# Patient Record
Sex: Male | Born: 1945 | Race: White | Hispanic: No | Marital: Married | Smoking: Never smoker
Health system: Southern US, Community
[De-identification: ages and names within clinical notes are randomized; demographics above are authoritative.]

## PROBLEM LIST (undated history)

## (undated) DIAGNOSIS — F32A Depression, unspecified: Secondary | ICD-10-CM

## (undated) DIAGNOSIS — C189 Malignant neoplasm of colon, unspecified: Secondary | ICD-10-CM

## (undated) DIAGNOSIS — Z978 Presence of other specified devices: Secondary | ICD-10-CM

## (undated) DIAGNOSIS — F419 Anxiety disorder, unspecified: Secondary | ICD-10-CM

## (undated) DIAGNOSIS — I499 Cardiac arrhythmia, unspecified: Secondary | ICD-10-CM

## (undated) DIAGNOSIS — F329 Major depressive disorder, single episode, unspecified: Secondary | ICD-10-CM

## (undated) DIAGNOSIS — K56609 Unspecified intestinal obstruction, unspecified as to partial versus complete obstruction: Secondary | ICD-10-CM

## (undated) DIAGNOSIS — I10 Essential (primary) hypertension: Secondary | ICD-10-CM

## (undated) DIAGNOSIS — Z87442 Personal history of urinary calculi: Secondary | ICD-10-CM

## (undated) DIAGNOSIS — I4891 Unspecified atrial fibrillation: Secondary | ICD-10-CM

## (undated) DIAGNOSIS — D649 Anemia, unspecified: Secondary | ICD-10-CM

## (undated) DIAGNOSIS — K5792 Diverticulitis of intestine, part unspecified, without perforation or abscess without bleeding: Secondary | ICD-10-CM

## (undated) HISTORY — DX: Malignant neoplasm of colon, unspecified: C18.9

## (undated) HISTORY — PX: HERNIA REPAIR: SHX51

## (undated) HISTORY — PX: ANKLE SURGERY: SHX546

## (undated) HISTORY — DX: Unspecified atrial fibrillation: I48.91

---
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
## 2005-03-28 ENCOUNTER — Emergency Department (HOSPITAL_COMMUNITY): Admission: EM | Admit: 2005-03-28 | Discharge: 2005-03-29 | Payer: Self-pay | Admitting: Emergency Medicine

## 2005-04-15 ENCOUNTER — Ambulatory Visit: Payer: Self-pay | Admitting: Gastroenterology

## 2005-04-26 ENCOUNTER — Ambulatory Visit: Payer: Self-pay | Admitting: Gastroenterology

## 2006-03-08 ENCOUNTER — Emergency Department (HOSPITAL_COMMUNITY): Admission: EM | Admit: 2006-03-08 | Discharge: 2006-03-08 | Payer: Self-pay | Admitting: Emergency Medicine

## 2014-02-25 ENCOUNTER — Telehealth: Payer: Self-pay | Admitting: Gastroenterology

## 2014-02-25 NOTE — Telephone Encounter (Signed)
Patient reports that he is having pain in his lower side that started on Saturday.  Pain radiates into his back.  He is having chills and some nausea.  He feels this may be kidney stone, but his urologist is recommending an office visit here first.  He denies diarrhea or constipation.  No APP appointments at all this week.  Dr. [name] Plan he has not been seen here since 2006.  Please advise

## 2014-02-25 NOTE — Telephone Encounter (Signed)
Patient notified of the recommendations.  

## 2014-02-25 NOTE — Telephone Encounter (Signed)
He should see his PCP, an urgent care center or ED to evaluate.

## 2015-04-21 ENCOUNTER — Encounter: Payer: Self-pay | Admitting: Gastroenterology

## 2016-06-07 HISTORY — PX: OTHER SURGICAL HISTORY: SHX169

## 2017-12-29 ENCOUNTER — Encounter (HOSPITAL_COMMUNITY): Payer: Self-pay | Admitting: Emergency Medicine

## 2017-12-29 ENCOUNTER — Emergency Department (HOSPITAL_COMMUNITY)
Admission: EM | Admit: 2017-12-29 | Discharge: 2017-12-29 | Disposition: A | Discharge status: Home / Self Care | Payer: 59 | Attending: Emergency Medicine | Admitting: Emergency Medicine

## 2017-12-29 ENCOUNTER — Other Ambulatory Visit: Payer: Self-pay

## 2017-12-29 DIAGNOSIS — R112 Nausea with vomiting, unspecified: Secondary | ICD-10-CM | POA: Insufficient documentation

## 2017-12-29 DIAGNOSIS — R197 Diarrhea, unspecified: Secondary | ICD-10-CM | POA: Insufficient documentation

## 2017-12-29 DIAGNOSIS — R1110 Vomiting, unspecified: Secondary | ICD-10-CM

## 2017-12-29 HISTORY — DX: Diverticulitis of intestine, part unspecified, without perforation or abscess without bleeding: K57.92

## 2017-12-29 LAB — CBC WITH DIFFERENTIAL/PLATELET
Basophils Absolute: 0 10*3/uL (ref 0.0–0.1)
Basophils Relative: 0 %
Eosinophils Absolute: 0 10*3/uL (ref 0.0–0.7)
Eosinophils Relative: 0 %
HCT: 48.7 % (ref 39.0–52.0)
Hemoglobin: 16.8 g/dL (ref 13.0–17.0)
Lymphocytes Relative: 20 %
Lymphs Abs: 1.3 10*3/uL (ref 0.7–4.0)
MCH: 31.2 pg (ref 26.0–34.0)
MCHC: 34.5 g/dL (ref 30.0–36.0)
MCV: 90.4 fL (ref 78.0–100.0)
Monocytes Absolute: 0.7 10*3/uL (ref 0.1–1.0)
Monocytes Relative: 10 %
Neutro Abs: 4.6 10*3/uL (ref 1.7–7.7)
Neutrophils Relative %: 70 %
Platelets: 285 10*3/uL (ref 150–400)
RBC: 5.39 MIL/uL (ref 4.22–5.81)
RDW: 12.7 % (ref 11.5–15.5)
WBC: 6.7 10*3/uL (ref 4.0–10.5)

## 2017-12-29 LAB — URINALYSIS, ROUTINE W REFLEX MICROSCOPIC
Bilirubin Urine: NEGATIVE
Glucose, UA: NEGATIVE mg/dL
Ketones, ur: NEGATIVE mg/dL
Leukocytes, UA: NEGATIVE
Nitrite: NEGATIVE
Protein, ur: 100 mg/dL — AB
Specific Gravity, Urine: 1.03 (ref 1.005–1.030)
pH: 5 (ref 5.0–8.0)

## 2017-12-29 LAB — LIPASE, BLOOD: Lipase: 29 U/L (ref 11–51)

## 2017-12-29 LAB — COMPREHENSIVE METABOLIC PANEL
ALT: 35 U/L (ref 0–44)
AST: 41 U/L (ref 15–41)
Albumin: 4.3 g/dL (ref 3.5–5.0)
Alkaline Phosphatase: 83 U/L (ref 38–126)
Anion gap: 13 (ref 5–15)
BUN: 45 mg/dL — ABNORMAL HIGH (ref 8–23)
CO2: 26 mmol/L (ref 22–32)
Calcium: 9.5 mg/dL (ref 8.9–10.3)
Chloride: 101 mmol/L (ref 98–111)
Creatinine, Ser: 1.46 mg/dL — ABNORMAL HIGH (ref 0.61–1.24)
GFR calc Af Amer: 54 mL/min — ABNORMAL LOW (ref >60)
GFR calc non Af Amer: 47 mL/min — ABNORMAL LOW (ref >60)
Glucose, Bld: 135 mg/dL — ABNORMAL HIGH (ref 70–99)
Potassium: 4.2 mmol/L (ref 3.5–5.1)
Sodium: 140 mmol/L (ref 135–145)
Total Bilirubin: 1.3 mg/dL — ABNORMAL HIGH (ref 0.3–1.2)
Total Protein: 8.1 g/dL (ref 6.5–8.1)

## 2017-12-29 MED ORDER — SODIUM CHLORIDE 0.9 % IV BOLUS
1000.0000 mL | Freq: Once | INTRAVENOUS | Status: AC
  Administered 2017-12-29: 1000 mL via INTRAVENOUS

## 2017-12-29 MED ORDER — LOPERAMIDE HCL 2 MG PO CAPS: 2.0000 mg | ORAL_CAPSULE | Freq: Four times a day (QID) | ORAL | 0 refills | Status: DC | PRN

## 2017-12-29 MED ORDER — SODIUM CHLORIDE 0.9 % IV SOLN: INTRAVENOUS | Status: DC

## 2017-12-29 MED ORDER — ONDANSETRON HCL 4 MG/2ML IJ SOLN: 4.0000 mg | Freq: Once | INTRAMUSCULAR | Status: DC

## 2017-12-29 MED ORDER — ONDANSETRON 8 MG PO TBDP: 8.0000 mg | ORAL_TABLET | Freq: Three times a day (TID) | ORAL | 0 refills | Status: DC | PRN

## 2017-12-29 NOTE — Discharge Instructions (Addendum)
Take the medications as prescribed, follow-up with your primary care doctor to make sure you are improving, return over the weekend if you start having worsening symptoms, fever, increasing abdominal pain

## 2017-12-29 NOTE — ED Triage Notes (Signed)
Pt sent from [facility] for possible dehydration from abd pains with n/v/d since Tuesday. Pt had blood and urine done at office with results in envelope-all normal.

## 2017-12-29 NOTE — ED Notes (Signed)
Pt is alert and oriented x 4 pt denies pain at this time and states nausea has subsided since he received Zofran at PCP office. Pt report having diarrhea  X 2 days/

## 2017-12-29 NOTE — ED Provider Notes (Addendum)
[facility] DEPT Provider Note   CSN: [id number] Arrival date & time: 12/29/17  1744     History   Chief Complaint Chief Complaint  Patient presents with  . Abdominal Pain  . Emesis  . Diarrhea    HPI Dave Vaughn is a 72 y.o. male.  HPI Pt started having nausea and vomiting on Tuesday.  Pt has vomited 3 times per day.  He has had 3-5 episodes of diarrhea per day.  No blood noted in the stool or vomit.  Pt states he also has had pain in his lower abdomen for a few weeks.  He was seen earlier in the week and had normal lab tests.  Sx persisted and he returned today to the office.   he was instructed to come to the ED.  No recent travel. No recent abx use.  Past Medical History:  Diagnosis Date  . Diverticulitis     There are no active problems to display for this patient.   Past Surgical History:  Procedure Laterality Date  . thumb surgery           Home Medications    Prior to Admission medications   Medication Sig Start Date End Date Taking? Authorizing Provider  ibuprofen (ADVIL,MOTRIN) 200 MG tablet Take 400 mg by mouth daily as needed for moderate pain.   Yes [provider]  Multiple Vitamin (MULTIVITAMIN) LIQD Take 5 mLs by mouth daily.   Yes [provider]  ondansetron (ZOFRAN) 4 MG tablet Take 4 mg by mouth daily as needed for nausea or vomiting.   Yes [provider]  loperamide (IMODIUM) 2 MG capsule Take 1 capsule (2 mg total) by mouth 4 (four) times daily as needed for diarrhea or loose stools. 12/29/17   Dorie Rank, MD  ondansetron (ZOFRAN ODT) 8 MG disintegrating tablet Take 1 tablet (8 mg total) by mouth every 8 (eight) hours as needed for nausea or vomiting. 12/29/17   Dorie Rank, MD    Family History No family history on file.  Social History Social History   Tobacco Use  . Smoking status: Never Smoker  . Smokeless tobacco: Never Used  Substance Use Topics  . Alcohol use: Yes  . Drug  use: Not on file     Allergies   Patient has no known allergies.   Review of Systems Review of Systems  All other systems reviewed and are negative.    Physical Exam Updated Vital Signs BP (!) 141/94   Pulse 98   Temp 98.6 F (37 C) (Oral)   Resp 18   Ht 1.803 m (5\' 11" )   Wt 94.8 kg (209 lb)   SpO2 98%   BMI 29.15 kg/m   Physical Exam  Constitutional: He appears well-developed and well-nourished. No distress.  HENT:  Head: Normocephalic and atraumatic.  Right Ear: External ear normal.  Left Ear: External ear normal.  Eyes: Conjunctivae are normal. Right eye exhibits no discharge. Left eye exhibits no discharge. No scleral icterus.  Neck: Neck supple. No tracheal deviation present.  Cardiovascular: Normal rate, regular rhythm and intact distal pulses.  Pulmonary/Chest: Effort normal and breath sounds normal. No stridor. No respiratory distress. He has no wheezes. He has no rales.  Abdominal: Soft. Bowel sounds are normal. He exhibits no distension. There is no tenderness. There is no rebound and no guarding.  Musculoskeletal: He exhibits no edema or tenderness.  Neurological: He is alert. He has normal strength. No cranial nerve deficit (  no facial droop, extraocular movements intact, no slurred speech) or sensory deficit. He exhibits normal muscle tone. He displays no seizure activity. Coordination normal.  Skin: Skin is warm and dry. No rash noted.  Psychiatric: He has a normal mood and affect.  Nursing note and vitals reviewed.    ED Treatments / Results  Labs (all labs ordered are listed, but only abnormal results are displayed) Labs Reviewed  COMPREHENSIVE METABOLIC PANEL - Abnormal; Notable for the following components:      Result Value   Glucose, Bld 135 (*)    BUN 45 (*)    Creatinine, Ser 1.46 (*)    Total Bilirubin 1.3 (*)    GFR calc non Af Amer 47 (*)    GFR calc Af Amer 54 (*)    All other components within normal limits  URINALYSIS, ROUTINE W  REFLEX MICROSCOPIC - Abnormal; Notable for the following components:   Color, Urine AMBER (*)    APPearance CLOUDY (*)    Hgb urine dipstick MODERATE (*)    Protein, ur 100 (*)    Bacteria, UA RARE (*)    All other components within normal limits  LIPASE, BLOOD  CBC WITH DIFFERENTIAL/PLATELET    EKG None  Radiology No results found.  Procedures Procedures (including critical care time)  Medications Ordered in ED Medications  sodium chloride 0.9 % bolus 1,000 mL (0 mLs Intravenous Stopped 12/29/17 2039)    And  0.9 %  sodium chloride infusion (has no administration in time range)  ondansetron (ZOFRAN) injection 4 mg (0 mg Intravenous Hold 12/29/17 1924)  sodium chloride 0.9 % bolus 1,000 mL (1,000 mLs Intravenous New Bag/Given 12/29/17 2030)     Initial Impression / Assessment and Plan / ED Course  I have reviewed the triage vital signs and the nursing notes.  Pertinent labs & imaging results that were available during my care of the patient were reviewed by me and considered in my medical decision making (see chart for details).  Clinical Course as of Dec 30 2138  Thu Dec 29, 2017  2139 Patient's laboratory tests are notable for mild acute kidney injury.  Likely related to dehydration.   [JK]  2139   He does not have any leukocytosis.  No anemia.  Urinalysis does not suggest UTI.   [JK]    Clinical Course User Index [JK] Dorie Rank, MD   Patient presented to the emergency room for evaluation of vomiting, diarrhea and abdominal pain.  Repeat serial exams patient had no abdominal tenderness.  Laboratory tests are reassuring.  I suspect he is had a viral illness resulting in dehydration.  Patient was given a couple liters of fluids.  He is feeling better.  Plan on discharge home with antinausea medications and antidiarrheal.  Discussed follow-up with his primary care doctor and warning signs and precautions to return to the ED.  Final Clinical Impressions(s) / ED Diagnoses    Final diagnoses:  Vomiting and diarrhea    ED Discharge Orders        Ordered    ondansetron (ZOFRAN ODT) 8 MG disintegrating tablet  Every 8 hours PRN     12/29/17 2020    loperamide (IMODIUM) 2 MG capsule  4 times daily PRN     12/29/17 2020       Dorie Rank, MD 12/29/17 2140 [place] addendum   Dorie Rank, MD 01/10/18 1743

## 2018-01-10 ENCOUNTER — Emergency Department (HOSPITAL_BASED_OUTPATIENT_CLINIC_OR_DEPARTMENT_OTHER): Payer: 59

## 2018-01-10 ENCOUNTER — Other Ambulatory Visit: Payer: Self-pay

## 2018-01-10 ENCOUNTER — Encounter: Payer: Self-pay | Admitting: Gastroenterology

## 2018-01-10 ENCOUNTER — Encounter (HOSPITAL_BASED_OUTPATIENT_CLINIC_OR_DEPARTMENT_OTHER): Payer: Self-pay | Admitting: *Deleted

## 2018-01-10 ENCOUNTER — Inpatient Hospital Stay (HOSPITAL_BASED_OUTPATIENT_CLINIC_OR_DEPARTMENT_OTHER)
Admission: EM | Admit: 2018-01-10 | Discharge: 2018-01-17 | DRG: 329 | Disposition: A | Discharge status: Home Health | Payer: 59 | Attending: Internal Medicine | Admitting: Internal Medicine

## 2018-01-10 DIAGNOSIS — Z7982 Long term (current) use of aspirin: Secondary | ICD-10-CM

## 2018-01-10 DIAGNOSIS — K648 Other hemorrhoids: Secondary | ICD-10-CM | POA: Diagnosis present

## 2018-01-10 DIAGNOSIS — I480 Paroxysmal atrial fibrillation: Secondary | ICD-10-CM

## 2018-01-10 DIAGNOSIS — Z79899 Other long term (current) drug therapy: Secondary | ICD-10-CM

## 2018-01-10 DIAGNOSIS — I4891 Unspecified atrial fibrillation: Secondary | ICD-10-CM | POA: Diagnosis not present

## 2018-01-10 DIAGNOSIS — E43 Unspecified severe protein-calorie malnutrition: Secondary | ICD-10-CM

## 2018-01-10 DIAGNOSIS — I482 Chronic atrial fibrillation, unspecified: Secondary | ICD-10-CM | POA: Diagnosis present

## 2018-01-10 DIAGNOSIS — K56609 Unspecified intestinal obstruction, unspecified as to partial versus complete obstruction: Secondary | ICD-10-CM | POA: Diagnosis present

## 2018-01-10 DIAGNOSIS — I340 Nonrheumatic mitral (valve) insufficiency: Secondary | ICD-10-CM | POA: Diagnosis present

## 2018-01-10 DIAGNOSIS — E860 Dehydration: Secondary | ICD-10-CM | POA: Diagnosis not present

## 2018-01-10 DIAGNOSIS — N183 Chronic kidney disease, stage 3 (moderate): Secondary | ICD-10-CM | POA: Diagnosis present

## 2018-01-10 DIAGNOSIS — D62 Acute posthemorrhagic anemia: Secondary | ICD-10-CM | POA: Diagnosis not present

## 2018-01-10 DIAGNOSIS — E871 Hypo-osmolality and hyponatremia: Secondary | ICD-10-CM | POA: Diagnosis not present

## 2018-01-10 DIAGNOSIS — E876 Hypokalemia: Secondary | ICD-10-CM | POA: Diagnosis not present

## 2018-01-10 DIAGNOSIS — Z6827 Body mass index (BMI) 27.0-27.9, adult: Secondary | ICD-10-CM

## 2018-01-10 DIAGNOSIS — C186 Malignant neoplasm of descending colon: Secondary | ICD-10-CM | POA: Diagnosis present

## 2018-01-10 DIAGNOSIS — K5730 Diverticulosis of large intestine without perforation or abscess without bleeding: Secondary | ICD-10-CM | POA: Diagnosis present

## 2018-01-10 DIAGNOSIS — K56691 Other complete intestinal obstruction: Secondary | ICD-10-CM | POA: Diagnosis present

## 2018-01-10 HISTORY — DX: Unspecified intestinal obstruction, unspecified as to partial versus complete obstruction: K56.609

## 2018-01-10 LAB — URINALYSIS, ROUTINE W REFLEX MICROSCOPIC
Glucose, UA: NEGATIVE mg/dL
KETONES UR: 15 mg/dL — AB
Leukocytes, UA: NEGATIVE
Nitrite: NEGATIVE
pH: 6 (ref 5.0–8.0)

## 2018-01-10 LAB — URINALYSIS, MICROSCOPIC (REFLEX)

## 2018-01-10 LAB — COMPREHENSIVE METABOLIC PANEL
ALK PHOS: 141 U/L — AB (ref 38–126)
ALT: 85 U/L — AB (ref 0–44)
ANION GAP: 13 (ref 5–15)
AST: 42 U/L — ABNORMAL HIGH (ref 15–41)
Albumin: 4 g/dL (ref 3.5–5.0)
BILIRUBIN TOTAL: 1.2 mg/dL (ref 0.3–1.2)
BUN: 34 mg/dL — ABNORMAL HIGH (ref 8–23)
CALCIUM: 8.6 mg/dL — AB (ref 8.9–10.3)
CO2: 25 mmol/L (ref 22–32)
Chloride: 97 mmol/L — ABNORMAL LOW (ref 98–111)
Creatinine, Ser: 1.33 mg/dL — ABNORMAL HIGH (ref 0.61–1.24)
GFR, EST NON AFRICAN AMERICAN: 52 mL/min — AB (ref >60)
Glucose, Bld: 134 mg/dL — ABNORMAL HIGH (ref 70–99)
Potassium: 3.4 mmol/L — ABNORMAL LOW (ref 3.5–5.1)
Sodium: 135 mmol/L (ref 135–145)
TOTAL PROTEIN: 7.7 g/dL (ref 6.5–8.1)

## 2018-01-10 LAB — CBC WITH DIFFERENTIAL/PLATELET
Basophils Absolute: 0 10*3/uL (ref 0.0–0.1)
Basophils Relative: 1 %
Eosinophils Absolute: 0 10*3/uL (ref 0.0–0.7)
Eosinophils Relative: 0 %
HCT: 45 % (ref 39.0–52.0)
HEMOGLOBIN: 15.5 g/dL (ref 13.0–17.0)
LYMPHS ABS: 1.7 10*3/uL (ref 0.7–4.0)
LYMPHS PCT: 31 %
MCH: 30.2 pg (ref 26.0–34.0)
MCHC: 34.4 g/dL (ref 30.0–36.0)
MCV: 87.7 fL (ref 78.0–100.0)
MONOS PCT: 19 %
Monocytes Absolute: 1.1 10*3/uL — ABNORMAL HIGH (ref 0.1–1.0)
NEUTROS PCT: 49 %
Neutro Abs: 2.7 10*3/uL (ref 1.7–7.7)
Platelets: 305 10*3/uL (ref 150–400)
RBC: 5.13 MIL/uL (ref 4.22–5.81)
RDW: 12.2 % (ref 11.5–15.5)
WBC: 5.5 10*3/uL (ref 4.0–10.5)

## 2018-01-10 LAB — LIPASE, BLOOD: LIPASE: 36 U/L (ref 11–51)

## 2018-01-10 LAB — MAGNESIUM: Magnesium: 2.2 mg/dL (ref 1.7–2.4)

## 2018-01-10 MED ORDER — SODIUM CHLORIDE 0.9 % IV SOLN
INTRAVENOUS | Status: DC | PRN
  Administered 2018-01-10: 23:00:00 via INTRAVENOUS

## 2018-01-10 MED ORDER — POTASSIUM CHLORIDE 10 MEQ/100ML IV SOLN
10.0000 meq | INTRAVENOUS | Status: AC
  Administered 2018-01-10 (×2): 10 meq via INTRAVENOUS
  Filled 2018-01-10 (×2): qty 100

## 2018-01-10 MED ORDER — DILTIAZEM HCL 25 MG/5ML IV SOLN
10.0000 mg | Freq: Once | INTRAVENOUS | Status: AC
  Administered 2018-01-10: 10 mg via INTRAVENOUS
  Filled 2018-01-10: qty 5

## 2018-01-10 MED ORDER — SODIUM CHLORIDE 0.9 % IV BOLUS
1000.0000 mL | Freq: Once | INTRAVENOUS | Status: AC
  Administered 2018-01-10: 1000 mL via INTRAVENOUS

## 2018-01-10 MED ORDER — IOPAMIDOL (ISOVUE-300) INJECTION 61%
100.0000 mL | Freq: Once | INTRAVENOUS | Status: AC | PRN
  Administered 2018-01-10: 100 mL via INTRAVENOUS

## 2018-01-10 NOTE — ED Notes (Signed)
Patient transported to CT 

## 2018-01-10 NOTE — ED Triage Notes (Addendum)
Pt c/o abd pain , n/v/d, x 3 weeks seen last week at [facility] ed for same

## 2018-01-10 NOTE — Plan of Care (Addendum)
72 y.o. male history of diverticulitis here presenting with abdominal pain, vomiting Was seen last week thought had GI bug  CT showed colonic obstruction though due to mass or stricture, some nonspecific finding around apendix Spoke to [name] like y no true appendicitis,   Spoke to [name] with GI will scope tomorrow Hold off on NG tube, keep NPO rehydrate  LFT are up but no mets no hx of EtOH  Developed A.fib while in ER 110 new onset A.fib  Diltiazem 10 IV ordered  Change bed request to tele  Dave Vaughn 10:37 PM

## 2018-01-10 NOTE — ED Provider Notes (Addendum)
[facility] EMERGENCY DEPARTMENT Provider Note   CSN: [id number] Arrival date & time: 01/10/18  1820     History   Chief Complaint Chief Complaint  Patient presents with  . Abdominal Pain    HPI Dave Vaughn is a 72 y.o. male history of diverticulitis here presenting with abdominal pain, vomiting.  Patient states that he has been having poor appetite for the last 3 weeks.  Patient felt nauseated and had intermittent vomiting over the last several weeks. Patient was seen in the hospital about a week ago and was diagnosed with gastroenteritis but no imaging studies were done.  Over the last week, patient noticed that he has more abdominal distention.  He is constipated and had no bowel movements until this morning.  He noticed one normal bowel movement today.  He also had poor appetite but no vomiting.  Patient denies any fevers or chills or previous abdominal surgery.   The history is provided by the patient.    Past Medical History:  Diagnosis Date  . Diverticulitis     Patient Active Problem List   Diagnosis Date Noted  . Colonic obstruction ([place]) 01/10/2018  . Bowel obstruction ([place]) 01/10/2018    Past Surgical History:  Procedure Laterality Date  . thumb surgery           Home Medications    Prior to Admission medications   Medication Sig Start Date End Date Taking? Authorizing Provider  ibuprofen (ADVIL,MOTRIN) 200 MG tablet Take 400 mg by mouth daily as needed for moderate pain.    [provider]  loperamide (IMODIUM) 2 MG capsule Take 1 capsule (2 mg total) by mouth 4 (four) times daily as needed for diarrhea or loose stools. 12/29/17   Dorie Rank, MD  Multiple Vitamin (MULTIVITAMIN) LIQD Take 5 mLs by mouth daily.    [provider]  ondansetron (ZOFRAN ODT) 8 MG disintegrating tablet Take 1 tablet (8 mg total) by mouth every 8 (eight) hours as needed for nausea or vomiting. 12/29/17   Dorie Rank, MD  ondansetron (ZOFRAN) 4 MG tablet  Take 4 mg by mouth daily as needed for nausea or vomiting.    [provider]    Family History History reviewed. No pertinent family history.  Social History Social History   Tobacco Use  . Smoking status: Never Smoker  . Smokeless tobacco: Never Used  Substance Use Topics  . Alcohol use: Yes  . Drug use: Not on file     Allergies   Patient has no known allergies.   Review of Systems Review of Systems  Gastrointestinal: Positive for abdominal pain and nausea.  All other systems reviewed and are negative.    Physical Exam Updated Vital Signs BP 114/75   Pulse 89   Temp 98.4 F (36.9 C) (Oral)   Resp 18   Ht 5\' 10"  (1.778 m)   Wt 88.5 kg (195 lb)   SpO2 96%   BMI 27.98 kg/m   Physical Exam  Constitutional: He is oriented to person, place, and time. He appears well-developed and well-nourished.  HENT:  Head: Normocephalic.  MM slightly dry   Eyes: Pupils are equal, round, and reactive to light. EOM are normal.  Cardiovascular: Normal rate, regular rhythm and normal heart sounds.  Pulmonary/Chest: Effort normal and breath sounds normal.  Abdominal:  Distended, + increased bowel sounds. Mild diffuse tenderness, no rebound   Neurological: He is alert and oriented to person, place, and time.  Skin: Skin is  warm. Capillary refill takes less than 2 seconds.  Psychiatric: He has a normal mood and affect. His behavior is normal.  Nursing note and vitals reviewed.    ED Treatments / Results  Labs (all labs ordered are listed, but only abnormal results are displayed) Labs Reviewed  CBC WITH DIFFERENTIAL/PLATELET - Abnormal; Notable for the following components:      Result Value   Monocytes Absolute 1.1 (*)    All other components within normal limits  COMPREHENSIVE METABOLIC PANEL - Abnormal; Notable for the following components:   Potassium 3.4 (*)    Chloride 97 (*)    Glucose, Bld 134 (*)    BUN 34 (*)    Creatinine, Ser 1.33 (*)    Calcium  8.6 (*)    AST 42 (*)    ALT 85 (*)    Alkaline Phosphatase 141 (*)    GFR calc non Af Amer 52 (*)    All other components within normal limits  URINALYSIS, ROUTINE W REFLEX MICROSCOPIC - Abnormal; Notable for the following components:   Color, Urine AMBER (*)    APPearance CLOUDY (*)    Specific Gravity, Urine >1.030 (*)    Hgb urine dipstick LARGE (*)    Bilirubin Urine MODERATE (*)    Ketones, ur 15 (*)    Protein, ur >300 (*)    All other components within normal limits  URINALYSIS, MICROSCOPIC (REFLEX) - Abnormal; Notable for the following components:   Bacteria, UA MANY (*)    All other components within normal limits  LIPASE, BLOOD  MAGNESIUM    EKG EKG Interpretation  Date/Time:  Tuesday January 10 2018 22:00:50 EDT Ventricular Rate:  122 PR Interval:    QRS Duration: 93 QT Interval:  331 QTC Calculation: 444 R Axis:   55 Text Interpretation:  Atrial fibrillation Low voltage, precordial leads Probable anteroseptal infarct, old Borderline repolarization abnormality Baseline wander in lead(s) II III aVL aVF V3 No previous ECGs available Confirmed by Wandra Arthurs [phone]) on 01/10/2018 10:04:10 PM   Radiology Ct Abdomen Pelvis W Contrast  Result Date: 01/10/2018 CLINICAL DATA:  Abdominal distention with nausea, vomiting and diarrhea 4 days. History of diverticulitis. EXAM: CT ABDOMEN AND PELVIS WITH CONTRAST TECHNIQUE: Multidetector CT imaging of the abdomen and pelvis was performed using the standard protocol following bolus administration of intravenous contrast. CONTRAST:  131mL ISOVUE-300 IOPAMIDOL (ISOVUE-300) INJECTION 61% COMPARISON:  02/26/2014 FINDINGS: Lower chest: No acute abnormality. Hepatobiliary: Subcentimeter hypodensity over the right lobe of the liver too small to characterize but likely a cyst. Biliary tree is within normal. Possible minimal cholelithiasis. Pancreas: Normal. Spleen: Normal. Adrenals/Urinary Tract: Adrenal glands are normal. Kidneys normal size  without hydronephrosis or nephrolithiasis. There is a stable 1.6 cm hypodensity over the lower pole right kidney unchanged and likely a slightly hyperdense cyst. Ureters are normal. Mild increased density over the posterior dependent portion of the bladder which is in continuity with the adjacent mildly prominent prostate gland likely representing prostatic impression upon the bladder base although it would be difficult to exclude a intrinsic bladder mass. Stomach/Bowel: Stomach is normal. There multiple fluid and air-filled dilated small bowel loops to the level of the ileocecal valve measuring up to 4.8 cm in diameter. There is mild fluid and air-filled prominence of the colon to the level of the mid descending colon with there is an abrupt caliber change and associated irregular soft tissue density suggesting neoplastic stricture. This is likely the site of obstruction. Moderate diverticulosis of  the distal descending and sigmoid colon. Appendix demonstrates mild mucosal enhancement and measures 1.2 cm in thickness at the tip. There is no adjacent inflammatory change or free fluid. Vascular/Lymphatic: Minimal atherosclerotic plaque over the abdominal aorta and iliac arteries. No adenopathy. Reproductive: Mild prostatic enlargement. Other: No significant free fluid or free peritoneal air. Musculoskeletal: Degenerative change of the spine and hips. Multilevel disc disease throughout the lumbar spine. IMPRESSION: Irregular soft tissue density causing stricture of the mid descending colon likely the site of obstruction for the dilated small bowel. 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 No evidence of perforation. Equivocal findings involving the appendix measuring 1.2 cm at the appendiceal tip with mucosal enhancement. No adjacent free fluid or inflammatory change. Findings are nonspecific, but can be seen with early acute appendicitis. Mild prostatic enlargement. Increased density over the posterior bladder base  likely due to the large prostatic impression although cannot completely exclude a bladder mass. Urology protocol CT or ultrasound may be helpful for better evaluation. Mild cholelithiasis. Stable 1.5 cm cystic structure over the lower pole right kidney likely slightly hyperdense cyst. Diverticulosis of the colon. Aortic Atherosclerosis (ICD10-I70.0). These results were called by telephone at the time of interpretation on 01/10/2018 at 8:25 pm to Dr. Shirlyn Goltz , who verbally acknowledged these results. Electronically Signed   By: Marin Olp M.D.   On: 01/10/2018 20:25    Procedures Procedures (including critical care time)    Medications Ordered in ED Medications  potassium chloride 10 mEq in 100 mL IVPB (0 mEq Intravenous Stopped 01/10/18 2310)  0.9 %  sodium chloride infusion (has no administration in time range)  sodium chloride 0.9 % bolus 1,000 mL (0 mLs Intravenous Stopped 01/10/18 2022)  iopamidol (ISOVUE-300) 61 % injection 100 mL (100 mLs Intravenous Contrast Given 01/10/18 1944)  sodium chloride 0.9 % bolus 1,000 mL (0 mLs Intravenous Stopped 01/10/18 2310)  diltiazem (CARDIZEM) injection 10 mg (10 mg Intravenous Given 01/10/18 2241)     Initial Impression / Assessment and Plan / ED Course  I have reviewed the triage vital signs and the nursing notes.  Pertinent labs & imaging results that were available during my care of the patient were reviewed by me and considered in my medical decision making (see chart for details).     Dave Vaughn is a 72 y.o. male here with abdominal distention, nausea. I think likely ileus vs SBO. Will get labs, CT ab/pel, UA. Will give IVF and reassess.    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 Also possible appendicitis. I talked to Dr. Excell Seltzer from surgery, who thinks the dilated appendix is likely from colonic obstruction and patient will need colonoscopy. Since patient isn't vomiting currently, can hold off  on NG tube. Patient had colonoscopy with Dr. [name] Plan in 2006. I called Dr. Silverio Decamp from [facility] who will see patient. Will admit to hospitalist service.   9:30 pm Patient was noted to be tachycardic. EKG showed afib, which is new for him. Denies any chest pain or SOB. HR around 110. Changed bed request to tele.   10:30 pm I talked to Dr. Roel Cluck regarding change in status. She requested cardizem 10 mg IV bolus and if BP is normal, he can be transferred to tele at [facility].   11:12 PM BP 114/75 currently, HR down to 89. Continues to be asymptomatic.   Final Clinical Impressions(s) / ED Diagnoses   Final diagnoses:  Colon obstruction [facility])    ED Discharge  Orders    None       Drenda Freeze, MD 01/10/18 2105    Drenda Freeze, MD 01/10/18 [phone]

## 2018-01-10 NOTE — ED Notes (Signed)
Put pt on monitor for potassium infusion and pt is found to be in A-fib, EDP notified, pt denies any hx and denies chest pain

## 2018-01-10 NOTE — ED Notes (Signed)
Called [facility] to have hospitalist paged again @ [facility].

## 2018-01-11 ENCOUNTER — Encounter (HOSPITAL_COMMUNITY): Payer: Self-pay | Admitting: General Surgery

## 2018-01-11 ENCOUNTER — Inpatient Hospital Stay (HOSPITAL_COMMUNITY): Payer: 59 | Admitting: Anesthesiology

## 2018-01-11 ENCOUNTER — Encounter (HOSPITAL_COMMUNITY)
Admission: EM | Disposition: A | Discharge status: Home Health | Payer: Self-pay | Source: Home / Self Care | Attending: Internal Medicine

## 2018-01-11 ENCOUNTER — Encounter (HOSPITAL_COMMUNITY): Payer: Self-pay | Admitting: Certified Registered Nurse Anesthetist

## 2018-01-11 DIAGNOSIS — K56609 Unspecified intestinal obstruction, unspecified as to partial versus complete obstruction: Secondary | ICD-10-CM

## 2018-01-11 DIAGNOSIS — K5730 Diverticulosis of large intestine without perforation or abscess without bleeding: Secondary | ICD-10-CM

## 2018-01-11 DIAGNOSIS — K648 Other hemorrhoids: Secondary | ICD-10-CM

## 2018-01-11 DIAGNOSIS — C186 Malignant neoplasm of descending colon: Principal | ICD-10-CM

## 2018-01-11 DIAGNOSIS — I482 Chronic atrial fibrillation, unspecified: Secondary | ICD-10-CM | POA: Diagnosis present

## 2018-01-11 HISTORY — PX: COLON RESECTION: SHX5231

## 2018-01-11 HISTORY — PX: COLONOSCOPY: SHX5424

## 2018-01-11 LAB — BASIC METABOLIC PANEL
Anion gap: 8 (ref 5–15)
BUN: 27 mg/dL — AB (ref 8–23)
CALCIUM: 8.5 mg/dL — AB (ref 8.9–10.3)
CO2: 25 mmol/L (ref 22–32)
CREATININE: 1.11 mg/dL (ref 0.61–1.24)
Chloride: 105 mmol/L (ref 98–111)
GFR calc non Af Amer: >60 mL/min (ref >60)
Glucose, Bld: 98 mg/dL (ref 70–99)
Potassium: 3.8 mmol/L (ref 3.5–5.1)
SODIUM: 138 mmol/L (ref 135–145)

## 2018-01-11 LAB — TYPE AND SCREEN
ABO/RH(D): O POS
ANTIBODY SCREEN: NEGATIVE

## 2018-01-11 LAB — ABO/RH: ABO/RH(D): O POS

## 2018-01-11 SURGERY — COLON RESECTION
Anesthesia: General

## 2018-01-11 SURGERY — COLONOSCOPY WITH PROPOFOL
Anesthesia: Monitor Anesthesia Care

## 2018-01-11 MED ORDER — SODIUM CHLORIDE 0.9 % IV SOLN
2.0000 g | INTRAVENOUS | Status: DC
  Filled 2018-01-11: qty 2

## 2018-01-11 MED ORDER — PROMETHAZINE HCL 25 MG/ML IJ SOLN: 6.2500 mg | INTRAMUSCULAR | Status: DC | PRN

## 2018-01-11 MED ORDER — DILTIAZEM LOAD VIA INFUSION
5.0000 mg | Freq: Once | INTRAVENOUS | Status: AC
  Administered 2018-01-11: 5 mg via INTRAVENOUS
  Filled 2018-01-11: qty 5

## 2018-01-11 MED ORDER — SODIUM CHLORIDE 0.9 % IV SOLN: INTRAVENOUS | Status: DC

## 2018-01-11 MED ORDER — PHENYLEPHRINE 40 MCG/ML (10ML) SYRINGE FOR IV PUSH (FOR BLOOD PRESSURE SUPPORT)
PREFILLED_SYRINGE | INTRAVENOUS | Status: DC | PRN
  Administered 2018-01-11 (×5): 80 ug via INTRAVENOUS

## 2018-01-11 MED ORDER — METHOCARBAMOL 500 MG IVPB - SIMPLE MED
INTRAVENOUS | Status: AC
  Administered 2018-01-11: 500 mg via INTRAVENOUS
  Filled 2018-01-11: qty 50

## 2018-01-11 MED ORDER — FENTANYL CITRATE (PF) 250 MCG/5ML IJ SOLN
INTRAMUSCULAR | Status: DC | PRN
  Administered 2018-01-11: 100 ug via INTRAVENOUS
  Administered 2018-01-11 (×3): 50 ug via INTRAVENOUS
  Administered 2018-01-11: 100 ug via INTRAVENOUS

## 2018-01-11 MED ORDER — SPOT INK MARKER SYRINGE KIT
PACK | SUBMUCOSAL | Status: DC | PRN
  Administered 2018-01-11: 4 mL via SUBMUCOSAL

## 2018-01-11 MED ORDER — METOPROLOL TARTRATE 5 MG/5ML IV SOLN
INTRAVENOUS | Status: DC | PRN
  Administered 2018-01-11 (×2): 1 mg via INTRAVENOUS

## 2018-01-11 MED ORDER — SODIUM CHLORIDE 0.9 % IV SOLN
INTRAVENOUS | Status: DC | PRN
  Administered 2018-01-11: 50 ug/min via INTRAVENOUS

## 2018-01-11 MED ORDER — FENTANYL CITRATE (PF) 250 MCG/5ML IJ SOLN
INTRAMUSCULAR | Status: AC
  Filled 2018-01-11: qty 5

## 2018-01-11 MED ORDER — HYDROMORPHONE HCL 1 MG/ML IJ SOLN
0.2500 mg | INTRAMUSCULAR | Status: DC | PRN
  Administered 2018-01-11 (×4): 0.5 mg via INTRAVENOUS

## 2018-01-11 MED ORDER — PROPOFOL 10 MG/ML IV BOLUS
INTRAVENOUS | Status: DC | PRN
  Administered 2018-01-11: 150 mg via INTRAVENOUS

## 2018-01-11 MED ORDER — SODIUM CHLORIDE 0.9 % IV SOLN
INTRAVENOUS | Status: DC | PRN
  Administered 2018-01-11: 21:00:00 via INTRAVENOUS

## 2018-01-11 MED ORDER — SODIUM CHLORIDE 0.9 % IV SOLN
INTRAVENOUS | Status: DC | PRN
  Administered 2018-01-11: 2 g via INTRAVENOUS

## 2018-01-11 MED ORDER — MENTHOL 3 MG MT LOZG: 1.0000 | LOZENGE | OROMUCOSAL | Status: DC | PRN

## 2018-01-11 MED ORDER — ENOXAPARIN SODIUM 40 MG/0.4ML ~~LOC~~ SOLN
40.0000 mg | SUBCUTANEOUS | Status: DC
  Administered 2018-01-12: 40 mg via SUBCUTANEOUS
  Filled 2018-01-11: qty 0.4

## 2018-01-11 MED ORDER — SPOT INK MARKER SYRINGE KIT
PACK | SUBMUCOSAL | Status: AC
  Filled 2018-01-11: qty 5

## 2018-01-11 MED ORDER — ROCURONIUM BROMIDE 10 MG/ML (PF) SYRINGE
PREFILLED_SYRINGE | INTRAVENOUS | Status: DC | PRN
  Administered 2018-01-11 (×2): 50 mg via INTRAVENOUS
  Administered 2018-01-11: 20 mg via INTRAVENOUS
  Administered 2018-01-11: 30 mg via INTRAVENOUS

## 2018-01-11 MED ORDER — LIDOCAINE 2% (20 MG/ML) 5 ML SYRINGE
INTRAMUSCULAR | Status: AC
  Filled 2018-01-11: qty 5

## 2018-01-11 MED ORDER — ACETAMINOPHEN 650 MG RE SUPP: 650.0000 mg | Freq: Four times a day (QID) | RECTAL | Status: DC | PRN

## 2018-01-11 MED ORDER — 0.9 % SODIUM CHLORIDE (POUR BTL) OPTIME
TOPICAL | Status: DC | PRN
  Administered 2018-01-11: 6000 mL

## 2018-01-11 MED ORDER — SUGAMMADEX SODIUM 200 MG/2ML IV SOLN
INTRAVENOUS | Status: DC | PRN
  Administered 2018-01-11: 400 mg via INTRAVENOUS

## 2018-01-11 MED ORDER — ENOXAPARIN SODIUM 40 MG/0.4ML ~~LOC~~ SOLN
40.0000 mg | SUBCUTANEOUS | Status: AC
  Administered 2018-01-11: 40 mg via SUBCUTANEOUS
  Filled 2018-01-11: qty 0.4

## 2018-01-11 MED ORDER — SODIUM CHLORIDE 0.9 % IV SOLN
INTRAVENOUS | Status: DC | PRN
  Administered 2018-01-11 (×2): via INTRAVENOUS

## 2018-01-11 MED ORDER — PHENOL 1.4 % MT LIQD: 1.0000 | OROMUCOSAL | Status: DC | PRN

## 2018-01-11 MED ORDER — ROCURONIUM BROMIDE 10 MG/ML (PF) SYRINGE
PREFILLED_SYRINGE | INTRAVENOUS | Status: AC
  Filled 2018-01-11: qty 10

## 2018-01-11 MED ORDER — ONDANSETRON HCL 4 MG PO TABS: 4.0000 mg | ORAL_TABLET | Freq: Four times a day (QID) | ORAL | Status: DC | PRN

## 2018-01-11 MED ORDER — DILTIAZEM HCL 100 MG IV SOLR
INTRAVENOUS | Status: DC | PRN
  Administered 2018-01-11: 5 mg/h via INTRAVENOUS

## 2018-01-11 MED ORDER — METHOCARBAMOL 500 MG IVPB - SIMPLE MED
500.0000 mg | Freq: Three times a day (TID) | INTRAVENOUS | Status: DC | PRN
  Administered 2018-01-11 – 2018-01-12 (×4): 500 mg via INTRAVENOUS
  Filled 2018-01-11 (×3): qty 500

## 2018-01-11 MED ORDER — MORPHINE SULFATE (PF) 2 MG/ML IV SOLN
2.0000 mg | INTRAVENOUS | Status: DC | PRN
  Administered 2018-01-11 (×2): 2 mg via INTRAVENOUS
  Filled 2018-01-11 (×2): qty 1

## 2018-01-11 MED ORDER — SODIUM CHLORIDE 0.9 % IV SOLN
1.0000 g | Freq: Two times a day (BID) | INTRAVENOUS | Status: DC
  Administered 2018-01-11: 1 g via INTRAVENOUS
  Filled 2018-01-11 (×2): qty 1

## 2018-01-11 MED ORDER — PROPOFOL 10 MG/ML IV BOLUS
INTRAVENOUS | Status: AC
  Filled 2018-01-11: qty 20

## 2018-01-11 MED ORDER — ONDANSETRON HCL 4 MG/2ML IJ SOLN
INTRAMUSCULAR | Status: AC
  Filled 2018-01-11: qty 2

## 2018-01-11 MED ORDER — ONDANSETRON HCL 4 MG/2ML IJ SOLN
4.0000 mg | Freq: Four times a day (QID) | INTRAMUSCULAR | Status: DC | PRN
  Administered 2018-01-11 – 2018-01-17 (×4): 4 mg via INTRAVENOUS
  Filled 2018-01-11 (×3): qty 2

## 2018-01-11 MED ORDER — SUGAMMADEX SODIUM 500 MG/5ML IV SOLN
INTRAVENOUS | Status: AC
  Filled 2018-01-11: qty 15

## 2018-01-11 MED ORDER — SODIUM CHLORIDE 0.9 % IV SOLN
2.0000 g | INTRAVENOUS | Status: DC
  Filled 2018-01-11 (×2): qty 2

## 2018-01-11 MED ORDER — MORPHINE SULFATE (PF) 2 MG/ML IV SOLN
2.0000 mg | INTRAVENOUS | Status: DC | PRN
  Administered 2018-01-11 – 2018-01-12 (×3): 4 mg via INTRAVENOUS
  Filled 2018-01-11 (×3): qty 2

## 2018-01-11 MED ORDER — METOPROLOL TARTRATE 5 MG/5ML IV SOLN
INTRAVENOUS | Status: AC
  Filled 2018-01-11: qty 5

## 2018-01-11 MED ORDER — SUCCINYLCHOLINE CHLORIDE 200 MG/10ML IV SOSY
PREFILLED_SYRINGE | INTRAVENOUS | Status: DC | PRN
  Administered 2018-01-11: 140 mg via INTRAVENOUS

## 2018-01-11 MED ORDER — FAMOTIDINE IN NACL 20-0.9 MG/50ML-% IV SOLN
20.0000 mg | INTRAVENOUS | Status: DC
  Administered 2018-01-12 – 2018-01-14 (×3): 20 mg via INTRAVENOUS
  Filled 2018-01-11 (×3): qty 50

## 2018-01-11 MED ORDER — LACTATED RINGERS IV SOLN
INTRAVENOUS | Status: DC
  Administered 2018-01-11 – 2018-01-13 (×5): via INTRAVENOUS

## 2018-01-11 MED ORDER — FENTANYL CITRATE (PF) 100 MCG/2ML IJ SOLN
INTRAMUSCULAR | Status: AC
  Filled 2018-01-11: qty 2

## 2018-01-11 MED ORDER — MORPHINE SULFATE (PF) 2 MG/ML IV SOLN
2.0000 mg | INTRAVENOUS | Status: AC
  Administered 2018-01-11: 2 mg via INTRAVENOUS
  Filled 2018-01-11: qty 1

## 2018-01-11 MED ORDER — DILTIAZEM HCL-DEXTROSE 100-5 MG/100ML-% IV SOLN (PREMIX)
5.0000 mg/h | INTRAVENOUS | Status: DC
  Administered 2018-01-12 – 2018-01-13 (×2): 5 mg/h via INTRAVENOUS
  Filled 2018-01-11 (×3): qty 100

## 2018-01-11 MED ORDER — HYDROMORPHONE HCL 1 MG/ML IJ SOLN
INTRAMUSCULAR | Status: AC
  Administered 2018-01-11: 0.5 mg via INTRAVENOUS
  Filled 2018-01-11: qty 2

## 2018-01-11 MED ORDER — DEXAMETHASONE SODIUM PHOSPHATE 10 MG/ML IJ SOLN
INTRAMUSCULAR | Status: DC | PRN
  Administered 2018-01-11: 10 mg via INTRAVENOUS

## 2018-01-11 MED ORDER — ACETAMINOPHEN 325 MG PO TABS
650.0000 mg | ORAL_TABLET | Freq: Four times a day (QID) | ORAL | Status: DC | PRN
  Administered 2018-01-14 (×2): 650 mg via ORAL
  Filled 2018-01-11 (×2): qty 2

## 2018-01-11 MED ORDER — METOPROLOL TARTRATE 5 MG/5ML IV SOLN: 2.5000 mg | Freq: Four times a day (QID) | INTRAVENOUS | Status: DC | PRN

## 2018-01-11 MED ORDER — DEXAMETHASONE SODIUM PHOSPHATE 10 MG/ML IJ SOLN
INTRAMUSCULAR | Status: AC
  Filled 2018-01-11: qty 1

## 2018-01-11 SURGICAL SUPPLY — 59 items
BARRIER SKIN 2 1/4 (WOUND CARE) ×3 IMPLANT
BARRIER SKIN OD1.75 2 1/4 FLNG (WOUND CARE) IMPLANT
BLADE EXTENDED COATED 6.5IN (ELECTRODE) ×3 IMPLANT
BRR SKN FLT 1.75X2.25 2 PC (WOUND CARE) ×2
CHLORAPREP W/TINT 26ML (MISCELLANEOUS) ×1 IMPLANT
COUNTER NEEDLE 20 DBL MAG RED (NEEDLE) ×3 IMPLANT
COVER MAYO STAND STRL (DRAPES) ×9 IMPLANT
COVER SURGICAL LIGHT HANDLE (MISCELLANEOUS) ×4 IMPLANT
DRAPE LAPAROSCOPIC ABDOMINAL (DRAPES) ×3 IMPLANT
DRAPE SURG IRRIG POUCH 19X23 (DRAPES) ×3 IMPLANT
DRAPE WARM FLUID 44X44 (DRAPE) IMPLANT
DRSG OPSITE POSTOP 4X10 (GAUZE/BANDAGES/DRESSINGS) IMPLANT
DRSG OPSITE POSTOP 4X12 (GAUZE/BANDAGES/DRESSINGS) ×1 IMPLANT
DRSG OPSITE POSTOP 4X6 (GAUZE/BANDAGES/DRESSINGS) IMPLANT
DRSG OPSITE POSTOP 4X8 (GAUZE/BANDAGES/DRESSINGS) IMPLANT
DRSG TELFA 3X8 NADH (GAUZE/BANDAGES/DRESSINGS) ×3 IMPLANT
ELECT PENCIL ROCKER SW 15FT (MISCELLANEOUS) ×6 IMPLANT
ELECT REM PT RETURN 15FT ADLT (MISCELLANEOUS) ×3 IMPLANT
GAUZE SPONGE 4X4 12PLY STRL (GAUZE/BANDAGES/DRESSINGS) ×3 IMPLANT
GLOVE BIOGEL PI IND STRL 7.0 (GLOVE) IMPLANT
GLOVE BIOGEL PI INDICATOR 7.0 (GLOVE) ×6
GLOVE EUDERMIC 7 POWDERFREE (GLOVE) ×7 IMPLANT
GLOVE INDICATOR 6.5 STRL GRN (GLOVE) ×4 IMPLANT
GLOVE SURG SS PI 7.0 STRL IVOR (GLOVE) ×2 IMPLANT
GOWN STRL REUS W/ TWL LRG LVL3 (GOWN DISPOSABLE) IMPLANT
GOWN STRL REUS W/TWL LRG LVL3 (GOWN DISPOSABLE) ×12
GOWN STRL REUS W/TWL XL LVL3 (GOWN DISPOSABLE) ×12 IMPLANT
LEGGING LITHOTOMY PAIR STRL (DRAPES) IMPLANT
LIGASURE IMPACT 36 18CM CVD LR (INSTRUMENTS) ×1 IMPLANT
MARKER SKIN DUAL TIP RULER LAB (MISCELLANEOUS) ×3 IMPLANT
PACK COLON (CUSTOM PROCEDURE TRAY) ×3 IMPLANT
PAD DRESSING TELFA 3X8 NADH (GAUZE/BANDAGES/DRESSINGS) IMPLANT
PAD POSITIONING PINK XL (MISCELLANEOUS) ×2 IMPLANT
POUCH OSTOMY 2 PC DRNBL 2.25 (WOUND CARE) IMPLANT
POUCH OSTOMY DRNBL 2 1/4 (WOUND CARE) ×3
RELOAD PROXIMATE 75MM BLUE (ENDOMECHANICALS) ×6 IMPLANT
RELOAD STAPLE 75 3.8 BLU REG (ENDOMECHANICALS) IMPLANT
SPONGE LAP 18X18 RF (DISPOSABLE) ×5 IMPLANT
STAPLER PROXIMATE 75MM BLUE (STAPLE) ×1 IMPLANT
STAPLER VISISTAT 35W (STAPLE) ×3 IMPLANT
SUT NOV 1 T60/GS (SUTURE) IMPLANT
SUT NOVA NAB DX-16 0-1 5-0 T12 (SUTURE) IMPLANT
SUT NOVA T20/GS 25 (SUTURE) IMPLANT
SUT PDS AB 1 CTX 36 (SUTURE) IMPLANT
SUT PDS AB 1 TP1 96 (SUTURE) ×2 IMPLANT
SUT PROLENE 0 SH 30 (SUTURE) ×1 IMPLANT
SUT PROLENE 2 0 KS (SUTURE) IMPLANT
SUT PROLENE 2 0 SH DA (SUTURE) IMPLANT
SUT SILK 0 SH 30 (SUTURE) ×1 IMPLANT
SUT SILK 2 0 (SUTURE) ×6
SUT SILK 2 0 SH CR/8 (SUTURE) ×3 IMPLANT
SUT SILK 2-0 18XBRD TIE 12 (SUTURE) ×2 IMPLANT
SUT SILK 3 0 (SUTURE) ×3
SUT SILK 3 0 SH CR/8 (SUTURE) ×3 IMPLANT
SUT SILK 3-0 18XBRD TIE 12 (SUTURE) ×2 IMPLANT
SUT VIC AB 3-0 SH 18 (SUTURE) ×1 IMPLANT
TOWEL OR 17X26 10 PK STRL BLUE (TOWEL DISPOSABLE) IMPLANT
TOWEL OR NON WOVEN STRL DISP B (DISPOSABLE) ×3 IMPLANT
TRAY FOLEY MTR SLVR 16FR STAT (SET/KITS/TRAYS/PACK) ×3 IMPLANT

## 2018-01-11 SURGICAL SUPPLY — 22 items

## 2018-01-11 NOTE — Interval H&P Note (Signed)
History and Physical Interval Note:  01/11/2018 12:08 PM  Dave Vaughn  has presented today for surgery, with the diagnosis of LEFT COLON OBSTRUCTION  The various methods of treatment have been discussed with the patient and family.        I have discussed the indications, details, techniques, and numerous risk of the surgery with the patient and his wife.  He is aware of the risk of bleeding, infection, wound healing problems such as dehiscence or infection or hernia, injury to adjacent organs with major reconstructive surgery, colostomy complications, cardiac pulmonary and thromboembolic problems.  He understands all of these issues well.  All of his questions were answered.  He agrees with this plan.        After consideration of risks, benefits and other options for treatment, the patient has consented to  Procedure(s): EXPLORATORY LAPAROTOMY PARTIAL COLECTOMY AND COLOSTOMY (N/A) COLONOSCOPY as a surgical intervention .  The patient's history has been reviewed, patient examined, no change in status, stable for surgery.  I have reviewed the patient's chart and labs.  Questions were answered to the patient's satisfaction.     Dave Vaughn

## 2018-01-11 NOTE — Consult Note (Signed)
[facility] Nurse ostomy consult note  [facility] Nurse requested for preoperative stoma site marking by Dr. [name].  Discussed surgical procedure and stoma creation with patient and family in the event one is needed intraoperatively.  Explained role of the [facility] nurse team.  Answered patient and family questions; they have a friend who has a temporary ostomy.   Examined patient sitting on the bedside commode in order to place the marking in the patient's visual field, away from any creases or abdominal contour issues and within the rectus muscle.    Marked for colostomy in the LLQ  5.5cm to the left of the umbilicus and 3cm below the umbilicus.  Patient's abdomen cleansed with CHG wipes at site markings, allowed to air dry prior to marking.Covered mark with thin film transparent dressing to preserve mark until surgery (today).   [facility] nursing team will follow to see if patient gets a stoma, and will remain available to this patient, the nursing and medical teams in that event.   Thank you, Maudie Flakes, MSN, RN, [facility], [facility]  Pager# [phone]

## 2018-01-11 NOTE — Progress Notes (Signed)
Dave Vaughn is a 72 y.o. male with medical history significant of diverticulitis.  Patient presents to ED at [facility] with c/o abd pain, vomiting.  Was seen last week for same symptoms, thought to be a GI bug at that time; however, symptoms have persisted so returns to ED. CT shows colonic obstruction though due to mass or stricture. Suspicion for neoplasia. GI following for possible colonoscopy and gen surgery for possible colectomy and colostomy within the next 24 hours. NG tube in place.  New onset A.Fib with rate 110. Oncardizem drip.  Please refer to H&P dictated on 01/11/2018 by Dr. Alcario Drought for further details of the assessment and plan.

## 2018-01-11 NOTE — Anesthesia Preprocedure Evaluation (Signed)
Anesthesia Evaluation  Patient identified by MRN, date of birth, ID band Patient awake    Reviewed: Allergy & Precautions, NPO status , Patient's Chart, lab work & pertinent test results  Airway Mallampati: II  TM Distance: >3 FB Neck ROM: Full    Dental no notable dental hx.    Pulmonary neg pulmonary ROS,    Pulmonary exam normal breath sounds clear to auscultation       Cardiovascular Normal cardiovascular exam+ dysrhythmias Atrial Fibrillation  Rhythm:Regular Rate:Normal  New onset afib   Neuro/Psych negative neurological ROS  negative psych ROS   GI/Hepatic negative GI ROS, Neg liver ROS,   Endo/Other  negative endocrine ROS  Renal/GU negative Renal ROS  negative genitourinary   Musculoskeletal negative musculoskeletal ROS (+)   Abdominal   Peds negative pediatric ROS (+)  Hematology negative hematology ROS (+)   Anesthesia Other Findings   Reproductive/Obstetrics negative OB ROS                             Anesthesia Physical Anesthesia Plan  ASA: III  Anesthesia Plan: General   Post-op Pain Management:    Induction: Intravenous and Rapid sequence  PONV Risk Score and Plan: 2 and Ondansetron, Dexamethasone and Treatment may vary due to age or medical condition  Airway Management Planned: Oral ETT  Additional Equipment:   Intra-op Plan:   Post-operative Plan: Extubation in OR  Informed Consent: I have reviewed the patients History and Physical, chart, labs and discussed the procedure including the risks, benefits and alternatives for the proposed anesthesia with the patient or authorized representative who has indicated his/her understanding and acceptance.   Dental advisory given  Plan Discussed with: CRNA and Surgeon  Anesthesia Plan Comments:         Anesthesia Quick Evaluation

## 2018-01-11 NOTE — Op Note (Addendum)
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 [name], M.D., FACS  Assistant:                   Margie Billet, PA   Indication for Assistant: Obese patient with massive intestinal distention.  Assistant indicated to assist with exposure to facilitate safe procedure  Operative Indications:   This is a reasonably healthy 72 year old man who has a 3 to 4-week history of gradually developing obstructive symptoms.  Most recently he became distended and began vomiting.  Emergency room evaluation revealed new onset atrial fibrillation which is being managed by the medical service.  CT scan showed a small mass in the mid to proximal descending colon with massive colonic and small bowel dilatation.  Nasogastric tube was placed.  Flexible sigmoidoscopy was performed by Dr. [name] this morning and he found an apple core neoplastic mass at about 50 cm.  This was tattooed.  He was immediately operated upon following the colonoscopy  Operative Findings:       There was an obstructing neoplastic mass in the mid to proximal descending colon.  There was some chronic diverticulosis of the rectosigmoid.  The tattoo was just distal to the palpable mass.  There was no gross evidence of adenopathy or spread to the liver.  During the dissection the tumor fractured slightly and a little bit of light tan-colored stool spilled out which was immediately evacuated and washed out thoroughly. I transected the colon at least 6 or 7 inches distal to the tumor.  When I transected the colon proximally it only looked like about 3 cm so I took a second specimen that was about 5 cm in length and marked a new proximal margin with a silk suture.  The distal  rectosigmoid  segment that was stapled off was probably at least 12 inches above the peritoneal reflection.  The sigmoid stump was marked with 2 Prolene sutures.  Procedure in Detail:     Following the induction of general endotracheal  Anesthesia Dr. [name] performed colonoscopy which will be dictated separately.  After the colonoscopy the patient was placed on the operating table.  His abdomen was prepped and draped in sterile fashion.  Intravenous antibiotics were given.  Surgical timeout was performed. Midline laparotomy incision was made.  The fascia was incised in the midline.  Self-retaining retractors were placed.  The abdomen was explored with findings as described above.  The nasogastric tube was positioned in the stomach.  Because of the massive distention I milked all the small bowel content back into the stomach and it was evacuated.  This helped with exposure somewhat.  I mobilized the entire descending colon by dividing its lateral peritoneal attachments.  Very carefully mobilized it up toward the midline.  I created a window in the colon mesentery about 7 inches distal to the tumor.  The colon was transected at this point with a GIA stapling device.  I mobilized the descending colon  and splenic flexure by taking down the lateral peritoneal attachments.  Small blood vessels were controlled with the LigaSure device.  One large blood vessel was controlled with a 2-0 silk tie.  Once I took the splenic flexure down I went all the way back to the transverse colon.  I transected the colon proximal to the tumor and marked the proximal margin.  When I inspected the specimen it only looked like I had about 3 cm proximal margin.  I then resected about another 5 cm of left transverse colon and marked a new proximal margin with a silk suture.        The spleen, pancreas, and stomach were inspected and looked good.  There was no injury.  I selected an area for the colostomy in the left rectus sheath and this  needed to be above the umbilicus but was a good 6 inches below the costal margin.  A circular button of skin was excised.  The subcutaneous fat was debrided.  The anterior rectus sheath was incised in a cruciate fashion.  The rectus muscle was spread and the posterior rectus sheath incised with the cautery until I could pass almost 3 fingers through this.  The distended transverse colon was brought up through the colostomy site and I was able to pull up about 3 or 4 inches of very pink and viable colon through the colostomy.  I inspected this carefully on 2 occasions to make sure there was no twisting.  The upper abdomen lower abdomen and pelvis were irrigated with saline.  There was no bleeding.  3 or 4 L were used to irrigate.  The NG tube was repositioned and taped in place.  The midline fascia was closed with a running suture of #1 double-stranded PDS.  This wound was irrigated and the skin closed loosely with staples and Telfa wicks.  Honeycomb bandage was placed.  The staple line on the colostomy was amputated revealing healthy pink mucosa and excellent bleeding.  The colostomy was matured with numerous sutures of 3-0 Vicryl.  A colostomy appliance was placed.  The patient tolerated the procedure well was taken to PACU in stable condition.  EBL 150 cc.  Counts correct.  Complications none.     Edsel Petrin. [name], M.D., FACS General and Minimally Invasive Surgery Breast and Colorectal Surgery  01/11/2018 4:23 PM

## 2018-01-11 NOTE — Consult Note (Addendum)
Consultation  Referring Provider: Dr. [name] Primary Care Physician:  [name], FNP Primary Gastroenterologist: Dr. [name] Plan        Reason for Consultation: Colon obstruction, suspected colon cancer             HPI:   Dave Vaughn is a 72 y.o. male with a past medical history as listed below, who presented to the ER in the 01/10/2018 with a complaint of 3 weeks of nausea, vomiting and diarrhea.    Today, explains he went to the urgent care initially on 12/26/2017 and had labs drawn there that were normal, he then developed nausea and vomiting as well as diarrhea and was diagnosed with a viral gastroenteritis.  It continued over the next week and he was seen again there and told it was gas and given Gas-X, again this did not help his symptoms.  He has not able to eat much over the past 3 weeks with intermittent nausea and vomiting, but has been able to keep liquids down.  Earlier this week he started getting abdominal distention and bloating and his diarrhea stopped 3 days ago.  His last bowel movement was very soft yesterday, no gas or stool since.  Due to increased abdominal distention he was finally brought to the ER again and had CT with findings as below.  Associated symptoms include a weight loss of about 20 pounds over the past 3 weeks.    Denies fever, chills, blood in his stool, melena, heartburn or reflux.  ED course: CT scan with a descending colon mass with almost complete obstruction, significant small bowel distention; new onset A. Fib  Previous GI history: Colonoscopy with Dr. [name] Plan 13 years ago-we do not have report of this in our system  Past Medical History:  Diagnosis Date  . Diverticulitis     Past Surgical History:  Procedure Laterality Date  . ANKLE SURGERY     when he was in college  . thumb surgery       Family history: No family history of IBD or colon cancer  Social History   Tobacco Use  . Smoking status: Never Smoker  . Smokeless tobacco: Never  Used  Substance Use Topics  . Alcohol use: Yes    Comment: drinks socially about 2x/week  . Drug use: Not on file    Prior to Admission medications   Medication Sig Start Date End Date Taking? Authorizing Provider  aspirin EC 81 MG tablet Take 81 mg by mouth daily.   Yes [provider]  loperamide (IMODIUM) 2 MG capsule Take 1 capsule (2 mg total) by mouth 4 (four) times daily as needed for diarrhea or loose stools. 12/29/17  Yes Dorie Rank, MD  Multiple Vitamin (MULTIVITAMIN WITH MINERALS) TABS tablet Take 1 tablet by mouth daily.   Yes [provider]  ondansetron (ZOFRAN ODT) 8 MG disintegrating tablet Take 1 tablet (8 mg total) by mouth every 8 (eight) hours as needed for nausea or vomiting. 12/29/17  Yes Dorie Rank, MD  simethicone (MYLICON) 599 MG chewable tablet Chew 125 mg by mouth every 6 (six) hours as needed for flatulence.   Yes [provider]    Current Facility-Administered Medications  Medication Dose Route Frequency Provider Last Rate Last Dose  . 0.9 %  sodium chloride infusion   Intravenous PRN Etta Quill, DO 125 mL/hr at 01/11/18 0154    . acetaminophen (TYLENOL) tablet 650 mg  650 mg Oral Q6H PRN Jennette Kettle  M, DO       Or  . acetaminophen (TYLENOL) suppository 650 mg  650 mg Rectal Q6H PRN Etta Quill, DO      . metoprolol tartrate (LOPRESSOR) injection 2.5-5 mg  2.5-5 mg Intravenous Q6H PRN Etta Quill, DO      . morphine 2 MG/ML injection 2-4 mg  2-4 mg Intravenous Q4H PRN Etta Quill, DO      . ondansetron [facility]) tablet 4 mg  4 mg Oral Q6H PRN Etta Quill, DO       Or  . ondansetron [facility]) injection 4 mg  4 mg Intravenous Q6H PRN Etta Quill, DO        Allergies as of 01/10/2018  . (No Known Allergies)     Review of Systems:    Constitutional: No weight loss, fever or chills Skin: No rash  Cardiovascular: No chest pain Respiratory: No SOB Gastrointestinal: See HPI and otherwise  negative Genitourinary: No dysuria  Neurological: No headache, dizziness or syncope Musculoskeletal: No new muscle or joint pain Hematologic: No bleeding Psychiatric: No history of depression or anxiety    Physical Exam:  Vital signs in last 24 hours: Temp:  [98.4 F (36.9 C)-98.6 F (37 C)] 98.4 F (36.9 C) (08/07 0549) Pulse Rate:  [67-110] 100 (08/07 0549) Resp:  [16-22] 18 (08/07 0549) BP: (100-127)/(71-98) 100/74 (08/07 0549) SpO2:  [95 %-98 %] 96 % (08/07 0549) Weight:  [195 lb (88.5 kg)] 195 lb (88.5 kg) (08/06 1822) Last BM Date: 01/10/18 General:   Pleasant Caucasian male appears to be in NAD, Well developed, Well nourished, alert and cooperative Head:  Normocephalic and atraumatic.+ NG tube in place pulling brownish liquid Eyes:   PEERL, EOMI. No icterus. Conjunctiva pink. Ears:  Normal auditory acuity. Neck:  Supple Throat: Oral cavity and pharynx without inflammation, swelling or lesion.  Lungs: Respirations even and unlabored. Lungs clear to auscultation bilaterally.   No wheezes, crackles, or rhonchi.  Heart: Normal S1, S2. No MRG. Regular rate and rhythm. No peripheral edema, cyanosis or pallor.  Abdomen: Tight/tense, marked distention, mild generalized discomfort to ttp. No rebound or guarding.  Decreased bowel sounds all 4 quadrants no appreciable masses or hepatomegaly. Rectal:  Not performed.  Msk:  Symmetrical without gross deformities. Peripheral pulses intact.  Extremities:  Without edema, no deformity or joint abnormality. Neurologic:  Alert and  oriented x4;  grossly normal neurologically.  Skin:   Dry and intact without significant lesions or rashes. Psychiatric: Demonstrates good judgement and reason without abnormal affect or behaviors.   LAB RESULTS: Recent Labs    01/10/18 1844  WBC 5.5  HGB 15.5  HCT 45.0  PLT 305   BMET Recent Labs    01/10/18 1844 01/11/18 0435  NA 135 138  K 3.4* 3.8  CL 97* 105  CO2 25 25  GLUCOSE 134* 98  BUN  34* 27*  CREATININE 1.33* 1.11  CALCIUM 8.6* 8.5*   LFT Recent Labs    01/10/18 1844  PROT 7.7  ALBUMIN 4.0  AST 42*  ALT 85*  ALKPHOS 141*  BILITOT 1.2   STUDIES: Ct Abdomen Pelvis W Contrast  Result Date: 01/10/2018 CLINICAL DATA:  Abdominal distention with nausea, vomiting and diarrhea 4 days. History of diverticulitis. EXAM: CT ABDOMEN AND PELVIS WITH CONTRAST TECHNIQUE: Multidetector CT imaging of the abdomen and pelvis was performed using the standard protocol following bolus administration of intravenous contrast. CONTRAST:  116mL ISOVUE-300 IOPAMIDOL (ISOVUE-300) INJECTION 61% COMPARISON:  02/26/2014 FINDINGS: Lower chest: No acute abnormality. Hepatobiliary: Subcentimeter hypodensity over the right lobe of the liver too small to characterize but likely a cyst. Biliary tree is within normal. Possible minimal cholelithiasis. Pancreas: Normal. Spleen: Normal. Adrenals/Urinary Tract: Adrenal glands are normal. Kidneys normal size without hydronephrosis or nephrolithiasis. There is a stable 1.6 cm hypodensity over the lower pole right kidney unchanged and likely a slightly hyperdense cyst. Ureters are normal. Mild increased density over the posterior dependent portion of the bladder which is in continuity with the adjacent mildly prominent prostate gland likely representing prostatic impression upon the bladder base although it would be difficult to exclude a intrinsic bladder mass. Stomach/Bowel: Stomach is normal. There multiple fluid and air-filled dilated small bowel loops to the level of the ileocecal valve measuring up to 4.8 cm in diameter. There is mild fluid and air-filled prominence of the colon to the level of the mid descending colon with there is an abrupt caliber change and associated irregular soft tissue density suggesting neoplastic stricture. This is likely the site of obstruction. Moderate diverticulosis of the distal descending and sigmoid colon. Appendix demonstrates mild  mucosal enhancement and measures 1.2 cm in thickness at the tip. There is no adjacent inflammatory change or free fluid. Vascular/Lymphatic: Minimal atherosclerotic plaque over the abdominal aorta and iliac arteries. No adenopathy. Reproductive: Mild prostatic enlargement. Other: No significant free fluid or free peritoneal air. Musculoskeletal: Degenerative change of the spine and hips. Multilevel disc disease throughout the lumbar spine. IMPRESSION: Irregular soft tissue density causing stricture of the mid descending colon likely the site of obstruction for the dilated small bowel. 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 No evidence of perforation. Equivocal findings involving the appendix measuring 1.2 cm at the appendiceal tip with mucosal enhancement. No adjacent free fluid or inflammatory change. Findings are nonspecific, but can be seen with early acute appendicitis. Mild prostatic enlargement. Increased density over the posterior bladder base likely due to the large prostatic impression although cannot completely exclude a bladder mass. Urology protocol CT or ultrasound may be helpful for better evaluation. Mild cholelithiasis. Stable 1.5 cm cystic structure over the lower pole right kidney likely slightly hyperdense cyst. Diverticulosis of the colon. Aortic Atherosclerosis (ICD10-I70.0). These results were called by telephone at the time of interpretation on 01/10/2018 at 8:25 pm to Dr. Shirlyn Goltz , who verbally acknowledged these results. Electronically Signed   By: Marin Olp M.D.   On: 01/10/2018 20:25     Impression / Plan:   Impression: 1.  Colonic obstruction secondary to descending colon mass: Surgery has consulted [place] in hopes that we will do a colonoscopy to further evaluate the mass prior to surgery tomorrow, CEA pending, plan is to then undergo surgical resection with colostomy given his obstructive pattern the NG tube has already been placed 2.  New onset A. fib  Plan:  1.  Discussed case  with Dr. [name] this morning.  He reviewed CT findings and is going to discuss the case with Dr. [name].  Discussing possible colonoscopy today with enemas prior versus foregoing this and just proceeding with surgery tomorrow. 2.  Continue other supportive measures 3.  Please await further recommendations from Dr. [name] later today.  Thank you for your kind consultation, we will continue to follow.  Lavone Nian [facility]  01/11/2018, 8:41 AM   Attending physician's note   I have taken an interval history, reviewed the chart and examined the patient. I agree with the Advanced Practitioner's note, impression and recommendations.  72 year old with colonic obstruction due to descending colonic mass.  CT scan reviewed independently and with the patient's family.  Discussed with Dr. [name].  Proceed with colonoscopy after prepping him with enemas for a definitive diagnosis.  This will be done in the OR.  Carmell Austria, MD

## 2018-01-11 NOTE — Transfer of Care (Signed)
Immediate Anesthesia Transfer of Care Note  Patient: Dave Vaughn  Procedure(s) Performed: LEFT COLON RESECTION, TAKEDOWN SPLENIC FLEXURE, COLOSTOMY (N/A ) COLONOSCOPY  Patient Location: PACU  Anesthesia Type:General  Level of Consciousness: awake, alert , oriented and patient cooperative  Airway & Oxygen Therapy: Patient Spontanous Breathing and Patient connected to face mask oxygen  Post-op Assessment: Report given to RN, Post -op Vital signs reviewed and stable and Patient moving all extremities  Post vital signs: Reviewed and stable  Last Vitals:  Vitals Value Taken Time  BP 123/99 01/11/2018  4:26 PM  Temp    Pulse 106 01/11/2018  4:30 PM  Resp 14 01/11/2018  4:30 PM  SpO2 100 % 01/11/2018  4:30 PM  Vitals shown include unvalidated device data.  Last Pain:  Vitals:   01/11/18 1217  TempSrc: Oral  PainSc:          Complications: No apparent anesthesia complications

## 2018-01-11 NOTE — Anesthesia Procedure Notes (Signed)
Procedure Name: Intubation Date/Time: 01/11/2018 1:26 PM Performed by: Mitzie Na, CRNA Pre-anesthesia Checklist: Patient identified, Emergency Drugs available, Suction available, Patient being monitored and Timeout performed Patient Re-evaluated:Patient Re-evaluated prior to induction Oxygen Delivery Method: Circle system utilized Preoxygenation: Pre-oxygenation with 100% oxygen Induction Type: Rapid sequence, Cricoid Pressure applied and IV induction Laryngoscope Size: Glidescope Grade View: Grade I Tube type: Oral Tube size: 7.5 mm Number of attempts: 1 Airway Equipment and Method: Stylet Placement Confirmation: ETT inserted through vocal cords under direct vision,  positive ETCO2 and breath sounds checked- equal and bilateral Secured at: 23 cm Dental Injury: Teeth and Oropharynx as per pre-operative assessment

## 2018-01-11 NOTE — Op Note (Signed)
[facility] Patient Name: Dave Vaughn Procedure Date: 01/11/2018 MRN: [id number] Attending MD: Jackquline Denmark , MD Date of Birth: 1945/08/31 CSN: [id number] Age: 72 Admit Type: Outpatient Procedure:                Colonoscopy Indications:              Abnormal CT Showing complete obstructive descending                            colonic lesion. Providers:                Jackquline Denmark, MD, Cleda Daub, RN, Charolette Child,                            Technician Referring MD:              Medicines:                General Anesthesia in OR. just prior to colonic                            resection. Complications:            No immediate complications. Estimated Blood Loss:     Estimated blood loss: none. Procedure:                Pre-Anesthesia Assessment:                           - Prior to the procedure, a History and Physical                            was performed, and patient medications and                            allergies were reviewed. The patient's tolerance of                            previous anesthesia was also reviewed. The risks                            and benefits of the procedure and the sedation                            options and risks were discussed with the patient.                            All questions were answered, and informed consent                            was obtained. Prior Anticoagulants: The patient has                            taken no previous anticoagulant or antiplatelet                            agents. ASA Grade Assessment: II - A  patient with                            mild systemic disease. After reviewing the risks                            and benefits, the patient was deemed in                            satisfactory condition to undergo the procedure.                           After obtaining informed consent, the colonoscope                            was passed under direct vision. Throughout the                     procedure, the patient's blood pressure, pulse, and                            oxygen saturations were monitored continuously. The                            PCF-H190DL ([id number] Olympus peds colonoscope was                            introduced through the anus and advanced to the                            descending colon. The PCF-H190DL ([id number]) Olympus                            peds colonscope was introduced through the with the                            intention of advancing to the cecum. The scope was                            advanced to the descending colon before the                            procedure was aborted. Medications were not given.                            The colonoscopy was technically difficult and                            complex due to inadequate bowel prep (only enemas                            given). The patient tolerated the procedure well.                            No bowel  preparation was given prior to the                            procedure. The quality of visualization was fair.                            Only preparation given: soap water enemas since                            patient had colonic obstruction. Scope In: Scope Out: Findings:      A fungating completely obstructing large mass was found in the mid       descending colon. The mass was circumferential. No bleeding was present.       Area was tattooed with an injection of 4 mL of [place] ink. The scope       couldnot be passed beyond.      Multiple medium-mouthed diverticula were found in the sigmoid colon.      Non-bleeding internal hemorrhoids were found during retroflexion and       during perianal exam. The hemorrhoids were small. 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 Tattooed.                           - Diverticulosis in the sigmoid colon.                           -  Non-bleeding internal hemorrhoids.                           - No specimens collected. Moderate Sedation:      none Recommendation:           - Repeat colonoscopy in 6 months once he recovers                            from colonic resection.                           - Return to GI clinic in 3 months. Procedure Code(s):        --- Professional ---                           [phone], 60, Colonoscopy, flexible; with directed                            submucosal injection(s), any substance Diagnosis Code(s):        --- Professional ---                           C18.6, Malignant neoplasm of descending colon                           K56.691, Other complete intestinal obstruction  K64.8, Other hemorrhoids                           K57.30, Diverticulosis of large intestine without                            perforation or abscess without bleeding                           R93.3, Abnormal findings on diagnostic imaging of                            other parts of digestive tract CPT copyright 2017 [employer]. All rights reserved. The codes documented in this report are preliminary and upon coder review may  be revised to meet current compliance requirements. Jackquline Denmark, MD 01/11/2018 6:48:27 PM This report has been signed electronically. Number of Addenda: 0

## 2018-01-11 NOTE — Progress Notes (Signed)
Patient with pulse from 105-125 in PACU with diltiazem at 5mg /hr, BP 100's/60's. Maintain current infusion rate and will go to stepdown and be evaluated by cardiology in the unit

## 2018-01-11 NOTE — H&P (View-Only) (Signed)
[name] [birth year]  [id number].    Requesting MD: Dr. Irene Pap Chief Complaint/Reason for Consult: colonic obstruction secondary to descending colon mass  HPI:  This is an otherwise healthy 72 yo white male who for the last 3 weeks has been having nausea, vomiting, and diarrhea.  He initially went to an urgent care where he had labs drawn that were normal secondary to left-sided abdominal pain.  When they called him back 2 days after his visit to discuss his lab results he informed them that he was now having nausea and vomiting as well as diarrhea.  He was then diagnosed with a viral gastroenteritis.  This continued to persist into the next week at which time he was seen again.  He was told this was gas and given Gas-X.  Once again this did not really help his symptoms.  He has not been able to eat much over the last 3 weeks but has been able to keep liquids down.  Earlier this week he began getting abdominal distention and bloating.  His diarrhea stopped about 3 days ago.  He last had a very soft bowel movement yesterday.  Because of his distention his wife brought him to the emergency department.  He then underwent a CT scan that revealed a descending colon mass with almost complete obstruction.  He has significant small bowel distention on his CT scan as well.  He was also found to be in new onset atrial fibrillation in the emergency department as well.  Gastroenterology as well as general surgery has been consulted for further evaluation and management.  The patient denies any weight loss except over the last 3 weeks secondary to his nausea and vomiting.  He last had a colonoscopy by Dr. [name] Plan 13 years ago.  He has not had a repeat colonoscopy as he did not like the prep.  He has had 2 episodes of diverticulitis in the remote past but none recently.  He denies any chest pain, shortness of breath, urinary symptoms, or any other complaints.  ROS: ROS: Please see HPI otherwise all  other systems have been reviewed and are negative.  History reviewed. No pertinent family history.  Past Medical History:  Diagnosis Date  . Diverticulitis     Past Surgical History:  Procedure Laterality Date  . ANKLE SURGERY     when he was in college  . thumb surgery       Social History:  reports that he has never smoked. He has never used smokeless tobacco. He reports that he drinks alcohol. His drug history is not on file.  Allergies: No Known Allergies  Medications Prior to Admission  Medication Sig Dispense Refill  . aspirin EC 81 MG tablet Take 81 mg by mouth daily.    [name] loperamide (IMODIUM) 2 MG capsule Take 1 capsule (2 mg total) by mouth 4 (four) times daily as needed for diarrhea or loose stools. 12 capsule 0  . Multiple Vitamin (MULTIVITAMIN WITH MINERALS) TABS tablet Take 1 tablet by mouth daily.    . ondansetron (ZOFRAN ODT) 8 MG disintegrating tablet Take 1 tablet (8 mg total) by mouth every 8 (eight) hours as needed for nausea or vomiting. 12 tablet 0  . simethicone (MYLICON) 630 MG chewable tablet Chew 125 mg by mouth every 6 (six) hours as needed for flatulence.       Physical Exam: Blood pressure 100/74, pulse 100, temperature 98.4 F (36.9 C), temperature source Oral, resp. rate 18,  height 5' 10" (1.778 m), weight 88.5 kg (195 lb), SpO2 96 %. General: pleasant, WD, WN white male who is laying in bed in NAD HEENT: head is normocephalic, atraumatic.  Sclera are noninjected.  PERRL.  Ears and nose without any masses or lesions.  Mouth is pink and moist Heart: Irregularly irregular.  Normal s1,s2. No obvious murmurs, gallops, or rubs noted.  Palpable radial and pedal pulses bilaterally Lungs: CTAB, no wheezes, rhonchi, or rales noted.  Respiratory effort nonlabored Abd: soft, NT currently, distended with tympany, hypoactive BS, no masses, hernias, or organomegaly MS: all 4 extremities are symmetrical with no cyanosis, clubbing, or edema. Skin: warm and dry  with no masses, lesions, or rashes Psych: A&Ox3 with an appropriate affect.   Results for orders placed or performed during the hospital encounter of 01/10/18 (from the past 48 hour(s))  CBC with Differential/Platelet     Status: Abnormal   Collection Time: 01/10/18  6:44 PM  Result Value Ref Range   WBC 5.5 4.0 - 10.5 K/uL   RBC 5.13 4.22 - 5.81 MIL/uL   Hemoglobin 15.5 13.0 - 17.0 g/dL   HCT 45.0 39.0 - 52.0 %   MCV 87.7 78.0 - 100.0 fL   MCH 30.2 26.0 - 34.0 pg   MCHC 34.4 30.0 - 36.0 g/dL   RDW 12.2 11.5 - 15.5 %   Platelets 305 150 - 400 K/uL   Neutrophils Relative % 49 %   Neutro Abs 2.7 1.7 - 7.7 K/uL   Lymphocytes Relative 31 %   Lymphs Abs 1.7 0.7 - 4.0 K/uL   Monocytes Relative 19 %   Monocytes Absolute 1.1 (H) 0.1 - 1.0 K/uL   Eosinophils Relative 0 %   Eosinophils Absolute 0.0 0.0 - 0.7 K/uL   Basophils Relative 1 %   Basophils Absolute 0.0 0.0 - 0.1 K/uL    Comment: Performed at [facility], [address] [place], [place] [address]  Comprehensive metabolic panel     Status: Abnormal   Collection Time: 01/10/18  6:44 PM  Result Value Ref Range   Sodium 135 135 - 145 mmol/L   Potassium 3.4 (L) 3.5 - 5.1 mmol/L   Chloride 97 (L) 98 - 111 mmol/L   CO2 25 22 - 32 mmol/L   Glucose, Bld 134 (H) 70 - 99 mg/dL   BUN 34 (H) 8 - 23 mg/dL   Creatinine, Ser 1.33 (H) 0.61 - 1.24 mg/dL   Calcium 8.6 (L) 8.9 - 10.3 mg/dL   Total Protein 7.7 6.5 - 8.1 g/dL   Albumin 4.0 3.5 - 5.0 g/dL   AST 42 (H) 15 - 41 U/L   ALT 85 (H) 0 - 44 U/L   Alkaline Phosphatase 141 (H) 38 - 126 U/L   Total Bilirubin 1.2 0.3 - 1.2 mg/dL   GFR calc non Af Amer 52 (L) >60 mL/min   GFR calc Af Amer >60 >60 mL/min    Comment: (NOTE) The eGFR has been calculated using the CKD EPI equation. This calculation has not been validated in all clinical situations. eGFR's persistently <60 mL/min signify possible Chronic Kidney Disease.    Anion gap 13 5 - 15    Comment: Performed at [facility], [address] [place], [place] [address]  Lipase, blood     Status: None   Collection Time: 01/10/18  6:44 PM  Result Value Ref Range   Lipase 36 11 - 51 U/L  Comment: Performed at [facility] [place], [address] [place], [place] [address]  Urinalysis, Routine w reflex microscopic     Status: Abnormal   Collection Time: 01/10/18  6:44 PM  Result Value Ref Range   Color, Urine AMBER (A) YELLOW    Comment: BIOCHEMICALS MAY BE AFFECTED BY COLOR   APPearance CLOUDY (A) CLEAR   Specific Gravity, Urine >1.030 (H) 1.005 - 1.030   pH 6.0 5.0 - 8.0   Glucose, UA NEGATIVE NEGATIVE mg/dL   Hgb urine dipstick LARGE (A) NEGATIVE   Bilirubin Urine MODERATE (A) NEGATIVE   Ketones, ur 15 (A) NEGATIVE mg/dL   Protein, ur >300 (A) NEGATIVE mg/dL   Nitrite NEGATIVE NEGATIVE   Leukocytes, UA NEGATIVE NEGATIVE    Comment: Performed at [facility] [place], [address] [place], [place] [address]  Urinalysis, Microscopic (reflex)     Status: Abnormal   Collection Time: 01/10/18  6:44 PM  Result Value Ref Range   RBC / HPF >50 0 - 5 RBC/hpf   WBC, UA 6-10 0 - 5 WBC/hpf   Bacteria, UA MANY (A) NONE SEEN   Squamous Epithelial / LPF 6-10 0 - 5   Mucus PRESENT    Hyaline Casts, UA PRESENT    Granular Casts, UA PRESENT     Comment: Performed at [facility] [place], [address] [place], [place] [address]  Magnesium     Status: None   Collection Time: 01/10/18  6:44 PM  Result Value Ref Range   Magnesium 2.2 1.7 - 2.4 mg/dL    Comment: Performed at [facility] [place], [address] [place], [place] [address]  Basic metabolic panel     Status: Abnormal   Collection Time: 01/11/18  4:35 AM  Result Value Ref Range   Sodium 138 135 - 145 mmol/L   Potassium 3.8 3.5 - 5.1 mmol/L   Chloride 105 98 - 111 mmol/L   CO2 25 22 - 32 mmol/L   Glucose, Bld 98 70 - 99 mg/dL   BUN 27 (H) 8 - 23 mg/dL   Creatinine, Ser 1.11 0.61 - 1.24 mg/dL   Calcium 8.5 (L)  8.9 - 10.3 mg/dL   GFR calc non Af Amer >60 >60 mL/min   GFR calc Af Amer >60 >60 mL/min    Comment: (NOTE) The eGFR has been calculated using the CKD EPI equation. This calculation has not been validated in all clinical situations. eGFR's persistently <60 mL/min signify possible Chronic Kidney Disease.    Anion gap 8 5 - 15    Comment: Performed at [facility], [address] [place], [place] [address]   Ct Abdomen Pelvis W Contrast  Result Date: 01/10/2018 CLINICAL DATA:  Abdominal distention with nausea, vomiting and diarrhea 4 days. History of diverticulitis. EXAM: CT ABDOMEN AND PELVIS WITH CONTRAST TECHNIQUE: Multidetector CT imaging of the abdomen and pelvis was performed using the standard protocol following bolus administration of intravenous contrast. CONTRAST:  100mL ISOVUE-300 IOPAMIDOL (ISOVUE-300) INJECTION 61% COMPARISON:  02/26/2014 FINDINGS: Lower chest: No acute abnormality. Hepatobiliary: Subcentimeter hypodensity over the right lobe of the liver too small to characterize but likely a cyst. Biliary tree is within normal. Possible minimal cholelithiasis. Pancreas: Normal. Spleen: Normal. Adrenals/Urinary Tract: Adrenal glands are normal. Kidneys normal size without hydronephrosis or nephrolithiasis. There is a stable 1.6 cm hypodensity over the lower pole right kidney unchanged and likely a slightly hyperdense cyst. Ureters are normal. Mild   increased density over the posterior dependent portion of the bladder which is in continuity with the adjacent mildly prominent prostate gland likely representing prostatic impression upon the bladder base although it would be difficult to exclude a intrinsic bladder mass. Stomach/Bowel: Stomach is normal. There multiple fluid and air-filled dilated small bowel loops to the level of the ileocecal valve measuring up to 4.8 cm in diameter. There is mild fluid and air-filled prominence of the colon to the level of the mid  descending colon with there is an abrupt caliber change and associated irregular soft tissue density suggesting neoplastic stricture. This is likely the site of obstruction. Moderate diverticulosis of the distal descending and sigmoid colon. Appendix demonstrates mild mucosal enhancement and measures 1.2 cm in thickness at the tip. There is no adjacent inflammatory change or free fluid. Vascular/Lymphatic: Minimal atherosclerotic plaque over the abdominal aorta and iliac arteries. No adenopathy. Reproductive: Mild prostatic enlargement. Other: No significant free fluid or free peritoneal air. Musculoskeletal: Degenerative change of the spine and hips. Multilevel disc disease throughout the lumbar spine. IMPRESSION: Irregular soft tissue density causing stricture of the mid descending colon likely the site of obstruction for the dilated small bowel. 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 No evidence of perforation. Equivocal findings involving the appendix measuring 1.2 cm at the appendiceal tip with mucosal enhancement. No adjacent free fluid or inflammatory change. Findings are nonspecific, but can be seen with early acute appendicitis. Mild prostatic enlargement. Increased density over the posterior bladder base likely due to the large prostatic impression although cannot completely exclude a bladder mass. Urology protocol CT or ultrasound may be helpful for better evaluation. Mild cholelithiasis. Stable 1.5 cm cystic structure over the lower pole right kidney likely slightly hyperdense cyst. Diverticulosis of the colon. Aortic Atherosclerosis (ICD10-I70.0). These results were called by telephone at the time of interpretation on 01/10/2018 at 8:25 pm to Dr. Shirlyn Goltz , who verbally acknowledged these results. Electronically Signed   By: Marin Olp M.D.   On: 01/10/2018 20:25      Assessment/Plan Colonic obstruction secondary to descending colon mass Based on the patient's CT scan, it appears he has a  descending colon mass.  This is likely either a malignancy versus a benign stricture secondary to a prior history of diverticulitis, although he has not had any diverticular episodes in the recent past.  A CEA has been ordered.  Gastroenterology has been consulted as well and will likely proceed with a colonoscopy to further evaluate this mass.  Once this is completed he will likely need to undergo surgical resection with colostomy given his obstructive pattern.  Given his small bowel dilatation as well as colonic dilatation, we will insert an NG tube for decompression.  This all has been thoroughly discussed with the patient as well as his wife.  They both understand the proposed plan as of right now.  New onset atrial fibrillation Patient is currently on Cardizem for rate control.  We will defer this to the medicine service as well as cardiology.   FEN -n.p.o./IV fluids VTE -SCDs ID -none currently  Henreitta Cea, [facility] Surgery 01/11/2018, 8:13 AM Pager: [phone]

## 2018-01-11 NOTE — Consult Note (Signed)
Dave Vaughn Jul 02, 1945  [id number].    Requesting MD: Dr. Irene Pap Chief Complaint/Reason for Consult: colonic obstruction secondary to descending colon mass  HPI:  This is an otherwise healthy 72 yo white male who for the last 3 weeks has been having nausea, vomiting, and diarrhea.  He initially went to an urgent care where he had labs drawn that were normal secondary to left-sided abdominal pain.  When they called him back 2 days after his visit to discuss his lab results he informed them that he was now having nausea and vomiting as well as diarrhea.  He was then diagnosed with a viral gastroenteritis.  This continued to persist into the next week at which time he was seen again.  He was told this was gas and given Gas-X.  Once again this did not really help his symptoms.  He has not been able to eat much over the last 3 weeks but has been able to keep liquids down.  Earlier this week he began getting abdominal distention and bloating.  His diarrhea stopped about 3 days ago.  He last had a very soft bowel movement yesterday.  Because of his distention his wife brought him to the emergency department.  He then underwent a CT scan that revealed a descending colon mass with almost complete obstruction.  He has significant small bowel distention on his CT scan as well.  He was also found to be in new onset atrial fibrillation in the emergency department as well.  Gastroenterology as well as general surgery has been consulted for further evaluation and management.  The patient denies any weight loss except over the last 3 weeks secondary to his nausea and vomiting.  He last had a colonoscopy by Dr. [name] Plan 13 years ago.  He has not had a repeat colonoscopy as he did not like the prep.  He has had 2 episodes of diverticulitis in the remote past but none recently.  He denies any chest pain, shortness of breath, urinary symptoms, or any other complaints.  ROS: ROS: Please see HPI otherwise all  other systems have been reviewed and are negative.  History reviewed. No pertinent family history.  Past Medical History:  Diagnosis Date  . Diverticulitis     Past Surgical History:  Procedure Laterality Date  . ANKLE SURGERY     when he was in college  . thumb surgery       Social History:  reports that he has never smoked. He has never used smokeless tobacco. He reports that he drinks alcohol. His drug history is not on file.  Allergies: No Known Allergies  Medications Prior to Admission  Medication Sig Dispense Refill  . aspirin EC 81 MG tablet Take 81 mg by mouth daily.    Dave Vaughn loperamide (IMODIUM) 2 MG capsule Take 1 capsule (2 mg total) by mouth 4 (four) times daily as needed for diarrhea or loose stools. 12 capsule 0  . Multiple Vitamin (MULTIVITAMIN WITH MINERALS) TABS tablet Take 1 tablet by mouth daily.    . ondansetron (ZOFRAN ODT) 8 MG disintegrating tablet Take 1 tablet (8 mg total) by mouth every 8 (eight) hours as needed for nausea or vomiting. 12 tablet 0  . simethicone (MYLICON) 630 MG chewable tablet Chew 125 mg by mouth every 6 (six) hours as needed for flatulence.       Physical Exam: Blood pressure 100/74, pulse 100, temperature 98.4 F (36.9 C), temperature source Oral, resp. rate 18,  height 5' 10" (1.778 m), weight 88.5 kg (195 lb), SpO2 96 %. General: pleasant, WD, WN white male who is laying in bed in NAD HEENT: head is normocephalic, atraumatic.  Sclera are noninjected.  PERRL.  Ears and nose without any masses or lesions.  Mouth is pink and moist Heart: Irregularly irregular.  Normal s1,s2. No obvious murmurs, gallops, or rubs noted.  Palpable radial and pedal pulses bilaterally Lungs: CTAB, no wheezes, rhonchi, or rales noted.  Respiratory effort nonlabored Abd: soft, NT currently, distended with tympany, hypoactive BS, no masses, hernias, or organomegaly MS: all 4 extremities are symmetrical with no cyanosis, clubbing, or edema. Skin: warm and dry  with no masses, lesions, or rashes Psych: A&Ox3 with an appropriate affect.   Results for orders placed or performed during the hospital encounter of 01/10/18 (from the past 48 hour(s))  CBC with Differential/Platelet     Status: Abnormal   Collection Time: 01/10/18  6:44 PM  Result Value Ref Range   WBC 5.5 4.0 - 10.5 K/uL   RBC 5.13 4.22 - 5.81 MIL/uL   Hemoglobin 15.5 13.0 - 17.0 g/dL   HCT 45.0 39.0 - 52.0 %   MCV 87.7 78.0 - 100.0 fL   MCH 30.2 26.0 - 34.0 pg   MCHC 34.4 30.0 - 36.0 g/dL   RDW 12.2 11.5 - 15.5 %   Platelets 305 150 - 400 K/uL   Neutrophils Relative % 49 %   Neutro Abs 2.7 1.7 - 7.7 K/uL   Lymphocytes Relative 31 %   Lymphs Abs 1.7 0.7 - 4.0 K/uL   Monocytes Relative 19 %   Monocytes Absolute 1.1 (H) 0.1 - 1.0 K/uL   Eosinophils Relative 0 %   Eosinophils Absolute 0.0 0.0 - 0.7 K/uL   Basophils Relative 1 %   Basophils Absolute 0.0 0.0 - 0.1 K/uL    Comment: Performed at [facility] [place], [address] [place], [place] [address]  Comprehensive metabolic panel     Status: Abnormal   Collection Time: 01/10/18  6:44 PM  Result Value Ref Range   Sodium 135 135 - 145 mmol/L   Potassium 3.4 (L) 3.5 - 5.1 mmol/L   Chloride 97 (L) 98 - 111 mmol/L   CO2 25 22 - 32 mmol/L   Glucose, Bld 134 (H) 70 - 99 mg/dL   BUN 34 (H) 8 - 23 mg/dL   Creatinine, Ser 1.33 (H) 0.61 - 1.24 mg/dL   Calcium 8.6 (L) 8.9 - 10.3 mg/dL   Total Protein 7.7 6.5 - 8.1 g/dL   Albumin 4.0 3.5 - 5.0 g/dL   AST 42 (H) 15 - 41 U/L   ALT 85 (H) 0 - 44 U/L   Alkaline Phosphatase 141 (H) 38 - 126 U/L   Total Bilirubin 1.2 0.3 - 1.2 mg/dL   GFR calc non Af Amer 52 (L) >60 mL/min   GFR calc Af Amer >60 >60 mL/min    Comment: (NOTE) The eGFR has been calculated using the CKD EPI equation. This calculation has not been validated in all clinical situations. eGFR's persistently <60 mL/min signify possible Chronic Kidney Disease.    Anion gap 13 5 - 15    Comment: Performed at [facility] [place], [address] [place], [place] [address]  Lipase, blood     Status: None   Collection Time: 01/10/18  6:44 PM  Result Value Ref Range   Lipase 36 11 - 51 U/L      Comment: Performed at [facility] [place], [address] [place], [place] [address]  Urinalysis, Routine w reflex microscopic     Status: Abnormal   Collection Time: 01/10/18  6:44 PM  Result Value Ref Range   Color, Urine AMBER (A) YELLOW    Comment: BIOCHEMICALS MAY BE AFFECTED BY COLOR   APPearance CLOUDY (A) CLEAR   Specific Gravity, Urine >1.030 (H) 1.005 - 1.030   pH 6.0 5.0 - 8.0   Glucose, UA NEGATIVE NEGATIVE mg/dL   Hgb urine dipstick LARGE (A) NEGATIVE   Bilirubin Urine MODERATE (A) NEGATIVE   Ketones, ur 15 (A) NEGATIVE mg/dL   Protein, ur >300 (A) NEGATIVE mg/dL   Nitrite NEGATIVE NEGATIVE   Leukocytes, UA NEGATIVE NEGATIVE    Comment: Performed at [facility] [place], [address] [place], [place] [address]  Urinalysis, Microscopic (reflex)     Status: Abnormal   Collection Time: 01/10/18  6:44 PM  Result Value Ref Range   RBC / HPF >50 0 - 5 RBC/hpf   WBC, UA 6-10 0 - 5 WBC/hpf   Bacteria, UA MANY (A) NONE SEEN   Squamous Epithelial / LPF 6-10 0 - 5   Mucus PRESENT    Hyaline Casts, UA PRESENT    Granular Casts, UA PRESENT     Comment: Performed at [facility] [place], [address] [place], [place] [address]  Magnesium     Status: None   Collection Time: 01/10/18  6:44 PM  Result Value Ref Range   Magnesium 2.2 1.7 - 2.4 mg/dL    Comment: Performed at [facility] [place], [address] [place], [place] [address]  Basic metabolic panel     Status: Abnormal   Collection Time: 01/11/18  4:35 AM  Result Value Ref Range   Sodium 138 135 - 145 mmol/L   Potassium 3.8 3.5 - 5.1 mmol/L   Chloride 105 98 - 111 mmol/L   CO2 25 22 - 32 mmol/L   Glucose, Bld 98 70 - 99 mg/dL   BUN 27 (H) 8 - 23 mg/dL   Creatinine, Ser 1.11 0.61 - 1.24 mg/dL   Calcium 8.5 (L)  8.9 - 10.3 mg/dL   GFR calc non Af Amer >60 >60 mL/min   GFR calc Af Amer >60 >60 mL/min    Comment: (NOTE) The eGFR has been calculated using the CKD EPI equation. This calculation has not been validated in all clinical situations. eGFR's persistently <60 mL/min signify possible Chronic Kidney Disease.    Anion gap 8 5 - 15    Comment: Performed at [facility], [address] [place], [place] [address]   Ct Abdomen Pelvis W Contrast  Result Date: 01/10/2018 CLINICAL DATA:  Abdominal distention with nausea, vomiting and diarrhea 4 days. History of diverticulitis. EXAM: CT ABDOMEN AND PELVIS WITH CONTRAST TECHNIQUE: Multidetector CT imaging of the abdomen and pelvis was performed using the standard protocol following bolus administration of intravenous contrast. CONTRAST:  100mL ISOVUE-300 IOPAMIDOL (ISOVUE-300) INJECTION 61% COMPARISON:  02/26/2014 FINDINGS: Lower chest: No acute abnormality. Hepatobiliary: Subcentimeter hypodensity over the right lobe of the liver too small to characterize but likely a cyst. Biliary tree is within normal. Possible minimal cholelithiasis. Pancreas: Normal. Spleen: Normal. Adrenals/Urinary Tract: Adrenal glands are normal. Kidneys normal size without hydronephrosis or nephrolithiasis. There is a stable 1.6 cm hypodensity over the lower pole right kidney unchanged and likely a slightly hyperdense cyst. Ureters are normal. Mild   increased density over the posterior dependent portion of the bladder which is in continuity with the adjacent mildly prominent prostate gland likely representing prostatic impression upon the bladder base although it would be difficult to exclude a intrinsic bladder mass. Stomach/Bowel: Stomach is normal. There multiple fluid and air-filled dilated small bowel loops to the level of the ileocecal valve measuring up to 4.8 cm in diameter. There is mild fluid and air-filled prominence of the colon to the level of the mid  descending colon with there is an abrupt caliber change and associated irregular soft tissue density suggesting neoplastic stricture. This is likely the site of obstruction. Moderate diverticulosis of the distal descending and sigmoid colon. Appendix demonstrates mild mucosal enhancement and measures 1.2 cm in thickness at the tip. There is no adjacent inflammatory change or free fluid. Vascular/Lymphatic: Minimal atherosclerotic plaque over the abdominal aorta and iliac arteries. No adenopathy. Reproductive: Mild prostatic enlargement. Other: No significant free fluid or free peritoneal air. Musculoskeletal: Degenerative change of the spine and hips. Multilevel disc disease throughout the lumbar spine. IMPRESSION: Irregular soft tissue density causing stricture of the mid descending colon likely the site of obstruction for the dilated small bowel. 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 No evidence of perforation. Equivocal findings involving the appendix measuring 1.2 cm at the appendiceal tip with mucosal enhancement. No adjacent free fluid or inflammatory change. Findings are nonspecific, but can be seen with early acute appendicitis. Mild prostatic enlargement. Increased density over the posterior bladder base likely due to the large prostatic impression although cannot completely exclude a bladder mass. Urology protocol CT or ultrasound may be helpful for better evaluation. Mild cholelithiasis. Stable 1.5 cm cystic structure over the lower pole right kidney likely slightly hyperdense cyst. Diverticulosis of the colon. Aortic Atherosclerosis (ICD10-I70.0). These results were called by telephone at the time of interpretation on 01/10/2018 at 8:25 pm to Dr. Shirlyn Goltz , who verbally acknowledged these results. Electronically Signed   By: Marin Olp M.D.   On: 01/10/2018 20:25      Assessment/Plan Colonic obstruction secondary to descending colon mass Based on the patient's CT scan, it appears he has a  descending colon mass.  This is likely either a malignancy versus a benign stricture secondary to a prior history of diverticulitis, although he has not had any diverticular episodes in the recent past.  A CEA has been ordered.  Gastroenterology has been consulted as well and will likely proceed with a colonoscopy to further evaluate this mass.  Once this is completed he will likely need to undergo surgical resection with colostomy given his obstructive pattern.  Given his small bowel dilatation as well as colonic dilatation, we will insert an NG tube for decompression.  This all has been thoroughly discussed with the patient as well as his wife.  They both understand the proposed plan as of right now.  New onset atrial fibrillation Patient is currently on Cardizem for rate control.  We will defer this to the medicine service as well as cardiology.   FEN -n.p.o./IV fluids VTE -SCDs ID -none currently  Henreitta Cea, [facility] 01/11/2018, 8:13 AM Pager: [phone]

## 2018-01-11 NOTE — H&P (Addendum)
History and Physical    Dave Vaughn YHC:[id number] DOB: 01/29/1946 DOA: 01/10/2018  PCP: [name], FNP  Patient coming from: Home  I have personally briefly reviewed patient's old medical records in [place]  Chief Complaint: Abd pain, vomiting  HPI: Dave Vaughn is a 72 y.o. male with medical history significant of diverticulitis.  Patient presents to ED at [facility] with c/o abd pain, vomiting.  Was seen last week for same symptoms, thought to be a GI bug at that time; however, symptoms have persisted so returns to ED.   ED Course: CT shows colonic obstruction though due to mass or stricture, some nonspecific finding around appendix.  Dr. Excell Seltzer called by EDP: likely not true appendicitis.  Dr. Silverio Decamp called by EDP: scope tomorrow.  While in ED went into new onset A.Fib with rate 110.  Given 10mg  cardizem IV and rate down to 75.   Review of Systems: As per HPI otherwise 10 point review of systems negative.   Past Medical History:  Diagnosis Date  . Diverticulitis     Past Surgical History:  Procedure Laterality Date  . thumb surgery        reports that he has never smoked. He has never used smokeless tobacco. He reports that he drinks alcohol. His drug history is not on file.  No Known Allergies  History reviewed. No pertinent family history.   Prior to Admission medications   Medication Sig Start Date End Date Taking? Authorizing Provider  loperamide (IMODIUM) 2 MG capsule Take 1 capsule (2 mg total) by mouth 4 (four) times daily as needed for diarrhea or loose stools. 12/29/17  Yes Dorie Rank, MD  Multiple Vitamin (MULTIVITAMIN WITH MINERALS) TABS tablet Take 1 tablet by mouth daily.   Yes [provider]  ondansetron (ZOFRAN ODT) 8 MG disintegrating tablet Take 1 tablet (8 mg total) by mouth every 8 (eight) hours as needed for nausea or vomiting. 12/29/17  Yes Dorie Rank, MD    Physical Exam: Vitals:   01/10/18 2315 01/10/18 2330  01/10/18 2345 01/11/18 0105  BP: 116/88 108/83 127/87 (!) 125/92  Pulse: 92 81 96 75  Resp: (!) 22 (!) 22 (!) 21 20  Temp:    98.6 F (37 C)  TempSrc:    Oral  SpO2: 96% 96% 98% 96%  Weight:      Height:        Constitutional: NAD, calm, comfortable Eyes: PERRL, lids and conjunctivae normal ENMT: Mucous membranes are moist. Posterior pharynx clear of any exudate or lesions.Normal dentition.  Neck: normal, supple, no masses, no thyromegaly Respiratory: clear to auscultation bilaterally, no wheezing, no crackles. Normal respiratory effort. No accessory muscle use.  Cardiovascular: IRR, IRR Abdomen: Distended, mild diffuse tenderness, no rebound Musculoskeletal: no clubbing / cyanosis. No joint deformity upper and lower extremities. Good ROM, no contractures. Normal muscle tone.  Skin: no rashes, lesions, ulcers. No induration Neurologic: CN 2-12 grossly intact. Sensation intact, DTR normal. Strength 5/5 in all 4.  Psychiatric: Normal judgment and insight. Alert and oriented x 3. Normal mood.    Labs on Admission: I have personally reviewed following labs and imaging studies  CBC: Recent Labs  Lab 01/10/18 1844  WBC 5.5  NEUTROABS 2.7  HGB 15.5  HCT 45.0  MCV 87.7  PLT 517   Basic Metabolic Panel: Recent Labs  Lab 01/10/18 1844  NA 135  K 3.4*  CL 97*  CO2 25  GLUCOSE 134*  BUN 34*  CREATININE 1.33*  CALCIUM 8.6*  MG 2.2   GFR: Estimated Creatinine Clearance: 57.1 mL/min (A) (by C-G formula based on SCr of 1.33 mg/dL (H)). Liver Function Tests: Recent Labs  Lab 01/10/18 1844  AST 42*  ALT 85*  ALKPHOS 141*  BILITOT 1.2  PROT 7.7  ALBUMIN 4.0   Recent Labs  Lab 01/10/18 1844  LIPASE 36   No results for input(s): AMMONIA in the last 168 hours. Coagulation Profile: No results for input(s): INR, PROTIME in the last 168 hours. Cardiac Enzymes: No results for input(s): CKTOTAL, CKMB, CKMBINDEX, TROPONINI in the last 168 hours. BNP (last 3 results) No  results for input(s): PROBNP in the last 8760 hours. HbA1C: No results for input(s): HGBA1C in the last 72 hours. CBG: No results for input(s): GLUCAP in the last 168 hours. Lipid Profile: No results for input(s): CHOL, HDL, LDLCALC, TRIG, CHOLHDL, LDLDIRECT in the last 72 hours. Thyroid Function Tests: No results for input(s): TSH, T4TOTAL, FREET4, T3FREE, THYROIDAB in the last 72 hours. Anemia Panel: No results for input(s): VITAMINB12, FOLATE, FERRITIN, TIBC, IRON, RETICCTPCT in the last 72 hours. Urine analysis:    Component Value Date/Time   COLORURINE AMBER (A) 01/10/2018 1844   APPEARANCEUR CLOUDY (A) 01/10/2018 1844   LABSPEC >1.030 (H) 01/10/2018 1844   PHURINE 6.0 01/10/2018 1844   GLUCOSEU NEGATIVE 01/10/2018 1844   HGBUR LARGE (A) 01/10/2018 1844   BILIRUBINUR MODERATE (A) 01/10/2018 1844   KETONESUR 15 (A) 01/10/2018 1844   PROTEINUR >300 (A) 01/10/2018 1844   NITRITE NEGATIVE 01/10/2018 1844   LEUKOCYTESUR NEGATIVE 01/10/2018 1844    Radiological Exams on Admission: Ct Abdomen Pelvis W Contrast  Result Date: 01/10/2018 CLINICAL DATA:  Abdominal distention with nausea, vomiting and diarrhea 4 days. History of diverticulitis. EXAM: CT ABDOMEN AND PELVIS WITH CONTRAST TECHNIQUE: Multidetector CT imaging of the abdomen and pelvis was performed using the standard protocol following bolus administration of intravenous contrast. CONTRAST:  170mL ISOVUE-300 IOPAMIDOL (ISOVUE-300) INJECTION 61% COMPARISON:  02/26/2014 FINDINGS: Lower chest: No acute abnormality. Hepatobiliary: Subcentimeter hypodensity over the right lobe of the liver too small to characterize but likely a cyst. Biliary tree is within normal. Possible minimal cholelithiasis. Pancreas: Normal. Spleen: Normal. Adrenals/Urinary Tract: Adrenal glands are normal. Kidneys normal size without hydronephrosis or nephrolithiasis. There is a stable 1.6 cm hypodensity over the lower pole right kidney unchanged and likely a  slightly hyperdense cyst. Ureters are normal. Mild increased density over the posterior dependent portion of the bladder which is in continuity with the adjacent mildly prominent prostate gland likely representing prostatic impression upon the bladder base although it would be difficult to exclude a intrinsic bladder mass. Stomach/Bowel: Stomach is normal. There multiple fluid and air-filled dilated small bowel loops to the level of the ileocecal valve measuring up to 4.8 cm in diameter. There is mild fluid and air-filled prominence of the colon to the level of the mid descending colon with there is an abrupt caliber change and associated irregular soft tissue density suggesting neoplastic stricture. This is likely the site of obstruction. Moderate diverticulosis of the distal descending and sigmoid colon. Appendix demonstrates mild mucosal enhancement and measures 1.2 cm in thickness at the tip. There is no adjacent inflammatory change or free fluid. Vascular/Lymphatic: Minimal atherosclerotic plaque over the abdominal aorta and iliac arteries. No adenopathy. Reproductive: Mild prostatic enlargement. Other: No significant free fluid or free peritoneal air. Musculoskeletal: Degenerative change of the spine and hips. Multilevel disc disease throughout the lumbar spine. IMPRESSION: Irregular soft tissue  density causing stricture of the mid descending colon likely the site of obstruction for the dilated small bowel. 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 No evidence of perforation. Equivocal findings involving the appendix measuring 1.2 cm at the appendiceal tip with mucosal enhancement. No adjacent free fluid or inflammatory change. Findings are nonspecific, but can be seen with early acute appendicitis. Mild prostatic enlargement. Increased density over the posterior bladder base likely due to the large prostatic impression although cannot completely exclude a bladder mass. Urology protocol CT or ultrasound may be  helpful for better evaluation. Mild cholelithiasis. Stable 1.5 cm cystic structure over the lower pole right kidney likely slightly hyperdense cyst. Diverticulosis of the colon. Aortic Atherosclerosis (ICD10-I70.0). These results were called by telephone at the time of interpretation on 01/10/2018 at 8:25 pm to Dr. Shirlyn Goltz , who verbally acknowledged these results. Electronically Signed   By: Marin Olp M.D.   On: 01/10/2018 20:25    EKG: Independently reviewed.  Assessment/Plan Principal Problem:   Colonic obstruction (HCC) Active Problems:   Bowel obstruction (HCC)   New onset a-fib ([place])    1. Colon obstruction - due to mass or stricture 1. NPO 2. IVF 3. Dr. Silverio Decamp likely to scope today 4. Hold off on NGT for the moment 5. Nausea and pain control 2. New onset A.Fib - 1. Rate controlled after 10mg  cardizem 2. Will put in for PRN metoprolol IV for rate control if needed 3. CHADS-vasc of 1  DVT prophylaxis: SCDs Code Status: Full Family Communication: Family at bedside Disposition Plan: Home after admit Consults called: GI and gen surg Admission status: Admit to inpatient   Etta Quill DO [facility] Pager [phone] Only works nights!  If 7AM-7PM, please contact the primary day team physician taking care of patient  [email or web address] Password [email or web address]  01/11/2018, 1:52 AM

## 2018-01-11 NOTE — Progress Notes (Signed)
Tap water enemas administered x 2 with small amount of brown form stool returned.

## 2018-01-11 NOTE — Anesthesia Postprocedure Evaluation (Signed)
Anesthesia Post Note  Patient: Dave Vaughn  Procedure(s) Performed: LEFT COLON RESECTION, TAKEDOWN SPLENIC FLEXURE, COLOSTOMY (N/A ) COLONOSCOPY     Patient location during evaluation: PACU Anesthesia Type: General Level of consciousness: awake and alert Pain management: pain level controlled Vital Signs Assessment: post-procedure vital signs reviewed and stable Respiratory status: spontaneous breathing, nonlabored ventilation, respiratory function stable and patient connected to nasal cannula oxygen Cardiovascular status: blood pressure returned to baseline and stable Postop Assessment: no apparent nausea or vomiting Anesthetic complications: no    Last Vitals:  Vitals:   01/11/18 1730 01/11/18 1745  BP: 116/81 91/69  Pulse: (!) 108 (!) 103  Resp: 14 19  Temp: 36.6 C   SpO2: 99% 99%    Last Pain:  Vitals:   01/11/18 1745  TempSrc:   PainSc: Asleep                 Gabrielle Wakeland S

## 2018-01-12 ENCOUNTER — Encounter (HOSPITAL_COMMUNITY): Payer: Self-pay | Admitting: General Surgery

## 2018-01-12 ENCOUNTER — Inpatient Hospital Stay (HOSPITAL_COMMUNITY): Payer: 59

## 2018-01-12 DIAGNOSIS — I480 Paroxysmal atrial fibrillation: Secondary | ICD-10-CM

## 2018-01-12 LAB — CBC
HCT: 44.9 % (ref 39.0–52.0)
HEMOGLOBIN: 14.8 g/dL (ref 13.0–17.0)
MCH: 29.5 pg (ref 26.0–34.0)
MCHC: 33 g/dL (ref 30.0–36.0)
MCV: 89.4 fL (ref 78.0–100.0)
Platelets: 277 10*3/uL (ref 150–400)
RBC: 5.02 MIL/uL (ref 4.22–5.81)
RDW: 12.5 % (ref 11.5–15.5)
WBC: 12.7 10*3/uL — ABNORMAL HIGH (ref 4.0–10.5)

## 2018-01-12 LAB — COMPREHENSIVE METABOLIC PANEL
ALBUMIN: 2.6 g/dL — AB (ref 3.5–5.0)
ALK PHOS: 86 U/L (ref 38–126)
ALT: 59 U/L — AB (ref 0–44)
AST: 34 U/L (ref 15–41)
Anion gap: 10 (ref 5–15)
BUN: 27 mg/dL — ABNORMAL HIGH (ref 8–23)
CALCIUM: 8.1 mg/dL — AB (ref 8.9–10.3)
CO2: 25 mmol/L (ref 22–32)
CREATININE: 1.21 mg/dL (ref 0.61–1.24)
Chloride: 107 mmol/L (ref 98–111)
GFR calc Af Amer: >60 mL/min (ref >60)
GFR calc non Af Amer: 58 mL/min — ABNORMAL LOW (ref >60)
GLUCOSE: 124 mg/dL — AB (ref 70–99)
Potassium: 4 mmol/L (ref 3.5–5.1)
Sodium: 142 mmol/L (ref 135–145)
Total Bilirubin: 1.6 mg/dL — ABNORMAL HIGH (ref 0.3–1.2)
Total Protein: 5.3 g/dL — ABNORMAL LOW (ref 6.5–8.1)

## 2018-01-12 LAB — ECHOCARDIOGRAM COMPLETE
HEIGHTINCHES: 70 in
WEIGHTICAEL: 3120 [oz_av]

## 2018-01-12 LAB — PREALBUMIN: Prealbumin: 13.6 mg/dL — ABNORMAL LOW (ref 18–38)

## 2018-01-12 LAB — CEA: CEA: 3.6 ng/mL (ref 0.0–4.7)

## 2018-01-12 MED ORDER — OFF THE BEAT BOOK
Freq: Once | Status: AC
  Administered 2018-01-12: 03:00:00
  Filled 2018-01-12: qty 1

## 2018-01-12 MED ORDER — ENSURE SURGERY PO LIQD
237.0000 mL | Freq: Two times a day (BID) | ORAL | Status: DC
  Administered 2018-01-12: 237 mL via ORAL
  Filled 2018-01-12 (×11): qty 237

## 2018-01-12 MED ORDER — LACTATED RINGERS IV SOLN
INTRAVENOUS | Status: DC
  Administered 2018-01-12 – 2018-01-15 (×6): via INTRAVENOUS

## 2018-01-12 MED ORDER — CEFOTETAN DISODIUM-DEXTROSE 1-3.58 GM-%(50ML) IV SOLR
1.0000 g | Freq: Two times a day (BID) | INTRAVENOUS | Status: AC
  Administered 2018-01-12 – 2018-01-16 (×10): 1 g via INTRAVENOUS
  Filled 2018-01-12 (×11): qty 50

## 2018-01-12 MED ORDER — MORPHINE SULFATE (PF) 2 MG/ML IV SOLN
1.0000 mg | INTRAVENOUS | Status: DC | PRN
  Administered 2018-01-12 – 2018-01-15 (×21): 2 mg via INTRAVENOUS
  Administered 2018-01-15: 4 mg via INTRAVENOUS
  Administered 2018-01-15 – 2018-01-17 (×6): 2 mg via INTRAVENOUS
  Filled 2018-01-12 (×21): qty 1
  Filled 2018-01-12: qty 2
  Filled 2018-01-12 (×4): qty 1
  Filled 2018-01-12: qty 2
  Filled 2018-01-12 (×3): qty 1

## 2018-01-12 MED ORDER — BOOST / RESOURCE BREEZE PO LIQD CUSTOM
1.0000 | Freq: Two times a day (BID) | ORAL | Status: DC
  Administered 2018-01-12: 1 via ORAL

## 2018-01-12 NOTE — Consult Note (Signed)
[facility] Nurse ostomy follow up Stoma type/location: RUQ end colostomy Stomal assessment/size: 1 5/8" x 2" oval Peristomal assessment: red, moist, above skin level.  Treatment options for stomal/peristomal skin: barrier ring Output liquid brown effluent and gas Ostomy pouching: 2pc. 2 3/4" Education provided: Demonstrated for pt and wife emptying pouch and cleaning with toilet paper wicks. Wife practiced opening and closing pouch. Clean pouch left for her to practice on. Honeycomb dressing reinforced with op site dressing prior to placement of ostomy pouch. Very appreciative of education. Wife did become dizzy upon removal of pouch. Sat in chair for a few minutes until she felt she could participate again.  Enrolled patient in [facility] program: Yes, earlier today. We will follow this patient and remain available to this patient, nursing, and the medical and surgical teams. Fara Olden, RN-C, WTA-C, [facility] Care Associate

## 2018-01-12 NOTE — Progress Notes (Signed)
Initial Nutrition Assessment  DOCUMENTATION CODES:   Severe malnutrition in context of acute illness/injury  INTERVENTION:  - Diet advancement as medically feasible.  - Will order Boost Breeze BID for once diet advanced to CLD, each supplement provides 250 kcal and 9 grams of protein. - Will order Ensure Surgery BID for once diet advanced to at least FLD, each supplement provides 330 kcal and 18 grams of protein.  NUTRITION DIAGNOSIS:   Severe Malnutrition related to acute illness(malignant obstructing colonic mass) as evidenced by mild fat depletion, mild muscle depletion, energy intake < or equal to 50% for > or equal to 5 days, percent weight loss.  GOAL:   Patient will meet greater than or equal to 90% of their needs  MONITOR:   Diet advancement, Weight trends, Labs, I & O's, Skin  REASON FOR ASSESSMENT:   Malnutrition Screening Tool  ASSESSMENT:   72 y.o. male with medical history significant of diverticulitis.  Patient presents to ED at [facility] with c/o abd pain, vomiting.  Was seen last week for same symptoms, which at that time was thought to be a GI bug. Symptoms have persisted so he returned to the ED on 8/6. CT showed colonic obstruction.  BMI indicates overweight status. Patient has been NPO since admission. He is POD #1 L colon resection, takedown of splenic flexture, and colostomy. NGT remains to LIS and patient reports he was informed that this is planned to be removed later today versus tomorrow morning. He denies nausea at this time and states that abdominal pain is minimal.  Patient reports that ~3 weeks ago he began having diarrhea and a few days to a week later began having vomiting with all PO intakes. He states that during that time he would either have diarrhea or no BM at all. He states that in the past 2 weeks he was only able to consume 1 full meal. He was trying to consume Gatorade d/t feelings of dehydration, weakness, and lightheadedness. Otherwise, he was  only able to consume a few bites of items each day. Patient reports that during that time he was also experiencing increasing abdominal distention/bloating with associated pressure and firm feeling when palpated.   NFPE outlined below. Patient reports UBW of 220 lbs and that he last weighed this before diarrhea began. Per chart review, patient weighed 208 lbs on 7/25. Current weight os 195 lbs indicates 11% weight loss from UBW and 6% body weight in the past 2 weeks.  Medications reviewed; 20 mg IV Pepcid/day. Labs reviewed; BUN: 27 mg/dL, Ca: 8.1 mg/dL, GFR: 58 mL/min.  IVF: LR @ 75 mL/hr.       NUTRITION - FOCUSED PHYSICAL EXAM:    Most Recent Value  Orbital Region  No depletion  Upper Arm Region  Mild depletion  Thoracic and Lumbar Region  No depletion  Buccal Region  No depletion  Temple Region  No depletion  Clavicle Bone Region  Moderate depletion  Clavicle and Acromion Bone Region  Mild depletion  Scapular Bone Region  Mild depletion  Dorsal Hand  No depletion  Patellar Region  No depletion  Anterior Thigh Region  Mild depletion  Posterior Calf Region  Mild depletion  Edema (RD Assessment)  None  Hair  Reviewed  Eyes  Reviewed  Mouth  Reviewed  Skin  Reviewed  Nails  Reviewed       Diet Order:   Diet Order            Diet NPO time specified  Except for: Ice Chips  Diet effective now              EDUCATION NEEDS:   Not appropriate for education at this time  Skin:  Skin Assessment: Skin Integrity Issues: Skin Integrity Issues:: Incisions Incisions: abdominal (8/7)  Last BM:  PTA/unknown  Height:   Ht Readings from Last 1 Encounters:  01/10/18 5\' 10"  (1.778 m)    Weight:   Wt Readings from Last 1 Encounters:  01/10/18 88.5 kg    Ideal Body Weight:  75.45 kg  BMI:  Body mass index is 27.98 kg/m.  Estimated Nutritional Needs:   Kcal:  2215-2480 (25-28 kcal/kg)  Protein:  125-135 grams   Fluid:  >/= 2.2 L/day     Jarome Matin,  MS, RD, LDN, [facility] Inpatient Clinical Dietitian Pager # [phone] After hours/weekend pager # [phone]

## 2018-01-12 NOTE — Care Management Note (Signed)
Case Management Note  Patient Details  Name: Tayari Yankee MRN: [id number] Date of Birth: [birth year]  Subjective/Objective:     Admitted with complete obstructing colon mass was taken to the or and colostomy performed.  Pod-1, npo ,ng tube due to ielus               Action/Plan: eill follow for hhc needs/none present at this time.  Expected Discharge Date:  01/13/18               Expected Discharge Plan:     In-House Referral:     Discharge planning Services     Post Acute Care Choice:    Choice offered to:     DME Arranged:    DME Agency:     HH Arranged:    HH Agency:     Status of Service:     If discussed at [employer], dates discussed:    Additional Comments:  Leeroy Cha, RN 01/12/2018, 10:40 AM

## 2018-01-12 NOTE — Addendum Note (Signed)
Addendum  created 01/12/18 0618 by Lollie Sails, CRNA   Charge Capture section accepted

## 2018-01-12 NOTE — Progress Notes (Signed)
PROGRESS NOTE  Dave Vaughn VXB:[id number] DOB: May 16, 1946 DOA: 01/10/2018 PCP: [name], FNP  HPI/Recap of past 24 hours: Dave Vaughn is a 72 y.o. male with medical history significant of diverticulitis.  Patient presents to ED at [facility] with c/o abd pain, vomiting.  Was seen last week for same symptoms, thought to be a GI bug at that time; however, symptoms have persisted so returns to ED. CT shows colonic obstruction though due to mass or stricture. Suspicion for neoplasia. GI following for possible colonoscopy and gen surgery for possible colectomy and colostomy within the next 24 hours. NG tube in place.  New onset A.Fib with rate 110. On cardizem drip.  01/12/2018: POD #1 post left colon resection, takedown splenic flexure and colostomy.  Patient seen and examined with his wife at his bedside.  His pain is well controlled.  He denies nausea.  No chest pain, palpitations, or dyspnea.   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 Fib with RVR On Cardizem drip In sinus rhythm on exam this morning Repeat twelve-lead EKG  Obtain 2 D echo to exclude any cardiac structural abnormalities CHADS VASC score 1 low-moderate risk- consider antiplatelet or anticoagulation however due to recent surgery will hold off Cardiology consult to initiate p.o. AV nodal blockade  Leukocytosis Suspect reactive post surgery Repeat CBC in the morning  Dehydration Start gentle IV fluid hydration Monitor urine output  CKD 3 Creatinine on presentation 1.1 with GFR greater than 60 Creatinine today 1.21 with GFR 58 Avoid nephrotoxic agents Repeat BMP in the  morning    Code Status: Full code  Family Communication: Wife at bedside  Disposition Plan: Home in 1 to 2 days when general surgery signs off   Consultants:  General surgery  GI  Cardiology  Procedures:  Left colon resection, takedown of splenic flexure and colostomy  Antimicrobials:  None  DVT prophylaxis: Subcu Lovenox daily   Objective: Vitals:   01/12/18 0900 01/12/18 1000 01/12/18 1100 01/12/18 1200  BP: 116/65 102/63 135/74 117/73  Pulse: (!) 187 98 92 (!) 108  Resp: (!) 21 15 (!) 24 13  Temp:    98.4 F (36.9 C)  TempSrc:    Axillary  SpO2: 97% 96% 93% 96%  Weight:      Height:        Intake/Output Summary (Last 24 hours) at 01/12/2018 1443 Last data filed at 01/12/2018 0800 Gross per 24 hour  Intake 4459.24 ml  Output 2675 ml  Net 1784.24 ml   Filed Weights   01/10/18 1822  Weight: 88.5 kg    Exam:  . General: 72 y.o. year-old male well developed well nourished in no acute distress.  Alert and oriented x3. . Cardiovascular: Regular rate and rhythm with no rubs or gallops.  No thyromegaly or JVD noted.   [name] Respiratory: Clear to auscultation with no wheezes or rales. Good inspiratory effort. . Abdomen: Soft nontender nondistended with soft bowel sounds x4 quadrants.  Colostomy in place in left mid quadrant with mild bowel content.  Staples at mid abdomen from surgery. . Musculoskeletal: No lower extremity edema. 2/4 pulses in all 4 extremities. . Skin: No ulcerative lesions noted or rashes, . Psychiatry:  Mood is appropriate for condition and setting   Data Reviewed: CBC: Recent Labs  Lab 01/10/18 1844 01/12/18 0335  WBC 5.5 12.7*  NEUTROABS 2.7  --   HGB 15.5 14.8  HCT 45.0 44.9  MCV 87.7 89.4  PLT 305 161   Basic Metabolic Panel: Recent Labs  Lab 01/10/18 1844 01/11/18 0435 01/12/18 0335  NA 135 138 142  K 3.4* 3.8 4.0  CL 97* 105 107  CO2 25 25 25   GLUCOSE 134* 98 124*  BUN 34* 27* 27*  CREATININE 1.33* 1.11 1.21   CALCIUM 8.6* 8.5* 8.1*  MG 2.2  --   --    GFR: Estimated Creatinine Clearance: 62.7 mL/min (by C-G formula based on SCr of 1.21 mg/dL). Liver Function Tests: Recent Labs  Lab 01/10/18 1844 01/12/18 0335  AST 42* 34  ALT 85* 59*  ALKPHOS 141* 86  BILITOT 1.2 1.6*  PROT 7.7 5.3*  ALBUMIN 4.0 2.6*   Recent Labs  Lab 01/10/18 1844  LIPASE 36   No results for input(s): AMMONIA in the last 168 hours. Coagulation Profile: No results for input(s): INR, PROTIME in the last 168 hours. Cardiac Enzymes: No results for input(s): CKTOTAL, CKMB, CKMBINDEX, TROPONINI in the last 168 hours. BNP (last 3 results) No results for input(s): PROBNP in the last 8760 hours. HbA1C: No results for input(s): HGBA1C in the last 72 hours. CBG: No results for input(s): GLUCAP in the last 168 hours. Lipid Profile: No results for input(s): CHOL, HDL, LDLCALC, TRIG, CHOLHDL, LDLDIRECT in the last 72 hours. Thyroid Function Tests: No results for input(s): TSH, T4TOTAL, FREET4, T3FREE, THYROIDAB in the last 72 hours. Anemia Panel: No results for input(s): VITAMINB12, FOLATE, FERRITIN, TIBC, IRON, RETICCTPCT in the last 72 hours. Urine analysis:    Component Value Date/Time   COLORURINE AMBER (A) 01/10/2018 1844   APPEARANCEUR CLOUDY (A) 01/10/2018 1844   LABSPEC >1.030 (H) 01/10/2018 1844   PHURINE 6.0 01/10/2018 1844   GLUCOSEU NEGATIVE 01/10/2018 1844   HGBUR LARGE (A) 01/10/2018 1844   BILIRUBINUR MODERATE (A) 01/10/2018 1844   KETONESUR 15 (A) 01/10/2018 1844   PROTEINUR >300 (A) 01/10/2018 1844   NITRITE NEGATIVE 01/10/2018 1844   LEUKOCYTESUR NEGATIVE 01/10/2018 1844   Sepsis Labs: @LABRCNTIP (procalcitonin:4,lacticidven:4)  )No results found for this or any previous visit (from the past 240 hour(s)).    Studies: No results found.  Scheduled Meds: . cefoTEtan (CEFOTAN) IV  1 g Intravenous Q12H  . enoxaparin (LOVENOX) injection  40 mg Subcutaneous Q24H  . feeding supplement  1  Container Oral BID BM  . feeding supplement  237 mL Oral BID BM    Continuous Infusions: . sodium chloride Stopped (01/11/18 2112)  . diltiazem (CARDIZEM) infusion 5 mg/hr (01/12/18 0800)  . famotidine (PEPCID) IV Stopped (01/12/18 1331)  . lactated ringers 75 mL/hr at 01/12/18 0800     LOS: 2 days     Kayleen Memos, MD [facility] Pager [phone]  If 7PM-7AM, please contact night-coverage [email or web address] Password [facility] 01/12/2018, 2:43 PM

## 2018-01-12 NOTE — Consult Note (Signed)
[place] Nurse ostomy consult note Stoma type/location: RUQ, end colostomy  Stomal assessment/size: aprox. 1 3/4" visualized through the pouch Peristomal assessment: see OCA notes from later today  Output minimal, brown stool  Ostomy pouching: 2pc.  Education provided:  Explained role of ostomy nurse and creation of stoma  Ostomy care associate to see patient a little later today to demonstrate pouch change to patient and his wife. Explained we would check on tomorrow to review education.  I have provided educational materials with pictures for 2pc pouch change and generalized educational materials for ostomy care.   Enrolled patient in [facility] program: Yes   [place] Nurse will follow along with you for continued support with ostomy teaching and care [place] MSN, [place], [place], [place], [place]

## 2018-01-12 NOTE — Progress Notes (Addendum)
Progress Note   Subjective  Chief Complaint: Bowel obstruction, colon mass, status post colon resection  Today, is doing "as well as I can".  Denies any new complaints or concerns.    Objective   Vital signs in last 24 hours: Temp:  [97.5 F (36.4 C)-98.8 F (37.1 C)] 97.9 F (36.6 C) (08/08 0800) Pulse Rate:  [67-126] 98 (08/08 0700) Resp:  [13-21] 13 (08/08 0700) BP: (91-142)/(61-99) 104/70 (08/08 0700) SpO2:  [92 %-100 %] 98 % (08/08 0700) Last BM Date: 01/10/18 General:    white male in NAD + NG tube in place Heart:  Regular rate and rhythm; no murmurs Lungs: Respirations even and unlabored, lungs CTA bilaterally Abdomen:  Soft, mild TTP around area of colostomy, fresh incision midline, clean bandage and nondistended. Normal bowel sounds. Extremities:  Without edema. Neurologic:  Alert and oriented,  grossly normal neurologically. Psych:  Cooperative. Normal mood and affect.  Intake/Output from previous day: 08/07 0701 - 08/08 0700 In: 3440.3 [I.V.:3190.3; IV Piggyback:250] Out: 2675 [Urine:425; Emesis/NG output:800; Stool:450; Blood:200] Intake/Output this shift: Total I/O In: 1018.9 [I.V.:919.5; IV Piggyback:99.4] Out: -   Lab Results: Recent Labs    01/10/18 1844 01/12/18 0335  WBC 5.5 12.7*  HGB 15.5 14.8  HCT 45.0 44.9  PLT 305 277   BMET Recent Labs    01/10/18 1844 01/11/18 0435 01/12/18 0335  NA 135 138 142  K 3.4* 3.8 4.0  CL 97* 105 107  CO2 25 25 25   GLUCOSE 134* 98 124*  BUN 34* 27* 27*  CREATININE 1.33* 1.11 1.21  CALCIUM 8.6* 8.5* 8.1*   LFT Recent Labs    01/12/18 0335  PROT 5.3*  ALBUMIN 2.6*  AST 34  ALT 59*  ALKPHOS 86  BILITOT 1.6*   Studies/Results: Ct Abdomen Pelvis W Contrast  Result Date: 01/10/2018 CLINICAL DATA:  Abdominal distention with nausea, vomiting and diarrhea 4 days. History of diverticulitis. EXAM: CT ABDOMEN AND PELVIS WITH CONTRAST TECHNIQUE: Multidetector CT imaging of the abdomen and pelvis was  performed using the standard protocol following bolus administration of intravenous contrast. CONTRAST:  168mL ISOVUE-300 IOPAMIDOL (ISOVUE-300) INJECTION 61% COMPARISON:  02/26/2014 FINDINGS: Lower chest: No acute abnormality. Hepatobiliary: Subcentimeter hypodensity over the right lobe of the liver too small to characterize but likely a cyst. Biliary tree is within normal. Possible minimal cholelithiasis. Pancreas: Normal. Spleen: Normal. Adrenals/Urinary Tract: Adrenal glands are normal. Kidneys normal size without hydronephrosis or nephrolithiasis. There is a stable 1.6 cm hypodensity over the lower pole right kidney unchanged and likely a slightly hyperdense cyst. Ureters are normal. Mild increased density over the posterior dependent portion of the bladder which is in continuity with the adjacent mildly prominent prostate gland likely representing prostatic impression upon the bladder base although it would be difficult to exclude a intrinsic bladder mass. Stomach/Bowel: Stomach is normal. There multiple fluid and air-filled dilated small bowel loops to the level of the ileocecal valve measuring up to 4.8 cm in diameter. There is mild fluid and air-filled prominence of the colon to the level of the mid descending colon with there is an abrupt caliber change and associated irregular soft tissue density suggesting neoplastic stricture. This is likely the site of obstruction. Moderate diverticulosis of the distal descending and sigmoid colon. Appendix demonstrates mild mucosal enhancement and measures 1.2 cm in thickness at the tip. There is no adjacent inflammatory change or free fluid. Vascular/Lymphatic: Minimal atherosclerotic plaque over the abdominal aorta and iliac arteries. No adenopathy. Reproductive: Mild  prostatic enlargement. Other: No significant free fluid or free peritoneal air. Musculoskeletal: Degenerative change of the spine and hips. Multilevel disc disease throughout the lumbar spine.  IMPRESSION: Irregular soft tissue density causing stricture of the mid descending colon likely the site of obstruction for the dilated small bowel. 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 No evidence of perforation. Equivocal findings involving the appendix measuring 1.2 cm at the appendiceal tip with mucosal enhancement. No adjacent free fluid or inflammatory change. Findings are nonspecific, but can be seen with early acute appendicitis. Mild prostatic enlargement. Increased density over the posterior bladder base likely due to the large prostatic impression although cannot completely exclude a bladder mass. Urology protocol CT or ultrasound may be helpful for better evaluation. Mild cholelithiasis. Stable 1.5 cm cystic structure over the lower pole right kidney likely slightly hyperdense cyst. Diverticulosis of the colon. Aortic Atherosclerosis (ICD10-I70.0). These results were called by telephone at the time of interpretation on 01/10/2018 at 8:25 pm to Dr. Shirlyn Goltz , who verbally acknowledged these results. Electronically Signed   By: Marin Olp M.D.   On: 01/10/2018 20:25     Assessment / Plan:   Assessment: 1.  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  Discussed with patient that he will need repeat colonoscopy in 6 months or earlier pending when they plan for his colostomy takedown, this will happen prior to that 2.  We will have the patient return to the GI clinic in 3 months with Dr. [name] Plan, so this can be arranged 3.  We will sign off today.  Thank you for your kind consultation.   LOS: 2 days   Levin Erp  01/12/2018, 9:35 AM   Attending physician's note   I have taken an interval history, reviewed the chart and examined  the patient. I agree with the Advanced Practitioner's note, impression and recommendations.  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  Unprepped Colonoscopy prior to surgery was incomplete due to completely obstructing lesion.  Pathology staging awaited.  Plan: Follow-up with Dr. [name] Plan in 3 months.  He would need colonoscopy prior to reanastomosis. Will sign off.  Carmell Austria, MD

## 2018-01-12 NOTE — Progress Notes (Addendum)
1 Day Post-Op  Subjective: Stable and alert.  Some breakthrough pain.  Morphine frequency adjusted to q. one hour Wife present Operative findings explained to patient  Remains on Cardizem drip and atrial fibrillation.  This is new onset.  Being managed by [facility].  He will need to stay in stepdown unit until off Cardizem drip, I am told. Due to bowel obstruction and ileus, I do not think we can rely on GI absorption of oral medications for another 24 hours at least. Afebrile.  Heart rate 105, irregular.  SPO2 97%.  BP 103/65. Urine output a little low.  NG output bilious.  Low volume. Has had a little bit of stool in ostomy bag.  Hemoglobin 14.8.  Potassium 4.0.  Glucose 124.  Creatinine 1.21.  Objective: Vital signs in last 24 hours: Temp:  [97.5 F (36.4 C)-98.8 F (37.1 C)] 98.6 F (37 C) (08/08 0400) Pulse Rate:  [67-126] 101 (08/08 0300) Resp:  [14-21] 19 (08/08 0300) BP: (91-142)/(63-99) 103/65 (08/08 0300) SpO2:  [92 %-100 %] 97 % (08/08 0300) Last BM Date: 01/10/18  Intake/Output from previous day: 08/07 0701 - 08/08 0700 In: 3440.3 [I.V.:3190.3; IV Piggyback:250] Out: 2275 [Urine:75; Emesis/NG output:800; Stool:400; Blood:200] Intake/Output this shift: Total I/O In: 290.3 [I.V.:190.3; IV Piggyback:100] Out: 200 [Stool:200]   EXAM: General appearance: Alert.  Mild distress from incisional pain.  Cooperative.  Mental status otherwise normal. Resp: clear to auscultation bilaterally GI: Soft.  Less distended.  Appropriately tender.  Midline incision dressing dry.  Ostomy pink. Extremities: extremities normal, atraumatic, no cyanosis or edema  Lab Results:  Results for orders placed or performed during the hospital encounter of 01/10/18 (from the past 24 hour(s))  Type and screen [place]     Status: None   Collection Time: 01/11/18 12:45 PM  Result Value Ref Range   ABO/RH(D) O POS    Antibody Screen NEG    Sample Expiration     01/14/2018 Performed at [facility], [address], [place], [place] [address]   ABO/Rh     Status: None   Collection Time: 01/11/18 12:51 PM  Result Value Ref Range   ABO/RH(D)      O POS Performed at [facility], [place] [address], [place], [place] [address]   CBC     Status: Abnormal   Collection Time: 01/12/18  3:35 AM  Result Value Ref Range   WBC 12.7 (H) 4.0 - 10.5 K/uL   RBC 5.02 4.22 - 5.81 MIL/uL   Hemoglobin 14.8 13.0 - 17.0 g/dL   HCT 44.9 39.0 - 52.0 %   MCV 89.4 78.0 - 100.0 fL   MCH 29.5 26.0 - 34.0 pg   MCHC 33.0 30.0 - 36.0 g/dL   RDW 12.5 11.5 - 15.5 %   Platelets 277 150 - 400 K/uL  Comprehensive metabolic panel     Status: Abnormal   Collection Time: 01/12/18  3:35 AM  Result Value Ref Range   Sodium 142 135 - 145 mmol/L   Potassium 4.0 3.5 - 5.1 mmol/L   Chloride 107 98 - 111 mmol/L   CO2 25 22 - 32 mmol/L   Glucose, Bld 124 (H) 70 - 99 mg/dL   BUN 27 (H) 8 - 23 mg/dL   Creatinine, Ser 1.21 0.61 - 1.24 mg/dL   Calcium 8.1 (L) 8.9 - 10.3 mg/dL   Total Protein 5.3 (L) 6.5 - 8.1 g/dL   Albumin 2.6 (L) 3.5 - 5.0  g/dL   AST 34 15 - 41 U/L   ALT 59 (H) 0 - 44 U/L   Alkaline Phosphatase 86 38 - 126 U/L   Total Bilirubin 1.6 (H) 0.3 - 1.2 mg/dL   GFR calc non Af Amer 58 (L) >60 mL/min   GFR calc Af Amer >60 >60 mL/min   Anion gap 10 5 - 15     Studies/Results: No results found.  . enoxaparin (LOVENOX) injection  40 mg Subcutaneous Q24H     Assessment/Plan: s/p Procedure(s): LEFT COLON RESECTION, TAKEDOWN SPLENIC FLEXURE, COLOSTOMY COLONOSCOPY  POD #1.  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 Stable Up to chair PT consult Continue NG tube due to significant ileus Incentive spirometry Increased frequency of narcotic analgesics for now. WOC consulted.  Stoma had to be placed higher on abdominal wall due to level of  resection and inability to bring down as planned.  This should work fine as temporary colostomy  Atrial fibrillation, new onset.  Rate seems fairly well controlled with Cardizem drip. Will remain in SDU until off drips. Atrial fibrillation managed by [facility]  Lovenox for DVT prophylaxis  @PROBHOSP @  LOS: 2 days    [name] 01/12/2018  . .prob

## 2018-01-12 NOTE — Evaluation (Signed)
Physical Therapy Evaluation Patient Details Name: Dave Vaughn MRN: [id number] DOB: [birth year] Today's Date: 01/12/2018   History of Present Illness  Pt is a 72 year old male s/p Open left colon resection, takedown splenic flexure, end colostomy left transverse colon and new onset afib  Clinical Impression  Pt admitted with above diagnosis. Pt currently with functional limitations due to the deficits listed below (see PT Problem List).  Pt will benefit from skilled PT to increase their independence and safety with mobility to allow discharge to the venue listed below.  Pt very agreeable to mobilize however fatigue and HR limiting distance.  Pt will likely progress to no needs or f/u PT as he was independent and working prior to admission.     Follow Up Recommendations No PT follow up    Equipment Recommendations  Rolling walker with 5" wheels(may progress to no needs)    Recommendations for Other Services       Precautions / Restrictions Precautions Precautions: Fall Precaution Comments: monitor HR, R UQ colostomy, NG tube      Mobility  Bed Mobility Overal bed mobility: Needs Assistance Bed Mobility: Rolling;Sidelying to Sit;Sit to Sidelying Rolling: Min assist Sidelying to sit: Min assist     Sit to sidelying: Min assist General bed mobility comments: assist for log roll technique, pt had difficulty with sidelying  Transfers Overall transfer level: Needs assistance Equipment used: Rolling walker (2 wheeled) Transfers: Sit to/from Stand Sit to Stand: Min assist         General transfer comment: assist to rise and steady  Ambulation/Gait Ambulation/Gait assistance: Min guard Gait Distance (Feet): 80 Feet Assistive device: Rolling walker (2 wheeled) Gait Pattern/deviations: Step-through pattern;Decreased stride length     General Gait Details: distance to tolerance, cues for use of RW, monitored HR, mostly remained in 140s bpm however briefly as high as 163 bpm  (RN aware), standing rest breaks for high HR  Stairs            Wheelchair Mobility    Modified Rankin (Stroke Patients Only)       Balance                                             Pertinent Vitals/Pain Pain Assessment: 0-10 Pain Score: 3  Pain Location: abdomen Pain Descriptors / Indicators: Discomfort;Operative site guarding Pain Intervention(s): Limited activity within patient's tolerance;Monitored during session;Repositioned;Premedicated before session    Home Living Family/patient expects to be discharged to:: Private residence Living Arrangements: Spouse/significant other   Type of Home: House Home Access: Level entry     Home Layout: Able to live on main level with bedroom/bathroom Home Equipment: None      Prior Function Level of Independence: Independent               Hand Dominance        Extremity/Trunk Assessment        Lower Extremity Assessment Lower Extremity Assessment: Overall WFL for tasks assessed    Cervical / Trunk Assessment Cervical / Trunk Assessment: Normal  Communication   Communication: No difficulties  Cognition Arousal/Alertness: Awake/alert Behavior During Therapy: WFL for tasks assessed/performed Overall Cognitive Status: Within Functional Limits for tasks assessed  General Comments      Exercises     Assessment/Plan    PT Assessment Patient needs continued PT services  PT Problem List Decreased mobility;Decreased activity tolerance;Decreased knowledge of use of DME       PT Treatment Interventions DME instruction;Therapeutic activities;Gait training;Therapeutic exercise;Patient/family education;Functional mobility training;Stair training    PT Goals (Current goals can be found in the Care Plan section)  Acute Rehab PT Goals PT Goal Formulation: With patient/family Time For Goal Achievement: 01/26/18 Potential to Achieve  Goals: Good    Frequency Min 3X/week   Barriers to discharge        Co-evaluation               AM-PAC PT "6 Clicks" Daily Activity  Outcome Measure Difficulty turning over in bed (including adjusting bedclothes, sheets and blankets)?: Unable Difficulty moving from lying on back to sitting on the side of the bed? : Unable Difficulty sitting down on and standing up from a chair with arms (e.g., wheelchair, bedside commode, etc,.)?: Unable Help needed moving to and from a bed to chair (including a wheelchair)?: A Little Help needed walking in hospital room?: A Little Help needed climbing 3-5 steps with a railing? : A Lot 6 Click Score: 11    End of Session   Activity Tolerance: Patient tolerated treatment well Patient left: in bed;with call bell/phone within reach;with bed alarm set;with family/visitor present Nurse Communication: Mobility status PT Visit Diagnosis: Difficulty in walking, not elsewhere classified (R26.2)    Time: 3009-2330 PT Time Calculation (min) (ACUTE ONLY): 24 min   Charges:   PT Evaluation $PT Eval Moderate Complexity: 1 Mod         Carmelia Bake, PT, DPT 01/12/2018 Pager: [phone]   York Ram E 01/12/2018, 12:35 PM

## 2018-01-12 NOTE — Progress Notes (Signed)
  Echocardiogram 2D Echocardiogram has been performed.  Dave Vaughn 01/12/2018, 3:55 PM

## 2018-01-13 LAB — BASIC METABOLIC PANEL
Anion gap: 9 (ref 5–15)
BUN: 23 mg/dL (ref 8–23)
CALCIUM: 8.3 mg/dL — AB (ref 8.9–10.3)
CO2: 27 mmol/L (ref 22–32)
CREATININE: 1.15 mg/dL (ref 0.61–1.24)
Chloride: 105 mmol/L (ref 98–111)
GFR calc non Af Amer: >60 mL/min (ref >60)
Glucose, Bld: 105 mg/dL — ABNORMAL HIGH (ref 70–99)
Potassium: 3.6 mmol/L (ref 3.5–5.1)
Sodium: 141 mmol/L (ref 135–145)

## 2018-01-13 LAB — MRSA PCR SCREENING: MRSA BY PCR: NEGATIVE

## 2018-01-13 LAB — CBC
HEMATOCRIT: 39.4 % (ref 39.0–52.0)
HEMOGLOBIN: 12.9 g/dL — AB (ref 13.0–17.0)
MCH: 29.7 pg (ref 26.0–34.0)
MCHC: 32.7 g/dL (ref 30.0–36.0)
MCV: 90.6 fL (ref 78.0–100.0)
Platelets: 228 10*3/uL (ref 150–400)
RBC: 4.35 MIL/uL (ref 4.22–5.81)
RDW: 12.6 % (ref 11.5–15.5)
WBC: 8.8 10*3/uL (ref 4.0–10.5)

## 2018-01-13 LAB — HEPARIN LEVEL (UNFRACTIONATED): Heparin Unfractionated: 0.14 IU/mL — ABNORMAL LOW (ref 0.30–0.70)

## 2018-01-13 LAB — MAGNESIUM: Magnesium: 2.1 mg/dL (ref 1.7–2.4)

## 2018-01-13 MED ORDER — DILTIAZEM HCL ER COATED BEADS 180 MG PO CP24: 180.0000 mg | ORAL_CAPSULE | Freq: Every day | ORAL | Status: DC

## 2018-01-13 MED ORDER — AMIODARONE LOAD VIA INFUSION
150.0000 mg | Freq: Once | INTRAVENOUS | Status: AC
  Administered 2018-01-13: 150 mg via INTRAVENOUS
  Filled 2018-01-13: qty 83.34

## 2018-01-13 MED ORDER — AMIODARONE HCL IN DEXTROSE 360-4.14 MG/200ML-% IV SOLN
60.0000 mg/h | INTRAVENOUS | Status: AC
  Administered 2018-01-13 (×2): 60 mg/h via INTRAVENOUS
  Filled 2018-01-13 (×2): qty 200

## 2018-01-13 MED ORDER — HEPARIN (PORCINE) IN NACL 100-0.45 UNIT/ML-% IJ SOLN
1600.0000 [IU]/h | INTRAMUSCULAR | Status: DC
  Administered 2018-01-13: 1250 [IU]/h via INTRAVENOUS
  Filled 2018-01-13: qty 250

## 2018-01-13 MED ORDER — AMIODARONE IV BOLUS ONLY 150 MG/100ML: 150.0000 mg | Freq: Once | INTRAVENOUS | Status: DC

## 2018-01-13 MED ORDER — AMIODARONE HCL IN DEXTROSE 360-4.14 MG/200ML-% IV SOLN
30.0000 mg/h | INTRAVENOUS | Status: DC
  Administered 2018-01-13: 30 mg/h via INTRAVENOUS

## 2018-01-13 NOTE — Progress Notes (Signed)
Physical Therapy Treatment Patient Details Name: Dave Vaughn MRN: [id number] DOB: June 29, 1945 Today's Date: 01/13/2018    History of Present Illness Pt is a 72 year old male s/p Open left colon resection, takedown splenic flexure, end colostomy left transverse colon and new onset afib    PT Comments    Assisted pt OOB to amb a great distance.  General Gait Details: used walker for safety only otherwise good alternating gait tolerated an increased distance.  HR range 119 - 147 asymtomatic.  Static standing at end x 2 min to use urinal.  No c/o's.    Follow Up Recommendations  No PT follow up     Equipment Recommendations  Rolling walker with 5" wheels(may progress and not need)    Recommendations for Other Services       Precautions / Restrictions Precautions Precautions: None Precaution Comments: monitor HR, R UQ colostomy Restrictions Weight Bearing Restrictions: No    Mobility  Bed Mobility Overal bed mobility: Needs Assistance Bed Mobility: Rolling;Sidelying to Sit;Sit to Sidelying Rolling: Min assist Sidelying to sit: Min assist     Sit to sidelying: Mod assist General bed mobility comments: assist for log roll technique, pt had difficulty with sidelying and extra assist B LE's back onto bed  Transfers Overall transfer level: Needs assistance Equipment used: Rolling walker (2 wheeled) Transfers: Sit to/from Stand Sit to Stand: Min guard         General transfer comment: assist with lines/equipment  Ambulation/Gait Ambulation/Gait assistance: Min guard Gait Distance (Feet): 185 Feet Assistive device: Rolling walker (2 wheeled)   Gait velocity: decreased    General Gait Details: used walker for safety only otherwise good alternating gait tolerated an increased distance.  HR range 119 - 147 asymtomatic.  Static standing at end x 2 min to use urinal.  No c/o's.     Stairs             Wheelchair Mobility    Modified Rankin (Stroke Patients  Only)       Balance                                            Cognition Arousal/Alertness: Awake/alert Behavior During Therapy: WFL for tasks assessed/performed Overall Cognitive Status: Within Functional Limits for tasks assessed                                 General Comments: very motivated and pleasant      Exercises      General Comments        Pertinent Vitals/Pain Pain Assessment: 0-10 Pain Score: 2  Pain Location: abdomen Pain Descriptors / Indicators: Sore Pain Intervention(s): Premedicated before session;Monitored during session    Home Living                      Prior Function            PT Goals (current goals can now be found in the care plan section) Progress towards PT goals: Progressing toward goals    Frequency    Min 3X/week      PT Plan Current plan remains appropriate    Co-evaluation              AM-PAC PT "6 Clicks" Daily Activity  Outcome Measure  Difficulty  turning over in bed (including adjusting bedclothes, sheets and blankets)?: A Lot Difficulty moving from lying on back to sitting on the side of the bed? : A Lot Difficulty sitting down on and standing up from a chair with arms (e.g., wheelchair, bedside commode, etc,.)?: A Lot Help needed moving to and from a bed to chair (including a wheelchair)?: A Lot Help needed walking in hospital room?: A Lot Help needed climbing 3-5 steps with a railing? : A Lot 6 Click Score: 12    End of Session Equipment Utilized During Treatment: Gait belt Activity Tolerance: Patient tolerated treatment well Patient left: in bed;with call bell/phone within reach;with bed alarm set;with family/visitor present Nurse Communication: Mobility status PT Visit Diagnosis: Difficulty in walking, not elsewhere classified (R26.2)     Time: 8841-6606 PT Time Calculation (min) (ACUTE ONLY): 26 min  Charges:  $Gait Training: 8-22 mins $Therapeutic  Exercise: 8-22 mins                     Rica Koyanagi  PTA [facility]  Acute  Rehab Pager      [phone]

## 2018-01-13 NOTE — Progress Notes (Signed)
PROGRESS NOTE  Dave Vaughn YBO:[id number] DOB: 10-25-1945 DOA: 01/10/2018 PCP: [name], FNP  HPI/Recap of past 24 hours: Dave Vaughn is a 72 y.o. male with medical history significant of diverticulitis.  Patient presents to ED at [facility] with c/o abd pain, vomiting.  Was seen last week for same symptoms, thought to be a GI bug at that time; however, symptoms have persisted so returns to ED. CT shows colonic obstruction though due to mass or stricture. Suspicion for neoplasia. GI following for possible colonoscopy and gen surgery for possible colectomy and colostomy within the next 24 hours. NG tube in place.  New onset A.Fib with rate 110. On cardizem drip.  01/12/2018: POD #1 post left colon resection, takedown splenic flexure and colostomy.  Patient seen and examined with his wife at his bedside.  His pain is well controlled.  He denies nausea.  No chest pain, palpitations, or dyspnea.  01/13/2018: Seen and examined with his wife at bedside.  Pain is well controlled.  He denies chest pain, dyspnea, or palpitations.  Cardiology consulted and following.  Highly appreciated.   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 Fib with RVR On Cardizem drip Okay to start heparin drip per general surgery Dr. [name] In A. fib this morning 2D echo done on 01/12/2018 with no reported abnormalities CHADS VASC score 1 low-moderate risk-  Start amiodarone loading dose and maintenance dose Start heparin drip Cardiology consulted, Dr [name].  Highly appreciated.  Leukocytosis, resolved Suspect reactive post surgery Repeat CBC in  the morning  Mild acute blood loss anemia Hemoglobin 12.9 from 14.8 at baseline Continue to monitor H&H Repeat CBC in the morning  Dehydration Continue gentle IV fluid hydration Continue to monitor urine output  CKD 3 Creatinine on presentation 1.1 with GFR greater than 60 Creatinine today 1.15 from 1.21 with GFR 58 Avoid nephrotoxic agents Repeat BMP in the morning    Code Status: Full code  Family Communication: Wife at bedside  Disposition Plan: Home in 1 to 2 days went to neurosurgery and cardiology sign off.    Consultants:  General surgery  GI  Cardiology  Procedures:  Left colon resection, takedown of splenic flexure and colostomy  Antimicrobials:  None  DVT prophylaxis: Subcu Lovenox daily   Objective: Vitals:   01/13/18 1000 01/13/18 1100 01/13/18 1200 01/13/18 1500  BP: 119/72 136/70  126/73  Pulse: (!) 110 (!) 113  93  Resp: 17 (!) 21  20  Temp:   98.6 F (37 C)   TempSrc:   Oral   SpO2: 93% 94%  94%  Weight:      Height:        Intake/Output Summary (Last 24 hours) at 01/13/2018 1606 Last data filed at 01/13/2018 1500 Gross per 24 hour  Intake 2147.42 ml  Output 2700 ml  Net -552.58 ml   Filed Weights   01/10/18 1822  Weight: 88.5 kg    Exam:  . General: 72 y.o. year-old male well-developed well-nourished in no acute distress.  Alert and oriented x3.   . Cardiovascular: Regular rate and rhythm with no rubs or gallops.  No thyromegaly or JVD noted. [name] Respiratory:  Clear to auscultation with no wheezes or rales.  Good inspiratory effort.   . Abdomen: Soft nontender nondistended with soft bowel sounds x4 quadrants.  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  Staples at mid abdomen from surgery with no drainage. . Musculoskeletal: No lower extremity edema. 2/4 pulses in all 4 extremities. [name] Psychiatry: Mood is appropriate for condition and setting   Data Reviewed: CBC: Recent Labs  Lab  01/10/18 1844 01/12/18 0335 01/13/18 0343  WBC 5.5 12.7* 8.8  NEUTROABS 2.7  --   --   HGB 15.5 14.8 12.9*  HCT 45.0 44.9 39.4  MCV 87.7 89.4 90.6  PLT 305 277 818   Basic Metabolic Panel: Recent Labs  Lab 01/10/18 1844 01/11/18 0435 01/12/18 0335 01/13/18 0343  NA 135 138 142 141  K 3.4* 3.8 4.0 3.6  CL 97* 105 107 105  CO2 25 25 25 27   GLUCOSE 134* 98 124* 105*  BUN 34* 27* 27* 23  CREATININE 1.33* 1.11 1.21 1.15  CALCIUM 8.6* 8.5* 8.1* 8.3*  MG 2.2  --   --  2.1   GFR: Estimated Creatinine Clearance: 66 mL/min (by C-G formula based on SCr of 1.15 mg/dL). Liver Function Tests: Recent Labs  Lab 01/10/18 1844 01/12/18 0335  AST 42* 34  ALT 85* 59*  ALKPHOS 141* 86  BILITOT 1.2 1.6*  PROT 7.7 5.3*  ALBUMIN 4.0 2.6*   Recent Labs  Lab 01/10/18 1844  LIPASE 36   No results for input(s): AMMONIA in the last 168 hours. Coagulation Profile: No results for input(s): INR, PROTIME in the last 168 hours. Cardiac Enzymes: No results for input(s): CKTOTAL, CKMB, CKMBINDEX, TROPONINI in the last 168 hours. BNP (last 3 results) No results for input(s): PROBNP in the last 8760 hours. HbA1C: No results for input(s): HGBA1C in the last 72 hours. CBG: No results for input(s): GLUCAP in the last 168 hours. Lipid Profile: No results for input(s): CHOL, HDL, LDLCALC, TRIG, CHOLHDL, LDLDIRECT in the last 72 hours. Thyroid Function Tests: No results for input(s): TSH, T4TOTAL, FREET4, T3FREE, THYROIDAB in the last 72 hours. Anemia Panel: No results for input(s): VITAMINB12, FOLATE, FERRITIN, TIBC, IRON, RETICCTPCT in the last 72 hours. Urine analysis:    Component Value Date/Time   COLORURINE AMBER (A) 01/10/2018 1844   APPEARANCEUR CLOUDY (A) 01/10/2018 1844   LABSPEC >1.030 (H) 01/10/2018 1844   PHURINE 6.0 01/10/2018 1844   GLUCOSEU NEGATIVE 01/10/2018 1844   HGBUR LARGE (A) 01/10/2018 1844   BILIRUBINUR MODERATE (A) 01/10/2018 1844   KETONESUR 15 (A) 01/10/2018  1844   PROTEINUR >300 (A) 01/10/2018 1844   NITRITE NEGATIVE 01/10/2018 1844   LEUKOCYTESUR NEGATIVE 01/10/2018 1844   Sepsis Labs: @LABRCNTIP (procalcitonin:4,lacticidven:4)  ) Recent Results (from the past 240 hour(s))  MRSA PCR Screening     Status: None   Collection Time: 01/13/18  9:11 AM  Result Value Ref Range Status   MRSA by PCR NEGATIVE NEGATIVE Final    Comment:        The GeneXpert MRSA Assay (FDA approved for NASAL specimens only), is one component of a comprehensive MRSA colonization surveillance program. It is not intended to diagnose MRSA infection nor to guide or monitor treatment for MRSA infections. Performed at [facility], [place] [address] [place], [place] [address]       Studies: No results found.  Scheduled Meds: . cefoTEtan (CEFOTAN) IV  1 g Intravenous Q12H  . feeding supplement  1 Container Oral BID BM  . feeding supplement  237 mL Oral BID BM    Continuous Infusions: . sodium chloride Stopped (01/11/18 2112)  . amiodarone 60 mg/hr (01/13/18 1520)   Followed by  . amiodarone    . famotidine (PEPCID) IV Stopped (01/13/18 1159)  . heparin 1,250 Units/hr (01/13/18 1142)  . lactated ringers 75 mL/hr at 01/13/18 1521     LOS: 3 days     Kayleen Memos, MD [facility] Pager [phone]  If 7PM-7AM, please contact night-coverage [email or web address] Password [email or web address] 01/13/2018, 4:06 PM

## 2018-01-13 NOTE — Consult Note (Signed)
[place] Nurse ostomy follow up F/U visit completed in [place]. Spouse and male visitor present Stoma type/location: LUQ Pouch placed yesterday intact.  Stoma producing pudding consistency brown stool.  No leakage.  Supplies at bedside. Two new teaching items covered were when to empty and to always carry extra supplies with him. No questions from patient or spouse.  Spouse states she has not yet read the materials provided, but that she would. Val Riles, RN, MSN, CWOCN, CNS-BC, pager [phone]

## 2018-01-13 NOTE — Consult Note (Signed)
Reason for Consult:A. Fib with RVR Referring Physician:[facility]  Dave Vaughn is an 72 y.o. male.  HPI: patient is 72 year old male with past medical history significant for diverticulitis, was admitted because of vague abdominal pain associated with nausea, vomiting, and prior history of colostomy weeks associated with poor appetite and 20 pounds weight loss in 3 weeks and was noted to have colonic obstruction, subsequently requiring left colon resection/takedown splenic flexure and colostomy.patient developed A. Fib with RVR in the ED was started on IV Cardizem with moderate control of heart rate and occasional episodes of rapid ventricular response.  Patient chest pain or shortness of breath.  Denies palpitations, lightheadedness or syncope.  Denies any history of thyroid problems.  Denies history of rheumatic fever as child.  Past Medical History:  Diagnosis Date  . Diverticulitis     Past Surgical History:  Procedure Laterality Date  . ANKLE SURGERY     when he was in college  . COLON RESECTION N/A 01/11/2018   Procedure: LEFT COLON RESECTION, TAKEDOWN SPLENIC FLEXURE, COLOSTOMY;  Surgeon: [name], MD;  Location: [facility];  Service: General;  Laterality: N/A;  . COLONOSCOPY  01/11/2018   Procedure: COLONOSCOPY;  Surgeon: Jackquline Denmark, MD;  Location: [facility];  Service: Endoscopy;;  . thumb surgery       History reviewed. No pertinent family history.  Social History:  reports that he has never smoked. He has never used smokeless tobacco. He reports that he drinks alcohol. His drug history is not on file.  Allergies: No Known Allergies  Medications: I have reviewed the patient's current medications.  Results for orders placed or performed during the hospital encounter of 01/10/18 (from the past 48 hour(s))  Prealbumin     Status: Abnormal   Collection Time: 01/12/18  3:35 AM  Result Value Ref Range   Prealbumin 13.6 (L) 18 - 38 mg/dL    Comment: Performed at [facility] [address], [place], [place] [address]  CBC     Status: Abnormal   Collection Time: 01/12/18  3:35 AM  Result Value Ref Range   WBC 12.7 (H) 4.0 - 10.5 K/uL   RBC 5.02 4.22 - 5.81 MIL/uL   Hemoglobin 14.8 13.0 - 17.0 g/dL   HCT 44.9 39.0 - 52.0 %   MCV 89.4 78.0 - 100.0 fL   MCH 29.5 26.0 - 34.0 pg   MCHC 33.0 30.0 - 36.0 g/dL   RDW 12.5 11.5 - 15.5 %   Platelets 277 150 - 400 K/uL    Comment: Performed at [facility], [place] [address] [place], [place] [address]  Comprehensive metabolic panel     Status: Abnormal   Collection Time: 01/12/18  3:35 AM  Result Value Ref Range   Sodium 142 135 - 145 mmol/L   Potassium 4.0 3.5 - 5.1 mmol/L   Chloride 107 98 - 111 mmol/L   CO2 25 22 - 32 mmol/L   Glucose, Bld 124 (H) 70 - 99 mg/dL   BUN 27 (H) 8 - 23 mg/dL   Creatinine, Ser 1.21 0.61 - 1.24 mg/dL   Calcium 8.1 (L) 8.9 - 10.3 mg/dL   Total Protein 5.3 (L) 6.5 - 8.1 g/dL   Albumin 2.6 (L) 3.5 - 5.0 g/dL   AST 34 15 - 41 U/L   ALT 59 (H) 0 - 44 U/L   Alkaline Phosphatase 86 38 - 126 U/L   Total Bilirubin 1.6 (H) 0.3 - 1.2 mg/dL  GFR calc non Af Amer 58 (L) >60 mL/min   GFR calc Af Amer >60 >60 mL/min    Comment: (NOTE) The eGFR has been calculated using the CKD EPI equation. This calculation has not been validated in all clinical situations. eGFR's persistently <60 mL/min signify possible Chronic Kidney Disease.    Anion gap 10 5 - 15    Comment: Performed at [facility], [place] [address], [place], [place] [address]  CBC     Status: Abnormal   Collection Time: 01/13/18  3:43 AM  Result Value Ref Range   WBC 8.8 4.0 - 10.5 K/uL   RBC 4.35 4.22 - 5.81 MIL/uL   Hemoglobin 12.9 (L) 13.0 - 17.0 g/dL   HCT 39.4 39.0 - 52.0 %   MCV 90.6 78.0 - 100.0 fL   MCH 29.7 26.0 - 34.0 pg   MCHC 32.7 30.0 - 36.0 g/dL   RDW 12.6 11.5 - 15.5 %   Platelets 228 150 - 400 K/uL    Comment: Performed at [facility], [place]  [address] [place], [place] [address]  Basic metabolic panel     Status: Abnormal   Collection Time: 01/13/18  3:43 AM  Result Value Ref Range   Sodium 141 135 - 145 mmol/L   Potassium 3.6 3.5 - 5.1 mmol/L   Chloride 105 98 - 111 mmol/L   CO2 27 22 - 32 mmol/L   Glucose, Bld 105 (H) 70 - 99 mg/dL   BUN 23 8 - 23 mg/dL   Creatinine, Ser 1.15 0.61 - 1.24 mg/dL   Calcium 8.3 (L) 8.9 - 10.3 mg/dL   GFR calc non Af Amer >60 >60 mL/min   GFR calc Af Amer >60 >60 mL/min    Comment: (NOTE) The eGFR has been calculated using the CKD EPI equation. This calculation has not been validated in all clinical situations. eGFR's persistently <60 mL/min signify possible Chronic Kidney Disease.    Anion gap 9 5 - 15    Comment: Performed at [facility], [place] [address], [place], [place] [address]  Magnesium     Status: None   Collection Time: 01/13/18  3:43 AM  Result Value Ref Range   Magnesium 2.1 1.7 - 2.4 mg/dL    Comment: Performed at [facility], [place] [address], [place], [place] [address]  MRSA PCR Screening     Status: None   Collection Time: 01/13/18  9:11 AM  Result Value Ref Range   MRSA by PCR NEGATIVE NEGATIVE    Comment:        The GeneXpert MRSA Assay (FDA approved for NASAL specimens only), is one component of a comprehensive MRSA colonization surveillance program. It is not intended to diagnose MRSA infection nor to guide or monitor treatment for MRSA infections. Performed at [facility], [place] [address], [place], [place] [address]     No results found.  Review of Systems  Constitutional: Positive for weight loss.  Eyes: Negative for blurred vision.  Respiratory: Negative for cough, sputum production and shortness of breath.   Cardiovascular: Negative for chest pain, palpitations, orthopnea, claudication, leg swelling and PND.  Gastrointestinal: Positive for abdominal pain, nausea and vomiting.  Genitourinary:  Negative for dysuria.  Skin: Negative for itching and rash.  Neurological: Negative for dizziness.   Blood pressure 136/70, pulse (!) 113, temperature 98.6 F (37 C), temperature source Oral, resp. rate (!) 21, height _0  (1.778 m), weight 88.5 kg,  SpO2 94 %. Physical Exam  Constitutional: He is oriented to person, place, and time.  HENT:  Head: Normocephalic.  Eyes: Pupils are equal, round, and reactive to light. Conjunctivae are normal. No scleral icterus.  Neck: Normal range of motion. Neck supple. No JVD present. No tracheal deviation present. No thyromegaly present.  Cardiovascular:  Tachycardic, irregularly irregular, S1, S2 soft.  There is soft systolic murmur noted  Respiratory: Effort normal and breath sounds normal. No respiratory distress. He has no wheezes. He has no rales.  GI:  Soft, mildly distended.  Bowel sounds faint.  Surgical dressing dry.  Colostomy bag noted  Musculoskeletal: He exhibits no edema, tenderness or deformity.  No evidence of DVT  Neurological: He is alert and oriented to person, place, and time.    Assessment/Plan: New-onset A. Fib with RVR chads vasc score of1 Status post colonic obstruction, status post open left colon resection, takedown splenic flexure and colostomy. Plan Agree with IV amiodarone as per orders. Start heparin if okay with surgery. Patient will not require long-term anticoagulation. Check EKG Check TSH [name] 01/13/2018, 1:15 PM

## 2018-01-13 NOTE — Progress Notes (Signed)
ANTICOAGULATION CONSULT NOTE - Initial Consult  Pharmacy Consult for heparin Indication: atrial fibrillation  No Known Allergies  Patient Measurements: Height: 5\' 10"  (177.8 cm) Weight: 195 lb (88.5 kg) IBW/kg (Calculated) : 73 Heparin Dosing Weight: 88.5 kg  Vital Signs: Temp: 99.2 F (37.3 C) (08/09 0800) Temp Source: Oral (08/09 0800) BP: 119/72 (08/09 1000) Pulse Rate: 110 (08/09 1000)  Labs: Recent Labs    01/10/18 1844 01/11/18 0435 01/12/18 0335 01/13/18 0343  HGB 15.5  --  14.8 12.9*  HCT 45.0  --  44.9 39.4  PLT 305  --  277 228  CREATININE 1.33* 1.11 1.21 1.15    Estimated Creatinine Clearance: 66 mL/min (by C-G formula based on SCr of 1.15 mg/dL).   Medical History: Past Medical History:  Diagnosis Date  . Diverticulitis     Medications:  Medications Prior to Admission  Medication Sig Dispense Refill Last Dose  . aspirin EC 81 MG tablet Take 81 mg by mouth daily.   Past Month at Unknown time  . loperamide (IMODIUM) 2 MG capsule Take 1 capsule (2 mg total) by mouth 4 (four) times daily as needed for diarrhea or loose stools. 12 capsule 0 Past Week at Unknown time  . Multiple Vitamin (MULTIVITAMIN WITH MINERALS) TABS tablet Take 1 tablet by mouth daily.   Past Month at Unknown time  . ondansetron (ZOFRAN ODT) 8 MG disintegrating tablet Take 1 tablet (8 mg total) by mouth every 8 (eight) hours as needed for nausea or vomiting. 12 tablet 0 Past Week at Unknown time  . simethicone (MYLICON) 465 MG chewable tablet Chew 125 mg by mouth every 6 (six) hours as needed for flatulence.   01/10/2018 at Unknown time    Assessment: 72 yo M POD #2 Open left colon resection, takedown splenic flexure, end colostomy left transverse colon. Pharmacy consulted to dose heparin for new onset afib.  On LMWH 40 qday for VTE px, last dose 8/8 1526.  Hg 14.8>>12.9, post op ABLA. Discussed with Dr. [name] do not bolus heparin.   Goal of Therapy:  Heparin level 0.3-0.7  units/ml Monitor platelets by anticoagulation protocol: Yes   Plan:  No bolus per Dr. [name] Start heparin drip at 1250 units/hr and check 8 hr HL Daily CBC and heparin level while on heparin drip  Eudelia Bunch, Pharm.D [phone] 01/13/2018 11:06 AM

## 2018-01-13 NOTE — Progress Notes (Signed)
2 Days Post-Op  Subjective: Stable and alert.  Ambulated in hall.  Still has NG and Foley. Passing flatus and a little stool in colostomy bag.  NG output record low. Pain control better Still on Cardizem drip. Still in atrial fibrillation Had echocardiogram yesterday, results pending [facility] plans cardiology consult  Potassium 3.8.  Glucose 105.  Creatinine 1.15, hemoglobin 12.9.  WBC now normal at 8.8. CEA 3.6.  Pathology pending   Objective: Vital signs in last 24 hours: Temp:  [97.4 F (36.3 C)-98.7 F (37.1 C)] 98.7 F (37.1 C) (08/09 0400) Pulse Rate:  [90-187] 100 (08/09 0506) Resp:  [13-24] 16 (08/09 0506) BP: (94-135)/(58-79) 94/58 (08/09 0500) SpO2:  [93 %-98 %] 97 % (08/09 0506) Last BM Date: 01/10/18  Intake/Output from previous day: 08/08 0701 - 08/09 0700 In: 3170.6 [I.V.:2841.2; NG/GT:30; IV Piggyback:299.4] Out: 750 [Urine:600; Emesis/NG output:50; Stool:100] Intake/Output this shift: Total I/O In: 824.8 [I.V.:724.8; IV Piggyback:100] Out: -     EXAM: General appearance: Alert.  Mild distress from incisional pain.  Cooperative.  Mental status otherwise normal. Resp: clear to auscultation bilaterally GI: Soft.  Less distended.  Appropriately tender.  Midline incision dressing dry.  Ostomy pink.  Lots of flatus and a little stool in bag.  Has some bowel sounds. Extremities: extremities normal, atraumatic, no cyanosis or edema   Lab Results:  Results for orders placed or performed during the hospital encounter of 01/10/18 (from the past 24 hour(s))  CBC     Status: Abnormal   Collection Time: 01/13/18  3:43 AM  Result Value Ref Range   WBC 8.8 4.0 - 10.5 K/uL   RBC 4.35 4.22 - 5.81 MIL/uL   Hemoglobin 12.9 (L) 13.0 - 17.0 g/dL   HCT 39.4 39.0 - 52.0 %   MCV 90.6 78.0 - 100.0 fL   MCH 29.7 26.0 - 34.0 pg   MCHC 32.7 30.0 - 36.0 g/dL   RDW 12.6 11.5 - 15.5 %   Platelets 228 150 - 400 K/uL  Basic metabolic panel     Status: Abnormal   Collection  Time: 01/13/18  3:43 AM  Result Value Ref Range   Sodium 141 135 - 145 mmol/L   Potassium 3.6 3.5 - 5.1 mmol/L   Chloride 105 98 - 111 mmol/L   CO2 27 22 - 32 mmol/L   Glucose, Bld 105 (H) 70 - 99 mg/dL   BUN 23 8 - 23 mg/dL   Creatinine, Ser 1.15 0.61 - 1.24 mg/dL   Calcium 8.3 (L) 8.9 - 10.3 mg/dL   GFR calc non Af Amer >60 >60 mL/min   GFR calc Af Amer >60 >60 mL/min   Anion gap 9 5 - 15  Magnesium     Status: None   Collection Time: 01/13/18  3:43 AM  Result Value Ref Range   Magnesium 2.1 1.7 - 2.4 mg/dL     Studies/Results: No results found.  . cefoTEtan (CEFOTAN) IV  1 g Intravenous Q12H  . enoxaparin (LOVENOX) injection  40 mg Subcutaneous Q24H  . feeding supplement  1 Container Oral BID BM  . feeding supplement  237 mL Oral BID BM     Assessment/Plan: s/p Procedure(s): LEFT COLON RESECTION, TAKEDOWN SPLENIC FLEXURE, COLOSTOMY COLONOSCOPY  POD #2.  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 Stable Discontinue NG and Foley Start clear liquid diet Oral medications okay.  Hopefully we can get him off Cardizem drip and transferred to  floor Incentive spirometry  WOC consulted.  Stoma had to be placed higher on abdominal wall due to level of resection and inability to bring down as planned.  This should work fine as temporary colostomy  Atrial fibrillation, new onset.  Rate seems fairly well controlled with Cardizem drip. Now that NG off hopefully can transition to oral medication and transfer to floor Will remain in SDU until off drips. Atrial fibrillation managed by [facility] hospitalist Cardiology consult planned. Echo done yesterday  Lovenox for DVT prophylaxis  @PROBHOSP @  LOS: 3 days    [name] 01/13/2018  . .prob

## 2018-01-13 NOTE — Discharge Instructions (Addendum)
[facility]      [facility] °[phone] ° °OPEN ABDOMINAL SURGERY: POST OP INSTRUCTIONS ° °Always review your discharge instruction sheet given to you by the facility where your surgery was performed. ° °IF YOU HAVE DISABILITY OR FAMILY LEAVE FORMS, YOU MUST BRING THEM TO THE OFFICE FOR PROCESSING.  PLEASE DO NOT GIVE THEM TO YOUR DOCTOR. ° °1. A prescription for pain medication may be given to you upon discharge.  Take your pain medication as prescribed, if needed.  If narcotic pain medicine is not needed, then you may take acetaminophen (Tylenol) or ibuprofen (Advil) as needed. °2. Take your usually prescribed medications unless otherwise directed. °3. If you need a refill on your pain medication, please contact your pharmacy. They will contact our office to request authorization.  Prescriptions will not be filled after 5pm or on week-ends. °4. You should follow a light diet the first few days after arrival home, such as soup and crackers, pudding, etc.unless your doctor has advised otherwise. A high-fiber, low fat diet can be resumed as tolerated.   Be sure to include lots of fluids daily. Most patients will experience some swelling and bruising on the chest and neck area.  Ice packs will help.  Swelling and bruising can take several days to resolve °5. Most patients will experience some swelling and bruising in the area of the incision. Ice pack will help. Swelling and bruising can take several days to resolve..  °6. It is common to experience some constipation if taking pain medication after surgery.  Increasing fluid intake and taking a stool softener will usually help or prevent this problem from occurring.  A mild laxative (Milk of Magnesia or Miralax) should be taken according to package directions if there are no bowel movements after 48 hours. °7.  You may have steri-strips (small skin tapes) in place directly over the incision.  These strips should be left on the skin for 7-10 days.  If your  surgeon used skin glue on the incision, you may shower in 24 hours.  The glue will flake off over the next 2-3 weeks.  Any sutures or staples will be removed at the office during your follow-up visit. You may find that a light gauze bandage over your incision may keep your staples from being rubbed or pulled. You may shower and replace the bandage daily. °8. ACTIVITIES:  You may resume regular (light) daily activities beginning the next day--such as daily self-care, walking, climbing stairs--gradually increasing activities as tolerated.  You may have sexual intercourse when it is comfortable.  Refrain from any heavy lifting or straining until approved by your doctor. °a. You may drive when you no longer are taking prescription pain medication, you can comfortably wear a seatbelt, and you can safely maneuver your car and apply brakes °b. Return to Work: ___________________________________ °9. You should see your doctor in the office for a follow-up appointment approximately two weeks after your surgery.  Make sure that you call for this appointment within a day or two after you arrive home to insure a convenient appointment time. °OTHER INSTRUCTIONS:  °_____________________________________________________________ °_____________________________________________________________ ° °WHEN TO CALL YOUR DOCTOR: °1. Fever over 101.0 °2. Inability to urinate °3. Nausea and/or vomiting °4. Extreme swelling or bruising °5. Continued bleeding from incision. °6. Increased pain, redness, or drainage from the incision. °7. Difficulty swallowing or breathing °8. Muscle cramping or spasms. °9. Numbness or tingling in hands or feet or around lips. ° °The clinic staff is available to   answer your questions during regular business hours.  Please dont hesitate to call and ask to speak to one of the nurses if you have concerns.  For further questions, please visit [email or web address]       Information on my medicine -  ELIQUIS (apixaban)  This medication education was reviewed with me or my healthcare representative as part of my discharge preparation.  The pharmacist that spoke with me during my hospital stay was:  Mary  Why was Eliquis prescribed for you? Eliquis was prescribed for you to reduce the risk of a blood clot forming that can cause a stroke if you have a medical condition called atrial fibrillation (a type of irregular heartbeat).  What do You need to know about Eliquis ? Take your Eliquis TWICE DAILY - one tablet in the morning and one tablet in the evening with or without food. If you have difficulty swallowing the tablet whole please discuss with your pharmacist how to take the medication safely.  Take Eliquis exactly as prescribed by your doctor and DO NOT stop taking Eliquis without talking to the doctor who prescribed the medication.  Stopping may increase your risk of developing a stroke.  Refill your prescription before you run out.  After discharge, you should have regular check-up appointments with your healthcare provider that is prescribing your Eliquis.  In the future your dose may need to be changed if your kidney function or weight changes by a significant amount or as you get older.  What do you do if you miss a dose? If you miss a dose, take it as soon as you remember on the same day and resume taking twice daily.  Do not take more than one dose of ELIQUIS at the same time to make up a missed dose.  Important Safety Information A possible side effect of Eliquis is bleeding. You should call your healthcare provider right away if you experience any of the following: ? Bleeding from an injury or your nose that does not stop. ? Unusual colored urine (red or dark brown) or unusual colored stools (red or black). ? Unusual bruising for unknown reasons. ? A serious fall or if you hit your head (even if there is no bleeding).  Some medicines may interact with Eliquis and might  increase your risk of bleeding or clotting while on Eliquis. To help avoid this, consult your healthcare provider or pharmacist prior to using any new prescription or non-prescription medications, including herbals, vitamins, non-steroidal anti-inflammatory drugs (NSAIDs) and supplements.  This website has more information on Eliquis (apixaban): http://www.eliquis.com/eliquis/home   Colostomy Home Guide, Adult A colostomy is a surgical procedure to make an opening (stoma) for stool (feces) to leave your body. This surgery is done when a medical condition prevents stool from leaving your body through the end of the large intestine (rectum). During the surgery, part of the large intestine (colon) is attached to the stoma that is made in the front of your abdomen. A bag (pouch) is fitted over the stoma. Stool and gas will collect in the bag. After having this surgery, you will need to empty and change your colostomy bag as needed. You will also need to care for the stoma. How do I care for my stoma? Your stoma should look pink, red, and moist, like the inside of your cheek. At first, the stoma may be swollen, but this swelling will go away within 6 weeks. To care for the stoma:  Keep the skin around the stoma  clean and dry.  Use a clean, soft washcloth to gently wash the stoma and the skin around it. ? Use warm water and only use cleansers recommended by your health care provider. ? Rinse the stoma area with plain water. ? Dry the area well.  Use stoma powder or ointment on your skin only as told by your health care provider. Do not use any other powders, gels, wipes, or creams on your skin.  Change your colostomy bag if your skin becomes irritated. Irritation may indicate that the bag is leaking.  Check your stoma area every day for signs of infection. Check for: ? More redness, swelling, or pain. ? More fluid or blood. ? Pus or warmth.  Measure the stoma opening regularly and record the  size. Watch for changes. Share this information with your health care provider.  How do I care for my colostomy bag? The bag that fits over the stoma can have either one or two pieces.  One-piece bag: The skin barrier and the bag are combined in a single unit.  Two-piece bag: The skin barrier and the bag are separate pieces that attach to each other.  Empty your bag at bedtime and whenever it is one-third to one-half full. Do not let more stool or gas build up. This could cause the bag to leak. Some colostomy bags have a built-in gas release valve. Change the bag every 3-4 days or as told by your health care provider. Also change the bag if it is leaking or separating from the skin or your skin looks irritated. How do I empty my colostomy bag? Before you leave the hospital, you will be taught how to empty your bag. Follow these basic steps: 1. Wash your hands with soap and water. 2. Sit far back on the toilet. 3. Put several pieces of toilet paper into the toilet water. This will prevent splashing as you empty the stool into the toilet. 4. Remove the clip or the velcro from the tail end of the bag. 5. Unroll the tail, then empty stool into the toilet. 6. Clean the tail with toilet paper. 7. Reroll the tail, and close it with the clip or velcro. 8. Wash your hands again.  How do I change my colostomy bag? Before you leave the hospital, you will be taught how to change your bag. Always have colostomy supplies with you, and follow these basic steps: 1. Wash your hands with soap and water. Have paper towels or tissues near you to clean any discharge. 2. Use a template to pre-cut the skin barrier. Smooth any rough edges. 3. If using a two-piece bag, attach the bag and the skin barrier to each other. Add the barrier ring, if you use one. 4. If your stools are watery, add a few cotton balls to the new bag to absorb the liquid. 5. Remove the old bag and skin barrier. Gently push the skin away  from the barrier with your fingers or a warm cloth. 6. Wash your hands again. Then clean the stoma area as directed with water or with mild soap and water. Use water to rinse away any soap. 7. Dry the skin. You may use the cool setting on a hair dryer to do this. 8. If directed, apply stoma powder or skin barrier gel to the skin. 9. Dry the skin again. 10. Warm the skin barrier with your hands or a warm compress. 11. Remove the paper from the sticky (adhesive) strip of the skin barrier.  12. Press the adhesive strip onto the skin around the stoma. 13. Gently rub the skin barrier onto the skin. This creates heat that helps the barrier to stick. 14. Apply stoma tape to the edges of the skin barrier.  What are some general tips?  Avoid wearing clothes that are tight directly over your stoma.  You may shower or bathe with the colostomy bag on or off. Do not use harsh or oily soaps or lotions. Dry the skin and bag after bathing.  Store all supplies in a cool, dry place. Do not leave supplies in extreme heat because parts can melt.  Whenever you leave home, take an extra skin barrier and colostomy bag with you.  If your colostomy bag gets wet, you can dry it with a hair dryer on the cool setting.  To prevent odor, put drops of ostomy deodorizer in the colostomy bag. Your health care provider may also recommend putting ostomy lubricant inside the bag. This helps the stool to slide out of the bag more easily and completely. Contact a health care provider if:  You have more redness, swelling, or pain around your stoma.  You have more fluid or blood coming from your stoma.  Your stoma feels warm to the touch.  You have pus coming from your stoma.  Your stoma extends in or out farther than normal.  You need to change the bag every day.  You have a fever. Get help right away if:  Your stool is bloody.  You vomit.  You have trouble breathing. This information is not intended to  replace advice given to you by your health care provider. Make sure you discuss any questions you have with your health care provider. Document Released: 05/27/2003 Document Revised: 10/02/2015 Document Reviewed: 09/26/2013 Elsevier Interactive Patient Education  2018 [employer].   Mechanical Wound Debridement Mechanical wound debridement is a treatment to remove dead tissue from a wound. This helps the wound heal. The treatment involves cleaning the wound (irrigation) and using a pad or gauze (dressing) to remove dead tissue and debris from the wound. There are different types of mechanical wound debridement. Depending on the wound, you may need to repeat this procedure or change to another form of debridement as your wound starts to heal. Tell a health care provider about:  Any allergies you have.  All medicines you are taking, including vitamins, herbs, eye drops, creams, and over-the-counter medicines.  Any blood disorders you have.  Any medical conditions you have, including any conditions that: ? Cause a significant decrease in blood circulation to the part of the body where the wound is, such as peripheral vascular disease. ? Compromise your defense (immune) system or white blood count.  Any surgeries you have had.  Whether you are pregnant or may be pregnant. What are the risks? Generally, this is a safe procedure. However, problems may occur, including:  Infection.  Bleeding.  Damage to healthy tissue in and around your wound.  Soreness or pain.  Failure of the wound to heal.  Scarring.  What happens before the procedure? You may be given antibiotic medicine to help prevent infection. What happens during the procedure?  Your health care provider may apply a numbing medicine (topical anesthetic) to the wound.  Your health care provider will irrigate your wound with a germ-free (sterile), salt-water (saline) solution. This removes debris, bacteria, and dead  tissue.  Depending on what type of mechanical wound debridement you are having, your health care provider may  do one of the following: ? Put a dressing on your wound. You may have dry gauze pad placed into the wound. Your health care provider will remove the gauze after the wound is dry. Any dead tissue and debris that has dried into the gauze will be lifted out of the wound (wet-to-dry debridement). ? Use a type of pad (monofilament fiber debridement pad). This pad has a fluffy surface on one side that picks up dead tissue and debris from your wound. Your health care provider wets the pad and wipes it over your wound for several minutes. ? Irrigate your wound with a pressurized stream of solution such as saline or water.  Once your health care provider is finished, he or she may apply a light dressing to your wound. The procedure may vary among health care providers and hospitals. What happens after the procedure?  You may receive medicine for pain.  You will continue to receive antibiotic medicine if it was started before your procedure. This information is not intended to replace advice given to you by your health care provider. Make sure you discuss any questions you have with your health care provider. Document Released: 02/12/2015 Document Revised: 10/30/2015 Document Reviewed: 10/02/2014 Elsevier Interactive Patient Education  [employer].

## 2018-01-13 NOTE — Progress Notes (Signed)
ANTICOAGULATION CONSULT NOTE   Pharmacy Consult for heparin Indication: atrial fibrillation  No Known Allergies  Patient Measurements: Height: 5\' 10"  (177.8 cm) Weight: 195 lb (88.5 kg) IBW/kg (Calculated) : 73 Heparin Dosing Weight: 88.5 kg  Vital Signs: Temp: 98.8 F (37.1 C) (08/09 1600) Temp Source: Oral (08/09 1600) BP: 116/78 (08/09 1900) Pulse Rate: 74 (08/09 1900)  Labs: Recent Labs    01/11/18 0435 01/12/18 0335 01/13/18 0343 01/13/18 1955  HGB  --  14.8 12.9*  --   HCT  --  44.9 39.4  --   PLT  --  277 228  --   HEPARINUNFRC  --   --   --  0.14*  CREATININE 1.11 1.21 1.15  --     Estimated Creatinine Clearance: 66 mL/min (by C-G formula based on SCr of 1.15 mg/dL).   Medical History: Past Medical History:  Diagnosis Date  . Diverticulitis     Medications:  Medications Prior to Admission  Medication Sig Dispense Refill Last Dose  . aspirin EC 81 MG tablet Take 81 mg by mouth daily.   Past Month at Unknown time  . loperamide (IMODIUM) 2 MG capsule Take 1 capsule (2 mg total) by mouth 4 (four) times daily as needed for diarrhea or loose stools. 12 capsule 0 Past Week at Unknown time  . Multiple Vitamin (MULTIVITAMIN WITH MINERALS) TABS tablet Take 1 tablet by mouth daily.   Past Month at Unknown time  . ondansetron (ZOFRAN ODT) 8 MG disintegrating tablet Take 1 tablet (8 mg total) by mouth every 8 (eight) hours as needed for nausea or vomiting. 12 tablet 0 Past Week at Unknown time  . simethicone (MYLICON) 283 MG chewable tablet Chew 125 mg by mouth every 6 (six) hours as needed for flatulence.   01/10/2018 at Unknown time    Assessment: 72 yo M POD #2 Open left colon resection, takedown splenic flexure, end colostomy left transverse colon. Pharmacy consulted to dose heparin for new onset afib.  On LMWH 40 qday for VTE px, last dose 8/8 1526.  Hg 14.8>>12.9, post op ABLA. Discussed with Dr. [name] do not bolus heparin.   ,01/13/2018 1st HL=0.14  subtherapeutic No bleeding or other issues per RN  Goal of Therapy:  Heparin level 0.3-0.7 units/ml Monitor platelets by anticoagulation protocol: Yes   Plan:  No bolus per Dr. [name] Increase heparin drip to 1600 units/hr Check heparin level with am labs Daily CBC and heparin level while on heparin drip  Dolly Rias RPh 01/13/2018, 8:20 PM Pager [phone]

## 2018-01-13 NOTE — Care Management Note (Signed)
Case Management Note  Patient Details  Name: Dave Vaughn MRN: [id number] Date of Birth: 10/17/1945  Subjective/Objective:     Pod 2 /colostomy functioning, ngtube due to post op ileus, iv cefotan/               Action/Plan:  Will follow for cm needs or hhc needs   Expected Discharge Date:  01/13/18               Expected Discharge Plan:  Home/Self Care  In-House Referral:     Discharge planning Services  CM Consult  Post Acute Care Choice:    Choice offered to:     DME Arranged:    DME Agency:     HH Arranged:    [place] Agency:     Status of Service:  In process, will continue to follow  If discussed at Long Length of Stay Meetings, dates discussed:    Additional Comments:  Leeroy Cha, RN 01/13/2018, 10:13 AM

## 2018-01-14 DIAGNOSIS — E43 Unspecified severe protein-calorie malnutrition: Secondary | ICD-10-CM

## 2018-01-14 LAB — BASIC METABOLIC PANEL
Anion gap: 9 (ref 5–15)
BUN: 14 mg/dL (ref 8–23)
CALCIUM: 8 mg/dL — AB (ref 8.9–10.3)
CO2: 29 mmol/L (ref 22–32)
CREATININE: 0.98 mg/dL (ref 0.61–1.24)
Chloride: 96 mmol/L — ABNORMAL LOW (ref 98–111)
GFR calc non Af Amer: >60 mL/min (ref >60)
Glucose, Bld: 116 mg/dL — ABNORMAL HIGH (ref 70–99)
Potassium: 2.8 mmol/L — ABNORMAL LOW (ref 3.5–5.1)
SODIUM: 134 mmol/L — AB (ref 135–145)

## 2018-01-14 LAB — CBC
HCT: 38.3 % — ABNORMAL LOW (ref 39.0–52.0)
Hemoglobin: 12.6 g/dL — ABNORMAL LOW (ref 13.0–17.0)
MCH: 29.5 pg (ref 26.0–34.0)
MCHC: 32.9 g/dL (ref 30.0–36.0)
MCV: 89.7 fL (ref 78.0–100.0)
PLATELETS: 221 10*3/uL (ref 150–400)
RBC: 4.27 MIL/uL (ref 4.22–5.81)
RDW: 12.6 % (ref 11.5–15.5)
WBC: 8.2 10*3/uL (ref 4.0–10.5)

## 2018-01-14 LAB — HEPARIN LEVEL (UNFRACTIONATED)
HEPARIN UNFRACTIONATED: 0.34 [IU]/mL (ref 0.30–0.70)
Heparin Unfractionated: 0.26 IU/mL — ABNORMAL LOW (ref 0.30–0.70)

## 2018-01-14 LAB — TSH: TSH: 1.792 u[IU]/mL (ref 0.350–4.500)

## 2018-01-14 MED ORDER — AMIODARONE HCL 200 MG PO TABS
400.0000 mg | ORAL_TABLET | Freq: Two times a day (BID) | ORAL | Status: DC
  Administered 2018-01-14 – 2018-01-17 (×7): 400 mg via ORAL
  Filled 2018-01-14 (×7): qty 2

## 2018-01-14 MED ORDER — FAMOTIDINE 20 MG PO TABS
20.0000 mg | ORAL_TABLET | Freq: Two times a day (BID) | ORAL | Status: DC
  Administered 2018-01-14 – 2018-01-17 (×6): 20 mg via ORAL
  Filled 2018-01-14 (×6): qty 1

## 2018-01-14 MED ORDER — METOPROLOL TARTRATE 25 MG PO TABS
25.0000 mg | ORAL_TABLET | Freq: Two times a day (BID) | ORAL | Status: DC
  Administered 2018-01-14 – 2018-01-15 (×3): 25 mg via ORAL
  Filled 2018-01-14 (×3): qty 1

## 2018-01-14 MED ORDER — HEPARIN (PORCINE) IN NACL 100-0.45 UNIT/ML-% IJ SOLN
1750.0000 [IU]/h | INTRAMUSCULAR | Status: DC
  Administered 2018-01-14 (×2): 1750 [IU]/h via INTRAVENOUS
  Filled 2018-01-14 (×2): qty 250

## 2018-01-14 MED ORDER — MAGNESIUM SULFATE 2 GM/50ML IV SOLN
2.0000 g | Freq: Once | INTRAVENOUS | Status: AC
  Administered 2018-01-14: 2 g via INTRAVENOUS
  Filled 2018-01-14: qty 50

## 2018-01-14 MED ORDER — POTASSIUM CHLORIDE CRYS ER 20 MEQ PO TBCR
40.0000 meq | EXTENDED_RELEASE_TABLET | Freq: Three times a day (TID) | ORAL | Status: AC
  Administered 2018-01-14 (×3): 40 meq via ORAL
  Filled 2018-01-14 (×3): qty 2

## 2018-01-14 NOTE — Plan of Care (Signed)
  Problem: Health Behavior/Discharge Planning: Goal: Ability to manage health-related needs will improve Outcome: Progressing   Problem: Clinical Measurements: Goal: Ability to maintain clinical measurements within normal limits will improve Outcome: Progressing Goal: Will remain free from infection Outcome: Progressing Goal: Diagnostic test results will improve Outcome: Progressing Goal: Respiratory complications will improve Outcome: Progressing Goal: Cardiovascular complication will be avoided Outcome: Progressing   Problem: Activity: Goal: Risk for activity intolerance will decrease Outcome: Progressing   Problem: Nutrition: Goal: Adequate nutrition will be maintained Outcome: Progressing   Problem: Coping: Goal: Level of anxiety will decrease Outcome: Progressing   Problem: Elimination: Goal: Will not experience complications related to bowel motility Outcome: Progressing Goal: Will not experience complications related to urinary retention Outcome: Progressing   Problem: Pain Managment: Goal: General experience of comfort will improve Outcome: Progressing   Problem: Safety: Goal: Ability to remain free from injury will improve Outcome: Progressing   Problem: Skin Integrity: Goal: Risk for impaired skin integrity will decrease Outcome: Progressing   Problem: Education: Goal: Knowledge of disease or condition will improve Outcome: Progressing Goal: Understanding of medication regimen will improve Outcome: Progressing   Problem: Activity: Goal: Ability to tolerate increased activity will improve Outcome: Progressing   Problem: Cardiac: Goal: Ability to achieve and maintain adequate cardiopulmonary perfusion will improve Outcome: Progressing   Problem: Health Behavior/Discharge Planning: Goal: Ability to safely manage health-related needs after discharge will improve Outcome: Progressing   

## 2018-01-14 NOTE — Progress Notes (Signed)
Patient ID: Dave Vaughn, male   DOB: [birth year], 72 y.o.   MRN: [id number] 3 Days Post-Op   Subjective: No complaints.  Tolerating clear liquids without nausea.  Colostomy functioning.  Objective: Vital signs in last 24 hours: Temp:  [98.6 F (37 C)-101.5 F (38.6 C)] 99.8 F (37.7 C) (08/10 0635) Pulse Rate:  [54-123] 120 (08/10 0600) Resp:  [14-23] 19 (08/10 0600) BP: (116-148)/(66-79) 131/78 (08/10 0000) SpO2:  [89 %-95 %] 94 % (08/10 0600) Last BM Date: 01/10/18  Intake/Output from previous day: 08/09 0701 - 08/10 0700 In: 1251.3 [P.O.:480; I.V.:721.3; IV Piggyback:50] Out: 925 [Urine:550; Stool:375] Intake/Output this shift: No intake/output data recorded.  General appearance: alert, cooperative and no distress GI: normal findings: soft, non-tender and Nondistended Incision/Wound: No erythema or drainage  Lab Results:  Recent Labs    01/13/18 0343 01/14/18 0402  WBC 8.8 8.2  HGB 12.9* 12.6*  HCT 39.4 38.3*  PLT 228 221   BMET Recent Labs    01/13/18 0343 01/14/18 0402  NA 141 134*  K 3.6 2.8*  CL 105 96*  CO2 27 29  GLUCOSE 105* 116*  BUN 23 14  CREATININE 1.15 0.98  CALCIUM 8.3* 8.0*     Studies/Results: No results found.  Anti-infectives: Anti-infectives (From admission, onward)   Start     Dose/Rate Route Frequency Ordered Stop   01/12/18 1000  cefoTEtan in dextrose 5% (CEFOTAN) IVPB 1 g     1 g Intravenous Every 12 hours 01/12/18 0612 01/17/18 0959   01/11/18 2200  cefoTEtan (CEFOTAN) 1 g in sodium chloride 0.9 % 100 mL IVPB  Status:  Discontinued     1 g 200 mL/hr over 30 Minutes Intravenous Every 12 hours 01/11/18 1810 01/12/18 0612   01/11/18 1259  cefoTEtan (CEFOTAN) 2 g in sodium chloride 0.9 % 100 mL IVPB  Status:  Discontinued     2 g 200 mL/hr over 30 Minutes Intravenous 30 min pre-op 01/11/18 1256 01/11/18 1318   01/11/18 1145  cefOXitin (MEFOXIN) 2 g in sodium chloride 0.9 % 100 mL IVPB  Status:  Discontinued     2 g 200 mL/hr  over 30 Minutes Intravenous On call to O.R. 01/11/18 1142 01/11/18 1840      Assessment/Plan: s/p Procedure(s): LEFT COLON RESECTION, TAKEDOWN SPLENIC FLEXURE, COLOSTOMY COLONOSCOPY Doing well postoperatively without complications.  Colostomy functioning.  Start full liquid diet New onset A. fib-managed by medicine.  Hypokalemia this morning.  Medicine managing.   LOS: 4 days    Edward Jolly 01/14/2018

## 2018-01-14 NOTE — Plan of Care (Signed)
Patient received as a transfer from ICU to 4 west, VSS.  Patient tolerating full liquids well.  Wife is emptying colostomy, Probation officer asked that she write down amounts she is emptying for I&O's.  Medicated for pain x 2 this shift with improvement.

## 2018-01-14 NOTE — Progress Notes (Addendum)
ANTICOAGULATION CONSULT NOTE   Pharmacy Consult for heparin Indication: atrial fibrillation  No Known Allergies  Patient Measurements: Height: 5\' 10"  (177.8 cm) Weight: 195 lb (88.5 kg) IBW/kg (Calculated) : 73 Heparin Dosing Weight: 88.5 kg  Vital Signs: Temp: 97.7 F (36.5 C) (08/10 1200) Temp Source: Oral (08/10 1200) Pulse Rate: 131 (08/10 0800)  Labs: Recent Labs    01/12/18 0335 01/13/18 0343 01/13/18 1955 01/14/18 0402 01/14/18 1249  HGB 14.8 12.9*  --  12.6*  --   HCT 44.9 39.4  --  38.3*  --   PLT 277 228  --  221  --   HEPARINUNFRC  --   --  0.14* 0.26* 0.34  CREATININE 1.21 1.15  --  0.98  --     Estimated Creatinine Clearance: 77.4 mL/min (by C-G formula based on SCr of 0.98 mg/dL).   Medical History: Past Medical History:  Diagnosis Date  . Diverticulitis     Assessment: 72 yo M POD #2 Open left colon resection, takedown splenic flexure, end colostomy left transverse colon. Pharmacy consulted to dose heparin for new onset afib.  On LMWH 40 qday for VTE px, last dose 8/8 1526.  Hg 14.8>>12.9, post op ABLA. Discussed with Dr. [name] do not bolus heparin.   Today, 8/10 0402 HL= 0.26 below goal, no infusion or bleeding issues per RN.  1249 HL = 0.34 therapeutic after rate increased to 1750 units/hr Hg 12.6 - stable post op. PLTC WNL, no bleeding reported.   Goal of Therapy:  Heparin level 0.3-0.7 units/ml Monitor platelets by anticoagulation protocol: Yes   Plan:  No bolus per Dr. [name] continue heparin drip at 1750 units/hr Daily CBC and heparin level while on heparin drip F/u transition to oral anticoagulant when able Change IV pepcid to [place], Pharm.D [phone] 01/14/2018 1:22 PM

## 2018-01-14 NOTE — Progress Notes (Signed)
PROGRESS NOTE  Dave Vaughn OBS:[id number] DOB: [birth year] DOA: 01/10/2018 PCP: [name], FNP  HPI/Recap of past 24 hours: Dave Vaughn is a 72 y.o. male with medical history significant of diverticulitis.  Patient presents to ED at [facility] with c/o abd pain, vomiting.  Was seen last week for same symptoms, thought to be a GI bug at that time; however, symptoms have persisted so returns to ED. CT shows colonic obstruction though due to mass or stricture. Suspicion for neoplasia. GI following for possible colonoscopy and gen surgery for possible colectomy and colostomy within the next 24 hours. NG tube in place.  New onset A.Fib with rate 110. On cardizem drip.  01/12/2018: POD #1 post left colon resection, takedown splenic flexure and colostomy.  Patient seen and examined with his wife at his bedside.  His pain is well controlled.  He denies nausea.  No chest pain, palpitations, or dyspnea.  01/13/2018: Seen and examined with his wife at bedside.  Pain is well controlled.  He denies chest pain, dyspnea, or palpitations.  Cardiology consulted and following.  Highly appreciated.  01/14/2018: Patient seen and examined with his wife at bedside.  He is in good spirit.  Has no new complaints.  He denies any chest pain, palpitation or dyspnea.  He is on heparin drip due to his A. fib for CVA prophylaxis.  Hemoglobin stable at 12 post surgery.   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 Fib with RVR Persistent A. fib RVR Received bolus dose of amiodarone and maintenance dose yesterday  01/13/2018 Transitioned to oral amiodarone and low-dose beta-blocker per cardiology CHADS VASC score 1 low-moderate risk-  Continue heparin drip as CVA prophylaxis  Hypokalemia Potassium 2.8 Repleted with p.o. KCl 40 mEq x 3 doses and 2 g IV magnesium Repeat BMP in the morning  Mild euvolemic hyponatremia Sodium 134 If sodium level worsens we will obtain serum osmolality, urine osmolality and urine sodium to further assess Repeat BMP in the morning  Leukocytosis, resolved Suspect reactive post surgery Repeat CBC in the morning  Mild acute blood loss anemia Hemoglobin 12.9 from 14.8 at baseline Continue to monitor H&H Repeat CBC in the morning  Dehydration Continue gentle IV fluid hydration Continue to monitor urine output  CKD 3 Creatinine back to baseline 0.98 with GFR greater than 60 Creatinine on presentation 1.1 with GFR greater than 60 Avoid nephrotoxic agents Repeat BMP in the morning    Code Status: Full code  Family Communication: Wife at bedside  Disposition Plan: Home in 1 to 2 days went to neurosurgery and cardiology sign off.    Consultants:  General surgery  GI  Cardiology  Procedures:  Left colon resection, takedown of splenic flexure and colostomy  Antimicrobials:  None  DVT prophylaxis: Subcu Lovenox daily   Objective: Vitals:   01/14/18 0600 01/14/18 0635 01/14/18 0800 01/14/18 1200  BP:      Pulse: (!) 120  (!) 131   Resp: 19  19   Temp:  99.8 F (37.7 C) 98.6 F (37 C) 97.7 F (36.5 C)  TempSrc:  Oral Oral Oral  SpO2: 94%  96%  Weight:      Height:        Intake/Output Summary (Last 24 hours) at 01/14/2018 1349 Last data filed at 01/14/2018 0300 Gross per 24 hour  Intake 936.26 ml  Output 125 ml  Net 811.26 ml   Filed Weights   01/10/18 1822  Weight: 88.5 kg    Exam:  . General: 72 y.o. year-old male well-developed well-nourished in no acute distress.  Alert oriented x3.   . Cardiovascular: Irregular rate  and rhythm with no rubs or gallops.  No JVD or thyromegaly noted.   [name] Respiratory: Clear to auscultation with no wheezes or rales.  Good inspiratory effort.   . Abdomen: Soft nontender nondistended with soft bowel sounds x4 quadrants.  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  Staples at mid abdomen from surgery with no drainage or bleeding. . Musculoskeletal: No lower extremity edema. 2/4 pulses in all 4 extremities. [name] Psychiatry: Mood is appropriate for condition and setting   Data Reviewed: CBC: Recent Labs  Lab 01/10/18 1844 01/12/18 0335 01/13/18 0343 01/14/18 0402  WBC 5.5 12.7* 8.8 8.2  NEUTROABS 2.7  --   --   --   HGB 15.5 14.8 12.9* 12.6*  HCT 45.0 44.9 39.4 38.3*  MCV 87.7 89.4 90.6 89.7  PLT 305 277 228 003   Basic Metabolic Panel: Recent Labs  Lab 01/10/18 1844 01/11/18 0435 01/12/18 0335 01/13/18 0343 01/14/18 0402  NA 135 138 142 141 134*  K 3.4* 3.8 4.0 3.6 2.8*  CL 97* 105 107 105 96*  CO2 25 25 25 27 29   GLUCOSE 134* 98 124* 105* 116*  BUN 34* 27* 27* 23 14  CREATININE 1.33* 1.11 1.21 1.15 0.98  CALCIUM 8.6* 8.5* 8.1* 8.3* 8.0*  MG 2.2  --   --  2.1  --    GFR: Estimated Creatinine Clearance: 77.4 mL/min (by C-G formula based on SCr of 0.98 mg/dL). Liver Function Tests: Recent Labs  Lab 01/10/18 1844 01/12/18 0335  AST 42* 34  ALT 85* 59*  ALKPHOS 141* 86  BILITOT 1.2 1.6*  PROT 7.7 5.3*  ALBUMIN 4.0 2.6*   Recent Labs  Lab 01/10/18 1844  LIPASE 36   No results for input(s): AMMONIA in the last 168 hours. Coagulation Profile: No results for input(s): INR, PROTIME in the last 168 hours. Cardiac Enzymes: No results for input(s): CKTOTAL, CKMB, CKMBINDEX, TROPONINI in the last 168 hours. BNP (last 3 results) No results for input(s): PROBNP in the last 8760 hours. HbA1C: No results for input(s): HGBA1C in the last 72 hours. CBG: No results for input(s): GLUCAP in the last 168 hours. Lipid Profile: No  results for input(s): CHOL, HDL, LDLCALC, TRIG, CHOLHDL, LDLDIRECT in the last 72 hours. Thyroid Function Tests: Recent Labs    01/14/18 0402  TSH 1.792   Anemia Panel: No results for input(s): VITAMINB12, FOLATE, FERRITIN, TIBC, IRON, RETICCTPCT in the last 72 hours. Urine analysis:    Component Value Date/Time   COLORURINE AMBER (A) 01/10/2018 1844   APPEARANCEUR CLOUDY (A) 01/10/2018 1844   LABSPEC >1.030 (H) 01/10/2018 1844   PHURINE 6.0 01/10/2018 1844   GLUCOSEU NEGATIVE 01/10/2018 1844   HGBUR LARGE (A) 01/10/2018 1844   BILIRUBINUR MODERATE (A) 01/10/2018 1844   KETONESUR 15 (A) 01/10/2018 1844   PROTEINUR >300 (A) 01/10/2018 1844   NITRITE NEGATIVE 01/10/2018 1844   LEUKOCYTESUR NEGATIVE 01/10/2018 1844   Sepsis Labs: @LABRCNTIP (procalcitonin:4,lacticidven:4)  ) Recent Results (from  the past 240 hour(s))  MRSA PCR Screening     Status: None   Collection Time: 01/13/18  9:11 AM  Result Value Ref Range Status   MRSA by PCR NEGATIVE NEGATIVE Final    Comment:        The GeneXpert MRSA Assay (FDA approved for NASAL specimens only), is one component of a comprehensive MRSA colonization surveillance program. It is not intended to diagnose MRSA infection nor to guide or monitor treatment for MRSA infections. Performed at [facility], [place] [address], [place], [place] [address]       Studies: No results found.  Scheduled Meds: . amiodarone  400 mg Oral BID  . cefoTEtan (CEFOTAN) IV  1 g Intravenous Q12H  . famotidine  20 mg Oral BID  . feeding supplement  1 Container Oral BID BM  . feeding supplement  237 mL Oral BID BM  . metoprolol tartrate  25 mg Oral BID  . potassium chloride  40 mEq Oral TID    Continuous Infusions: . sodium chloride Stopped (01/11/18 2112)  . heparin 1,750 Units/hr (01/14/18 0453)  . lactated ringers 75 mL/hr at 01/13/18 2344     LOS: 4 days     Kayleen Memos, MD [facility] Pager  [phone]  If 7PM-7AM, please contact night-coverage [email or web address] Password [email or web address] 01/14/2018, 1:49 PM

## 2018-01-14 NOTE — Progress Notes (Signed)
Subjective:  Doing well denies any chest pain or shortness of breath. Denies palpitations. Tolerating by mouth. Remains in A. Fib with moderate ventricular response Objective:  Vital Signs in the last 24 hours: Temp:  [98.6 F (37 C)-101.5 F (38.6 C)] 98.6 F (37 C) (08/10 0800) Pulse Rate:  [54-123] 120 (08/10 0600) Resp:  [15-23] 19 (08/10 0600) BP: (116-148)/(70-79) 131/78 (08/10 0000) SpO2:  [89 %-95 %] 94 % (08/10 0600)  Intake/Output from previous day: 08/09 0701 - 08/10 0700 In: 1251.3 [P.O.:480; I.V.:721.3; IV Piggyback:50] Out: 925 [Urine:550; Stool:375] Intake/Output from this shift: No intake/output data recorded.  Physical Exam: Neck: no adenopathy, no carotid bruit, no JVD and supple, symmetrical, trachea midline Lungs: clear to auscultation bilaterally Heart: irregularly irregular rhythm, S1, S2 normal and 2/6 systolic murmur noted Abdomen: soft distended bowel sounds present surgical wound healing colostomy bag noted Extremities: extremities normal, atraumatic, no cyanosis or edema  Lab Results: Recent Labs    01/13/18 0343 01/14/18 0402  WBC 8.8 8.2  HGB 12.9* 12.6*  PLT 228 221   Recent Labs    01/13/18 0343 01/14/18 0402  NA 141 134*  K 3.6 2.8*  CL 105 96*  CO2 27 29  GLUCOSE 105* 116*  BUN 23 14  CREATININE 1.15 0.98   No results for input(s): TROPONINI in the last 72 hours.  Invalid input(s): CK, MB Hepatic Function Panel Recent Labs    01/12/18 0335  PROT 5.3*  ALBUMIN 2.6*  AST 34  ALT 59*  ALKPHOS 86  BILITOT 1.6*   No results for input(s): CHOL in the last 72 hours. No results for input(s): PROTIME in the last 72 hours.  Imaging: Imaging results have been reviewed and No results found.  Cardiac Studies:  Assessment/Plan:  New-onset A. Fib with RVR chads vasc score of1 Status post colonic obstruction, status post open left colon resection, takedown splenic flexure and colostomy. hypokalemia Plan Replace K,  Switch  IV amiodarone to by mouth as per orders Add low-dose beta blockers as per orders Check labs in a.m.  LOS: 4 days    [name] 01/14/2018, 9:52 AM

## 2018-01-14 NOTE — Progress Notes (Signed)
[place] for heparin Indication: atrial fibrillation  No Known Allergies  Patient Measurements: Height: 5\' 10"  (177.8 cm) Weight: 195 lb (88.5 kg) IBW/kg (Calculated) : 73 Heparin Dosing Weight: 88.5 kg  Vital Signs: Temp: 99.7 F (37.6 C) (08/10 0400) Temp Source: Oral (08/10 0400) BP: 131/78 (08/10 0000) Pulse Rate: 106 (08/10 0300)  Labs: Recent Labs    01/12/18 0335 01/13/18 0343 01/13/18 1955 01/14/18 0402  HGB 14.8 12.9*  --  12.6*  HCT 44.9 39.4  --  38.3*  PLT 277 228  --  221  HEPARINUNFRC  --   --  0.14* 0.26*  CREATININE 1.21 1.15  --   --     Estimated Creatinine Clearance: 66 mL/min (by C-G formula based on SCr of 1.15 mg/dL).   Medical History: Past Medical History:  Diagnosis Date  . Diverticulitis     Medications:  Medications Prior to Admission  Medication Sig Dispense Refill Last Dose  . aspirin EC 81 MG tablet Take 81 mg by mouth daily.   Past Month at Unknown time  . loperamide (IMODIUM) 2 MG capsule Take 1 capsule (2 mg total) by mouth 4 (four) times daily as needed for diarrhea or loose stools. 12 capsule 0 Past Week at Unknown time  . Multiple Vitamin (MULTIVITAMIN WITH MINERALS) TABS tablet Take 1 tablet by mouth daily.   Past Month at Unknown time  . ondansetron (ZOFRAN ODT) 8 MG disintegrating tablet Take 1 tablet (8 mg total) by mouth every 8 (eight) hours as needed for nausea or vomiting. 12 tablet 0 Past Week at Unknown time  . simethicone (MYLICON) 245 MG chewable tablet Chew 125 mg by mouth every 6 (six) hours as needed for flatulence.   01/10/2018 at Unknown time    Assessment: 72 yo M POD #2 Open left colon resection, takedown splenic flexure, end colostomy left transverse colon. Pharmacy consulted to dose heparin for new onset afib.  On LMWH 40 qday for VTE px, last dose 8/8 1526.  Hg 14.8>>12.9, post op ABLA. Discussed with Dr. [name] do not bolus heparin.   8/9 1st HL=0.14  subtherapeutic No bleeding or other issues per RN Today, 8/10 0402 HL= 0.26 below goal, no infusion or bleeding issues per RN.   Goal of Therapy:  Heparin level 0.3-0.7 units/ml Monitor platelets by anticoagulation protocol: Yes   Plan:  No bolus per Dr. [name] Increase heparin drip to 1750 units/hr Recheck HL in 8 hours Daily CBC and heparin level while on heparin drip  Dorrene German 01/14/2018, 4:44 AM

## 2018-01-15 LAB — CBC
HCT: 37.5 % — ABNORMAL LOW (ref 39.0–52.0)
HEMOGLOBIN: 12.5 g/dL — AB (ref 13.0–17.0)
MCH: 29.6 pg (ref 26.0–34.0)
MCHC: 33.3 g/dL (ref 30.0–36.0)
MCV: 88.9 fL (ref 78.0–100.0)
Platelets: 232 10*3/uL (ref 150–400)
RBC: 4.22 MIL/uL (ref 4.22–5.81)
RDW: 12.7 % (ref 11.5–15.5)
WBC: 7.7 10*3/uL (ref 4.0–10.5)

## 2018-01-15 LAB — BASIC METABOLIC PANEL
ANION GAP: 8 (ref 5–15)
BUN: 10 mg/dL (ref 8–23)
CHLORIDE: 99 mmol/L (ref 98–111)
CO2: 30 mmol/L (ref 22–32)
Calcium: 8.1 mg/dL — ABNORMAL LOW (ref 8.9–10.3)
Creatinine, Ser: 0.97 mg/dL (ref 0.61–1.24)
Glucose, Bld: 115 mg/dL — ABNORMAL HIGH (ref 70–99)
Potassium: 4.2 mmol/L (ref 3.5–5.1)
SODIUM: 137 mmol/L (ref 135–145)

## 2018-01-15 LAB — MAGNESIUM: Magnesium: 2.2 mg/dL (ref 1.7–2.4)

## 2018-01-15 LAB — HEPARIN LEVEL (UNFRACTIONATED): Heparin Unfractionated: 0.23 IU/mL — ABNORMAL LOW (ref 0.30–0.70)

## 2018-01-15 MED ORDER — METOPROLOL TARTRATE 25 MG PO TABS
25.0000 mg | ORAL_TABLET | Freq: Three times a day (TID) | ORAL | Status: DC
  Administered 2018-01-15 (×2): 25 mg via ORAL
  Filled 2018-01-15 (×2): qty 1

## 2018-01-15 MED ORDER — APIXABAN 5 MG PO TABS
5.0000 mg | ORAL_TABLET | Freq: Two times a day (BID) | ORAL | Status: DC
  Administered 2018-01-15 – 2018-01-17 (×5): 5 mg via ORAL
  Filled 2018-01-15 (×5): qty 1

## 2018-01-15 MED ORDER — HEPARIN (PORCINE) IN NACL 100-0.45 UNIT/ML-% IJ SOLN: 1850.0000 [IU]/h | INTRAMUSCULAR | Status: DC

## 2018-01-15 NOTE — Progress Notes (Signed)
PROGRESS NOTE  Dave Vaughn TDS:[id number] DOB: March 16, 1946 DOA: 01/10/2018 PCP: [name], FNP  HPI/Recap of past 24 hours: Dave Vaughn is a 72 y.o. male with medical history significant of diverticulitis.  Patient presents to ED at [facility] with c/o abd pain, vomiting.  Was seen last week for same symptoms, thought to be a GI bug at that time; however, symptoms have persisted so returns to ED. CT shows colonic obstruction though due to mass or stricture. Suspicion for neoplasia. GI following for possible colonoscopy and gen surgery for possible colectomy and colostomy within the next 24 hours. NG tube in place.  New onset A.Fib with rate 110. On cardizem drip.  01/12/2018: POD #1 post left colon resection, takedown splenic flexure and colostomy.  Patient seen and examined with his wife at his bedside.  His pain is well controlled.  He denies nausea.  No chest pain, palpitations, or dyspnea.  01/13/2018: Seen and examined with his wife at bedside.  Pain is well controlled.  He denies chest pain, dyspnea, or palpitations.  Cardiology consulted and following.  Highly appreciated.  01/14/2018: Patient seen and examined with his wife at bedside.  He is in good spirit.  Has no new complaints.  He denies any chest pain, palpitation or dyspnea.  He is on heparin drip due to his A. fib for CVA prophylaxis.  Hemoglobin stable at 12 post surgery.  01/15/2018: Patient seen and examined at his bedside.  He has no new complaints.  He has been ambulating and his diet is been advanced by general surgery.  In A. fib but rate is better controlled.  Cardiology following.  Denies chest pain, palpitations or dyspnea.   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 fib  No longer in RVR Rate is better controlled on Lopressor Also on p.o. Amiodarone Cardiology following Chads Vasc score 1 Eliquis for CVA prophylaxis Case manager consulted to verify insurance coverage for Eliquis or Xarelto  Resolved hypokalemia Post repletion Potassium 2.8  Resolved mild euvolemic hyponatremia  Resolved leukocytosis Suspect reactive post surgery  Stable mild acute blood loss anemia Hemoglobin 12.9 from 14.8 at baseline  Dehydration Continue gentle IV fluid hydration Continue to monitor urine output  Stable CKD 3 Creatinine back to baseline 9 7 from 0.98 with GFR greater than 60 Creatinine on presentation 1.1 with GFR greater than 60 Avoid nephrotoxic agents    Code Status: Full code  Family Communication: Wife at bedside  Disposition Plan: Home possibly tomorrow 01/16/2018 when general surgery and cardiology sign off.  Consultants:  General surgery  GI  Cardiology  Procedures:  Left colon resection, takedown of splenic flexure and colostomy  Antimicrobials:  None  DVT prophylaxis: Subcu Lovenox daily   Objective: Vitals:   01/14/18 1300 01/14/18 2031 01/14/18 2134 01/15/18 0450  BP: 119/77 132/74  116/79  Pulse: 100 97  (!) 103  Resp: 20 18  18   Temp: 99.3 F (37.4 C) (!) 100.4 F (38 C) 98.6 F (37 C) 99.5 F (37.5 C)  TempSrc: Oral Oral Oral Oral  SpO2: 95% 96%  95%  Weight:  Height:        Intake/Output Summary (Last 24 hours) at 01/15/2018 1228 Last data filed at 01/15/2018 0805 Gross per 24 hour  Intake 1729.78 ml  Output 250 ml  Net 1479.78 ml   Filed Weights   01/10/18 1822  Weight: 88.5 kg    Exam:  . General: 72 y.o. year-old male well-developed well-nourished in no acute distress.  Alert and oriented x3.    . Cardiovascular: Irregular rate and rhythm with no rubs or gallops.  No JVD or thyromegaly noted. [name] Respiratory: Clear to auscultation with no wheezes or rales.  Good inspiratory effort.   . Abdomen: Soft nontender nondistended with soft bowel sounds x4 quadrants.  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  Staples at mid abdomen from surgery with no drainage or bleeding. . Musculoskeletal: No lower extremity edema. 2/4 pulses in all 4 extremities. [name] Psychiatry: Mood is appropriate for condition and setting   Data Reviewed: CBC: Recent Labs  Lab 01/10/18 1844 01/12/18 0335 01/13/18 0343 01/14/18 0402 01/15/18 0417  WBC 5.5 12.7* 8.8 8.2 7.7  NEUTROABS 2.7  --   --   --   --   HGB 15.5 14.8 12.9* 12.6* 12.5*  HCT 45.0 44.9 39.4 38.3* 37.5*  MCV 87.7 89.4 90.6 89.7 88.9  PLT 305 277 228 221 580   Basic Metabolic Panel: Recent Labs  Lab 01/10/18 1844 01/11/18 0435 01/12/18 0335 01/13/18 0343 01/14/18 0402 01/15/18 0417  NA 135 138 142 141 134* 137  K 3.4* 3.8 4.0 3.6 2.8* 4.2  CL 97* 105 107 105 96* 99  CO2 25 25 25 27 29 30   GLUCOSE 134* 98 124* 105* 116* 115*  BUN 34* 27* 27* 23 14 10   CREATININE 1.33* 1.11 1.21 1.15 0.98 0.97  CALCIUM 8.6* 8.5* 8.1* 8.3* 8.0* 8.1*  MG 2.2  --   --  2.1  --  2.2   GFR: Estimated Creatinine Clearance: 78.2 mL/min (by C-G formula based on SCr of 0.97 mg/dL). Liver Function Tests: Recent Labs  Lab 01/10/18 1844 01/12/18 0335  AST 42* 34  ALT 85* 59*  ALKPHOS 141* 86  BILITOT 1.2 1.6*  PROT 7.7 5.3*  ALBUMIN 4.0 2.6*   Recent Labs  Lab 01/10/18 1844  LIPASE 36   No results for input(s): AMMONIA in the last 168 hours. Coagulation Profile: No results for input(s): INR, PROTIME in the last 168 hours. Cardiac Enzymes: No results for input(s): CKTOTAL, CKMB, CKMBINDEX, TROPONINI in the last 168 hours. BNP (last 3 results) No results for input(s): PROBNP in the last 8760 hours. HbA1C: No  results for input(s): HGBA1C in the last 72 hours. CBG: No results for input(s): GLUCAP in the last 168 hours. Lipid Profile: No results for input(s): CHOL, HDL, LDLCALC, TRIG, CHOLHDL, LDLDIRECT in the last 72 hours. Thyroid Function Tests: Recent Labs    01/14/18 0402  TSH 1.792   Anemia Panel: No results for input(s): VITAMINB12, FOLATE, FERRITIN, TIBC, IRON, RETICCTPCT in the last 72 hours. Urine analysis:    Component Value Date/Time   COLORURINE AMBER (A) 01/10/2018 1844   APPEARANCEUR CLOUDY (A) 01/10/2018 1844   LABSPEC >1.030 (H) 01/10/2018 1844   PHURINE 6.0 01/10/2018 1844   GLUCOSEU NEGATIVE 01/10/2018 1844   HGBUR LARGE (A) 01/10/2018 1844   BILIRUBINUR MODERATE (A) 01/10/2018 1844   KETONESUR 15 (A) 01/10/2018 1844   PROTEINUR >300 (A) 01/10/2018 1844   NITRITE NEGATIVE 01/10/2018 1844  LEUKOCYTESUR NEGATIVE 01/10/2018 1844   Sepsis Labs: @LABRCNTIP (procalcitonin:4,lacticidven:4)  ) Recent Results (from the past 240 hour(s))  MRSA PCR Screening     Status: None   Collection Time: 01/13/18  9:11 AM  Result Value Ref Range Status   MRSA by PCR NEGATIVE NEGATIVE Final    Comment:        The GeneXpert MRSA Assay (FDA approved for NASAL specimens only), is one component of a comprehensive MRSA colonization surveillance program. It is not intended to diagnose MRSA infection nor to guide or monitor treatment for MRSA infections. Performed at [facility], [place] [address], [place], [place] [address]       Studies: No results found.  Scheduled Meds: . amiodarone  400 mg Oral BID  . apixaban  5 mg Oral BID  . cefoTEtan (CEFOTAN) IV  1 g Intravenous Q12H  . famotidine  20 mg Oral BID  . feeding supplement  1 Container Oral BID BM  . feeding supplement  237 mL Oral BID BM  . metoprolol tartrate  25 mg Oral TID    Continuous Infusions: . sodium chloride Stopped (01/11/18 2112)  . lactated ringers 75 mL/hr at 01/15/18 1008      LOS: 5 days     Kayleen Memos, MD [facility] Pager [phone]  If 7PM-7AM, please contact night-coverage [email or web address] Password [facility] 01/15/2018, 12:28 PM

## 2018-01-15 NOTE — Progress Notes (Signed)
Progress Note: General Surgery Service   Assessment/Plan: Patient Active Problem List   Diagnosis Date Noted  . Protein-calorie malnutrition, severe 01/14/2018  . New onset a-fib ([place]) 01/11/2018  . Colonic obstruction ([place]) 01/10/2018  . Bowel obstruction ([place]) 01/10/2018   s/p Procedure(s): LEFT COLON RESECTION, TAKEDOWN SPLENIC FLEXURE, COLOSTOMY COLONOSCOPY 01/11/2018 -advance diet -home health set up -continue to ambulate -home soon  New onset a fib -on heparin drip -f/u cardiology recs    LOS: 5 days  Chief Complaint/Subjective: Pain controlled, tolerating liquids, 1059ml output from colostomy yesterday  Objective: Vital signs in last 24 hours: Temp:  [97.7 F (36.5 C)-100.4 F (38 C)] 99.5 F (37.5 C) (08/11 0450) Pulse Rate:  [97-103] 103 (08/11 0450) Resp:  [18-20] 18 (08/11 0450) BP: (116-132)/(74-79) 116/79 (08/11 0450) SpO2:  [95 %-96 %] 95 % (08/11 0450) Last BM Date: 01/10/18  Intake/Output from previous day: 08/10 0701 - 08/11 0700 In: 1509.8 [P.O.:120; I.V.:1108.1; IV Piggyback:281.7] Out: 250 [Stool:250] Intake/Output this shift: Total I/O In: 220 [P.O.:220] Out: -   Lungs: CTAB  Cardiovascular: irreg irreg  Abd: soft, slight distended, ATTP, incision c/d/i, ostomy patent with liquid output  Extremities: no edema  Neuro: AOx4  Lab Results: CBC  Recent Labs    01/14/18 0402 01/15/18 0417  WBC 8.2 7.7  HGB 12.6* 12.5*  HCT 38.3* 37.5*  PLT 221 232   BMET Recent Labs    01/14/18 0402 01/15/18 0417  NA 134* 137  K 2.8* 4.2  CL 96* 99  CO2 29 30  GLUCOSE 116* 115*  BUN 14 10  CREATININE 0.98 0.97  CALCIUM 8.0* 8.1*   PT/INR No results for input(s): LABPROT, INR in the last 72 hours. ABG No results for input(s): PHART, HCO3 in the last 72 hours.  Invalid input(s): PCO2, PO2  Studies/Results:  Anti-infectives: Anti-infectives (From admission, onward)   Start     Dose/Rate Route Frequency Ordered Stop   01/12/18  1000  cefoTEtan in dextrose 5% (CEFOTAN) IVPB 1 g     1 g Intravenous Every 12 hours 01/12/18 0612 01/17/18 0959   01/11/18 2200  cefoTEtan (CEFOTAN) 1 g in sodium chloride 0.9 % 100 mL IVPB  Status:  Discontinued     1 g 200 mL/hr over 30 Minutes Intravenous Every 12 hours 01/11/18 1810 01/12/18 0612   01/11/18 1259  cefoTEtan (CEFOTAN) 2 g in sodium chloride 0.9 % 100 mL IVPB  Status:  Discontinued     2 g 200 mL/hr over 30 Minutes Intravenous 30 min pre-op 01/11/18 1256 01/11/18 1318   01/11/18 1145  cefOXitin (MEFOXIN) 2 g in sodium chloride 0.9 % 100 mL IVPB  Status:  Discontinued     2 g 200 mL/hr over 30 Minutes Intravenous On call to O.R. 01/11/18 1142 01/11/18 1840      Medications: Scheduled Meds: . amiodarone  400 mg Oral BID  . cefoTEtan (CEFOTAN) IV  1 g Intravenous Q12H  . famotidine  20 mg Oral BID  . feeding supplement  1 Container Oral BID BM  . feeding supplement  237 mL Oral BID BM  . metoprolol tartrate  25 mg Oral BID   Continuous Infusions: . sodium chloride Stopped (01/11/18 2112)  . heparin 1,850 Units/hr (01/15/18 0706)  . lactated ringers 75 mL/hr at 01/15/18 0600   PRN Meds:.sodium chloride, acetaminophen **OR** acetaminophen, menthol-cetylpyridinium, methocarbamol (ROBAXIN) IV, metoprolol tartrate, morphine injection, ondansetron **OR** ondansetron (ZOFRAN) IV, phenol  Mickeal Skinner, MD Pg# [phone] [facility]

## 2018-01-15 NOTE — Progress Notes (Signed)
[place] for apixaban Indication: atrial fibrillation  No Known Allergies  Patient Measurements: Height: 5\' 10"  (177.8 cm) Weight: 195 lb (88.5 kg) IBW/kg (Calculated) : 73 Heparin Dosing Weight: 88.5 kg  Vital Signs: Temp: 99.5 F (37.5 C) (08/11 0450) Temp Source: Oral (08/11 0450) BP: 116/79 (08/11 0450) Pulse Rate: 103 (08/11 0450)  Labs: Recent Labs    01/13/18 0343  01/14/18 0402 01/14/18 1249 01/15/18 0417 01/15/18 0616  HGB 12.9*  --  12.6*  --  12.5*  --   HCT 39.4  --  38.3*  --  37.5*  --   PLT 228  --  221  --  232  --   HEPARINUNFRC  --    < > 0.26* 0.34  --  0.23*  CREATININE 1.15  --  0.98  --  0.97  --    < > = values in this interval not displayed.    Estimated Creatinine Clearance: 78.2 mL/min (by C-G formula based on SCr of 0.97 mg/dL).   Medical History: Past Medical History:  Diagnosis Date  . Diverticulitis     Assessment: 72 yo M POD #2 Open left colon resection, takedown splenic flexure, end colostomy left transverse colon. Pharmacy consulted to dose heparin for new onset afib.  On LMWH 40 qday for VTE px, last dose 8/8 1526.  Hg 14.8>>12.9, post op ABLA. Discussed with Dr. [name] do not bolus heparin.   01/15/2018  Hg 12.5 - stable, PLTC WNL, no bleeding reported.  Transition from heparin to apixaban   Goal of Therapy:  Monitor platelets by anticoagulation protocol: Yes   Plan:  Apixaban 5 mg PO BID  Monitor for signs and symptoms of bleeding Pharmacy to educated and provide manufacturer coupon card   Royetta Asal, PharmD, BCPS Pager [phone] 01/15/2018 10:38 AM

## 2018-01-15 NOTE — Progress Notes (Signed)
Subjective:  Patient denies any chest pain or shortness of breath. Denies abdominal pain. Denies palpitations. Remains in A. Fib with moderate ventricular response  Objective:  Vital Signs in the last 24 hours: Temp:  [97.7 F (36.5 C)-100.4 F (38 C)] 99.5 F (37.5 C) (08/11 0450) Pulse Rate:  [97-103] 103 (08/11 0450) Resp:  [18-20] 18 (08/11 0450) BP: (116-132)/(74-79) 116/79 (08/11 0450) SpO2:  [95 %-96 %] 95 % (08/11 0450)  Intake/Output from previous day: 08/10 0701 - 08/11 0700 In: 1509.8 [P.O.:120; I.V.:1108.1; IV Piggyback:281.7] Out: 250 [Stool:250] Intake/Output from this shift: Total I/O In: 220 [P.O.:220] Out: -   Physical Exam: Neck: no adenopathy, no carotid bruit, no JVD and supple, symmetrical, trachea midline Lungs: clear to auscultation bilaterally Heart: irregularly irregular rhythm, S1, S2 normal and 2/6 systolic murmur noted Abdomen: soft, non-tender; bowel sounds normal; no masses,  no organomegaly Extremities: extremities normal, atraumatic, no cyanosis or edema  Lab Results: Recent Labs    01/14/18 0402 01/15/18 0417  WBC 8.2 7.7  HGB 12.6* 12.5*  PLT 221 232   Recent Labs    01/14/18 0402 01/15/18 0417  NA 134* 137  K 2.8* 4.2  CL 96* 99  CO2 29 30  GLUCOSE 116* 115*  BUN 14 10  CREATININE 0.98 0.97   No results for input(s): TROPONINI in the last 72 hours.  Invalid input(s): CK, MB Hepatic Function Panel No results for input(s): PROT, ALBUMIN, AST, ALT, ALKPHOS, BILITOT, BILIDIR, IBILI in the last 72 hours. No results for input(s): CHOL in the last 72 hours. No results for input(s): PROTIME in the last 72 hours.  Imaging: Imaging results have been reviewed and No results found.  Cardiac Studies:  Assessment/Plan:   Persistent A. Fib with moderate ventricular response chads vasc score of1 Status post colonic obstruction, status post open left colon resection, takedown splenic flexure and colostomy. Plan Increase Lopressor  as per orders Okay to switch IV heparin to Eliquis.   LOS: 5 days    [name] 01/15/2018, 10:07 AM

## 2018-01-15 NOTE — Progress Notes (Signed)
[place] for apixaban Indication: atrial fibrillation  No Known Allergies  Patient Measurements: Height: 5\' 10"  (177.8 cm) Weight: 195 lb (88.5 kg) IBW/kg (Calculated) : 73 Heparin Dosing Weight: 88.5 kg  Vital Signs: Temp: 99 F (37.2 C) (08/11 1229) Temp Source: Oral (08/11 1229) BP: 123/86 (08/11 1229) Pulse Rate: 70 (08/11 1229)  Labs: Recent Labs    01/13/18 0343  01/14/18 0402 01/14/18 1249 01/15/18 0417 01/15/18 0616  HGB 12.9*  --  12.6*  --  12.5*  --   HCT 39.4  --  38.3*  --  37.5*  --   PLT 228  --  221  --  232  --   HEPARINUNFRC  --    < > 0.26* 0.34  --  0.23*  CREATININE 1.15  --  0.98  --  0.97  --    < > = values in this interval not displayed.    Estimated Creatinine Clearance: 78.2 mL/min (by C-G formula based on SCr of 0.97 mg/dL).   Medical History: Past Medical History:  Diagnosis Date  . Diverticulitis     Assessment: 72 yo M POD #2 Open left colon resection, takedown splenic flexure, end colostomy left transverse colon. Pharmacy consulted to dose heparin for new onset afib.  On LMWH 40 qday for VTE px, last dose 8/8 1526.  Hg 14.8>>12.9, post op ABLA. Discussed with Dr. [name] do not bolus heparin.   01/15/2018  Hg 12.5 - stable, PLTC WNL, no bleeding reported.  Transition from heparin to apixaban   Goal of Therapy:  Monitor platelets by anticoagulation protocol: Yes   Plan:  Apixaban 5 mg PO BID  Monitor for signs and symptoms of bleeding Pharmacy to educated and provide manufacturer coupon card  Royetta Asal, PharmD, BCPS Pager [phone] 01/15/2018 6:19 PM   Addendum:  Pharmacy provided education and manufacturer coupon card.   Lenis Noon, PharmD 01/15/18  6:20 PM

## 2018-01-15 NOTE — Plan of Care (Signed)
  Problem: Health Behavior/Discharge Planning: Goal: Ability to manage health-related needs will improve Outcome: Progressing   Problem: Clinical Measurements: Goal: Ability to maintain clinical measurements within normal limits will improve Outcome: Progressing Goal: Will remain free from infection Outcome: Progressing Goal: Diagnostic test results will improve Outcome: Progressing Goal: Respiratory complications will improve Outcome: Progressing Goal: Cardiovascular complication will be avoided Outcome: Progressing   Problem: Activity: Goal: Risk for activity intolerance will decrease Outcome: Progressing   Problem: Nutrition: Goal: Adequate nutrition will be maintained Outcome: Progressing   Problem: Coping: Goal: Level of anxiety will decrease Outcome: Progressing   Problem: Elimination: Goal: Will not experience complications related to bowel motility Outcome: Progressing Goal: Will not experience complications related to urinary retention Outcome: Progressing   Problem: Pain Managment: Goal: General experience of comfort will improve Outcome: Progressing   Problem: Safety: Goal: Ability to remain free from injury will improve Outcome: Progressing   Problem: Skin Integrity: Goal: Risk for impaired skin integrity will decrease Outcome: Progressing   Problem: Education: Goal: Knowledge of disease or condition will improve Outcome: Progressing Goal: Understanding of medication regimen will improve Outcome: Progressing   Problem: Activity: Goal: Ability to tolerate increased activity will improve Outcome: Progressing   Problem: Cardiac: Goal: Ability to achieve and maintain adequate cardiopulmonary perfusion will improve Outcome: Progressing   Problem: Health Behavior/Discharge Planning: Goal: Ability to safely manage health-related needs after discharge will improve Outcome: Progressing   

## 2018-01-15 NOTE — Progress Notes (Signed)
ANTICOAGULATION CONSULT NOTE   Pharmacy Consult for heparin Indication: atrial fibrillation  No Known Allergies  Patient Measurements: Height: 5\' 10"  (177.8 cm) Weight: 195 lb (88.5 kg) IBW/kg (Calculated) : 73 Heparin Dosing Weight: 88.5 kg  Vital Signs: Temp: 99.5 F (37.5 C) (08/11 0450) Temp Source: Oral (08/11 0450) BP: 116/79 (08/11 0450) Pulse Rate: 103 (08/11 0450)  Labs: Recent Labs    01/13/18 0343  01/14/18 0402 01/14/18 1249 01/15/18 0417 01/15/18 0616  HGB 12.9*  --  12.6*  --  12.5*  --   HCT 39.4  --  38.3*  --  37.5*  --   PLT 228  --  221  --  232  --   HEPARINUNFRC  --    < > 0.26* 0.34  --  0.23*  CREATININE 1.15  --  0.98  --  0.97  --    < > = values in this interval not displayed.    Estimated Creatinine Clearance: 78.2 mL/min (by C-G formula based on SCr of 0.97 mg/dL).   Medical History: Past Medical History:  Diagnosis Date  . Diverticulitis     Assessment: 72 yo M POD #2 Open left colon resection, takedown splenic flexure, end colostomy left transverse colon. Pharmacy consulted to dose heparin for new onset afib.  On LMWH 40 qday for VTE px, last dose 8/8 1526.  Hg 14.8>>12.9, post op ABLA. Discussed with Dr. [name] do not bolus heparin.   01/15/2018  Heparin level a little below goal at 0.23 on heparin 1750 units/hr Hg 12.5 - stable, PLTC WNL, no bleeding reported.    Goal of Therapy:  Heparin level 0.3-0.7 units/ml Monitor platelets by anticoagulation protocol: Yes   Plan:  No bolus per Dr. [name] on 8/9 increase heparin drip to 1850 units/hr and check 8 hr HL Daily CBC and heparin level while on heparin drip F/u transition to oral anticoagulant when able  Eudelia Bunch, Pharm.D [phone] 01/15/2018 6:55 AM

## 2018-01-15 NOTE — Plan of Care (Signed)
Patient tolerating heart healthy diet, colostomy functioning without issue.  Patient up to chair and ambulatory around room.  Medicated for pain several times this shift with improvement.  Wicks removed from incision as per order (POD4).  Wife and multiple family members at bedside.

## 2018-01-16 ENCOUNTER — Telehealth: Payer: Self-pay | Admitting: Oncology

## 2018-01-16 ENCOUNTER — Encounter: Payer: Self-pay | Admitting: Oncology

## 2018-01-16 DIAGNOSIS — C186 Malignant neoplasm of descending colon: Secondary | ICD-10-CM

## 2018-01-16 DIAGNOSIS — I4891 Unspecified atrial fibrillation: Secondary | ICD-10-CM

## 2018-01-16 LAB — CBC
HCT: 36.6 % — ABNORMAL LOW (ref 39.0–52.0)
Hemoglobin: 12.3 g/dL — ABNORMAL LOW (ref 13.0–17.0)
MCH: 29.9 pg (ref 26.0–34.0)
MCHC: 33.6 g/dL (ref 30.0–36.0)
MCV: 88.8 fL (ref 78.0–100.0)
PLATELETS: 254 10*3/uL (ref 150–400)
RBC: 4.12 MIL/uL — AB (ref 4.22–5.81)
RDW: 12.9 % (ref 11.5–15.5)
WBC: 7.6 10*3/uL (ref 4.0–10.5)

## 2018-01-16 MED ORDER — ACETAMINOPHEN 500 MG PO TABS
1000.0000 mg | ORAL_TABLET | Freq: Four times a day (QID) | ORAL | Status: DC
  Administered 2018-01-16 – 2018-01-17 (×5): 1000 mg via ORAL
  Filled 2018-01-16 (×5): qty 2

## 2018-01-16 MED ORDER — TRAMADOL HCL 50 MG PO TABS: 50.0000 mg | ORAL_TABLET | Freq: Four times a day (QID) | ORAL | Status: DC | PRN

## 2018-01-16 MED ORDER — TRAMADOL HCL 50 MG PO TABS: 50.0000 mg | ORAL_TABLET | Freq: Two times a day (BID) | ORAL | Status: DC

## 2018-01-16 MED ORDER — DILTIAZEM HCL 60 MG PO TABS
60.0000 mg | ORAL_TABLET | Freq: Three times a day (TID) | ORAL | Status: DC
  Administered 2018-01-16 – 2018-01-17 (×5): 60 mg via ORAL
  Filled 2018-01-16 (×5): qty 1

## 2018-01-16 NOTE — Consult Note (Signed)
[place] Nurse ostomy follow up Surgical team following for assessment and plan of care to abd wound.  Stoma type/location: RUQ end colostomy Stomal assessment/size: 1 5/8" x 2" oval Peristomal assessment: red, moist, flush with skin level.  Treatment options for stomal/peristomal skin: barrier ring Output: mod amt liquid brown stool Ostomy pouching: 2pc. 2 3/4" Education provided: Wife was able to apply barrier ring and pouch with minimal assistance. Wife and patient are able to open and close pouch. Reviewed pouching routines and ordering supplies. Extra supplies and educational materials are in the room.  Enrolled patient in [facility] program: Yes, earlier  [facility] MSN, [place], [employer], [place], [place]

## 2018-01-16 NOTE — Progress Notes (Signed)
Physical Therapy Treatment Patient Details Name: Dave Vaughn MRN: [id number] DOB: 02-17-1946 Today's Date: 01/16/2018    History of Present Illness Pt is a 72 year old male s/p Open left colon resection, takedown splenic flexure, end colostomy left transverse colon and new onset afib    PT Comments    Pt mobilizing well and reports ambulating around unit this weekend.  Pt is currently supervision level and spouse present and will be assisting at home upon d/c.  Pt continues to have elevated HR during ambulation however asymptomatic.  Pt agreeable for d/c from acute PT and states he will ambulate with staff or family during remainder of acute stay.  PT to sign off.   Follow Up Recommendations  No PT follow up     Equipment Recommendations  None recommended by PT    Recommendations for Other Services       Precautions / Restrictions Precautions Precaution Comments: monitor HR, R UQ colostomy    Mobility  Bed Mobility Overal bed mobility: Needs Assistance Bed Mobility: Rolling;Sidelying to Sit Rolling: Min guard Sidelying to sit: Min guard       General bed mobility comments: spouse assisted pt OOB and into bathroom with PT waiting at doorway per pt request  Transfers Overall transfer level: Needs assistance Equipment used: None Transfers: Sit to/from Stand Sit to Stand: Supervision            Ambulation/Gait Ambulation/Gait assistance: Supervision Gait Distance (Feet): 200 Feet Assistive device: None Gait Pattern/deviations: Step-through pattern;Decreased stride length     General Gait Details: pt had IV pole initially however able to ambulate without any UE support, HR briefly up to 145 bpm however mostly remained around 135 bpm during ambulation; pt asymptomatic   Stairs             Wheelchair Mobility    Modified Rankin (Stroke Patients Only)       Balance                                            Cognition  Arousal/Alertness: Awake/alert Behavior During Therapy: WFL for tasks assessed/performed Overall Cognitive Status: Within Functional Limits for tasks assessed                                        Exercises      General Comments        Pertinent Vitals/Pain Pain Assessment: No/denies pain    Home Living                      Prior Function            PT Goals (current goals can now be found in the care plan section) Progress towards PT goals: Goals met/education completed, patient discharged from PT    Frequency    Min 3X/week      PT Plan Current plan remains appropriate    Co-evaluation              AM-PAC PT "6 Clicks" Daily Activity  Outcome Measure  Difficulty turning over in bed (including adjusting bedclothes, sheets and blankets)?: A Little Difficulty moving from lying on back to sitting on the side of the bed? : A Little Difficulty sitting down on and standing up from  a chair with arms (e.g., wheelchair, bedside commode, etc,.)?: A Little Help needed moving to and from a bed to chair (including a wheelchair)?: A Little Help needed walking in hospital room?: A Little Help needed climbing 3-5 steps with a railing? : A Little 6 Click Score: 18    End of Session   Activity Tolerance: Patient tolerated treatment well Patient left: in bed;with call bell/phone within reach;with family/visitor present Nurse Communication: Mobility status PT Visit Diagnosis: Difficulty in walking, not elsewhere classified (R26.2)     Time: 1100-1120 PT Time Calculation (min) (ACUTE ONLY): 20 min  Charges:  $Gait Training: 8-22 mins                    Carmelia Bake, PT, DPT 01/16/2018 Pager: [phone]  York Ram E 01/16/2018, 3:35 PM

## 2018-01-16 NOTE — Progress Notes (Signed)
5 Days Post-Op    CC: Complaints of nausea, vomiting, and diarrhea  Subjective: Seems to be doing well this a.m.  Tolerating the diet well.  No chest pain or discomfort.  He is doing well with his incentive spirometry.  Midline incision has had a wick was removed and staples are still in place.  The wound is still somewhat soupy, especially at the base.  Colostomy is working well.  Objective: Vital signs in last 24 hours: Temp:  [98.6 F (37 C)-99.5 F (37.5 C)] 98.6 F (37 C) (08/12 0522) Pulse Rate:  [70-90] 90 (08/12 0522) Resp:  [16-20] 18 (08/12 0522) BP: (104-123)/(77-86) 104/77 (08/12 0522) SpO2:  [94 %-98 %] 95 % (08/12 0522) Last BM Date: 01/10/18 670 p.o. Recorded 1691 IV Urine x1 recorded Colostomy 875 recorded Afebrile vital signs are stable.  Heart rates in the 70-90 range yesterday afternoon according to the vital signs.  The telemetry shows rates between 130s-140s. CBC is stable   Intake/Output from previous day: 08/11 0701 - 08/12 0700 In: 2361.9 [P.O.:670; I.V.:1691.9] Out: 875 [Stool:875] Intake/Output this shift: No intake/output data recorded.  General appearance: alert, cooperative and no distress Resp: clear to auscultation bilaterally Cardio: He is still in atrial fibrillation and telemetry shows his rates between 100 - 150.  Mostly in the 1 20-1 30 range on telemetry.[name] GI: Abdomen soft, he is sore, ostomy is working well.  His midline incision has had the wicks removed.  There are staples in place the wound remains slightly soupy with some drainage on the dressings.  Lab Results:  Recent Labs    01/15/18 0417 01/16/18 0423  WBC 7.7 7.6  HGB 12.5* 12.3*  HCT 37.5* 36.6*  PLT 232 254    BMET Recent Labs    01/14/18 0402 01/15/18 0417  NA 134* 137  K 2.8* 4.2  CL 96* 99  CO2 29 30  GLUCOSE 116* 115*  BUN 14 10  CREATININE 0.98 0.97  CALCIUM 8.0* 8.1*   PT/INR No results for input(s): LABPROT, INR in the last 72 hours.  Recent  Labs  Lab 01/10/18 1844 01/12/18 0335  AST 42* 34  ALT 85* 59*  ALKPHOS 141* 86  BILITOT 1.2 1.6*  PROT 7.7 5.3*  ALBUMIN 4.0 2.6*     Lipase     Component Value Date/Time   LIPASE 36 01/10/2018 1844     Prior to Admission medications   Medication Sig Start Date End Date Taking? Authorizing Provider  aspirin EC 81 MG tablet Take 81 mg by mouth daily.   Yes [provider]  loperamide (IMODIUM) 2 MG capsule Take 1 capsule (2 mg total) by mouth 4 (four) times daily as needed for diarrhea or loose stools. 12/29/17  Yes Dorie Rank, MD  Multiple Vitamin (MULTIVITAMIN WITH MINERALS) TABS tablet Take 1 tablet by mouth daily.   Yes [provider]  ondansetron (ZOFRAN ODT) 8 MG disintegrating tablet Take 1 tablet (8 mg total) by mouth every 8 (eight) hours as needed for nausea or vomiting. 12/29/17  Yes Dorie Rank, MD  simethicone (MYLICON) 557 MG chewable tablet Chew 125 mg by mouth every 6 (six) hours as needed for flatulence.   Yes [provider]    Medications: . amiodarone  400 mg Oral BID  . apixaban  5 mg Oral BID  . cefoTEtan (CEFOTAN) IV  1 g Intravenous Q12H  . diltiazem  60 mg Oral Q8H  . famotidine  20 mg Oral BID  .  feeding supplement  1 Container Oral BID BM  . feeding supplement  237 mL Oral BID BM   . sodium chloride Stopped (01/11/18 2112)  . lactated ringers 75 mL/hr at 01/16/18 0600   Anti-infectives (From admission, onward)   Start     Dose/Rate Route Frequency Ordered Stop   01/12/18 1000  cefoTEtan in dextrose 5% (CEFOTAN) IVPB 1 g     1 g Intravenous Every 12 hours 01/12/18 0612 01/17/18 0959   01/11/18 2200  cefoTEtan (CEFOTAN) 1 g in sodium chloride 0.9 % 100 mL IVPB  Status:  Discontinued     1 g 200 mL/hr over 30 Minutes Intravenous Every 12 hours 01/11/18 1810 01/12/18 0612   01/11/18 1259  cefoTEtan (CEFOTAN) 2 g in sodium chloride 0.9 % 100 mL IVPB  Status:  Discontinued     2 g 200 mL/hr over 30 Minutes Intravenous 30  min pre-op 01/11/18 1256 01/11/18 1318   01/11/18 1145  cefOXitin (MEFOXIN) 2 g in sodium chloride 0.9 % 100 mL IVPB  Status:  Discontinued     2 g 200 mL/hr over 30 Minutes Intravenous On call to O.R. 01/11/18 1142 01/11/18 1840      Assessment/Plan  Obstructing descending colon mass Exploratory laparotomy, left colectomy, takedown of splenic flexure, end colostomy, 01/11/2018, Dr. [name]  New onset atrial fibrillation with RVR-Cardizem drip transition to cardizem/amiodarone - oral  HR 120's -150's  FEN: IV fluids/heart healthy diet ID: Cefotetan 8/7-8/13/19 DVT: IV heparin converted to Eliquis Follow-up: Dr. [name], Dr. [name]   Plan: Place him on some scheduled Tylenol and work on getting him off the morphine.  He had morphine 6 times yesterday and 3 times so far today.  I will also increase the Ultram.  He is to complete the Cefotetan today.  I instructed him and his family how to just gently clean the incision with soap and water 3 times a day.  He was converted from Lopressor to p.o. Cardizem today.  He was started on apixaban yesterday.  Once he is stable from a cardiac standpoint will discuss discharge.   LOS: 6 days    Rocio Wolak 01/16/2018 [phone]

## 2018-01-16 NOTE — Care Management (Signed)
01-16-18 BENEFITS CHECK :  # 1. S/W   JIM    @ [facility]  #  [phone]   PATIENT NEED TO UPDATE  ADDRESS AND ZIP CODE WITH  [employer]    1. ELIQUIS  2.5 MG BID  COVER- YES CO-PAY- $ 50.00 TIER- NO PRIOR APPROVAL- NO  2. ELIQUIS  5 MG BID COVER- YES CO-PAY- $ 50.00 TIER- NO PRIOR APPROVAL- NO  3. XARELTO  15 MG BID COVER- YES CO-PAY $ 50.00 TIER- NO  PRIOR APPROVAL- NO  4. XARELTO  20 MG DAILY COVER- YES CO-PAY- $ 50.00 TIER- NO PRIOR APPROVAL- NO  NO DEDUCTIBLE PREFERRED PHARMACY : YES  [facility]  AND [facility]

## 2018-01-16 NOTE — Plan of Care (Signed)
  Problem: Clinical Measurements: Goal: Diagnostic test results will improve Outcome: Progressing Goal: Respiratory complications will improve Outcome: Progressing Goal: Cardiovascular complication will be avoided Outcome: Progressing   Problem: Activity: Goal: Risk for activity intolerance will decrease Outcome: Progressing   Problem: Nutrition: Goal: Adequate nutrition will be maintained Outcome: Progressing   Problem: Coping: Goal: Level of anxiety will decrease Outcome: Progressing   Problem: Elimination: Goal: Will not experience complications related to bowel motility Outcome: Progressing Goal: Will not experience complications related to urinary retention Outcome: Progressing   Problem: Safety: Goal: Ability to remain free from injury will improve Outcome: Progressing

## 2018-01-16 NOTE — Progress Notes (Signed)
PROGRESS NOTE  Ioannis Schuh NTI:[id number] DOB: [birth year] DOA: 01/10/2018 PCP: [name], FNP  HPI/Recap of past 24 hours: Taahir Grisby is a 72 y.o. male with medical history significant of diverticulitis.  Patient presents to ED at [facility] with c/o abd pain, vomiting.  Was seen last week for same symptoms, thought to be a GI bug at that time; however, symptoms have persisted so returns to ED. CT shows colonic obstruction due to mass.   01/11/2018 POD #0 post left colon resection, takedown splenic flexure and colostomy.  Hospital course complicated by new onset A. fib with RVR.  Cardiology consulted and following.  01/16/2018: Patient seen and examined with his wife at bedside.  No acute events overnight.  He is tolerating a diet well.  Denies chest pain, palpitations or dyspnea.  Has been ambulating without any difficulty.  Started on p.o. diltiazem by cardiology today.  He is also on p.o. amiodarone.   Assessment/Plan: Principal Problem:   Colonic obstruction (HCC) Active Problems:   Bowel obstruction (HCC)   New onset a-fib (HCC)   Protein-calorie malnutrition, severe   Cancer of left colon (HCC)  Malignant colon obstruction status post left colon resection, takedown splenic flexure, and colostomy POD #5 GI followed and signed off General surgery following Pathology revealed colorectal adenocarcinoma Called and spoke with Dr. Burr Medico from medical oncology on 01/16/2018 who will arrange for follow-up appointment outpatient  Pathology report: INVASIVE COLORECTAL ADENOCARCINOMA, 4 CM. - TUMOR EXTENDS INTO PERICOLONIC CONNECTIVE TISSUE. - TUMOR FOCALLY INVOLVES RADIAL MARGIN. - ONE MESENTERIC TUMOR DEPOSIT. - THIRTEEN BENIGN LYMPH NODES (0/13)  Colorectal adenocarcinoma Management as stated above  New-onset permanent A. fib  Cardiology is following Lopressor switched to Cardizem p.o. Continue amiodarone Chads Vasc score 1 Continue Eliquis for CVA prophylaxis Discharge  possibly tomorrow 01/17/2018 on Eliquis  Resolved hypokalemia Post repletion  Resolved mild euvolemic hyponatremia  Resolved leukocytosis Suspect reactive post surgery  Stable mild acute blood loss anemia Hemoglobin 12.9 from 14.8 at baseline  Resolved dehydration Stopped IV fluid  Stable CKD 3 Creatinine back to baseline 0.97 with GFR greater than 60 Continue to monitor urine output Continue to avoid nephrotoxic agents    Code Status: Full code  Family Communication: Wife at bedside  Disposition Plan: Home possibly tomorrow 01/17/2018 when cardiology signs off.  Consultants:  General surgery  GI  Cardiology  Procedures:  Left colon resection, takedown of splenic flexure and colostomy  Antimicrobials: None  DVT prophylaxis: Eliquis   Objective: Vitals:   01/15/18 2043 01/16/18 0522 01/16/18 1008 01/16/18 1354  BP: 118/80 104/77 114/77 101/73  Pulse: 88 90 (!) 112 99  Resp: 16 18  20   Temp: 99.5 F (37.5 C) 98.6 F (37 C)  98.3 F (36.8 C)  TempSrc: Oral Oral  Oral  SpO2: 94% 95%  96%  Weight:      Height:        Intake/Output Summary (Last 24 hours) at 01/16/2018 1453 Last data filed at 01/16/2018 1400 Gross per 24 hour  Intake 1762.24 ml  Output 925 ml  Net 837.24 ml   Filed Weights   01/10/18 1822  Weight: 88.5 kg    Exam:  . General: 72 y.o. year-old male well-developed well-nourished in no acute distress.  Alert and oriented x3. . Cardiovascular: Irregular rate and rhythm with no rubs or gallops.  No JVD or thyromegaly noted. [name] Respiratory: Clear to auscultation with no wheezes or rales.  Good inspiratory effort.   . Abdomen: Soft  nontender nondistended with soft bowel sounds x4 quadrants.  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  Staples at mid abdomen from surgery with no drainage or bleeding. . Musculoskeletal: No lower extremity edema. 2/4 pulses in all 4 extremities. [name] Psychiatry: Mood is  appropriate for condition and setting   Data Reviewed: CBC: Recent Labs  Lab 01/10/18 1844 01/12/18 0335 01/13/18 0343 01/14/18 0402 01/15/18 0417 01/16/18 0423  WBC 5.5 12.7* 8.8 8.2 7.7 7.6  NEUTROABS 2.7  --   --   --   --   --   HGB 15.5 14.8 12.9* 12.6* 12.5* 12.3*  HCT 45.0 44.9 39.4 38.3* 37.5* 36.6*  MCV 87.7 89.4 90.6 89.7 88.9 88.8  PLT 305 277 228 221 232 371   Basic Metabolic Panel: Recent Labs  Lab 01/10/18 1844 01/11/18 0435 01/12/18 0335 01/13/18 0343 01/14/18 0402 01/15/18 0417  NA 135 138 142 141 134* 137  K 3.4* 3.8 4.0 3.6 2.8* 4.2  CL 97* 105 107 105 96* 99  CO2 25 25 25 27 29 30   GLUCOSE 134* 98 124* 105* 116* 115*  BUN 34* 27* 27* 23 14 10   CREATININE 1.33* 1.11 1.21 1.15 0.98 0.97  CALCIUM 8.6* 8.5* 8.1* 8.3* 8.0* 8.1*  MG 2.2  --   --  2.1  --  2.2   GFR: Estimated Creatinine Clearance: 78.2 mL/min (by C-G formula based on SCr of 0.97 mg/dL). Liver Function Tests: Recent Labs  Lab 01/10/18 1844 01/12/18 0335  AST 42* 34  ALT 85* 59*  ALKPHOS 141* 86  BILITOT 1.2 1.6*  PROT 7.7 5.3*  ALBUMIN 4.0 2.6*   Recent Labs  Lab 01/10/18 1844  LIPASE 36   No results for input(s): AMMONIA in the last 168 hours. Coagulation Profile: No results for input(s): INR, PROTIME in the last 168 hours. Cardiac Enzymes: No results for input(s): CKTOTAL, CKMB, CKMBINDEX, TROPONINI in the last 168 hours. BNP (last 3 results) No results for input(s): PROBNP in the last 8760 hours. HbA1C: No results for input(s): HGBA1C in the last 72 hours. CBG: No results for input(s): GLUCAP in the last 168 hours. Lipid Profile: No results for input(s): CHOL, HDL, LDLCALC, TRIG, CHOLHDL, LDLDIRECT in the last 72 hours. Thyroid Function Tests: Recent Labs    01/14/18 0402  TSH 1.792   Anemia Panel: No results for input(s): VITAMINB12, FOLATE, FERRITIN, TIBC, IRON, RETICCTPCT in the last 72 hours. Urine analysis:    Component Value Date/Time   COLORURINE  AMBER (A) 01/10/2018 1844   APPEARANCEUR CLOUDY (A) 01/10/2018 1844   LABSPEC >1.030 (H) 01/10/2018 1844   PHURINE 6.0 01/10/2018 1844   GLUCOSEU NEGATIVE 01/10/2018 1844   HGBUR LARGE (A) 01/10/2018 1844   BILIRUBINUR MODERATE (A) 01/10/2018 1844   KETONESUR 15 (A) 01/10/2018 1844   PROTEINUR >300 (A) 01/10/2018 1844   NITRITE NEGATIVE 01/10/2018 1844   LEUKOCYTESUR NEGATIVE 01/10/2018 1844   Sepsis Labs: @LABRCNTIP (procalcitonin:4,lacticidven:4)  ) Recent Results (from the past 240 hour(s))  MRSA PCR Screening     Status: None   Collection Time: 01/13/18  9:11 AM  Result Value Ref Range Status   MRSA by PCR NEGATIVE NEGATIVE Final    Comment:        The GeneXpert MRSA Assay (FDA approved for NASAL specimens only), is one component of a comprehensive MRSA colonization surveillance program. It is not intended to diagnose MRSA infection nor to guide or monitor treatment for MRSA infections. Performed at  [facility], [place] [address] [place], [place] [address]       Studies: No results found.  Scheduled Meds: . acetaminophen  1,000 mg Oral Q6H  . amiodarone  400 mg Oral BID  . apixaban  5 mg Oral BID  . cefoTEtan (CEFOTAN) IV  1 g Intravenous Q12H  . diltiazem  60 mg Oral Q8H  . famotidine  20 mg Oral BID  . feeding supplement  1 Container Oral BID BM  . feeding supplement  237 mL Oral BID BM    Continuous Infusions: . sodium chloride Stopped (01/11/18 2112)     LOS: 6 days     Kayleen Memos, MD [facility] Pager [phone]  If 7PM-7AM, please contact night-coverage [email or web address] Password [facility] 01/16/2018, 2:53 PM

## 2018-01-16 NOTE — Progress Notes (Signed)
Subjective:  Patient denies any complaints. Remains in A. fib with moderate to rapid ventricular response asymptomatic  Objective:  Vital Signs in the last 24 hours: Temp:  [98.6 F (37 C)-99.5 F (37.5 C)] 98.6 F (37 C) (08/12 0522) Pulse Rate:  [70-90] 90 (08/12 0522) Resp:  [16-20] 18 (08/12 0522) BP: (104-123)/(77-86) 104/77 (08/12 0522) SpO2:  [94 %-98 %] 95 % (08/12 0522)  Intake/Output from previous day: 08/11 0701 - 08/12 0700 In: 2361.9 [P.O.:670; I.V.:1691.9] Out: 875 [Stool:875] Intake/Output from this shift: No intake/output data recorded.  Physical Exam: Neck: no adenopathy, no carotid bruit, no JVD and supple, symmetrical, trachea midline Lungs: clear to auscultation bilaterally Heart: irregularly irregular rhythm, S1, S2 normal and 2/6 systolic murmur noted  Abdomen: soft, non-tender; bowel sounds normal; no masses,  no organomegaly and Surgical dressing dry  colostomy bag noted Extremities: extremities normal, atraumatic, no cyanosis or edema  Lab Results: Recent Labs    01/15/18 0417 01/16/18 0423  WBC 7.7 7.6  HGB 12.5* 12.3*  PLT 232 254   Recent Labs    01/14/18 0402 01/15/18 0417  NA 134* 137  K 2.8* 4.2  CL 96* 99  CO2 29 30  GLUCOSE 116* 115*  BUN 14 10  CREATININE 0.98 0.97   No results for input(s): TROPONINI in the last 72 hours.  Invalid input(s): CK, MB Hepatic Function Panel No results for input(s): PROT, ALBUMIN, AST, ALT, ALKPHOS, BILITOT, BILIDIR, IBILI in the last 72 hours. No results for input(s): CHOL in the last 72 hours. No results for input(s): PROTIME in the last 72 hours.  Imaging: Imaging  results have been reviewed and No results found.  Cardiac Studies:  Assessment/Plan:  Persistent A. Fib with moderate ventricular response chads vasc score of1 Status post colonic obstruction, status post open left colon resection, takedown splenic flexure and colostomy. Plan Change Lopressor to Cardizem as per  orders Continue amiodarone  LOS: 6 days    [name] 01/16/2018, 8:09 AM

## 2018-01-16 NOTE — Progress Notes (Addendum)
Checking pt's co pay. Pt's insurance co states zip code is not correct. After speaking with the pt concerning his zip code. Pt just moved from [place], [place] zip [phone]. Will continue to follow up on medication co-pay.

## 2018-01-16 NOTE — Progress Notes (Signed)
Met with Dave Vaughn and his wife. Explained role of nurse navigator. Educational information provided on colorectal cancer and Port-A-Cath.Teach back method was used.  No barriers to care identified at present time.  Will continue to follow as needed. Contact information provided for questions. Hospital F/U scheduled for 01/30/18 @ 9 AM.

## 2018-01-16 NOTE — Telephone Encounter (Signed)
Received a staff message from Dr. Burr Medico to schedule the pt with her or Dr. Benay Spice in [place]. Pt has been scheduled to see Dr. Benay Spice on 8/296 at 2pm. Pt is currently in the hospital. Letter mailed.

## 2018-01-16 NOTE — Progress Notes (Signed)
[name]  [phone]   HEMATOLOGY ONCOLOGY INPATIENT CONSULTATION   Dave Vaughn  DOB: 07/14/1945  MR#: [id number]  CSN#: [id number]    Requesting Physician: [facility]  Patient Care Team: [name], FNP as PCP - General (Family Medicine)  Reason for consult: Newly Diagnosed Colon Cancer  History of present illness:  01/16/18  Dave Vaughn 72 y.o. male was admitted to the hospital on 01/10/18 for abdominal pain and vomiting. His CT showed colonic obstruction. He underwent Colonoscopy on 01/11/18 by Dr. [name] which showed Diverticulitis and malignant tumor in colon. He underwent surgery on 01/11/18 for removal by Dr. [name].  The patient presents in the hospital today accompanied by his wife.  He notes intermittent abdominal pain started 3-4 weeks ago that got progressively worse. 2 weeks ago he developed nausea and diarrhea which he thought was a stomach virus. Symptoms persisted and he developed abdominal bloating last week when her presented to the ED.   Today the patient notes he feels well post surgery. He is getting use to colostomy bag. He notes Dr. [name] plans to get colostomy reversal soon. He changes his bag every 2-3 hours. He is able to eat by mouth with heart healthy diet. He had Afib noted during this hospital stay before he underwent surgery. He denies SOB or heart palpitations or chest pain. At most he took 2 baby aspirin a day and a multivitamin daily.   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 He is very active with exercise and travels often.   He does not have a significant PMHx. His last colonoscopy was 12-13 years ago when he had benign polyps and diverticulitis. He denies issues with this or GI bleeding. He had ankle surgery in college from sports. 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 He is a non smoker and drinks about 2 bears a week, overall socially.    His mother passed from metastatic breast cancer. She was diagnosed in her 27s. His daughter had skin melanoma and ALL. She is currently cured.     REVIEW OF SYSTEMS:    Constitutional: Denies fevers, chills or abnormal night sweats (+) increase by mouth eating Eyes: Denies blurriness of vision, double vision or watery eyes Ears, nose, mouth, throat, and face: Denies mucositis or sore throat Respiratory: Denies cough, dyspnea or wheezes Cardiovascular: Denies palpitation, chest discomfort or lower extremity swelling Gastrointestinal:  Denies nausea, heartburn or change in bowel habits (+) Colostomy bag with loose stool  Skin: Denies abnormal skin rashes Lymphatics: Denies new lymphadenopathy or easy bruising Neurological:Denies numbness, tingling or new weaknesses Behavioral/Psych: Mood is stable, no new changes  All other systems were reviewed with the patient and are negative.   MEDICAL HISTORY:  Past Medical History:  Diagnosis Date  . Diverticulitis     SURGICAL HISTORY: Past Surgical History:  Procedure Laterality Date  . ANKLE SURGERY     when he was in college  . COLON RESECTION N/A 01/11/2018   Procedure: LEFT COLON RESECTION, TAKEDOWN SPLENIC FLEXURE, COLOSTOMY;  Surgeon: [name], MD;  Location: [facility];  Service: General;  Laterality: N/A;  . COLONOSCOPY  01/11/2018   Procedure: COLONOSCOPY;  Surgeon: Jackquline Denmark, MD;  Location: [facility];  Service: Endoscopy;;  . thumb surgery       SOCIAL HISTORY: Social History   Socioeconomic History  . Marital status: Married    Spouse name: Not  on file  . Number of children: Not on file  . Years of education: Not on file  . Highest education level: Not on file  Occupational History  . Not on file  Social Needs  . Financial resource strain: Not on file  . Food insecurity:    Worry: Not on file    Inability: Not on file  . Transportation needs:    Medical: Not on file    Non-medical: Not on file  Tobacco Use  .  Smoking status: Never Smoker  . Smokeless tobacco: Never Used  Substance and Sexual Activity  . Alcohol use: Yes    Comment: drinks socially about 2x/week  . Drug use: Not on file  . Sexual activity: Not on file  Lifestyle  . Physical activity:    Days per week: Not on file    Minutes per session: Not on file  . Stress: Not on file  Relationships  . Social connections:    Talks on phone: Not on file    Gets together: Not on file    Attends religious service: Not on file    Active member of club or organization: Not on file    Attends meetings of clubs or organizations: Not on file    Relationship status: Not on file  . Intimate partner violence:    Fear of current or ex partner: Not on file    Emotionally abused: Not on file    Physically abused: Not on file    Forced sexual activity: Not on file  Other Topics Concern  . Not on file  Social History Narrative  . Not on file    FAMILY HISTORY: History reviewed. No pertinent family history.  ALLERGIES:  has No Known Allergies.  MEDICATIONS:  Current Facility-Administered Medications  Medication Dose Route Frequency Provider Last Rate Last Dose  . 0.9 %  sodium chloride infusion   Intravenous PRN Etta Quill, DO   Stopped at 01/11/18 2112  . acetaminophen (TYLENOL) tablet 1,000 mg  1,000 mg Oral Q6H Earnstine Regal, PA-C   1,000 mg at 01/16/18 1158  . amiodarone (PACERONE) tablet 400 mg  400 mg Oral BID [name], MD   400 mg at 01/16/18 0957  . apixaban (ELIQUIS) tablet 5 mg  5 mg Oral BID Royetta Asal, RPH   5 mg at 01/16/18 0956  . cefoTEtan in dextrose 5% (CEFOTAN) IVPB 1 g  1 g Intravenous Q12H Hall, Carole N, DO   1 g at 01/16/18 0958  . diltiazem (CARDIZEM) tablet 60 mg  60 mg Oral Q8H [name], MD   60 mg at 01/16/18 1647  . famotidine (PEPCID) tablet 20 mg  20 mg Oral BID Eudelia Bunch, RPH   20 mg at 01/16/18 0956  . feeding supplement (BOOST / RESOURCE BREEZE) liquid 1 Container  1  Container Oral BID BM Irene Pap N, DO   1 Container at 01/12/18 1527  . feeding supplement (ENSURE SURGERY) liquid 237 mL  237 mL Oral BID BM Hall, Carole N, DO   237 mL at 01/12/18 1527  . menthol-cetylpyridinium (CEPACOL) lozenge 3 mg  1 lozenge Oral PRN Meuth, Brooke A, PA-C      . methocarbamol (ROBAXIN) 500 mg in dextrose 5 % 50 mL IVPB  500 mg Intravenous Q8H PRN Meuth, Brooke A, PA-C   Stopped at 01/12/18 1617  . metoprolol tartrate (LOPRESSOR) injection 2.5-5 mg  2.5-5 mg Intravenous Q6H PRN Meuth, Brooke A, PA-C      .  morphine 2 MG/ML injection 1-4 mg  1-4 mg Intravenous Q1H PRN [name], MD   2 mg at 01/16/18 0733  . ondansetron (ZOFRAN) tablet 4 mg  4 mg Oral Q6H PRN Meuth, Brooke A, PA-C       Or  . ondansetron (ZOFRAN) injection 4 mg  4 mg Intravenous Q6H PRN Meuth, Brooke A, PA-C   4 mg at 01/11/18 1348  . phenol (CHLORASEPTIC) mouth spray 1 spray  1 spray Mouth/Throat PRN Meuth, Brooke A, PA-C      . traMADol (ULTRAM) tablet 50 mg  50 mg Oral Q6H PRN Earnstine Regal, PA-C        PHYSICAL EXAMINATION: ECOG PERFORMANCE STATUS: 3 - Symptomatic, >50% confined to bed  Vitals:   01/16/18 1008 01/16/18 1354  BP: 114/77 101/73  Pulse: (!) 112 99  Resp:  20  Temp:  98.3 F (36.8 C)  SpO2:  96%   Filed Weights   01/10/18 1822  Weight: 195 lb (88.5 kg)    GENERAL:alert, no distress and comfortable SKIN: skin color, texture, turgor are normal, no rashes or significant lesions EYES: normal, conjunctiva are pink and non-injected, sclera clear OROPHARYNX:no exudate, no erythema and lips, buccal mucosa, and tongue normal  NECK: supple, thyroid normal size, non-tender, without nodularity LYMPH:  no palpable lymphadenopathy in the cervical, axillary or inguinal LUNGS: clear to auscultation and percussion with normal breathing effort HEART: regular rate & rhythm and no murmurs and no lower extremity edema ABDOMEN:abdomen soft, non-tender and normal bowel sounds (+)  midline surgical incision healing well with Colostomy bag placed Musculoskeletal:no cyanosis of digits and no clubbing  PSYCH: alert & oriented x 3 with fluent speech NEURO: no focal motor/sensory deficits  LABORATORY DATA:  I have reviewed the data as listed Lab Results  Component Value Date   WBC 7.6 01/16/2018   HGB 12.3 (L) 01/16/2018   HCT 36.6 (L) 01/16/2018   MCV 88.8 01/16/2018   PLT 254 01/16/2018   Recent Labs    12/29/17 1915 01/10/18 1844  01/12/18 0335 01/13/18 0343 01/14/18 0402 01/15/18 0417  NA 140 135   < > 142 141 134* 137  K 4.2 3.4*   < > 4.0 3.6 2.8* 4.2  CL 101 97*   < > 107 105 96* 99  CO2 26 25   < > 25 27 29 30   GLUCOSE 135* 134*   < > 124* 105* 116* 115*  BUN 45* 34*   < > 27* 23 14 10   CREATININE 1.46* 1.33*   < > 1.21 1.15 0.98 0.97  CALCIUM 9.5 8.6*   < > 8.1* 8.3* 8.0* 8.1*  GFRNONAA 47* 52*   < > 58* >60 >60 >60  GFRAA 54* >60   < > >60 >60 >60 >60  PROT 8.1 7.7  --  5.3*  --   --   --   ALBUMIN 4.3 4.0  --  2.6*  --   --   --   AST 41 42*  --  34  --   --   --   ALT 35 85*  --  59*  --   --   --   ALKPHOS 83 141*  --  86  --   --   --   BILITOT 1.3* 1.2  --  1.6*  --   --   --    < > = values in this interval not displayed.    Baseline CEA on 01/11/18 at  3.4    PATHOLOGY Diagnosis 01/11/18  1. Colon, segmental resection for tumor, descending colon - INVASIVE COLORECTAL ADENOCARCINOMA, 4 CM. - TUMOR EXTENDS INTO PERICOLONIC CONNECTIVE TISSUE. - TUMOR FOCALLY INVOLVES RADIAL MARGIN. - ONE MESENTERIC TUMOR DEPOSIT. - THIRTEEN BENIGN LYMPH NODES (0/13). 2. Colon, segmental resection, splenic flexure - BENIGN COLON. - NO EVIDENCE OF MALIGNANCY . Microscopic Comment 1. COLON AND RECTUM: Resection, Including Transanal Disk Excision of Rectal Neoplasms Procedure: Left colon partial resection Tumor Site: Descending colon. Tumor Size: 4 cm Macroscopic Tumor Perforation: No Histologic Type: Colorectal adenocarcinoma Histologic Grade:  Moderately differentiated Tumor Extension: Into pericolonic connective tissue Margins: Focally involves radial margin Treatment Effect: No Lymphovascular Invasion: No Perineural Invasion: No Tumor Deposits: One Regional Lymph Nodes: No lymph nodes submitted or found: 13 Number of Lymph Nodes Involved: 0, one tumor deposit Number of Lymph Nodes Examined: 13 Pathologic Stage Classification (pTNM, AJCC 8th Edition): pT3, pN1c Ancillary Studies: MMR / MSI testing: pending) Representative tumor block: 1C, 1D, 1E and 37F 1 of 4 Supplemental copy SUPPLEMENTAL for Rucinski, STEVE ([id number]) Microscopic Comment(continued) Comments: The carcinoma extends into the pericolonic connective tissue where there is focal involvement of the radial margin. There is also diverticulosis present. (JDP:kh 01-13-18)    PROCEDURES  ECHO: 01/12/18  Study Conclusions - Left ventricle: The cavity size was normal. Systolic function was   normal. The estimated ejection fraction was in the range of 55%   to 60%. Wall motion was normal; there were no regional wall   motion abnormalities. - Mitral valve: There was moderate regurgitation. - Left atrium: The atrium was mildly to moderately dilated. - Right ventricle: The cavity size was mildly dilated.    Colonoscopy 01/11/18 by Dr. [name]  - Malignant completely obstructing tumor in the mid descending colon. Tattooed. - Diverticulosis in the sigmoid colon. - Non-bleeding internal hemorrhoids. - No specimens collected.   RADIOGRAPHIC STUDIES: I have personally reviewed the radiological images as listed and agreed with the findings in the report. Ct Abdomen Pelvis W Contrast  Result Date: 01/10/2018 CLINICAL DATA:  Abdominal distention with nausea, vomiting and diarrhea 4 days. History of diverticulitis. EXAM: CT ABDOMEN AND PELVIS WITH CONTRAST TECHNIQUE: Multidetector CT imaging of the abdomen and pelvis was performed using the standard protocol following bolus  administration of intravenous contrast. CONTRAST:  138m ISOVUE-300 IOPAMIDOL (ISOVUE-300) INJECTION 61% COMPARISON:  02/26/2014 FINDINGS: Lower chest: No acute abnormality. Hepatobiliary: Subcentimeter hypodensity over the right lobe of the liver too small to characterize but likely a cyst. Biliary tree is within normal. Possible minimal cholelithiasis. Pancreas: Normal. Spleen: Normal. Adrenals/Urinary Tract: Adrenal glands are normal. Kidneys normal size without hydronephrosis or nephrolithiasis. There is a stable 1.6 cm hypodensity over the lower pole right kidney unchanged and likely a slightly hyperdense cyst. Ureters are normal. Mild increased density over the posterior dependent portion of the bladder which is in continuity with the adjacent mildly prominent prostate gland likely representing prostatic impression upon the bladder base although it would be difficult to exclude a intrinsic bladder mass. Stomach/Bowel: Stomach is normal. There multiple fluid and air-filled dilated small bowel loops to the level of the ileocecal valve measuring up to 4.8 cm in diameter. There is mild fluid and air-filled prominence of the colon to the level of the mid descending colon with there is an abrupt caliber change and associated irregular soft tissue density suggesting neoplastic stricture. This is likely the site of obstruction. Moderate diverticulosis of the distal descending and sigmoid colon. Appendix demonstrates mild mucosal  enhancement and measures 1.2 cm in thickness at the tip. There is no adjacent inflammatory change or free fluid. Vascular/Lymphatic: Minimal atherosclerotic plaque over the abdominal aorta and iliac arteries. No adenopathy. Reproductive: Mild prostatic enlargement. Other: No significant free fluid or free peritoneal air. Musculoskeletal: Degenerative change of the spine and hips. Multilevel disc disease throughout the lumbar spine. IMPRESSION: Irregular soft tissue density causing stricture of  the mid descending colon likely the site of obstruction for the dilated small bowel. 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 No evidence of perforation. Equivocal findings involving the appendix measuring 1.2 cm at the appendiceal tip with mucosal enhancement. No adjacent free fluid or inflammatory change. Findings are nonspecific, but can be seen with early acute appendicitis. Mild prostatic enlargement. Increased density over the posterior bladder base likely due to the large prostatic impression although cannot completely exclude a bladder mass. Urology protocol CT or ultrasound may be helpful for better evaluation. Mild cholelithiasis. Stable 1.5 cm cystic structure over the lower pole right kidney likely slightly hyperdense cyst. Diverticulosis of the colon. Aortic Atherosclerosis (ICD10-I70.0). These results were called by telephone at the time of interpretation on 01/10/2018 at 8:25 pm to Dr. Shirlyn Goltz , who verbally acknowledged these results. Electronically Signed   By: Marin Olp M.D.   On: 01/10/2018 20:25    ASSESSMENT & PLAN:  Tregan Read is a 72 y.o. Caucasian male with a history of diverticulitis who presented to the ED for abdominal pain and was found to have cancer of the descending colon.     1. Cancer of left colon, Stage IIIB (pT3N1cMx), MSI pending  -I reviewed his image findings and surgical pathology report with patient and wife in great detail. He presented with chronic bowel obstruction secondary to tumor, underwent left hemicolectomy and was found to have Stage IIIB cancer of the descending colon.  -I discussed the risk of cancer recurrence after surgery.  Given his locally advanced disease with mesenteric positive margins he has a higher risk of recurrence.  -I recommend CT chest wo contrast to complete a staging work-up and rule out distant metastasis in the chest.  This can be done in house or as outpatient. -I discussed standard of care for his stage IIIB disease is adjuvant  chemo to reduce his risk of recurrence by half. I recommend FOLFOX every 2 weeks for 6 months or CAPOX every 3 weeks for 3 months. Given his good baseline health, he should be able to tolerate either regimen.   --Chemotherapy consent: Side effects including but does not limited to, fatigue, nausea, vomiting, diarrhea, hair loss, hand foot syndrome, cold sensitivity, neuropathy, fluid retention, renal and kidney dysfunction, neutropenic fever, needed for blood transfusion, bleeding, were discussed with patient in great detail. He is interested in treatment but will like to think about his options.  -the goal of therapy is curative  -He is recovering well from surgery, eating by mouth more and bowel movement is improving. Plan to start chemo 4 weeks post surgery to allow time for better recovery. I encouraged him to gradually increase eating, walking and light activities.  -I will discuss his case in GI Tumor board to see if adjuvant radiation is indicated due to the positive radical margin.  -He will have chemo education class and PAC placement before starting treatment.  -I briefly discussed the surveillance plan, which is a physical exam and lab test (including CBC, CMP and CEA) every 3-4 months for the first 2 years, then every 6-12 months, colonoscopy  in one year, and surveillance CT scan every 6-12 month for up to 5 year.  -F/u as outpatient in a few weeks   2. New Onset Afib -Found before surgery  -He was put on prn metoprolol and Eliquis in hospital  -01/12/18 ECHO shows EF of 55-60% with moderate mitral valve regurgitation    Recommendations:  -We discussed adjuvant chemo, he is interested, plan to start in about 4 weeks after surgery. -CT chest wo contrast for staging, OK to do as outpt and I will arrange  -I plan to see him back in my office in 2 weeks -will discuss in GI tumor board    All questions were answered. The patient knows to call the clinic with any problems, questions or  concerns.      [name], MD 01/16/2018 5:05 PM  I, Joslyn Devon, am acting as scribe for [name], MD.   I have reviewed the above documentation for accuracy and completeness, and I agree with the above.

## 2018-01-17 MED ORDER — DILTIAZEM HCL ER COATED BEADS 240 MG PO CP24: 240.0000 mg | ORAL_CAPSULE | Freq: Every day | ORAL | 11 refills | Status: DC

## 2018-01-17 MED ORDER — AMIODARONE HCL 200 MG PO TABS: 200.0000 mg | ORAL_TABLET | Freq: Two times a day (BID) | ORAL | 0 refills | Status: DC

## 2018-01-17 MED ORDER — APIXABAN 5 MG PO TABS: 5.0000 mg | ORAL_TABLET | Freq: Two times a day (BID) | ORAL | 0 refills | Status: DC

## 2018-01-17 MED ORDER — TRAMADOL HCL 50 MG PO TABS: 50.0000 mg | ORAL_TABLET | Freq: Four times a day (QID) | ORAL | 0 refills | Status: DC | PRN

## 2018-01-17 NOTE — Progress Notes (Signed)
6 Days Post-Op    CC:  Complaints of nausea, vomiting, and diarrhea  Subjective: He is doing well, tolerating a regular diet.  Still draining fluid from sites between staples, so we are taking out most of the staples and will open it up more.  After her gets some pain medicine.  He has been taking only Tylenol for pain.    Objective: Vital signs in last 24 hours: Temp:  [97.9 F (36.6 C)-99.3 F (37.4 C)] 97.9 F (36.6 C) (08/13 0438) Pulse Rate:  [91-112] 91 (08/13 0438) Resp:  [18-20] 18 (08/13 0438) BP: (101-114)/(73-77) 106/75 (08/13 0438) SpO2:  [94 %-96 %] 94 % (08/13 0438) Last BM Date: 01/10/18 470 PO 400 IV Stool 1700 recorded  No urine recorded Afebrile, VSS- remains in AF HR mostly 120-130's, with PVC's and some paired PVC's. CBC is stable    Intake/Output from previous day: 08/12 0701 - 08/13 0700 In: 874.3 [P.O.:470; I.V.:404.3] Out: 1700 [Stool:1700] Intake/Output this shift: No intake/output data recorded.  General appearance: alert, cooperative and no distress Resp: clear to auscultation bilaterally GI: still distended some, ostomy is working well, mostly liquid.  Opening wound  some for ongoing drainage on the dressing.  Lab Results:  Recent Labs    01/15/18 0417 01/16/18 0423  WBC 7.7 7.6  HGB 12.5* 12.3*  HCT 37.5* 36.6*  PLT 232 254    BMET Recent Labs    01/15/18 0417  NA 137  K 4.2  CL 99  CO2 30  GLUCOSE 115*  BUN 10  CREATININE 0.97  CALCIUM 8.1*   PT/INR No results for input(s): LABPROT, INR in the last 72 hours.  Recent Labs  Lab 01/10/18 1844 01/12/18 0335  AST 42* 34  ALT 85* 59*  ALKPHOS 141* 86  BILITOT 1.2 1.6*  PROT 7.7 5.3*  ALBUMIN 4.0 2.6*     Lipase     Component Value Date/Time   LIPASE 36 01/10/2018 1844     Medications: . acetaminophen  1,000 mg Oral Q6H  . amiodarone  400 mg Oral BID  . apixaban  5 mg Oral BID  . diltiazem  60 mg Oral Q8H  . famotidine  20 mg Oral BID  . feeding  supplement  1 Container Oral BID BM  . feeding supplement  237 mL Oral BID BM    Assessment/Plan Obstructing descending colon mass Cancer of left colon, Stage IIIB (pT3N1cMx), MSI pending  Exploratory laparotomy, left colectomy, takedown of splenic flexure, end colostomy, 01/11/2018, Dr. [name] POD# 6  New onset atrial fibrillation with RVR-Cardizem drip transition to cardizem/amiodarone - oral  HR 120's -150's  FEN: IV fluids/heart healthy diet ID: Cefotetan 8/7-8/13/19 DVT: IV heparin converted to Eliquis Follow-up: Dr. [name], Dr. [name], Dr. [name]  Plan:  From our standpoint he can go home will defer to Cardiology    LOS: 7 days    Dave Vaughn 01/17/2018 [phone]

## 2018-01-17 NOTE — Care Management Note (Signed)
Case Management Note  Patient Details  Name: Dave Vaughn MRN: [id number] Date of Birth: 1946-05-31  Subjective/Objective:                    Action/Plan: Pt discharged home with [place].   Expected Discharge Date:  01/13/18               Expected Discharge Plan:  Home/Self Care  In-House Referral:     Discharge planning Services  CM Consult, Other - See comment(medication coverage check)  Post Acute Care Choice:    Choice offered to:     DME Arranged:    DME Agency:  [place]:  RN [facility] Agency:     Status of Service:  Completed, signed off  If discussed at [place] of Stay Meetings, dates discussed:    Additional CommentsPurcell Mouton, RN 01/17/2018, 10:53 AM

## 2018-01-17 NOTE — Discharge Summary (Signed)
Physician Discharge Summary  Chance Munter DTO:[id number] DOB: July 13, 1945 DOA: 01/10/2018  PCP: [name], FNP  Admit date: 01/10/2018 Discharge date: 01/17/2018  Admitted From: Home Disposition: Home  Recommendations for Outpatient Follow-up:  1. Follow up with PCP in 1-2 weeks 2. Please obtain BMP/CBC in one week   Home Health: Home health RN for dressing changes on the abdominal surgical site Equipment/Devices none Discharge Condition stable CODE STATUS full code Diet recommendation: Cardiac Brief/Interim Summary:72 y.o.malewith medical history significant ofdiverticulitis. Patient presents to ED at [facility] with c/o abd pain, vomiting. Was seen last week for same symptoms, thought to be a GI bug at that time; however, symptoms have persisted so returns to ED.CT showscolonic obstruction due to mass.   01/11/2018 POD #0 post left colon resection, takedown splenic flexure and colostomy.  Hospital course complicated by new onset A. fib with RVR.  Cardiology consulted and following.  01/16/2018: Patient seen and examined with his wife at bedside.  No acute events overnight.  He is tolerating a diet well.  Denies chest pain, palpitations or dyspnea.  Has been ambulating without any difficulty.  Started on p.o. diltiazem by cardiology today.  He is also on p.o. amiodarone  Discharge Diagnoses:  Principal Problem:   Colonic obstruction ([place]) Active Problems:   Bowel obstruction ([place])   New onset a-fib ([facility])   Protein-calorie malnutrition, severe   Cancer of left colon ([place])  Malignant colon obstruction status post left colon resection, takedown splenic flexure, and colostomy POD #6.  Patient follow-up with general surgery as an outpatient.  Patient also to follow-up with Dr. Burr Medico oncology.  Pathology revealed colorectal adenocarcinoma.  Pathology report: INVASIVE COLORECTAL ADENOCARCINOMA, 4 CM. - TUMOR EXTENDS INTO PERICOLONIC CONNECTIVE TISSUE. - TUMOR FOCALLY INVOLVES  RADIAL MARGIN. - ONE MESENTERIC TUMOR DEPOSIT. - THIRTEEN BENIGN LYMPH NODES (0/13)    permanent A. fib -seen by cardiology during this hospital stay.  He will be discharged on Cardizem to 40 mg daily, amiodarone 200 mg twice a day Eliquis.  Resolved mild euvolemic hyponatremia  Resolved leukocytosis Suspect reactive post surgery  Stable mild acute blood loss anemia Hemoglobin 12.9 from 14.8 at baseline  Resolved dehydration Stopped IV fluid  Stable CKD 3 Creatinine back to baseline 0.97 with GFR greater than 60 Continue to monitor urine output Continue to avoid nephrotoxic agents Discharge Instructions  Discharge Instructions    Call MD for:  difficulty breathing, headache or visual disturbances   Complete by:  As directed    Call MD for:  persistant dizziness or light-headedness   Complete by:  As directed    Call MD for:  persistant nausea and vomiting   Complete by:  As directed    Call MD for:  redness, tenderness, or signs of infection (pain, swelling, redness, odor or green/yellow discharge around incision site)   Complete by:  As directed    Call MD for:  severe uncontrolled pain   Complete by:  As directed    Diet - low sodium heart healthy   Complete by:  As directed    Diet - low sodium heart healthy   Complete by:  As directed    Increase activity slowly   Complete by:  As directed    Increase activity slowly   Complete by:  As directed      Allergies as of 01/17/2018   No Known Allergies     Medication List    STOP taking these medications   loperamide 2 MG capsule  Commonly known as:  IMODIUM   ondansetron 8 MG disintegrating tablet Commonly known as:  ZOFRAN-ODT   simethicone 125 MG chewable tablet Commonly known as:  MYLICON     TAKE these medications   amiodarone 200 MG tablet Commonly known as:  PACERONE Take 1 tablet (200 mg total) by mouth 2 (two) times daily.   apixaban 5 MG Tabs tablet Commonly known as:  ELIQUIS Take 1  tablet (5 mg total) by mouth 2 (two) times daily.   aspirin EC 81 MG tablet Take 81 mg by mouth daily.   diltiazem 240 MG 24 hr capsule Commonly known as:  CARDIZEM CD Take 1 capsule (240 mg total) by mouth daily.   multivitamin with minerals Tabs tablet Take 1 tablet by mouth daily.   traMADol 50 MG tablet Commonly known as:  ULTRAM Take 1 tablet (50 mg total) by mouth every 6 (six) hours as needed for moderate pain or severe pain (pain not relieved by Tylenol).      Follow-up Information    [name], MD. Go on 01/26/2018.   Specialty:  General Surgery Why:  Your appointment is 01/26/18 at 11:20AM with Dr. [name]. Please arrive 30 minutes prior to your appointment to check in and fill out paperwork. Bring photo ID and insurance information. Contact information: [address] [place] [address] [phone]        [name], FNP Follow up.   Specialty:  Family Medicine Contact information: [place] [address] [place] Follow up.   Why:  Prien will call you to schedule a time to visit with 24 to 48 hours after discharge. Please call [facility] if you do not receive a call.   Contact information: [address] [place] [address] [phone]        [name], MD Follow up.   Specialty:  Cardiology Contact information: [place] [address] [phone]        [name], MD Follow up.   Specialties:  Hematology, Oncology Contact information: [place] [address] [phone]          No Known Allergies  Consultations:  General surgery, GI   Procedures/Studies: Ct Abdomen Pelvis W Contrast  Result Date: 01/10/2018 CLINICAL DATA:  Abdominal distention with nausea, vomiting and diarrhea 4 days. History of diverticulitis. EXAM: CT ABDOMEN AND PELVIS WITH CONTRAST TECHNIQUE:  Multidetector CT imaging of the abdomen and pelvis was performed using the standard protocol following bolus administration of intravenous contrast. CONTRAST:  130mL ISOVUE-300 IOPAMIDOL (ISOVUE-300) INJECTION 61% COMPARISON:  02/26/2014 FINDINGS: Lower chest: No acute abnormality. Hepatobiliary: Subcentimeter hypodensity over the right lobe of the liver too small to characterize but likely a cyst. Biliary tree is within normal. Possible minimal cholelithiasis. Pancreas: Normal. Spleen: Normal. Adrenals/Urinary Tract: Adrenal glands are normal. Kidneys normal size without hydronephrosis or nephrolithiasis. There is a stable 1.6 cm hypodensity over the lower pole right kidney unchanged and likely a slightly hyperdense cyst. Ureters are normal. Mild increased density over the posterior dependent portion of the bladder which is in continuity with the adjacent mildly prominent prostate gland likely representing prostatic impression upon the bladder base although it would be difficult to exclude a intrinsic bladder mass. Stomach/Bowel: Stomach is normal. There multiple fluid and air-filled dilated small bowel loops to the level of the ileocecal valve measuring  up to 4.8 cm in diameter. There is mild fluid and air-filled prominence of the colon to the level of the mid descending colon with there is an abrupt caliber change and associated irregular soft tissue density suggesting neoplastic stricture. This is likely the site of obstruction. Moderate diverticulosis of the distal descending and sigmoid colon. Appendix demonstrates mild mucosal enhancement and measures 1.2 cm in thickness at the tip. There is no adjacent inflammatory change or free fluid. Vascular/Lymphatic: Minimal atherosclerotic plaque over the abdominal aorta and iliac arteries. No adenopathy. Reproductive: Mild prostatic enlargement. Other: No significant free fluid or free peritoneal air. Musculoskeletal: Degenerative change of the spine and hips.  Multilevel disc disease throughout the lumbar spine. IMPRESSION: Irregular soft tissue density causing stricture of the mid descending colon likely the site of obstruction for the dilated small bowel. 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 No evidence of perforation. Equivocal findings involving the appendix measuring 1.2 cm at the appendiceal tip with mucosal enhancement. No adjacent free fluid or inflammatory change. Findings are nonspecific, but can be seen with early acute appendicitis. Mild prostatic enlargement. Increased density over the posterior bladder base likely due to the large prostatic impression although cannot completely exclude a bladder mass. Urology protocol CT or ultrasound may be helpful for better evaluation. Mild cholelithiasis. Stable 1.5 cm cystic structure over the lower pole right kidney likely slightly hyperdense cyst. Diverticulosis of the colon. Aortic Atherosclerosis (ICD10-I70.0). These results were called by telephone at the time of interpretation on 01/10/2018 at 8:25 pm to Dr. Shirlyn Goltz , who verbally acknowledged these results. Electronically Signed   By: Marin Olp M.D.   On: 01/10/2018 20:25    (Echo, Carotid, EGD, Colonoscopy, ERCP)    Subjective:   Discharge Exam: Vitals:   01/16/18 2156 01/17/18 0438  BP: 111/76 106/75  Pulse: 96 91  Resp: 18 18  Temp: 99.3 F (37.4 C) 97.9 F (36.6 C)  SpO2: 94% 94%   Vitals:   01/16/18 1008 01/16/18 1354 01/16/18 2156 01/17/18 0438  BP: 114/77 101/73 111/76 106/75  Pulse: (!) 112 99 96 91  Resp:  20 18 18   Temp:  98.3 F (36.8 C) 99.3 F (37.4 C) 97.9 F (36.6 C)  TempSrc:  Oral Oral Oral  SpO2:  96% 94% 94%  Weight:      Height:        General: Pt is alert, awake, not in acute distress Cardiovascular: RRR, S1/S2 +, no rubs, no gallops Respiratory: CTA bilaterally, no wheezing, no rhonchi Abdominal: Soft, NT, ND, bowel sounds + Extremities: no edema, no cyanosis    The results of significant  diagnostics from this hospitalization (including imaging, microbiology, ancillary and laboratory) are listed below for reference.     Microbiology: Recent Results (from the past 240 hour(s))  MRSA PCR Screening     Status: None   Collection Time: 01/13/18  9:11 AM  Result Value Ref Range Status   MRSA by PCR NEGATIVE NEGATIVE Final    Comment:        The GeneXpert MRSA Assay (FDA approved for NASAL specimens only), is one component of a comprehensive MRSA colonization surveillance program. It is not intended to diagnose MRSA infection nor to guide or monitor treatment for MRSA infections. Performed at [facility], [place] [address] [place], [place] [address]      Labs: BNP (last 3 results) No results for input(s): BNP in the last 8760 hours. Basic Metabolic Panel: Recent Labs  Lab 01/10/18 1844  01/11/18 0435 01/12/18 0335 01/13/18 0343 01/14/18 0402 01/15/18 0417  NA 135 138 142 141 134* 137  K 3.4* 3.8 4.0 3.6 2.8* 4.2  CL 97* 105 107 105 96* 99  CO2 25 25 25 27 29 30   GLUCOSE 134* 98 124* 105* 116* 115*  BUN 34* 27* 27* 23 14 10   CREATININE 1.33* 1.11 1.21 1.15 0.98 0.97  CALCIUM 8.6* 8.5* 8.1* 8.3* 8.0* 8.1*  MG 2.2  --   --  2.1  --  2.2   Liver Function Tests: Recent Labs  Lab 01/10/18 1844 01/12/18 0335  AST 42* 34  ALT 85* 59*  ALKPHOS 141* 86  BILITOT 1.2 1.6*  PROT 7.7 5.3*  ALBUMIN 4.0 2.6*   Recent Labs  Lab 01/10/18 1844  LIPASE 36   No results for input(s): AMMONIA in the last 168 hours. CBC: Recent Labs  Lab 01/10/18 1844 01/12/18 0335 01/13/18 0343 01/14/18 0402 01/15/18 0417 01/16/18 0423  WBC 5.5 12.7* 8.8 8.2 7.7 7.6  NEUTROABS 2.7  --   --   --   --   --   HGB 15.5 14.8 12.9* 12.6* 12.5* 12.3*  HCT 45.0 44.9 39.4 38.3* 37.5* 36.6*  MCV 87.7 89.4 90.6 89.7 88.9 88.8  PLT 305 277 228 221 232 254   Cardiac Enzymes: No results for input(s): CKTOTAL, CKMB, CKMBINDEX, TROPONINI in the last 168  hours. BNP: Invalid input(s): POCBNP CBG: No results for input(s): GLUCAP in the last 168 hours. D-Dimer No results for input(s): DDIMER in the last 72 hours. Hgb A1c No results for input(s): HGBA1C in the last 72 hours. Lipid Profile No results for input(s): CHOL, HDL, LDLCALC, TRIG, CHOLHDL, LDLDIRECT in the last 72 hours. Thyroid function studies No results for input(s): TSH, T4TOTAL, T3FREE, THYROIDAB in the last 72 hours.  Invalid input(s): FREET3 Anemia work up No results for input(s): VITAMINB12, FOLATE, FERRITIN, TIBC, IRON, RETICCTPCT in the last 72 hours. Urinalysis    Component Value Date/Time   COLORURINE AMBER (A) 01/10/2018 1844   APPEARANCEUR CLOUDY (A) 01/10/2018 1844   LABSPEC >1.030 (H) 01/10/2018 1844   PHURINE 6.0 01/10/2018 1844   GLUCOSEU NEGATIVE 01/10/2018 1844   HGBUR LARGE (A) 01/10/2018 1844   BILIRUBINUR MODERATE (A) 01/10/2018 1844   KETONESUR 15 (A) 01/10/2018 1844   PROTEINUR >300 (A) 01/10/2018 1844   NITRITE NEGATIVE 01/10/2018 1844   LEUKOCYTESUR NEGATIVE 01/10/2018 1844   Sepsis Labs Invalid input(s): PROCALCITONIN,  WBC,  LACTICIDVEN Microbiology Recent Results (from the past 240 hour(s))  MRSA PCR Screening     Status: None   Collection Time: 01/13/18  9:11 AM  Result Value Ref Range Status   MRSA by PCR NEGATIVE NEGATIVE Final    Comment:        The GeneXpert MRSA Assay (FDA approved for NASAL specimens only), is one component of a comprehensive MRSA colonization surveillance program. It is not intended to diagnose MRSA infection nor to guide or monitor treatment for MRSA infections. Performed at [facility], [place] [address], [place], [place] [address]      Time coordinating discharge: 35 minutes  SIGNED:   Georgette Shell, MD  [facility] 01/17/2018, 12:07 PM Pager   If 7PM-7AM, please contact night-coverage [email or web address] Password [email or web address]

## 2018-01-17 NOTE — Progress Notes (Signed)
Subjective:  Chest pain or shortness of breath.Palpation lightheadedness or syncope. Remains in A. fib with moderate ventricular response. Heart rate is better controlled than yesterday.  Objective:  Vital Signs in the last 24 hours: Temp:  [97.9 F (36.6 C)-99.3 F (37.4 C)] 97.9 F (36.6 C) (08/13 0438) Pulse Rate:  [91-99] 91 (08/13 0438) Resp:  [18-20] 18 (08/13 0438) BP: (101-111)/(73-76) 106/75 (08/13 0438) SpO2:  [94 %-96 %] 94 % (08/13 0438)  Intake/Output from previous day: 08/12 0701 - 08/13 0700 In: 874.3 [P.O.:470; I.V.:404.3] Out: 1700 [Stool:1700] Intake/Output from this shift: No intake/output data recorded.  Physical Exam: Neck: no adenopathy, no carotid bruit, no JVD and supple, symmetrical, trachea midline Lungs: clear to auscultation bilaterally Heart: irregularly irregular rhythm, S1, S2 normal and 2/6 systolic murmur noted Abdomen: soft, non-tender; bowel sounds normal; no masses,  no organomegaly and Dressing dry slight gaping of surgical wound as per notes colostomy noted Extremities: extremities normal, atraumatic, no cyanosis or edema  Lab Results: Recent Labs    01/15/18 0417 01/16/18 0423  WBC 7.7 7.6  HGB 12.5* 12.3*  PLT 232 254   Recent Labs    01/15/18 0417  NA 137  K 4.2  CL 99  CO2 30  GLUCOSE 115*  BUN 10  CREATININE 0.97   No results for input(s): TROPONINI in the last 72 hours.  Invalid input(s): CK, MB Hepatic Function Panel No results for input(s): PROT, ALBUMIN, AST, ALT, ALKPHOS, BILITOT, BILIDIR, IBILI in the last 72 hours. No results for input(s): CHOL in the last 72 hours. No results for input(s): PROTIME in the last 72 hours.  Imaging: Imaging results have been reviewed and No results found.  Cardiac Studies:  Assessment/Plan:  Persistent A. Fib with moderate ventricular responsechads vasc score of1 Adenocarcinoma of the colon, status post open left colon resection, takedown splenic flexure and  colostomy.  Plan Change Cardizem to Cardizem CD 240 mg by mouth daily  Reduce amiodarone to 200 mg twice daily Discussed with patient regarding rate control versus rhythm control with DC cardioversion and agrees for rate control for now. Follow-up with me in one week if discharged today  LOS: 7 days    [name] 01/17/2018, 10:41 AM

## 2018-01-17 NOTE — Plan of Care (Signed)
  Problem: Health Behavior/Discharge Planning: Goal: Ability to manage health-related needs will improve Outcome: Progressing   Problem: Clinical Measurements: Goal: Ability to maintain clinical measurements within normal limits will improve Outcome: Progressing Goal: Will remain free from infection Outcome: Progressing Goal: Diagnostic test results will improve Outcome: Progressing Goal: Respiratory complications will improve Outcome: Progressing Goal: Cardiovascular complication will be avoided Outcome: Progressing   Problem: Activity: Goal: Risk for activity intolerance will decrease Outcome: Progressing   Problem: Nutrition: Goal: Adequate nutrition will be maintained Outcome: Progressing   Problem: Coping: Goal: Level of anxiety will decrease Outcome: Progressing   Problem: Elimination: Goal: Will not experience complications related to urinary retention Outcome: Progressing   Problem: Pain Managment: Goal: General experience of comfort will improve Outcome: Progressing   Problem: Safety: Goal: Ability to remain free from injury will improve Outcome: Progressing   Problem: Skin Integrity: Goal: Risk for impaired skin integrity will decrease Outcome: Progressing   Problem: Education: Goal: Knowledge of disease or condition will improve Outcome: Progressing Goal: Understanding of medication regimen will improve Outcome: Progressing   Problem: Activity: Goal: Ability to tolerate increased activity will improve Outcome: Progressing   Problem: Cardiac: Goal: Ability to achieve and maintain adequate cardiopulmonary perfusion will improve Outcome: Progressing   Problem: Health Behavior/Discharge Planning: Goal: Ability to safely manage health-related needs after discharge will improve Outcome: Progressing

## 2018-01-18 ENCOUNTER — Other Ambulatory Visit: Payer: Self-pay | Admitting: Hematology

## 2018-01-18 DIAGNOSIS — D490 Neoplasm of unspecified behavior of digestive system: Secondary | ICD-10-CM

## 2018-01-19 NOTE — Progress Notes (Signed)
Called and left VM advising of CT scan apt on 01/23/18 @ 3 PM [facility]. Patient to arrive at 2:45 PM. Requested call back to confirm this message. Desk phone number provided for call back.

## 2018-01-23 ENCOUNTER — Ambulatory Visit (HOSPITAL_COMMUNITY)
Admission: RE | Admit: 2018-01-23 | Discharge: 2018-01-23 | Disposition: A | Discharge status: Home / Self Care | Payer: 59 | Source: Ambulatory Visit | Attending: Hematology | Admitting: Hematology

## 2018-01-23 DIAGNOSIS — I700 Atherosclerosis of aorta: Secondary | ICD-10-CM | POA: Insufficient documentation

## 2018-01-23 DIAGNOSIS — R918 Other nonspecific abnormal finding of lung field: Secondary | ICD-10-CM | POA: Diagnosis not present

## 2018-01-23 DIAGNOSIS — D490 Neoplasm of unspecified behavior of digestive system: Secondary | ICD-10-CM | POA: Diagnosis not present

## 2018-01-23 DIAGNOSIS — J90 Pleural effusion, not elsewhere classified: Secondary | ICD-10-CM | POA: Insufficient documentation

## 2018-01-29 NOTE — Progress Notes (Addendum)
[place]  [phone] [phone]  Clinic Follow up Note   Patient Care Team: [name], FNP as PCP - General (Family Medicine) 01/30/2018  SUMMARY OF ONCOLOGIC HISTORY:   Cancer of left colon ([place])   01/10/2018 Imaging    CT AP W Contrast 01/10/18  IMPRESSION: Irregular soft tissue density causing stricture of the mid descending colon likely the site of obstruction for the dilated small bowel. 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 No evidence of perforation.  Equivocal findings involving the appendix measuring 1.2 cm at the appendiceal tip with mucosal enhancement. No adjacent free fluid or inflammatory change. Findings are nonspecific, but can be seen with early acute appendicitis.  Mild prostatic enlargement. Increased density over the posterior bladder base likely due to the large prostatic impression although cannot completely exclude a bladder mass. Urology protocol CT or ultrasound may be helpful for better evaluation.  Mild cholelithiasis.  Stable 1.5 cm cystic structure over the lower pole right kidney likely slightly hyperdense cyst.  Diverticulosis of the colon.  Aortic Atherosclerosis (ICD10-I70.0).      01/11/2018 Cancer Staging    Staging form: Colon and Rectum, AJCC 8th Edition - Pathologic stage from 01/11/2018: Stage IIIB (pT3, pN1c, cM0) - Signed by [name], MD on 01/16/2018    01/11/2018 Surgery    LEFT COLON RESECTION, TAKEDOWN SPLENIC FLEXURE, COLOSTOMY by Dr. [name]     01/11/2018 Procedure    Colonoscopy 01/11/18 by Dr. [name]  - Malignant completely obstructing tumor in the mid descending colon. Tattooed. - Diverticulosis in the sigmoid colon. - Non-bleeding internal hemorrhoids. - No specimens collected.    01/11/2018 Pathology Results    Diagnosis 01/11/18  1. Colon, segmental resection for tumor, descending colon - INVASIVE COLORECTAL ADENOCARCINOMA, 4 CM. - TUMOR EXTENDS INTO PERICOLONIC CONNECTIVE  TISSUE. - TUMOR FOCALLY INVOLVES RADIAL MARGIN. - ONE MESENTERIC TUMOR DEPOSIT. - THIRTEEN BENIGN LYMPH NODES (0/13). 2. Colon, segmental resection, splenic flexure - BENIGN COLON. - NO EVIDENCE OF MALIGNANCY .    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 No evidence for metastatic disease within the chest. 2. Small left pleural effusion with underlying opacities which may represent atelectasis. Right basilar atelectasis. 3. Few foci of gas within the upper abdomen in the omentum with surrounding fat stranding, likely postsurgical 4. Aortic Atherosclerosis (ICD10-I70.0).   CURRENT THERAPY: PENDING adjuvant CAPOX q3 weeks x3 months vs FOLFOX q2 weeks x6 months   INTERVAL HISTORY: Dave Vaughn returns for follow up and treatment discussion as scheduled. He presents with his wife. He is recovering well at home. Energy level is improving but remains to have slightly low stamina. He plans to resume exercise with outside walking this week. He is adapting to colostomy, output is soft, occasionally liquid. Empties bag q4-6 hours. On stool softener. Denies nausea or vomiting. Abdominal incision is open, requires wet to dry dressing changes BID, spouse performs. Pain is managed with tylenol, 2 tabs BID. Rates pain 2-6/10 depending on activity. He saw Dr. [name] last week, next f/u on 9/30. He denies fever or chills. Continues on eliquis for Afib; followed by Dr. [name]. Denies bleeding, chest pain, palpitations, or dyspnea.   REVIEW OF SYSTEMS:   Constitutional: Denies fevers, chills or abnormal weight loss (+) fatigue, improving  Ears, nose, mouth, throat, and face: Denies  mucositis or sore throat Respiratory: Denies cough, dyspnea or wheezes Cardiovascular: Denies palpitation, chest discomfort or lower extremity swelling Gastrointestinal:  Denies nausea, vomiting,  constipation, heartburn or change in bowel habits (+) colostomy with soft, occasionally liquid output (+) open abdominal incision  Skin: Denies abnormal skin rashes Lymphatics: Denies new lymphadenopathy or easy bruising Neurological:Denies numbness, tingling or new weaknesses All other systems were reviewed with the patient and are negative.  MEDICAL HISTORY:  Past Medical History:  Diagnosis Date  . Diverticulitis     SURGICAL HISTORY: Past Surgical History:  Procedure Laterality Date  . ANKLE SURGERY     when he was in college  . COLON RESECTION N/A 01/11/2018   Procedure: LEFT COLON RESECTION, TAKEDOWN SPLENIC FLEXURE, COLOSTOMY;  Surgeon: [name], MD;  Location: [facility];  Service: General;  Laterality: N/A;  . COLONOSCOPY  01/11/2018   Procedure: COLONOSCOPY;  Surgeon: Jackquline Denmark, MD;  Location: [facility];  Service: Endoscopy;;  . thumb surgery       I have reviewed the social history and family history with the patient and they are unchanged from previous note.  ALLERGIES:  has No Known Allergies.  MEDICATIONS:  Current Outpatient Medications  Medication Sig Dispense Refill  . amiodarone (PACERONE) 200 MG tablet Take 1 tablet (200 mg total) by mouth 2 (two) times daily. 60 tablet 0  . apixaban (ELIQUIS) 5 MG TABS tablet Take 1 tablet (5 mg total) by mouth 2 (two) times daily. 60 tablet 0  . aspirin EC 81 MG tablet Take 81 mg by mouth daily.    [name] diltiazem (CARTIA XT) 240 MG 24 hr capsule Take 1 capsule (240 mg total) by mouth daily. 30 capsule 11  . Multiple Vitamin (MULTIVITAMIN WITH MINERALS) TABS tablet Take 1 tablet by mouth daily.    . traMADol (ULTRAM) 50 MG tablet Take 1 tablet (50 mg total) by mouth every 6 (six) hours as needed for moderate pain or severe pain (pain not relieved by Tylenol). 30 tablet 0   No current facility-administered medications for this visit.     PHYSICAL EXAMINATION: ECOG PERFORMANCE STATUS: 1-2  Vitals:   01/30/18 0909  BP: (!)  141/81  Pulse: 72  Resp: 18  Temp: 98.4 F (36.9 C)  SpO2: 99%   Filed Weights   01/30/18 0909  Weight: 195 lb 4.8 oz (88.6 kg)    GENERAL: alert, no distress and comfortable SKIN: skin color, texture, turgor are normal, no rashes  EYES: sclera clear OROPHARYNX:no thrush or ulcers LYMPH:  no palpable cervical or supraclavicular lymphadenopathy LUNGS: clear to auscultation with normal breathing effort HEART: Afib, no murmurs; no lower extremity edema ABDOMEN:abdomen soft with normal bowel sounds. Colostomy in place. Open vertical midline incision 5.5 x10 cm, 3 cm at its deepest  Musculoskeletal:no cyanosis of digits and no clubbing  NEURO: alert & oriented x 3 with fluent speech, no focal motor/sensory deficits      LABORATORY DATA:  I have reviewed the data as listed CBC Latest Ref Rng & Units 01/16/2018 01/15/2018 01/14/2018  WBC 4.0 - 10.5 K/uL 7.6 7.7 8.2  Hemoglobin 13.0 - 17.0 g/dL 12.3(L) 12.5(L) 12.6(L)  Hematocrit 39.0 - 52.0 % 36.6(L) 37.5(L) 38.3(L)  Platelets 150 - 400 K/uL 254 232 221     CMP Latest Ref Rng & Units 01/15/2018 01/14/2018 01/13/2018  Glucose 70 - 99 mg/dL 115(H) 116(H) 105(H)  BUN 8 - 23 mg/dL 10 14 23   Creatinine 0.61 - 1.24 mg/dL 0.97 0.98 1.15  Sodium 135 - 145 mmol/L 137 134(L) 141  Potassium 3.5 - 5.1 mmol/L 4.2 2.8(L) 3.6  Chloride 98 - 111 mmol/L 99 96(L) 105  CO2 22 - 32 mmol/L 30 29 27   Calcium 8.9 - 10.3 mg/dL 8.1(L) 8.0(L) 8.3(L)  Total Protein 6.5 - 8.1 g/dL - - -  Total Bilirubin 0.3 - 1.2 mg/dL - - -  Alkaline Phos 38 - 126 U/L - - -  AST 15 - 41 U/L - - -  ALT 0 - 44 U/L - - -      RADIOGRAPHIC STUDIES: I have personally reviewed the radiological images as listed and agreed with the findings in the report. No results found.   ASSESSMENT & PLAN: Dave Vaughn is a 72 y.o. Caucasian male with a history of diverticulitis who presented to the ED for abdominal pain and was found to have cancer of the descending colon.    1.  Cancer of left colon, Stage IIIB (pT3N1cMx), MSI-stable -We again reviewed surgical pathology in detail; given his locally advanced, MSI-stable disease and high risk features, Dr. Burr Medico recommends adjuvant chemotherapy with FOLFOX q2 weeks x6 months vs CAPOX q3 weeks x3 months to reduce risk of recurrence. Printed material was provided at today's visit. -His case was discussed in tumor board, no need for additional surgery or radiation for positive radial margin  -we reviewed CT chest was negative for distant metastasis  -He is recovering well from surgery and hospitalization, his performance status is good; but surgical incision remains open; we will delay adjuvant chemotherapy for wound healing. -FOLFOX and CAPOX were discussed with the patient in detail; he favors a shorter course of chemo and to avoid PAC if possible  -Dr. Burr Medico discussed if he has prolonged recovery/healing and he is not able to start treatment soon, she will not offer adjuvant chemo if it cannot be started within 3 months, due to the decreased benefit. Certainly the benefit is greatest within the first 6-8 weeks, this was discussed with the patient.  -MD answered questions about cancer recurrence if he does not undergo chemo, and she again reviewed surveillance plan going forward. -Labs reviewed, he was mildly anemia on 8/12, no other hematological abnormalities.  -He will return for lab, f/u in 3-4 weeks to assess wound healing; patient will call to schedule f/u with Dr. [name] day before f/u with [place]. -In the meantime we encouraged him to eat well, hydrate, gain strength, and be active in order to tolerate chemo well.   2. AFib, found during hospitalization  - on apixaban, amiodarone, diltiazem followed by Dr. [name]     PLAN: -Labs, pathology, CT chest reviewed -Delay adjuvant chemo due to wound healing  -Lab, f/u in 3-4 weeks to assess wound healing and finalize treatment plan -Patient to call Dr. [name] for f/u day  before he returns    Orders Placed This Encounter  Procedures  . CBC with Differential (Cancer Center Only)    Standing Status:   Standing    Number of Occurrences:   20    Standing Expiration Date:   01/31/2019  . CMP ([place] only)    Standing Status:   Standing    Number of Occurrences:   20    Standing Expiration Date:   01/31/2019  . CEA (IN HOUSE-[facility])    Standing Status:   Standing    Number of Occurrences:   20    Standing Expiration Date:   01/31/2019   All questions were answered. The  patient knows to call the clinic with any problems, questions or concerns. No barriers to learning was detected. I spent 30 minutes counseling the patient face to face. The total time spent in the appointment was 40 minutes and more than 50% was on counseling and review of test results     Alla Feeling, NP 01/30/18   Addendum  I have seen the patient, examined him. I agree with the assessment and and plan and have edited the notes.   Dave Vaughn is recovering well from surgery, except that he has a large open wound at the incision site, which may take a few months to heal.  We again reviewed his surgical pathology, and staging scan findings.  His case was discussed in the GI tumor board a few weeks ago, no second surgery or adjuvant radiation is needed for his positive radial margin, and adjuvant chemotherapy is recommended due to the moderate to high risk of recurrence.  We again reviewed the regimens of 6 months FOLFOX versus 3 months CAPOX, he is leaning towards CAPOX.  Chemotherapy will be postponed due to his wound healing issue, plan to see him back in 3-4 weeks start adjuvant chemo if his wound is healing well with a small residual wound.   [name]  01/30/2018

## 2018-01-30 ENCOUNTER — Ambulatory Visit: Payer: 59 | Admitting: Oncology

## 2018-01-30 ENCOUNTER — Encounter: Payer: Self-pay | Admitting: Nurse Practitioner

## 2018-01-30 ENCOUNTER — Telehealth: Payer: Self-pay | Admitting: Hematology

## 2018-01-30 ENCOUNTER — Inpatient Hospital Stay: Payer: 59 | Attending: Nurse Practitioner | Admitting: Nurse Practitioner

## 2018-01-30 VITALS — BP 141/81 | HR 72 | Temp 98.4°F | Resp 18 | Ht 70.0 in | Wt 195.3 lb

## 2018-01-30 DIAGNOSIS — C186 Malignant neoplasm of descending colon: Secondary | ICD-10-CM | POA: Diagnosis not present

## 2018-01-30 DIAGNOSIS — I4891 Unspecified atrial fibrillation: Secondary | ICD-10-CM

## 2018-01-30 DIAGNOSIS — D490 Neoplasm of unspecified behavior of digestive system: Secondary | ICD-10-CM

## 2018-01-30 DIAGNOSIS — D649 Anemia, unspecified: Secondary | ICD-10-CM | POA: Insufficient documentation

## 2018-01-30 NOTE — Telephone Encounter (Signed)
Appts scheduled AVS/Calendar printed per 8/26 los °

## 2018-01-30 NOTE — Patient Instructions (Signed)
FOLFOX: IV chemo given once every 2 weeks, chemo pump over 2 days then off for 2 weeks  (oxaliplatin, leucovorin, 5FU)  Oxaliplatin Injection What is this medicine? OXALIPLATIN (ox AL i PLA tin) is a chemotherapy drug. It targets fast dividing cells, like cancer cells, and causes these cells to die. This medicine is used to treat cancers of the colon and rectum, and many other cancers. This medicine may be used for other purposes; ask your health care provider or pharmacist if you have questions. COMMON BRAND NAME(S): Eloxatin What should I tell my health care provider before I take this medicine? They need to know if you have any of these conditions: -kidney disease -an unusual or allergic reaction to oxaliplatin, other chemotherapy, other medicines, foods, dyes, or preservatives -pregnant or trying to get pregnant -breast-feeding How should I use this medicine? This drug is given as an infusion into a vein. It is administered in a hospital or clinic by a specially trained health care professional. Talk to your pediatrician regarding the use of this medicine in children. Special care may be needed. Overdosage: If you think you have taken too much of this medicine contact a poison control center or emergency room at once. NOTE: This medicine is only for you. Do not share this medicine with others. What if I miss a dose? It is important not to miss a dose. Call your doctor or health care professional if you are unable to keep an appointment. What may interact with this medicine? -medicines to increase blood counts like filgrastim, pegfilgrastim, sargramostim -probenecid -some antibiotics like amikacin, gentamicin, neomycin, polymyxin B, streptomycin, tobramycin -zalcitabine Talk to your doctor or health care professional before taking any of these medicines: -acetaminophen -aspirin -ibuprofen -ketoprofen -naproxen This list may not describe all possible interactions. Give your health  care provider a list of all the medicines, herbs, non-prescription drugs, or dietary supplements you use. Also tell them if you smoke, drink alcohol, or use illegal drugs. Some items may interact with your medicine. What should I watch for while using this medicine? Your condition will be monitored carefully while you are receiving this medicine. You will need important blood work done while you are taking this medicine. This medicine can make you more sensitive to cold. Do not drink cold drinks or use ice. Cover exposed skin before coming in contact with cold temperatures or cold objects. When out in cold weather wear warm clothing and cover your mouth and nose to warm the air that goes into your lungs. Tell your doctor if you get sensitive to the cold. This drug may make you feel generally unwell. This is not uncommon, as chemotherapy can affect healthy cells as well as cancer cells. Report any side effects. Continue your course of treatment even though you feel ill unless your doctor tells you to stop. In some cases, you may be given additional medicines to help with side effects. Follow all directions for their use. Call your doctor or health care professional for advice if you get a fever, chills or sore throat, or other symptoms of a cold or flu. Do not treat yourself. This drug decreases your body's ability to fight infections. Try to avoid being around people who are sick. This medicine may increase your risk to bruise or bleed. Call your doctor or health care professional if you notice any unusual bleeding. Be careful brushing and flossing your teeth or using a toothpick because you may get an infection or bleed more easily. If  you have any dental work done, tell your dentist you are receiving this medicine. Avoid taking products that contain aspirin, acetaminophen, ibuprofen, naproxen, or ketoprofen unless instructed by your doctor. These medicines may hide a fever. Do not become pregnant while  taking this medicine. Women should inform their doctor if they wish to become pregnant or think they might be pregnant. There is a potential for serious side effects to an unborn child. Talk to your health care professional or pharmacist for more information. Do not breast-feed an infant while taking this medicine. Call your doctor or health care professional if you get diarrhea. Do not treat yourself. What side effects may I notice from receiving this medicine? Side effects that you should report to your doctor or health care professional as soon as possible: -allergic reactions like skin rash, itching or hives, swelling of the face, lips, or tongue -low blood counts - This drug may decrease the number of white blood cells, red blood cells and platelets. You may be at increased risk for infections and bleeding. -signs of infection - fever or chills, cough, sore throat, pain or difficulty passing urine -signs of decreased platelets or bleeding - bruising, pinpoint red spots on the skin, black, tarry stools, nosebleeds -signs of decreased red blood cells - unusually weak or tired, fainting spells, lightheadedness -breathing problems -chest pain, pressure -cough -diarrhea -jaw tightness -mouth sores -nausea and vomiting -pain, swelling, redness or irritation at the injection site -pain, tingling, numbness in the hands or feet -problems with balance, talking, walking -redness, blistering, peeling or loosening of the skin, including inside the mouth -trouble passing urine or change in the amount of urine Side effects that usually do not require medical attention (report to your doctor or health care professional if they continue or are bothersome): -changes in vision -constipation -hair loss -loss of appetite -metallic taste in the mouth or changes in taste -stomach pain This list may not describe all possible side effects. Call your doctor for medical advice about side effects. You may  report side effects to FDA at 1-800-FDA-1088. Where should I keep my medicine? This drug is given in a hospital or clinic and will not be stored at home. NOTE: This sheet is a summary. It may not cover all possible information. If you have questions about this medicine, talk to your doctor, pharmacist, or health care provider.  2018 [employer]/Gold Standard (2007-12-19 17:22:47)  Leucovorin injection What is this medicine? LEUCOVORIN (loo koe VOR in) is used to prevent or treat the harmful effects of some medicines. This medicine is used to treat anemia caused by a low amount of folic acid in the body. It is also used with 5-fluorouracil (5-FU) to treat colon cancer. This medicine may be used for other purposes; ask your health care provider or pharmacist if you have questions. What should I tell my health care provider before I take this medicine? They need to know if you have any of these conditions: -anemia from low levels of vitamin B-12 in the blood -an unusual or allergic reaction to leucovorin, folic acid, other medicines, foods, dyes, or preservatives -pregnant or trying to get pregnant -breast-feeding How should I use this medicine? This medicine is for injection into a muscle or into a vein. It is given by a health care professional in a hospital or clinic setting. Talk to your pediatrician regarding the use of this medicine in children. Special care may be needed. Overdosage: If you think you have taken too much of  this medicine contact a poison control center or emergency room at once. NOTE: This medicine is only for you. Do not share this medicine with others. What if I miss a dose? This does not apply. What may interact with this medicine? -capecitabine -fluorouracil -phenobarbital -phenytoin -primidone -trimethoprim-sulfamethoxazole This list may not describe all possible interactions. Give your health care provider a list of all the medicines, herbs, non-prescription  drugs, or dietary supplements you use. Also tell them if you smoke, drink alcohol, or use illegal drugs. Some items may interact with your medicine. What should I watch for while using this medicine? Your condition will be monitored carefully while you are receiving this medicine. This medicine may increase the side effects of 5-fluorouracil, 5-FU. Tell your doctor or health care professional if you have diarrhea or mouth sores that do not get better or that get worse. What side effects may I notice from receiving this medicine? Side effects that you should report to your doctor or health care professional as soon as possible: -allergic reactions like skin rash, itching or hives, swelling of the face, lips, or tongue -breathing problems -fever, infection -mouth sores -unusual bleeding or bruising -unusually weak or tired Side effects that usually do not require medical attention (report to your doctor or health care professional if they continue or are bothersome): -constipation or diarrhea -loss of appetite -nausea, vomiting This list may not describe all possible side effects. Call your doctor for medical advice about side effects. You may report side effects to FDA at 1-800-FDA-1088. Where should I keep my medicine? This drug is given in a hospital or clinic and will not be stored at home. NOTE: This sheet is a summary. It may not cover all possible information. If you have questions about this medicine, talk to your doctor, pharmacist, or health care provider.  2018 [employer]/Gold Standard (2007-11-28 16:50:29)  Fluorouracil, 5-FU injection What is this medicine? FLUOROURACIL, 5-FU (flure oh YOOR a sil) is a chemotherapy drug. It slows the growth of cancer cells. This medicine is used to treat many types of cancer like breast cancer, colon or rectal cancer, pancreatic cancer, and stomach cancer. This medicine may be used for other purposes; ask your health care provider or pharmacist if  you have questions. COMMON BRAND NAME(S): Adrucil What should I tell my health care provider before I take this medicine? They need to know if you have any of these conditions: -blood disorders -dihydropyrimidine dehydrogenase (DPD) deficiency -infection (especially a virus infection such as chickenpox, cold sores, or herpes) -kidney disease -liver disease -malnourished, poor nutrition -recent or ongoing radiation therapy -an unusual or allergic reaction to fluorouracil, other chemotherapy, other medicines, foods, dyes, or preservatives -pregnant or trying to get pregnant -breast-feeding How should I use this medicine? This drug is given as an infusion or injection into a vein. It is administered in a hospital or clinic by a specially trained health care professional. Talk to your pediatrician regarding the use of this medicine in children. Special care may be needed. Overdosage: If you think you have taken too much of this medicine contact a poison control center or emergency room at once. NOTE: This medicine is only for you. Do not share this medicine with others. What if I miss a dose? It is important not to miss your dose. Call your doctor or health care professional if you are unable to keep an appointment. What may interact with this medicine? -allopurinol -cimetidine -dapsone -digoxin -hydroxyurea -leucovorin -levamisole -medicines for seizures like  ethotoin, fosphenytoin, phenytoin -medicines to increase blood counts like filgrastim, pegfilgrastim, sargramostim -medicines that treat or prevent blood clots like warfarin, enoxaparin, and dalteparin -methotrexate -metronidazole -pyrimethamine -some other chemotherapy drugs like busulfan, cisplatin, estramustine, vinblastine -trimethoprim -trimetrexate -vaccines Talk to your doctor or health care professional before taking any of these medicines: -acetaminophen -aspirin -ibuprofen -ketoprofen -naproxen This list may  not describe all possible interactions. Give your health care provider a list of all the medicines, herbs, non-prescription drugs, or dietary supplements you use. Also tell them if you smoke, drink alcohol, or use illegal drugs. Some items may interact with your medicine. What should I watch for while using this medicine? Visit your doctor for checks on your progress. This drug may make you feel generally unwell. This is not uncommon, as chemotherapy can affect healthy cells as well as cancer cells. Report any side effects. Continue your course of treatment even though you feel ill unless your doctor tells you to stop. In some cases, you may be given additional medicines to help with side effects. Follow all directions for their use. Call your doctor or health care professional for advice if you get a fever, chills or sore throat, or other symptoms of a cold or flu. Do not treat yourself. This drug decreases your body's ability to fight infections. Try to avoid being around people who are sick. This medicine may increase your risk to bruise or bleed. Call your doctor or health care professional if you notice any unusual bleeding. Be careful brushing and flossing your teeth or using a toothpick because you may get an infection or bleed more easily. If you have any dental work done, tell your dentist you are receiving this medicine. Avoid taking products that contain aspirin, acetaminophen, ibuprofen, naproxen, or ketoprofen unless instructed by your doctor. These medicines may hide a fever. Do not become pregnant while taking this medicine. Women should inform their doctor if they wish to become pregnant or think they might be pregnant. There is a potential for serious side effects to an unborn child. Talk to your health care professional or pharmacist for more information. Do not breast-feed an infant while taking this medicine. Men should inform their doctor if they wish to father a child. This medicine  may lower sperm counts. Do not treat diarrhea with over the counter products. Contact your doctor if you have diarrhea that lasts more than 2 days or if it is severe and watery. This medicine can make you more sensitive to the sun. Keep out of the sun. If you cannot avoid being in the sun, wear protective clothing and use sunscreen. Do not use sun lamps or tanning beds/booths. What side effects may I notice from receiving this medicine? Side effects that you should report to your doctor or health care professional as soon as possible: -allergic reactions like skin rash, itching or hives, swelling of the face, lips, or tongue -low blood counts - this medicine may decrease the number of white blood cells, red blood cells and platelets. You may be at increased risk for infections and bleeding. -signs of infection - fever or chills, cough, sore throat, pain or difficulty passing urine -signs of decreased platelets or bleeding - bruising, pinpoint red spots on the skin, black, tarry stools, blood in the urine -signs of decreased red blood cells - unusually weak or tired, fainting spells, lightheadedness -breathing problems -changes in vision -chest pain -mouth sores -nausea and vomiting -pain, swelling, redness at site where injected -pain, tingling, numbness  in the hands or feet -redness, swelling, or sores on hands or feet -stomach pain -unusual bleeding Side effects that usually do not require medical attention (report to your doctor or health care professional if they continue or are bothersome): -changes in finger or toe nails -diarrhea -dry or itchy skin -hair loss -headache -loss of appetite -sensitivity of eyes to the light -stomach upset -unusually teary eyes This list may not describe all possible side effects. Call your doctor for medical advice about side effects. You may report side effects to FDA at 1-800-FDA-1088. Where should I keep my medicine? This drug is given in a  hospital or clinic and will not be stored at home. NOTE: This sheet is a summary. It may not cover all possible information. If you have questions about this medicine, talk to your doctor, pharmacist, or health care provider.  2018 [employer]/Gold Standard (2007-09-27 13:53:16)  CAPOX given every 3 weeks for 3 months; IV oxaliplatin on day 1, take xeloda twice daily for 14 days (Capecetabine/Xeloda, oxaliplatin)  Capecitabine tablets What is this medicine? CAPECITABINE (ka pe SITE a been) is a chemotherapy drug. It slows the growth of cancer cells. This medicine is used to treat breast cancer, and also colon or rectal cancer. This medicine may be used for other purposes; ask your health care provider or pharmacist if you have questions. COMMON BRAND NAME(S): Xeloda What should I tell my health care provider before I take this medicine? They need to know if you have any of these conditions: -bleeding or blood disorders -dihydropyrimidine dehydrogenase (DPD) deficiency -heart disease -infection (especially a virus infection such as chickenpox, cold sores, or herpes) -kidney disease -liver disease -an unusual or allergic reaction to capecitabine, 5-fluorouracil, other medicines, foods, dyes, or preservatives -pregnant or trying to get pregnant -breast-feeding How should I use this medicine? Take this medicine by mouth with a glass of water, within 30 minutes of the end of a meal. Do not cut, crush or chew this medicine. Follow the directions on the prescription label. Take your medicine at regular intervals. Do not take it more often than directed. Do not stop taking except on your doctor's advice. Your doctor may want you to take a combination of 150 mg and 500 mg tablets for each dose. It is very important that you know how to correctly take your dose. Taking the wrong tablets could result in an overdose (too much medication) or underdose (too little medication). Talk to your pediatrician  regarding the use of this medicine in children. Special care may be needed. Overdosage: If you think you have taken too much of this medicine contact a poison control center or emergency room at once. NOTE: This medicine is only for you. Do not share this medicine with others. What if I miss a dose? If you miss a dose, do not take the missed dose at all. Do not take double or extra doses. Instead, continue with your next scheduled dose and check with your doctor. What may interact with this medicine? -antacids with aluminum and/or magnesium -folic acid -leucovorin -medicines to increase blood counts like filgrastim, pegfilgrastim, sargramostim -phenytoin -vaccines -warfarin Talk to your doctor or health care professional before taking any of these medicines: -acetaminophen -aspirin -ibuprofen -ketoprofen -naproxen This list may not describe all possible interactions. Give your health care provider a list of all the medicines, herbs, non-prescription drugs, or dietary supplements you use. Also tell them if you smoke, drink alcohol, or use illegal drugs. Some items may interact  with your medicine. What should I watch for while using this medicine? Visit your doctor for checks on your progress. This drug may make you feel generally unwell. This is not uncommon, as chemotherapy can affect healthy cells as well as cancer cells. Report any side effects. Continue your course of treatment even though you feel ill unless your doctor tells you to stop. In some cases, you may be given additional medicines to help with side effects. Follow all directions for their use. Call your doctor or health care professional for advice if you get a fever, chills or sore throat, or other symptoms of a cold or flu. Do not treat yourself. This drug decreases your body's ability to fight infections. Try to avoid being around people who are sick. This medicine may increase your risk to bruise or bleed. Call your doctor  or health care professional if you notice any unusual bleeding. Be careful brushing and flossing your teeth or using a toothpick because you may get an infection or bleed more easily. If you have any dental work done, tell your dentist you are receiving this medicine. Avoid taking products that contain aspirin, acetaminophen, ibuprofen, naproxen, or ketoprofen unless instructed by your doctor. These medicines may hide a fever. Do not become pregnant while taking this medicine or for 6 months after stopping it. Women should inform their doctor if they wish to become pregnant or think they might be pregnant. There is a potential for serious side effects to an unborn child. Talk to your health care professional or pharmacist for more information. Do not breast-feed an infant while taking this medicine or for 2 weeks after stopping it. Men are advised not to father a child while taking this medicine or for 3 months after stopping it. This medicine may make it more difficult to get pregnant or father a child. Talk with your doctor or health care professional if you are concerned about your fertility. What side effects may I notice from receiving this medicine? Side effects that you should report to your doctor or health care professional as soon as possible: -allergic reactions like skin rash, itching or hives, swelling of the face, lips, or tongue -low blood counts - this medicine may decrease the number of white blood cells, red blood cells and platelets. You may be at increased risk for infections and bleeding. -signs of infection - fever or chills, cough, sore throat, pain or difficulty passing urine -signs of decreased platelets or bleeding - bruising, pinpoint red spots on the skin, black, tarry stools, blood in the urine -signs of decreased red blood cells - unusually weak or tired, fainting spells, lightheadedness -breathing problems -changes in vision -chest pain -dark urine -diarrhea of more  than 4 bowel movements in one day or any diarrhea at night; bloody or watery diarrhea -dizziness -mouth sores -nausea and vomiting -pain, tingling, numbness in the hands or feet -redness, swelling, or sores on hands or feet -stomach pain -vomiting -yellow color of skin or eyes Side effects that usually do not require medical attention (report to your doctor or health care professional if they continue or are bothersome): -constipation -diarrhea -dry or itchy skin -hair loss -loss of appetite -nausea -weak or tired This list may not describe all possible side effects. Call your doctor for medical advice about side effects. You may report side effects to FDA at 1-800-FDA-1088. Where should I keep my medicine? Keep out of the reach of children. Store at room temperature between 15 and 30  degrees C (59 and 86 degrees F). Keep container tightly closed. Throw away any unused medicine after the expiration date. NOTE: This sheet is a summary. It may not cover all possible information. If you have questions about this medicine, talk to your doctor, pharmacist, or health care provider.  2018 [employer]/Gold Standard (2015-06-11 13:11:21)  Oxaliplatin Injection What is this medicine? OXALIPLATIN (ox AL i PLA tin) is a chemotherapy drug. It targets fast dividing cells, like cancer cells, and causes these cells to die. This medicine is used to treat cancers of the colon and rectum, and many other cancers. This medicine may be used for other purposes; ask your health care provider or pharmacist if you have questions. COMMON BRAND NAME(S): Eloxatin What should I tell my health care provider before I take this medicine? They need to know if you have any of these conditions: -kidney disease -an unusual or allergic reaction to oxaliplatin, other chemotherapy, other medicines, foods, dyes, or preservatives -pregnant or trying to get pregnant -breast-feeding How should I use this medicine? This drug  is given as an infusion into a vein. It is administered in a hospital or clinic by a specially trained health care professional. Talk to your pediatrician regarding the use of this medicine in children. Special care may be needed. Overdosage: If you think you have taken too much of this medicine contact a poison control center or emergency room at once. NOTE: This medicine is only for you. Do not share this medicine with others. What if I miss a dose? It is important not to miss a dose. Call your doctor or health care professional if you are unable to keep an appointment. What may interact with this medicine? -medicines to increase blood counts like filgrastim, pegfilgrastim, sargramostim -probenecid -some antibiotics like amikacin, gentamicin, neomycin, polymyxin B, streptomycin, tobramycin -zalcitabine Talk to your doctor or health care professional before taking any of these medicines: -acetaminophen -aspirin -ibuprofen -ketoprofen -naproxen This list may not describe all possible interactions. Give your health care provider a list of all the medicines, herbs, non-prescription drugs, or dietary supplements you use. Also tell them if you smoke, drink alcohol, or use illegal drugs. Some items may interact with your medicine. What should I watch for while using this medicine? Your condition will be monitored carefully while you are receiving this medicine. You will need important blood work done while you are taking this medicine. This medicine can make you more sensitive to cold. Do not drink cold drinks or use ice. Cover exposed skin before coming in contact with cold temperatures or cold objects. When out in cold weather wear warm clothing and cover your mouth and nose to warm the air that goes into your lungs. Tell your doctor if you get sensitive to the cold. This drug may make you feel generally unwell. This is not uncommon, as chemotherapy can affect healthy cells as well as cancer cells.  Report any side effects. Continue your course of treatment even though you feel ill unless your doctor tells you to stop. In some cases, you may be given additional medicines to help with side effects. Follow all directions for their use. Call your doctor or health care professional for advice if you get a fever, chills or sore throat, or other symptoms of a cold or flu. Do not treat yourself. This drug decreases your body's ability to fight infections. Try to avoid being around people who are sick. This medicine may increase your risk to bruise or bleed. Call  your doctor or health care professional if you notice any unusual bleeding. Be careful brushing and flossing your teeth or using a toothpick because you may get an infection or bleed more easily. If you have any dental work done, tell your dentist you are receiving this medicine. Avoid taking products that contain aspirin, acetaminophen, ibuprofen, naproxen, or ketoprofen unless instructed by your doctor. These medicines may hide a fever. Do not become pregnant while taking this medicine. Women should inform their doctor if they wish to become pregnant or think they might be pregnant. There is a potential for serious side effects to an unborn child. Talk to your health care professional or pharmacist for more information. Do not breast-feed an infant while taking this medicine. Call your doctor or health care professional if you get diarrhea. Do not treat yourself. What side effects may I notice from receiving this medicine? Side effects that you should report to your doctor or health care professional as soon as possible: -allergic reactions like skin rash, itching or hives, swelling of the face, lips, or tongue -low blood counts - This drug may decrease the number of white blood cells, red blood cells and platelets. You may be at increased risk for infections and bleeding. -signs of infection - fever or chills, cough, sore throat, pain or  difficulty passing urine -signs of decreased platelets or bleeding - bruising, pinpoint red spots on the skin, black, tarry stools, nosebleeds -signs of decreased red blood cells - unusually weak or tired, fainting spells, lightheadedness -breathing problems -chest pain, pressure -cough -diarrhea -jaw tightness -mouth sores -nausea and vomiting -pain, swelling, redness or irritation at the injection site -pain, tingling, numbness in the hands or feet -problems with balance, talking, walking -redness, blistering, peeling or loosening of the skin, including inside the mouth -trouble passing urine or change in the amount of urine Side effects that usually do not require medical attention (report to your doctor or health care professional if they continue or are bothersome): -changes in vision -constipation -hair loss -loss of appetite -metallic taste in the mouth or changes in taste -stomach pain This list may not describe all possible side effects. Call your doctor for medical advice about side effects. You may report side effects to FDA at 1-800-FDA-1088. Where should I keep my medicine? This drug is given in a hospital or clinic and will not be stored at home. NOTE: This sheet is a summary. It may not cover all possible information. If you have questions about this medicine, talk to your doctor, pharmacist, or health care provider.  2018 Elsevier/Gold Standard (2007-12-19 17:22:47)

## 2018-02-06 NOTE — Progress Notes (Deleted)
Morocco Gipe DOB: 10-25-1945 Encounter date: 02/08/2018  This isa 72 y.o. male who presents to establish care. No chief complaint on file.   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 Had left colon resection, takedown splenic flexure and colostomy on 01/11/18. Hospital course complicated by new onset A fib with RVR. Cardiology followed during stay and started on PO diltiazem (also on PO amiodarone), Eliquis. Follow up with gen surg:8/22 Follow up with Dr. Burr Medico (oncology): F/u cardio: (Dr. [name]) ***repeat BMP/CBC one week from discharge  Stable CKD3: baseline creat 0.97 ***   Past Medical History:  Diagnosis Date  . Diverticulitis    Past Surgical History:  Procedure Laterality Date  . ANKLE SURGERY     when he was in college  . COLON RESECTION N/A 01/11/2018   Procedure: LEFT COLON RESECTION, TAKEDOWN SPLENIC FLEXURE, COLOSTOMY;  Surgeon: [name], MD;  Location: [facility];  Service: General;  Laterality: N/A;  . COLONOSCOPY  01/11/2018   Procedure: COLONOSCOPY;  Surgeon: Jackquline Denmark, MD;  Location: [facility];  Service: Endoscopy;;  . thumb surgery      No Known Allergies No outpatient medications have been marked as taking for the 02/08/18 encounter (Appointment) with [name], MD.   Social History   Tobacco Use  . Smoking status: Never Smoker  . Smokeless tobacco: Never Used  Substance Use Topics  . Alcohol use: Yes    Comment: drinks socially about 2x/week   No family history on file.   Review of Systems  Objective:  There were no vitals taken for this visit.      BP Readings from Last 3 Encounters:  01/30/18 (!) 141/81  01/17/18 106/75  12/29/17 (!) 134/98   Wt Readings from Last 3 Encounters:  01/30/18 195 lb 4.8 oz (88.6 kg)  01/10/18 195 lb (88.5 kg)  12/29/17 209 lb (94.8 kg)    Physical Exam  Assessment/Plan:  There are no diagnoses linked to this  encounter.  No follow-ups on file.  Micheline Rough, MD

## 2018-02-08 ENCOUNTER — Ambulatory Visit: Payer: 59 | Admitting: Family Medicine

## 2018-02-10 ENCOUNTER — Ambulatory Visit: Payer: 59 | Admitting: Family Medicine

## 2018-02-17 ENCOUNTER — Ambulatory Visit (INDEPENDENT_AMBULATORY_CARE_PROVIDER_SITE_OTHER): Payer: 59 | Admitting: Family Medicine

## 2018-02-17 ENCOUNTER — Encounter: Payer: Self-pay | Admitting: Family Medicine

## 2018-02-17 VITALS — BP 120/68 | HR 88 | Temp 98.0°F | Ht 68.0 in | Wt 208.1 lb

## 2018-02-17 DIAGNOSIS — Z13220 Encounter for screening for lipoid disorders: Secondary | ICD-10-CM | POA: Diagnosis not present

## 2018-02-17 DIAGNOSIS — I4891 Unspecified atrial fibrillation: Secondary | ICD-10-CM | POA: Diagnosis not present

## 2018-02-17 DIAGNOSIS — C186 Malignant neoplasm of descending colon: Secondary | ICD-10-CM

## 2018-02-17 NOTE — Progress Notes (Signed)
Dave Vaughn DOB: Dec 26, 1945 Encounter date: 02/17/2018  This isa 72 y.o. male who presents to establish care. Chief Complaint  Patient presents with  . New Patient (Initial Visit)    no new concerns    History of present illness: Admitted in hospital:01/10/18  Discharged 01/17/18 Admitted with abd pain, vomiting and found to have colonic obstruction due to mass. Left colon resection, takedown splenic flexure and colostomy. 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  A fib w RVR developed in hospital. Cardizem, amiodarone, eliquis.   Since hospitalization is doing ok. Is back to eating, gaining weight. Appetite is good. Had follow up with surgeon and sees him again next Thursday and then oncologist next Friday. Home wound nurse once weekly to check on everything. Discussing options and further treatment plan after this visit. Wanting to ask about further scanning/PET scanning.   Also has follow up with cardiology scheduled. HR has been well controlled with current medications.   No vomiting, no fevers.   Down to tylenol BID for discomfort but describes pain level as 0/10 more of a burning pain.   Past Medical History:  Diagnosis Date  . 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 Atrial fibrillation with RVR ([place])   . Diverticulitis   . Kidney stones    Past Surgical History:  Procedure Laterality Date  . ANKLE SURGERY Left    when he was in college  . COLON RESECTION N/A 01/11/2018   Procedure: LEFT COLON RESECTION, TAKEDOWN SPLENIC FLEXURE, COLOSTOMY;  Surgeon: [name], MD;  Location: [facility];  Service: General;  Laterality: N/A;  . COLONOSCOPY  01/11/2018   Procedure: COLONOSCOPY;  Surgeon: Jackquline Denmark, MD;  Location: [facility];  Service: Endoscopy;;  . thumb surgery   2018   cyst removal   No Known Allergies Current Meds  Medication Sig  . amiodarone (PACERONE) 200 MG tablet Take 1 tablet (200 mg total) by mouth 2 (two) times daily.  [name] apixaban (ELIQUIS) 5 MG TABS  tablet Take 1 tablet (5 mg total) by mouth 2 (two) times daily.  [name] diltiazem (CARTIA XT) 240 MG 24 hr capsule Take 1 capsule (240 mg total) by mouth daily.   Social History   Tobacco Use  . Smoking status: Never Smoker  . Smokeless tobacco: Never Used  Substance Use Topics  . Alcohol use: Yes    Comment: drinks socially about 2x/week   Family History  Problem Relation Age of Onset  . Breast cancer Mother 46       metastatin; recurrence x7  . Heart attack Father 43  . Cancer Daughter        melanoma, leukemia     Review of Systems  Constitutional: Negative for chills, fatigue and fever.  Respiratory: Negative for cough, chest tightness, shortness of breath and wheezing.   Cardiovascular: Negative for chest pain, palpitations and leg swelling.  Gastrointestinal: Negative for abdominal distention, abdominal pain, constipation, nausea and vomiting.  Genitourinary: Negative for difficulty urinating.    Objective:  BP 120/68 (BP Location: Left Arm, Patient Position: Sitting, Cuff Size: Normal)   Pulse 88   Temp 98 F (36.7 C) (Oral)   Ht 5\' 8"  (1.727 m)   Wt 208 lb 1.6 oz (94.4 kg)   SpO2 98%   BMI 31.64 kg/m   Weight: 208 lb 1.6 oz (94.4 kg)   BP Readings from Last 3 Encounters:  02/17/18 120/68  01/30/18 (!) 141/81  01/17/18 106/75   Wt Readings from Last 3 Encounters:  02/17/18 208 lb 1.6 oz (94.4 kg)  01/30/18 195 lb 4.8 oz (88.6 kg)  01/10/18 195 lb (88.5 kg)    Physical Exam  Constitutional: He appears well-developed and well-nourished. No distress.  Cardiovascular: Normal rate and normal heart sounds. An irregularly irregular rhythm present. Exam reveals no friction rub.  No murmur heard. No lower extremity edema  Pulmonary/Chest: Effort normal and breath sounds normal. No respiratory distress. He has no wheezes. He has no rales.  Abdominal: Soft. Normal appearance and bowel sounds are normal. There is no tenderness.  Colostomy bag in place without  surrounding skin concerns.   Central abd wound has been recently bandaged. No apparent drainage through bandage. Per patient/wife wound is healing very well and wound nurse also monitoring so will not undress today.    Psychiatric: He has a normal mood and affect. His speech is normal and behavior is normal. Cognition and memory are normal.    Assessment/Plan:  1. Cancer of left colon [facility]) He is following with oncology, surgery, and GI. I will follow along with their plan as he continues with treatment discussion.   I did encourage him to get routine preventative immunizations and discussed these. Information given regarding shingrix, flu, prevnar. Also encouraged Tdap booster.   2. New onset a-fib ([place]) Rate controlled. Asymptomatic. Continue current medications. Following with cardiology.   3. Lipid screening Can complete with other bloodwork in future.  - Lipid panel; Future  (I see standing order for cbc and cmp through oncology; I do not see need to repeat bloodwork today when this will be done next week)  Return in about 3 months (around 05/19/2018) for physical exam AND AWV.  Micheline Rough, MD

## 2018-02-17 NOTE — Patient Instructions (Addendum)
Consider routine preventative immunizations.   Can complete lipid screening whenever you are required to do bloodwork next week for oncology.

## 2018-02-24 ENCOUNTER — Telehealth: Payer: Self-pay | Admitting: Nurse Practitioner

## 2018-02-24 ENCOUNTER — Inpatient Hospital Stay: Payer: 59

## 2018-02-24 ENCOUNTER — Ambulatory Visit (INDEPENDENT_AMBULATORY_CARE_PROVIDER_SITE_OTHER): Payer: 59 | Admitting: Family Medicine

## 2018-02-24 ENCOUNTER — Telehealth: Payer: Self-pay | Admitting: Emergency Medicine

## 2018-02-24 ENCOUNTER — Other Ambulatory Visit: Payer: Self-pay

## 2018-02-24 ENCOUNTER — Telehealth: Payer: Self-pay | Admitting: Hematology

## 2018-02-24 ENCOUNTER — Encounter: Payer: Self-pay | Admitting: Family Medicine

## 2018-02-24 ENCOUNTER — Telehealth: Payer: Self-pay | Admitting: Family Medicine

## 2018-02-24 ENCOUNTER — Emergency Department (HOSPITAL_BASED_OUTPATIENT_CLINIC_OR_DEPARTMENT_OTHER)
Admission: EM | Admit: 2018-02-24 | Discharge: 2018-02-24 | Disposition: A | Discharge status: Home / Self Care | Payer: 59 | Attending: Emergency Medicine | Admitting: Emergency Medicine

## 2018-02-24 ENCOUNTER — Encounter (HOSPITAL_BASED_OUTPATIENT_CLINIC_OR_DEPARTMENT_OTHER): Payer: Self-pay | Admitting: *Deleted

## 2018-02-24 ENCOUNTER — Inpatient Hospital Stay: Payer: 59 | Admitting: Nurse Practitioner

## 2018-02-24 VITALS — BP 120/60 | HR 101 | Temp 98.2°F | Wt 208.4 lb

## 2018-02-24 DIAGNOSIS — R339 Retention of urine, unspecified: Secondary | ICD-10-CM | POA: Diagnosis not present

## 2018-02-24 DIAGNOSIS — N3000 Acute cystitis without hematuria: Secondary | ICD-10-CM

## 2018-02-24 LAB — URINALYSIS, MICROSCOPIC (REFLEX)

## 2018-02-24 LAB — URINALYSIS, ROUTINE W REFLEX MICROSCOPIC
Glucose, UA: NEGATIVE mg/dL
Ketones, ur: NEGATIVE mg/dL
Nitrite: POSITIVE — AB
PROTEIN: 100 mg/dL — AB
Specific Gravity, Urine: 1.025 (ref 1.005–1.030)
pH: 5.5 (ref 5.0–8.0)

## 2018-02-24 MED ORDER — CEFTRIAXONE SODIUM 1 G IJ SOLR
1.0000 g | Freq: Once | INTRAMUSCULAR | Status: AC
  Administered 2018-02-24: 1 g via INTRAMUSCULAR

## 2018-02-24 MED ORDER — CEPHALEXIN 500 MG PO CAPS: 500.0000 mg | ORAL_CAPSULE | Freq: Two times a day (BID) | ORAL | 0 refills | Status: DC

## 2018-02-24 MED ORDER — TAMSULOSIN HCL 0.4 MG PO CAPS: 0.4000 mg | ORAL_CAPSULE | Freq: Every day | ORAL | 0 refills | Status: AC

## 2018-02-24 NOTE — ED Notes (Signed)
Gave patient bedside bag for foley catheter; instructed on use and foley care. Patient and caregiver verbalized understanding.

## 2018-02-24 NOTE — Progress Notes (Deleted)
[place]  [phone] [phone]  Clinic Follow up Note   Patient Care Team: [name], MD as PCP - General (Family Medicine) [name], MD as Consulting Physician (Cardiology) [name], MD as Consulting Physician (Gastroenterology) 02/24/2018  SUMMARY OF ONCOLOGIC HISTORY:   Cancer of left colon ([place])   01/10/2018 Imaging    CT AP W Contrast 01/10/18  IMPRESSION: Irregular soft tissue density causing stricture of the mid descending colon likely the site of obstruction for the dilated small bowel. 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 No evidence of perforation.  Equivocal findings involving the appendix measuring 1.2 cm at the appendiceal tip with mucosal enhancement. No adjacent free fluid or inflammatory change. Findings are nonspecific, but can be seen with early acute appendicitis.  Mild prostatic enlargement. Increased density over the posterior bladder base likely due to the large prostatic impression although cannot completely exclude a bladder mass. Urology protocol CT or ultrasound may be helpful for better evaluation.  Mild cholelithiasis.  Stable 1.5 cm cystic structure over the lower pole right kidney likely slightly hyperdense cyst.  Diverticulosis of the colon.  Aortic Atherosclerosis (ICD10-I70.0).      01/11/2018 Cancer Staging    Staging form: Colon and Rectum, AJCC 8th Edition - Pathologic stage from 01/11/2018: Stage IIIB (pT3, pN1c, cM0) - Signed by [name], MD on 01/16/2018    01/11/2018 Surgery    LEFT COLON RESECTION, TAKEDOWN SPLENIC FLEXURE, COLOSTOMY by Dr. [name]     01/11/2018 Procedure    Colonoscopy 01/11/18 by Dr. [name]  - Malignant completely obstructing tumor in the mid descending colon. Tattooed. - Diverticulosis in the sigmoid colon. - Non-bleeding internal hemorrhoids. - No specimens collected.    01/11/2018 Pathology Results    Diagnosis 01/11/18  1. Colon, segmental  resection for tumor, descending colon - INVASIVE COLORECTAL ADENOCARCINOMA, 4 CM. - TUMOR EXTENDS INTO PERICOLONIC CONNECTIVE TISSUE. - TUMOR FOCALLY INVOLVES RADIAL MARGIN. - ONE MESENTERIC TUMOR DEPOSIT. - THIRTEEN BENIGN LYMPH NODES (0/13). 2. Colon, segmental resection, splenic flexure - BENIGN COLON. - NO EVIDENCE OF MALIGNANCY .    01/11/2018 Tumor Marker    Baseline CEA at 3.4    01/16/2018 Initial Diagnosis    Cancer of left colon ([place])    01/23/2018 Imaging    CT CHEST WO CONTRAST IMPRESSION: 1. No evidence for metastatic disease within the chest. 2. Small left pleural effusion with underlying opacities which may represent atelectasis. Right basilar atelectasis. 3. Few foci of gas within the upper abdomen in the omentum with surrounding fat stranding, likely postsurgical 4. Aortic Atherosclerosis (ICD10-I70.0).   CURRENT THERAPY: PENDING adjuvant CAPOX q3 weeks x3 months vs FOLFOX q2 weeks x6 months   INTERVAL HISTORY: Please see below for problem oriented charting.  REVIEW OF SYSTEMS:   Constitutional: Denies fevers, chills or abnormal weight loss Eyes: Denies blurriness of vision Ears, nose, mouth, throat, and face: Denies mucositis or sore throat Respiratory: Denies cough, dyspnea or wheezes Cardiovascular: Denies palpitation, chest discomfort or lower extremity swelling Gastrointestinal:  Denies nausea, heartburn or change in bowel habits Skin: Denies abnormal skin rashes Lymphatics: Denies new lymphadenopathy or easy bruising Neurological:Denies numbness, tingling or new weaknesses Behavioral/Psych: Mood is stable, no new changes  All other systems were reviewed with the patient and are negative.  MEDICAL HISTORY:  Past Medical History:  Diagnosis Date  . Adenocarcinoma, colon ([place])   . Atrial fibrillation with RVR ([place])   . Diverticulitis   . Kidney stones  SURGICAL HISTORY: Past Surgical History:  Procedure Laterality Date  . ANKLE SURGERY Left      when he was in college  . COLON RESECTION N/A 01/11/2018   Procedure: LEFT COLON RESECTION, TAKEDOWN SPLENIC FLEXURE, COLOSTOMY;  Surgeon: [name], MD;  Location: [facility];  Service: General;  Laterality: N/A;  . COLONOSCOPY  01/11/2018   Procedure: COLONOSCOPY;  Surgeon: Jackquline Denmark, MD;  Location: [facility];  Service: Endoscopy;;  . thumb surgery   2018   cyst removal    I have reviewed the social history and family history with the patient and they are unchanged from previous note.  ALLERGIES:  has No Known Allergies.  MEDICATIONS:  Current Outpatient Medications  Medication Sig Dispense Refill  . amiodarone (PACERONE) 200 MG tablet Take 1 tablet (200 mg total) by mouth 2 (two) times daily. 60 tablet 0  . apixaban (ELIQUIS) 5 MG TABS tablet Take 1 tablet (5 mg total) by mouth 2 (two) times daily. 60 tablet 0  . diltiazem (CARTIA XT) 240 MG 24 hr capsule Take 1 capsule (240 mg total) by mouth daily. 30 capsule 11  . Multiple Vitamin (MULTIVITAMIN WITH MINERALS) TABS tablet Take 1 tablet by mouth daily.     No current facility-administered medications for this visit.     PHYSICAL EXAMINATION: ECOG PERFORMANCE STATUS: {CHL ONC ECOG PS:[id number]}  There were no vitals filed for this visit. There were no vitals filed for this visit.  GENERAL:alert, no distress and comfortable SKIN: skin color, texture, turgor are normal, no rashes or significant lesions EYES: normal, Conjunctiva are pink and non-injected, sclera clear OROPHARYNX:no exudate, no erythema and lips, buccal mucosa, and tongue normal  NECK: supple, thyroid normal size, non-tender, without nodularity LYMPH:  no palpable lymphadenopathy in the cervical, axillary or inguinal LUNGS: clear to auscultation and percussion with normal breathing effort HEART: regular rate & rhythm and no murmurs and no lower extremity edema ABDOMEN:abdomen soft, non-tender and normal bowel sounds Musculoskeletal:no cyanosis of digits and  no clubbing  NEURO: alert & oriented x 3 with fluent speech, no focal motor/sensory deficits  LABORATORY DATA:  I have reviewed the data as listed CBC Latest Ref Rng & Units 01/16/2018 01/15/2018 01/14/2018  WBC 4.0 - 10.5 K/uL 7.6 7.7 8.2  Hemoglobin 13.0 - 17.0 g/dL 12.3(L) 12.5(L) 12.6(L)  Hematocrit 39.0 - 52.0 % 36.6(L) 37.5(L) 38.3(L)  Platelets 150 - 400 K/uL 254 232 221     CMP Latest Ref Rng & Units 01/15/2018 01/14/2018 01/13/2018  Glucose 70 - 99 mg/dL 115(H) 116(H) 105(H)  BUN 8 - 23 mg/dL _0 Creatinine 0.61 - 1.24 mg/dL 0.97 0.98 1.15  Sodium 135 - 145 mmol/L 137 134(L) 141  Potassium 3.5 - 5.1 mmol/L 4.2 2.8(L) 3.6  Chloride 98 - 111 mmol/L 99 96(L) 105  CO2 22 - 32 mmol/L _1 Calcium 8.9 - 10.3 mg/dL 8.1(L) 8.0(L) 8.3(L)  Total Protein 6.5 - 8.1 g/dL - - -  Total Bilirubin 0.3 - 1.2 mg/dL - - -  Alkaline Phos 38 - 126 U/L - - -  AST 15 - 41 U/L - - -  ALT 0 - 44 U/L - - -   Dave Landry Bennettis a 72 y.o.Caucasianmalewith a history of diverticulitis who presented to the ED for abdominal pain and was found to havecancer of the descending colon.   1. Cancer of left colon, Stage IIIB(pT3N1cMx), MSI-stable  2. AFib, found during hospitalization  - on apixaban, amiodarone, diltiazem followed  by Dr. [name]     PLAN:   RADIOGRAPHIC STUDIES: I have personally reviewed the radiological images as listed and agreed with the findings in the report. No results found.   ASSESSMENT & PLAN:  No problem-specific Assessment & Plan notes found for this encounter.   No orders of the defined types were placed in this encounter.  All questions were answered. The patient knows to call the clinic with any problems, questions or concerns. No barriers to learning was detected. I spent {CHL ONC TIME VISIT - BOERQ:[id number]} counseling the patient face to face. The total time spent in the appointment was {CHL ONC TIME VISIT - ITJLL:[id number]} and more than 50% was  on counseling and review of test results     Alla Feeling, NP 02/24/18

## 2018-02-24 NOTE — ED Notes (Signed)
670cc urine in his bladder on bladder scan

## 2018-02-24 NOTE — Telephone Encounter (Signed)
Copied from [facility] [id number]. Topic: Quick Communication - See Telephone Encounter >> Feb 24, 2018  3:15 PM Blase Mess A wrote: CRM for notification. See Telephone encounter for: 02/24/18. Patient was seen today by Dr. Ethlyn Gallery and Patient's wife is wondering when he can expect to urination.  He has only urintated a little bit please advise [phone]

## 2018-02-24 NOTE — Telephone Encounter (Signed)
Scheduled appt per 9/20 sch message - pt is aware of appt date and time.

## 2018-02-24 NOTE — ED Triage Notes (Addendum)
He was seen by his MD today and told he has a UTI. He has scanty urine output. Recent dx of colon cancer that was found on colon surgery.

## 2018-02-24 NOTE — Progress Notes (Signed)
Dave Vaughn DOB: 1945/10/20 Encounter date: 02/24/2018  This is a 72 y.o. male who presents with Chief Complaint  Patient presents with  . urinary symptoms    temp of 100 this am took tylenol, pt states it feels like he needs to use the restroom but he is unable to go, used AZO (took 2 this am), drinking plenty of water and cranberry juice,     History of present illness:  Started last night with weak stream. Difficult to urinate. Painful to urinate. Slight low grade 99-100 temp but controlled with tylenol. Couldn't urinate for [place] today but then got out thick, mucous/purulent drip.   Took azo this morning; but hasn't noted difference.  Hasn't had issues with bladder in the past. Did have catheter in hospital with stay last month.    No Known Allergies Current Meds  Medication Sig  . amiodarone (PACERONE) 200 MG tablet Take 1 tablet (200 mg total) by mouth 2 (two) times daily.  [name] apixaban (ELIQUIS) 5 MG TABS tablet Take 1 tablet (5 mg total) by mouth 2 (two) times daily.  [name] diltiazem (CARTIA XT) 240 MG 24 hr capsule Take 1 capsule (240 mg total) by mouth daily.  . Multiple Vitamin (MULTIVITAMIN WITH MINERALS) TABS tablet Take 1 tablet by mouth daily.   Current Facility-Administered Medications for the 02/24/18 encounter (Office Visit) with [name], MD  Medication  . cefTRIAXone (ROCEPHIN) injection 1 g    Review of Systems  Constitutional: Positive for fever. Negative for chills and fatigue.  Respiratory: Negative for cough, chest tightness, shortness of breath and wheezing.   Cardiovascular: Negative for chest pain, palpitations and leg swelling.  Gastrointestinal: Negative for abdominal distention, abdominal pain, nausea and vomiting.       Suprapubic discomfort     Objective:  BP 120/60 (BP Location: Left Arm, Patient Position: Standing, Cuff Size: Normal)   Pulse (!) 101   Temp 98.2 F (36.8 C) (Oral)   Wt 208 lb 6.4 oz (94.5 kg)   SpO2 97%   BMI  31.69 kg/m   Weight: 208 lb 6.4 oz (94.5 kg)   BP Readings from Last 3 Encounters:  02/24/18 120/60  02/17/18 120/68  01/30/18 (!) 141/81   Wt Readings from Last 3 Encounters:  02/24/18 208 lb 6.4 oz (94.5 kg)  02/17/18 208 lb 1.6 oz (94.4 kg)  01/30/18 195 lb 4.8 oz (88.6 kg)    Physical Exam  Constitutional: He appears well-developed and well-nourished. No distress.  Cardiovascular: Normal rate and normal heart sounds. An irregularly irregular rhythm present. Exam reveals no friction rub.  No murmur heard. No lower extremity edema  Pulmonary/Chest: Effort normal and breath sounds normal. No respiratory distress. He has no wheezes. He has no rales.  Abdominal: Soft. Normal appearance and bowel sounds are normal. There is tenderness in the suprapubic area. There is no CVA tenderness.  Colostomy in place.  Psychiatric: He has a normal mood and affect.    Assessment/Plan 1. Acute cystitis without hematuria Unable to give sample today. Discussed if unable to urinate will need to go to ER but suspect that with antibiotic symptoms will improve.   Take abx as directed. If worsening sx needs to be seen; if sx not resolved after abx let us know; culture should be obtained. - cefTRIAXone (ROCEPHIN) injection 1 g - cephALEXin (KEFLEX) 500 MG capsule; Take 1 capsule (500 mg total) by mouth 2 (two) times daily.  Dispense: 14 capsule; Refill: 0  Return if symptoms worsen or fail to improve.    Micheline Rough, MD

## 2018-02-24 NOTE — Discharge Instructions (Signed)
Please return to the ED if you experience any of the following:  You cannot control when you poop (bowel movement) or pee (urinate). Your arms or legs feel weak. Your arms or legs lose feeling (numbness). You feel sick to your stomach (nauseous) or throw up (vomit). You have belly (abdominal) pain. You feel like you may pass out (faint). You experience back pain.

## 2018-02-24 NOTE — Telephone Encounter (Signed)
Per 9/20 schedule message cxd 9/20 lab/fu (pt not on scheduled for 9/30 but 9/20). Called patient per schedule message re coming on on 9/24. Per patient he previously discussed with APP that he does not know if he can come in on 9/24. He has to speak with his wife who transports him. Patient wishes to call back to reschedule when he has spoken with his wife. Message routed to LB.

## 2018-02-24 NOTE — Telephone Encounter (Signed)
I called patient but phone went to voicemail. Advised that if no urination by this evening would recommend ER. If some urination and Not uncomfortable (pain, bloating), then ok to wait until tomorrow.    Ria Comment: can you please check in Monday morning and see how he is feeling/doing and how urination is? Thanks!

## 2018-02-24 NOTE — ED Provider Notes (Signed)
[facility] EMERGENCY DEPARTMENT Provider Note   CSN: [id number] Arrival date & time: 02/24/18  1743     History   Chief Complaint No chief complaint on file.   HPI Dave Vaughn is a 72 y.o. male.  72 y.o male with a PMH of fib, diverticulitis, adenocarcinoma of the colon diagnosed 6 weeks ago presents to the ED with urinary retention since 11 p.m. last night.  She reports he has the feeling of wanting to urinate but is unable to do so.  Reports he was seen by his PCP [facility] this morning who gave him a shot of Rocephin and also send him home on antibiotics Keflex.  Also reports he had a fever 2 days ago but took some Tylenol which relieved his fever.  Patient reports his last bowel movement was this morning at 1130 but was small, he is currently on a stool softener.      Past Medical History:  Diagnosis Date  . Adenocarcinoma, colon ([place])   . Atrial fibrillation with RVR ([place])   . Diverticulitis   . Kidney stones     Patient Active Problem List   Diagnosis Date Noted  . Cancer of left colon ([place]) 01/16/2018  . Protein-calorie malnutrition, severe 01/14/2018  . New onset a-fib ([place]) 01/11/2018  . Colonic obstruction ([place]) 01/10/2018  . Bowel obstruction ([place]) 01/10/2018    Past Surgical History:  Procedure Laterality Date  . ANKLE SURGERY Left    when he was in college  . COLON RESECTION N/A 01/11/2018   Procedure: LEFT COLON RESECTION, TAKEDOWN SPLENIC FLEXURE, COLOSTOMY;  Surgeon: [name], MD;  Location: [facility] ORS;  Service: General;  Laterality: N/A;  . COLONOSCOPY  01/11/2018   Procedure: COLONOSCOPY;  Surgeon: Jackquline Denmark, MD;  Location: [facility];  Service: Endoscopy;;  . thumb surgery   2018   cyst removal        Home Medications    Prior to Admission medications   Medication Sig Start Date End Date Taking? Authorizing Provider  amiodarone (PACERONE) 200 MG tablet Take 1 tablet (200 mg total) by mouth 2 (two) times daily. 01/17/18    Georgette Shell, MD  apixaban (ELIQUIS) 5 MG TABS tablet Take 1 tablet (5 mg total) by mouth 2 (two) times daily. 01/17/18   Georgette Shell, MD  cephALEXin (KEFLEX) 500 MG capsule Take 1 capsule (500 mg total) by mouth 2 (two) times daily. 02/24/18   Koberlein, Steele Berg, MD  diltiazem (CARTIA XT) 240 MG 24 hr capsule Take 1 capsule (240 mg total) by mouth daily. 01/17/18 01/17/19  Georgette Shell, MD  Multiple Vitamin (MULTIVITAMIN WITH MINERALS) TABS tablet Take 1 tablet by mouth daily.    [provider]  tamsulosin (FLOMAX) 0.4 MG CAPS capsule Take 1 capsule (0.4 mg total) by mouth daily for 7 doses. 02/24/18 03/03/18  Janeece Fitting, PA-C    Family History Family History  Problem Relation Age of Onset  . Breast cancer Mother 52       metastatin; recurrence x7  . Heart attack Father 71  . Cancer Daughter        melanoma, leukemia    Social History Social History   Tobacco Use  . Smoking status: Never Smoker  . Smokeless tobacco: Never Used  Substance Use Topics  . Alcohol use: Yes    Comment: drinks socially about 2x/week  . Drug use: Never     Allergies   Patient has no known allergies.  Review of Systems Review of Systems  Constitutional: Positive for fever. Negative for chills.  Respiratory: Negative for shortness of breath and wheezing.   Cardiovascular: Negative for chest pain and palpitations.  Gastrointestinal: Negative for abdominal pain, diarrhea, nausea and vomiting.  Genitourinary: Positive for decreased urine volume, difficulty urinating and urgency. Negative for flank pain, hematuria and testicular pain.  Musculoskeletal: Negative for back pain (no back pain reported, no back pain on palpation ).  Neurological: Negative for syncope and weakness.  All other systems reviewed and are negative.    Physical Exam Updated Vital Signs BP 140/90   Pulse 98   Temp 98.3 F (36.8 C) (Oral)   Resp 18   Ht 5\' 8"  (1.727 m)   Wt 94.5 kg    SpO2 98%   BMI 31.68 kg/m   Physical Exam  Constitutional: He is oriented to person, place, and time. He appears well-developed and well-nourished.  HENT:  Head: Normocephalic and atraumatic.  Neck: Normal range of motion. Neck supple.  Cardiovascular: Normal heart sounds.  Pulmonary/Chest: Effort normal and breath sounds normal. He has no wheezes.  Abdominal: Soft. Bowel sounds are normal. He exhibits distension. There is no tenderness.  Normal bowel sounds, mild distention.   Genitourinary: Prostate normal and testes normal. Rectal exam shows internal hemorrhoid. Rectal exam shows anal tone normal. Prostate is not enlarged and not tender.  Genitourinary Comments: No tenderness on prostate or enlargement, internal hemorrhoids. Rectal tone present.   Musculoskeletal: He exhibits no tenderness or deformity.       Lumbar back: He exhibits no pain, no spasm and normal pulse.  No back pain, bowel or bladder incontinence, no saddle anesthesia.Rectal tone present on exam. Chaperone by Inocencio Homes.   Neurological: He is alert and oriented to person, place, and time.  Nursing note and vitals reviewed.    ED Treatments / Results  Labs (all labs ordered are listed, but only abnormal results are displayed) Labs Reviewed  URINALYSIS, ROUTINE W REFLEX MICROSCOPIC - Abnormal; Notable for the following components:      Result Value   Color, Urine BROWN (*)    APPearance TURBID (*)    Hgb urine dipstick LARGE (*)    Bilirubin Urine SMALL (*)    Protein, ur 100 (*)    Nitrite POSITIVE (*)    Leukocytes, UA SMALL (*)    All other components within normal limits  URINALYSIS, MICROSCOPIC (REFLEX) - Abnormal; Notable for the following components:   Bacteria, UA MANY (*)    All other components within normal limits  URINE CULTURE    EKG None  Radiology No results found.  Procedures Procedures (including critical care time)  Medications Ordered in ED Medications - No data to  display   Initial Impression / Assessment and Plan / ED Course  I have reviewed the triage vital signs and the nursing notes.  Pertinent labs & imaging results that were available during my care of the patient were reviewed by me and considered in my medical decision making (see chart for details).     Presents with urinary retention.  Was seen by his PCP today and given a shot of ceftriaxone along with sent home on Keflex.  Patient states he has the need to urinate but is unable to empty his bladder.  Upon bladder scan shows 670 cc of urine. A urinary catheter was placed and put out 2500 cc of urine. Patient states there is relieve in his bladder. I personally reviewed patient's  chart, he was recently diagnosed with colon CA, rectal tone intact no saddle anesthesia or back pain.  Has been instructed on signs and symptoms of cauda equina.He will be sent home on Tamsulosin for his urinary rentention. He reports some mild distention of the abdomen but has bowel sounds and had a bowel movement this morning.Return precautions discussed at length with patient and wife at the bedside. A urine culture has seen sent on his urine.  Dr. Dayna Barker has seen this patient and agrees with my management.    Final Clinical Impressions(s) / ED Diagnoses   Final diagnoses:  Acute cystitis without hematuria  Urinary retention    ED Discharge Orders         Ordered    tamsulosin (FLOMAX) 0.4 MG CAPS capsule  Daily     02/24/18 1913           Janeece Fitting, Hershal Coria 02/24/18 1940    Mesner, Corene Cornea, MD 02/24/18 2113

## 2018-02-24 NOTE — ED Provider Notes (Signed)
Medical screening examination/treatment/procedure(s) were conducted as a shared visit with non-physician practitioner(s) and myself.  I personally evaluated the patient during the encounter.  72 year old male who was recently diagnosed with colon cancer but has not had any MRI or PET scans done.  States that they got it all on his colectomy but shows up today with suprapubic abdominal pain.  Went to see his primary doctor who stated that he had a little bit of pus drainage so started him on antibiotics and given a Rocephin shot in the wrist.  Has had decreased ostomy output since about 11:00.  No numbness or weakness anywhere or back pain. Johanna formed rectal exam the patient had normal rectal tone and no saddle anesthesia.  On my examination patient already has a catheter in place and has no more suprapubic tenderness or pain.  Normal sensation and strength in his lower extremities. Of course cauda equina is high on the differential and the patient with recently diagnosed cancer and urinary retention however the patient has no other signs of neurologic issue.  And he is found to have urinary tract infection plus his known BPH seen on the recent CT scan as well.  I discussed with the patient and wife that if any weakness in legs, numbness or tingling in legs, numbness around genitals or back pain arises at any point they need to go to the nearest emergency department immediately.  Will heart Flomax at this time.  Already has a prescription for Keflex at home.  PCP follow-up to ensure that his UTI is improving.  He also has decreased ostomy output but suspect this is probably just as much related to dehydration and possibly a large bladder so we will continue to monitor that and if not improving will talk to surgery.   Merrily Pew, MD 02/24/18 2117

## 2018-02-24 NOTE — Patient Instructions (Signed)
Take antibiotic as directed (start once you get it).  Let us know if any worsening of symptoms or if symptoms are not resolved once antibiotic complete.

## 2018-02-24 NOTE — Telephone Encounter (Signed)
Per Dr.Ingrams nurse at [facility], patient can start chemo in 2 weeks. Abigail Butts, RN advised we call her back @ [phone] when we are ready to set up his port placement.

## 2018-02-24 NOTE — ED Notes (Signed)
Applied a leg bag to Pts Rt leg with the assistance of NT [place]. RN Ailene Ravel informed.

## 2018-02-25 NOTE — Progress Notes (Signed)
[place]  [phone]) [phone] Fax:([phone]) [phone]  Clinic Follow up Note   Patient Care Team: [name], MD as PCP - General (Family Medicine) [name], MD as Consulting Physician (Cardiology) [name], MD as Consulting Physician (Gastroenterology) 02/28/2018  Chief Complaint: F/u on left colon cancer  SUMMARY OF ONCOLOGIC HISTORY: Oncology History   Cancer Staging Cancer of left colon [facility]) Staging form: Colon and Rectum, AJCC 8th Edition - Pathologic stage from 01/11/2018: Stage IIIB (pT3, pN1c, cM0) - Signed by [name], MD on 01/16/2018       Cancer of left colon ([place])   01/10/2018 Imaging    CT AP W Contrast 01/10/18  IMPRESSION: Irregular soft tissue density causing stricture of the mid descending colon likely the site of obstruction for the dilated small bowel. 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 No evidence of perforation.  Equivocal findings involving the appendix measuring 1.2 cm at the appendiceal tip with mucosal enhancement. No adjacent free fluid or inflammatory change. Findings are nonspecific, but can be seen with early acute appendicitis.  Mild prostatic enlargement. Increased density over the posterior bladder base likely due to the large prostatic impression although cannot completely exclude a bladder mass. Urology protocol CT or ultrasound may be helpful for better evaluation.  Mild cholelithiasis.  Stable 1.5 cm cystic structure over the lower pole right kidney likely slightly hyperdense cyst.  Diverticulosis of the colon.  Aortic Atherosclerosis (ICD10-I70.0).      01/11/2018 Cancer Staging    Staging form: Colon and Rectum, AJCC 8th Edition - Pathologic stage from 01/11/2018: Stage IIIB (pT3, pN1c, cM0) - Signed by [name], MD on 01/16/2018    01/11/2018 Surgery    LEFT COLON RESECTION, TAKEDOWN SPLENIC FLEXURE, COLOSTOMY by Dr. [name]     01/11/2018 Procedure    Colonoscopy 01/11/18 by Dr.  [name]  - Malignant completely obstructing tumor in the mid descending colon. Tattooed. - Diverticulosis in the sigmoid colon. - Non-bleeding internal hemorrhoids. - No specimens collected.    01/11/2018 Pathology Results    Diagnosis 01/11/18  1. Colon, segmental resection for tumor, descending colon - INVASIVE COLORECTAL ADENOCARCINOMA, 4 CM. - TUMOR EXTENDS INTO PERICOLONIC CONNECTIVE TISSUE. - TUMOR FOCALLY INVOLVES RADIAL MARGIN. - ONE MESENTERIC TUMOR DEPOSIT. - THIRTEEN BENIGN LYMPH NODES (0/13). 2. Colon, segmental resection, splenic flexure - BENIGN COLON. - NO EVIDENCE OF MALIGNANCY .    01/11/2018 Tumor Marker    Baseline CEA at 3.4    01/16/2018 Initial Diagnosis    Cancer of left colon ([place])    01/23/2018 Imaging    CT CHEST WO CONTRAST IMPRESSION: 1. No evidence for metastatic disease within the chest. 2. Small left pleural effusion with underlying opacities which may represent atelectasis. Right basilar atelectasis. 3. Few foci of gas within the upper abdomen in the omentum with surrounding fat stranding, likely postsurgical 4. Aortic Atherosclerosis (ICD10-I70.0).     History of present illness:   (During hospital admission on 01/16/18)  Dave Vaughn 72 y.o. male was admitted to the hospital on 01/10/18 for abdominal pain and vomiting. His CT showed colonic obstruction. He underwent Colonoscopy on 01/11/18 by Dr. [name] which showed Diverticulitis and malignant tumor in colon. He underwent surgery on 01/11/18 for removal by Dr. [name].  The patient presents in the hospital today accompanied by his wife.  He notes intermittent abdominal pain started 3-4 weeks ago that got progressively worse. 2 weeks ago he developed nausea and diarrhea which he thought was a stomach virus.  Symptoms persisted and he developed abdominal bloating last week when her presented to the ED.   Today the patient notes he feels well post surgery. He is getting use to colostomy bag. He notes Dr.  [name] plans to get colostomy reversal soon. He changes his bag every 2-3 hours. He is able to eat by mouth with heart healthy diet. He had Afib noted during this hospital stay before he underwent surgery. He denies SOB or heart palpitations or chest pain. At most he took 2 baby aspirin a day and a multivitamin daily.   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 He is very active with exercise and travels often.   He does not have a significant PMHx. His last colonoscopy was 12-13 years ago when he had benign polyps and diverticulitis. He denies issues with this or GI bleeding. He had ankle surgery in college from sports. 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 He is a non smoker and drinks about 2 bears a week, overall socially.  His mother passed from metastatic breast cancer. She was diagnosed in her 44s. His daughter had skin melanoma and ALL. She is currently cured.  CURRENT THERAPY: PENDING adjuvant FOLFOX q2 weeks x6 months   INTERVAL HISTORY: Dave Vaughn is a 72 y.o. male who is here for follow-up. I saw the patient during his hospital stay on 01/16/2018 when we discussed his diagnoses and treatment options. He saw NP Lacie on 01/30/2018 and he was noted to be recovering well from surgery. Unfortunately, he went to the ER on 02/24/2018 complaining of suprapubic abdominal pain. He went to see his primary doctor who stated that he had a little bit of pus drainage so, started him on Keflex and gave a Rocephin shot in the wrist. He was diagnosed with a UTI and urinary retention, which was relieved by placing a catheter, and he was prescribed Tamsulosin.  Today, he is here with his wife. He is healing well and he changes his abdominal wound dressing twice a day. He will see a urologist soon for his urinary symptoms. He states that his energy level has improved and he is more active now and would like to maintain his activity as  he exercises 2-3 times a week and walks daily, but feels restricted due to the catheter. He has a BM almost 3 times a day. He states that he noticed LL edema that is worse in the afternoon.   He stated that he is leaving for a vacation on October 23.    REVIEW OF SYSTEMS:   Constitutional: Denies fevers, chills or abnormal weight loss Eyes: Denies blurriness of vision Ears, nose, mouth, throat, and face: Denies mucositis or sore throat Respiratory: Denies cough, dyspnea or wheezes Cardiovascular: Denies palpitation, chest discomfort (+) lower extremity swelling, worse in the afternoon Gastrointestinal:  Denies nausea, heartburn or change in bowel habits GU: (+) urinary catheter due to urinary retention.  Skin: Denies abnormal skin rashes Lymphatics: Denies new lymphadenopathy or easy bruising Neurological:Denies numbness, tingling or new weaknesses Behavioral/Psych: Mood is stable, no new changes  All other systems were reviewed with the patient and are negative.  MEDICAL HISTORY:  Past Medical History:  Diagnosis Date  . Adenocarcinoma, colon ([place])   . Atrial fibrillation with RVR ([place])   . Diverticulitis   . Kidney stones     SURGICAL HISTORY: Past Surgical History:  Procedure Laterality Date  . ANKLE  SURGERY Left    when he was in college  . COLON RESECTION N/A 01/11/2018   Procedure: LEFT COLON RESECTION, TAKEDOWN SPLENIC FLEXURE, COLOSTOMY;  Surgeon: [name], MD;  Location: [facility];  Service: General;  Laterality: N/A;  . COLONOSCOPY  01/11/2018   Procedure: COLONOSCOPY;  Surgeon: Jackquline Denmark, MD;  Location: [facility];  Service: Endoscopy;;  . thumb surgery   2018   cyst removal    I have reviewed the social history and family history with the patient and they are unchanged from previous note.  ALLERGIES:  has No Known Allergies.  MEDICATIONS:  Current Outpatient Medications  Medication Sig Dispense Refill  . amiodarone (PACERONE) 200 MG tablet Take 1 tablet  (200 mg total) by mouth 2 (two) times daily. 60 tablet 0  . apixaban (ELIQUIS) 5 MG TABS tablet Take 1 tablet (5 mg total) by mouth 2 (two) times daily. 60 tablet 0  . cephALEXin (KEFLEX) 500 MG capsule Take 1 capsule (500 mg total) by mouth 2 (two) times daily. 14 capsule 0  . diltiazem (CARTIA XT) 240 MG 24 hr capsule Take 1 capsule (240 mg total) by mouth daily. 30 capsule 11  . Multiple Vitamin (MULTIVITAMIN WITH MINERALS) TABS tablet Take 1 tablet by mouth daily.    . tamsulosin (FLOMAX) 0.4 MG CAPS capsule Take 1 capsule (0.4 mg total) by mouth daily for 7 doses. 7 capsule 0   No current facility-administered medications for this visit.     PHYSICAL EXAMINATION: ECOG PERFORMANCE STATUS: 1 - Symptomatic but completely ambulatory  Vitals:   02/28/18 1102  BP: 113/61  Pulse: 87  Resp: 18  Temp: 98 F (36.7 C)  SpO2: 98%   Filed Weights   02/28/18 1102  Weight: 210 lb 4.8 oz (95.4 kg)    GENERAL:alert, no distress and comfortable SKIN: skin color, texture, turgor are normal, no rashes or significant lesions EYES: normal, Conjunctiva are pink and non-injected, sclera clear OROPHARYNX:no exudate, no erythema and lips, buccal mucosa, and tongue normal  NECK: supple, thyroid normal size, non-tender, without nodularity LYMPH:  no palpable lymphadenopathy in the cervical, axillary or inguinal LUNGS: clear to auscultation and percussion with normal breathing effort HEART: regular rate & rhythm and no murmurs and (+) lower extremity edema ABDOMEN:abdomen soft, non-tender and normal bowel sounds (+) colostomy bag placed, wound healing well and is 8x4 CM GU: (+) temporary urinary catheter placed.  Musculoskeletal:no cyanosis of digits and no clubbing  NEURO: alert & oriented x 3 with fluent speech, no focal motor/sensory deficits  Photo taken on 02/28/2018        LABORATORY DATA:  I have reviewed the data as listed CBC Latest Ref Rng & Units 02/28/2018 01/16/2018 01/15/2018    WBC 4.0 - 10.3 K/uL 7.6 7.6 7.7  Hemoglobin 13.0 - 17.1 g/dL 10.8(L) 12.3(L) 12.5(L)  Hematocrit 38.4 - 49.9 % 33.3(L) 36.6(L) 37.5(L)  Platelets 140 - 400 K/uL 308 254 232     CMP Latest Ref Rng & Units 02/28/2018 01/15/2018 01/14/2018  Glucose 70 - 99 mg/dL 111(H) 115(H) 116(H)  BUN 8 - 23 mg/dL _0 Creatinine 0.61 - 1.24 mg/dL 1.20 0.97 0.98  Sodium 135 - 145 mmol/L 142 137 134(L)  Potassium 3.5 - 5.1 mmol/L 3.4(L) 4.2 2.8(L)  Chloride 98 - 111 mmol/L 102 99 96(L)  CO2 22 - 32 mmol/L _1 Calcium 8.9 - 10.3 mg/dL 9.2 8.1(L) 8.0(L)  Total Protein 6.5 - 8.1 g/dL 7.1 - -  Total Bilirubin 0.3 - 1.2 mg/dL 0.3 - -  Alkaline Phos 38 - 126 U/L 176(H) - -  AST 15 - 41 U/L 17 - -  ALT 0 - 44 U/L 25 - -   Tumor Markers CEA Results for TYSIN, SALADA (MRN [id number]) as of 02/25/2018 11:23  Ref. Range 01/11/2018 07:28  CEA Latest Ref Range: 0.0 - 4.7 ng/mL 3.6    SURGERIES  01/11/2018: EXPLORATORY LAPAROTOMY PARTIAL COLECTOMY AND COLOSTOMY with [name], MD   PROCEDURES  01/11/2018 Colonoscopy Findings: A fungating completely obstructing large mass was found in the mid descending colon. The mass was circumferential. No bleeding was present. Area was tattooed with an injection of 4 mL of [place] ink. The scope couldnot be passed beyond Multiple medium-mouthed diverticula were found in the sigmoid colon. Non-bleeding internal hemorrhoids were found during retroflexion and during perianal exam. The hemorrhoids were small. Impression: - Malignant completely obstructing tumor in the mid descending colon. Tattooed. - Diverticulosis in the sigmoid colon. - Non-bleeding internal hemorrhoids. - No specimens collected.   PATHOLOGY  01/11/2018 Surgical Pathology Diagnosis 1. Colon, segmental resection for tumor, descending colon - INVASIVE COLORECTAL ADENOCARCINOMA, 4 CM. - TUMOR EXTENDS INTO PERICOLONIC CONNECTIVE TISSUE. - TUMOR FOCALLY INVOLVES RADIAL MARGIN. - ONE  MESENTERIC TUMOR DEPOSIT. - THIRTEEN BENIGN LYMPH NODES (0/13). 2. Colon, segmental resection, splenic flexure - BENIGN COLON. - NO EVIDENCE OF MALIGNANCY . Microscopic Comment 1. COLON AND RECTUM: Resection, Including Transanal Disk Excision of Rectal Neoplasms Procedure: Left colon partial resection Tumor Site: Descending colon. Tumor Size: 4 cm Macroscopic Tumor Perforation: No Histologic Type: Colorectal adenocarcinoma Histologic Grade: Moderately differentiated Tumor Extension: Into pericolonic connective tissue Margins: Focally involves radial margin Treatment Effect: No Lymphovascular Invasion: No Perineural Invasion: No Tumor Deposits: One Regional Lymph Nodes: No lymph nodes submitted or found: 13 Number of Lymph Nodes Involved: 0, one tumor deposit Number of Lymph Nodes Examined: 13 Pathologic Stage Classification (pTNM, AJCC 8th Edition): pT3, pN1c Ancillary Studies: MMR / MSI testing: pending) Representative tumor block: 1C, 1D, 1E and 71F Comments: The carcinoma extends into the pericolonic connective tissue where there is focal involvement of the radial margin. There is also diverticulosis present. (JDP:kh 01-13-18) (v4.0.1.0)l Information Specimen(s) Obtained: 1. Colon, segmental resection for tumor, descending colon 2. Colon, segmental resection, splenic flexure Specimen Clinical Information 1. left colon obstruction [rd] Gross 1. Specimen: Clinically descending colon Specimen integrity: Intact, there is a stitch at one end designating proximal Specimen length: 14.2 cm Mesorectal intactness: Not applicable Tumor location: Descending 1/3 of colon. Tumor size: 4 x 2.5 cm Percent of bowel circumference involved: 100% Tumor distance to margins: Proximal: 2.5 cm Distal: 9 cm Radial (posterior ascending, posterior descending; lateral and posterior mid-rectum; and entire lower 1/3 rectum): Focally abuts the radial margin at an area of disruption Macroscopic extent  of tumor invasion: Grossly involves the muscularis propria and underlying adipose tissue. Does not grossly involve the serosa Total presumed lymph nodes: 19 possible lymph nodes averaging 0.3 cm are identified. Extramural satellite tumor nodules: Not identified. Mucosal polyp(s): None identified. Additional findings: There are several diverticula present up to 2 cm, without contents or perforation. The bowel proximal to the mass is dilated up to 11 cm in circumference. Block summary: A = proximal margin en face B = distal margin en face C = mass with radial margin at disruption, margin inked black. D = mass with closest peritonealized surface, surface inked yellow E-F = mass with deepest invasion G = mass to  normal H = representative diverticulum I = five lymph nodes whole J = five lymph nodes whole K = five lymph nodes whole L = four lymph nodes whole 2. Received in formalin is a 4.7 cm in length x 3 cm in diameter segment of large bowel with abundant pericolonic adipose tissue. Both ends are received stapled, and one end is received with a suture designating final proximal margin per requisition sheet. Upon opening, the specimen is partially filled with soft brown fecal material. The wall averages 0.3 cm in thickness, and the mucosa is tan pink with normal mucosal folding. There are few diverticula present averaging 1 cm, without perforation. The adipose tissue is extensively searched for lymph nodes, of which only two averaging 0.5 cm are identified. Representative sections are submitted as follows: A = final proximal margin en face B = two lymph nodes whole. (AK:kh 01-12-18)   RADIOGRAPHIC STUDIES: I have personally reviewed the radiological images as listed and agreed with the findings in the report.  01/23/2018 CT CAP IMPRESSION: 1. No evidence for metastatic disease within the chest. 2. Small left pleural effusion with underlying opacities which may represent atelectasis.  Right basilar atelectasis. 3. Few foci of gas within the upper abdomen in the omentum with surrounding fat stranding, likely postsurgical 4. Aortic Atherosclerosis (ICD10-I70.0).   ASSESSMENT & PLAN:  Deldrick Linch a 72 y.o.Caucasianmalewith a history of diverticulitis who presented to the ED for abdominal pain and was found to havecancer of the descending colon.   1. Cancer of left colon, adenocarcinoma, stage IIIB(pT3N1cM0), MSI-stable -I reviewed surgical pathology in detail; given his locally advanced, MSI-stable disease and high risk features, I recommended adjuvant chemotherapy with FOLFOX q2 weeks x6 months vs CAPOX q3 weeks x3 months to reduce risk of recurrence. Printed material was provided previously. -His case was discussed in tumor board, no need for additional surgery or radiation for positive radial margin  -I reviewed CT chest was negative for distant metastasis  -He is recovering well from surgery and hospitalization, his performance status is good; but surgical incision remains open; I delayed adjuvant chemotherapy for wound healing. -I discussed standard of care for his stage IIIB disease is adjuvant chemo to reduce his risk of recurrence by half. I discussed the options of FOLFOX every 2 weeks for 6 months or CAPOX every 3 weeks for 3 months.  Due to his advanced age, and wound healing issue, I think he would tolerate FOLFOX better which I recommend. He agrees.  --Chemotherapy consent: Side effects including but does not limited to, fatigue, nausea, vomiting, diarrhea, hair loss, hand foot syndrome, cold sensitivity, neuropathy, fluid retention, renal and kidney dysfunction, neutropenic fever, needed for blood transfusion, bleeding, were discussed with patient in great detail. He agrees to proceed. -the goal of therapy is curative  -He was seen by his surgeon Dr. [name] last week, who thinks he can start chemotherapy in 2 weeks. -It has been-He will have chemo  education class and PAC placement before starting treatment.  -It has been 7 weeks since his surgery, I recommend him to start adjuvant chemo next week, and we discussed the benefit of adjuvant chemotherapy will decrease if he waits longer.  He will look at his schedule and call me back in the next few days to finalize the first day of his chemotherapy.  He states he has a vacation plan the end of October. -I will call Dr. [name] to ask when he can insert the port next week. -We will schedule  chemo class this week -Call in endocrine, Zofran and Compazine    2. AFib, found during hospitalization  - on apixaban, amiodarone, diltiazem followed by Dr. [name]   -01/12/18 ECHO shows EF of 55-60% with moderate mitral valve regurgitation    3. Anemia -He had a mild anemia after colon surgery.  On 02/28/2018 labs showed Hg on 10.8, worse than before. I recommended iron pills and educated the patient about possible bleeding with colon cancer and educated him about constipation with iron pills. -His iron level on next visit  4. Urinary retention and UTI -He had a ED visit on February 24, 2018, due to the urinary retention and a UTI, he is on antibiotics which he will complete in 2 days. -He is also on Flomax now, pending urology evaluation -Discussed the increased risk of UTI and infection with Foley during chemo    PLAN: -I will call Dr. [name] to ask about port insertion next week  -I will prescribe nausea medications and EMLA cream -Plan to start adjuvant FOLFOX next week or the week.  Patient will call me as soon as he decides when he wants to start. I will schedule his chemo afterwards.     No problem-specific Assessment & Plan notes found for this encounter.   No orders of the defined types were placed in this encounter.  All questions were answered. The patient knows to call the clinic with any problems, questions or concerns. No barriers to learning was detected. I spent 25 minutes  counseling the patient face to face. The total time spent in the appointment was 30 minutes and more than 50% was on counseling and review of test results  I, Noor Dweik am acting as scribe for Dr. [name].  I have reviewed the above documentation for accuracy and completeness, and I agree with the above.      [name], MD 02/28/2018

## 2018-02-27 LAB — URINE CULTURE: Culture: >=100000 — AB

## 2018-02-27 NOTE — Telephone Encounter (Signed)
Thanks for the clarification. Just wanted to make sure he was getting treatment he needed.

## 2018-02-27 NOTE — Telephone Encounter (Signed)
Clarification to note below.  Patient stated that Rocephin injection did not seem to help. He did pick up the prescription from his pharmacy as instructed and is still taking as prescribed.

## 2018-02-27 NOTE — Telephone Encounter (Signed)
This is the patient with large open abdominal wound, pending adjuvant CAPOX vs FOLFOX. He cancelled apt with me last week, but saw Dr. [name] who feels he'll be ready to start chemo in 2 weeks. Waiting for him to confirm f/u so we can finalize his treatment plan.  Regan Rakers

## 2018-02-27 NOTE — Telephone Encounter (Signed)
Called patient back he stated that when he went to the ER they immediately drained his bladder with a catheter. Pt now has a catheter until Thursday. He stated that the treatment he received in office did not help. He will set up an appointment with Urology to have this removed. Pt was advised that I would check back with Him Friday to see how he is doing.

## 2018-02-27 NOTE — Telephone Encounter (Signed)
Patient returned telephone call.  Please call again.

## 2018-02-27 NOTE — Telephone Encounter (Signed)
Thanks for the update. Only thing I want to double check is that you wrote treatment in office did not help; just to clarify he does have an infection in the bladder and should continue to take the antibiotic (keflex) that was prescribed.

## 2018-02-27 NOTE — Telephone Encounter (Signed)
Called and left a message on verified voicemail asking if the patient had received Dr. Quillian Quince message and asking him to call back ans let us know how he is feeling. CRM created.

## 2018-02-28 ENCOUNTER — Telehealth: Payer: Self-pay | Admitting: Emergency Medicine

## 2018-02-28 ENCOUNTER — Inpatient Hospital Stay: Payer: 59

## 2018-02-28 ENCOUNTER — Encounter: Payer: Self-pay | Admitting: Hematology

## 2018-02-28 ENCOUNTER — Inpatient Hospital Stay: Payer: 59 | Attending: Nurse Practitioner | Admitting: Hematology

## 2018-02-28 ENCOUNTER — Telehealth: Payer: Self-pay | Admitting: Hematology

## 2018-02-28 VITALS — BP 113/61 | HR 87 | Temp 98.0°F | Resp 18 | Ht 68.0 in | Wt 210.3 lb

## 2018-02-28 DIAGNOSIS — C186 Malignant neoplasm of descending colon: Secondary | ICD-10-CM | POA: Diagnosis not present

## 2018-02-28 DIAGNOSIS — D649 Anemia, unspecified: Secondary | ICD-10-CM | POA: Insufficient documentation

## 2018-02-28 DIAGNOSIS — D490 Neoplasm of unspecified behavior of digestive system: Secondary | ICD-10-CM

## 2018-02-28 DIAGNOSIS — I4891 Unspecified atrial fibrillation: Secondary | ICD-10-CM | POA: Insufficient documentation

## 2018-02-28 DIAGNOSIS — N390 Urinary tract infection, site not specified: Secondary | ICD-10-CM | POA: Diagnosis not present

## 2018-02-28 LAB — CBC WITH DIFFERENTIAL (CANCER CENTER ONLY)
BASOS ABS: 0 10*3/uL (ref 0.0–0.1)
BASOS PCT: 1 %
Eosinophils Absolute: 0.2 10*3/uL (ref 0.0–0.5)
Eosinophils Relative: 2 %
HCT: 33.3 % — ABNORMAL LOW (ref 38.4–49.9)
HEMOGLOBIN: 10.8 g/dL — AB (ref 13.0–17.1)
Lymphocytes Relative: 32 %
Lymphs Abs: 2.4 10*3/uL (ref 0.9–3.3)
MCH: 29 pg (ref 27.2–33.4)
MCHC: 32.4 g/dL (ref 32.0–36.0)
MCV: 89.3 fL (ref 79.3–98.0)
Monocytes Absolute: 0.6 10*3/uL (ref 0.1–0.9)
Monocytes Relative: 8 %
NEUTROS ABS: 4.4 10*3/uL (ref 1.5–6.5)
NEUTROS PCT: 57 %
Platelet Count: 308 10*3/uL (ref 140–400)
RBC: 3.73 MIL/uL — AB (ref 4.20–5.82)
RDW: 16.4 % — ABNORMAL HIGH (ref 11.0–14.6)
WBC: 7.6 10*3/uL (ref 4.0–10.3)

## 2018-02-28 LAB — CMP (CANCER CENTER ONLY)
ALK PHOS: 176 U/L — AB (ref 38–126)
ALT: 25 U/L (ref 0–44)
ANION GAP: 9 (ref 5–15)
AST: 17 U/L (ref 15–41)
Albumin: 3.2 g/dL — ABNORMAL LOW (ref 3.5–5.0)
BUN: 18 mg/dL (ref 8–23)
CALCIUM: 9.2 mg/dL (ref 8.9–10.3)
CO2: 31 mmol/L (ref 22–32)
Chloride: 102 mmol/L (ref 98–111)
Creatinine: 1.2 mg/dL (ref 0.61–1.24)
GFR, EST NON AFRICAN AMERICAN: 59 mL/min — AB (ref >60)
Glucose, Bld: 111 mg/dL — ABNORMAL HIGH (ref 70–99)
Potassium: 3.4 mmol/L — ABNORMAL LOW (ref 3.5–5.1)
Sodium: 142 mmol/L (ref 135–145)
Total Bilirubin: 0.3 mg/dL (ref 0.3–1.2)
Total Protein: 7.1 g/dL (ref 6.5–8.1)

## 2018-02-28 LAB — CEA (IN HOUSE-CHCC): CEA (CHCC-IN HOUSE): 2.39 ng/mL (ref 0.00–5.00)

## 2018-02-28 MED ORDER — ONDANSETRON HCL 8 MG PO TABS: 8.0000 mg | ORAL_TABLET | Freq: Two times a day (BID) | ORAL | 1 refills | Status: DC | PRN

## 2018-02-28 MED ORDER — PROCHLORPERAZINE MALEATE 10 MG PO TABS: 10.0000 mg | ORAL_TABLET | Freq: Four times a day (QID) | ORAL | 1 refills | Status: DC | PRN

## 2018-02-28 MED ORDER — LIDOCAINE-PRILOCAINE 2.5-2.5 % EX CREA: TOPICAL_CREAM | CUTANEOUS | 3 refills | Status: DC

## 2018-02-28 NOTE — Telephone Encounter (Signed)
Post ED Visit - Positive Culture Follow-up  Culture report reviewed by antimicrobial stewardship pharmacist:  []  Elenor Quinones, Pharm.D. []  Heide Guile, Pharm.D., BCPS AQ-ID []  Parks Neptune, Pharm.D., BCPS []  Alycia Rossetti, Pharm.D., BCPS []  [place], [place].D., BCPS, AAHIVP []  Legrand Como, Pharm.D., BCPS, AAHIVP []  Salome Arnt, PharmD, BCPS []  Johnnette Gourd, PharmD, BCPS [x]  Hughes Better, PharmD, BCPS []  Leeroy Cha, PharmD  Positive urine culture Treated with keflex, organism sensitive to the same and no further patient follow-up is required at this time.  Hazle Nordmann 02/28/2018, 12:07 PM

## 2018-02-28 NOTE — Telephone Encounter (Signed)
Post ED Visit - Positive Culture Follow-up  Culture report reviewed by antimicrobial stewardship pharmacist:  []  Elenor Quinones, Pharm.D. []  Heide Guile, Pharm.D., BCPS AQ-ID []  Parks Neptune, Pharm.D., BCPS []  Alycia Rossetti, Pharm.D., BCPS []  [place], Pharm.D., BCPS, AAHIVP []  Legrand Como, Pharm.D., BCPS, AAHIVP []  Salome Arnt, PharmD, BCPS []  Johnnette Gourd, PharmD, BCPS [x]  Hughes Better, PharmD, BCPS []  Leeroy Cha, PharmD  Positive urine culture Treated with keflex, organism sensitive to the same and no further patient follow-up is required at this time.  Hazle Nordmann 02/28/2018, 12:00 PM

## 2018-02-28 NOTE — Telephone Encounter (Signed)
Appt scheduled AVS/Calendar printed per 9/24 los

## 2018-02-28 NOTE — Progress Notes (Signed)
START ON PATHWAY REGIMEN - Colorectal     A cycle is every 14 days:     Oxaliplatin      Leucovorin      5-Fluorouracil      5-Fluorouracil   **Always confirm dose/schedule in your pharmacy ordering system**  Patient Characteristics: Colon Adjuvant, Stage III, Low Risk (T1-3, N1) Current evidence of distant metastases<= No AJCC T Category: T3 AJCC N Category: N1a AJCC M Category: M0 AJCC 8 Stage Grouping: IIIB Intent of Therapy: Curative Intent, Discussed with Patient 

## 2018-03-02 NOTE — Progress Notes (Signed)
  Oncology Nurse Navigator Documentation  Navigator Location: [facility] (03/02/18 1100) Referral date to RadOnc/MedOnc: 01/16/18 (03/02/18 1100) )Navigator Encounter Type: Introductory phone call;Telephone;Letter/Fax/Email (03/02/18 1100)   Wife e-mailed asking help with getting PAC placed and expediting treatment start date. Message sent to Dr. [name]. Wife understands that start date will be determined once we have a port date. Telephone: Financial Assistance (03/02/18 1100) Abnormal Finding Date: 01/10/18 (03/02/18 1100) Confirmed Diagnosis Date: 01/11/18 (03/02/18 1100) Surgery Date: 01/11/18 (03/02/18 1100)           Treatment Initiated Date: 01/11/18 (03/02/18 1100)   Treatment Phase: Pre-Tx/Tx Discussion (03/02/18 1100) Barriers/Navigation Needs: Coordination of Care (03/02/18 1100)   Interventions: Referrals;Psycho-social support(Inbasket sent to [facility]/Dr. [name] for [facility] placement.) (03/02/18 1100) Referrals: Other(Surgery for [facility] placement) (03/02/18 1100)          Acuity: Level 2 (03/02/18 1100)   Acuity Level 2: Ongoing guidance and education throughout treatment as needed;Assistance expediting appointments;Educational needs (03/02/18 1100)     Time Spent with Patient: 45 (03/02/18 1100)

## 2018-03-03 ENCOUNTER — Telehealth: Payer: Self-pay

## 2018-03-03 ENCOUNTER — Inpatient Hospital Stay: Payer: 59

## 2018-03-03 NOTE — Telephone Encounter (Signed)
Spoke with patient he has definitely decided to go forward with chemotherapy.  Made Dr. Burr Medico aware.

## 2018-03-05 NOTE — H&P (Signed)
Dave Vaughn Location: [facility] Patient #: [id number] DOB: September 14, 1945 Married / Language: English / Race: White Male        History of Present Illness       . 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 His wife is with him throughout the encounter. Dr. Burr Medico is his oncologist. Dr. [name] is his cardiologist. Dr. [name] Plan is his long-term gastroenterologist. [name], FNP provides primary care      He presented on January 11, 2018 with abdominal distention and vomiting and new onset atrial fibrillation. CT showed a mass in the proximal descending colon with proximal obstruction. Flexible sigmoidoscopy by Dr. [name] showed an apple core lesion at 50 cm. He immediately went underwent laparotomy left colon resection, takedown splenic flexure, and colostomy. Final pathology showed invasive adenocarcinoma, 4 cm diameter. Tumor extended into the pericolonic serosa. 13 lymph nodes were negative but there was a positive mesenteric deposit. Final staging is T3, N1c; , stage IIIB.      He is doing well. The wound is granulating in. He doesn't have any pain or problems with his colostomy. His appetite is normal. He is taking multivitamins. He is trying to walk a little bit more. He is scheduled for completion colonoscopy with Dr. [name] Plan October 1.  He is going to see his oncologist tomorrow to finalize chemotherapy plans I will standby to insert Port-A-Cath. I discussed this with him in detail, including risks and complications. He has an upcoming appointment with his cardiologist. He continues on Eliquis, Cardizem, amiodarone It is not clear whether they're going to try to cardiovert him or not      His wife will continue twice a day dressing changes Increase exercise with daily ambulation I will standby to insert Port-A-Cath Otherwise I will see him in 2 months Plan  colostomy closure in 6-9 months     Allergies  No Known Drug Allergies  Allergies Reconciled   Medication History  Amiodarone HCl (200MG  Tablet, Oral) Active. DilTIAZem HCl ER Coated Beads (240MG  Capsule ER 24HR, Oral) Active. Eliquis (5MG  Tablet, Oral) Active. Tylenol Extra Strength (500MG  Tablet, Oral as needed) Active. traMADol HCl (50MG  Tablet, Oral) Active. Medications Reconciled  Vitals Weight: 205.8 lb Height: 70in Body Surface Area: 2.11 m Body Mass Index: 29.53 kg/m  Pain Level: 0/10 Temp.: 98.16F(Temporal)  Pulse: 113 (Regular)  P.OX: 97% (Room air) BP: 118/68 (Sitting, Left Arm, Standard)       Physical Exam  General Note: Alert. Friendly. No distress. Wife present. Good insight. Mental status normal.   Cardiovascular Note: Heart sounds good. Pulse slightly irregular. Controlled rate.  Lungs: Clear to auscultation bilaterally.   Abdomen Note: Abdomen soft and nontender. Midline wound open. Healthy granulation tissue. Much smaller. Suspect some separation of linea alba but no significant bulge. Colostomy pink and healthy. No hernia around stoma. 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  You are recovering from your emergency colon surgery without any major complications The midline wound is clean and granulating in and getting smaller. I am pleased with wound care Continue twice a day dressing changes as outlined Take a 1 mile walk every day No sports or heavy lifting yet  Keep your appointment with Dr. Burr Medico tomorrow. I anticipate that she will finalize chemotherapy plans. Ask her whether she thinks a PET scan is indicated. Give me a call  to schedule the Port-A-Cath insertion once that decision is made We discussed the indications, techniques, and risk of Port-A-Cath insertion in detail today Hold eliquis for 2 -3 days prior to [facility] placement.  Keep your appointment  with Dr. [name]. He will decide whether to try to convert your atrial fibrillation or not  You need a completion colonoscopy with Dr. [name] Plan. you stated this is scheduled on October 1. I would go ahead with that at this time.  Otherwise, return to see Dr. [name] in 2 months to see how well your wound is healing.  STATUS POST COLOSTOMY, FOLLOW-UP EXAM (Z09) WOUND INFECTION AFTER SURGERY (T81.49XA) ATRIAL FIBRILLATION, NEW ONSET (I48.91) ANTICOAGULATED (Z79.01) Impression: eliquis    Juandiego Kolenovic M. [name], M.D., [facility] General and Minimally invasive Surgery Breast and Colorectal Surgery Office:   [phone] Pager:   [phone]

## 2018-03-06 ENCOUNTER — Other Ambulatory Visit: Payer: Self-pay | Admitting: General Surgery

## 2018-03-06 ENCOUNTER — Encounter (HOSPITAL_COMMUNITY): Payer: Self-pay

## 2018-03-07 ENCOUNTER — Encounter (HOSPITAL_COMMUNITY)
Admission: RE | Disposition: A | Discharge status: Home / Self Care | Payer: Self-pay | Source: Ambulatory Visit | Attending: General Surgery

## 2018-03-07 ENCOUNTER — Ambulatory Visit: Payer: 59 | Admitting: Gastroenterology

## 2018-03-07 ENCOUNTER — Ambulatory Visit (HOSPITAL_COMMUNITY): Payer: 59

## 2018-03-07 ENCOUNTER — Encounter (HOSPITAL_COMMUNITY): Payer: Self-pay | Admitting: *Deleted

## 2018-03-07 ENCOUNTER — Ambulatory Visit (HOSPITAL_COMMUNITY): Payer: 59 | Admitting: Certified Registered"

## 2018-03-07 ENCOUNTER — Ambulatory Visit (HOSPITAL_COMMUNITY)
Admission: RE | Admit: 2018-03-07 | Discharge: 2018-03-07 | Disposition: A | Discharge status: Home / Self Care | Payer: 59 | Source: Ambulatory Visit | Attending: General Surgery | Admitting: General Surgery

## 2018-03-07 DIAGNOSIS — Z79899 Other long term (current) drug therapy: Secondary | ICD-10-CM | POA: Insufficient documentation

## 2018-03-07 DIAGNOSIS — Z95828 Presence of other vascular implants and grafts: Secondary | ICD-10-CM

## 2018-03-07 DIAGNOSIS — Z7901 Long term (current) use of anticoagulants: Secondary | ICD-10-CM | POA: Insufficient documentation

## 2018-03-07 DIAGNOSIS — Z933 Colostomy status: Secondary | ICD-10-CM | POA: Insufficient documentation

## 2018-03-07 DIAGNOSIS — I4891 Unspecified atrial fibrillation: Secondary | ICD-10-CM | POA: Insufficient documentation

## 2018-03-07 DIAGNOSIS — C186 Malignant neoplasm of descending colon: Secondary | ICD-10-CM | POA: Diagnosis not present

## 2018-03-07 DIAGNOSIS — C189 Malignant neoplasm of colon, unspecified: Secondary | ICD-10-CM

## 2018-03-07 HISTORY — DX: Cardiac arrhythmia, unspecified: I49.9

## 2018-03-07 HISTORY — DX: Anemia, unspecified: D64.9

## 2018-03-07 HISTORY — DX: Personal history of urinary calculi: Z87.442

## 2018-03-07 HISTORY — PX: PORTACATH PLACEMENT: SHX2246

## 2018-03-07 LAB — PROTIME-INR
INR: 1.04
Prothrombin Time: 13.5 seconds (ref 11.4–15.2)

## 2018-03-07 SURGERY — INSERTION, TUNNELED CENTRAL VENOUS DEVICE, WITH PORT
Anesthesia: General | Site: Neck | Laterality: Right

## 2018-03-07 MED ORDER — LIDOCAINE 2% (20 MG/ML) 5 ML SYRINGE
INTRAMUSCULAR | Status: DC | PRN
  Administered 2018-03-07: 80 mg via INTRAVENOUS

## 2018-03-07 MED ORDER — CEFAZOLIN SODIUM-DEXTROSE 2-4 GM/100ML-% IV SOLN
2.0000 g | INTRAVENOUS | Status: AC
  Administered 2018-03-07: 2 g via INTRAVENOUS
  Filled 2018-03-07: qty 100

## 2018-03-07 MED ORDER — ONDANSETRON HCL 4 MG/2ML IJ SOLN
INTRAMUSCULAR | Status: AC
  Filled 2018-03-07: qty 2

## 2018-03-07 MED ORDER — HEPARIN SOD (PORK) LOCK FLUSH 100 UNIT/ML IV SOLN
INTRAVENOUS | Status: AC
  Filled 2018-03-07: qty 5

## 2018-03-07 MED ORDER — BUPIVACAINE-EPINEPHRINE (PF) 0.25% -1:200000 IJ SOLN
INTRAMUSCULAR | Status: AC
  Filled 2018-03-07: qty 30

## 2018-03-07 MED ORDER — ACETAMINOPHEN 500 MG PO TABS
1000.0000 mg | ORAL_TABLET | ORAL | Status: AC
  Administered 2018-03-07: 1000 mg via ORAL
  Filled 2018-03-07: qty 2

## 2018-03-07 MED ORDER — BUPIVACAINE-EPINEPHRINE 0.25% -1:200000 IJ SOLN
INTRAMUSCULAR | Status: DC | PRN
  Administered 2018-03-07: 8 mL

## 2018-03-07 MED ORDER — HYDROMORPHONE HCL 1 MG/ML IJ SOLN
INTRAMUSCULAR | Status: AC
  Filled 2018-03-07: qty 1

## 2018-03-07 MED ORDER — IOPAMIDOL (ISOVUE-300) INJECTION 61%
INTRAVENOUS | Status: AC
  Filled 2018-03-07: qty 50

## 2018-03-07 MED ORDER — PHENYLEPHRINE 40 MCG/ML (10ML) SYRINGE FOR IV PUSH (FOR BLOOD PRESSURE SUPPORT)
PREFILLED_SYRINGE | INTRAVENOUS | Status: AC
  Filled 2018-03-07: qty 10

## 2018-03-07 MED ORDER — GABAPENTIN 300 MG PO CAPS
300.0000 mg | ORAL_CAPSULE | ORAL | Status: AC
  Administered 2018-03-07: 300 mg via ORAL
  Filled 2018-03-07: qty 1

## 2018-03-07 MED ORDER — ROCURONIUM BROMIDE 50 MG/5ML IV SOSY
PREFILLED_SYRINGE | INTRAVENOUS | Status: AC
  Filled 2018-03-07: qty 5

## 2018-03-07 MED ORDER — OXYCODONE HCL 5 MG PO TABS
5.0000 mg | ORAL_TABLET | Freq: Once | ORAL | Status: AC | PRN
  Administered 2018-03-07: 5 mg via ORAL

## 2018-03-07 MED ORDER — PHENYLEPHRINE HCL 10 MG/ML IJ SOLN
INTRAMUSCULAR | Status: DC | PRN
  Administered 2018-03-07: 120 ug via INTRAVENOUS
  Administered 2018-03-07 (×5): 80 ug via INTRAVENOUS
  Administered 2018-03-07: 40 ug via INTRAVENOUS
  Administered 2018-03-07: 120 ug via INTRAVENOUS
  Administered 2018-03-07: 80 ug via INTRAVENOUS

## 2018-03-07 MED ORDER — FENTANYL CITRATE (PF) 100 MCG/2ML IJ SOLN
INTRAMUSCULAR | Status: DC | PRN
  Administered 2018-03-07: 25 ug via INTRAVENOUS

## 2018-03-07 MED ORDER — FENTANYL CITRATE (PF) 250 MCG/5ML IJ SOLN
INTRAMUSCULAR | Status: AC
  Filled 2018-03-07: qty 5

## 2018-03-07 MED ORDER — CHLORHEXIDINE GLUCONATE CLOTH 2 % EX PADS: 6.0000 | MEDICATED_PAD | Freq: Once | CUTANEOUS | Status: DC

## 2018-03-07 MED ORDER — SODIUM CHLORIDE 0.9 % IV SOLN
INTRAVENOUS | Status: AC
  Filled 2018-03-07: qty 1.2

## 2018-03-07 MED ORDER — LIDOCAINE 2% (20 MG/ML) 5 ML SYRINGE
INTRAMUSCULAR | Status: AC
  Filled 2018-03-07: qty 5

## 2018-03-07 MED ORDER — 0.9 % SODIUM CHLORIDE (POUR BTL) OPTIME
TOPICAL | Status: DC | PRN
  Administered 2018-03-07: 1000 mL

## 2018-03-07 MED ORDER — HEPARIN SOD (PORK) LOCK FLUSH 100 UNIT/ML IV SOLN
INTRAVENOUS | Status: DC | PRN
  Administered 2018-03-07: 500 [IU] via INTRAVENOUS

## 2018-03-07 MED ORDER — DEXAMETHASONE SODIUM PHOSPHATE 10 MG/ML IJ SOLN
INTRAMUSCULAR | Status: DC | PRN
  Administered 2018-03-07: 10 mg via INTRAVENOUS

## 2018-03-07 MED ORDER — PROPOFOL 10 MG/ML IV BOLUS
INTRAVENOUS | Status: DC | PRN
  Administered 2018-03-07: 200 mg via INTRAVENOUS

## 2018-03-07 MED ORDER — DEXAMETHASONE SODIUM PHOSPHATE 10 MG/ML IJ SOLN
INTRAMUSCULAR | Status: AC
  Filled 2018-03-07: qty 1

## 2018-03-07 MED ORDER — OXYCODONE HCL 5 MG PO TABS
ORAL_TABLET | ORAL | Status: AC
  Filled 2018-03-07: qty 1

## 2018-03-07 MED ORDER — LACTATED RINGERS IV SOLN
INTRAVENOUS | Status: DC
  Administered 2018-03-07: 08:00:00 via INTRAVENOUS

## 2018-03-07 MED ORDER — LIDOCAINE-EPINEPHRINE (PF) 1 %-1:200000 IJ SOLN
INTRAMUSCULAR | Status: AC
  Filled 2018-03-07: qty 30

## 2018-03-07 MED ORDER — HYDROMORPHONE HCL 1 MG/ML IJ SOLN
0.2500 mg | INTRAMUSCULAR | Status: DC | PRN
  Administered 2018-03-07: 0.5 mg via INTRAVENOUS

## 2018-03-07 MED ORDER — MIDAZOLAM HCL 2 MG/2ML IJ SOLN
INTRAMUSCULAR | Status: AC
  Filled 2018-03-07: qty 2

## 2018-03-07 MED ORDER — SODIUM CHLORIDE 0.9 % IV SOLN
INTRAVENOUS | Status: DC | PRN
  Administered 2018-03-07: 500 mL

## 2018-03-07 MED ORDER — PROPOFOL 10 MG/ML IV BOLUS
INTRAVENOUS | Status: AC
  Filled 2018-03-07: qty 40

## 2018-03-07 MED ORDER — CELECOXIB 100 MG PO CAPS
100.0000 mg | ORAL_CAPSULE | ORAL | Status: AC
  Administered 2018-03-07: 100 mg via ORAL
  Filled 2018-03-07 (×2): qty 1

## 2018-03-07 MED ORDER — PROMETHAZINE HCL 25 MG/ML IJ SOLN: 6.2500 mg | INTRAMUSCULAR | Status: DC | PRN

## 2018-03-07 MED ORDER — OXYCODONE HCL 5 MG/5ML PO SOLN: 5.0000 mg | Freq: Once | ORAL | Status: AC | PRN

## 2018-03-07 MED ORDER — ONDANSETRON HCL 4 MG/2ML IJ SOLN
INTRAMUSCULAR | Status: DC | PRN
  Administered 2018-03-07: 4 mg via INTRAVENOUS

## 2018-03-07 SURGICAL SUPPLY — 44 items
ADH SKN CLS APL DERMABOND .7 (GAUZE/BANDAGES/DRESSINGS) ×1
BAG DECANTER FOR FLEXI CONT (MISCELLANEOUS) ×2 IMPLANT
BLADE CLIPPER SURG (BLADE) ×1 IMPLANT
CANISTER SUCT 3000ML PPV (MISCELLANEOUS) IMPLANT
CHLORAPREP W/TINT 26ML (MISCELLANEOUS) ×2 IMPLANT
COVER SURGICAL LIGHT HANDLE (MISCELLANEOUS) ×2 IMPLANT
COVER TRANSDUCER ULTRASND GEL (DRAPE) IMPLANT
COVER WAND RF STERILE (DRAPES) ×1 IMPLANT
CRADLE DONUT ADULT HEAD (MISCELLANEOUS) ×2 IMPLANT
DERMABOND ADVANCED (GAUZE/BANDAGES/DRESSINGS) ×1
DERMABOND ADVANCED .7 DNX12 (GAUZE/BANDAGES/DRESSINGS) ×1 IMPLANT
DRAPE C-ARM 42X72 X-RAY (DRAPES) ×2 IMPLANT
DRAPE LAPAROSCOPIC ABDOMINAL (DRAPES) ×2 IMPLANT
ELECT CAUTERY BLADE 6.4 (BLADE) ×2 IMPLANT
ELECT REM PT RETURN 9FT ADLT (ELECTROSURGICAL) ×2
ELECTRODE REM PT RTRN 9FT ADLT (ELECTROSURGICAL) ×1 IMPLANT
GAUZE 4X4 16PLY RFD (DISPOSABLE) ×2 IMPLANT
GLOVE EUDERMIC 7 POWDERFREE (GLOVE) ×3 IMPLANT
GOWN STRL REUS W/ TWL LRG LVL3 (GOWN DISPOSABLE) ×1 IMPLANT
GOWN STRL REUS W/ TWL XL LVL3 (GOWN DISPOSABLE) ×1 IMPLANT
GOWN STRL REUS W/TWL LRG LVL3 (GOWN DISPOSABLE) ×4
GOWN STRL REUS W/TWL XL LVL3 (GOWN DISPOSABLE) ×2
INTRODUCER 13FR (INTRODUCER) IMPLANT
INTRODUCER COOK 11FR (CATHETERS) IMPLANT
KIT BASIN OR (CUSTOM PROCEDURE TRAY) ×2 IMPLANT
KIT PORT POWER 8FR ISP CVUE (Port) ×1 IMPLANT
KIT TURNOVER KIT B (KITS) ×2 IMPLANT
NS IRRIG 1000ML POUR BTL (IV SOLUTION) ×2 IMPLANT
PAD ARMBOARD 7.5X6 YLW CONV (MISCELLANEOUS) ×2 IMPLANT
PENCIL BUTTON HOLSTER BLD 10FT (ELECTRODE) ×2 IMPLANT
SET INTRODUCER 12FR PACEMAKER (INTRODUCER) IMPLANT
SET SHEATH INTRODUCER 10FR (MISCELLANEOUS) IMPLANT
SHEATH COOK PEEL AWAY SET 9F (SHEATH) IMPLANT
SURGILUBE 3G PEEL PACK STRL (MISCELLANEOUS) IMPLANT
SUT MNCRL AB 4-0 PS2 18 (SUTURE) ×2 IMPLANT
SUT PROLENE 2 0 CT2 30 (SUTURE) ×2 IMPLANT
SUT VIC AB 3-0 SH 18 (SUTURE) ×2 IMPLANT
SYR 10ML LL (SYRINGE) ×4 IMPLANT
SYR 5ML LUER SLIP (SYRINGE) ×2 IMPLANT
TOWEL OR 17X24 6PK STRL BLUE (TOWEL DISPOSABLE) ×2 IMPLANT
TOWEL OR 17X26 10 PK STRL BLUE (TOWEL DISPOSABLE) ×2 IMPLANT
TRAY LAPAROSCOPIC MC (CUSTOM PROCEDURE TRAY) ×2 IMPLANT
TUBE CONNECTING 12X1/4 (SUCTIONS) IMPLANT
YANKAUER SUCT BULB TIP NO VENT (SUCTIONS) IMPLANT

## 2018-03-07 NOTE — Transfer of Care (Signed)
Immediate Anesthesia Transfer of Care Note  Patient: Dave Vaughn  Procedure(s) Performed: INSERTION PORT-A-CATH RIGHT SUBCLAVIAN (Right Neck)  Patient Location: PACU  Anesthesia Type:General  Level of Consciousness: awake, alert  and oriented  Airway & Oxygen Therapy: Patient Spontanous Breathing and Patient connected to nasal cannula oxygen  Post-op Assessment: Report given to RN, Post -op Vital signs reviewed and stable and Patient moving all extremities  Post vital signs: Reviewed and stable  Last Vitals:  Vitals Value Taken Time  BP    Temp    Pulse    Resp    SpO2      Last Pain:  Vitals:   03/07/18 0729  PainSc: 0-No pain      Patients Stated Pain Goal: 6 (72/62/03 5597)  Complications: No apparent anesthesia complications

## 2018-03-07 NOTE — Discharge Instructions (Signed)
° ° °PORT-A-CATH: POST OP INSTRUCTIONS ° °Always review your discharge instruction sheet given to you by the facility where your surgery was performed.  ° °1. A prescription for pain medication may be given to you upon discharge. Take your pain medication as prescribed, if needed. If narcotic pain medicine is not needed, then you make take acetaminophen (Tylenol) or ibuprofen (Advil) as needed.  °2. Take your usually prescribed medications unless otherwise directed. °3. If you need a refill on your pain medication, please contact our office. All narcotic pain medicine now requires a paper prescription.  Phoned in and fax refills are no longer allowed by law.  Prescriptions will not be filled after 5 pm or on weekends.  °4. You should follow a light diet for the remainder of the day after your procedure. °5. Most patients will experience some mild swelling and/or bruising in the area of the incision. It may take several days to resolve. °6. It is common to experience some constipation if taking pain medication after surgery. Increasing fluid intake and taking a stool softener (such as Colace) will usually help or prevent this problem from occurring. A mild laxative (Milk of Magnesia or Miralax) should be taken according to package directions if there are no bowel movements after 48 hours.  °7. Unless discharge instructions indicate otherwise, you may remove your bandages 48 hours after surgery, and you may shower at that time. You may have steri-strips (small white skin tapes) in place directly over the incision.  These strips should be left on the skin for 7-10 days.  If your surgeon used Dermabond (skin glue) on the incision, you may shower in 24 hours.  The glue will flake off over the next 2-3 weeks.  °8. If your port is left accessed at the end of surgery (needle left in port), the dressing cannot get wet and should only by changed by a healthcare professional. When the port is no longer accessed (when the  needle has been removed), follow step 7.   °9. ACTIVITIES:  Limit activity involving your arms for the next 72 hours. Do no strenuous exercise or activity for 1 week. You may drive when you are no longer taking prescription pain medication, you can comfortably wear a seatbelt, and you can maneuver your car. °10.You may need to see your doctor in the office for a follow-up appointment.  Please °      check with your doctor.  °11.When you receive a new Port-a-Cath, you will get a product guide and  °      ID card.  Please keep them in case you need them. ° °WHEN TO CALL YOUR DOCTOR ([phone]): °1. Fever over 101.0 °2. Chills °3. Continued bleeding from incision °4. Increased redness and tenderness at the site °5. Shortness of breath, difficulty breathing ° ° °The clinic staff is available to answer your questions during regular business hours. Please don’t hesitate to call and ask to speak to one of the nurses or medical assistants for clinical concerns. If you have a medical emergency, go to the nearest emergency room or call 911.  A surgeon from [facility] is always on call at the hospital.  ° ° ° °For further information, please visit [email or web address] ° ° ° ° ° ° °PORT-A-CATH: POST OP INSTRUCTIONS ° °Always review your discharge instruction sheet given to you by the facility where your surgery was performed.  ° °10. A prescription for pain medication may be given to you upon discharge.   Take your pain medication as prescribed, if needed. If narcotic pain medicine is not needed, then you make take acetaminophen (Tylenol) or ibuprofen (Advil) as needed.  °11. Take your usually prescribed medications unless otherwise directed. °12. If you need a refill on your pain medication, please contact our office. All narcotic pain medicine now requires a paper prescription.  Phoned in and fax refills are no longer allowed by law.  Prescriptions will not be filled after 5 pm or on weekends.   °13. You should follow a light diet for the remainder of the day after your procedure. °14. Most patients will experience some mild swelling and/or bruising in the area of the incision. It may take several days to resolve. °15. It is common to experience some constipation if taking pain medication after surgery. Increasing fluid intake and taking a stool softener (such as Colace) will usually help or prevent this problem from occurring. A mild laxative (Milk of Magnesia or Miralax) should be taken according to package directions if there are no bowel movements after 48 hours.  °16. Unless discharge instructions indicate otherwise, you may remove your bandages 48 hours after surgery, and you may shower at that time. You may have steri-strips (small white skin tapes) in place directly over the incision.  These strips should be left on the skin for 7-10 days.  If your surgeon used Dermabond (skin glue) on the incision, you may shower in 24 hours.  The glue will flake off over the next 2-3 weeks.  °17. If your port is left accessed at the end of surgery (needle left in port), the dressing cannot get wet and should only by changed by a healthcare professional. When the port is no longer accessed (when the needle has been removed), follow step 7.   °18. ACTIVITIES:  Limit activity involving your arms for the next 72 hours. Do no strenuous exercise or activity for 1 week. You may drive when you are no longer taking prescription pain medication, you can comfortably wear a seatbelt, and you can maneuver your car. °10.You may need to see your doctor in the office for a follow-up appointment.  Please °      check with your doctor.  °11.When you receive a new Port-a-Cath, you will get a product guide and  °      ID card.  Please keep them in case you need them. ° °WHEN TO CALL YOUR DOCTOR ([phone]): °6. Fever over 101.0 °7. Chills °8. Continued bleeding from incision °9. Increased redness and tenderness at the  site °10. Shortness of breath, difficulty breathing ° ° °The clinic staff is available to answer your questions during regular business hours. Please don’t hesitate to call and ask to speak to one of the nurses or medical assistants for clinical concerns. If you have a medical emergency, go to the nearest emergency room or call 911.  A surgeon from [facility] is always on call at the hospital.  ° ° ° °For further information, please visit [email or web address] ° ° ° ° °

## 2018-03-07 NOTE — Anesthesia Procedure Notes (Signed)
Procedure Name: LMA Insertion Date/Time: 03/07/2018 9:55 AM Performed by: Lynda Rainwater, MD Pre-anesthesia Checklist: Patient identified, Emergency Drugs available, Suction available and Patient being monitored Patient Re-evaluated:Patient Re-evaluated prior to induction Oxygen Delivery Method: Circle System Utilized Preoxygenation: Pre-oxygenation with 100% oxygen Induction Type: IV induction Ventilation: Mask ventilation without difficulty LMA: LMA inserted LMA Size: 5.0 Number of attempts: 1 Airway Equipment and Method: Bite block Placement Confirmation: positive ETCO2 Tube secured with: Tape Dental Injury: Teeth and Oropharynx as per pre-operative assessment  Comments: Placed by Tia Alert, CRNA

## 2018-03-07 NOTE — Anesthesia Postprocedure Evaluation (Signed)
Anesthesia Post Note  Patient: Dave Vaughn  Procedure(s) Performed: INSERTION PORT-A-CATH RIGHT SUBCLAVIAN (Right Neck)     Patient location during evaluation: PACU Anesthesia Type: General Level of consciousness: awake and alert Pain management: pain level controlled Vital Signs Assessment: post-procedure vital signs reviewed and stable Respiratory status: spontaneous breathing, nonlabored ventilation and respiratory function stable Cardiovascular status: blood pressure returned to baseline and stable Postop Assessment: no apparent nausea or vomiting Anesthetic complications: no    Last Vitals:  Vitals:   03/07/18 1137 03/07/18 1141  BP: 115/88   Pulse: 69   Resp:  20  Temp:  (!) 36.1 C  SpO2: 97%     Last Pain:  Vitals:   03/07/18 1141  PainSc: 3                  Lynda Rainwater

## 2018-03-07 NOTE — Op Note (Signed)
Patient Name:           Dave Vaughn   Date of Surgery:        03/07/2018  Pre op Diagnosis:      Cancer descending colon  Post op Diagnosis:    Same  Procedure:                 Insertion of PowerPort [place] 8 French tunneled venous vascular access device                                       Use of fluoroscopy for guidance and positioning  Surgeon:                     Dave Vaughn, M.D., FACS  Assistant:                      OR staff  Operative Indications:   This is a very pleasant 72 year old man who is brought to the operating room for insertion of Port-A-Cath.Dr. Burr Vaughn is his oncologist. Dr. Terrence Vaughn is his cardiologist. Dr. [name] Vaughn is his long-term gastroenterologist. Dave Clines, FNP provides primary care      He presented on January 11, 2018 with abdominal distention and vomiting and new onset atrial fibrillation. CT showed a mass in the proximal descending colon with proximal obstruction. Flexible sigmoidoscopy by Dr. Lyndel Vaughn showed an apple core lesion at 50 cm. He immediately went underwent laparotomy left colon resection, takedown splenic flexure, and colostomy. Final pathology showed invasive adenocarcinoma, 4 cm diameter. Tumor extended into the pericolonic serosa. 13 lymph nodes were negative but there was a positive mesenteric deposit. Final staging is T3, N1c; , stage IIIB.    He is recovering uneventfully.  Completion colonoscopy is planned later this month.  Chemotherapy is planned to start tomorrow.[name] He takes Eliquis because of his atrial fibrillation and has held that for 48 hours.  I have discussed the indications, techniques, and risk of Port-A-Cath insertion with him in detail.  He agrees with this Vaughn  Operative Findings:       The port was placed through the right subclavian vein.  C arm was used frequently for guidance and positioning.  The tip of the catheter is in the SVC at the right atrial junction.  The port was left accessed to facilitate  chemotherapy tomorrow.  Postop chest x-ray is pending  Procedure in Detail:          Following the induction of general LMA anesthesia the patient was positioned with a roll behind his shoulders and his arms tucked at his sides.  The neck and chest were prepped and draped in a sterile fashion.  Surgical timeout was performed.  Intravenous antibiotics were given.  0.5% Marcaine with epinephrine was used as a local infiltration anesthetic.      A right subclavian venipuncture was performed with good blood return without difficulty.  Guidewire was threaded into the superior vena cava under fluoroscopic guidance.  A template was drawn on  the chest wall to assist in catheter positioning so the catheter tip would be in the SVC near the right atrium.  A small incision was made at the wire insertion site.  A transverse incision was made below the midportion of the clavicle.  A subcutaneous pocket was created at the level of the pectoralis fascia.  Using a tunneling device  I passed the catheter from the wire insertion site to the port pocket site.  Using the fluoroscopically guided template on the chest wall I cut the catheter 22 cm in length.  The catheter was secured to the port with the locking device and the port was flushed with heparinized saline.  The port was sutured to the pectoralis fascia with 3 interrupted sutures of 2-0 Prolene.  The dilator and peel-away sheath assembly were inserted over the guidewire into the central venous circulation.  The wire and dilator were removed.  The catheter was threaded into the central venous circulation and the peel-away sheath removed.  Catheter flushed easily and had excellent blood return.  Fluroscopy confirmed good positioning of the catheter with the tip of the catheter just above the right atrium.  There were no deformities of the catheter anywhere along its course.  The subcutaneous tissue was closed with 3-0 Vicryl and the skin incisions were closed with subcuticular  4-0 Monocryl and Steri-Strips.  I accessed the catheter with the angled Huber needle which was swedged on to an extension tubing that had been previously flushed with heparin.  The needle went into the port I confirmed good blood return and good flow.  I then flushed the entire assembly with concentrated heparin.  The needle assembly was then secured to the chest wall with multiple Steri-Strips and benzoin.  Tegaderm was placed on top.  Patient tolerated the procedure well and was taken to PACU in stable condition.  EBL 10 cc.  Counts correct.  Complications none.    Addendum: I logged onto the [employer] and reviewed his prescription medication history     Dave Vaughn, M.D., FACS General and Minimally Invasive Surgery Breast and Colorectal Surgery  03/07/2018 10:58 AM

## 2018-03-07 NOTE — Anesthesia Preprocedure Evaluation (Signed)
Anesthesia Evaluation  Patient identified by MRN, date of birth, ID band Patient awake    Reviewed: Allergy & Precautions, NPO status , Patient's Chart, lab work & pertinent test results  Airway Mallampati: II  TM Distance: >3 FB Neck ROM: Full    Dental no notable dental hx.    Pulmonary neg pulmonary ROS,    Pulmonary exam normal breath sounds clear to auscultation       Cardiovascular Normal cardiovascular exam+ dysrhythmias Atrial Fibrillation  Rhythm:Regular Rate:Normal  New onset afib   Neuro/Psych negative neurological ROS  negative psych ROS   GI/Hepatic negative GI ROS, Neg liver ROS,   Endo/Other  negative endocrine ROS  Renal/GU negative Renal ROS  negative genitourinary   Musculoskeletal negative musculoskeletal ROS (+)   Abdominal (+) + obese,   Peds negative pediatric ROS (+)  Hematology negative hematology ROS (+)   Anesthesia Other Findings Cancer  Reproductive/Obstetrics negative OB ROS                             Anesthesia Physical  Anesthesia Plan  ASA: III  Anesthesia Plan: General   Post-op Pain Management:    Induction: Intravenous  PONV Risk Score and Plan: 2 and Ondansetron, Midazolam and Treatment may vary due to age or medical condition  Airway Management Planned: LMA  Additional Equipment:   Intra-op Plan:   Post-operative Plan: Extubation in OR  Informed Consent: I have reviewed the patients History and Physical, chart, labs and discussed the procedure including the risks, benefits and alternatives for the proposed anesthesia with the patient or authorized representative who has indicated his/her understanding and acceptance.   Dental advisory given  Plan Discussed with: CRNA and Surgeon  Anesthesia Plan Comments:         Anesthesia Quick Evaluation

## 2018-03-07 NOTE — Interval H&P Note (Signed)
History and Physical Interval Note:  03/07/2018 9:17 AM  Dave Vaughn  has presented today for surgery, with the diagnosis of COLON CANCER  The various methods of treatment have been discussed with the patient and family. After consideration of risks, benefits and other options for treatment, the patient has consented to  Procedure(s): INSERTION PORT-A-CATH (N/A) as a surgical intervention .  The patient's history has been reviewed, patient examined, no change in status, stable for surgery.  I have reviewed the patient's chart and labs.  Questions were answered to the patient's satisfaction.     [name]

## 2018-03-08 ENCOUNTER — Inpatient Hospital Stay (HOSPITAL_BASED_OUTPATIENT_CLINIC_OR_DEPARTMENT_OTHER): Payer: 59 | Admitting: Medical

## 2018-03-08 ENCOUNTER — Encounter (HOSPITAL_COMMUNITY): Payer: Self-pay | Admitting: General Surgery

## 2018-03-08 ENCOUNTER — Inpatient Hospital Stay: Payer: 59 | Attending: Nurse Practitioner

## 2018-03-08 ENCOUNTER — Inpatient Hospital Stay: Payer: 59

## 2018-03-08 VITALS — BP 104/62 | HR 66 | Temp 98.4°F | Resp 16

## 2018-03-08 DIAGNOSIS — C186 Malignant neoplasm of descending colon: Secondary | ICD-10-CM | POA: Diagnosis not present

## 2018-03-08 DIAGNOSIS — Z5111 Encounter for antineoplastic chemotherapy: Secondary | ICD-10-CM | POA: Insufficient documentation

## 2018-03-08 DIAGNOSIS — D490 Neoplasm of unspecified behavior of digestive system: Secondary | ICD-10-CM

## 2018-03-08 DIAGNOSIS — I4891 Unspecified atrial fibrillation: Secondary | ICD-10-CM | POA: Diagnosis not present

## 2018-03-08 DIAGNOSIS — D649 Anemia, unspecified: Secondary | ICD-10-CM | POA: Insufficient documentation

## 2018-03-08 DIAGNOSIS — Z452 Encounter for adjustment and management of vascular access device: Secondary | ICD-10-CM | POA: Diagnosis not present

## 2018-03-08 DIAGNOSIS — R338 Other retention of urine: Secondary | ICD-10-CM | POA: Diagnosis not present

## 2018-03-08 DIAGNOSIS — Z23 Encounter for immunization: Secondary | ICD-10-CM | POA: Insufficient documentation

## 2018-03-08 DIAGNOSIS — N390 Urinary tract infection, site not specified: Secondary | ICD-10-CM | POA: Diagnosis not present

## 2018-03-08 DIAGNOSIS — R600 Localized edema: Secondary | ICD-10-CM | POA: Diagnosis not present

## 2018-03-08 LAB — CBC WITH DIFFERENTIAL (CANCER CENTER ONLY)
BASOS ABS: 0 10*3/uL (ref 0.0–0.1)
Basophils Relative: 0 %
Eosinophils Absolute: 0 10*3/uL (ref 0.0–0.5)
Eosinophils Relative: 0 %
HEMATOCRIT: 30.8 % — AB (ref 38.4–49.9)
HEMOGLOBIN: 10.3 g/dL — AB (ref 13.0–17.1)
LYMPHS ABS: 1.5 10*3/uL (ref 0.9–3.3)
LYMPHS PCT: 11 %
MCH: 29.3 pg (ref 27.2–33.4)
MCHC: 33.5 g/dL (ref 32.0–36.0)
MCV: 87.4 fL (ref 79.3–98.0)
Monocytes Absolute: 0.5 10*3/uL (ref 0.1–0.9)
Monocytes Relative: 4 %
Neutro Abs: 12.1 10*3/uL — ABNORMAL HIGH (ref 1.5–6.5)
Neutrophils Relative %: 85 %
Platelet Count: 373 10*3/uL (ref 140–400)
RBC: 3.52 MIL/uL — AB (ref 4.20–5.82)
RDW: 17.3 % — ABNORMAL HIGH (ref 11.0–14.6)
WBC Count: 14.1 10*3/uL — ABNORMAL HIGH (ref 4.0–10.3)

## 2018-03-08 LAB — CMP (CANCER CENTER ONLY)
ALK PHOS: 128 U/L — AB (ref 38–126)
ALT: 13 U/L (ref 0–44)
AST: 15 U/L (ref 15–41)
Albumin: 3.2 g/dL — ABNORMAL LOW (ref 3.5–5.0)
Anion gap: 10 (ref 5–15)
BILIRUBIN TOTAL: 0.3 mg/dL (ref 0.3–1.2)
BUN: 18 mg/dL (ref 8–23)
CALCIUM: 9.2 mg/dL (ref 8.9–10.3)
CO2: 27 mmol/L (ref 22–32)
CREATININE: 1.17 mg/dL (ref 0.61–1.24)
Chloride: 106 mmol/L (ref 98–111)
Glucose, Bld: 184 mg/dL — ABNORMAL HIGH (ref 70–99)
Potassium: 4.1 mmol/L (ref 3.5–5.1)
Sodium: 143 mmol/L (ref 135–145)
Total Protein: 6.8 g/dL (ref 6.5–8.1)

## 2018-03-08 LAB — CEA (IN HOUSE-CHCC): CEA (CHCC-IN HOUSE): 10.92 ng/mL — AB (ref 0.00–5.00)

## 2018-03-08 MED ORDER — PALONOSETRON HCL INJECTION 0.25 MG/5ML
INTRAVENOUS | Status: AC
  Filled 2018-03-08: qty 5

## 2018-03-08 MED ORDER — PALONOSETRON HCL INJECTION 0.25 MG/5ML
0.2500 mg | Freq: Once | INTRAVENOUS | Status: AC
  Administered 2018-03-08: 0.25 mg via INTRAVENOUS

## 2018-03-08 MED ORDER — DEXAMETHASONE SODIUM PHOSPHATE 10 MG/ML IJ SOLN
INTRAMUSCULAR | Status: AC
  Filled 2018-03-08: qty 1

## 2018-03-08 MED ORDER — LEUCOVORIN CALCIUM INJECTION 350 MG
400.0000 mg/m2 | Freq: Once | INTRAVENOUS | Status: AC
  Administered 2018-03-08: 856 mg via INTRAVENOUS
  Filled 2018-03-08: qty 42.8

## 2018-03-08 MED ORDER — DEXAMETHASONE SODIUM PHOSPHATE 10 MG/ML IJ SOLN
10.0000 mg | Freq: Once | INTRAMUSCULAR | Status: AC
  Administered 2018-03-08: 10 mg via INTRAVENOUS

## 2018-03-08 MED ORDER — FLUOROURACIL CHEMO INJECTION 2.5 GM/50ML
400.0000 mg/m2 | Freq: Once | INTRAVENOUS | Status: AC
  Administered 2018-03-08: 850 mg via INTRAVENOUS
  Filled 2018-03-08: qty 17

## 2018-03-08 MED ORDER — INFLUENZA VAC SPLIT QUAD 0.5 ML IM SUSY
0.5000 mL | PREFILLED_SYRINGE | Freq: Once | INTRAMUSCULAR | Status: AC
  Administered 2018-03-08: 0.5 mL via INTRAMUSCULAR

## 2018-03-08 MED ORDER — SODIUM CHLORIDE 0.9 % IV SOLN
2400.0000 mg/m2 | INTRAVENOUS | Status: DC
  Administered 2018-03-08: 5150 mg via INTRAVENOUS
  Filled 2018-03-08: qty 103

## 2018-03-08 MED ORDER — INFLUENZA VAC SPLIT QUAD 0.5 ML IM SUSY
PREFILLED_SYRINGE | INTRAMUSCULAR | Status: AC
  Filled 2018-03-08: qty 0.5

## 2018-03-08 MED ORDER — OXALIPLATIN CHEMO INJECTION 100 MG/20ML
85.0000 mg/m2 | Freq: Once | INTRAVENOUS | Status: AC
  Administered 2018-03-08: 180 mg via INTRAVENOUS
  Filled 2018-03-08: qty 36

## 2018-03-08 MED ORDER — DEXTROSE 5 % IV SOLN
Freq: Once | INTRAVENOUS | Status: AC
  Administered 2018-03-08: 09:00:00 via INTRAVENOUS
  Filled 2018-03-08: qty 250

## 2018-03-08 NOTE — Patient Instructions (Signed)
[facility] Discharge Instructions for Patients Receiving Chemotherapy  Today you received the following chemotherapy agents:  Oxaliplatin, Leucovorin, and 5FU.  To help prevent nausea and vomiting after your treatment, we encourage you to take your nausea medication as directed.   If you develop nausea and vomiting that is not controlled by your nausea medication, call the clinic.   BELOW ARE SYMPTOMS THAT SHOULD BE REPORTED IMMEDIATELY:  *FEVER GREATER THAN 100.5 F  *CHILLS WITH OR WITHOUT FEVER  NAUSEA AND VOMITING THAT IS NOT CONTROLLED WITH YOUR NAUSEA MEDICATION  *UNUSUAL SHORTNESS OF BREATH  *UNUSUAL BRUISING OR BLEEDING  TENDERNESS IN MOUTH AND THROAT WITH OR WITHOUT PRESENCE OF ULCERS  *URINARY PROBLEMS  *BOWEL PROBLEMS  UNUSUAL RASH Items with * indicate a potential emergency and should be followed up as soon as possible.  Feel free to call the clinic should you have any questions or concerns. The clinic phone number is [phone].  Please show the CHEMO ALERT CARD at check-in to the Emergency Department and triage nurse.  Oxaliplatin Injection What is this medicine? OXALIPLATIN (ox AL i PLA tin) is a chemotherapy drug. It targets fast dividing cells, like cancer cells, and causes these cells to die. This medicine is used to treat cancers of the colon and rectum, and many other cancers. This medicine may be used for other purposes; ask your health care provider or pharmacist if you have questions. COMMON BRAND NAME(S): Eloxatin What should I tell my health care provider before I take this medicine? They need to know if you have any of these conditions: -kidney disease -an unusual or allergic reaction to oxaliplatin, other chemotherapy, other medicines, foods, dyes, or preservatives -pregnant or trying to get pregnant -breast-feeding How should I use this medicine? This drug is given as an infusion into a vein. It is administered in a hospital  or clinic by a specially trained health care professional. Talk to your pediatrician regarding the use of this medicine in children. Special care may be needed. Overdosage: If you think you have taken too much of this medicine contact a poison control center or emergency room at once. NOTE: This medicine is only for you. Do not share this medicine with others. What if I miss a dose? It is important not to miss a dose. Call your doctor or health care professional if you are unable to keep an appointment. What may interact with this medicine? -medicines to increase blood counts like filgrastim, pegfilgrastim, sargramostim -probenecid -some antibiotics like amikacin, gentamicin, neomycin, polymyxin B, streptomycin, tobramycin -zalcitabine Talk to your doctor or health care professional before taking any of these medicines: -acetaminophen -aspirin -ibuprofen -ketoprofen -naproxen This list may not describe all possible interactions. Give your health care provider a list of all the medicines, herbs, non-prescription drugs, or dietary supplements you use. Also tell them if you smoke, drink alcohol, or use illegal drugs. Some items may interact with your medicine. What should I watch for while using this medicine? Your condition will be monitored carefully while you are receiving this medicine. You will need important blood work done while you are taking this medicine. This medicine can make you more sensitive to cold. Do not drink cold drinks or use ice. Cover exposed skin before coming in contact with cold temperatures or cold objects. When out in cold weather wear warm clothing and cover your mouth and nose to warm the air that goes into your lungs. Tell your doctor if you get sensitive to the   cold. This drug may make you feel generally unwell. This is not uncommon, as chemotherapy can affect healthy cells as well as cancer cells. Report any side effects. Continue your course of treatment even  though you feel ill unless your doctor tells you to stop. In some cases, you may be given additional medicines to help with side effects. Follow all directions for their use. Call your doctor or health care professional for advice if you get a fever, chills or sore throat, or other symptoms of a cold or flu. Do not treat yourself. This drug decreases your body's ability to fight infections. Try to avoid being around people who are sick. This medicine may increase your risk to bruise or bleed. Call your doctor or health care professional if you notice any unusual bleeding. Be careful brushing and flossing your teeth or using a toothpick because you may get an infection or bleed more easily. If you have any dental work done, tell your dentist you are receiving this medicine. Avoid taking products that contain aspirin, acetaminophen, ibuprofen, naproxen, or ketoprofen unless instructed by your doctor. These medicines may hide a fever. Do not become pregnant while taking this medicine. Women should inform their doctor if they wish to become pregnant or think they might be pregnant. There is a potential for serious side effects to an unborn child. Talk to your health care professional or pharmacist for more information. Do not breast-feed an infant while taking this medicine. Call your doctor or health care professional if you get diarrhea. Do not treat yourself. What side effects may I notice from receiving this medicine? Side effects that you should report to your doctor or health care professional as soon as possible: -allergic reactions like skin rash, itching or hives, swelling of the face, lips, or tongue -low blood counts - This drug may decrease the number of white blood cells, red blood cells and platelets. You may be at increased risk for infections and bleeding. -signs of infection - fever or chills, cough, sore throat, pain or difficulty passing urine -signs of decreased platelets or bleeding -  bruising, pinpoint red spots on the skin, black, tarry stools, nosebleeds -signs of decreased red blood cells - unusually weak or tired, fainting spells, lightheadedness -breathing problems -chest pain, pressure -cough -diarrhea -jaw tightness -mouth sores -nausea and vomiting -pain, swelling, redness or irritation at the injection site -pain, tingling, numbness in the hands or feet -problems with balance, talking, walking -redness, blistering, peeling or loosening of the skin, including inside the mouth -trouble passing urine or change in the amount of urine Side effects that usually do not require medical attention (report to your doctor or health care professional if they continue or are bothersome): -changes in vision -constipation -hair loss -loss of appetite -metallic taste in the mouth or changes in taste -stomach pain This list may not describe all possible side effects. Call your doctor for medical advice about side effects. You may report side effects to FDA at 1-800-FDA-1088. Where should I keep my medicine? This drug is given in a hospital or clinic and will not be stored at home. NOTE: This sheet is a summary. It may not cover all possible information. If you have questions about this medicine, talk to your doctor, pharmacist, or health care provider.  2018 [employer] Standard (2007-12-19 17:22:47)  Leucovorin injection What is this medicine? LEUCOVORIN (loo koe VOR in) is used to prevent or treat the harmful effects of some medicines. This medicine is used to   treat anemia caused by a low amount of folic acid in the body. It is also used with 5-fluorouracil (5-FU) to treat colon cancer. This medicine may be used for other purposes; ask your health care provider or pharmacist if you have questions. What should I tell my health care provider before I take this medicine? They need to know if you have any of these conditions: -anemia from low levels of vitamin B-12 in  the blood -an unusual or allergic reaction to leucovorin, folic acid, other medicines, foods, dyes, or preservatives -pregnant or trying to get pregnant -breast-feeding How should I use this medicine? This medicine is for injection into a muscle or into a vein. It is given by a health care professional in a hospital or clinic setting. Talk to your pediatrician regarding the use of this medicine in children. Special care may be needed. Overdosage: If you think you have taken too much of this medicine contact a poison control center or emergency room at once. NOTE: This medicine is only for you. Do not share this medicine with others. What if I miss a dose? This does not apply. What may interact with this medicine? -capecitabine -fluorouracil -phenobarbital -phenytoin -primidone -trimethoprim-sulfamethoxazole This list may not describe all possible interactions. Give your health care provider a list of all the medicines, herbs, non-prescription drugs, or dietary supplements you use. Also tell them if you smoke, drink alcohol, or use illegal drugs. Some items may interact with your medicine. What should I watch for while using this medicine? Your condition will be monitored carefully while you are receiving this medicine. This medicine may increase the side effects of 5-fluorouracil, 5-FU. Tell your doctor or health care professional if you have diarrhea or mouth sores that do not get better or that get worse. What side effects may I notice from receiving this medicine? Side effects that you should report to your doctor or health care professional as soon as possible: -allergic reactions like skin rash, itching or hives, swelling of the face, lips, or tongue -breathing problems -fever, infection -mouth sores -unusual bleeding or bruising -unusually weak or tired Side effects that usually do not require medical attention (report to your doctor or health care professional if they continue or  are bothersome): -constipation or diarrhea -loss of appetite -nausea, vomiting This list may not describe all possible side effects. Call your doctor for medical advice about side effects. You may report side effects to FDA at 1-800-FDA-1088. Where should I keep my medicine? This drug is given in a hospital or clinic and will not be stored at home. NOTE: This sheet is a summary. It may not cover all possible information. If you have questions about this medicine, talk to your doctor, pharmacist, or health care provider.  2018 [employer]/Gold Standard (2007-11-28 16:50:29)  Fluorouracil, 5-FU injection What is this medicine? FLUOROURACIL, 5-FU (flure oh YOOR a sil) is a chemotherapy drug. It slows the growth of cancer cells. This medicine is used to treat many types of cancer like breast cancer, colon or rectal cancer, pancreatic cancer, and stomach cancer. This medicine may be used for other purposes; ask your health care provider or pharmacist if you have questions. COMMON BRAND NAME(S): Adrucil What should I tell my health care provider before I take this medicine? They need to know if you have any of these conditions: -blood disorders -dihydropyrimidine dehydrogenase (DPD) deficiency -infection (especially a virus infection such as chickenpox, cold sores, or herpes) -kidney disease -liver disease -malnourished, poor nutrition -recent   or ongoing radiation therapy -an unusual or allergic reaction to fluorouracil, other chemotherapy, other medicines, foods, dyes, or preservatives -pregnant or trying to get pregnant -breast-feeding How should I use this medicine? This drug is given as an infusion or injection into a vein. It is administered in a hospital or clinic by a specially trained health care professional. Talk to your pediatrician regarding the use of this medicine in children. Special care may be needed. Overdosage: If you think you have taken too much of this medicine contact a  poison control center or emergency room at once. NOTE: This medicine is only for you. Do not share this medicine with others. What if I miss a dose? It is important not to miss your dose. Call your doctor or health care professional if you are unable to keep an appointment. What may interact with this medicine? -allopurinol -cimetidine -dapsone -digoxin -hydroxyurea -leucovorin -levamisole -medicines for seizures like ethotoin, fosphenytoin, phenytoin -medicines to increase blood counts like filgrastim, pegfilgrastim, sargramostim -medicines that treat or prevent blood clots like warfarin, enoxaparin, and dalteparin -methotrexate -metronidazole -pyrimethamine -some other chemotherapy drugs like busulfan, cisplatin, estramustine, vinblastine -trimethoprim -trimetrexate -vaccines Talk to your doctor or health care professional before taking any of these medicines: -acetaminophen -aspirin -ibuprofen -ketoprofen -naproxen This list may not describe all possible interactions. Give your health care provider a list of all the medicines, herbs, non-prescription drugs, or dietary supplements you use. Also tell them if you smoke, drink alcohol, or use illegal drugs. Some items may interact with your medicine. What should I watch for while using this medicine? Visit your doctor for checks on your progress. This drug may make you feel generally unwell. This is not uncommon, as chemotherapy can affect healthy cells as well as cancer cells. Report any side effects. Continue your course of treatment even though you feel ill unless your doctor tells you to stop. In some cases, you may be given additional medicines to help with side effects. Follow all directions for their use. Call your doctor or health care professional for advice if you get a fever, chills or sore throat, or other symptoms of a cold or flu. Do not treat yourself. This drug decreases your body's ability to fight infections. Try to  avoid being around people who are sick. This medicine may increase your risk to bruise or bleed. Call your doctor or health care professional if you notice any unusual bleeding. Be careful brushing and flossing your teeth or using a toothpick because you may get an infection or bleed more easily. If you have any dental work done, tell your dentist you are receiving this medicine. Avoid taking products that contain aspirin, acetaminophen, ibuprofen, naproxen, or ketoprofen unless instructed by your doctor. These medicines may hide a fever. Do not become pregnant while taking this medicine. Women should inform their doctor if they wish to become pregnant or think they might be pregnant. There is a potential for serious side effects to an unborn child. Talk to your health care professional or pharmacist for more information. Do not breast-feed an infant while taking this medicine. Men should inform their doctor if they wish to father a child. This medicine may lower sperm counts. Do not treat diarrhea with over the counter products. Contact your doctor if you have diarrhea that lasts more than 2 days or if it is severe and watery. This medicine can make you more sensitive to the sun. Keep out of the sun. If you cannot avoid being in the   sun, wear protective clothing and use sunscreen. Do not use sun lamps or tanning beds/booths. What side effects may I notice from receiving this medicine? Side effects that you should report to your doctor or health care professional as soon as possible: -allergic reactions like skin rash, itching or hives, swelling of the face, lips, or tongue -low blood counts - this medicine may decrease the number of white blood cells, red blood cells and platelets. You may be at increased risk for infections and bleeding. -signs of infection - fever or chills, cough, sore throat, pain or difficulty passing urine -signs of decreased platelets or bleeding - bruising, pinpoint red spots  on the skin, black, tarry stools, blood in the urine -signs of decreased red blood cells - unusually weak or tired, fainting spells, lightheadedness -breathing problems -changes in vision -chest pain -mouth sores -nausea and vomiting -pain, swelling, redness at site where injected -pain, tingling, numbness in the hands or feet -redness, swelling, or sores on hands or feet -stomach pain -unusual bleeding Side effects that usually do not require medical attention (report to your doctor or health care professional if they continue or are bothersome): -changes in finger or toe nails -diarrhea -dry or itchy skin -hair loss -headache -loss of appetite -sensitivity of eyes to the light -stomach upset -unusually teary eyes This list may not describe all possible side effects. Call your doctor for medical advice about side effects. You may report side effects to FDA at 1-800-FDA-1088. Where should I keep my medicine? This drug is given in a hospital or clinic and will not be stored at home. NOTE: This sheet is a summary. It may not cover all possible information. If you have questions about this medicine, talk to your doctor, pharmacist, or health care provider.  2018 [employer]/Gold Standard (2007-09-27 13:53:16)    

## 2018-03-09 NOTE — Progress Notes (Addendum)
[facility] Progress Note   Dave Vaughn [id number] Oct 03, 1945 72 y.o.  Vanessa Barbara is managed by Dr. [name]  Actively treated with chemotherapy/immunotherapy: yes  Current Therapy: FOLFOX  Last Treated: 03/08/2018 (cycle 1)  Assessment: Plan:    Bilateral lower extremity edema  Cancer of left colon ([place])   Bilateral lower extremity edema.  The patient's chart was reviewed.   He is only had a 2 pound weight gain recently.  His labs returned showing an albumin slightly low at 3.2.  His creatinine and BUN were within normal limits.  I have discussed with the patient that he should elevate his lower extremities above his hips as he is able.  I also suggested that he begin using thigh-high compression stockings and limit sodium intake.  I have asked him to request that I take a look at his legs when he returns on Friday for his pump removal.  Cancer of the left colon: Mr. Takagi presents to the office today for cycle 1 of FOLFOX which is dosed in the care of Dr. Burr Medico.  Please see After Visit Summary for patient specific instructions.  Future Appointments  Date Time Provider [place]  03/10/2018 11:30 AM [facility] None  03/22/2018 11:15 AM [facility] FLUSH [facility] None  03/22/2018 11:30 AM [facility] LAB 4 [facility] None  03/22/2018 11:45 AM Alla Feeling, NP [facility] None  03/22/2018 12:30 PM [facility] INFUSION [facility] None  03/24/2018  1:00 PM [facility] None  04/11/2018  2:45 PM [name], MD [facility]  06/14/2018  8:00 AM [name], MD [facility]  06/14/2018  9:00 AM Wynetta Fines, RN [facility]    No orders of the defined types were placed in this encounter.      Subjective:   Patient ID:  Dave Vaughn is a 72 y.o. (DOB January 01, 1946) male.  Chief Complaint: No chief complaint on file.   HPI Dave Vaughn is a 72 year old male with a history of a  stage IIIb (pT3, pN1c, cM0) left colon cancer.  He is managed by Dr. [name] and presents for cycle 1 of FOLFOX today.  He reports having bilateral lower extremity edema which is developed over the past several weeks.  He was recently found to have A. fib and was begun on diltiazem and amiodarone.  Additionally he has had a recent urinary tract infection which caused obstruction.  He had a catheter placed and was begun on Flomax.  His Foley catheter has been removed.  He reports that he tends to have a low-sodium diet.  He denies shortness of breath, chest congestion, chest pain, or cough.  Medications: I have reviewed the patient's current medications.  Allergies: No Known Allergies  Past Medical History:  Diagnosis Date  . Adenocarcinoma, colon ([place])   . Anemia    taking iron supplements  . Atrial fibrillation with RVR ([place])   . Diverticulitis   . Dysrhythmia    afib  . History of kidney stones   . Kidney stones     Past Surgical History:  Procedure Laterality Date  . ANKLE SURGERY Left    when he was in college  . COLON RESECTION N/A 01/11/2018   Procedure: LEFT COLON RESECTION, TAKEDOWN SPLENIC FLEXURE, COLOSTOMY;  Surgeon: [name], MD;  Location: [facility];  Service: General;  Laterality: N/A;  . COLONOSCOPY  01/11/2018   Procedure: COLONOSCOPY;  Surgeon: Jackquline Denmark, MD;  Location:  [facility];  Service: Endoscopy;;  . PORTACATH PLACEMENT Right 03/07/2018   Procedure: INSERTION PORT-A-CATH RIGHT SUBCLAVIAN;  Surgeon: [name], MD;  Location: [facility];  Service: General;  Laterality: Right;  . thumb surgery   2018   cyst removal    Family History  Problem Relation Age of Onset  . Breast cancer Mother 58       metastatin; recurrence x7  . Heart attack Father 38  . Cancer Daughter        melanoma, leukemia    Social History   Socioeconomic History  . Marital status: Married    Spouse name: Not on file  . Number of children: Not on file  . Years of education: Not  on file  . Highest education level: Not on file  Occupational History  . Not on file  Social Needs  . Financial resource strain: Not on file  . Food insecurity:    Worry: Not on file    Inability: Not on file  . Transportation needs:    Medical: Not on file    Non-medical: Not on file  Tobacco Use  . Smoking status: Never Smoker  . Smokeless tobacco: Never Used  Substance and Sexual Activity  . Alcohol use: Yes    Comment: drinks socially about 2x/week  . Drug use: Never  . Sexual activity: Not on file  Lifestyle  . Physical activity:    Days per week: Not on file    Minutes per session: Not on file  . Stress: Not on file  Relationships  . Social connections:    Talks on phone: Not on file    Gets together: Not on file    Attends religious service: Not on file    Active member of club or organization: Not on file    Attends meetings of clubs or organizations: Not on file    Relationship status: Not on file  . Intimate partner violence:    Fear of current or ex partner: Not on file    Emotionally abused: Not on file    Physically abused: Not on file    Forced sexual activity: Not on file  Other Topics Concern  . Not on file  Social History Narrative  . Not on file    Past Medical History, Surgical history, Social history, and Family history were reviewed and updated as appropriate.   Please see review of systems for further details on the patient's review from today.   Review of Systems:  Review of Systems  Constitutional: Negative for chills, diaphoresis and fever.  HENT: Negative for trouble swallowing and voice change.   Respiratory: Negative for cough, chest tightness, shortness of breath and wheezing.   Cardiovascular: Positive for leg swelling. Negative for chest pain and palpitations.  Gastrointestinal: Negative for abdominal pain, constipation, diarrhea, nausea and vomiting.  Musculoskeletal: Negative for back pain and myalgias.  Neurological: Negative  for dizziness, light-headedness and headaches.    Objective:   Physical Exam:  There were no vitals taken for this visit. ECOG: 0  Physical Exam  Constitutional: No distress.  HENT:  Head: Normocephalic and atraumatic.  Cardiovascular: Normal rate and normal heart sounds. An irregularly irregular rhythm present. Exam reveals no gallop and no friction rub.  No murmur heard.   No presacral edema.  Pulmonary/Chest: Effort normal and breath sounds normal. No respiratory distress. He has no wheezes. He has no rales.  Abdominal: Bowel sounds are normal. He exhibits no distension and no mass.  There is no tenderness. There is no rebound and no guarding.  Colostomy is noted in the left lower quadrant.  Musculoskeletal: He exhibits edema (Trace bilateral lower extremity pitting edema to the knees.).  Calves are bilaterally equal at 39 cm at 14 cm distal to the inferior pole of the patella.  Neurological: He is alert.  Skin: Skin is warm and dry. No rash noted. He is not diaphoretic. No erythema.    Lab Review:     Component Value Date/Time   NA 143 03/08/2018 0815   K 4.1 03/08/2018 0815   CL 106 03/08/2018 0815   CO2 27 03/08/2018 0815   GLUCOSE 184 (H) 03/08/2018 0815   BUN 18 03/08/2018 0815   CREATININE 1.17 03/08/2018 0815   CALCIUM 9.2 03/08/2018 0815   PROT 6.8 03/08/2018 0815   ALBUMIN 3.2 (L) 03/08/2018 0815   AST 15 03/08/2018 0815   ALT 13 03/08/2018 0815   ALKPHOS 128 (H) 03/08/2018 0815   BILITOT 0.3 03/08/2018 0815   GFRNONAA >60 03/08/2018 0815   GFRAA >60 03/08/2018 0815       Component Value Date/Time   WBC 14.1 (H) 03/08/2018 0815   WBC 7.6 01/16/2018 0423   RBC 3.52 (L) 03/08/2018 0815   HGB 10.3 (L) 03/08/2018 0815   HCT 30.8 (L) 03/08/2018 0815   PLT 373 03/08/2018 0815   MCV 87.4 03/08/2018 0815   MCH 29.3 03/08/2018 0815   MCHC 33.5 03/08/2018 0815   RDW 17.3 (H) 03/08/2018 0815   LYMPHSABS 1.5 03/08/2018 0815   MONOABS 0.5 03/08/2018 0815    EOSABS 0.0 03/08/2018 0815   BASOSABS 0.0 03/08/2018 0815   -------------------------------  Imaging from last 24 hours (if applicable):  Radiology interpretation: Dg Chest Port 1 View  Result Date: 03/07/2018 CLINICAL DATA:  Port-A-Cath placement EXAM: PORTABLE CHEST 1 VIEW COMPARISON:  01/23/2018 FINDINGS: Right Port-A-Cath in place with the tip in the SVC. No pneumothorax. Heart and mediastinal contours are within normal limits. No focal opacities or effusions. No acute bony abnormality. IMPRESSION: Right Port-A-Cath tip in the SVC. No pneumothorax. No active cardiopulmonary disease. Electronically Signed   By: Rolm Baptise M.D.   On: 03/07/2018 11:21   Dg Fluoro Guide Cv Line-no Report  Result Date: 03/07/2018 Fluoroscopy was utilized by the requesting physician.  No radiographic interpretation.        This case was discussed with Dr. Burr Medico. She expressed agreement with my management of this patient.

## 2018-03-09 NOTE — Progress Notes (Signed)
  Oncology Nurse Navigator Documentation  Navigator Location: [facility] (03/09/18 1241)   )Navigator Encounter Type: Telephone (03/09/18 1241) Telephone: Outgoing Call;Education (03/09/18 1241)    Called and spoke with Dave Vaughn to check in after first chemo tx on 03/08/18. Patient has had some nausea which has been relieved with Compazine.  Reviewed use of nausea medications and encouraged to call with further questions or concerns.                 Treatment Phase: First Chemo Tx(First chemo started 03/08/18) (03/09/18 1241) Barriers/Navigation Needs: Education (03/09/18 1241) Education: Pain/ Symptom Management (03/09/18 1241) Interventions: Education;Psycho-social support (03/09/18 1241) Referrals: Symptom Management (03/09/18 1241)   Education Method: Verbal (03/09/18 1241)  Support Groups/Services: Friends and Family (03/09/18 1241)   Acuity: Level 2 (03/09/18 1241)   Acuity Level 2: Educational needs;Ongoing guidance and education throughout treatment as needed (03/09/18 1241)     Time Spent with Patient: 15 (03/09/18 1241)

## 2018-03-10 ENCOUNTER — Inpatient Hospital Stay: Payer: 59

## 2018-03-10 VITALS — BP 122/81 | HR 68 | Temp 98.0°F | Resp 18

## 2018-03-10 DIAGNOSIS — C186 Malignant neoplasm of descending colon: Secondary | ICD-10-CM

## 2018-03-10 DIAGNOSIS — Z5111 Encounter for antineoplastic chemotherapy: Secondary | ICD-10-CM | POA: Diagnosis not present

## 2018-03-10 MED ORDER — HEPARIN SOD (PORK) LOCK FLUSH 100 UNIT/ML IV SOLN
500.0000 [IU] | Freq: Once | INTRAVENOUS | Status: AC | PRN
  Administered 2018-03-10: 500 [IU]
  Filled 2018-03-10: qty 5

## 2018-03-10 MED ORDER — SODIUM CHLORIDE 0.9% FLUSH
10.0000 mL | INTRAVENOUS | Status: DC | PRN
  Administered 2018-03-10: 10 mL
  Filled 2018-03-10: qty 10

## 2018-03-14 ENCOUNTER — Other Ambulatory Visit: Payer: Self-pay | Admitting: Hematology

## 2018-03-14 ENCOUNTER — Telehealth: Payer: Self-pay | Admitting: *Deleted

## 2018-03-14 MED ORDER — ALPRAZOLAM 0.25 MG PO TABS: 0.2500 mg | ORAL_TABLET | Freq: Every evening | ORAL | 0 refills | Status: DC | PRN

## 2018-03-14 NOTE — Telephone Encounter (Signed)
Message forwarded to Dr Burr Medico:  Home health nurse Sharyon Cable called regarding patient.  States he is complaining of problems sleeping & anxiety related to new Cholostomy.  Recent new start of chemo.  Would you consider prescribing something for this.  Return call to Sharyon Cable [phone] with [facility]  Pending instruction

## 2018-03-19 ENCOUNTER — Emergency Department (HOSPITAL_BASED_OUTPATIENT_CLINIC_OR_DEPARTMENT_OTHER)
Admission: EM | Admit: 2018-03-19 | Discharge: 2018-03-19 | Disposition: A | Discharge status: Home / Self Care | Payer: 59 | Attending: Emergency Medicine | Admitting: Emergency Medicine

## 2018-03-19 ENCOUNTER — Emergency Department (HOSPITAL_BASED_OUTPATIENT_CLINIC_OR_DEPARTMENT_OTHER)
Admission: EM | Admit: 2018-03-19 | Discharge: 2018-03-19 | Disposition: A | Discharge status: Home / Self Care | Payer: 59 | Source: Home / Self Care | Attending: Emergency Medicine | Admitting: Emergency Medicine

## 2018-03-19 ENCOUNTER — Other Ambulatory Visit: Payer: Self-pay

## 2018-03-19 ENCOUNTER — Emergency Department (HOSPITAL_BASED_OUTPATIENT_CLINIC_OR_DEPARTMENT_OTHER): Payer: 59

## 2018-03-19 ENCOUNTER — Encounter (HOSPITAL_BASED_OUTPATIENT_CLINIC_OR_DEPARTMENT_OTHER): Payer: Self-pay | Admitting: Adult Health

## 2018-03-19 DIAGNOSIS — N3090 Cystitis, unspecified without hematuria: Secondary | ICD-10-CM | POA: Insufficient documentation

## 2018-03-19 DIAGNOSIS — Z7901 Long term (current) use of anticoagulants: Secondary | ICD-10-CM

## 2018-03-19 DIAGNOSIS — Z85038 Personal history of other malignant neoplasm of large intestine: Secondary | ICD-10-CM

## 2018-03-19 DIAGNOSIS — Z79899 Other long term (current) drug therapy: Secondary | ICD-10-CM

## 2018-03-19 DIAGNOSIS — R339 Retention of urine, unspecified: Secondary | ICD-10-CM

## 2018-03-19 DIAGNOSIS — R3915 Urgency of urination: Secondary | ICD-10-CM | POA: Insufficient documentation

## 2018-03-19 DIAGNOSIS — N390 Urinary tract infection, site not specified: Secondary | ICD-10-CM

## 2018-03-19 DIAGNOSIS — R300 Dysuria: Secondary | ICD-10-CM | POA: Diagnosis present

## 2018-03-19 LAB — CBC WITH DIFFERENTIAL/PLATELET
ABS IMMATURE GRANULOCYTES: 0.05 10*3/uL (ref 0.00–0.07)
BASOS ABS: 0 10*3/uL (ref 0.0–0.1)
BASOS PCT: 0 %
Eosinophils Absolute: 0.1 10*3/uL (ref 0.0–0.5)
Eosinophils Relative: 0 %
HCT: 35.5 % — ABNORMAL LOW (ref 39.0–52.0)
Hemoglobin: 11.2 g/dL — ABNORMAL LOW (ref 13.0–17.0)
IMMATURE GRANULOCYTES: 0 %
LYMPHS ABS: 2.2 10*3/uL (ref 0.7–4.0)
Lymphocytes Relative: 14 %
MCH: 28.9 pg (ref 26.0–34.0)
MCHC: 31.5 g/dL (ref 30.0–36.0)
MCV: 91.7 fL (ref 80.0–100.0)
MONOS PCT: 8 %
Monocytes Absolute: 1.2 10*3/uL — ABNORMAL HIGH (ref 0.1–1.0)
NEUTROS ABS: 12.4 10*3/uL — AB (ref 1.7–7.7)
NEUTROS PCT: 78 %
NRBC: 0 % (ref 0.0–0.2)
Platelets: 237 10*3/uL (ref 150–400)
RBC: 3.87 MIL/uL — ABNORMAL LOW (ref 4.22–5.81)
RDW: 18.3 % — ABNORMAL HIGH (ref 11.5–15.5)
WBC: 16 10*3/uL — ABNORMAL HIGH (ref 4.0–10.5)

## 2018-03-19 LAB — COMPREHENSIVE METABOLIC PANEL
ALBUMIN: 3.8 g/dL (ref 3.5–5.0)
ALT: 27 U/L (ref 0–44)
AST: 27 U/L (ref 15–41)
Alkaline Phosphatase: 111 U/L (ref 38–126)
Anion gap: 12 (ref 5–15)
BUN: 17 mg/dL (ref 8–23)
CO2: 25 mmol/L (ref 22–32)
CREATININE: 1.25 mg/dL — AB (ref 0.61–1.24)
Calcium: 9 mg/dL (ref 8.9–10.3)
Chloride: 99 mmol/L (ref 98–111)
GFR calc Af Amer: >60 mL/min (ref >60)
GFR, EST NON AFRICAN AMERICAN: 56 mL/min — AB (ref >60)
GLUCOSE: 132 mg/dL — AB (ref 70–99)
POTASSIUM: 3.9 mmol/L (ref 3.5–5.1)
SODIUM: 136 mmol/L (ref 135–145)
Total Bilirubin: 1.1 mg/dL (ref 0.3–1.2)
Total Protein: 7.2 g/dL (ref 6.5–8.1)

## 2018-03-19 LAB — URINALYSIS, MICROSCOPIC (REFLEX)

## 2018-03-19 LAB — URINALYSIS, ROUTINE W REFLEX MICROSCOPIC
BILIRUBIN URINE: NEGATIVE
Glucose, UA: NEGATIVE mg/dL
Ketones, ur: NEGATIVE mg/dL
NITRITE: POSITIVE — AB
Protein, ur: 30 mg/dL — AB
SPECIFIC GRAVITY, URINE: 1.01 (ref 1.005–1.030)
pH: 5.5 (ref 5.0–8.0)

## 2018-03-19 LAB — I-STAT CG4 LACTIC ACID, ED: LACTIC ACID, VENOUS: 0.75 mmol/L (ref 0.5–1.9)

## 2018-03-19 MED ORDER — SODIUM CHLORIDE 0.9 % IV BOLUS
1000.0000 mL | Freq: Once | INTRAVENOUS | Status: AC
  Administered 2018-03-19: 1000 mL via INTRAVENOUS

## 2018-03-19 MED ORDER — CEFPODOXIME PROXETIL 200 MG PO TABS: 200.0000 mg | ORAL_TABLET | Freq: Two times a day (BID) | ORAL | 0 refills | Status: AC

## 2018-03-19 MED ORDER — SODIUM CHLORIDE 0.9 % IV SOLN
2.0000 g | Freq: Once | INTRAVENOUS | Status: AC
  Administered 2018-03-19: 2 g via INTRAVENOUS
  Filled 2018-03-19: qty 20

## 2018-03-19 MED ORDER — IOPAMIDOL (ISOVUE-300) INJECTION 61%
100.0000 mL | Freq: Once | INTRAVENOUS | Status: AC | PRN
  Administered 2018-03-19: 100 mL via INTRAVENOUS

## 2018-03-19 NOTE — ED Notes (Signed)
Pt to go home with F/C in place. Pt education provided.

## 2018-03-19 NOTE — ED Triage Notes (Signed)
Pt states that he has been having dysuria and frequency since 2100. Pt had a fever at home T max of 101.4 pt took tylenol around 0000. Denies back pain, nausea or vomiting. Has tried cranberry juice and AZO

## 2018-03-19 NOTE — ED Provider Notes (Addendum)
[facility] EMERGENCY DEPARTMENT Provider Note   CSN: [id number] Arrival date & time: 03/19/18  0426     History   Chief Complaint Chief Complaint  Patient presents with  . Dysuria    HPI Dave Vaughn is a 72 y.o. male.  HPI very pleasant 72 year old male with history of colon cancer status post hemicolectomy, here with painful urination.  The patient states that he felt like he was in his usual state of health throughout the day today.  However, this afternoon, he began to develop a aching, fullness sensation in his lower abdomen with associated burning with urination.  He has also had increased frequency, but less volume per urination.  He has a history of previous UTIs and his symptoms currently feels similar to his last episode in September.  Of note, the patient did not have problems with UTIs prior to his recent diagnosis of colon cancer and surgery.  He does have a history of BPH and is on Flomax followed by Dr. Gilford Rile.  Of note, he also recently started chemotherapy 2 weeks ago, and did have a fever up to 101 today.  This resolved very quickly.  He denies any nausea or vomiting.  Denies any flank pain.  No other complaints.  His ostomy output is been normal.  Past Medical History:  Diagnosis Date  . Adenocarcinoma, colon ([place])   . Anemia    taking iron supplements  . Atrial fibrillation with RVR ([place])   . Diverticulitis   . Dysrhythmia    afib  . History of kidney stones   . Kidney stones     Patient Active Problem List   Diagnosis Date Noted  . Cancer of left colon ([place]) 01/16/2018  . Protein-calorie malnutrition, severe 01/14/2018  . New onset a-fib ([place]) 01/11/2018  . Colonic obstruction ([place]) 01/10/2018  . Bowel obstruction ([place]) 01/10/2018    Past Surgical History:  Procedure Laterality Date  . ANKLE SURGERY Left    when he was in college  . COLON RESECTION N/A 01/11/2018   Procedure: LEFT COLON RESECTION, TAKEDOWN SPLENIC FLEXURE, COLOSTOMY;   Surgeon: [name], MD;  [facility];  Service: General;  Laterality: N/A;  . COLONOSCOPY  01/11/2018   Procedure: COLONOSCOPY;  Surgeon: Jackquline Denmark, MD;  [facility];  Service: Endoscopy;;  . PORTACATH PLACEMENT Right 03/07/2018   Procedure: INSERTION PORT-A-CATH RIGHT SUBCLAVIAN;  Surgeon: [name], MD;  Location: [facility];  Service: General;  Laterality: Right;  . thumb surgery   2018   cyst removal        Home Medications    Prior to Admission medications   Medication Sig Start Date End Date Taking? Authorizing Provider  acetaminophen (TYLENOL) 500 MG tablet Take 1,000 mg by mouth every 6 (six) hours as needed (for pain.).    [provider]  ALPRAZolam Duanne Moron) 0.25 MG tablet Take 1 tablet (0.25 mg total) by mouth at bedtime as needed for anxiety. 03/14/18   [name], MD  amiodarone (PACERONE) 200 MG tablet Take 1 tablet (200 mg total) by mouth 2 (two) times daily. 01/17/18   Georgette Shell, MD  apixaban (ELIQUIS) 5 MG TABS tablet Take 1 tablet (5 mg total) by mouth 2 (two) times daily. 01/17/18   Georgette Shell, MD  cefpodoxime (VANTIN) 200 MG tablet Take 1 tablet (200 mg total) by mouth 2 (two) times daily for 10 days. 03/19/18 03/29/18  Duffy Bruce, MD  diltiazem (CARTIA XT) 240 MG 24  hr capsule Take 1 capsule (240 mg total) by mouth daily. 01/17/18 01/17/19  Georgette Shell, MD  docusate sodium (COLACE) 100 MG capsule Take 100 mg by mouth 2 (two) times daily.    [provider]  lidocaine-prilocaine (EMLA) cream Apply to affected area once 02/28/18   [name], MD  Multiple Vitamin (MULTIVITAMIN WITH MINERALS) TABS tablet Take 1 tablet by mouth daily.    [provider]  Multiple Vitamins-Iron (MULTIVITAMIN/IRON PO) Take 1 tablet by mouth 2 (two) times daily.    [provider]  ondansetron (ZOFRAN) 8 MG tablet Take 1 tablet (8 mg total) by mouth 2 (two) times daily as needed for refractory nausea / vomiting.  Start on day 3 after chemotherapy. 02/28/18   [name], MD  prochlorperazine (COMPAZINE) 10 MG tablet Take 1 tablet (10 mg total) by mouth every 6 (six) hours as needed (Nausea or vomiting). 02/28/18   [name], MD  tamsulosin (FLOMAX) 0.4 MG CAPS capsule Take 0.4 mg by mouth.    [provider]    Family History Family History  Problem Relation Age of Onset  . Breast cancer Mother 14       metastatin; recurrence x7  . Heart attack Father 34  . Cancer Daughter        melanoma, leukemia    Social History Social History   Tobacco Use  . Smoking status: Never Smoker  . Smokeless tobacco: Never Used  Substance Use Topics  . Alcohol use: Yes    Comment: drinks socially about 2x/week  . Drug use: Never     Allergies   Patient has no known allergies.   Review of Systems Review of Systems  Constitutional: Positive for fever. Negative for chills and fatigue.  HENT: Negative for congestion and rhinorrhea.   Eyes: Negative for visual disturbance.  Respiratory: Negative for cough, shortness of breath and wheezing.   Cardiovascular: Negative for chest pain and leg swelling.  Gastrointestinal: Negative for abdominal pain, diarrhea, nausea and vomiting.  Genitourinary: Positive for decreased urine volume, difficulty urinating and dysuria. Negative for flank pain.  Musculoskeletal: Negative for neck pain and neck stiffness.  Skin: Negative for rash and wound.  Allergic/Immunologic: Negative for immunocompromised state.  Neurological: Negative for syncope, weakness and headaches.  All other systems reviewed and are negative.    Physical Exam Updated Vital Signs BP 123/74   Pulse 86   Temp 98.4 F (36.9 C) (Oral)   Ht 5\' 10"  (1.778 m)   Wt 93 kg   SpO2 96%   BMI 29.41 kg/m   Physical Exam  Constitutional: He is oriented to person, place, and time. He appears well-developed and well-nourished. No distress.  HENT:  Head: Normocephalic and atraumatic.  Eyes:  Conjunctivae are normal.  Neck: Neck supple.  Cardiovascular: Normal rate, regular rhythm and normal heart sounds. Exam reveals no friction rub.  No murmur heard. Pulmonary/Chest: Effort normal and breath sounds normal. No respiratory distress. He has no wheezes. He has no rales.  Abdominal: Soft. Bowel sounds are normal. He exhibits no distension.  Midline surgical scar is healing, no surrounding cellulitis.  Left-sided ostomy in place with brown stool.  No overt tenderness.  No rebound or guarding.  Genitourinary:  Genitourinary Comments: No testicular swelling.  No penile lesions.  Musculoskeletal: He exhibits no edema.  Neurological: He is alert and oriented to person, place, and time. He exhibits normal muscle tone.  Skin: Skin is warm. Capillary refill takes less than 2  seconds.  Psychiatric: He has a normal mood and affect.  Nursing note and vitals reviewed.    ED Treatments / Results  Labs (all labs ordered are listed, but only abnormal results are displayed) Labs Reviewed  URINALYSIS, ROUTINE W REFLEX MICROSCOPIC - Abnormal; Notable for the following components:      Result Value   Color, Urine ORANGE (*)    APPearance CLOUDY (*)    Hgb urine dipstick MODERATE (*)    Protein, ur 30 (*)    Nitrite POSITIVE (*)    Leukocytes, UA SMALL (*)    All other components within normal limits  URINALYSIS, MICROSCOPIC (REFLEX) - Abnormal; Notable for the following components:   Bacteria, UA MANY (*)    All other components within normal limits  CBC WITH DIFFERENTIAL/PLATELET - Abnormal; Notable for the following components:   WBC 16.0 (*)    RBC 3.87 (*)    Hemoglobin 11.2 (*)    HCT 35.5 (*)    RDW 18.3 (*)    Neutro Abs 12.4 (*)    Monocytes Absolute 1.2 (*)    All other components within normal limits  COMPREHENSIVE METABOLIC PANEL - Abnormal; Notable for the following components:   Glucose, Bld 132 (*)    Creatinine, Ser 1.25 (*)    GFR calc non Af Amer 56 (*)    All  other components within normal limits  CULTURE, BLOOD (ROUTINE X 2)  CULTURE, BLOOD (ROUTINE X 2)  URINE CULTURE  I-STAT CG4 LACTIC ACID, ED    EKG None  Radiology Ct Abdomen Pelvis W Contrast  Result Date: 03/19/2018 CLINICAL DATA:  Dysuria. Frequency. Hematuria. Colectomy for colon cancer 9 weeks ago. Status post 1 round of chemotherapy. EXAM: CT ABDOMEN AND PELVIS WITH CONTRAST TECHNIQUE: Multidetector CT imaging of the abdomen and pelvis was performed using the standard protocol following bolus administration of intravenous contrast. CONTRAST:  126mL ISOVUE-300 IOPAMIDOL (ISOVUE-300) INJECTION 61% COMPARISON:  01/10/2018 FINDINGS: Lower chest: Clear lung bases. Normal heart size without pericardial or pleural effusion. Hepatobiliary: Suspected too small to characterize 4 mm right hepatic lobe hypoattenuating lesion on image 12/2. This is present back in 2007 and can be presumed benign. Increased density within the gallbladder is likely due to stones or sludge. No acute inflammation or biliary duct dilatation. Pancreas: Normal, without mass or ductal dilatation. Spleen: Normal in size, without focal abnormality. Adrenals/Urinary Tract: Normal adrenal glands. Exophytic lower pole right renal 2.0 cm lesion measures greater than fluid density and was precontrast hyperattenuating back on 02/26/2014, most consistent with a hemorrhagic/proteinaceous cyst. No hydronephrosis. Equivocal soft tissue fullness at the distal left ureter on image 74/2. This is contiguous with presumed prostatic impression into the urinary bladder. There is a subtle focus of hyperenhancement within the anterior bladder dome which measures 8 mm on image 63/2. Stomach/Bowel: Normal stomach, without wall thickening. Scattered colonic diverticula. Partial left hemicolectomy with descending colostomy. Normal terminal ileum and appendix. Normal small bowel. Vascular/Lymphatic: Aortic and branch vessel atherosclerosis. No abdominopelvic  adenopathy. Reproductive: Moderate prostatomegaly. Other: No significant free fluid.  No free intraperitoneal air. Along the left anterior renal fascia is heterogeneous soft tissue and fat density which measures maximally 2.7 cm on image 37/2. Maximally 5.0 cm craniocaudal on coronal image 51. Interstitial thickening in the left hemipelvis is mild, including adjacent the bladder on image 65/2. Musculoskeletal: No acute osseous abnormality. Lumbosacral spondylosis. IMPRESSION: 1. Interval partial left hemicolectomy and descending colostomy. 2. Heterogeneous soft tissue density along the left anterior renal  fascia is most likely postoperative (favor fat necrosis). No well-defined fluid collection. 3. Mild left lower quadrant edema, new since 01/10/2018. This could be postoperative. Superimposed sigmoid diverticulitis and/or cystitis cannot be excluded. 4. Subtle hyperenhancing nodule within the anterior bladder dome cannot be excluded. Consider nonemergent cystoscopy. When this is performed, recommend attention to the left ureterovesicular junction and distal left ureter to evaluate questionable soft tissue fullness. 5. Cholelithiasis. 6.  Aortic Atherosclerosis (ICD10-I70.0). 7. Prostatomegaly. Electronically Signed   By: Abigail Miyamoto M.D.   On: 03/19/2018 07:20    Procedures Procedures (including critical care time)  Medications Ordered in ED Medications  cefTRIAXone (ROCEPHIN) 2 g in sodium chloride 0.9 % 100 mL IVPB (0 g Intravenous Stopped 03/19/18 0605)  sodium chloride 0.9 % bolus 1,000 mL (0 mLs Intravenous Stopped 03/19/18 0722)  iopamidol (ISOVUE-300) 61 % injection 100 mL (100 mLs Intravenous Contrast Given 03/19/18 0619)  sodium chloride 0.9 % bolus 1,000 mL (0 mLs Intravenous Stopped 03/19/18 0829)     Initial Impression / Assessment and Plan / ED Course  I have reviewed the triage vital signs and the nursing notes.  Pertinent labs & imaging results that were available during my care of  the patient were reviewed by me and considered in my medical decision making (see chart for details).  Clinical Course as of Mar 19 1958  Nancy Fetter Mar 19, 2018  0628 Abs Immature Granulocytes: 0.05 [CI]    Clinical Course User Index [CI] Duffy Bruce, MD    72 yo M with PMHx colon CA s/p hemicolectomy, with recent initiation of chemo, here with dysuria, transient fever, fatigue. Symptoms, UA is c/w recurrent UTI which I suspect is due to his BPH, possibly worsened by retention in setting of recent sedation for port placement. Pt is AF here, well appearing and non-toxic. Will start IV ABX, fluids. Regarding his recurrent UTI - he has not had abdominal imaging since surgery, and this is now his second UTI. Given his recurrent sx and extensive surgery history, will check CT to eval for deep abscess/fistula. If pt remains well appearing, tolerating PO, AF here, with no neutropenia on CBC, would be reasonable to tx as outpatient with ABX. He does have some mild retention but declines foley at this time - would prefer to attempt management w/o.  Patient care transferred to Dr. Ronnald Nian at the end of my shift. Patient presentation, ED course, and plan of care discussed with review of all pertinent labs and imaging. Please see his/her note for further details regarding further ED course and disposition.   Final Clinical Impressions(s) / ED Diagnoses   Final diagnoses:  Lower urinary tract infectious disease  Cystitis    ED Discharge Orders         Ordered    cefpodoxime (VANTIN) 200 MG tablet  2 times daily     03/19/18 0709           Duffy Bruce, MD 03/19/18 Patrecia Pour    Duffy Bruce, MD 03/19/18 2000

## 2018-03-19 NOTE — ED Notes (Signed)
Waiting on labwork prior to CT per protocol

## 2018-03-19 NOTE — ED Provider Notes (Signed)
Received patient handout from Dr. Ellender Hose at 7 AM.  Patient with history of colon cancer with recent colectomy who presents the ED with painful urination.  Appears to have urinary tract infection.  No signs to suggest sepsis.  No tachycardia, no fever.  Has elevated white count but is on chemotherapy.  Patient is not neutropenic.  Lactic acid within normal limits.  Urinalysis consistent with infection.  Patient given IV antibiotics.  Cultures were collected.  Patient has CT abdomen pelvis pending to rule out any abscess or other intra-abdominal source for infection.  Patient hemodynamically stable.  CT scan shows abscess or surgical needs at this time.  Scan shows signs consistent with urinary tract infection.  Patient has no abdominal pain on exam. No concern for diverticular disease. Patient does have new soft tissue lesion within the bladder/left uvj and was made aware.  He has a urologist and has an appointment in the next several weeks.  Recommend that he follow-up sooner if able for cystoscopy.  This could be concerning for cancer as well given his history.  Patient given a prescription for Vantin.  Given return precautions.  Discharged in ED good condition.   Lennice Sites, DO 03/19/18 [phone]

## 2018-03-19 NOTE — ED Notes (Signed)
Patient tolerated foley cath insertion.  No difficulty noted during cath insertion.  Perineal care done prior to f/c insertion.

## 2018-03-19 NOTE — ED Provider Notes (Signed)
[facility] EMERGENCY DEPARTMENT Provider Note   CSN: [id number] Arrival date & time: 03/19/18  1817     History   Chief Complaint Chief Complaint  Patient presents with  . Urinary Retention    HPI REMIEL CORTI is a 72 y.o. male.  Patient was seen here earlier today for urinary tract infection.  He was sent home with prescription for Vantin and received intravenous Rocephin here.  After returning home this afternoon he developed feeling of urinary urgency and could not urinate.  He denies any nausea or vomiting.  No other associated symptoms.  He has had urinary retention in the past, treated with Foley catheter.  No other associated symptoms.  No treatment prior to coming here.  He states he feels much improved since Foley catheter inserted prior to my entering the room  HPI  Past Medical History:  Diagnosis Date  . 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 Anemia    taking iron supplements  . Atrial fibrillation with RVR ([place])   . Diverticulitis   . Dysrhythmia    afib  . History of kidney stones   . Kidney stones     Patient Active Problem List   Diagnosis Date Noted  . Cancer of left colon ([place]) 01/16/2018  . Protein-calorie malnutrition, severe 01/14/2018  . New onset a-fib ([place]) 01/11/2018  . Colonic obstruction ([place]) 01/10/2018  . Bowel obstruction ([place]) 01/10/2018    Past Surgical History:  Procedure Laterality Date  . ANKLE SURGERY Left    when he was in college  . COLON RESECTION N/A 01/11/2018   Procedure: LEFT COLON RESECTION, TAKEDOWN SPLENIC FLEXURE, COLOSTOMY;  Surgeon: [name], MD;  Location: [facility];  Service: General;  Laterality: N/A;  . COLONOSCOPY  01/11/2018   Procedure: COLONOSCOPY;  Surgeon: Jackquline Denmark, MD;  Location: [facility];  Service: Endoscopy;;  . PORTACATH PLACEMENT Right 03/07/2018   Procedure: INSERTION PORT-A-CATH RIGHT SUBCLAVIAN;  Surgeon: [name], MD;  Location: [facility];  Service: General;  Laterality: Right;  .  thumb surgery   2018   cyst removal        Home Medications    Prior to Admission medications   Medication Sig Start Date End Date Taking? Authorizing Provider  acetaminophen (TYLENOL) 500 MG tablet Take 1,000 mg by mouth every 6 (six) hours as needed (for pain.).    [provider]  ALPRAZolam Duanne Moron) 0.25 MG tablet Take 1 tablet (0.25 mg total) by mouth at bedtime as needed for anxiety. 03/14/18   [name], MD  amiodarone (PACERONE) 200 MG tablet Take 1 tablet (200 mg total) by mouth 2 (two) times daily. 01/17/18   Georgette Shell, MD  apixaban (ELIQUIS) 5 MG TABS tablet Take 1 tablet (5 mg total) by mouth 2 (two) times daily. 01/17/18   Georgette Shell, MD  cefpodoxime (VANTIN) 200 MG tablet Take 1 tablet (200 mg total) by mouth 2 (two) times daily for 10 days. 03/19/18 03/29/18  Duffy Bruce, MD  diltiazem (CARTIA XT) 240 MG 24 hr capsule Take 1 capsule (240 mg total) by mouth daily. 01/17/18 01/17/19  Georgette Shell, MD  docusate sodium (COLACE) 100 MG capsule Take 100 mg by mouth 2 (two) times daily.    [provider]  lidocaine-prilocaine (EMLA) cream Apply to affected area once 02/28/18   [name], MD  Multiple Vitamin (MULTIVITAMIN WITH MINERALS) TABS tablet Take 1 tablet by mouth daily.    [provider]  Multiple Vitamins-Iron (  MULTIVITAMIN/IRON PO) Take 1 tablet by mouth 2 (two) times daily.    [provider]  ondansetron (ZOFRAN) 8 MG tablet Take 1 tablet (8 mg total) by mouth 2 (two) times daily as needed for refractory nausea / vomiting. Start on day 3 after chemotherapy. 02/28/18   [name], MD  prochlorperazine (COMPAZINE) 10 MG tablet Take 1 tablet (10 mg total) by mouth every 6 (six) hours as needed (Nausea or vomiting). 02/28/18   [name], MD  tamsulosin (FLOMAX) 0.4 MG CAPS capsule Take 0.4 mg by mouth.    [provider]    Family History Family History  Problem Relation Age of Onset  . Breast cancer  Mother 22       metastatin; recurrence x7  . Heart attack Father 11  . Cancer Daughter        melanoma, leukemia    Social History Social History   Tobacco Use  . Smoking status: Never Smoker  . Smokeless tobacco: Never Used  Substance Use Topics  . Alcohol use: Yes    Comment: drinks socially about 2x/week  . Drug use: Never     Allergies   Patient has no known allergies.   Review of Systems Review of Systems  Gastrointestinal:       Colosyomy in place  Genitourinary: Positive for urgency.  Skin: Positive for wound.       Surgical wound on abdomen  Allergic/Immunologic: Positive for immunocompromised state.       Cancer patient  All other systems reviewed and are negative.    Physical Exam Updated Vital Signs BP 118/72 (BP Location: Right Arm)   Pulse 97   Temp 98.7 F (37.1 C) (Oral)   Resp 18   SpO2 96%   Physical Exam  Constitutional: He appears well-developed and well-nourished.  HENT:  Head: Normocephalic and atraumatic.  Eyes: Pupils are equal, round, and reactive to light. Conjunctivae are normal.  Neck: Neck supple. No tracheal deviation present. No thyromegaly present.  Cardiovascular: Normal rate and regular rhythm.  No murmur heard. Pulmonary/Chest: Effort normal and breath sounds normal.  Abdominal: Soft. Bowel sounds are normal. He exhibits no distension. There is no tenderness.  Left-sided colostomy  Genitourinary: Penis normal.  Genitourinary Comments: Foley catheter in place  Musculoskeletal: Normal range of motion. He exhibits no edema or tenderness.  Neurological: He is alert. Coordination normal.  Skin: Skin is warm and dry. No rash noted.  Psychiatric: He has a normal mood and affect.  Nursing note and vitals reviewed.    ED Treatments / Results  Labs (all labs ordered are listed, but only abnormal results are displayed) Labs Reviewed - No data to display  EKG None  Radiology Ct Abdomen Pelvis W Contrast  Result Date:  03/19/2018 CLINICAL DATA:  Dysuria. Frequency. Hematuria. Colectomy for colon cancer 9 weeks ago. Status post 1 round of chemotherapy. EXAM: CT ABDOMEN AND PELVIS WITH CONTRAST TECHNIQUE: Multidetector CT imaging of the abdomen and pelvis was performed using the standard protocol following bolus administration of intravenous contrast. CONTRAST:  121mL ISOVUE-300 IOPAMIDOL (ISOVUE-300) INJECTION 61% COMPARISON:  01/10/2018 FINDINGS: Lower chest: Clear lung bases. Normal heart size without pericardial or pleural effusion. Hepatobiliary: Suspected too small to characterize 4 mm right hepatic lobe hypoattenuating lesion on image 12/2. This is present back in 2007 and can be presumed benign. Increased density within the gallbladder is likely due to stones or sludge. No acute inflammation or biliary duct dilatation. Pancreas: Normal, without mass or ductal  dilatation. Spleen: Normal in size, without focal abnormality. Adrenals/Urinary Tract: Normal adrenal glands. Exophytic lower pole right renal 2.0 cm lesion measures greater than fluid density and was precontrast hyperattenuating back on 02/26/2014, most consistent with a hemorrhagic/proteinaceous cyst. No hydronephrosis. Equivocal soft tissue fullness at the distal left ureter on image 74/2. This is contiguous with presumed prostatic impression into the urinary bladder. There is a subtle focus of hyperenhancement within the anterior bladder dome which measures 8 mm on image 63/2. Stomach/Bowel: Normal stomach, without wall thickening. Scattered colonic diverticula. Partial left hemicolectomy with descending colostomy. Normal terminal ileum and appendix. Normal small bowel. Vascular/Lymphatic: Aortic and branch vessel atherosclerosis. No abdominopelvic adenopathy. Reproductive: Moderate prostatomegaly. Other: No significant free fluid.  No free intraperitoneal air. Along the left anterior renal fascia is heterogeneous soft tissue and fat density which measures  maximally 2.7 cm on image 37/2. Maximally 5.0 cm craniocaudal on coronal image 51. Interstitial thickening in the left hemipelvis is mild, including adjacent the bladder on image 65/2. Musculoskeletal: No acute osseous abnormality. Lumbosacral spondylosis. IMPRESSION: 1. Interval partial left hemicolectomy and descending colostomy. 2. Heterogeneous soft tissue density along the left anterior renal fascia is most likely postoperative (favor fat necrosis). No well-defined fluid collection. 3. Mild left lower quadrant edema, new since 01/10/2018. This could be postoperative. Superimposed sigmoid diverticulitis and/or cystitis cannot be excluded. 4. Subtle hyperenhancing nodule within the anterior bladder dome cannot be excluded. Consider nonemergent cystoscopy. When this is performed, recommend attention to the left ureterovesicular junction and distal left ureter to evaluate questionable soft tissue fullness. 5. Cholelithiasis. 6.  Aortic Atherosclerosis (ICD10-I70.0). 7. Prostatomegaly. Electronically Signed   By: Abigail Miyamoto M.D.   On: 03/19/2018 07:20    Procedures Procedures (including critical care time)  Medications Ordered in ED Medications - No data to display   Initial Impression / Assessment and Plan / ED Course  I have reviewed the triage vital signs and the nursing notes.  Pertinent labs & imaging results that were available during my care of the patient were reviewed by me and considered in my medical decision making (see chart for details).     She had extensive work-up this morning including.  Urine culture pending Foley catheter was inserted.  Bladder scan prior to Foley insertion showed 800 mL of retained urine.  Feels much improved and ready for discharge after insertion of Foley plan follow-up with Dr. Lovena Neighbours, urologist this week Final Clinical Impressions(s) / ED Diagnoses   Final diagnoses:  Urinary retention    ED Discharge Orders    None       Orlie Dakin,  MD 03/19/18 1931

## 2018-03-19 NOTE — ED Triage Notes (Signed)
Pt is also 9 weeks status post hemicolectomy and has started chemotherapy for colon cancer

## 2018-03-19 NOTE — Discharge Instructions (Addendum)
Take the antibiotic as prescribed  If you have difficulty urinating, return to the ER  Return immediately with any fever, nausea/vomiting, or worsening symptoms

## 2018-03-19 NOTE — ED Triage Notes (Signed)
Pt was treated here for a UTI this morning. Since leaving he has been unable to urinate. He is taking his medications that were prescribed today. He has not had urine output since THis AM before lunch.

## 2018-03-19 NOTE — Discharge Instructions (Signed)
Call Dr. Gilford Rile office tomorrow to schedule an office visit for within a week.  Tell office staff that you were seen here and had a Foley catheter placed

## 2018-03-19 NOTE — ED Notes (Signed)
Attempted to obtain vs. Pt is trying to use the urinal.

## 2018-03-20 ENCOUNTER — Telehealth: Payer: Self-pay

## 2018-03-20 NOTE — Telephone Encounter (Signed)
Spoke with patient's wife, took to the ED yesterday, had 800 ml of urine in bladder, left catheter in x 1 weeks, treating for UTI (vantin). Afebrile since yesterday.  Instructed her to keep his appointment on Wednesday so we can evaluate him.  Can always postpone chemo if necessary.  She verbalized an understanding.

## 2018-03-21 ENCOUNTER — Encounter (HOSPITAL_BASED_OUTPATIENT_CLINIC_OR_DEPARTMENT_OTHER): Payer: Self-pay | Admitting: Emergency Medicine

## 2018-03-21 ENCOUNTER — Other Ambulatory Visit: Payer: Self-pay | Admitting: Nurse Practitioner

## 2018-03-21 ENCOUNTER — Emergency Department (HOSPITAL_BASED_OUTPATIENT_CLINIC_OR_DEPARTMENT_OTHER)
Admission: EM | Admit: 2018-03-21 | Discharge: 2018-03-21 | Disposition: A | Discharge status: Home / Self Care | Payer: 59 | Attending: Emergency Medicine | Admitting: Emergency Medicine

## 2018-03-21 ENCOUNTER — Other Ambulatory Visit: Payer: Self-pay

## 2018-03-21 DIAGNOSIS — T839XXA Unspecified complication of genitourinary prosthetic device, implant and graft, initial encounter: Secondary | ICD-10-CM

## 2018-03-21 DIAGNOSIS — Z7901 Long term (current) use of anticoagulants: Secondary | ICD-10-CM | POA: Insufficient documentation

## 2018-03-21 DIAGNOSIS — C186 Malignant neoplasm of descending colon: Secondary | ICD-10-CM

## 2018-03-21 DIAGNOSIS — Y69 Unspecified misadventure during surgical and medical care: Secondary | ICD-10-CM | POA: Diagnosis not present

## 2018-03-21 DIAGNOSIS — Z79899 Other long term (current) drug therapy: Secondary | ICD-10-CM | POA: Insufficient documentation

## 2018-03-21 DIAGNOSIS — I4891 Unspecified atrial fibrillation: Secondary | ICD-10-CM | POA: Insufficient documentation

## 2018-03-21 DIAGNOSIS — T83098A Other mechanical complication of other indwelling urethral catheter, initial encounter: Secondary | ICD-10-CM | POA: Insufficient documentation

## 2018-03-21 LAB — URINE CULTURE: Culture: >=100000 — AB

## 2018-03-21 NOTE — ED Triage Notes (Signed)
Pt states he was seen here on Sunday and had a catheter placed  Pt states since then the catheter has been painful and he feels like he has to push to get the urine to come out

## 2018-03-21 NOTE — Discharge Instructions (Addendum)
Continue taking your antibiotic as prescribed.  Try to empty your bladder every hour for the next 4 hours.  Return if you find you are unable to urinate.

## 2018-03-21 NOTE — ED Provider Notes (Signed)
[facility] Provider Note   CSN: [id number] Arrival date & time: 03/21/18  0458     History   Chief Complaint Chief Complaint  Patient presents with  . painful urinary catheter    HPI [facility] is a 72 y.o. male.  The history is provided by the patient.  He has history of colon cancer, atrial fibrillation, kidney stones and comes in because of pain from his Foley catheter.  He was in the emergency [facility] 2 days ago for a urinary tract infection and started on antibiotics, returned later that day with urinary retention and had Foley catheter placed.  He states that he feels that the Foley catheter is irritating his penis.  He has a sense that he has to urinate all the time.  He has a sense of pressure building up and, when he bears down, and will sometimes ease off.  He denies fever or chills.  Catheter has been draining appropriately.  Past Medical History:  Diagnosis Date  . 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 Anemia    taking iron supplements  . Atrial fibrillation with RVR ([place])   . Diverticulitis   . Dysrhythmia    afib  . History of kidney stones   . Kidney stones     Patient Active Problem List   Diagnosis Date Noted  . Cancer of left colon ([place]) 01/16/2018  . Protein-calorie malnutrition, severe 01/14/2018  . New onset a-fib ([place]) 01/11/2018  . Colonic obstruction ([place]) 01/10/2018  . Bowel obstruction ([place]) 01/10/2018    Past Surgical History:  Procedure Laterality Date  . ANKLE SURGERY Left    when he was in college  . COLON RESECTION N/A 01/11/2018   Procedure: LEFT COLON RESECTION, TAKEDOWN SPLENIC FLEXURE, COLOSTOMY;  Surgeon: [name], MD;  [facility];  Service: General;  Laterality: N/A;  . COLONOSCOPY  01/11/2018   Procedure: COLONOSCOPY;  Surgeon: Jackquline Denmark, MD;  [facility];  Service: Endoscopy;;  . PORTACATH PLACEMENT Right 03/07/2018   Procedure: INSERTION PORT-A-CATH RIGHT SUBCLAVIAN;   Surgeon: [name], MD;  Location: [facility];  Service: General;  Laterality: Right;  . thumb surgery   2018   cyst removal        Home Medications    Prior to Admission medications   Medication Sig Start Date End Date Taking? Authorizing Provider  acetaminophen (TYLENOL) 500 MG tablet Take 1,000 mg by mouth every 6 (six) hours as needed (for pain.).    [provider]  ALPRAZolam Duanne Moron) 0.25 MG tablet Take 1 tablet (0.25 mg total) by mouth at bedtime as needed for anxiety. 03/14/18   [name], MD  amiodarone (PACERONE) 200 MG tablet Take 1 tablet (200 mg total) by mouth 2 (two) times daily. 01/17/18   Georgette Shell, MD  apixaban (ELIQUIS) 5 MG TABS tablet Take 1 tablet (5 mg total) by mouth 2 (two) times daily. 01/17/18   Georgette Shell, MD  cefpodoxime (VANTIN) 200 MG tablet Take 1 tablet (200 mg total) by mouth 2 (two) times daily for 10 days. 03/19/18 03/29/18  Duffy Bruce, MD  diltiazem (CARTIA XT) 240 MG 24 hr capsule Take 1 capsule (240 mg total) by mouth daily. 01/17/18 01/17/19  Georgette Shell, MD  docusate sodium (COLACE) 100 MG capsule Take 100 mg by mouth 2 (two) times daily.    [provider]  lidocaine-prilocaine (EMLA) cream Apply to affected area once 02/28/18   [name], MD  Multiple Vitamin (MULTIVITAMIN WITH MINERALS) TABS tablet Take 1 tablet by mouth daily.    [provider]  Multiple Vitamins-Iron (MULTIVITAMIN/IRON PO) Take 1 tablet by mouth 2 (two) times daily.    [provider]  ondansetron (ZOFRAN) 8 MG tablet Take 1 tablet (8 mg total) by mouth 2 (two) times daily as needed for refractory nausea / vomiting. Start on day 3 after chemotherapy. 02/28/18   [name], MD  prochlorperazine (COMPAZINE) 10 MG tablet Take 1 tablet (10 mg total) by mouth every 6 (six) hours as needed (Nausea or vomiting). 02/28/18   [name], MD  tamsulosin (FLOMAX) 0.4 MG CAPS capsule Take 0.4 mg by mouth.    [provider]    Family History Family History  Problem Relation Age of Onset  . Breast cancer Mother 52       metastatin; recurrence x7  . Heart attack Father 35  . Cancer Daughter        melanoma, leukemia    Social History Social History   Tobacco Use  . Smoking status: Never Smoker  . Smokeless tobacco: Never Used  Substance Use Topics  . Alcohol use: Yes    Comment: drinks socially about 2x/week  . Drug use: Never     Allergies   Patient has no known allergies.   Review of Systems Review of Systems  All other systems reviewed and are negative.    Physical Exam Updated Vital Signs BP (!) 128/102 (BP Location: Right Arm)   Pulse 94   Temp 98.9 F (37.2 C) (Oral)   Resp 18   Ht 5\' 10"  (1.778 m)   Wt 93 kg   SpO2 96%   BMI 29.41 kg/m   Physical Exam  Nursing note and vitals reviewed.  72 year old male, resting comfortably and in no acute distress. Vital signs are significant for elevated diastolic blood pressure. Oxygen saturation is 96%, which is normal. Head is normocephalic and atraumatic. PERRLA, EOMI. Oropharynx is clear. Neck is nontender and supple without adenopathy or JVD. Back is nontender and there is no CVA tenderness. Lungs are clear without rales, wheezes, or rhonchi. Chest is nontender. Heart has regular rate and rhythm without murmur. Abdomen is soft, flat, nontender without masses or hepatosplenomegaly and peristalsis is normoactive.  Colostomy present left lower quadrant. Extremities have no cyanosis or edema, full range of motion is present. Skin is warm and dry without rash. Neurologic: Mental status is normal, cranial nerves are intact, there are no motor or sensory deficits.  ED Treatments / Results   Procedures Procedures  Medications Ordered in ED Medications - No data to display   Initial Impression / Assessment and Plan / ED Course  I have reviewed the triage vital signs and the nursing notes.  Indwelling Foley catheter with  bladder spasms.  Old records are reviewed confirming ED visit 2 days ago with placement of Foley catheter.  Urine culture has grown greater than 100,000 colonies of Klebsiella, but sensitivities are pending.  Patient was offered options of getting medication to treat bladder spasms versus trial of removal of Foley catheter.  Since he has been on antibiotics for 2 days, there is a good chance he would be able to tolerate this.  Of this is the option patient has elected.  Risk of recurrent urinary retention with need for repeat ED visit and replacement of Foley catheter was explained and patient expressed understanding.  Foley catheter is removed.  He is encouraged to  try to urinate every hour for the next  4hours.  He has an appointment with his urologist for October 21.  He is to keep that appointment.  Final Clinical Impressions(s) / ED Diagnoses   Final diagnoses:  Complication of Foley catheter, initial encounter [facility])    ED Discharge Orders    None       Delora Fuel, MD 69/45/03 [phone]

## 2018-03-21 NOTE — Telephone Encounter (Signed)
OK thanks.  Regan Rakers

## 2018-03-21 NOTE — Progress Notes (Signed)
[place]  [phone] Fax:([phone]) [phone]  Clinic Follow up Note   Patient Care Team: [name], MD as PCP - General (Family Medicine) [name], MD as Consulting Physician (Cardiology) [name], MD as Consulting Physician (Gastroenterology) 03/22/2018  SUMMARY OF ONCOLOGIC HISTORY: Oncology History   Cancer Staging Cancer of left colon [facility]) Staging form: Colon and Rectum, AJCC 8th Edition - Pathologic stage from 01/11/2018: Stage IIIB (pT3, pN1c, cM0) - Signed by [name], MD on 01/16/2018       Cancer of left colon ([place])   01/10/2018 Imaging    CT AP W Contrast 01/10/18  IMPRESSION: Irregular soft tissue density causing stricture of the mid descending colon likely the site of obstruction for the dilated small bowel. 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 No evidence of perforation.  Equivocal findings involving the appendix measuring 1.2 cm at the appendiceal tip with mucosal enhancement. No adjacent free fluid or inflammatory change. Findings are nonspecific, but can be seen with early acute appendicitis.  Mild prostatic enlargement. Increased density over the posterior bladder base likely due to the large prostatic impression although cannot completely exclude a bladder mass. Urology protocol CT or ultrasound may be helpful for better evaluation.  Mild cholelithiasis.  Stable 1.5 cm cystic structure over the lower pole right kidney likely slightly hyperdense cyst.  Diverticulosis of the colon.  Aortic Atherosclerosis (ICD10-I70.0).      01/11/2018 Cancer Staging    Staging form: Colon and Rectum, AJCC 8th Edition - Pathologic stage from 01/11/2018: Stage IIIB (pT3, pN1c, cM0) - Signed by [name], MD on 01/16/2018    01/11/2018 Surgery    LEFT COLON RESECTION, TAKEDOWN SPLENIC FLEXURE, COLOSTOMY by Dr. [name]     01/11/2018 Procedure    Colonoscopy 01/11/18 by Dr. [name]  - Malignant completely obstructing  tumor in the mid descending colon. Tattooed. - Diverticulosis in the sigmoid colon. - Non-bleeding internal hemorrhoids. - No specimens collected.    01/11/2018 Pathology Results    Diagnosis 01/11/18  1. Colon, segmental resection for tumor, descending colon - INVASIVE COLORECTAL ADENOCARCINOMA, 4 CM. - TUMOR EXTENDS INTO PERICOLONIC CONNECTIVE TISSUE. - TUMOR FOCALLY INVOLVES RADIAL MARGIN. - ONE MESENTERIC TUMOR DEPOSIT. - THIRTEEN BENIGN LYMPH NODES (0/13). 2. Colon, segmental resection, splenic flexure - BENIGN COLON. - NO EVIDENCE OF MALIGNANCY .    01/11/2018 Tumor Marker    Baseline CEA at 3.4    01/16/2018 Initial Diagnosis    Cancer of left colon ([place])    01/23/2018 Imaging    CT CHEST WO CONTRAST IMPRESSION: 1. No evidence for metastatic disease within the chest. 2. Small left pleural effusion with underlying opacities which may represent atelectasis. Right basilar atelectasis. 3. Few foci of gas within the upper abdomen in the omentum with surrounding fat stranding, likely postsurgical 4. Aortic Atherosclerosis (ICD10-I70.0).    03/07/2018 -  Chemotherapy    The patient had palonosetron (ALOXI) injection 0.25 mg, 0.25 mg, Intravenous,  Once, 2 of 12 cycles Administration: 0.25 mg (03/08/2018) leucovorin 856 mg in dextrose 5 % 250 mL infusion, 400 mg/m2 = 856 mg, Intravenous,  Once, 2 of 12 cycles Administration: 856 mg (03/08/2018) oxaliplatin (ELOXATIN) 180 mg in dextrose 5 % 500 mL chemo infusion, 85 mg/m2 = 180 mg, Intravenous,  Once, 2 of 12 cycles Administration: 180 mg (03/08/2018) fluorouracil (ADRUCIL) chemo injection 850 mg, 400 mg/m2 = 850 mg, Intravenous,  Once, 2 of 12 cycles Administration: 850 mg (03/08/2018) fluorouracil (ADRUCIL) 5,150 mg in sodium  chloride 0.9 % 147 mL chemo infusion, 2,400 mg/m2 = 5,150 mg, Intravenous, 1 Day/Dose, 2 of 12 cycles Administration: 5,150 mg (03/08/2018)  for chemotherapy treatment.    CURRENT THERAPY: adjuvant FOLFOX q2  weeks x6 months, beginning 03/08/18   INTERVAL HISTORY: Mr. Schnepf returns for follow up and cycle 2 FOLFOX as scheduled. He completed cycle 1 on 10/2. He was fatigued on days 3 and 4 but remained functional. Appetite never decreased. He had mild nausea without vomiting for 1 day, takes preventative anti-emetic BID. Stool output is normal, no constipation or diarrhea; takes stool softener daily. Empties bag 3 times daily. Continues to change abdominal dressing BID, has been discharged from home health. Wife presents a picture today and she measures invision 9 x2 and very shallow, only requires 1 moist gauze. He will see Dr. [name] on 11/20 for f/u and to discuss colostomy reversal. He denies cold sensitivity, neuropathy, mucositis, or abdominal pain. Denies cough, chest pain, or dyspnea. Leg edema is nearly resolved, he elevates legs and uses compression stockings.   Of note, he had UTI and has been to ED frequently for indwelling foley issues and pain. It was removed and replaced frequently. He had fever and chills with onset of UTI symptoms but none since. He is on antibiotics. Was seen by urologist NP today who replaced foley which will stay in for 2 weeks, and continues him on antibiotics for 10 days. Denies hematuria. Urine is draining without difficulty. Foley is uncomfortable but not painful.    MEDICAL HISTORY:  Past Medical History:  Diagnosis Date  . Adenocarcinoma, colon ([place])   . Anemia    taking iron supplements  . Atrial fibrillation with RVR ([place])   . Diverticulitis   . Dysrhythmia    afib  . History of kidney stones   . Kidney stones     SURGICAL HISTORY: Past Surgical History:  Procedure Laterality Date  . ANKLE SURGERY Left    when he was in college  . COLON RESECTION N/A 01/11/2018   Procedure: LEFT COLON RESECTION, TAKEDOWN SPLENIC FLEXURE, COLOSTOMY;  Surgeon: [name], MD;  Location: [facility];  Service: General;  Laterality: N/A;  . COLONOSCOPY  01/11/2018    Procedure: COLONOSCOPY;  Surgeon: Jackquline Denmark, MD;  Location: [facility];  Service: Endoscopy;;  . PORTACATH PLACEMENT Right 03/07/2018   Procedure: INSERTION PORT-A-CATH RIGHT SUBCLAVIAN;  Surgeon: [name], MD;  Location: [place];  Service: General;  Laterality: Right;  . thumb surgery   2018   cyst removal    I have reviewed the social history and family history with the patient and they are unchanged from previous note.  ALLERGIES:  has No Known Allergies.  MEDICATIONS:  Current Outpatient Medications  Medication Sig Dispense Refill  . acetaminophen (TYLENOL) 500 MG tablet Take 1,000 mg by mouth every 6 (six) hours as needed (for pain.).    [name] ALPRAZolam (XANAX) 0.25 MG tablet Take 1 tablet (0.25 mg total) by mouth at bedtime as needed for anxiety. 20 tablet 0  . amiodarone (PACERONE) 200 MG tablet Take 1 tablet (200 mg total) by mouth 2 (two) times daily. 60 tablet 0  . apixaban (ELIQUIS) 5 MG TABS tablet Take 1 tablet (5 mg total) by mouth 2 (two) times daily. 60 tablet 0  . cefpodoxime (VANTIN) 200 MG tablet Take 1 tablet (200 mg total) by mouth 2 (two) times daily for 10 days. 20 tablet 0  . diltiazem (CARTIA XT) 240 MG 24 hr capsule Take  1 capsule (240 mg total) by mouth daily. 30 capsule 11  . docusate sodium (COLACE) 100 MG capsule Take 100 mg by mouth 2 (two) times daily.    [name] lidocaine-prilocaine (EMLA) cream Apply to affected area once 30 g 3  . Multiple Vitamin (MULTIVITAMIN WITH MINERALS) TABS tablet Take 1 tablet by mouth daily.    . Multiple Vitamins-Iron (MULTIVITAMIN/IRON PO) Take 1 tablet by mouth 2 (two) times daily.    . ondansetron (ZOFRAN) 8 MG tablet Take 1 tablet (8 mg total) by mouth 2 (two) times daily as needed for refractory nausea / vomiting. Start on day 3 after chemotherapy. 30 tablet 1  . prochlorperazine (COMPAZINE) 10 MG tablet Take 1 tablet (10 mg total) by mouth every 6 (six) hours as needed (Nausea or vomiting). 30 tablet 1  . tamsulosin (FLOMAX) 0.4  MG CAPS capsule Take 0.4 mg by mouth.     No current facility-administered medications for this visit.    Facility-Administered Medications Ordered in Other Visits  Medication Dose Route Frequency Provider Last Rate Last Dose  . fluorouracil (ADRUCIL) 5,150 mg in sodium chloride 0.9 % 147 mL chemo infusion  2,400 mg/m2 (Treatment Plan Recorded) Intravenous 1 day or 1 dose [name], MD      . fluorouracil (ADRUCIL) chemo injection 850 mg  400 mg/m2 (Treatment Plan Recorded) Intravenous Once [name], MD        PHYSICAL EXAMINATION: ECOG PERFORMANCE STATUS: 1 - Symptomatic but completely ambulatory  Vitals:   03/22/18 1150  BP: 130/84  Pulse: 87  Resp: 18  Temp: 98.4 F (36.9 C)  SpO2: 99%   Filed Weights   03/22/18 1150  Weight: 212 lb 6.4 oz (96.3 kg)    GENERAL:alert, no distress and comfortable SKIN: no rashes or significant lesions EYES: sclera clear OROPHARYNX:no thrush or ulcers  LYMPH:  no palpable cervical or supraclavicular lymphadenopathy  LUNGS: clear to auscultation with normal breathing effort HEART: regular rate & rhythm, mild bilateral lower extremity edema ABDOMEN: abdomen soft, non-tender and normal bowel sounds. Left colostomy in place. Midline abdominal incision, dressing c/d/i   Musculoskeletal:no cyanosis of digits and no clubbing  NEURO: alert & oriented x 3 with fluent speech, no focal motor/sensory deficits PAC without erythema    LABORATORY DATA:  I have reviewed the data as listed CBC Latest Ref Rng & Units 03/22/2018 03/19/2018 03/08/2018  WBC 4.0 - 10.5 K/uL 4.4 16.0(H) 14.1(H)  Hemoglobin 13.0 - 17.0 g/dL 10.4(L) 11.2(L) 10.3(L)  Hematocrit 39.0 - 52.0 % 32.7(L) 35.5(L) 30.8(L)  Platelets 150 - 400 K/uL 184 237 373     CMP Latest Ref Rng & Units 03/22/2018 03/19/2018 03/08/2018  Glucose 70 - 99 mg/dL 122(H) 132(H) 184(H)  BUN 8 - 23 mg/dL _0 Creatinine 0.61 - 1.24 mg/dL 1.26(H) 1.25(H) 1.17  Sodium 135 - 145 mmol/L 142 136 143    Potassium 3.5 - 5.1 mmol/L 4.2 3.9 4.1  Chloride 98 - 111 mmol/L 103 99 106  CO2 22 - 32 mmol/L _1 Calcium 8.9 - 10.3 mg/dL 9.5 9.0 9.2  Total Protein 6.5 - 8.1 g/dL 7.4 7.2 6.8  Total Bilirubin 0.3 - 1.2 mg/dL 0.6 1.1 0.3  Alkaline Phos 38 - 126 U/L 137(H) 111 128(H)  AST 15 - 41 U/L _2 ALT 0 - 44 U/L 41 27 13      RADIOGRAPHIC STUDIES: I have personally reviewed the radiological images as listed and agreed with the findings  in the report. No results found.   ASSESSMENT & PLAN: Ehsan Corvin a 72 y.o.Caucasianmalewith a history of diverticulitis who presented to the ED for abdominal pain and was found to havecancer of the descending colon.  1. Cancer of left colon, adenocarcinoma, stage IIIB(pT3N1cM0), MSI-stable -Mr. Isabell appears stable. He completed first cycle adjuvant FOLFOX, he tolerated very well overall, except mild nausea and fatigue. He has recovered well.  -for leg edema, he feels it is nearly resolved. He elevates legs and wears compression stockings  -abdominal wound continues to heal; he will f/u with Dr. [name] 04/2018 for discussion of colostomy reversal. Will plan chemo around surgery when a date is set.  -Labs reviewed, CBC and CMP are stable. Cr slightly increased. Iron studies are adequate -proceed with cycle 2 adjuvant FOLFOX today at current dose  -f/u in 2 weeks with cycle 3   2. AFib, found during hospitalization - on apixaban, amiodarone, diltiazem followed by Dr. [name] -01/12/18 ECHO shows EF of 55-60% with moderate mitral valve regurgitation  3. Anemia -He had a mild anemia after colon surgery.  On 02/28/2018 labs showed Hg on 10.8, worse than before. -iron studies adequate, he takes multivitamin with iron  4. Urinary retention and UTI -He had a ED visit on February 24, 2018, due to the urinary retention and a UTI, he completed antibiotics. -He is also on Flomax now -he has been to ED frequently recently, on 10/13,  10/15, and 10/16 seen initially for urinary retention, UA grew >100,000 colonies klebsiella pneumonia; foley was placed on 10/13, given rocephin IV and po cefpodoxime (vantin) home rx. Presented on 10/15 with pain, foley was d/c'd and eventually replaced on 10/16 per urologist. Foley will remain for 2 weeks, he continues antibiotics.  -I reviewed his risk of recurrent UTI is high with indwelling foley and being on chemotherapy. Will monitor closely. Continue f/u with urology.    PLAN: -Labs reviewed, proceed with cycle 2 adjuvant FOLFOX at current dose -F/u in 2 weeks with cycle 3  -Continue antibiotics and urology f/u for UTI and indwelling foley  All questions were answered. The patient knows to call the clinic with any problems, questions or concerns. No barriers to learning was detected.     Alla Feeling, NP 03/22/18

## 2018-03-22 ENCOUNTER — Inpatient Hospital Stay: Payer: 59

## 2018-03-22 ENCOUNTER — Encounter: Payer: Self-pay | Admitting: Nurse Practitioner

## 2018-03-22 ENCOUNTER — Emergency Department (HOSPITAL_COMMUNITY)
Admission: EM | Admit: 2018-03-22 | Discharge: 2018-03-22 | Disposition: A | Discharge status: Home / Self Care | Payer: 59 | Attending: Emergency Medicine | Admitting: Emergency Medicine

## 2018-03-22 ENCOUNTER — Encounter (HOSPITAL_COMMUNITY): Payer: Self-pay | Admitting: Family Medicine

## 2018-03-22 ENCOUNTER — Inpatient Hospital Stay (HOSPITAL_BASED_OUTPATIENT_CLINIC_OR_DEPARTMENT_OTHER): Payer: 59 | Admitting: Nurse Practitioner

## 2018-03-22 VITALS — BP 130/84 | HR 87 | Temp 98.4°F | Resp 18 | Ht 70.0 in | Wt 212.4 lb

## 2018-03-22 DIAGNOSIS — N390 Urinary tract infection, site not specified: Secondary | ICD-10-CM

## 2018-03-22 DIAGNOSIS — Z7901 Long term (current) use of anticoagulants: Secondary | ICD-10-CM | POA: Diagnosis not present

## 2018-03-22 DIAGNOSIS — R339 Retention of urine, unspecified: Secondary | ICD-10-CM

## 2018-03-22 DIAGNOSIS — Z95828 Presence of other vascular implants and grafts: Secondary | ICD-10-CM | POA: Insufficient documentation

## 2018-03-22 DIAGNOSIS — D490 Neoplasm of unspecified behavior of digestive system: Secondary | ICD-10-CM

## 2018-03-22 DIAGNOSIS — D649 Anemia, unspecified: Secondary | ICD-10-CM | POA: Diagnosis not present

## 2018-03-22 DIAGNOSIS — I4891 Unspecified atrial fibrillation: Secondary | ICD-10-CM | POA: Diagnosis not present

## 2018-03-22 DIAGNOSIS — R338 Other retention of urine: Secondary | ICD-10-CM | POA: Diagnosis not present

## 2018-03-22 DIAGNOSIS — C186 Malignant neoplasm of descending colon: Secondary | ICD-10-CM

## 2018-03-22 DIAGNOSIS — Z79899 Other long term (current) drug therapy: Secondary | ICD-10-CM | POA: Diagnosis not present

## 2018-03-22 DIAGNOSIS — Z5111 Encounter for antineoplastic chemotherapy: Secondary | ICD-10-CM | POA: Diagnosis not present

## 2018-03-22 LAB — CMP (CANCER CENTER ONLY)
ALT: 41 U/L (ref 0–44)
ANION GAP: 11 (ref 5–15)
AST: 26 U/L (ref 15–41)
Albumin: 3.6 g/dL (ref 3.5–5.0)
Alkaline Phosphatase: 137 U/L — ABNORMAL HIGH (ref 38–126)
BUN: 17 mg/dL (ref 8–23)
CHLORIDE: 103 mmol/L (ref 98–111)
CO2: 28 mmol/L (ref 22–32)
Calcium: 9.5 mg/dL (ref 8.9–10.3)
Creatinine: 1.26 mg/dL — ABNORMAL HIGH (ref 0.61–1.24)
GFR, Est AFR Am: >60 mL/min (ref >60)
GFR, Estimated: 56 mL/min — ABNORMAL LOW (ref >60)
Glucose, Bld: 122 mg/dL — ABNORMAL HIGH (ref 70–99)
Potassium: 4.2 mmol/L (ref 3.5–5.1)
Sodium: 142 mmol/L (ref 135–145)
Total Bilirubin: 0.6 mg/dL (ref 0.3–1.2)
Total Protein: 7.4 g/dL (ref 6.5–8.1)

## 2018-03-22 LAB — CBC WITH DIFFERENTIAL (CANCER CENTER ONLY)
Abs Immature Granulocytes: 0.01 10*3/uL (ref 0.00–0.07)
BASOS ABS: 0 10*3/uL (ref 0.0–0.1)
Basophils Relative: 1 %
EOS ABS: 0.1 10*3/uL (ref 0.0–0.5)
Eosinophils Relative: 3 %
HEMATOCRIT: 32.7 % — AB (ref 39.0–52.0)
HEMOGLOBIN: 10.4 g/dL — AB (ref 13.0–17.0)
IMMATURE GRANULOCYTES: 0 %
LYMPHS ABS: 1.8 10*3/uL (ref 0.7–4.0)
LYMPHS PCT: 40 %
MCH: 29.1 pg (ref 26.0–34.0)
MCHC: 31.8 g/dL (ref 30.0–36.0)
MCV: 91.3 fL (ref 80.0–100.0)
Monocytes Absolute: 0.5 10*3/uL (ref 0.1–1.0)
Monocytes Relative: 10 %
NEUTROS ABS: 2 10*3/uL (ref 1.7–7.7)
NEUTROS PCT: 46 %
NRBC: 0 % (ref 0.0–0.2)
Platelet Count: 184 10*3/uL (ref 150–400)
RBC: 3.58 MIL/uL — ABNORMAL LOW (ref 4.22–5.81)
RDW: 17.8 % — ABNORMAL HIGH (ref 11.5–15.5)
WBC Count: 4.4 10*3/uL (ref 4.0–10.5)

## 2018-03-22 LAB — IRON AND TIBC
IRON: 49 ug/dL (ref 42–163)
Saturation Ratios: 17 % — ABNORMAL LOW (ref 42–163)
TIBC: 281 ug/dL (ref 202–409)
UIBC: 233 ug/dL

## 2018-03-22 LAB — FERRITIN: Ferritin: 519 ng/mL — ABNORMAL HIGH (ref 24–336)

## 2018-03-22 MED ORDER — LIDOCAINE HCL URETHRAL/MUCOSAL 2 % EX GEL: 1.0000 "application " | Freq: Once | CUTANEOUS | Status: DC

## 2018-03-22 MED ORDER — PALONOSETRON HCL INJECTION 0.25 MG/5ML
INTRAVENOUS | Status: AC
  Filled 2018-03-22: qty 5

## 2018-03-22 MED ORDER — FLUOROURACIL CHEMO INJECTION 2.5 GM/50ML
400.0000 mg/m2 | Freq: Once | INTRAVENOUS | Status: AC
  Administered 2018-03-22: 850 mg via INTRAVENOUS
  Filled 2018-03-22: qty 17

## 2018-03-22 MED ORDER — DEXAMETHASONE SODIUM PHOSPHATE 10 MG/ML IJ SOLN
INTRAMUSCULAR | Status: AC
  Filled 2018-03-22: qty 1

## 2018-03-22 MED ORDER — DEXTROSE 5 % IV SOLN
Freq: Once | INTRAVENOUS | Status: AC
  Administered 2018-03-22: 13:00:00 via INTRAVENOUS
  Filled 2018-03-22: qty 250

## 2018-03-22 MED ORDER — SODIUM CHLORIDE 0.9 % IV SOLN
2400.0000 mg/m2 | INTRAVENOUS | Status: DC
  Administered 2018-03-22: 5150 mg via INTRAVENOUS
  Filled 2018-03-22: qty 103

## 2018-03-22 MED ORDER — PALONOSETRON HCL INJECTION 0.25 MG/5ML
0.2500 mg | Freq: Once | INTRAVENOUS | Status: AC
  Administered 2018-03-22: 0.25 mg via INTRAVENOUS

## 2018-03-22 MED ORDER — DEXAMETHASONE SODIUM PHOSPHATE 10 MG/ML IJ SOLN
10.0000 mg | Freq: Once | INTRAMUSCULAR | Status: AC
  Administered 2018-03-22: 10 mg via INTRAVENOUS

## 2018-03-22 MED ORDER — SODIUM CHLORIDE 0.9% FLUSH
10.0000 mL | INTRAVENOUS | Status: DC | PRN
  Administered 2018-03-22: 10 mL
  Filled 2018-03-22: qty 10

## 2018-03-22 MED ORDER — LEUCOVORIN CALCIUM INJECTION 350 MG
400.0000 mg/m2 | Freq: Once | INTRAVENOUS | Status: AC
  Administered 2018-03-22: 856 mg via INTRAVENOUS
  Filled 2018-03-22: qty 35

## 2018-03-22 MED ORDER — OXALIPLATIN CHEMO INJECTION 100 MG/20ML
85.0000 mg/m2 | Freq: Once | INTRAVENOUS | Status: AC
  Administered 2018-03-22: 180 mg via INTRAVENOUS
  Filled 2018-03-22: qty 36

## 2018-03-22 NOTE — ED Provider Notes (Signed)
Medical screening examination/treatment/procedure(s) were conducted as a shared visit with non-physician practitioner(s) and myself.  I personally evaluated the patient during the encounter.  None  Patient is a 72 year old male with history of previous urinary retention who presents to the ED with urinary retention.  Has not urinated normally since noon.  Has an appointment with his urologist in the morning.  Foley catheter placed with good drainage of urine and relief of symptoms.  Patient already on antibiotics for UTI.   Sergei Delo, Delice Bison, DO 03/22/18 [phone]

## 2018-03-22 NOTE — Progress Notes (Signed)
Went to infusion to introduce myself to patient as his Arboriculturist and to give my card for any additional financial questions or concerns. Patient has 2 insurances, therefore copay assistance should not be needed.  Discussed the one-time $500 Engineer, drilling. Advised patient if he is interested in applying, proof of household income would be needed. He verbalized understanding.

## 2018-03-22 NOTE — Discharge Instructions (Addendum)
Return here with any severe pain, high fever or new concern.

## 2018-03-22 NOTE — ED Provider Notes (Signed)
[facility] DEPT Provider Note   CSN: [id number] Arrival date & time: 03/22/18  0020     History   Chief Complaint Chief Complaint  Patient presents with  . Urinary Retention    HPI Dave Vaughn is a 72 y.o. male.  Patient presents with difficulty urinating that has been progressive throughout today. He recently had a foley catheter placed for retention associated with UTI. This was removed yesterday in the ED by patient request secondary to pain. He returns tonight for recurrent symptoms. He is taking his antibiotic as prescribed and reports he has a follow up appointment with his urologist, Dr. Lovena Neighbours, for recheck later this morning (03/22/18). No fever, nausea or vomiting. He denies hematuria.  The history is provided by the patient and the spouse. No language interpreter was used.    Past Medical History:  Diagnosis Date  . Adenocarcinoma, colon ([place])   . Anemia    taking iron supplements  . Atrial fibrillation with RVR ([place])   . Diverticulitis   . Dysrhythmia    afib  . History of kidney stones   . Kidney stones     Patient Active Problem List   Diagnosis Date Noted  . Cancer of left colon ([place]) 01/16/2018  . Protein-calorie malnutrition, severe 01/14/2018  . New onset a-fib ([place]) 01/11/2018  . Colonic obstruction ([place]) 01/10/2018  . Bowel obstruction ([place]) 01/10/2018    Past Surgical History:  Procedure Laterality Date  . ANKLE SURGERY Left    when he was in college  . COLON RESECTION N/A 01/11/2018   Procedure: LEFT COLON RESECTION, TAKEDOWN SPLENIC FLEXURE, COLOSTOMY;  Surgeon: [name], MD;  Location: [facility];  Service: General;  Laterality: N/A;  . COLONOSCOPY  01/11/2018   Procedure: COLONOSCOPY;  Surgeon: Jackquline Denmark, MD;  Location: [facility];  Service: Endoscopy;;  . PORTACATH PLACEMENT Right 03/07/2018   Procedure: INSERTION PORT-A-CATH RIGHT SUBCLAVIAN;  Surgeon: [name], MD;  Location: [facility];  Service:  General;  Laterality: Right;  . thumb surgery   2018   cyst removal        Home Medications    Prior to Admission medications   Medication Sig Start Date End Date Taking? Authorizing Provider  acetaminophen (TYLENOL) 500 MG tablet Take 1,000 mg by mouth every 6 (six) hours as needed (for pain.).    [provider]  ALPRAZolam Duanne Moron) 0.25 MG tablet Take 1 tablet (0.25 mg total) by mouth at bedtime as needed for anxiety. 03/14/18   [name], MD  amiodarone (PACERONE) 200 MG tablet Take 1 tablet (200 mg total) by mouth 2 (two) times daily. 01/17/18   Georgette Shell, MD  apixaban (ELIQUIS) 5 MG TABS tablet Take 1 tablet (5 mg total) by mouth 2 (two) times daily. 01/17/18   Georgette Shell, MD  cefpodoxime (VANTIN) 200 MG tablet Take 1 tablet (200 mg total) by mouth 2 (two) times daily for 10 days. 03/19/18 03/29/18  Duffy Bruce, MD  diltiazem (CARTIA XT) 240 MG 24 hr capsule Take 1 capsule (240 mg total) by mouth daily. 01/17/18 01/17/19  Georgette Shell, MD  docusate sodium (COLACE) 100 MG capsule Take 100 mg by mouth 2 (two) times daily.    [provider]  lidocaine-prilocaine (EMLA) cream Apply to affected area once 02/28/18   [name], MD  Multiple Vitamin (MULTIVITAMIN WITH MINERALS) TABS tablet Take 1 tablet by mouth daily.    [provider]  Multiple Vitamins-Iron (MULTIVITAMIN/IRON PO)  Take 1 tablet by mouth 2 (two) times daily.    [provider]  ondansetron (ZOFRAN) 8 MG tablet Take 1 tablet (8 mg total) by mouth 2 (two) times daily as needed for refractory nausea / vomiting. Start on day 3 after chemotherapy. 02/28/18   [name], MD  prochlorperazine (COMPAZINE) 10 MG tablet Take 1 tablet (10 mg total) by mouth every 6 (six) hours as needed (Nausea or vomiting). 02/28/18   [name], MD  tamsulosin (FLOMAX) 0.4 MG CAPS capsule Take 0.4 mg by mouth.    [provider]    Family History Family History  Problem Relation  Age of Onset  . Breast cancer Mother 50       metastatin; recurrence x7  . Heart attack Father 22  . Cancer Daughter        melanoma, leukemia    Social History Social History   Tobacco Use  . Smoking status: Never Smoker  . Smokeless tobacco: Never Used  Substance Use Topics  . Alcohol use: Yes    Comment: drinks socially about 2x/week  . Drug use: Never     Allergies   Patient has no known allergies.   Review of Systems Review of Systems  Constitutional: Negative for fever.  Gastrointestinal: Positive for abdominal pain. Negative for nausea and vomiting.  Genitourinary: Positive for difficulty urinating. Negative for hematuria.  Musculoskeletal: Negative for back pain.  Neurological: Negative for weakness.     Physical Exam Updated Vital Signs BP (!) 137/92 (BP Location: Right Arm)   Pulse 98   Temp 98.2 F (36.8 C) (Oral)   Resp 17   SpO2 98%   Physical Exam  Constitutional: He is oriented to person, place, and time. He appears well-developed and well-nourished.  Appears uncomfortable.   Neck: Normal range of motion.  Cardiovascular: Normal rate.  Pulmonary/Chest: Effort normal.  Abdominal:  Lower abdominal tenderness to palpation. No significant distention.  Musculoskeletal: Normal range of motion.  Neurological: He is alert and oriented to person, place, and time.  Skin: Skin is warm and dry.  Psychiatric: He has a normal mood and affect.  Nursing note and vitals reviewed.    ED Treatments / Results  Labs (all labs ordered are listed, but only abnormal results are displayed) Labs Reviewed - No data to display  EKG None  Radiology No results found.  Procedures Procedures (including critical care time)  Medications Ordered in ED Medications  lidocaine (XYLOCAINE) 2 % jelly 1 application (has no administration in time range)     Initial Impression / Assessment and Plan / ED Course  I have reviewed the triage vital signs and the  nursing notes.  Pertinent labs & imaging results that were available during my care of the patient were reviewed by me and considered in my medical decision making (see chart for details).     Patient with recurrent urinary retention after foley catheter removal yesterday. He is taking antibiotics for UTI. No fever, vomiting.   He is uncomfortable appearing. VSS. No concern for sepsis. Foley catheter placed without difficulty, emptying 350 cc clear urine into the bag. The patient is significantly improved. Repeat abdominal exam finds him nontender.   Leg bag attached. He can be discharged home and is encouraged to keep the appointment in the morning with Dr. Lovena Neighbours.   Final Clinical Impressions(s) / ED Diagnoses   Final diagnoses:  None   1. Urinary retention  ED Discharge Orders    None  Charlann Lange, PA-C 03/22/18 0256    Ward, Delice Bison, DO 03/22/18 [phone]

## 2018-03-22 NOTE — Patient Instructions (Signed)
[facility] Discharge Instructions for Patients Receiving Chemotherapy  Today you received the following chemotherapy agents: Oxaliplatin, Leucovorin, and 5FU.  To help prevent nausea and vomiting after your treatment, we encourage you to take your nausea medication as directed.   If you develop nausea and vomiting that is not controlled by your nausea medication, call the clinic.   BELOW ARE SYMPTOMS THAT SHOULD BE REPORTED IMMEDIATELY:  *FEVER GREATER THAN 100.5 F  *CHILLS WITH OR WITHOUT FEVER  NAUSEA AND VOMITING THAT IS NOT CONTROLLED WITH YOUR NAUSEA MEDICATION  *UNUSUAL SHORTNESS OF BREATH  *UNUSUAL BRUISING OR BLEEDING  TENDERNESS IN MOUTH AND THROAT WITH OR WITHOUT PRESENCE OF ULCERS  *URINARY PROBLEMS  *BOWEL PROBLEMS  UNUSUAL RASH Items with * indicate a potential emergency and should be followed up as soon as possible.  Feel free to call the clinic should you have any questions or concerns. The clinic phone number is [phone].  Please show the CHEMO ALERT CARD at check-in to the Emergency Department and triage nurse.    

## 2018-03-22 NOTE — ED Triage Notes (Signed)
Patient reports he is experiencing urinary retention for the last hour. Patient was seen at [facility] on 10/15. A catheter was placed but patient reports it did not feel like it placed correctly. He reports having to push in order to urinate. On Monday, he returned back to [facility] for the pain and discomfort. It was removed during that visit.

## 2018-03-23 ENCOUNTER — Other Ambulatory Visit: Payer: Self-pay | Admitting: Nurse Practitioner

## 2018-03-23 ENCOUNTER — Telehealth: Payer: Self-pay

## 2018-03-23 DIAGNOSIS — C186 Malignant neoplasm of descending colon: Secondary | ICD-10-CM

## 2018-03-23 MED ORDER — PROMETHAZINE HCL 25 MG PO TABS: 25.0000 mg | ORAL_TABLET | Freq: Three times a day (TID) | ORAL | 1 refills | Status: DC | PRN

## 2018-03-23 NOTE — Telephone Encounter (Signed)
Left voice message for patient's wife regarding trembling.  Consulted with Cira Rue NP probably coming from Compazine.  Lacie will send in Phenergan take as needed.  If he doesn't need the compazine advise him to not take it.  Instructed her to call back if symptoms worsen or he develops new side effects.

## 2018-03-24 ENCOUNTER — Inpatient Hospital Stay: Payer: 59

## 2018-03-24 DIAGNOSIS — C186 Malignant neoplasm of descending colon: Secondary | ICD-10-CM

## 2018-03-24 DIAGNOSIS — Z5111 Encounter for antineoplastic chemotherapy: Secondary | ICD-10-CM | POA: Diagnosis not present

## 2018-03-24 LAB — CULTURE, BLOOD (ROUTINE X 2)
CULTURE: NO GROWTH
Culture: NO GROWTH
SPECIAL REQUESTS: ADEQUATE

## 2018-03-24 MED ORDER — SODIUM CHLORIDE 0.9% FLUSH
10.0000 mL | INTRAVENOUS | Status: DC | PRN
  Administered 2018-03-24: 10 mL
  Filled 2018-03-24: qty 10

## 2018-03-24 MED ORDER — HEPARIN SOD (PORK) LOCK FLUSH 100 UNIT/ML IV SOLN
500.0000 [IU] | Freq: Once | INTRAVENOUS | Status: AC | PRN
  Administered 2018-03-24: 500 [IU]
  Filled 2018-03-24: qty 5

## 2018-03-24 NOTE — Patient Instructions (Signed)
Implanted Port Home Guide An implanted port is a type of central line that is placed under the skin. Central lines are used to provide IV access when treatment or nutrition needs to be given through a person's veins. Implanted ports are used for long-term IV access. An implanted port may be placed because:  You need IV medicine that would be irritating to the small veins in your hands or arms.  You need long-term IV medicines, such as antibiotics.  You need IV nutrition for a long period.  You need frequent blood draws for lab tests.  You need dialysis.  Implanted ports are usually placed in the chest area, but they can also be placed in the upper arm, the abdomen, or the leg. An implanted port has two main parts:  Reservoir. The reservoir is round and will appear as a small, raised area under your skin. The reservoir is the part where a needle is inserted to give medicines or draw blood.  Catheter. The catheter is a thin, flexible tube that extends from the reservoir. The catheter is placed into a large vein. Medicine that is inserted into the reservoir goes into the catheter and then into the vein.  How will I care for my incision site? Do not get the incision site wet. Bathe or shower as directed by your health care provider. How is my port accessed? Special steps must be taken to access the port:  Before the port is accessed, a numbing cream can be placed on the skin. This helps numb the skin over the port site.  Your health care provider uses a sterile technique to access the port. ? Your health care provider must put on a mask and sterile gloves. ? The skin over your port is cleaned carefully with an antiseptic and allowed to dry. ? The port is gently pinched between sterile gloves, and a needle is inserted into the port.  Only "non-coring" port needles should be used to access the port. Once the port is accessed, a blood return should be checked. This helps ensure that the port  is in the vein and is not clogged.  If your port needs to remain accessed for a constant infusion, a clear (transparent) bandage will be placed over the needle site. The bandage and needle will need to be changed every week, or as directed by your health care provider.  Keep the bandage covering the needle clean and dry. Do not get it wet. Follow your health care provider's instructions on how to take a shower or bath while the port is accessed.  If your port does not need to stay accessed, no bandage is needed over the port.  What is flushing? Flushing helps keep the port from getting clogged. Follow your health care provider's instructions on how and when to flush the port. Ports are usually flushed with saline solution or a medicine called heparin. The need for flushing will depend on how the port is used.  If the port is used for intermittent medicines or blood draws, the port will need to be flushed: ? After medicines have been given. ? After blood has been drawn. ? As part of routine maintenance.  If a constant infusion is running, the port may not need to be flushed.  How long will my port stay implanted? The port can stay in for as long as your health care provider thinks it is needed. When it is time for the port to come out, surgery will be   done to remove it. The procedure is similar to the one performed when the port was put in. When should I seek immediate medical care? When you have an implanted port, you should seek immediate medical care if:  You notice a bad smell coming from the incision site.  You have swelling, redness, or drainage at the incision site.  You have more swelling or pain at the port site or the surrounding area.  You have a fever that is not controlled with medicine.  This information is not intended to replace advice given to you by your health care provider. Make sure you discuss any questions you have with your health care provider. Document  Released: 05/24/2005 Document Revised: 10/30/2015 Document Reviewed: 01/29/2013 [employer] Interactive Patient Education  2017 [employer]  

## 2018-03-29 ENCOUNTER — Telehealth: Payer: Self-pay | Admitting: Hematology

## 2018-03-29 NOTE — Telephone Encounter (Signed)
Appts scheduled LMVM with date/time per 10/ los

## 2018-04-03 NOTE — Progress Notes (Addendum)
[place]  [phone] [phone]  Clinic Follow up Note   Patient Care Team: [name], MD as PCP - General (Family Medicine) [name], MD as Consulting Physician (Cardiology) [name], MD as Consulting Physician (Gastroenterology) 04/05/2018  Chief Complaint: F/u on left colon cancer  SUMMARY OF ONCOLOGIC HISTORY: Oncology History   Cancer Staging Cancer of left colon [facility]) Staging form: Colon and Rectum, AJCC 8th Edition - Pathologic stage from 01/11/2018: Stage IIIB (pT3, pN1c, cM0) - Signed by [name], MD on 01/16/2018       Cancer of left colon ([place])   01/10/2018 Imaging    CT AP W Contrast 01/10/18  IMPRESSION: Irregular soft tissue density causing stricture of the mid descending colon likely the site of obstruction for the dilated small bowel. 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 No evidence of perforation.  Equivocal findings involving the appendix measuring 1.2 cm at the appendiceal tip with mucosal enhancement. No adjacent free fluid or inflammatory change. Findings are nonspecific, but can be seen with early acute appendicitis.  Mild prostatic enlargement. Increased density over the posterior bladder base likely due to the large prostatic impression although cannot completely exclude a bladder mass. Urology protocol CT or ultrasound may be helpful for better evaluation.  Mild cholelithiasis.  Stable 1.5 cm cystic structure over the lower pole right kidney likely slightly hyperdense cyst.  Diverticulosis of the colon.  Aortic Atherosclerosis (ICD10-I70.0).      01/11/2018 Cancer Staging    Staging form: Colon and Rectum, AJCC 8th Edition - Pathologic stage from 01/11/2018: Stage IIIB (pT3, pN1c, cM0) - Signed by [name], MD on 01/16/2018    01/11/2018 Surgery    LEFT COLON RESECTION, TAKEDOWN SPLENIC FLEXURE, COLOSTOMY by Dr. [name]     01/11/2018 Procedure    Colonoscopy 01/11/18 by Dr.  [name]  - Malignant completely obstructing tumor in the mid descending colon. Tattooed. - Diverticulosis in the sigmoid colon. - Non-bleeding internal hemorrhoids. - No specimens collected.    01/11/2018 Pathology Results    Diagnosis 01/11/18  1. Colon, segmental resection for tumor, descending colon - INVASIVE COLORECTAL ADENOCARCINOMA, 4 CM. - TUMOR EXTENDS INTO PERICOLONIC CONNECTIVE TISSUE. - TUMOR FOCALLY INVOLVES RADIAL MARGIN. - ONE MESENTERIC TUMOR DEPOSIT. - THIRTEEN BENIGN LYMPH NODES (0/13). 2. Colon, segmental resection, splenic flexure - BENIGN COLON. - NO EVIDENCE OF MALIGNANCY .    01/11/2018 Tumor Marker    Baseline CEA at 3.4    01/16/2018 Initial Diagnosis    Cancer of left colon ([place])    01/23/2018 Imaging    CT CHEST WO CONTRAST IMPRESSION: 1. No evidence for metastatic disease within the chest. 2. Small left pleural effusion with underlying opacities which may represent atelectasis. Right basilar atelectasis. 3. Few foci of gas within the upper abdomen in the omentum with surrounding fat stranding, likely postsurgical 4. Aortic Atherosclerosis (ICD10-I70.0).    03/07/2018 -  Chemotherapy    The patient had palonosetron (ALOXI) injection 0.25 mg, 0.25 mg, Intravenous,  Once, 3 of 12 cycles Administration: 0.25 mg (03/08/2018), 0.25 mg (03/22/2018) leucovorin 856 mg in dextrose 5 % 250 mL infusion, 400 mg/m2 = 856 mg, Intravenous,  Once, 3 of 12 cycles Administration: 856 mg (03/08/2018), 856 mg (03/22/2018) oxaliplatin (ELOXATIN) 180 mg in dextrose 5 % 500 mL chemo infusion, 85 mg/m2 = 180 mg, Intravenous,  Once, 3 of 12 cycles Administration: 180 mg (03/08/2018), 180 mg (03/22/2018) fluorouracil (ADRUCIL) chemo injection 850 mg, 400 mg/m2 = 850 mg,  Intravenous,  Once, 3 of 12 cycles Administration: 850 mg (03/08/2018), 850 mg (03/22/2018) fluorouracil (ADRUCIL) 5,150 mg in sodium chloride 0.9 % 147 mL chemo infusion, 2,400 mg/m2 = 5,150 mg, Intravenous, 1 Day/Dose, 3  of 12 cycles Administration: 5,150 mg (03/08/2018), 5,150 mg (03/22/2018)  for chemotherapy treatment.     03/19/2018 Imaging    03/19/2018 CT AP IMPRESSION: 1. Interval partial left hemicolectomy and descending colostomy. 2. Heterogeneous soft tissue density along the left anterior renal fascia is most likely postoperative (favor fat necrosis). No well-defined fluid collection. 3. Mild left lower quadrant edema, new since 01/10/2018. This could be postoperative. Superimposed sigmoid diverticulitis and/or cystitis cannot be excluded. 4. Subtle hyperenhancing nodule within the anterior bladder dome cannot be excluded. Consider nonemergent cystoscopy. When this is performed, recommend attention to the left ureterovesicular junction and distal left ureter to evaluate questionable soft tissue fullness. 5. Cholelithiasis. 6.  Aortic Atherosclerosis (ICD10-I70.0). 7. Prostatomegaly.     History of present illness:   (During hospital admission on 01/16/18)  Dave Vaughn 72 y.o. male was admitted to the hospital on 01/10/18 for abdominal pain and vomiting. His CT showed colonic obstruction. He underwent Colonoscopy on 01/11/18 by Dr. [name] which showed Diverticulitis and malignant tumor in colon. He underwent surgery on 01/11/18 for removal by Dr. [name].  The patient presents in the hospital today accompanied by his wife.  He notes intermittent abdominal pain started 3-4 weeks ago that got progressively worse. 2 weeks ago he developed nausea and diarrhea which he thought was a stomach virus. Symptoms persisted and he developed abdominal bloating last week when her presented to the ED.   Today the patient notes he feels well post surgery. He is getting use to colostomy bag. He notes Dr. [name] plans to get colostomy reversal soon. He changes his bag every 2-3 hours. He is able to eat by mouth with heart healthy diet. He had Afib noted during this hospital stay before he underwent surgery. He  denies SOB or heart palpitations or chest pain. At most he took 2 baby aspirin a day and a multivitamin daily.   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 He is very active with exercise and travels often.   He does not have a significant PMHx. His last colonoscopy was 12-13 years ago when he had benign polyps and diverticulitis. He denies issues with this or GI bleeding. He had ankle surgery in college from sports. 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 He is a non smoker and drinks about 2 bears a week, overall socially.  His mother passed from metastatic breast cancer. She was diagnosed in her 57s. His daughter had skin melanoma and ALL. She is currently cured.  CURRENT THERAPY: adjuvant FOLFOX q2 weeks x6 months   INTERVAL HISTORY:  SANKALP FERRELL is a 72 y.o. male who is here for follow-up. Since our last visit, he went to the ER for dysuria and urinary catheter discomfort. He has also seen NP Lacie 2 weeks ago when he was noted to had tolerated chemo well. Today, he is here with his wife. He feels good and states that his UTI improved. He will have a cystoscopy next week. He states that he is tolerating chemo well, and his only complaint is fatigue and very mild hair loss. He denies cold sensitivity or appetite loss. His abdominal wound is healing well, and he denies pain or drainage.  He drinks almost 3 bottles of water a day.    REVIEW OF SYSTEMS:   Constitutional: Denies fevers, chills or abnormal weight loss (+) fatigue (+) mild hair loss Eyes: Denies blurriness of vision Ears, nose, mouth, throat, and face: Denies mucositis or sore throat Respiratory: Denies cough, dyspnea or wheezes Cardiovascular: Denies palpitation, chest discomfort, no new LL edema Gastrointestinal:  Denies nausea, heartburn or change in bowel habits (+) abdominal wound healing GU: (+) UTI improving Skin: Denies abnormal skin  rashes Lymphatics: Denies new lymphadenopathy or easy bruising Neurological:Denies numbness, tingling or new weaknesses Behavioral/Psych: Mood is stable, no new changes  All other systems were reviewed with the patient and are negative.  MEDICAL HISTORY:  Past Medical History:  Diagnosis Date   Adenocarcinoma, colon ([place])    Anemia    taking iron supplements   Atrial fibrillation with RVR ([place])    Diverticulitis    Dysrhythmia    afib   History of kidney stones    Kidney stones     SURGICAL HISTORY: Past Surgical History:  Procedure Laterality Date   ANKLE SURGERY Left    when he was in college   COLON RESECTION N/A 01/11/2018   Procedure: LEFT COLON RESECTION, TAKEDOWN SPLENIC FLEXURE, COLOSTOMY;  Surgeon: [name], MD;  Location: [facility];  Service: General;  Laterality: N/A;   COLONOSCOPY  01/11/2018   Procedure: COLONOSCOPY;  Surgeon: Jackquline Denmark, MD;  Location: [facility];  Service: Endoscopy;;   PORTACATH PLACEMENT Right 03/07/2018   Procedure: INSERTION PORT-A-CATH RIGHT SUBCLAVIAN;  Surgeon: [name], MD;  Location: [facility];  Service: General;  Laterality: Right;   thumb surgery   2018   cyst removal    I have reviewed the social history and family history with the patient and they are unchanged from previous note.  ALLERGIES:  has No Known Allergies.  MEDICATIONS:  Current Outpatient Medications  Medication Sig Dispense Refill   acetaminophen (TYLENOL) 500 MG tablet Take 1,000 mg by mouth every 6 (six) hours as needed (for pain.).     ALPRAZolam (XANAX) 0.25 MG tablet Take 1 tablet (0.25 mg total) by mouth at bedtime as needed for anxiety. 20 tablet 0   amiodarone (PACERONE) 200 MG tablet Take 1 tablet (200 mg total) by mouth 2 (two) times daily. 60 tablet 0   apixaban (ELIQUIS) 5 MG TABS tablet Take 1 tablet (5 mg total) by mouth 2 (two) times daily. 60 tablet 0   diltiazem (CARTIA XT) 240 MG 24 hr capsule Take 1 capsule (240 mg total) by  mouth daily. 30 capsule 11   docusate sodium (COLACE) 100 MG capsule Take 100 mg by mouth 2 (two) times daily.     lidocaine-prilocaine (EMLA) cream Apply to affected area once 30 g 3   Multiple Vitamin (MULTIVITAMIN WITH MINERALS) TABS tablet Take 1 tablet by mouth daily.     Multiple Vitamins-Iron (MULTIVITAMIN/IRON PO) Take 1 tablet by mouth 2 (two) times daily.     ondansetron (ZOFRAN) 8 MG tablet Take 1 tablet (8 mg total) by mouth 2 (two) times daily as needed for refractory nausea / vomiting. Start on day 3 after chemotherapy. 30 tablet 1   prochlorperazine (COMPAZINE) 10 MG tablet Take 1 tablet (10 mg total) by mouth every 6 (six) hours as needed (Nausea or vomiting). 30 tablet 1   promethazine (PHENERGAN) 25 MG tablet Take 1 tablet (25 mg total) by mouth every 8 (eight) hours as needed for nausea or vomiting. 30 tablet 1  tamsulosin (FLOMAX) 0.4 MG CAPS capsule Take 0.4 mg by mouth.     Current Facility-Administered Medications  Medication Dose Route Frequency Provider Last Rate Last Dose   0.9 %  sodium chloride infusion   Intravenous Continuous [name], MD       Facility-Administered Medications Ordered in Other Visits  Medication Dose Route Frequency Provider Last Rate Last Dose   0.9 %  sodium chloride infusion   Intravenous Continuous [name], MD 500 mL/hr at 04/05/18 1033     fluorouracil (ADRUCIL) 5,150 mg in sodium chloride 0.9 % 147 mL chemo infusion  2,400 mg/m2 (Treatment Plan Recorded) Intravenous 1 day or 1 dose [name], MD       fluorouracil (ADRUCIL) chemo injection 850 mg  400 mg/m2 (Treatment Plan Recorded) Intravenous Once [name], MD       heparin lock flush 100 unit/mL  500 Units Intracatheter Once PRN [name], MD       leucovorin 856 mg in dextrose 5 % 250 mL infusion  400 mg/m2 (Treatment Plan Recorded) Intravenous Once [name], MD 146 mL/hr at 04/05/18 1146 856 mg at 04/05/18 1146   oxaliplatin (ELOXATIN) 180 mg in dextrose 5 % 500 mL  chemo infusion  85 mg/m2 (Treatment Plan Recorded) Intravenous Once [name], MD 268 mL/hr at 04/05/18 1143 180 mg at 04/05/18 1143   sodium chloride flush (NS) 0.9 % injection 10 mL  10 mL Intracatheter PRN [name], MD        PHYSICAL EXAMINATION: ECOG PERFORMANCE STATUS: 1 - Symptomatic but completely ambulatory  Vitals:   04/05/18 0937  BP: 127/89  Pulse: 77  Resp: 18  Temp: 98.2 F (36.8 C)  SpO2: 99%   Filed Weights   04/05/18 0937  Weight: 212 lb 11.2 oz (96.5 kg)    GENERAL:alert, no distress and comfortable SKIN: skin color, texture, turgor are normal, no rashes or significant lesions EYES: normal, Conjunctiva are pink and non-injected, sclera clear OROPHARYNX:no exudate, no erythema and lips, buccal mucosa, and tongue normal  NECK: supple, thyroid normal size, non-tender, without nodularity LYMPH:  no palpable lymphadenopathy in the cervical, axillary or inguinal LUNGS: clear to auscultation and percussion with normal breathing effort HEART: regular rate & rhythm and no murmurs and no LL edema ABDOMEN:abdomen soft, non-tender and normal bowel sounds (+) colostomy bag placed, wound healing well and is 8x2 CM, per his picture from this morning, healing well Musculoskeletal:no cyanosis of digits and no clubbing  NEURO: alert & oriented x 3 with fluent speech, no focal motor/sensory deficits   LABORATORY DATA:  I have reviewed the data as listed CBC Latest Ref Rng & Units 04/05/2018 03/22/2018 03/19/2018  WBC 4.0 - 10.5 K/uL 6.3 4.4 16.0(H)  Hemoglobin 13.0 - 17.0 g/dL 11.2(L) 10.4(L) 11.2(L)  Hematocrit 39.0 - 52.0 % 34.8(L) 32.7(L) 35.5(L)  Platelets 150 - 400 K/uL 236 184 237     CMP Latest Ref Rng & Units 04/05/2018 03/22/2018 03/19/2018  Glucose 70 - 99 mg/dL 102(H) 122(H) 132(H)  BUN 8 - 23 mg/dL _0 Creatinine 0.61 - 1.24 mg/dL 1.51(H) 1.26(H) 1.25(H)  Sodium 135 - 145 mmol/L 142 142 136  Potassium 3.5 - 5.1 mmol/L 4.5 4.2 3.9  Chloride 98 - 111  mmol/L 105 103 99  CO2 22 - 32 mmol/L _1 Calcium 8.9 - 10.3 mg/dL 9.6 9.5 9.0  Total Protein 6.5 - 8.1 g/dL 7.0 7.4 7.2  Total Bilirubin 0.3 - 1.2 mg/dL 0.8  0.6 1.1  Alkaline Phos 38 - 126 U/L 121 137(H) 111  AST 15 - 41 U/L _0 ALT 0 - 44 U/L 25 41 27   Tumor Markers CEA 01/11/18: 3.6 02/28/18: 2.39 03/08/18: 10.92  SURGERIES  01/11/2018: EXPLORATORY LAPAROTOMY PARTIAL COLECTOMY AND COLOSTOMY with [name], MD   PROCEDURES  01/11/2018 Colonoscopy Findings: A fungating completely obstructing large mass was found in the mid descending colon. The mass was circumferential. No bleeding was present. Area was tattooed with an injection of 4 mL of [place] ink. The scope couldnot be passed beyond Multiple medium-mouthed diverticula were found in the sigmoid colon. Non-bleeding internal hemorrhoids were found during retroflexion and during perianal exam. The hemorrhoids were small. Impression: - Malignant completely obstructing tumor in the mid descending colon. Tattooed. - Diverticulosis in the sigmoid colon. - Non-bleeding internal hemorrhoids. - No specimens collected.   PATHOLOGY  01/11/2018 Surgical Pathology Diagnosis 1. Colon, segmental resection for tumor, descending colon - INVASIVE COLORECTAL ADENOCARCINOMA, 4 CM. - TUMOR EXTENDS INTO PERICOLONIC CONNECTIVE TISSUE. - TUMOR FOCALLY INVOLVES RADIAL MARGIN. - ONE MESENTERIC TUMOR DEPOSIT. - THIRTEEN BENIGN LYMPH NODES (0/13). 2. Colon, segmental resection, splenic flexure - BENIGN COLON. - NO EVIDENCE OF MALIGNANCY . Microscopic Comment 1. COLON AND RECTUM: Resection, Including Transanal Disk Excision of Rectal Neoplasms Procedure: Left colon partial resection Tumor Site: Descending colon. Tumor Size: 4 cm Macroscopic Tumor Perforation: No Histologic Type: Colorectal adenocarcinoma Histologic Grade: Moderately differentiated Tumor Extension: Into pericolonic connective tissue Margins: Focally involves  radial margin Treatment Effect: No Lymphovascular Invasion: No Perineural Invasion: No Tumor Deposits: One Regional Lymph Nodes: No lymph nodes submitted or found: 13 Number of Lymph Nodes Involved: 0, one tumor deposit Number of Lymph Nodes Examined: 13 Pathologic Stage Classification (pTNM, AJCC 8th Edition): pT3, pN1c Ancillary Studies: MMR / MSI testing: pending) Representative tumor block: 1C, 1D, 1E and 1F Comments: The carcinoma extends into the pericolonic connective tissue where there is focal involvement of the radial margin. There is also diverticulosis present. (JDP:kh 01-13-18) (v4.0.1.0)l Information Specimen(s) Obtained: 1. Colon, segmental resection for tumor, descending colon 2. Colon, segmental resection, splenic flexure Specimen Clinical Information 1. left colon obstruction [rd] Gross 1. Specimen: Clinically descending colon Specimen integrity: Intact, there is a stitch at one end designating proximal Specimen length: 14.2 cm Mesorectal intactness: Not applicable Tumor location: Descending 1/3 of colon. Tumor size: 4 x 2.5 cm Percent of bowel circumference involved: 100% Tumor distance to margins: Proximal: 2.5 cm Distal: 9 cm Radial (posterior ascending, posterior descending; lateral and posterior mid-rectum; and entire lower 1/3 rectum): Focally abuts the radial margin at an area of disruption Macroscopic extent of tumor invasion: Grossly involves the muscularis propria and underlying adipose tissue. Does not grossly involve the serosa Total presumed lymph nodes: 19 possible lymph nodes averaging 0.3 cm are identified. Extramural satellite tumor nodules: Not identified. Mucosal polyp(s): None identified. Additional findings: There are several diverticula present up to 2 cm, without contents or perforation. The bowel proximal to the mass is dilated up to 11 cm in circumference. Block summary: A = proximal margin en face B = distal margin en face C = mass  with radial margin at disruption, margin inked black. D = mass with closest peritonealized surface, surface inked yellow E-F = mass with deepest invasion G = mass to normal H = representative diverticulum I = five lymph nodes whole J = five lymph nodes whole K = five lymph nodes whole L = four lymph nodes whole  2. Received in formalin is a 4.7 cm in length x 3 cm in diameter segment of large bowel with abundant pericolonic adipose tissue. Both ends are received stapled, and one end is received with a suture designating final proximal margin per requisition sheet. Upon opening, the specimen is partially filled with soft brown fecal material. The wall averages 0.3 cm in thickness, and the mucosa is tan pink with normal mucosal folding. There are few diverticula present averaging 1 cm, without perforation. The adipose tissue is extensively searched for lymph nodes, of which only two averaging 0.5 cm are identified. Representative sections are submitted as follows: A = final proximal margin en face B = two lymph nodes whole. (AK:kh 01-12-18)   RADIOGRAPHIC STUDIES: I have personally reviewed the radiological images as listed and agreed with the findings in the report.  03/19/2018 CT AP IMPRESSION: 1. Interval partial left hemicolectomy and descending colostomy. 2. Heterogeneous soft tissue density along the left anterior renal fascia is most likely postoperative (favor fat necrosis). No well-defined fluid collection. 3. Mild left lower quadrant edema, new since 01/10/2018. This could be postoperative. Superimposed sigmoid diverticulitis and/or cystitis cannot be excluded. 4. Subtle hyperenhancing nodule within the anterior bladder dome cannot be excluded. Consider nonemergent cystoscopy. When this is performed, recommend attention to the left ureterovesicular junction and distal left ureter to evaluate questionable soft tissue fullness. 5. Cholelithiasis. 6.  Aortic Atherosclerosis  (ICD10-I70.0). 7. Prostatomegaly.  01/23/2018 CT CAP IMPRESSION: 1. No evidence for metastatic disease within the chest. 2. Small left pleural effusion with underlying opacities which may represent atelectasis. Right basilar atelectasis. 3. Few foci of gas within the upper abdomen in the omentum with surrounding fat stranding, likely postsurgical 4. Aortic Atherosclerosis (ICD10-I70.0).   ASSESSMENT & PLAN:  Duwan Adrian a 72 y.o.Caucasianmalewith a history of diverticulitis who presented to the ED for abdominal pain and was found to havecancer of the descending colon.   1. Cancer of left colon, adenocarcinoma, stage IIIB(pT3N1cM0), MSI-stable -I reviewed surgical pathology in detail; given his locally advanced, MSI-stable disease and high risk features, I recommended adjuvant chemotherapy with FOLFOX q2 weeks x6 months vs CAPOX q3 weeks x3 months to reduce risk of recurrence. Printed material was provided previously. -His case was discussed in tumor board, no need for additional surgery or radiation for positive radial margin  -I reviewed CT chest was negative for distant metastasis  - he is recovering well from surgery and hospitalization, his performance status is good; but surgical incision remains open; I delayed adjuvant chemotherapy for wound healing. -I discussed standard of care for his stage IIIB disease is adjuvant chemo to reduce his risk of recurrence by half. I discussed the options of FOLFOX every 2 weeks for 6 months or CAPOX every 3 weeks for 3 months.  Due to his advanced age, and wound healing issue, I think he would tolerate FOLFOX better which I recommend. He agrees.  -He has started adjuvant chemotherapy FOLFOX. He is tolerating chemotherapy well with mild fatigue and hair loss.  -He states that Compazine made his left hand shake. He is using zofran and Phenergan as needed -Labs reviewed, CBC showed Hg 11.2. CMP showed Cr 1.51. CEA pending.  Adequate for  treatment, will proceed to cycle 3 FOLFOX today with full dose, and continue every 2 weeks.   2. AFib, found during hospitalization  - on apixaban, amiodarone, diltiazem followed by Dr. [name]   -01/12/18 ECHO shows EF of 55-60% with moderate mitral valve regurgitation  -He is  on Eliquis    3. Anemia -He had a mild anemia after colon surgery. I previously recommended iron pills and educated the patient about possible bleeding with colon cancer and educated him about constipation with iron pills.  4. Urinary retention and UTI -He had a ED visit on February 24, 2018, due to the urinary retention and a UTI, he is on antibiotics which he will complete in 2 days. -He is also on Flomax now, his Foley has been removed, he will follow-up with his urologist.  5. AKI -His creatinine has slightly improved lately, after his UTI and urinary retention -Creatinine 1.5, I encouraged him to drink more fluids, and will give normal saline 500 in the infusion room today -We will up closely.  PLAN: -Lab reviewed, adequate for treatment, will proceed to cycle 3 FOLFOX today, and continue every 2 weeks  -f/u in 2 weeks    No problem-specific Assessment & Plan notes found for this encounter.   No orders of the defined types were placed in this encounter.  All questions were answered. The patient knows to call the clinic with any problems, questions or concerns. No barriers to learning was detected. I spent 20 minutes counseling the patient face to face. The total time spent in the appointment was 25 minutes and more than 50% was on counseling and review of test results  I, Noor Dweik am acting as scribe for Dr. [name].  I have reviewed the above documentation for accuracy and completeness, and I agree with the above.      [name], MD 04/05/2018

## 2018-04-05 ENCOUNTER — Inpatient Hospital Stay: Payer: 59

## 2018-04-05 ENCOUNTER — Encounter: Payer: Self-pay | Admitting: Hematology

## 2018-04-05 ENCOUNTER — Inpatient Hospital Stay (HOSPITAL_BASED_OUTPATIENT_CLINIC_OR_DEPARTMENT_OTHER): Payer: 59 | Admitting: Hematology

## 2018-04-05 VITALS — BP 127/89 | HR 77 | Temp 98.2°F | Resp 18 | Ht 70.0 in | Wt 212.7 lb

## 2018-04-05 DIAGNOSIS — Z5111 Encounter for antineoplastic chemotherapy: Secondary | ICD-10-CM | POA: Diagnosis not present

## 2018-04-05 DIAGNOSIS — N179 Acute kidney failure, unspecified: Secondary | ICD-10-CM

## 2018-04-05 DIAGNOSIS — Z79899 Other long term (current) drug therapy: Secondary | ICD-10-CM | POA: Diagnosis not present

## 2018-04-05 DIAGNOSIS — D649 Anemia, unspecified: Secondary | ICD-10-CM | POA: Diagnosis not present

## 2018-04-05 DIAGNOSIS — I4891 Unspecified atrial fibrillation: Secondary | ICD-10-CM

## 2018-04-05 DIAGNOSIS — Z95828 Presence of other vascular implants and grafts: Secondary | ICD-10-CM

## 2018-04-05 DIAGNOSIS — C186 Malignant neoplasm of descending colon: Secondary | ICD-10-CM

## 2018-04-05 DIAGNOSIS — N390 Urinary tract infection, site not specified: Secondary | ICD-10-CM

## 2018-04-05 DIAGNOSIS — D490 Neoplasm of unspecified behavior of digestive system: Secondary | ICD-10-CM

## 2018-04-05 LAB — CMP (CANCER CENTER ONLY)
ALBUMIN: 3.7 g/dL (ref 3.5–5.0)
ALK PHOS: 121 U/L (ref 38–126)
ALT: 25 U/L (ref 0–44)
ANION GAP: 10 (ref 5–15)
AST: 21 U/L (ref 15–41)
BUN: 18 mg/dL (ref 8–23)
CALCIUM: 9.6 mg/dL (ref 8.9–10.3)
CO2: 27 mmol/L (ref 22–32)
CREATININE: 1.51 mg/dL — AB (ref 0.61–1.24)
Chloride: 105 mmol/L (ref 98–111)
GFR, Est AFR Am: 52 mL/min — ABNORMAL LOW (ref >60)
GFR, Estimated: 45 mL/min — ABNORMAL LOW (ref >60)
Glucose, Bld: 102 mg/dL — ABNORMAL HIGH (ref 70–99)
Potassium: 4.5 mmol/L (ref 3.5–5.1)
SODIUM: 142 mmol/L (ref 135–145)
Total Bilirubin: 0.8 mg/dL (ref 0.3–1.2)
Total Protein: 7 g/dL (ref 6.5–8.1)

## 2018-04-05 LAB — CBC WITH DIFFERENTIAL (CANCER CENTER ONLY)
Abs Immature Granulocytes: 0.01 10*3/uL (ref 0.00–0.07)
Basophils Absolute: 0 10*3/uL (ref 0.0–0.1)
Basophils Relative: 1 %
EOS ABS: 0.1 10*3/uL (ref 0.0–0.5)
Eosinophils Relative: 1 %
HEMATOCRIT: 34.8 % — AB (ref 39.0–52.0)
Hemoglobin: 11.2 g/dL — ABNORMAL LOW (ref 13.0–17.0)
IMMATURE GRANULOCYTES: 0 %
Lymphocytes Relative: 28 %
Lymphs Abs: 1.8 10*3/uL (ref 0.7–4.0)
MCH: 28.7 pg (ref 26.0–34.0)
MCHC: 32.2 g/dL (ref 30.0–36.0)
MCV: 89.2 fL (ref 80.0–100.0)
MONOS PCT: 8 %
Monocytes Absolute: 0.5 10*3/uL (ref 0.1–1.0)
NEUTROS PCT: 62 %
Neutro Abs: 3.9 10*3/uL (ref 1.7–7.7)
Platelet Count: 236 10*3/uL (ref 150–400)
RBC: 3.9 MIL/uL — ABNORMAL LOW (ref 4.22–5.81)
RDW: 18.1 % — AB (ref 11.5–15.5)
WBC Count: 6.3 10*3/uL (ref 4.0–10.5)
nRBC: 0 % (ref 0.0–0.2)

## 2018-04-05 LAB — CEA (IN HOUSE-CHCC): CEA (CHCC-In House): 3.8 ng/mL (ref 0.00–5.00)

## 2018-04-05 MED ORDER — SODIUM CHLORIDE 0.9 % IV SOLN
INTRAVENOUS | Status: DC
  Administered 2018-04-05: 11:00:00 via INTRAVENOUS
  Filled 2018-04-05 (×2): qty 250

## 2018-04-05 MED ORDER — SODIUM CHLORIDE 0.9 % IV SOLN
2400.0000 mg/m2 | INTRAVENOUS | Status: DC
  Administered 2018-04-05: 5150 mg via INTRAVENOUS
  Filled 2018-04-05: qty 103

## 2018-04-05 MED ORDER — OXALIPLATIN CHEMO INJECTION 100 MG/20ML
85.0000 mg/m2 | Freq: Once | INTRAVENOUS | Status: AC
  Administered 2018-04-05: 180 mg via INTRAVENOUS
  Filled 2018-04-05: qty 36

## 2018-04-05 MED ORDER — DEXTROSE 5 % IV SOLN
Freq: Once | INTRAVENOUS | Status: AC
  Administered 2018-04-05: 10:00:00 via INTRAVENOUS
  Filled 2018-04-05: qty 250

## 2018-04-05 MED ORDER — PALONOSETRON HCL INJECTION 0.25 MG/5ML
INTRAVENOUS | Status: AC
  Filled 2018-04-05: qty 5

## 2018-04-05 MED ORDER — SODIUM CHLORIDE 0.9% FLUSH
10.0000 mL | INTRAVENOUS | Status: DC | PRN
  Administered 2018-04-05: 10 mL
  Filled 2018-04-05: qty 10

## 2018-04-05 MED ORDER — HEPARIN SOD (PORK) LOCK FLUSH 100 UNIT/ML IV SOLN
500.0000 [IU] | Freq: Once | INTRAVENOUS | Status: DC | PRN
  Filled 2018-04-05: qty 5

## 2018-04-05 MED ORDER — SODIUM CHLORIDE 0.9 % IV SOLN
INTRAVENOUS | Status: DC
  Filled 2018-04-05: qty 250

## 2018-04-05 MED ORDER — PALONOSETRON HCL INJECTION 0.25 MG/5ML
0.2500 mg | Freq: Once | INTRAVENOUS | Status: AC
  Administered 2018-04-05: 0.25 mg via INTRAVENOUS

## 2018-04-05 MED ORDER — FLUOROURACIL CHEMO INJECTION 2.5 GM/50ML
400.0000 mg/m2 | Freq: Once | INTRAVENOUS | Status: AC
  Administered 2018-04-05: 850 mg via INTRAVENOUS
  Filled 2018-04-05: qty 17

## 2018-04-05 MED ORDER — LEUCOVORIN CALCIUM INJECTION 350 MG
400.0000 mg/m2 | Freq: Once | INTRAVENOUS | Status: AC
  Administered 2018-04-05: 856 mg via INTRAVENOUS
  Filled 2018-04-05: qty 42.8

## 2018-04-05 MED ORDER — SODIUM CHLORIDE 0.9% FLUSH
10.0000 mL | INTRAVENOUS | Status: DC | PRN
  Filled 2018-04-05: qty 10

## 2018-04-05 MED ORDER — DEXAMETHASONE SODIUM PHOSPHATE 10 MG/ML IJ SOLN
10.0000 mg | Freq: Once | INTRAMUSCULAR | Status: AC
  Administered 2018-04-05: 10 mg via INTRAVENOUS

## 2018-04-05 MED ORDER — DEXAMETHASONE SODIUM PHOSPHATE 10 MG/ML IJ SOLN
INTRAMUSCULAR | Status: AC
  Filled 2018-04-05: qty 1

## 2018-04-05 NOTE — Patient Instructions (Signed)
[facility] Discharge Instructions for Patients Receiving Chemotherapy  Today you received the following chemotherapy agents Oxaliplatin (Eloxatin), Leucovorin & Fluorouracil (Adrucil).  To help prevent nausea and vomiting after your treatment, we encourage you to take your nausea medication as prescribed.   If you develop nausea and vomiting that is not controlled by your nausea medication, call the clinic.   BELOW ARE SYMPTOMS THAT SHOULD BE REPORTED IMMEDIATELY:  *FEVER GREATER THAN 100.5 F  *CHILLS WITH OR WITHOUT FEVER  NAUSEA AND VOMITING THAT IS NOT CONTROLLED WITH YOUR NAUSEA MEDICATION  *UNUSUAL SHORTNESS OF BREATH  *UNUSUAL BRUISING OR BLEEDING  TENDERNESS IN MOUTH AND THROAT WITH OR WITHOUT PRESENCE OF ULCERS  *URINARY PROBLEMS  *BOWEL PROBLEMS  UNUSUAL RASH Items with * indicate a potential emergency and should be followed up as soon as possible.  Feel free to call the clinic should you have any questions or concerns. The clinic phone number is [phone].  Please show the CHEMO ALERT CARD at check-in to the Emergency Department and triage nurse.   

## 2018-04-05 NOTE — Progress Notes (Signed)
Per Dr. Burr Medico, okay to treat patient with Scr of 1.51 and give extra 500 ml NS.

## 2018-04-06 ENCOUNTER — Telehealth: Payer: Self-pay | Admitting: Hematology

## 2018-04-06 ENCOUNTER — Other Ambulatory Visit: Payer: 59

## 2018-04-06 NOTE — Telephone Encounter (Signed)
Appts scheduled patient notified date/time per 10/30 los

## 2018-04-07 ENCOUNTER — Inpatient Hospital Stay: Payer: 59 | Attending: Nurse Practitioner

## 2018-04-07 DIAGNOSIS — I4891 Unspecified atrial fibrillation: Secondary | ICD-10-CM | POA: Insufficient documentation

## 2018-04-07 DIAGNOSIS — N179 Acute kidney failure, unspecified: Secondary | ICD-10-CM | POA: Diagnosis not present

## 2018-04-07 DIAGNOSIS — Z452 Encounter for adjustment and management of vascular access device: Secondary | ICD-10-CM | POA: Diagnosis not present

## 2018-04-07 DIAGNOSIS — C186 Malignant neoplasm of descending colon: Secondary | ICD-10-CM | POA: Diagnosis present

## 2018-04-07 DIAGNOSIS — D6481 Anemia due to antineoplastic chemotherapy: Secondary | ICD-10-CM | POA: Insufficient documentation

## 2018-04-07 DIAGNOSIS — Z5111 Encounter for antineoplastic chemotherapy: Secondary | ICD-10-CM | POA: Insufficient documentation

## 2018-04-07 DIAGNOSIS — Z95828 Presence of other vascular implants and grafts: Secondary | ICD-10-CM

## 2018-04-07 DIAGNOSIS — G620 Drug-induced polyneuropathy: Secondary | ICD-10-CM | POA: Insufficient documentation

## 2018-04-07 DIAGNOSIS — D649 Anemia, unspecified: Secondary | ICD-10-CM | POA: Diagnosis not present

## 2018-04-07 DIAGNOSIS — Z7901 Long term (current) use of anticoagulants: Secondary | ICD-10-CM | POA: Insufficient documentation

## 2018-04-07 MED ORDER — SODIUM CHLORIDE 0.9% FLUSH
10.0000 mL | INTRAVENOUS | Status: DC | PRN
  Administered 2018-04-07: 10 mL
  Filled 2018-04-07: qty 10

## 2018-04-07 MED ORDER — HEPARIN SOD (PORK) LOCK FLUSH 100 UNIT/ML IV SOLN
500.0000 [IU] | Freq: Once | INTRAVENOUS | Status: AC | PRN
  Administered 2018-04-07: 500 [IU]
  Filled 2018-04-07: qty 5

## 2018-04-07 NOTE — Patient Instructions (Signed)

## 2018-04-11 ENCOUNTER — Ambulatory Visit (INDEPENDENT_AMBULATORY_CARE_PROVIDER_SITE_OTHER): Payer: 59 | Admitting: Gastroenterology

## 2018-04-11 ENCOUNTER — Encounter: Payer: Self-pay | Admitting: Gastroenterology

## 2018-04-11 ENCOUNTER — Telehealth: Payer: Self-pay

## 2018-04-11 VITALS — BP 110/78 | HR 85 | Ht 71.0 in | Wt 212.0 lb

## 2018-04-11 DIAGNOSIS — C189 Malignant neoplasm of colon, unspecified: Secondary | ICD-10-CM | POA: Diagnosis not present

## 2018-04-11 DIAGNOSIS — Z7901 Long term (current) use of anticoagulants: Secondary | ICD-10-CM | POA: Diagnosis not present

## 2018-04-11 MED ORDER — NA SULFATE-K SULFATE-MG SULF 17.5-3.13-1.6 GM/177ML PO SOLN: 1.0000 | Freq: Once | ORAL | 0 refills | Status: AC

## 2018-04-11 NOTE — Patient Instructions (Signed)
You have been scheduled for a colonoscopy. Please follow written instructions given to you at your visit today.  Please pick up your prep supplies at the pharmacy within the next 1-3 days. If you use inhalers (even only as needed), please bring them with you on the day of your procedure. Your physician has requested that you go to www.[email or web address] and enter the access code given to you at your visit today. This web site gives a general overview about your procedure. However, you should still follow specific instructions given to you by our office regarding your preparation for the procedure.  Thank you for choosing me and [facility].  Malcolm T. Stark, Jr., MD., FACG  

## 2018-04-11 NOTE — Telephone Encounter (Signed)
[facility] Pre-operative Risk Assessment     Request for surgical clearance:     Endoscopy Procedure  What type of surgery is being performed?     Colonoscopy  When is this surgery scheduled?     05/10/18  What type of clearance is required ?   Pharmacy  Are there any medications that need to be held prior to surgery and how long? Eliquis x 2 days  Practice name and name of physician performing surgery?      [facility]  What is your office phone and fax number?      Phone- [phone]  [phone]  Anesthesia type (None, local, MAC, general) ?       MAC

## 2018-04-11 NOTE — Progress Notes (Signed)
    History of Present Illness: This is a 72 year old male who is S/P left hemicolectomy and colostomy in 01/2018 for on obstructing T3, N1 colon adenocarcinoma.  He is accompanied by his wife.  Colonoscopy by Dr. [name] in August 2019 showed an obstructing descending colon mass and the colonoscopy was terminated at that point.  Sigmoid colon diverticulosis and internal hemorrhoids were also noted.  He is followed by [name], MD is currently receiving adjuvant chemotherapy.  Patient relates that Dr. [name] wanted him to proceed with colonoscopy prior to colostomy takedown.  His abdominal wound is healing by secondary intention.  Unfortunately I do not have office records available from Dr. [name] at this time.  He has no ongoing gastrointestinal complaints.  Current Medications, Allergies, Past Medical History, Past Surgical History, Family History and Social History were reviewed in [employer] record.  Physical Exam: General: Well developed, well nourished, no acute distress Head: Normocephalic and atraumatic Eyes:  sclerae anicteric, EOMI Ears: Normal auditory acuity Mouth: No deformity or lesions Lungs: Clear throughout to auscultation Heart: Regular rate and rhythm; no murmurs, rubs or bruits Abdomen: Soft, non tender and non distended.  Packed midline wound.  Left abdomen colostomy.  No masses, hepatosplenomegaly or hernias noted. Normal Bowel sounds Rectal: Not done Musculoskeletal: Symmetrical with no gross deformities  Pulses:  Normal pulses noted Extremities: No clubbing, cyanosis, edema or deformities noted Neurological: Alert oriented x 4, grossly nonfocal Psychological:  Alert and cooperative. Normal mood and affect   Assessment and Recommendations:  1.  T3, N1 descending colon adenocarcinoma status post left hemicolectomy and colostomy.  Abdominal wound healing by secondary intention.  Incomplete colonoscopy in August 2019 due to the obstructing mass.   Schedule colonoscopy via colostomy. The risks (including bleeding, perforation, infection, missed lesions, medication reactions and possible hospitalization or surgery if complications occur), benefits, and alternatives to colonoscopy with possible biopsy and possible polypectomy were discussed with the patient and they consent to proceed.  Request Dr. Darrel Hoover office notes.  2. Hold Eliquis 2 days before procedure - will instruct when and how to resume after procedure. Low but real risk of cardiovascular event such as heart attack, stroke, embolism, thrombosis or ischemia/infarct of other organs off Eliquis explained and need to seek urgent help if this occurs. The patient consents to proceed. Will communicate by phone or EMR with patient's prescribing provider to confirm that holding Eliquis is reasonable in this case.

## 2018-04-12 NOTE — Telephone Encounter (Signed)
Left a message for patient to return my call. 

## 2018-04-13 NOTE — Telephone Encounter (Signed)
Informed patient per Dr. [name] he can hold Eliquis 2 days prior to his procedure on 05/10/18. Patient verbalized understanding.

## 2018-04-14 ENCOUNTER — Encounter: Payer: 59 | Admitting: Gastroenterology

## 2018-04-18 ENCOUNTER — Inpatient Hospital Stay: Payer: 59

## 2018-04-18 ENCOUNTER — Encounter: Payer: Self-pay | Admitting: Nurse Practitioner

## 2018-04-18 ENCOUNTER — Inpatient Hospital Stay (HOSPITAL_BASED_OUTPATIENT_CLINIC_OR_DEPARTMENT_OTHER): Payer: 59 | Admitting: Nurse Practitioner

## 2018-04-18 VITALS — BP 123/76

## 2018-04-18 VITALS — BP 144/111 | HR 80 | Temp 98.3°F | Resp 18 | Ht 71.0 in | Wt 221.6 lb

## 2018-04-18 DIAGNOSIS — Z5111 Encounter for antineoplastic chemotherapy: Secondary | ICD-10-CM | POA: Diagnosis not present

## 2018-04-18 DIAGNOSIS — Z95828 Presence of other vascular implants and grafts: Secondary | ICD-10-CM

## 2018-04-18 DIAGNOSIS — G620 Drug-induced polyneuropathy: Secondary | ICD-10-CM | POA: Diagnosis not present

## 2018-04-18 DIAGNOSIS — I4891 Unspecified atrial fibrillation: Secondary | ICD-10-CM | POA: Diagnosis not present

## 2018-04-18 DIAGNOSIS — C186 Malignant neoplasm of descending colon: Secondary | ICD-10-CM

## 2018-04-18 DIAGNOSIS — T451X5A Adverse effect of antineoplastic and immunosuppressive drugs, initial encounter: Secondary | ICD-10-CM

## 2018-04-18 DIAGNOSIS — D649 Anemia, unspecified: Secondary | ICD-10-CM

## 2018-04-18 DIAGNOSIS — D490 Neoplasm of unspecified behavior of digestive system: Secondary | ICD-10-CM

## 2018-04-18 LAB — CMP (CANCER CENTER ONLY)
ALK PHOS: 110 U/L (ref 38–126)
ALT: 23 U/L (ref 0–44)
ANION GAP: 9 (ref 5–15)
AST: 21 U/L (ref 15–41)
Albumin: 3.5 g/dL (ref 3.5–5.0)
BUN: 23 mg/dL (ref 8–23)
CALCIUM: 9.1 mg/dL (ref 8.9–10.3)
CHLORIDE: 106 mmol/L (ref 98–111)
CO2: 26 mmol/L (ref 22–32)
Creatinine: 1.4 mg/dL — ABNORMAL HIGH (ref 0.61–1.24)
GFR, EST AFRICAN AMERICAN: 57 mL/min — AB (ref >60)
GFR, Estimated: 49 mL/min — ABNORMAL LOW (ref >60)
Glucose, Bld: 99 mg/dL (ref 70–99)
Potassium: 4.1 mmol/L (ref 3.5–5.1)
SODIUM: 141 mmol/L (ref 135–145)
Total Bilirubin: 0.5 mg/dL (ref 0.3–1.2)
Total Protein: 6.6 g/dL (ref 6.5–8.1)

## 2018-04-18 LAB — IRON AND TIBC
IRON: 66 ug/dL (ref 42–163)
Saturation Ratios: 22 % (ref 20–55)
TIBC: 303 ug/dL (ref 202–409)
UIBC: 236 ug/dL (ref 117–376)

## 2018-04-18 LAB — FERRITIN: Ferritin: 177 ng/mL (ref 24–336)

## 2018-04-18 LAB — CBC WITH DIFFERENTIAL (CANCER CENTER ONLY)
Abs Immature Granulocytes: 0.01 10*3/uL (ref 0.00–0.07)
BASOS ABS: 0 10*3/uL (ref 0.0–0.1)
Basophils Relative: 1 %
Eosinophils Absolute: 0.1 10*3/uL (ref 0.0–0.5)
Eosinophils Relative: 1 %
HEMATOCRIT: 34.5 % — AB (ref 39.0–52.0)
Hemoglobin: 11 g/dL — ABNORMAL LOW (ref 13.0–17.0)
IMMATURE GRANULOCYTES: 0 %
LYMPHS ABS: 1.9 10*3/uL (ref 0.7–4.0)
Lymphocytes Relative: 29 %
MCH: 29.6 pg (ref 26.0–34.0)
MCHC: 31.9 g/dL (ref 30.0–36.0)
MCV: 93 fL (ref 80.0–100.0)
Monocytes Absolute: 0.6 10*3/uL (ref 0.1–1.0)
Monocytes Relative: 9 %
NEUTROS PCT: 60 %
NRBC: 0 % (ref 0.0–0.2)
Neutro Abs: 3.9 10*3/uL (ref 1.7–7.7)
PLATELETS: 169 10*3/uL (ref 150–400)
RBC: 3.71 MIL/uL — AB (ref 4.22–5.81)
RDW: 18.8 % — ABNORMAL HIGH (ref 11.5–15.5)
WBC Count: 6.5 10*3/uL (ref 4.0–10.5)

## 2018-04-18 MED ORDER — DEXAMETHASONE SODIUM PHOSPHATE 10 MG/ML IJ SOLN
INTRAMUSCULAR | Status: AC
  Filled 2018-04-18: qty 1

## 2018-04-18 MED ORDER — PALONOSETRON HCL INJECTION 0.25 MG/5ML
0.2500 mg | Freq: Once | INTRAVENOUS | Status: AC
  Administered 2018-04-18: 0.25 mg via INTRAVENOUS

## 2018-04-18 MED ORDER — LEUCOVORIN CALCIUM INJECTION 350 MG
400.0000 mg/m2 | Freq: Once | INTRAVENOUS | Status: AC
  Administered 2018-04-18: 856 mg via INTRAVENOUS
  Filled 2018-04-18: qty 42.8

## 2018-04-18 MED ORDER — DEXTROSE 5 % IV SOLN
Freq: Once | INTRAVENOUS | Status: AC
  Administered 2018-04-18: 10:00:00 via INTRAVENOUS
  Filled 2018-04-18: qty 250

## 2018-04-18 MED ORDER — DEXAMETHASONE SODIUM PHOSPHATE 10 MG/ML IJ SOLN
10.0000 mg | Freq: Once | INTRAMUSCULAR | Status: AC
  Administered 2018-04-18: 10 mg via INTRAVENOUS

## 2018-04-18 MED ORDER — FLUOROURACIL CHEMO INJECTION 2.5 GM/50ML
400.0000 mg/m2 | Freq: Once | INTRAVENOUS | Status: AC
  Administered 2018-04-18: 850 mg via INTRAVENOUS
  Filled 2018-04-18: qty 17

## 2018-04-18 MED ORDER — SODIUM CHLORIDE 0.9 % IV SOLN
2400.0000 mg/m2 | INTRAVENOUS | Status: DC
  Administered 2018-04-18: 5150 mg via INTRAVENOUS
  Filled 2018-04-18: qty 103

## 2018-04-18 MED ORDER — SODIUM CHLORIDE 0.9% FLUSH
10.0000 mL | INTRAVENOUS | Status: DC | PRN
  Administered 2018-04-18: 10 mL
  Filled 2018-04-18: qty 10

## 2018-04-18 MED ORDER — PALONOSETRON HCL INJECTION 0.25 MG/5ML
INTRAVENOUS | Status: AC
  Filled 2018-04-18: qty 5

## 2018-04-18 NOTE — Patient Instructions (Signed)
[facility] Discharge Instructions for Patients Receiving Chemotherapy  Today you received the following chemotherapy agents  Leucovorin & Fluorouracil (Adrucil).   To help prevent nausea and vomiting after your treatment, we encourage you to take your nausea medication as prescribed.   If you develop nausea and vomiting that is not controlled by your nausea medication, call the clinic.   BELOW ARE SYMPTOMS THAT SHOULD BE REPORTED IMMEDIATELY:  *FEVER GREATER THAN 100.5 F  *CHILLS WITH OR WITHOUT FEVER  NAUSEA AND VOMITING THAT IS NOT CONTROLLED WITH YOUR NAUSEA MEDICATION  *UNUSUAL SHORTNESS OF BREATH  *UNUSUAL BRUISING OR BLEEDING  TENDERNESS IN MOUTH AND THROAT WITH OR WITHOUT PRESENCE OF ULCERS  *URINARY PROBLEMS  *BOWEL PROBLEMS  UNUSUAL RASH Items with * indicate a potential emergency and should be followed up as soon as possible.  Feel free to call the clinic should you have any questions or concerns. The clinic phone number is [phone].  Please show the [facility] at check-in to the Emergency Department and triage nurse.

## 2018-04-18 NOTE — Progress Notes (Signed)
[facility]  [phone] [phone]  Clinic Follow up Note   Patient Care Team: [name], MD as PCP - General (Family Medicine) [name], MD as Consulting Physician (Cardiology) [name], MD as Consulting Physician (Gastroenterology) 04/18/2018  SUMMARY OF ONCOLOGIC HISTORY: Oncology History   Cancer Staging Cancer of left colon [facility]) Staging form: Colon and Rectum, AJCC 8th Edition - Pathologic stage from 01/11/2018: Stage IIIB (pT3, pN1c, cM0) - Signed by [name], MD on 01/16/2018       Cancer of left colon ([place])   01/10/2018 Imaging    CT AP W Contrast 01/10/18  IMPRESSION: Irregular soft tissue density causing stricture of the mid descending colon likely the site of obstruction for the dilated small bowel. 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 No evidence of perforation.  Equivocal findings involving the appendix measuring 1.2 cm at the appendiceal tip with mucosal enhancement. No adjacent free fluid or inflammatory change. Findings are nonspecific, but can be seen with early acute appendicitis.  Mild prostatic enlargement. Increased density over the posterior bladder base likely due to the large prostatic impression although cannot completely exclude a bladder mass. Urology protocol CT or ultrasound may be helpful for better evaluation.  Mild cholelithiasis.  Stable 1.5 cm cystic structure over the lower pole right kidney likely slightly hyperdense cyst.  Diverticulosis of the colon.  Aortic Atherosclerosis (ICD10-I70.0).      01/11/2018 Cancer Staging    Staging form: Colon and Rectum, AJCC 8th Edition - Pathologic stage from 01/11/2018: Stage IIIB (pT3, pN1c, cM0) - Signed by [name], MD on 01/16/2018    01/11/2018 Surgery    LEFT COLON RESECTION, TAKEDOWN SPLENIC FLEXURE, COLOSTOMY by Dr. [name]     01/11/2018 Procedure    Colonoscopy 01/11/18 by Dr. [name]  - Malignant completely obstructing  tumor in the mid descending colon. Tattooed. - Diverticulosis in the sigmoid colon. - Non-bleeding internal hemorrhoids. - No specimens collected.    01/11/2018 Pathology Results    Diagnosis 01/11/18  1. Colon, segmental resection for tumor, descending colon - INVASIVE COLORECTAL ADENOCARCINOMA, 4 CM. - TUMOR EXTENDS INTO PERICOLONIC CONNECTIVE TISSUE. - TUMOR FOCALLY INVOLVES RADIAL MARGIN. - ONE MESENTERIC TUMOR DEPOSIT. - THIRTEEN BENIGN LYMPH NODES (0/13). 2. Colon, segmental resection, splenic flexure - BENIGN COLON. - NO EVIDENCE OF MALIGNANCY .    01/11/2018 Tumor Marker    Baseline CEA at 3.4    01/16/2018 Initial Diagnosis    Cancer of left colon ([place])    01/23/2018 Imaging    CT CHEST WO CONTRAST IMPRESSION: 1. No evidence for metastatic disease within the chest. 2. Small left pleural effusion with underlying opacities which may represent atelectasis. Right basilar atelectasis. 3. Few foci of gas within the upper abdomen in the omentum with surrounding fat stranding, likely postsurgical 4. Aortic Atherosclerosis (ICD10-I70.0).    03/07/2018 -  Chemotherapy    The patient had palonosetron (ALOXI) injection 0.25 mg, 0.25 mg, Intravenous,  Once, 4 of 12 cycles Administration: 0.25 mg (03/08/2018), 0.25 mg (03/22/2018), 0.25 mg (04/05/2018) leucovorin 856 mg in dextrose 5 % 250 mL infusion, 400 mg/m2 = 856 mg, Intravenous,  Once, 4 of 12 cycles Administration: 856 mg (03/08/2018), 856 mg (03/22/2018), 856 mg (04/05/2018) oxaliplatin (ELOXATIN) 180 mg in dextrose 5 % 500 mL chemo infusion, 85 mg/m2 = 180 mg, Intravenous,  Once, 3 of 11 cycles Administration: 180 mg (03/08/2018), 180 mg (03/22/2018), 180 mg (04/05/2018) fluorouracil (ADRUCIL) chemo injection 850 mg, 400 mg/m2 = 850  mg, Intravenous,  Once, 4 of 12 cycles Administration: 850 mg (03/08/2018), 850 mg (03/22/2018), 850 mg (04/05/2018) fluorouracil (ADRUCIL) 5,150 mg in sodium chloride 0.9 % 147 mL chemo infusion, 2,400  mg/m2 = 5,150 mg, Intravenous, 1 Day/Dose, 4 of 12 cycles Administration: 5,150 mg (03/08/2018), 5,150 mg (03/22/2018), 5,150 mg (04/05/2018)  for chemotherapy treatment.     03/19/2018 Imaging    03/19/2018 CT AP IMPRESSION: 1. Interval partial left hemicolectomy and descending colostomy. 2. Heterogeneous soft tissue density along the left anterior renal fascia is most likely postoperative (favor fat necrosis). No well-defined fluid collection. 3. Mild left lower quadrant edema, new since 01/10/2018. This could be postoperative. Superimposed sigmoid diverticulitis and/or cystitis cannot be excluded. 4. Subtle hyperenhancing nodule within the anterior bladder dome cannot be excluded. Consider nonemergent cystoscopy. When this is performed, recommend attention to the left ureterovesicular junction and distal left ureter to evaluate questionable soft tissue fullness. 5. Cholelithiasis. 6.  Aortic Atherosclerosis (ICD10-I70.0). 7. Prostatomegaly.   CURRENT THERAPY: adjuvant FOLFOX q2 weeks x6 months   INTERVAL HISTORY: Dave Vaughn returns for follow up and next cycle FOLFOX as scheduled. He completed cycle 3 on 04/05/18. He was seen by Dr. [name] Plan GI to discuss repeat colonoscopy which is currently planned for 12/4. He reports last cycle went "terrible." He has moderate to severe cold sensitivity lasting nearly 2 weeks. His hands shake when he touches something cold. Writing has become difficult since last treatment. His balance is off, he fell and had trouble getting up. He cut his knee. Balance issues have worsened over last 2 treatments. He has noticed a decline in his quality of life due to these symptoms.  He has no taste, but forces himself to eat and drink. Mouth is dry without sores. He is thirsty often. He has intermittent nausea well managed with zofran which he takes anywhere from 0 to 2 times per day. Stool fluctuates from constipation to diarrhea, empties bag 3-4 times per day. No  blood in stool. He is hoping to have colostomy reversed as soon as possible. He will see Dr. Dema Severin on 11/20. He saw urology last week who recommends he continue flomax and will see him in 6 months, he plans to check PSA that visit. He is no longer on antibiotics, denies dysuria or hematuria. Denies fever, chills, cough, chest pain, dyspnea, or leg edema.    MEDICAL HISTORY:  Past Medical History:  Diagnosis Date  . Adenocarcinoma, colon ([place])   . Anemia    taking iron supplements  . Atrial fibrillation with RVR ([place])   . Diverticulitis   . Dysrhythmia    afib  . History of kidney stones   . Kidney stones     SURGICAL HISTORY: Past Surgical History:  Procedure Laterality Date  . ANKLE SURGERY Left    when he was in college  . COLON RESECTION N/A 01/11/2018   Procedure: LEFT COLON RESECTION, TAKEDOWN SPLENIC FLEXURE, COLOSTOMY;  Surgeon: [name], MD;  Location: [facility];  Service: General;  Laterality: N/A;  . COLONOSCOPY  01/11/2018   Procedure: COLONOSCOPY;  Surgeon: Jackquline Denmark, MD;  Location: [facility];  Service: Endoscopy;;  . PORTACATH PLACEMENT Right 03/07/2018   Procedure: INSERTION PORT-A-CATH RIGHT SUBCLAVIAN;  Surgeon: [name], MD;  Location: [facility];  Service: General;  Laterality: Right;  . thumb surgery   2018   cyst removal    I have reviewed the social history and family history with the patient and they are unchanged from previous note.  ALLERGIES:  has No Known Allergies.  MEDICATIONS:  Current Outpatient Medications  Medication Sig Dispense Refill  . acetaminophen (TYLENOL) 500 MG tablet Take 1,000 mg by mouth every 6 (six) hours as needed (for pain.).    [name] ALPRAZolam (XANAX) 0.25 MG tablet Take 1 tablet (0.25 mg total) by mouth at bedtime as needed for anxiety. 20 tablet 0  . amiodarone (PACERONE) 200 MG tablet Take 1 tablet (200 mg total) by mouth 2 (two) times daily. 60 tablet 0  . apixaban (ELIQUIS) 5 MG TABS tablet Take 1 tablet (5 mg total) by  mouth 2 (two) times daily. 60 tablet 0  . diltiazem (CARTIA XT) 240 MG 24 hr capsule Take 1 capsule (240 mg total) by mouth daily. 30 capsule 11  . docusate sodium (COLACE) 100 MG capsule Take 100 mg by mouth 2 (two) times daily.    [name] lidocaine-prilocaine (EMLA) cream Apply to affected area once 30 g 3  . Multiple Vitamins-Iron (MULTIVITAMIN/IRON PO) Take 1 tablet by mouth 2 (two) times daily.    . ondansetron (ZOFRAN) 8 MG tablet Take 1 tablet (8 mg total) by mouth 2 (two) times daily as needed for refractory nausea / vomiting. Start on day 3 after chemotherapy. 30 tablet 1  . prochlorperazine (COMPAZINE) 10 MG tablet Take 1 tablet (10 mg total) by mouth every 6 (six) hours as needed (Nausea or vomiting). 30 tablet 1  . promethazine (PHENERGAN) 25 MG tablet Take 1 tablet (25 mg total) by mouth every 8 (eight) hours as needed for nausea or vomiting. 30 tablet 1  . tamsulosin (FLOMAX) 0.4 MG CAPS capsule Take 0.4 mg by mouth.     No current facility-administered medications for this visit.    Facility-Administered Medications Ordered in Other Visits  Medication Dose Route Frequency Provider Last Rate Last Dose  . fluorouracil (ADRUCIL) 5,150 mg in sodium chloride 0.9 % 147 mL chemo infusion  2,400 mg/m2 (Treatment Plan Recorded) Intravenous 1 day or 1 dose [name], MD      . fluorouracil (ADRUCIL) chemo injection 850 mg  400 mg/m2 (Treatment Plan Recorded) Intravenous Once [name], MD      . leucovorin 856 mg in dextrose 5 % 250 mL infusion  400 mg/m2 (Treatment Plan Recorded) Intravenous Once [name], MD 195 mL/hr at 04/18/18 1033 856 mg at 04/18/18 1033    PHYSICAL EXAMINATION: ECOG PERFORMANCE STATUS: 1 - Symptomatic but completely ambulatory  Vitals:   04/18/18 0844  BP: (!) 144/111  Pulse: 80  Resp: 18  Temp: 98.3 F (36.8 C)  SpO2: 99%   Filed Weights   04/18/18 0844  Weight: 221 lb 9.6 oz (100.5 kg)    GENERAL:alert, no distress and comfortable SKIN: no rashes or  significant lesions EYES:  sclera clear OROPHARYNX:no thrush or ulcers LYMPH:  no palpable cervical or supraclavicular lymphadenopathy LUNGS: clear to auscultation with normal breathing effort HEART: regular rate & rhythm, no lower extremity edema ABDOMEN:abdomen soft, non-tender and normal bowel sounds. Colostomy in place. Midline incision covered with dressing c/d/i Musculoskeletal:no cyanosis of digits and no clubbing  NEURO: alert & oriented x 3 with fluent speech, no focal motor deficits. Mildly decreased vibratory sense to fingers and toes per tuning fork exam PAC without erythema    LABORATORY DATA:  I have reviewed the data as listed CBC Latest Ref Rng & Units 04/18/2018 04/05/2018 03/22/2018  WBC 4.0 - 10.5 K/uL 6.5 6.3 4.4  Hemoglobin 13.0 - 17.0 g/dL 11.0(L) 11.2(L) 10.4(L)  Hematocrit  39.0 - 52.0 % 34.5(L) 34.8(L) 32.7(L)  Platelets 150 - 400 K/uL 169 236 184     CMP Latest Ref Rng & Units 04/18/2018 04/05/2018 03/22/2018  Glucose 70 - 99 mg/dL 99 102(H) 122(H)  BUN 8 - 23 mg/dL _0 Creatinine 0.61 - 1.24 mg/dL 1.40(H) 1.51(H) 1.26(H)  Sodium 135 - 145 mmol/L 141 142 142  Potassium 3.5 - 5.1 mmol/L 4.1 4.5 4.2  Chloride 98 - 111 mmol/L 106 105 103  CO2 22 - 32 mmol/L _1 Calcium 8.9 - 10.3 mg/dL 9.1 9.6 9.5  Total Protein 6.5 - 8.1 g/dL 6.6 7.0 7.4  Total Bilirubin 0.3 - 1.2 mg/dL 0.5 0.8 0.6  Alkaline Phos 38 - 126 U/L 110 121 137(H)  AST 15 - 41 U/L _2 ALT 0 - 44 U/L 23 25 41      RADIOGRAPHIC STUDIES: I have personally reviewed the radiological images as listed and agreed with the findings in the report. No results found.   ASSESSMENT & PLAN: Dave Vaughn a 72 y.o.Caucasianmalewith a history of diverticulitis who presented to the ED for abdominal pain and was found to havecancer of the descending colon.  1. Cancer of left colon,adenocarcinoma, stage IIIB(pT3N1cM0), MSI-stable -Mr. Beagle appears stable. Abdominal wound  continues to heal; he will f/u with Dr. [name] this month to discuss colostomy reversal. He had incomplete colonoscopy previously, due to obstructing mass, and has plans to undergo repeat colonoscopy 12/4 per Dr. [name] Plan. -He completed 3 cycles adjuvant FOLFOX at full dose, he tolerated first and second cycles very well overall, except mild nausea and fatigue. After cycle 3 he developed significant neuropathy and cold sensitivity; now with functional difficulties, balance issues, and fall. Tuning fork exam shows mild sensory deficit. His quality of life has decreased due to these issues and he is considering discontinuation of therapy early if symptoms do not improve.  -I recommend to hold oxaliplatin today and continue 5FU/leucovorin. He agrees.  -He will return for f/u in 2 weeks with next cycle, he will discuss further treatment with Dr. Burr Medico -I recommend PT referral for balance issue, he agrees.   2. Chemotherapy induced peripheral neuropathy, G2  -he developed symptoms after cycle 3, likely secondary to oxaliplatin  -now with functional difficulties, balance issues, and fall. Tuning fork exam shows mild sensory deficit. His quality of life has decreased due to these issues and he is considering discontinuation of therapy early if symptoms do not improve.  -I recommend to hold oxaliplatin today and continue 5FU/leucovorin. He agrees.    3. AFib, found during hospitalization - on apixaban, amiodarone, diltiazem followed by Dr. [name] -01/12/18 ECHO shows EF of 55-60% with moderate mitral valve regurgitation  4. Anemia -He had a mild anemia after colon surgery.On 02/28/2018 labs showed Hg on 10.8,worse than before. -iron studies adequate, he takes multivitamin with iron -Hgb is stable, iron studies in normal range. Continue oral iron and f/u closely   5. Urinary retention and UTI -He had a ED visit on February 24, 2018, due to the urinary retention and a UTI,he completed  antibiotics. -He is also on Flomax now -he has been to ED frequently recently, on 10/13, 10/15, and 10/16 seen initially for urinary retention, UA grew >100,000 colonies klebsiella pneumonia; foley was placed on 10/13, given rocephin IV and po cefpodoxime (vantin) home rx. Presented on 10/15 with pain, foley was d/c'd and eventually replaced on 10/16 per urologist. Foley will remain for 2 weeks,  he continues antibiotics.  -I reviewed his risk of recurrent UTI is high with indwelling foley and being on chemotherapy. Foley has been removed. Voiding without difficulty. No recurrent dysuria or hematuria   -Continue f/u with urology, next f/u in 6 months with PSA.    PLAN: -Labs reviewed -Proceed with cycle 4 adjuvant chemo, hold oxaliplatin due to neuropathy; leucovorin and 5FU only today  -f/u in 2 weeks with Dr. Burr Medico for next cycle and to discuss further treatment  -Pt agreed to PT referral for balance issues, referral placed today -Recheck BP in infusion  -f/u with cardiology, PCP, urology    Orders Placed This Encounter  Procedures  . Ambulatory referral to Physical Therapy    Referral Priority:   Routine    Referral Type:   Physical Medicine    Referral Reason:   Specialty Services Required    Requested Specialty:   Physical Therapy   All questions were answered. The patient knows to call the clinic with any problems, questions or concerns. No barriers to learning was detected. I spent 20 minutes counseling the patient face to face. The total time spent in the appointment was 25 minutes and more than 50% was on counseling and review of test results     Alla Feeling, NP 04/18/18

## 2018-04-19 ENCOUNTER — Other Ambulatory Visit: Payer: 59

## 2018-04-20 ENCOUNTER — Ambulatory Visit: Payer: 59 | Admitting: Nurse Practitioner

## 2018-04-20 ENCOUNTER — Inpatient Hospital Stay: Payer: 59

## 2018-04-20 ENCOUNTER — Other Ambulatory Visit: Payer: 59

## 2018-04-20 ENCOUNTER — Ambulatory Visit: Payer: 59

## 2018-04-20 VITALS — BP 126/78 | HR 65 | Temp 97.9°F | Resp 18

## 2018-04-20 DIAGNOSIS — Z5111 Encounter for antineoplastic chemotherapy: Secondary | ICD-10-CM | POA: Diagnosis not present

## 2018-04-20 DIAGNOSIS — C186 Malignant neoplasm of descending colon: Secondary | ICD-10-CM

## 2018-04-20 MED ORDER — SODIUM CHLORIDE 0.9% FLUSH
10.0000 mL | INTRAVENOUS | Status: DC | PRN
  Administered 2018-04-20: 10 mL
  Filled 2018-04-20: qty 10

## 2018-04-20 MED ORDER — HEPARIN SOD (PORK) LOCK FLUSH 100 UNIT/ML IV SOLN
500.0000 [IU] | Freq: Once | INTRAVENOUS | Status: AC | PRN
  Administered 2018-04-20: 500 [IU]
  Filled 2018-04-20: qty 5

## 2018-04-24 ENCOUNTER — Telehealth: Payer: Self-pay | Admitting: Physical Therapy

## 2018-04-29 NOTE — Progress Notes (Signed)
[place]  [phone] [phone]  Clinic Follow up Note   Patient Care Team: [name], MD as PCP - General (Family Medicine) [name], MD as Consulting Physician (Cardiology) [name], MD as Consulting Physician (Gastroenterology) 05/01/2018  Chief Complaint: F/u on colon cancer  SUMMARY OF ONCOLOGIC HISTORY: Oncology History   Cancer Staging Cancer of left colon [facility]) Staging form: Colon and Rectum, AJCC 8th Edition - Pathologic stage from 01/11/2018: Stage IIIB (pT3, pN1c, cM0) - Signed by [name], MD on 01/16/2018       Cancer of left colon ([place])   01/10/2018 Imaging    CT AP W Contrast 01/10/18  IMPRESSION: Irregular soft tissue density causing stricture of the mid descending colon likely the site of obstruction for the dilated small bowel. 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 No evidence of perforation.  Equivocal findings involving the appendix measuring 1.2 cm at the appendiceal tip with mucosal enhancement. No adjacent free fluid or inflammatory change. Findings are nonspecific, but can be seen with early acute appendicitis.  Mild prostatic enlargement. Increased density over the posterior bladder base likely due to the large prostatic impression although cannot completely exclude a bladder mass. Urology protocol CT or ultrasound may be helpful for better evaluation.  Mild cholelithiasis.  Stable 1.5 cm cystic structure over the lower pole right kidney likely slightly hyperdense cyst.  Diverticulosis of the colon.  Aortic Atherosclerosis (ICD10-I70.0).      01/11/2018 Cancer Staging    Staging form: Colon and Rectum, AJCC 8th Edition - Pathologic stage from 01/11/2018: Stage IIIB (pT3, pN1c, cM0) - Signed by [name], MD on 01/16/2018    01/11/2018 Surgery    LEFT COLON RESECTION, TAKEDOWN SPLENIC FLEXURE, COLOSTOMY by Dr. [name]     01/11/2018 Procedure    Colonoscopy 01/11/18 by Dr. [name]    - Malignant completely obstructing tumor in the mid descending colon. Tattooed. - Diverticulosis in the sigmoid colon. - Non-bleeding internal hemorrhoids. - No specimens collected.    01/11/2018 Pathology Results    Diagnosis 01/11/18  1. Colon, segmental resection for tumor, descending colon - INVASIVE COLORECTAL ADENOCARCINOMA, 4 CM. - TUMOR EXTENDS INTO PERICOLONIC CONNECTIVE TISSUE. - TUMOR FOCALLY INVOLVES RADIAL MARGIN. - ONE MESENTERIC TUMOR DEPOSIT. - THIRTEEN BENIGN LYMPH NODES (0/13). 2. Colon, segmental resection, splenic flexure - BENIGN COLON. - NO EVIDENCE OF MALIGNANCY .    01/11/2018 Tumor Marker    Baseline CEA at 3.4    01/16/2018 Initial Diagnosis    Cancer of left colon ([place])    01/23/2018 Imaging    CT CHEST WO CONTRAST IMPRESSION: 1. No evidence for metastatic disease within the chest. 2. Small left pleural effusion with underlying opacities which may represent atelectasis. Right basilar atelectasis. 3. Few foci of gas within the upper abdomen in the omentum with surrounding fat stranding, likely postsurgical 4. Aortic Atherosclerosis (ICD10-I70.0).    03/07/2018 -  Chemotherapy    The patient had palonosetron (ALOXI) injection 0.25 mg, 0.25 mg, Intravenous,  Once, 4 of 4 cycles Administration: 0.25 mg (03/08/2018), 0.25 mg (03/22/2018), 0.25 mg (04/05/2018), 0.25 mg (04/18/2018) leucovorin 856 mg in dextrose 5 % 250 mL infusion, 400 mg/m2 = 856 mg, Intravenous,  Once, 5 of 12 cycles Administration: 856 mg (03/08/2018), 856 mg (03/22/2018), 856 mg (04/05/2018), 856 mg (04/18/2018), 856 mg (05/01/2018) oxaliplatin (ELOXATIN) 180 mg in dextrose 5 % 500 mL chemo infusion, 85 mg/m2 = 180 mg, Intravenous,  Once, 3 of 3 cycles Administration: 180 mg (  03/08/2018), 180 mg (03/22/2018), 180 mg (04/05/2018) fluorouracil (ADRUCIL) chemo injection 850 mg, 400 mg/m2 = 850 mg, Intravenous,  Once, 5 of 12 cycles Administration: 850 mg (03/08/2018), 850 mg (03/22/2018), 850 mg  (04/05/2018), 850 mg (04/18/2018), 850 mg (05/01/2018) fluorouracil (ADRUCIL) 5,150 mg in sodium chloride 0.9 % 147 mL chemo infusion, 2,400 mg/m2 = 5,150 mg, Intravenous, 1 Day/Dose, 5 of 12 cycles Administration: 5,150 mg (03/08/2018), 5,150 mg (03/22/2018), 5,150 mg (04/05/2018), 5,150 mg (04/18/2018), 5,150 mg (05/01/2018)  for chemotherapy treatment.     03/19/2018 Imaging    03/19/2018 CT AP IMPRESSION: 1. Interval partial left hemicolectomy and descending colostomy. 2. Heterogeneous soft tissue density along the left anterior renal fascia is most likely postoperative (favor fat necrosis). No well-defined fluid collection. 3. Mild left lower quadrant edema, new since 01/10/2018. This could be postoperative. Superimposed sigmoid diverticulitis and/or cystitis cannot be excluded. 4. Subtle hyperenhancing nodule within the anterior bladder dome cannot be excluded. Consider nonemergent cystoscopy. When this is performed, recommend attention to the left ureterovesicular junction and distal left ureter to evaluate questionable soft tissue fullness. 5. Cholelithiasis. 6.  Aortic Atherosclerosis (ICD10-I70.0). 7. Prostatomegaly.    CURRENT THERAPY: adjuvant FOLFOX q2 weeks x6 months.  Oxaliplatin held after cycle 3 due to neuropathy, 5-FU changed to Xeloda from cycle 5  INTERVAL HISTORY: Dave Vaughn is a 72 y.o. male who is here for follow-up. He was last seen by NP Lacie on 04/18/2018, and was noted to have had a "terrible" last chemo cycle.  Today, he is here with his wife. He states that it takes him up to 4 days to recover from chemo. His BM alternate between diarrhea and constipation due to laxative use. He has a metal taste in his mouth, but is trying to eat well. He also noticed hand tremor with treatment. His other main concern is neuropathy, which started after he was 4.  He has presented numbness of fingers and toes, has not been able to write well lately.   Pertinent  positives and negatives of review of systems are listed and detailed within the above HPI.   REVIEW OF SYSTEMS:   Constitutional: Denies fevers, chills or abnormal weight loss Eyes: Denies blurriness of vision Ears, nose, mouth, throat, and face: Denies mucositis or sore throat (+) metal taste Respiratory: Denies cough, dyspnea or wheezes Cardiovascular: Denies palpitation, chest discomfort or lower extremity swelling Gastrointestinal:  Denies nausea, heartburn (+) alternating constipation and diarrhea  Skin: Denies abnormal skin rashes Lymphatics: Denies new lymphadenopathy or easy bruising Neurological:Denies numbness, tingling or new weaknesses (+) hand tremor with treatment  Behavioral/Psych: Mood is stable, no new changes  All other systems were reviewed with the patient and are negative.  MEDICAL HISTORY:  Past Medical History:  Diagnosis Date  . Adenocarcinoma, colon ([place])   . Anemia    taking iron supplements  . Atrial fibrillation with RVR ([place])   . Diverticulitis   . Dysrhythmia    afib  . History of kidney stones   . Kidney stones     SURGICAL HISTORY: Past Surgical History:  Procedure Laterality Date  . ANKLE SURGERY Left    when he was in college  . COLON RESECTION N/A 01/11/2018   Procedure: LEFT COLON RESECTION, TAKEDOWN SPLENIC FLEXURE, COLOSTOMY;  Surgeon: [name], MD;  Location: [facility];  Service: General;  Laterality: N/A;  . COLONOSCOPY  01/11/2018   Procedure: COLONOSCOPY;  Surgeon: Jackquline Denmark, MD;  Location: [facility];  Service: Endoscopy;;  . Sol Passer  PLACEMENT Right 03/07/2018   Procedure: INSERTION PORT-A-CATH RIGHT SUBCLAVIAN;  Surgeon: [name], MD;  Location: [facility];  Service: General;  Laterality: Right;  . thumb surgery   2018   cyst removal    I have reviewed the social history and family history with the patient and they are unchanged from previous note.  ALLERGIES:  has No Known Allergies.  MEDICATIONS:  Current Outpatient  Medications  Medication Sig Dispense Refill  . acetaminophen (TYLENOL) 500 MG tablet Take 1,000 mg by mouth every 6 (six) hours as needed (for pain.).    [name] ALPRAZolam (XANAX) 0.25 MG tablet Take 1 tablet (0.25 mg total) by mouth at bedtime as needed for anxiety. 20 tablet 0  . amiodarone (PACERONE) 200 MG tablet Take 1 tablet (200 mg total) by mouth 2 (two) times daily. 60 tablet 0  . apixaban (ELIQUIS) 5 MG TABS tablet Take 1 tablet (5 mg total) by mouth 2 (two) times daily. 60 tablet 0  . diltiazem (CARTIA XT) 240 MG 24 hr capsule Take 1 capsule (240 mg total) by mouth daily. 30 capsule 11  . docusate sodium (COLACE) 100 MG capsule Take 100 mg by mouth 2 (two) times daily.    [name] lidocaine-prilocaine (EMLA) cream Apply to affected area once 30 g 3  . Multiple Vitamins-Iron (MULTIVITAMIN/IRON PO) Take 1 tablet by mouth 2 (two) times daily.    . ondansetron (ZOFRAN) 8 MG tablet Take 1 tablet (8 mg total) by mouth 2 (two) times daily as needed for refractory nausea / vomiting. Start on day 3 after chemotherapy. 30 tablet 1  . prochlorperazine (COMPAZINE) 10 MG tablet Take 1 tablet (10 mg total) by mouth every 6 (six) hours as needed (Nausea or vomiting). 30 tablet 1  . promethazine (PHENERGAN) 25 MG tablet Take 1 tablet (25 mg total) by mouth every 8 (eight) hours as needed for nausea or vomiting. 30 tablet 1  . tamsulosin (FLOMAX) 0.4 MG CAPS capsule Take 0.4 mg by mouth.    . capecitabine (XELODA) 500 MG tablet Take 5 tablets (2526m) in AM & 4 tabs (20013m in PM, immediately after food, for 14 days on, 7 days off, repeat every 21 days 126 tablet 0   No current facility-administered medications for this visit.     PHYSICAL EXAMINATION: ECOG PERFORMANCE STATUS: 2 - Symptomatic, <50% confined to bed  Vitals:   05/01/18 0935  BP: 119/89  Pulse: 85  Resp: 18  Temp: 98.4 F (36.9 C)  SpO2: 98%   Filed Weights   05/01/18 0935  Weight: 220 lb 9.6 oz (100.1 kg)    GENERAL:alert, no  distress and comfortable SKIN: skin color, texture, turgor are normal, no rashes or significant lesions EYES: normal, Conjunctiva are pink and non-injected, sclera clear OROPHARYNX:no exudate, no erythema and lips, buccal mucosa, and tongue normal  NECK: supple, thyroid normal size, non-tender, without nodularity LYMPH:  no palpable lymphadenopathy in the cervical, axillary or inguinal LUNGS: clear to auscultation and percussion with normal breathing effort HEART: regular rate & rhythm and no murmurs and no lower extremity edema ABDOMEN:abdomen soft, non-tender and normal bowel sounds (+) abdominal wound is healing, 6 cm in size Musculoskeletal:no cyanosis of digits and no clubbing  NEURO: alert & oriented x 3 with fluent speech, (+) moderately decreased sense of vibration in all 4 extremities.   LABORATORY DATA:  I have reviewed the data as listed CBC Latest Ref Rng & Units 05/01/2018 04/18/2018 04/05/2018  WBC 4.0 - 10.5 K/uL 6.5  6.5 6.3  Hemoglobin 13.0 - 17.0 g/dL 11.8(L) 11.0(L) 11.2(L)  Hematocrit 39.0 - 52.0 % 36.7(L) 34.5(L) 34.8(L)  Platelets 150 - 400 K/uL 195 169 236     CMP Latest Ref Rng & Units 05/01/2018 04/18/2018 04/05/2018  Glucose 70 - 99 mg/dL 97 99 102(H)  BUN 8 - 23 mg/dL _0 Creatinine 0.61 - 1.24 mg/dL 1.51(H) 1.40(H) 1.51(H)  Sodium 135 - 145 mmol/L 142 141 142  Potassium 3.5 - 5.1 mmol/L 4.2 4.1 4.5  Chloride 98 - 111 mmol/L 106 106 105  CO2 22 - 32 mmol/L _1 Calcium 8.9 - 10.3 mg/dL 9.2 9.1 9.6  Total Protein 6.5 - 8.1 g/dL 6.8 6.6 7.0  Total Bilirubin 0.3 - 1.2 mg/dL 0.4 0.5 0.8  Alkaline Phos 38 - 126 U/L 103 110 121  AST 15 - 41 U/L _2 ALT 0 - 44 U/L _3 RADIOGRAPHIC STUDIES: I have personally reviewed the radiological images as listed and agreed with the findings in the report. No results found.   ASSESSMENT & PLAN:  Dave Vaughn is a 72 y.o. male with history of   1. Cancer of left colon,adenocarcinoma,  stage IIIB(pT3N1cM0), MSI-stable -He has had completed surgical resection.  Due to the high risk of recurrence, I recommended adjuvant chemotherapy. -He is on FOLFOX and has been tolerating poorly.  Main side effect effects including fatigue, constipation/diarrhea, taste change, and peripheral neuropathy. -This worsening peripheral neuropathy, oxaliplatin has been held after cycle 3.  And I do not plan to return to him due to his grade 2 neuropathy -He feels quite fatigued and would like to stop chemo. I agree with stopping oxaliplatin, but encouraged him to continue 5-FU infusion, or switch to Xeloda.  We again reviewed the risk of recurrence and the benefit of adjuvant chemotherapy.  And, he agrees to continue 5-FU infusion today, and a change to Xeloda from next cycle.  We reviewed potential side effects of Xeloda, especially hand-foot syndrome, fatigue, etc, he voiced good understanding and agrees to proceed. -I advised him to take vitamin B complexes and exercise for his neuropathy. -Labs reviewed, CBC showed Hg 11.8. Cr 1.5, overall stable, will proceed with leucovorin and 5-FU infusion today. -10 in 2 weeks to start next cycle with Xeloda  2. Atrial Fibrillation -He is currently on Eliquis, rate controlled   3. Anemia, secondary to chemo  -Labs reviewed, CBC showed Hg 11.8,stable   4. AKI -His Cr has slightly elevated since he started chemo -Cr 1.5 today, overall stable  -I encourage him to drink fluids adequately   5. Abdominal wound  -Secondary to the surgical incision, is slowly.  His wife showed me the picture today, it's about 5cm long, with pinkish fresh tissue, no discharge though skin erythema.  6.  Peripheral neuropathy due to chemotherapy, grade 2 -Developed peripheral neuropathy after cycle 3 chemotherapy, decreased vibratory sensation on exam, and he is not able to write well -oxaliplatin has been held after cycle 3 -I encouraged him is to take a B complex -consider  neurontin if it gets worse   Plan  -Will stop Oxaliplatin (held since last cycle) -Labs reviewed, good for treatment with 5-FU/LV only -Will switch to Xeloda in 2 weeks -f/u in 2 weeks with lab and flush -Denyse Amass will discuss Xeloda with him    No problem-specific Assessment & Plan notes found for this encounter.   No orders of the defined types  were placed in this encounter.  All questions were answered. The patient knows to call the clinic with any problems, questions or concerns. No barriers to learning was detected.  I spent 25 minutes counseling the patient face to face. The total time spent in the appointment was 30 minutes and more than 50% was on counseling and review of test results  I, Noor Dweik am acting as scribe for Dr. [name].  I have reviewed the above documentation for accuracy and completeness, and I agree with the above.      [name], MD 05/01/2018

## 2018-05-01 ENCOUNTER — Inpatient Hospital Stay: Payer: 59

## 2018-05-01 ENCOUNTER — Telehealth: Payer: Self-pay | Admitting: Pharmacist

## 2018-05-01 ENCOUNTER — Telehealth: Payer: Self-pay

## 2018-05-01 ENCOUNTER — Encounter: Payer: Self-pay | Admitting: Hematology

## 2018-05-01 ENCOUNTER — Inpatient Hospital Stay (HOSPITAL_BASED_OUTPATIENT_CLINIC_OR_DEPARTMENT_OTHER): Payer: 59 | Admitting: Hematology

## 2018-05-01 VITALS — BP 119/89 | HR 85 | Temp 98.4°F | Resp 18 | Ht 71.0 in | Wt 220.6 lb

## 2018-05-01 DIAGNOSIS — N179 Acute kidney failure, unspecified: Secondary | ICD-10-CM

## 2018-05-01 DIAGNOSIS — I4891 Unspecified atrial fibrillation: Secondary | ICD-10-CM

## 2018-05-01 DIAGNOSIS — C186 Malignant neoplasm of descending colon: Secondary | ICD-10-CM

## 2018-05-01 DIAGNOSIS — D6481 Anemia due to antineoplastic chemotherapy: Secondary | ICD-10-CM

## 2018-05-01 DIAGNOSIS — Z5111 Encounter for antineoplastic chemotherapy: Secondary | ICD-10-CM | POA: Diagnosis not present

## 2018-05-01 DIAGNOSIS — Z7901 Long term (current) use of anticoagulants: Secondary | ICD-10-CM

## 2018-05-01 DIAGNOSIS — G620 Drug-induced polyneuropathy: Secondary | ICD-10-CM | POA: Diagnosis not present

## 2018-05-01 DIAGNOSIS — Z95828 Presence of other vascular implants and grafts: Secondary | ICD-10-CM

## 2018-05-01 DIAGNOSIS — D490 Neoplasm of unspecified behavior of digestive system: Secondary | ICD-10-CM

## 2018-05-01 LAB — CBC WITH DIFFERENTIAL (CANCER CENTER ONLY)
ABS IMMATURE GRANULOCYTES: 0.02 10*3/uL (ref 0.00–0.07)
Basophils Absolute: 0 10*3/uL (ref 0.0–0.1)
Basophils Relative: 1 %
Eosinophils Absolute: 0.2 10*3/uL (ref 0.0–0.5)
Eosinophils Relative: 2 %
HCT: 36.7 % — ABNORMAL LOW (ref 39.0–52.0)
HEMOGLOBIN: 11.8 g/dL — AB (ref 13.0–17.0)
IMMATURE GRANULOCYTES: 0 %
LYMPHS PCT: 31 %
Lymphs Abs: 2 10*3/uL (ref 0.7–4.0)
MCH: 30.1 pg (ref 26.0–34.0)
MCHC: 32.2 g/dL (ref 30.0–36.0)
MCV: 93.6 fL (ref 80.0–100.0)
MONO ABS: 0.6 10*3/uL (ref 0.1–1.0)
MONOS PCT: 9 %
NEUTROS ABS: 3.7 10*3/uL (ref 1.7–7.7)
NEUTROS PCT: 57 %
PLATELETS: 195 10*3/uL (ref 150–400)
RBC: 3.92 MIL/uL — ABNORMAL LOW (ref 4.22–5.81)
RDW: 18.5 % — ABNORMAL HIGH (ref 11.5–15.5)
WBC Count: 6.5 10*3/uL (ref 4.0–10.5)
nRBC: 0 % (ref 0.0–0.2)

## 2018-05-01 LAB — CMP (CANCER CENTER ONLY)
ALT: 17 U/L (ref 0–44)
ANION GAP: 10 (ref 5–15)
AST: 19 U/L (ref 15–41)
Albumin: 3.6 g/dL (ref 3.5–5.0)
Alkaline Phosphatase: 103 U/L (ref 38–126)
BILIRUBIN TOTAL: 0.4 mg/dL (ref 0.3–1.2)
BUN: 22 mg/dL (ref 8–23)
CHLORIDE: 106 mmol/L (ref 98–111)
CO2: 26 mmol/L (ref 22–32)
Calcium: 9.2 mg/dL (ref 8.9–10.3)
Creatinine: 1.51 mg/dL — ABNORMAL HIGH (ref 0.61–1.24)
GFR, Est AFR Am: 52 mL/min — ABNORMAL LOW (ref >60)
GFR, Estimated: 45 mL/min — ABNORMAL LOW (ref >60)
GLUCOSE: 97 mg/dL (ref 70–99)
POTASSIUM: 4.2 mmol/L (ref 3.5–5.1)
SODIUM: 142 mmol/L (ref 135–145)
TOTAL PROTEIN: 6.8 g/dL (ref 6.5–8.1)

## 2018-05-01 LAB — CEA (IN HOUSE-CHCC): CEA (CHCC-IN HOUSE): 4.93 ng/mL (ref 0.00–5.00)

## 2018-05-01 MED ORDER — CAPECITABINE 500 MG PO TABS: ORAL_TABLET | ORAL | 0 refills | Status: DC

## 2018-05-01 MED ORDER — SODIUM CHLORIDE 0.9% FLUSH
10.0000 mL | INTRAVENOUS | Status: DC | PRN
  Administered 2018-05-01: 10 mL
  Filled 2018-05-01: qty 10

## 2018-05-01 MED ORDER — SODIUM CHLORIDE 0.9 % IV SOLN
2400.0000 mg/m2 | INTRAVENOUS | Status: DC
  Administered 2018-05-01: 5150 mg via INTRAVENOUS
  Filled 2018-05-01: qty 103

## 2018-05-01 MED ORDER — PROCHLORPERAZINE MALEATE 10 MG PO TABS
ORAL_TABLET | ORAL | Status: AC
  Filled 2018-05-01: qty 1

## 2018-05-01 MED ORDER — DEXAMETHASONE SODIUM PHOSPHATE 10 MG/ML IJ SOLN
INTRAMUSCULAR | Status: AC
  Filled 2018-05-01: qty 1

## 2018-05-01 MED ORDER — PROCHLORPERAZINE MALEATE 10 MG PO TABS
10.0000 mg | ORAL_TABLET | Freq: Once | ORAL | Status: AC
  Administered 2018-05-01: 10 mg via ORAL

## 2018-05-01 MED ORDER — DEXAMETHASONE SODIUM PHOSPHATE 10 MG/ML IJ SOLN
10.0000 mg | Freq: Once | INTRAMUSCULAR | Status: AC
  Administered 2018-05-01: 10 mg via INTRAVENOUS

## 2018-05-01 MED ORDER — DEXTROSE 5 % IV SOLN
Freq: Once | INTRAVENOUS | Status: AC
  Administered 2018-05-01: 11:00:00 via INTRAVENOUS
  Filled 2018-05-01: qty 250

## 2018-05-01 MED ORDER — LEUCOVORIN CALCIUM INJECTION 350 MG
400.0000 mg/m2 | Freq: Once | INTRAVENOUS | Status: AC
  Administered 2018-05-01: 856 mg via INTRAVENOUS
  Filled 2018-05-01: qty 42.8

## 2018-05-01 MED ORDER — FLUOROURACIL CHEMO INJECTION 2.5 GM/50ML
400.0000 mg/m2 | Freq: Once | INTRAVENOUS | Status: AC
  Administered 2018-05-01: 850 mg via INTRAVENOUS
  Filled 2018-05-01: qty 17

## 2018-05-01 NOTE — Telephone Encounter (Signed)
Oral Oncology Pharmacist Encounter  Received notification about new prescription for Xeloda (capecitabine) for the continued adjuvant treatment of stage IIIB colon cancer, planned duration to complete 6 months of adjuvant treatment.  Patient originally started on adjuvant FOLFOX administered every 2 weeks, with plans for 6 months of treatment (12 planned cycles)  Patient noted to be tolerating treatment very poorly and requests to stop chemotherapy. He has completed 4 cycles of FOLFOX (2 months).  After discussion with MD today, patient agreeable to discontinue use of oxaliplatin and complete planned adjuvant course with Xeloda monotherapy. Xeloda monotherapy will be continued for and additional 5-6 cycles that are 3 weeks on length  Xeloda will be dosed at ~1000 mg/m2 BID for 14 days on, 7 days off, repeated every 21 days  Labs from 05/01/18 assessed, OK for treatment. SCr=1.51, est CrCl ~ 60 mL/min (wt=100.0kg)  Current medication list in Epic reviewed, moderate DDI with Xeloda and amiodarone identified:  Category C interaction: Xeloda carries an intermediate risk of QT prolongation, and may enhance the QT prolonging effects of amiodarone.  Recommendation is to monitor QT interval when these drugs are combined.  Electrolytes should be closely monitored during concomitent treatment.  EKGs performed in August 2019 showed QT interval as safe for addition of Xeloda.  It is noted that patient had no issues during treatment with infusional 5-fluorouracil.  Patient will be closely monitored.  No change to current therapy is indicated at this time.  Prescription has been e-scribed to the [facility] for benefits analysis and approval.  Oral Oncology Clinic will continue to follow for insurance authorization, copayment issues, initial counseling and start date.  Johny Drilling, PharmD, BCPS, BCOP  05/01/2018 11:44 AM Oral Oncology Clinic [phone]

## 2018-05-01 NOTE — Telephone Encounter (Signed)
Printed avs and calender of upcoming appointment. Per 11/25 los 

## 2018-05-01 NOTE — Patient Instructions (Signed)
[facility] Discharge Instructions for Patients Receiving Chemotherapy  Today you received the following chemotherapy agents  Leucovorin & Fluorouracil (Adrucil).   To help prevent nausea and vomiting after your treatment, we encourage you to take your nausea medication as prescribed.   If you develop nausea and vomiting that is not controlled by your nausea medication, call the clinic.   BELOW ARE SYMPTOMS THAT SHOULD BE REPORTED IMMEDIATELY:  *FEVER GREATER THAN 100.5 F  *CHILLS WITH OR WITHOUT FEVER  NAUSEA AND VOMITING THAT IS NOT CONTROLLED WITH YOUR NAUSEA MEDICATION  *UNUSUAL SHORTNESS OF BREATH  *UNUSUAL BRUISING OR BLEEDING  TENDERNESS IN MOUTH AND THROAT WITH OR WITHOUT PRESENCE OF ULCERS  *URINARY PROBLEMS  *BOWEL PROBLEMS  UNUSUAL RASH Items with * indicate a potential emergency and should be followed up as soon as possible.  Feel free to call the clinic should you have any questions or concerns. The clinic phone number is [phone].  Please show the [place] at check-in to the Emergency Department and triage nurse.

## 2018-05-01 NOTE — Progress Notes (Signed)
Per Dr. Burr Medico ok to treat with Scr 1.51

## 2018-05-02 ENCOUNTER — Telehealth: Payer: Self-pay

## 2018-05-02 NOTE — Telephone Encounter (Signed)
Oral Oncology Patient Advocate Encounter  Received notification from [employer] that prior authorization for Xeloda is required.  PA submitted on [email or web address] EOC ID: [id number] Status is pending  Oral Oncology Clinic will continue to follow.  Dave Vaughn Phone [phone] Fax [phone]

## 2018-05-03 ENCOUNTER — Inpatient Hospital Stay: Payer: 59

## 2018-05-03 VITALS — BP 130/75 | HR 89 | Temp 98.2°F | Resp 18

## 2018-05-03 DIAGNOSIS — C186 Malignant neoplasm of descending colon: Secondary | ICD-10-CM

## 2018-05-03 DIAGNOSIS — Z5111 Encounter for antineoplastic chemotherapy: Secondary | ICD-10-CM | POA: Diagnosis not present

## 2018-05-03 MED ORDER — SODIUM CHLORIDE 0.9% FLUSH
10.0000 mL | INTRAVENOUS | Status: DC | PRN
  Administered 2018-05-03: 10 mL
  Filled 2018-05-03: qty 10

## 2018-05-03 MED ORDER — CAPECITABINE 500 MG PO TABS: ORAL_TABLET | ORAL | 0 refills | Status: DC

## 2018-05-03 MED ORDER — HEPARIN SOD (PORK) LOCK FLUSH 100 UNIT/ML IV SOLN
500.0000 [IU] | Freq: Once | INTRAVENOUS | Status: AC | PRN
  Administered 2018-05-03: 500 [IU]
  Filled 2018-05-03: qty 5

## 2018-05-03 NOTE — Patient Instructions (Signed)
Implanted Port Home Guide An implanted port is a type of central line that is placed under the skin. Central lines are used to provide IV access when treatment or nutrition needs to be given through a person's veins. Implanted ports are used for long-term IV access. An implanted port may be placed because:  You need IV medicine that would be irritating to the small veins in your hands or arms.  You need long-term IV medicines, such as antibiotics.  You need IV nutrition for a long period.  You need frequent blood draws for lab tests.  You need dialysis.  Implanted ports are usually placed in the chest area, but they can also be placed in the upper arm, the abdomen, or the leg. An implanted port has two main parts:  Reservoir. The reservoir is round and will appear as a small, raised area under your skin. The reservoir is the part where a needle is inserted to give medicines or draw blood.  Catheter. The catheter is a thin, flexible tube that extends from the reservoir. The catheter is placed into a large vein. Medicine that is inserted into the reservoir goes into the catheter and then into the vein.  How will I care for my incision site? Do not get the incision site wet. Bathe or shower as directed by your health care provider. How is my port accessed? Special steps must be taken to access the port:  Before the port is accessed, a numbing cream can be placed on the skin. This helps numb the skin over the port site.  Your health care provider uses a sterile technique to access the port. ? Your health care provider must put on a mask and sterile gloves. ? The skin over your port is cleaned carefully with an antiseptic and allowed to dry. ? The port is gently pinched between sterile gloves, and a needle is inserted into the port.  Only "non-coring" port needles should be used to access the port. Once the port is accessed, a blood return should be checked. This helps ensure that the port  is in the vein and is not clogged.  If your port needs to remain accessed for a constant infusion, a clear (transparent) bandage will be placed over the needle site. The bandage and needle will need to be changed every week, or as directed by your health care provider.  Keep the bandage covering the needle clean and dry. Do not get it wet. Follow your health care provider's instructions on how to take a shower or bath while the port is accessed.  If your port does not need to stay accessed, no bandage is needed over the port.  What is flushing? Flushing helps keep the port from getting clogged. Follow your health care provider's instructions on how and when to flush the port. Ports are usually flushed with saline solution or a medicine called heparin. The need for flushing will depend on how the port is used.  If the port is used for intermittent medicines or blood draws, the port will need to be flushed: ? After medicines have been given. ? After blood has been drawn. ? As part of routine maintenance.  If a constant infusion is running, the port may not need to be flushed.  How long will my port stay implanted? The port can stay in for as long as your health care provider thinks it is needed. When it is time for the port to come out, surgery will be   done to remove it. The procedure is similar to the one performed when the port was put in. When should I seek immediate medical care? When you have an implanted port, you should seek immediate medical care if:  You notice a bad smell coming from the incision site.  You have swelling, redness, or drainage at the incision site.  You have more swelling or pain at the port site or the surrounding area.  You have a fever that is not controlled with medicine.  This information is not intended to replace advice given to you by your health care provider. Make sure you discuss any questions you have with your health care provider. Document  Released: 05/24/2005 Document Revised: 10/30/2015 Document Reviewed: 01/29/2013 [employer] Interactive Patient Education  2017 [employer]  

## 2018-05-03 NOTE — Telephone Encounter (Signed)
Oral Chemotherapy Pharmacist Encounter   Attempted to reach patient to provide update and offer for initial counseling on oral medication: Xeloda.  No answer. Left VM for patient to call back.   Received notification that Xeloda prescription must be filled at [facility] per insurance requirement. Xeloda prescription has been E scribed to the [facility] in [place], [place].  This information will be shared with patient when we are able to discuss initial counseling and coordinate medication acquisition.  Johny Drilling, PharmD, BCPS, BCOP  05/03/2018   1:22 PM Oral Oncology Clinic [phone]

## 2018-05-03 NOTE — Telephone Encounter (Signed)
Oral Oncology Patient Advocate Encounter  Prior Authorization for Xeloda has been approved.    PA# [id number] Effective dates: 05/02/18 through 10/31/18  Oral Oncology Clinic will continue to follow.   [place] Patient [place] Phone [phone] Fax [phone]

## 2018-05-05 ENCOUNTER — Telehealth: Payer: Self-pay

## 2018-05-05 NOTE — Telephone Encounter (Signed)
Received acknowledgement from [facility] for enrollment for Capecitabine. Sent for scan to chart.

## 2018-05-08 ENCOUNTER — Ambulatory Visit: Payer: Self-pay | Admitting: Surgery

## 2018-05-08 NOTE — H&P (Signed)
Madilyn Hook Documented: 05/08/2018 4:17 PM Location: [facility] Patient #: [id number] DOB: 1946/02/08 Married / Language: Cleophus Molt / Race: White Male  History of Present Illness [name] MD; 05/08/2018 5:52 PM) The patient is a 72 year old male who presents with colorectal cancer. 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      The patient returns to clinic after surgery, gradually improving. Pain from the incisions is fading away. His midline wound is nearly closed down. He is emptying his colostomy bag about 3 times a day. He is walking 2 miles a day. He is eating better. He is regaining some weight. He is urinating fine on Flomax. No new urinary issues. Due to follow up with him in 6 months.  He had increasing cold intolerance and neuropathies on oxaliplatin and wished that to be held. He is just on Xeloda-based chemotherapy. I believe he's near the end of his chemotherapy. They think they have only 2 more cycles to go. Denies nausea, constipation/diarrhea, worsening fatigue, high fevers, or other concerns. They are very interested in proceeding with colostomy takedown as soon as it is safe. Patient referred to me given my expertise in minimally invasive approaches.  ` Prior Note:  This is a very pleasant 72 year old man presenting over 3 months postop emergency colon resection for obstructing colon cancer. His wife is with him today. Dr. Burr Medico is his oncologist. Dr. Ailene Ards is his cardiologist. Dr. [name] Plan is his gastroenterologist. Dr. Ethlyn Gallery is his PCP. Dr. Lovena Neighbours is his urologist.  He presented on January 11, 2018 with abdominal distention and vomiting and new onset atrial fibrillation. CT scan showed a mass in the proximal descending colon with proximal obstruction. Sigmoidoscopy by Dr. [name] showed an apple core lesion at 50 cm. He immediately underwent  laparotomy, left colon resection, takedown splenic flexure, end colostomy. 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 13 lymph nodes were negative but there was a positive mesenteric peritoneal deposit.. Final staging is T3, N1c, stage IIIB. I placed a Port-A-Cath on March 07, 2018.  He is doing well. His midline wound is still granulating in but it is very shallow. Dr. Burr Medico has been giving him chemotherapy. He had a lot of side effects after the third round. He scheduled for chemotherapy again on November 25. They have not decided have any more rounds of chemotherapy he will get that that will be decided based on his tolerance. He remains in atrial fibrillation but has not been converted yet. He is on Eliquis.. Dr. [name] Plan plans completion colonoscopy on December 4. He had problems with urinary retention requiring Foley catheter. He saw Dr. Lovena Neighbours. He was started on Flomax and that has mostly resolved.  Exam today was good. Midline wound has shallow granulation tissue but it is healing in and I expect to have healing by secondary intention bilaterally January. He is not quite ready for colostomy closure. He needs to have his completion colonoscopy, he needs for his wound to heal, he needs to have his cardiac issues addressed, and we need to decide how much more chemotherapy he is going to receive.  He is referred to his cardiologist for cardiac risk assessment and cardiac clearance for general anesthesia for colostomy closure Hopefully his atrial fibrillation can be converted by then Completion colonoscopy December 4, Dr. [name] Plan We can take his Port-A-Cath out once Dr. Burr Medico is through with chemotherapy  In terms of technical aspects for colostomy closure, I advised him to have this done robotically. Minimal access will theoretically have less pain and quicker recovery and he agrees he will be referred to one of our robotic colorectal  surgeons I will standby   Pathology: Pathology Results Diagnosis 01/11/18 1. Colon, segmental resection for tumor, descending colon - INVASIVE COLORECTAL ADENOCARCINOMA, 4 CM. - TUMOR EXTENDS INTO PERICOLONIC CONNECTIVE TISSUE. - TUMOR FOCALLY INVOLVES RADIAL MARGIN. - ONE MESENTERIC TUMOR DEPOSIT. - THIRTEEN BENIGN LYMPH NODES (0/13). 2. Colon, segmental resection, splenic flexure - BENIGN COLON. - NO EVIDENCE OF MALIGNANCY .  Patient Name: Yona Stansbury   Date of Surgery: 01/11/2018  Pre op Diagnosis: Obstruction descending colon secondary to neoplastic mass  Post op Diagnosis: Same  Procedure: Exploratory laparotomy, left colectomy, takedown splenic flexure, end colostomy  Surgeon: Edsel Petrin. [name], M.D., FACS  Assistant: Margie Billet, PA  Indication for Assistant: Obese patient with massive intestinal distention. Assistant indicated to assist with exposure to facilitate safe procedure  Operative Indications: This is a reasonably healthy 72 year old man who has a 3 to 4-week history of gradually developing obstructive symptoms. Most recently he became distended and began vomiting. Emergency room evaluation revealed new onset atrial fibrillation which is being managed by the medical service. CT scan showed a small mass in the mid to proximal descending colon with massive colonic and small bowel dilatation. Nasogastric tube was placed. Flexible sigmoidoscopy was performed by Dr. [name] this morning and he found an apple core neoplastic mass at about 50 cm. This was tattooed. He was immediately operated upon following the colonoscopy  Operative Findings: There was an obstructing neoplastic mass in the mid to proximal descending colon. There was some chronic diverticulosis of the rectosigmoid. The tattoo was just distal to the palpable mass. There was no Naamah Boggess evidence of adenopathy or spread to the  liver. During the dissection the tumor fractured slightly and a little bit of light tan-colored stool spilled out which was immediately evacuated and washed out thoroughly. I transected the colon at least 6 or 7 inches distal to the tumor. When I transected the colon proximally it only looked like about 3 cm so I took a second specimen that was about 5 cm in length and marked a new proximal margin with a silk suture. The distal rectosigmoid segment that was stapled off was probably at least 12 inches above the peritoneal reflection. The sigmoid stump was marked with 2 Prolene sutures.  Procedure in Detail: Following the induction of general endotracheal Anesthesia Dr. [name] performed colonoscopy which will be dictated separately. After the colonoscopy the patient was placed on the operating table. His abdomen was prepped and draped in sterile fashion. Intravenous antibiotics were given. Surgical timeout was performed. Midline laparotomy incision was made. The fascia was incised in the midline. Self-retaining retractors were placed. The abdomen was explored with findings as described above. The nasogastric tube was positioned in the stomach. Because of the massive distention I milked all the small bowel content back into the stomach and it was evacuated. This helped with exposure somewhat. I mobilized the entire descending colon by dividing its lateral peritoneal attachments. Very carefully mobilized it up toward the midline. I created a window in the colon mesentery about 7 inches distal to the tumor. The colon was transected at this point with a GIA stapling device. I mobilized the descending colon and splenic flexure by taking down the lateral peritoneal attachments. Small blood vessels were controlled with the LigaSure  device. One large blood vessel was controlled with a 2-0 silk tie. Once I took the splenic flexure down I went all the way back to the transverse colon. I  transected the colon proximal to the tumor and marked the proximal margin. When I inspected the specimen it only looked like I had about 3 cm proximal margin. I then resected about another 5 cm of left transverse colon and marked a new proximal margin with a silk suture.  The spleen, pancreas, and stomach were inspected and looked good. There was no injury. I selected an area for the colostomy in the left rectus sheath and this needed to be above the umbilicus but was a good 6 inches below the costal margin. A circular button of skin was excised. The subcutaneous fat was debrided. The anterior rectus sheath was incised in a cruciate fashion. The rectus muscle was spread and the posterior rectus sheath incised with the cautery until I could pass almost 3 fingers through this. The distended transverse colon was brought up through the colostomy site and I was able to pull up about 3 or 4 inches of very pink and viable colon through the colostomy. I inspected this carefully on 2 occasions to make sure there was no twisting. The upper abdomen lower abdomen and pelvis were irrigated with saline. There was no bleeding. 3 or 4 L were used to irrigate. The NG tube was repositioned and taped in place. The midline fascia was closed with a running suture of #1 double-stranded PDS. This wound was irrigated and the skin closed loosely with staples and Telfa wicks. Honeycomb bandage was placed. The staple line on the colostomy was amputated revealing healthy pink mucosa and excellent bleeding. The colostomy was matured with numerous sutures of 3-0 Vicryl. A colostomy appliance was placed. The patient tolerated the procedure well was taken to PACU in stable condition. EBL 150 cc. Counts correct. Complications none.     Edsel Petrin. [name], M.D., FACS General and Minimally Invasive Surgery Breast and Colorectal Surgery  01/11/2018 4:23 PM   Problem List/Past Medical [name], MD;  05/08/2018 4:33 PM) CANCER OF DESCENDING COLON (C18.6) STATUS POST COLOSTOMY, FOLLOW-UP EXAM (Z09) WOUND INFECTION AFTER SURGERY (T81.49XA) ATRIAL FIBRILLATION, NEW ONSET (I48.91) ANTICOAGULATED (Z79.01) URINARY RETENTION (R33.9)  Past Surgical History [name], MD; 05/08/2018 4:33 PM) Colon Removal - Partial Resection of Small Bowel  Diagnostic Studies History [name], MD; 05/08/2018 4:33 PM) Colonoscopy within last year  Allergies [name], MD; 05/08/2018 4:33 PM) No Known Drug Allergies [01/26/2018]: Allergies Reconciled  Medication History (Armen Ferguson, CMA; 05/08/2018 4:18 PM) Tamsulosin HCl (0.4MG Capsule, Oral) Active. Ondansetron HCl (8MG Tablet, Oral) Active. Amiodarone HCl (200MG Tablet, Oral) Active. DilTIAZem HCl ER Coated Beads (240MG Capsule ER 24HR, Oral) Active. Eliquis (5MG Tablet, Oral) Active. Tylenol Extra Strength (500MG Tablet, Oral as needed) Active. Medications Reconciled  Social History [name], MD; 05/08/2018 4:33 PM) Alcohol use Occasional alcohol use. Caffeine use Tea. No drug use Tobacco use Never smoker.  Family History [name], MD; 05/08/2018 4:33 PM) Breast Cancer Mother. Colon Polyps Daughter. Melanoma Daughter.  Other Problems [name], MD; 05/08/2018 4:33 PM) Atrial Fibrillation Colon Cancer Diverticulosis    Vitals (Armen Ferguson CMA; 05/08/2018 4:18 PM) 05/08/2018 4:18 PM Weight: 220 lb Height: 70in Body Surface Area: 2.17 m Body Mass Index: 31.57 kg/m  Temp.: 98.64F  Pulse: 74 (Regular)  P.OX: 98% (Room air) BP: 118/78 (Sitting, Left Arm, Standard)  Physical Exam [name] MD; 05/08/2018 5:52 PM)  General Mental Status-Alert. General Appearance-Not in acute distress, Not Sickly. Orientation-Oriented X3. Build & Nutrition-Well nourished. Posture-Normal posture. Gait-Normal. Hydration-Well  hydrated. Voice-Normal.  Integumentary Global Assessment Upon inspection and palpation of skin surfaces of the - Axillae: non-tender, no inflammation or ulceration, no drainage. and Distribution of scalp and body hair is normal. General Characteristics Temperature - normal warmth is noted.  Head and Neck Head-normocephalic, atraumatic with no lesions or palpable masses. Face Global Assessment - atraumatic, no absence of expression. Neck Global Assessment - no abnormal movements, no bruit auscultated on the right, no bruit auscultated on the left, no decreased range of motion, non-tender. Trachea-midline. Thyroid Gland Characteristics - normal size and consistency, no palpable nodules, non-tender.  Eye Eyeball - Left-Extraocular movements intact, No Nystagmus. Eyeball - Right-Extraocular movements intact, No Nystagmus. Cornea - Left-No Hazy. Cornea - Right-No Hazy. Sclera/Conjunctiva - Left-No scleral icterus, No Discharge. Sclera/Conjunctiva - Right-No scleral icterus, No Discharge. Pupil - Left-Direct reaction to light normal. Pupil - Right-Direct reaction to light normal.  ENMT Ears Pinna - Left - no drainage observed, no generalized tenderness observed. Right - no drainage observed, no generalized tenderness observed. Nose and Sinuses External Inspection of the Nose - no destructive lesion observed. Inspection of the nares - Left - quiet respiration. Right - quiet respiration. Mouth and Throat Lips - Upper Lip - no fissures observed, no pallor noted. Lower Lip - no fissures observed, no pallor noted. Nasopharynx - no discharge present. Oral Cavity/Oropharynx - Tongue - no dryness observed. Oral Mucosa - no cyanosis observed. Hypopharynx - no evidence of airway distress observed.  Chest and Lung Exam Inspection Movements - Normal and Symmetrical. Accessory muscles - No use of accessory muscles in breathing. Palpation Palpation of the chest reveals -  Non-tender. Auscultation Breath sounds - Normal and Clear. Note: Lungs clear to auscultation bilaterally. No wheezes or rales or rhonchi. Port right infraclavicular area looks good  Cardiovascular Auscultation Rhythm - Regular. Murmurs & Other Heart Sounds - Auscultation of the heart reveals - No Murmurs and No Systolic Clicks. Note: Irregularly irregular rhythm. No murmur.   Abdomen Inspection Inspection of the abdomen reveals - No Visible peristalsis and No Abnormal pulsations. Umbilicus - No Bleeding, No Urine drainage. Palpation/Percussion Palpation and Percussion of the abdomen reveal - Soft, Non Tender, No Rebound tenderness, No Rigidity (guarding) and No Cutaneous hyperesthesia. Note: Abdomen obese but soft. Left upper quadrant colostomy in place without obvious hernia.  Midline wound 50 x 15 mm superficial and granulating. No obvious hernia. No pain or tenderness.  Male Genitourinary Sexual Maturity Tanner 5 - Adult hair pattern and Adult penile size and shape. Note: No inguinal lymphadenopathy. No inguinal hernias  Peripheral Vascular Upper Extremity Inspection - Left - No Cyanotic nailbeds, Not Ischemic. Right - No Cyanotic nailbeds, Not Ischemic.  Neurologic Neurologic evaluation reveals -normal attention span and ability to concentrate, able to name objects and repeat phrases. Appropriate fund of knowledge , normal sensation and normal coordination. Mental Status Affect - not angry, not paranoid. Cranial Nerves-Normal Bilaterally. Gait-Normal.  Neuropsychiatric Mental status exam performed with findings of-able to articulate well with normal speech/language, rate, volume and coherence, thought content normal with ability to perform basic computations and apply abstract reasoning and no evidence of hallucinations, delusions, obsessions or homicidal/suicidal ideation.  Musculoskeletal Global Assessment Spine, Ribs and Pelvis - no instability,  subluxation or laxity. Right Upper Extremity - no instability, subluxation or laxity. Normal Exam - Bilateral-Upper  Extremity Strength Normal and Lower Extremity Strength Normal.  Lymphatic Head & Neck  General Head & Neck Lymphatics: Bilateral - Description - No Localized lymphadenopathy. Axillary  General Axillary Region: Bilateral - Description - No Localized lymphadenopathy. Femoral & Inguinal  Generalized Femoral & Inguinal Lymphatics: Left - Description - No Localized lymphadenopathy. Right - Description - No Localized lymphadenopathy.    Assessment & Plan [name] MD; 05/08/2018 5:50 PM)  CANCER OF DESCENDING COLON (C18.6) Story: Cancer of left colon,adenocarcinoma, stage IIIB(pT3N1cM0), MSI-stable  Open colectomy/colostomy Hartmann resection 01/11/2017.  Chemotherapy starting 03/07/2018 Impression: Recovering status post Hartmann resection for obstructing T3 N1 colon cancer.  Continue wound care. Incision nearly closed.  Colostomy care.  Completion colonoscopy later this week by Dr. [name] Plan.  Cardiac clearance by Dr. [name]. History of atrial fibrillation stable on amiodarone, diltiazem, Eliquis anticoagulation. Make sure it safe to come off Eliquis for surgery. Patient walk several miles a day, a hopeful sign he is recovering and active. I'm assuming since he tolerated the urgent open surgery, minimally invasive takedown should be less morbid at risk for him.  Plan colostomy takedown once he is completed chemotherapy. I believe is an cycle 10 of 12. Most likely will finish within the next month. I will check this specific and time with Dr. Burr Medico Tentative plan colostomy takedown in late January/February 2020  Current Plans You are being scheduled for surgery- Our schedulers will call you.  You should hear from our office's scheduling department within 5 working days about the location, date, and time of surgery. We try to make accommodations for patient's  preferences in scheduling surgery, but sometimes the OR schedule or the surgeon's schedule prevents [place] from making those accommodations.  If you have not heard from our office [phone]) in 5 working days, call the office and ask for your surgeon's nurse.  If you have other questions about your diagnosis, plan, or surgery, call the office and ask for your surgeon's nurse.  Written instructions provided  PREOP COLON - ENCOUNTER FOR PREOPERATIVE EXAMINATION FOR GENERAL SURGICAL PROCEDURE (E56.314)  Current Plans The anatomy & physiology of the digestive tract was discussed. The pathophysiology of the colon was discussed. Natural history risks without surgery was discussed. I feel the risks of no intervention will lead to serious problems that outweigh the operative risks; therefore, I recommended a partial colectomy to remove the pathology. Minimally invasive (Robotic/Laparoscopic) & open techniques were discussed.  Risks such as bleeding, infection, abscess, leak, reoperation, possible ostomy, hernia, heart attack, death, and other risks were discussed. I noted a good likelihood this will help address the problem. Goals of post-operative recovery were discussed as well. Need for adequate nutrition, daily bowel regimen and healthy physical activity, to optimize recovery was noted as well. We will work to minimize complications. Educational materials were available as well. Questions were answered. The patient expresses understanding & wishes to proceed with surgery.  Pt Education - [facility] Colon Bowel Prep 2018 ERAS/Miralax/Antibiotics Started Neomycin Sulfate 500 MG Oral Tablet, 2 (two) Tablet SEE NOTE, #6, 05/08/2018, No Refill. Local Order: TAKE TWO TABLETS AT 2 PM, 3 PM, AND 10 PM THE DAY PRIOR TO SURGERY Started Flagyl 500 MG Oral Tablet, 2 (two) Tablet SEE NOTE, #6, 05/08/2018, No Refill. Local Order: Take at 2pm, 3pm, and 10pm the day prior to your colon operation Pt Education -  Pamphlet Given - Laparoscopic Colorectal Surgery: discussed with patient and provided information. Pt Education - [facility] Colectomy post-op instructions: discussed with patient and  provided information.  STATUS POST COLOSTOMY, FOLLOW-UP EXAM (Z09)  Current Plans Pt Education - [facility] Ostomy HCI (Imanii Gosdin): discussed with patient and provided information.  ATRIAL FIBRILLATION, NEW ONSET (I48.91) Impression: Has not converted yet. On Eliquis   ANTICOAGULATED (Z79.01)  [name], MD, FACS, MASCRS Gastrointestinal and Minimally Invasive Surgery    [address] [place], [place] [address] [phone] Main / Paging [phone] Fax

## 2018-05-09 ENCOUNTER — Telehealth: Payer: Self-pay | Admitting: *Deleted

## 2018-05-09 NOTE — Telephone Encounter (Signed)
Received call from [facility] needing insurance information.  Faxed demographic/insurance info to [phone].

## 2018-05-09 NOTE — Telephone Encounter (Signed)
Oral Chemotherapy Pharmacist Encounter   I spoke with patient for overview of: Xeloda (capecitabine) for the continued adjuvant treatment of stage IIIB colon cancer, planned duration to complete 6 months of adjuvant treatment.  Patient originally started on adjuvant FOLFOX administered every 2 weeks, with plans for 6 months of treatment (12 planned cycles)  Patient noted to be tolerating treatment very poorly and requests to stop chemotherapy. He has completed 4 cycles of FOLFOX (2 months).  After discussion with MD, patient agreeable to discontinue use of oxaliplatin and complete planned adjuvant course with Xeloda monotherapy.   Counseled patient on administration, dosing, side effects, monitoring, drug-food interactions, safe handling, storage, and disposal.  Patient will take Xeloda 500mg  tablets, 5 tablets (2500mg ) by mouth in AM and 4 tabs (2000mg ) by mouth in PM, within 30 minutes of finishing meals, on days 1-14 of each 21 day cycle.   Xeloda start date: 05/15/2018  Adverse effects include but are not limited to: fatigue, decreased blood counts, GI upset, diarrhea, mouth sores, and hand-foot syndrome.  Patient has anti-emetic on hand and knows to take it if nausea develops.   Patient will obtain anti diarrheal and alert the office of 4 or more loose stools above baseline.  He states he is already managing intermittent constipation and intermittent diarrhea. Patient states he was still extremely fatigued after last dose of IV chemotherapy despite discontinuation of oxaliplatin. Patient states he has not regained any feeling in his hands or feet as of yet.  Patient was surprised to hear about the number of tablets he would be taking each day. He states the doctor had told him he would only be taking 1 pill in the morning and 1 pill in the evening for 2 weeks. We discussed the dose basis of Xeloda and that prescribed dose was appropriate.  Patient states he is likely not willing  to continue Xeloda to complete 6 months of treatment. Patient instructed to further discuss risks and benefits of treatment length with Dr. Burr Medico.  Reviewed with patient importance of keeping a medication schedule and plan for any missed doses.  Mr. Marinello voiced understanding and appreciation.   All questions answered. Medication reconciliation performed and medication/allergy list updated.  Patient informed that [facility] requirement. Patient states he has not yet heard from [facility] to schedule his first shipment of Xeloda or to deliver copayment information.  Patient knows to call the office with questions or concerns. [facility]cology Clinic will continue to follow.  Johny Drilling, PharmD, BCPS, BCOP  05/09/2018   2:42 PM [facility] [phone]

## 2018-05-10 ENCOUNTER — Telehealth: Payer: Self-pay

## 2018-05-10 ENCOUNTER — Ambulatory Visit (AMBULATORY_SURGERY_CENTER): Payer: 59 | Admitting: Gastroenterology

## 2018-05-10 ENCOUNTER — Encounter: Payer: Self-pay | Admitting: Gastroenterology

## 2018-05-10 ENCOUNTER — Encounter

## 2018-05-10 VITALS — BP 123/70 | HR 85 | Temp 98.4°F | Resp 17 | Ht 71.0 in | Wt 220.0 lb

## 2018-05-10 DIAGNOSIS — Z85038 Personal history of other malignant neoplasm of large intestine: Secondary | ICD-10-CM | POA: Diagnosis not present

## 2018-05-10 DIAGNOSIS — K5730 Diverticulosis of large intestine without perforation or abscess without bleeding: Secondary | ICD-10-CM | POA: Diagnosis not present

## 2018-05-10 DIAGNOSIS — C186 Malignant neoplasm of descending colon: Secondary | ICD-10-CM

## 2018-05-10 DIAGNOSIS — K598 Other specified functional intestinal disorders: Secondary | ICD-10-CM | POA: Diagnosis not present

## 2018-05-10 DIAGNOSIS — K639 Disease of intestine, unspecified: Secondary | ICD-10-CM

## 2018-05-10 HISTORY — PX: COLONOSCOPY: SHX174

## 2018-05-10 HISTORY — PX: OTHER SURGICAL HISTORY: SHX169

## 2018-05-10 MED ORDER — SODIUM CHLORIDE 0.9 % IV SOLN: 500.0000 mL | Freq: Once | INTRAVENOUS | Status: DC

## 2018-05-10 NOTE — Telephone Encounter (Signed)
Oral Oncology Pharmacist Encounter  Received notification from MD that patient does not want to change therapy to Xeloda. He will be scheduled for 5-FU infusional pump.  Xeloda has been removed from patient's medication list.  No further needs from [facility] identified at this time. [facility] Oncology Clinic will sign off. Please let us know if we can be of assistance in the future.  Johny Drilling, PharmD, BCPS, BCOP  05/10/2018 12:21 PM [facility] [phone]

## 2018-05-10 NOTE — Progress Notes (Signed)
Report to PACU, RN, vss, BBS= Clear.  

## 2018-05-10 NOTE — Telephone Encounter (Signed)
Patient's wife called concerning Xeloda. He is not up to taking an additional 9 pills a day.  She is wanting to know can he get an infusion with the pump instead.  Her [phone]

## 2018-05-10 NOTE — Patient Instructions (Signed)
Handouts Provided: Diverticulosis and  High Fiber Diet  RESUME Eliquis at prior dose Tomorrow  YOU HAD AN ENDOSCOPIC PROCEDURE TODAY AT [place]:   Refer to the procedure report that was given to you for any specific questions about what was found during the examination.  If the procedure report does not answer your questions, please call your gastroenterologist to clarify.  If you requested that your care partner not be given the details of your procedure findings, then the procedure report has been included in a sealed envelope for you to review at your convenience later.  YOU SHOULD EXPECT: Some feelings of bloating in the abdomen. Passage of more gas than usual.  Walking can help get rid of the air that was put into your GI tract during the procedure and reduce the bloating. If you had a lower endoscopy (such as a colonoscopy or flexible sigmoidoscopy) you may notice spotting of blood in your stool or on the toilet paper. If you underwent a bowel prep for your procedure, you may not have a normal bowel movement for a few days.  Please Note:  You might notice some irritation and congestion in your nose or some drainage.  This is from the oxygen used during your procedure.  There is no need for concern and it should clear up in a day or so.  SYMPTOMS TO REPORT IMMEDIATELY:   Following lower endoscopy (colonoscopy or flexible sigmoidoscopy):  Excessive amounts of blood in the stool  Significant tenderness or worsening of abdominal pains  Swelling of the abdomen that is new, acute  Fever of 100F or higher  For urgent or emergent issues, a gastroenterologist can be reached at any hour by calling [phone].   DIET:  We do recommend a small meal at first, but then you may proceed to your regular diet.  Drink plenty of fluids but you should avoid alcoholic beverages for 24 hours.  ACTIVITY:  You should plan to take it easy for the rest of today and you should NOT DRIVE  or use heavy machinery until tomorrow (because of the sedation medicines used during the test).    FOLLOW UP: Our staff will call the number listed on your records the next business day following your procedure to check on you and address any questions or concerns that you may have regarding the information given to you following your procedure. If we do not reach you, we will leave a message.  However, if you are feeling well and you are not experiencing any problems, there is no need to return our call.  We will assume that you have returned to your regular daily activities without incident.  If any biopsies were taken you will be contacted by phone or by letter within the next 1-3 weeks.  Please call us at [phone] if you have not heard about the biopsies in 3 weeks.    SIGNATURES/CONFIDENTIALITY: You and/or your care partner have signed paperwork which will be entered into your electronic medical record.  These signatures attest to the fact that that the information above on your After Visit Summary has been reviewed and is understood.  Full responsibility of the confidentiality of this discharge information lies with you and/or your care-partner.

## 2018-05-10 NOTE — Progress Notes (Signed)
Called to room to assist during endoscopic procedure.  Patient ID and intended procedure confirmed with present staff. Received instructions for my participation in the procedure from the performing physician.  

## 2018-05-10 NOTE — Telephone Encounter (Signed)
I called her back and will change back to 5-fu, infusion schedule request sent. She knows not to fill Xeloda.   [name] MD

## 2018-05-10 NOTE — Telephone Encounter (Signed)
Oral Oncology Pharmacist Encounter  I called [facility] to follow-up on status of patient Xeloda prescription.  Pharmacy is still awaiting fax from the office with prescription insurance information so that they may processed the patient's prescription. Representative stated it could take 24 to 48 hours for faxes to come through.  I provided prescription insurance information to representative over the phone. She will send that information to her benefits team immediately and marked the prescription as urgent. I provided direct dial to oral oncology clinic in case dispensing pharmacy has any other needs and processing this patient's prescription.  I will follow-up with [facility] by the end of the day today just to follow-up on status.  Johny Drilling, PharmD, BCPS, BCOP  05/10/2018 9:19 AM Oral Oncology Clinic [phone]

## 2018-05-11 ENCOUNTER — Telehealth: Payer: Self-pay

## 2018-05-11 NOTE — Op Note (Signed)
Dave Vaughn Patient Name: Dave Vaughn Procedure Date: 05/10/2018 4:08 PM MRN: [id number] Endoscopist: [name] , MD Age: 72 Referring MD:  Date of Birth: [birth year] Gender: Male Account #: [id number] Procedure:                Colonoscopy Indications:              High risk colon cancer surveillance: Personal                            history of colon cancer Medicines:                Monitored Anesthesia Care Procedure:                Pre-Anesthesia Assessment:                           - Prior to the procedure, a History and Physical                            was performed, and patient medications and                            allergies were reviewed. The patient's tolerance of                            previous anesthesia was also reviewed. The risks                            and benefits of the procedure and the sedation                            options and risks were discussed with the patient.                            All questions were answered, and informed consent                            was obtained. Prior Anticoagulants: The patient has                            taken Eliquis (apixaban), last dose was 2 days                            prior to procedure. ASA Grade Assessment: III - A                            patient with severe systemic disease. After                            reviewing the risks and benefits, the patient was                            deemed in satisfactory condition to undergo the  procedure.                           After obtaining informed consent, the colonoscope                            was passed under direct vision. Throughout the                            procedure, the patient's blood pressure, pulse, and                            oxygen saturations were monitored continuously. The                            Model PCF-[id number]) scope was introduced     through anus and advanced into the descending colon                            and introduced through the transverse colostomy and                            advanced to the the cecum, identified by                            appendiceal orifice and ileocecal valve. The                            colonoscopy was performed without difficulty. The                            patient tolerated the procedure well. The quality                            of the bowel preparation was adequate after                            extensive lavage and suction. Right colon prep was                            better than the left colon prep. Scope In: 4:18:49 PM Scope Out: 4:33:10 PM Scope Withdrawal Time: 0 hours 4 minutes 53 seconds  Total Procedure Duration: 0 hours 14 minutes 21 seconds  Findings:                 The perianal and digital rectal examinations were                            normal.                           There was evidence of a prior surgery in the                            descending colon. This was characterized by healthy  appearing mucosa.                           Multiple medium-mouthed diverticula were found in                            the left colon. There was no evidence of                            diverticular bleeding.                           A diffuse area of moderately erythematous mucosa                            was found in the rectum and in the sigmoid colon.                            R/O diversion colitis. Biopsies were taken with a                            cold forceps for histology.                           There was evidence of a widely patent end colostomy                            in the transverse colon. This was characterized by                            healthy appearing mucosa.                           The transverse colon, hepatic flexure, ascending                            colon and cecum appeared normal.                            The exam was otherwise normal throughout the                            examined colon. Complications:            No immediate complications. Estimated Blood Loss:     Estimated blood loss was minimal. Impression:               - Moderate diverticulosis in the left colon. There                            was no evidence of diverticular bleeding.                           - Evidence of prior surgery in the desceding colon.                           -  Erythematous mucosa in the rectum and in the                            sigmoid colon. Biopsied.                           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                           - The transverse colon, hepatic flexure, ascending                            colon and cecum appeared normal. Recommendation:           - Patient has a contact number available for                            emergencies. The signs and symptoms of potential                            delayed complications were discussed with the                            patient. Return to normal activities tomorrow.                            Written discharge instructions were provided to the                            patient.                           - High fiber diet.                           - Continue present medications.                           - Await pathology results.                           - Resume Eliquis (apixaban) at prior dose tomorrow.                            Refer to managing physician for further adjustment                            of therapy.                           - Repeat colonoscopy in 1 year for surveillance                            with a  more extensive bowel prep. [name], MD 05/10/2018 4:45:54 PM This report has been signed electronically.

## 2018-05-11 NOTE — Telephone Encounter (Signed)
  Follow up Call-  Call back number 05/10/2018  Post procedure Call Back phone  # [phone]  Permission to leave phone message Yes  Some recent data might be hidden     Patient questions:  Do you have a fever, pain , or abdominal swelling? No. Pain Score  0 *  Have you tolerated food without any problems? Yes.    Have you been able to return to your normal activities? Yes.    Do you have any questions about your discharge instructions: Diet   No. Medications  No. Follow up visit  No.  Do you have questions or concerns about your Care? No.  Actions: * If pain score is 4 or above: No action needed, pain <4.

## 2018-05-12 ENCOUNTER — Telehealth: Payer: Self-pay | Admitting: Hematology

## 2018-05-12 NOTE — Progress Notes (Signed)
[place]   [phone]) [phone] Fax:(336) [phone]   Clinic Follow up Note   Patient Care Team: [name], MD as PCP - General (Family Medicine) [name], MD as Consulting Physician (Cardiology) [name], MD as Consulting Physician (Gastroenterology) [name], MD as Consulting Physician (General Surgery) [name], MD as Consulting Physician (General Surgery) [name], MD as Consulting Physician (Urology) [name], MD as Consulting Physician (Medical Oncology) 05/15/2018  CHIEF COMPLAINT: F/u on colon cancer   SUMMARY OF ONCOLOGIC HISTORY: Oncology History   Cancer Staging Cancer of left colon [facility]) Staging form: Colon and Rectum, AJCC 8th Edition - Pathologic stage from 01/11/2018: Stage IIIB (pT3, pN1c, cM0) - Signed by [name], MD on 01/16/2018       [name]   01/10/2018 Imaging    CT AP W Contrast 01/10/18  IMPRESSION: Irregular soft tissue density causing stricture of the mid descending colon likely the site of obstruction for the dilated small bowel. 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 No evidence of perforation.  Equivocal findings involving the appendix measuring 1.2 cm at the appendiceal tip with mucosal enhancement. No adjacent free fluid or inflammatory change. Findings are nonspecific, but can be seen with early acute appendicitis.  Mild prostatic enlargement. Increased density over the posterior bladder base likely due to the large prostatic impression although cannot completely exclude a bladder mass. Urology protocol CT or ultrasound may be helpful for better evaluation.  Mild cholelithiasis.  Stable 1.5 cm cystic structure over the lower pole right kidney likely slightly hyperdense cyst.  Diverticulosis of the colon.  Aortic Atherosclerosis (ICD10-I70.0).      01/11/2018 Cancer Staging    Staging form: Colon and Rectum, AJCC 8th Edition - Pathologic  stage from 01/11/2018: Stage IIIB (pT3, pN1c, cM0) - Signed by [name], MD on 01/16/2018    01/11/2018 Surgery    LEFT COLON RESECTION, TAKEDOWN SPLENIC FLEXURE, COLOSTOMY by Dr. [name]     01/11/2018 Procedure    Colonoscopy 01/11/18 by Dr. [name]  - Malignant completely obstructing tumor in the mid descending colon. Tattooed. - Diverticulosis in the sigmoid colon. - Non-bleeding internal hemorrhoids. - No specimens collected.    01/11/2018 Pathology Results    Diagnosis 01/11/18  1. Colon, segmental resection for tumor, descending colon - INVASIVE COLORECTAL ADENOCARCINOMA, 4 CM. - TUMOR EXTENDS INTO PERICOLONIC CONNECTIVE TISSUE. - TUMOR FOCALLY INVOLVES RADIAL MARGIN. - ONE MESENTERIC TUMOR DEPOSIT. - THIRTEEN BENIGN LYMPH NODES (0/13). 2. Colon, segmental resection, splenic flexure - BENIGN COLON. - NO EVIDENCE OF MALIGNANCY .    01/11/2018 Tumor Marker    Baseline CEA at 3.4    01/16/2018 Initial Diagnosis    [name]    01/23/2018 Imaging    CT CHEST WO CONTRAST IMPRESSION: 1. No evidence for metastatic disease within the chest. 2. Small left pleural effusion with underlying opacities which may represent atelectasis. Right basilar atelectasis. 3. Few foci of gas within the upper abdomen in the omentum with surrounding fat stranding, likely postsurgical 4. Aortic Atherosclerosis (ICD10-I70.0).    03/07/2018 - 05/15/2018 Chemotherapy    The patient had palonosetron (ALOXI) leucovorin 856 mg in dextrose 5 % 250 mL infusion, oxaliplatin (ELOXATIN)   Oxali stopped  fluorouracil (ADRUCIL)     03/19/2018 Imaging    03/19/2018 CT AP IMPRESSION: 1. Interval partial left hemicolectomy and descending colostomy. 2. Heterogeneous soft tissue density along the left anterior renal fascia is most likely postoperative (favor  fat necrosis). No well-defined fluid collection. 3. Mild left lower quadrant edema, new since 01/10/2018. This could be postoperative. Superimposed  sigmoid diverticulitis and/or cystitis cannot be excluded. 4. Subtle hyperenhancing nodule within the anterior bladder dome cannot be excluded. Consider nonemergent cystoscopy. When this is performed, recommend attention to the left ureterovesicular junction and distal left ureter to evaluate questionable soft tissue fullness. 5. Cholelithiasis. 6.  Aortic Atherosclerosis (ICD10-I70.0). 7. Prostatomegaly.     CURRENT THERAPY Surveillance   INTERVAL HISTORY: Dave Vaughn is a 72 y.o. male who is here for follow-up. I recently called to inform the patient the 5-FU will be resumed and Xeloda will not be given. Today, he is here with his wife. He plans to have his last cycle today. He tolerated last cycle with metal taste and tremors. The tremors make it hard to write or use the computer, and has them only during activity. No tremors at rest. He drops small objects. His appetite is good, but still experiences fatigue occasionally. He is able to stay active and carry out daily activities nicely. He feels tingling in his fingers after touching cold objects. He feels that his balance has improved.    Pertinent positives and negatives of review of systems are listed and detailed within the above HPI.  REVIEW OF SYSTEMS:   Constitutional: Denies fevers, chills or abnormal weight loss (+) occasional fatigue  Eyes: Denies blurriness of vision Ears, nose, mouth, throat, and face: Denies mucositis or sore throat (+) metal taste Respiratory: Denies cough, dyspnea or wheezes Cardiovascular: Denies palpitation, chest discomfort or lower extremity swelling Gastrointestinal:  Denies nausea, heartburn or change in bowel habits Skin: Denies abnormal skin rashes Lymphatics: Denies new lymphadenopathy or easy bruising Neurological:(+) hand tremor with movement Behavioral/Psych: Mood is stable, no new changes  All other systems were reviewed with the patient and are negative.  MEDICAL HISTORY:    Past Medical History:  Diagnosis Date  . Adenocarcinoma, colon ([place])   . Anemia    taking iron supplements  . Atrial fibrillation with RVR ([place])   . Diverticulitis   . Dysrhythmia    afib  . History of kidney stones   . Kidney stones     SURGICAL HISTORY: Past Surgical History:  Procedure Laterality Date  . ANKLE SURGERY Left    when he was in college  . COLON RESECTION N/A 01/11/2018   Procedure: LEFT COLON RESECTION, TAKEDOWN SPLENIC FLEXURE, COLOSTOMY;  Surgeon: [name], MD;  Location: [facility];  Service: General;  Laterality: N/A;  . COLONOSCOPY  01/11/2018   Procedure: COLONOSCOPY;  Surgeon: Jackquline Denmark, MD;  Location: [facility];  Service: Endoscopy;;  . PORTACATH PLACEMENT Right 03/07/2018   Procedure: INSERTION PORT-A-CATH RIGHT SUBCLAVIAN;  Surgeon: [name], MD;  Location: [place];  Service: General;  Laterality: Right;  . thumb surgery   2018   cyst removal    I have reviewed the social history and family history with the patient and they are unchanged from previous note.  ALLERGIES:  has No Known Allergies.  MEDICATIONS:  Current Outpatient Medications  Medication Sig Dispense Refill  . acetaminophen (TYLENOL) 500 MG tablet Take 1,000 mg by mouth every 6 (six) hours as needed (for pain.).    [name] ALPRAZolam (XANAX) 0.25 MG tablet Take 1 tablet (0.25 mg total) by mouth at bedtime as needed for anxiety. 20 tablet 0  . amiodarone (PACERONE) 200 MG tablet Take 1 tablet (200 mg total) by mouth 2 (two) times daily. 60 tablet  0  . apixaban (ELIQUIS) 5 MG TABS tablet Take 1 tablet (5 mg total) by mouth 2 (two) times daily. 60 tablet 0  . diltiazem (CARTIA XT) 240 MG 24 hr capsule Take 1 capsule (240 mg total) by mouth daily. 30 capsule 11  . docusate sodium (COLACE) 100 MG capsule Take 100 mg by mouth 2 (two) times daily.    [name] lidocaine-prilocaine (EMLA) cream Apply to affected area once 30 g 3  . Multiple Vitamins-Iron (MULTIVITAMIN/IRON PO) Take 1 tablet by mouth 2  (two) times daily.    . ondansetron (ZOFRAN) 8 MG tablet Take 1 tablet (8 mg total) by mouth 2 (two) times daily as needed for refractory nausea / vomiting. Start on day 3 after chemotherapy. 30 tablet 1  . prochlorperazine (COMPAZINE) 10 MG tablet Take 1 tablet (10 mg total) by mouth every 6 (six) hours as needed (Nausea or vomiting). 30 tablet 1  . promethazine (PHENERGAN) 25 MG tablet Take 1 tablet (25 mg total) by mouth every 8 (eight) hours as needed for nausea or vomiting. 30 tablet 1  . tamsulosin (FLOMAX) 0.4 MG CAPS capsule Take 0.4 mg by mouth.     No current facility-administered medications for this visit.     PHYSICAL EXAMINATION: ECOG PERFORMANCE STATUS: 1 - Symptomatic but completely ambulatory  Vitals:   05/15/18 1136  BP: (!) 141/81  Pulse: 85  Resp: 18  Temp: 98.5 F (36.9 C)  SpO2: 97%   Filed Weights   05/15/18 1136  Weight: 224 lb 11.2 oz (101.9 kg)    GENERAL:alert, no distress and comfortable SKIN: skin color, texture, turgor are normal, no rashes or significant lesions EYES: normal, Conjunctiva are pink and non-injected, sclera clear OROPHARYNX:no exudate, no erythema and lips, buccal mucosa, and tongue normal  NECK: supple, thyroid normal size, non-tender, without nodularity LYMPH:  no palpable lymphadenopathy in the cervical, axillary or inguinal LUNGS: clear to auscultation and percussion with normal breathing effort HEART: regular rate & rhythm and no murmurs and no lower extremity edema ABDOMEN:abdomen soft, non-tender and normal bowel sounds Musculoskeletal:no cyanosis of digits and no clubbing  NEURO: alert & oriented x 3 with fluent speech, no focal motor/sensory deficits  LABORATORY DATA:  I have reviewed the data as listed CBC Latest Ref Rng & Units 05/15/2018 05/01/2018 04/18/2018  WBC 4.0 - 10.5 K/uL 7.8 6.5 6.5  Hemoglobin 13.0 - 17.0 g/dL 12.6(L) 11.8(L) 11.0(L)  Hematocrit 39.0 - 52.0 % 37.7(L) 36.7(L) 34.5(L)  Platelets 150 - 400 K/uL  196 195 169     CMP Latest Ref Rng & Units 05/15/2018 05/01/2018 04/18/2018  Glucose 70 - 99 mg/dL 109(H) 97 99  BUN 8 - 23 mg/dL 18 22 23   Creatinine 0.61 - 1.24 mg/dL 1.59(H) 1.51(H) 1.40(H)  Sodium 135 - 145 mmol/L 142 142 141  Potassium 3.5 - 5.1 mmol/L 4.2 4.2 4.1  Chloride 98 - 111 mmol/L 106 106 106  CO2 22 - 32 mmol/L 27 26 26   Calcium 8.9 - 10.3 mg/dL 9.2 9.2 9.1  Total Protein 6.5 - 8.1 g/dL 7.0 6.8 6.6  Total Bilirubin 0.3 - 1.2 mg/dL 0.3 0.4 0.5  Alkaline Phos 38 - 126 U/L 119 103 110  AST 15 - 41 U/L 24 19 21   ALT 0 - 44 U/L 30 17 23       RADIOGRAPHIC STUDIES: I have personally reviewed the radiological images as listed and agreed with the findings in the report. No results found.   ASSESSMENT & PLAN:  Dave Vaughn is a 72 y.o. male with history of  1. Cancer of left colon,adenocarcinoma, stage IIIB(pT3N1cM0), MSI-stable -Diagnosed in 01/2018. Treated with surgery and adjuvant chemo.  -He started adjuvant FOLFOX in 03/2018.  Unfortunately he developed significant peripheral neuropathy after 3 cycles of chemotherapy, and oxaliplatin was stopped. Currently on 5-FU. Tolerating with fatigue, tremors with writing, and metal taste. -Labs reviewed, CBC showed Hg 12.6 HCT 37.7 PLT 196K.  -Patient is very concerned about long-term side effect form chemo, and would like to stop chemo after today's cycle 6.  I explained to him that most of his neuropathy is from previous oxaliplatin, which we have stopped.  I do not think his neuropathy will get much worse with 5-FU.  I encouraged him to continue 5-FU pump infusion every 2 weeks, to complete 6 months adjuvant therapy.  After lengthy discussion, he declined more chemo.  -I advised him to keep his port for a year due to risk of cancer recurrence. He agrees. We will keep it and flush every 6 weeks.  --I discussed the risk of cancer recurrence in the future. I discussed the surveillance plan, which is a physical exam and lab test  (including CBC, CMP and CEA) every 3 months for the first 2 years, then every 6-12 months, colonoscopy in one year, and surveilliance CT scan every 6-12 month for up to 5 year.  -He had a CT abdomen and pelvis with contrast on March 19, 2018, during his ED visit for hematuria, which showed no evidence of recurrence. -f/u in 3 months, plan to repeat surveillance CT scan in 6 months.   2. Atrial Fibrillation -continue Eliquis. Rate controlled.  3. Anemia, secondary to chemo -Labs reviewed, Hg 12.6. Improved   4. CKD stage III -he has developed mild increased Cr since he started chemo, EGFR around 40-50's  -Cr 1.59 today, I encouraged him to drink more fluids   5. Abdominal wound  -Due to previous surgery. Healing slowly. Much improved lately   6.  Peripheral neuropathy due to chemotherapy, grade 2 -Currently on vitamin B complex. -Will consider gabapentin if worse  -We discussed PT if he doesn't improve.   Plan  -Labs reviewed, good to proceed with chemo today. This will be his last dose adjuvant chemo.  -labs and port flush in 6 weeks  -f/u in 3 months  No problem-specific Assessment & Plan notes found for this encounter.   No orders of the defined types were placed in this encounter.  All questions were answered. The patient knows to call the clinic with any problems, questions or concerns. No barriers to learning was detected. I spent 20 minutes counseling the patient face to face. The total time spent in the appointment was 25 minutes and more than 50% was on counseling and review of test results  I, Noor Dweik am acting as scribe for Dr. [name].  I have reviewed the above documentation for accuracy and completeness, and I agree with the above.     [name], MD 05/15/2018

## 2018-05-12 NOTE — Telephone Encounter (Signed)
Scheduled appt per 12/6 sch message - pt is aware of appt date and time   

## 2018-05-15 ENCOUNTER — Telehealth: Payer: Self-pay

## 2018-05-15 ENCOUNTER — Inpatient Hospital Stay: Payer: 59 | Attending: Nurse Practitioner

## 2018-05-15 ENCOUNTER — Inpatient Hospital Stay (HOSPITAL_BASED_OUTPATIENT_CLINIC_OR_DEPARTMENT_OTHER): Payer: 59 | Admitting: Hematology

## 2018-05-15 ENCOUNTER — Inpatient Hospital Stay: Payer: 59

## 2018-05-15 ENCOUNTER — Other Ambulatory Visit: Payer: 59

## 2018-05-15 ENCOUNTER — Ambulatory Visit: Payer: 59

## 2018-05-15 ENCOUNTER — Encounter: Payer: Self-pay | Admitting: Hematology

## 2018-05-15 ENCOUNTER — Ambulatory Visit: Payer: 59 | Admitting: Hematology

## 2018-05-15 VITALS — BP 141/81 | HR 85 | Temp 98.5°F | Resp 18 | Ht 71.0 in | Wt 224.7 lb

## 2018-05-15 DIAGNOSIS — Z95828 Presence of other vascular implants and grafts: Secondary | ICD-10-CM

## 2018-05-15 DIAGNOSIS — I4891 Unspecified atrial fibrillation: Secondary | ICD-10-CM | POA: Diagnosis not present

## 2018-05-15 DIAGNOSIS — D6481 Anemia due to antineoplastic chemotherapy: Secondary | ICD-10-CM

## 2018-05-15 DIAGNOSIS — Z5111 Encounter for antineoplastic chemotherapy: Secondary | ICD-10-CM | POA: Diagnosis not present

## 2018-05-15 DIAGNOSIS — Z7901 Long term (current) use of anticoagulants: Secondary | ICD-10-CM | POA: Insufficient documentation

## 2018-05-15 DIAGNOSIS — C186 Malignant neoplasm of descending colon: Secondary | ICD-10-CM

## 2018-05-15 DIAGNOSIS — T451X5S Adverse effect of antineoplastic and immunosuppressive drugs, sequela: Secondary | ICD-10-CM | POA: Insufficient documentation

## 2018-05-15 DIAGNOSIS — G620 Drug-induced polyneuropathy: Secondary | ICD-10-CM | POA: Insufficient documentation

## 2018-05-15 DIAGNOSIS — R251 Tremor, unspecified: Secondary | ICD-10-CM | POA: Insufficient documentation

## 2018-05-15 DIAGNOSIS — R5383 Other fatigue: Secondary | ICD-10-CM | POA: Diagnosis not present

## 2018-05-15 DIAGNOSIS — N183 Chronic kidney disease, stage 3 (moderate): Secondary | ICD-10-CM | POA: Insufficient documentation

## 2018-05-15 DIAGNOSIS — Z933 Colostomy status: Secondary | ICD-10-CM | POA: Insufficient documentation

## 2018-05-15 DIAGNOSIS — D490 Neoplasm of unspecified behavior of digestive system: Secondary | ICD-10-CM

## 2018-05-15 LAB — IRON AND TIBC
Iron: 41 ug/dL — ABNORMAL LOW (ref 42–163)
SATURATION RATIOS: 13 % — AB (ref 20–55)
TIBC: 319 ug/dL (ref 202–409)
UIBC: 277 ug/dL (ref 117–376)

## 2018-05-15 LAB — CBC WITH DIFFERENTIAL (CANCER CENTER ONLY)
Abs Immature Granulocytes: 0.03 10*3/uL (ref 0.00–0.07)
Basophils Absolute: 0.1 10*3/uL (ref 0.0–0.1)
Basophils Relative: 1 %
EOS PCT: 2 %
Eosinophils Absolute: 0.2 10*3/uL (ref 0.0–0.5)
HCT: 37.7 % — ABNORMAL LOW (ref 39.0–52.0)
Hemoglobin: 12.6 g/dL — ABNORMAL LOW (ref 13.0–17.0)
Immature Granulocytes: 0 %
Lymphocytes Relative: 27 %
Lymphs Abs: 2.1 10*3/uL (ref 0.7–4.0)
MCH: 31.3 pg (ref 26.0–34.0)
MCHC: 33.4 g/dL (ref 30.0–36.0)
MCV: 93.5 fL (ref 80.0–100.0)
Monocytes Absolute: 0.6 10*3/uL (ref 0.1–1.0)
Monocytes Relative: 7 %
Neutro Abs: 4.9 10*3/uL (ref 1.7–7.7)
Neutrophils Relative %: 63 %
Platelet Count: 196 10*3/uL (ref 150–400)
RBC: 4.03 MIL/uL — ABNORMAL LOW (ref 4.22–5.81)
RDW: 17.2 % — ABNORMAL HIGH (ref 11.5–15.5)
WBC Count: 7.8 10*3/uL (ref 4.0–10.5)
nRBC: 0 % (ref 0.0–0.2)

## 2018-05-15 LAB — CMP (CANCER CENTER ONLY)
ALT: 30 U/L (ref 0–44)
ANION GAP: 9 (ref 5–15)
AST: 24 U/L (ref 15–41)
Albumin: 3.7 g/dL (ref 3.5–5.0)
Alkaline Phosphatase: 119 U/L (ref 38–126)
BUN: 18 mg/dL (ref 8–23)
CO2: 27 mmol/L (ref 22–32)
CREATININE: 1.59 mg/dL — AB (ref 0.61–1.24)
Calcium: 9.2 mg/dL (ref 8.9–10.3)
Chloride: 106 mmol/L (ref 98–111)
GFR, Est AFR Am: 50 mL/min — ABNORMAL LOW (ref >60)
GFR, Estimated: 43 mL/min — ABNORMAL LOW (ref >60)
Glucose, Bld: 109 mg/dL — ABNORMAL HIGH (ref 70–99)
Potassium: 4.2 mmol/L (ref 3.5–5.1)
Sodium: 142 mmol/L (ref 135–145)
TOTAL PROTEIN: 7 g/dL (ref 6.5–8.1)
Total Bilirubin: 0.3 mg/dL (ref 0.3–1.2)

## 2018-05-15 LAB — FERRITIN: Ferritin: 115 ng/mL (ref 24–336)

## 2018-05-15 MED ORDER — SODIUM CHLORIDE 0.9 % IV SOLN
2400.0000 mg/m2 | INTRAVENOUS | Status: DC
  Administered 2018-05-15: 5150 mg via INTRAVENOUS
  Filled 2018-05-15: qty 103

## 2018-05-15 MED ORDER — SODIUM CHLORIDE 0.9 % IV SOLN
Freq: Once | INTRAVENOUS | Status: AC
  Administered 2018-05-15: 12:00:00 via INTRAVENOUS
  Filled 2018-05-15: qty 250

## 2018-05-15 MED ORDER — LEUCOVORIN CALCIUM INJECTION 350 MG
400.0000 mg/m2 | Freq: Once | INTRAVENOUS | Status: AC
  Administered 2018-05-15: 856 mg via INTRAVENOUS
  Filled 2018-05-15: qty 42.8

## 2018-05-15 MED ORDER — PROCHLORPERAZINE MALEATE 10 MG PO TABS
10.0000 mg | ORAL_TABLET | Freq: Four times a day (QID) | ORAL | Status: DC | PRN
  Administered 2018-05-15: 10 mg via ORAL

## 2018-05-15 MED ORDER — PROCHLORPERAZINE MALEATE 10 MG PO TABS
ORAL_TABLET | ORAL | Status: AC
  Filled 2018-05-15: qty 1

## 2018-05-15 MED ORDER — DEXAMETHASONE SODIUM PHOSPHATE 10 MG/ML IJ SOLN
10.0000 mg | Freq: Once | INTRAMUSCULAR | Status: AC
  Administered 2018-05-15: 10 mg via INTRAVENOUS

## 2018-05-15 MED ORDER — SODIUM CHLORIDE 0.9% FLUSH
10.0000 mL | INTRAVENOUS | Status: DC | PRN
  Filled 2018-05-15: qty 10

## 2018-05-15 MED ORDER — DEXAMETHASONE SODIUM PHOSPHATE 10 MG/ML IJ SOLN
INTRAMUSCULAR | Status: AC
  Filled 2018-05-15: qty 1

## 2018-05-15 MED ORDER — SODIUM CHLORIDE 0.9% FLUSH
10.0000 mL | INTRAVENOUS | Status: DC | PRN
  Administered 2018-05-15: 10 mL
  Filled 2018-05-15: qty 10

## 2018-05-15 MED ORDER — FLUOROURACIL CHEMO INJECTION 2.5 GM/50ML
400.0000 mg/m2 | Freq: Once | INTRAVENOUS | Status: AC
  Administered 2018-05-15: 850 mg via INTRAVENOUS
  Filled 2018-05-15: qty 17

## 2018-05-15 NOTE — Progress Notes (Signed)
Per Cira Rue, NP, ok to treat with Scr 1.59.

## 2018-05-15 NOTE — Patient Instructions (Signed)
[place] Discharge Instructions for Patients Receiving Chemotherapy  Today you received the following chemotherapy agents  Leucovorin & Fluorouracil (Adrucil).   To help prevent nausea and vomiting after your treatment, we encourage you to take your nausea medication as prescribed.   If you develop nausea and vomiting that is not controlled by your nausea medication, call the clinic.   BELOW ARE SYMPTOMS THAT SHOULD BE REPORTED IMMEDIATELY:  *FEVER GREATER THAN 100.5 F  *CHILLS WITH OR WITHOUT FEVER  NAUSEA AND VOMITING THAT IS NOT CONTROLLED WITH YOUR NAUSEA MEDICATION  *UNUSUAL SHORTNESS OF BREATH  *UNUSUAL BRUISING OR BLEEDING  TENDERNESS IN MOUTH AND THROAT WITH OR WITHOUT PRESENCE OF ULCERS  *URINARY PROBLEMS  *BOWEL PROBLEMS  UNUSUAL RASH Items with * indicate a potential emergency and should be followed up as soon as possible.  Feel free to call the clinic should you have any questions or concerns. The clinic phone number is [phone].  Please show the [place]  at check-in to the Emergency Department and triage nurse.

## 2018-05-15 NOTE — Telephone Encounter (Signed)
Printed avs and calender of upcoming appointment. Per 1/29 los 

## 2018-05-16 ENCOUNTER — Telehealth: Payer: Self-pay | Admitting: *Deleted

## 2018-05-16 ENCOUNTER — Encounter: Payer: Self-pay | Admitting: Gastroenterology

## 2018-05-16 NOTE — Telephone Encounter (Signed)
He is not going to take Xeloda, please let the pharmacy know, thanks   [name] MD

## 2018-05-16 NOTE — Telephone Encounter (Addendum)
Received vm call from Dave Vaughn/[facility] stating they had received prior authorization on capecitabine & requested script. I don't think pt is on this drug but will verify with Dr Burr Medico.  Called [facility] & informed that pt is not taking capecitabine.

## 2018-05-17 ENCOUNTER — Inpatient Hospital Stay: Payer: 59

## 2018-05-17 VITALS — BP 130/78 | HR 83 | Resp 18

## 2018-05-17 DIAGNOSIS — Z5111 Encounter for antineoplastic chemotherapy: Secondary | ICD-10-CM | POA: Diagnosis not present

## 2018-05-17 DIAGNOSIS — C186 Malignant neoplasm of descending colon: Secondary | ICD-10-CM

## 2018-05-17 MED ORDER — SODIUM CHLORIDE 0.9% FLUSH
10.0000 mL | INTRAVENOUS | Status: DC | PRN
  Administered 2018-05-17: 10 mL
  Filled 2018-05-17: qty 10

## 2018-05-17 MED ORDER — HEPARIN SOD (PORK) LOCK FLUSH 100 UNIT/ML IV SOLN
500.0000 [IU] | Freq: Once | INTRAVENOUS | Status: AC | PRN
  Administered 2018-05-17: 500 [IU]
  Filled 2018-05-17: qty 5

## 2018-05-19 ENCOUNTER — Telehealth: Payer: Self-pay | Admitting: *Deleted

## 2018-05-19 NOTE — Telephone Encounter (Signed)
TCT patient to review recent labs with him. Advised him to increase po fluid intake as creatinine remains slightly elevated. Pt voiced understanding and is aware of upcoming appts.

## 2018-06-05 NOTE — Progress Notes (Signed)
CHEST XRAY 1 VIEW 03-07-18 Epic EKG 01-17-18 Epic

## 2018-06-05 NOTE — Patient Instructions (Signed)
Dave Vaughn  06/05/2018   Your procedure is scheduled on: 06-12-18  Report to [facility] Main  Entrance  Report to admitting at 515 AM    Call this number if you have problems the morning of surgery [phone]   Remember:  [place], NO [place].  DRINK 2 PRESURGERY ENSURE DRINKS THE NIGHT BEFORE SURGERY AT  1000 PM AND 1 PRESURGERY DRINK THE DAY OF THE PROCEDURE 3 HOURS PRIOR TO SCHEDULED SURGERY. NO SOLIDS AFTER MIDNIGHT THE DAY PRIOR TO THE SURGERY. NOTHING BY MOUTH EXCEPT CLEAR LIQUIDS UNTIL THREE HOURS PRIOR TO SCHEDULED SURGERY. PLEASE FINISH PRESURGERY ENSURE DRINK PER SURGEON ORDER 3 HOURS PRIOR TO SCHEDULED SURGERY TIME WHICH NEEDS TO BE COMPLETED AT 415 AM  FOLLOW ALL BOWEL PRE INSTRUCTIONS PER DR GROSS, DRINK PLENTY OF CLEAR LIQUIDS WITH BOWEL PREP TO PREVENT DEHYDRATION.   CLEAR LIQUID DIET   Foods Allowed                                                                     Foods Excluded  Coffee and tea, regular and decaf                             liquids that you cannot  Plain Jell-O in any flavor                                             see through such as: Fruit ices (not with fruit pulp)                                     milk, soups, orange juice  Iced Popsicles                                    All solid food Carbonated beverages, regular and diet                                    Cranberry, grape and apple juices Sports drinks like Gatorade Lightly seasoned clear broth or consume(fat free) Sugar, honey syrup  Sample Menu Breakfast                                Lunch                                     Supper Cranberry juice                    Beef broth  Chicken broth Jell-O                                     Grape juice                           Apple juice Coffee or tea                        Jell-O                                       Popsicle                                                Coffee or tea                        Coffee or tea  _____________________________________________________________________     Take these medicines the morning of surgery with A SIP OF WATER: AMIODARONE (PACERONE), TAMSULOSIN (FLOMAX)                                You may not have any metal on your body including hair pins and              piercings  Do not wear jewelry, make-up, lotions, powders or perfumes, deodorant             Do not wear nail polish.  Do not shave  48 hours prior to surgery.              Men may shave face and neck.   Do not bring valuables to the hospital. [place].  Contacts, dentures or bridgework may not be worn into surgery.  Leave suitcase in the car. After surgery it may be brought to your room.                  Please read over the following fact sheets you were given: _____________________________________________________________________ [facility] - Preparing for Surgery Before surgery, you can play an important role.  Because skin is not sterile, your skin needs to be as free of germs as possible.  You can reduce the number of germs on your skin by washing with CHG (chlorahexidine gluconate) soap before surgery.  CHG is an antiseptic cleaner which kills germs and bonds with the skin to continue killing germs even after washing. Please DO NOT use if you have an allergy to CHG or antibacterial soaps.  If your skin becomes reddened/irritated stop using the CHG and inform your nurse when you arrive at Short Stay. Do not shave (including legs and underarms) for at least 48 hours prior to the first CHG shower.  You may shave your face/neck. Please follow these instructions carefully:  1.  Shower with CHG Soap the night before surgery and the  morning of Surgery.  2.  If you choose to wash your hair, wash your hair first as usual  with your  normal   shampoo.  3.  After you shampoo, rinse your hair and body thoroughly to remove the  shampoo.                           4.  Use CHG as you would any other liquid soap.  You can apply chg directly  to the skin and wash                       Gently with a scrungie or clean washcloth.  5.  Apply the CHG Soap to your body ONLY FROM THE NECK DOWN.   Do not use on face/ open                           Wound or open sores. Avoid contact with eyes, ears mouth and genitals (private parts).                       Wash face,  Genitals (private parts) with your normal soap.             6.  Wash thoroughly, paying special attention to the area where your surgery  will be performed.  7.  Thoroughly rinse your body with warm water from the neck down.  8.  DO NOT shower/wash with your normal soap after using and rinsing off  the CHG Soap.                9.  Pat yourself dry with a clean towel.            10.  Wear clean pajamas.            11.  Place clean sheets on your bed the night of your first shower and do not  sleep with pets. Day of Surgery : Do not apply any lotions/deodorants the morning of surgery.  Please wear clean clothes to the hospital/surgery center.  FAILURE TO FOLLOW THESE INSTRUCTIONS MAY RESULT IN THE CANCELLATION OF YOUR SURGERY PATIENT SIGNATURE_________________________________  NURSE SIGNATURE__________________________________  ________________________________________________________________________   Adam Phenix  An incentive spirometer is a tool that can help keep your lungs clear and active. This tool measures how well you are filling your lungs with each breath. Taking long deep breaths may help reverse or decrease the chance of developing breathing (pulmonary) problems (especially infection) following:  A long period of time when you are unable to move or be active. BEFORE THE PROCEDURE   If the spirometer includes an indicator to show your best effort, your nurse or  respiratory therapist will set it to a desired goal.  If possible, sit up straight or lean slightly forward. Try not to slouch.  Hold the incentive spirometer in an upright position. INSTRUCTIONS FOR USE  1. Sit on the edge of your bed if possible, or sit up as far as you can in bed or on a chair. 2. Hold the incentive spirometer in an upright position. 3. Breathe out normally. 4. Place the mouthpiece in your mouth and seal your lips tightly around it. 5. Breathe in slowly and as deeply as possible, raising the piston or the ball toward the top of the column. 6. Hold your breath for 3-5 seconds or for as long as possible. Allow the piston or ball to fall to the bottom of the column. 7. Remove the mouthpiece from  your mouth and breathe out normally. 8. Rest for a few seconds and repeat Steps 1 through 7 at least 10 times every 1-2 hours when you are awake. Take your time and take a few normal breaths between deep breaths. 9. The spirometer may include an indicator to show your best effort. Use the indicator as a goal to work toward during each repetition. 10. After each set of 10 deep breaths, practice coughing to be sure your lungs are clear. If you have an incision (the cut made at the time of surgery), support your incision when coughing by placing a pillow or rolled up towels firmly against it. Once you are able to get out of bed, walk around indoors and cough well. You may stop using the incentive spirometer when instructed by your caregiver.  RISKS AND COMPLICATIONS  Take your time so you do not get dizzy or light-headed.  If you are in pain, you may need to take or ask for pain medication before doing incentive spirometry. It is harder to take a deep breath if you are having pain. AFTER USE  Rest and breathe slowly and easily.  It can be helpful to keep track of a log of your progress. Your caregiver can provide you with a simple table to help with this. If you are using the  spirometer at home, follow these instructions: [place] IF:   You are having difficultly using the spirometer.  You have trouble using the spirometer as often as instructed.  Your pain medication is not giving enough relief while using the spirometer.  You develop fever of 100.5 F (38.1 C) or higher. SEEK IMMEDIATE MEDICAL CARE IF:   You cough up bloody sputum that had not been present before.  You develop fever of 102 F (38.9 C) or greater.  You develop worsening pain at or near the incision site. MAKE SURE YOU:   Understand these instructions.  Will watch your condition.  Will get help right away if you are not doing well or get worse. Document Released: 10/04/2006 Document Revised: 08/16/2011 Document Reviewed: 12/05/2006 [facility] Patient Information 2014 [place], [place].   ________________________________________________________________________

## 2018-06-09 ENCOUNTER — Encounter (HOSPITAL_COMMUNITY)
Admission: RE | Admit: 2018-06-09 | Discharge: 2018-06-09 | Disposition: A | Discharge status: Home / Self Care | Payer: 59 | Source: Ambulatory Visit | Attending: Surgery | Admitting: Surgery

## 2018-06-09 ENCOUNTER — Other Ambulatory Visit: Payer: Self-pay

## 2018-06-09 ENCOUNTER — Encounter (HOSPITAL_COMMUNITY): Payer: Self-pay

## 2018-06-09 DIAGNOSIS — Z01812 Encounter for preprocedural laboratory examination: Secondary | ICD-10-CM | POA: Diagnosis not present

## 2018-06-09 LAB — CBC
HCT: 42.6 % (ref 39.0–52.0)
HEMOGLOBIN: 13.5 g/dL (ref 13.0–17.0)
MCH: 30.8 pg (ref 26.0–34.0)
MCHC: 31.7 g/dL (ref 30.0–36.0)
MCV: 97.3 fL (ref 80.0–100.0)
NRBC: 0 % (ref 0.0–0.2)
Platelets: 213 10*3/uL (ref 150–400)
RBC: 4.38 MIL/uL (ref 4.22–5.81)
RDW: 15.9 % — ABNORMAL HIGH (ref 11.5–15.5)
WBC: 7 10*3/uL (ref 4.0–10.5)

## 2018-06-09 LAB — BASIC METABOLIC PANEL
ANION GAP: 10 (ref 5–15)
BUN: 18 mg/dL (ref 8–23)
CO2: 27 mmol/L (ref 22–32)
Calcium: 9.2 mg/dL (ref 8.9–10.3)
Chloride: 105 mmol/L (ref 98–111)
Creatinine, Ser: 1.42 mg/dL — ABNORMAL HIGH (ref 0.61–1.24)
GFR calc Af Amer: 57 mL/min — ABNORMAL LOW (ref >60)
GFR calc non Af Amer: 49 mL/min — ABNORMAL LOW (ref >60)
Glucose, Bld: 101 mg/dL — ABNORMAL HIGH (ref 70–99)
POTASSIUM: 4.8 mmol/L (ref 3.5–5.1)
Sodium: 142 mmol/L (ref 135–145)

## 2018-06-09 LAB — HEMOGLOBIN A1C
Hgb A1c MFr Bld: 5.1 % (ref 4.8–5.6)
Mean Plasma Glucose: 99.67 mg/dL

## 2018-06-09 NOTE — Progress Notes (Signed)
cardaic clearance note dr [name] 05-15-18 on chart for 06-12-18 surgery ekg 05-15-18 dr [name] on chart

## 2018-06-09 NOTE — Progress Notes (Signed)
Patient given bowel prep instructions from dr gross that were faxed from office. Patient vocalized understanding bowel prep instructions

## 2018-06-11 MED ORDER — BUPIVACAINE LIPOSOME 1.3 % IJ SUSP
20.0000 mL | Freq: Once | INTRAMUSCULAR | Status: DC
  Filled 2018-06-11: qty 20

## 2018-06-11 MED ORDER — SODIUM CHLORIDE 0.9 % IV SOLN
INTRAVENOUS | Status: DC
  Filled 2018-06-11 (×2): qty 6

## 2018-06-11 NOTE — Progress Notes (Deleted)
Dave Vaughn DOB: [birth year] Encounter date: 06/14/2018  This is a 73 y.o. male who presents for physical/chronic condition review  History of present illness/Additional concerns:  ***  Past Medical History:  Diagnosis Date  . 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 Anemia    taking iron supplements  . Atrial fibrillation with RVR ([place])   . Diverticulitis   . Dysrhythmia    afib  . History of kidney stones    Past Surgical History:  Procedure Laterality Date  . ANKLE SURGERY Left    when he was in college  . COLON RESECTION N/A 01/11/2018   Procedure: LEFT COLON RESECTION, TAKEDOWN SPLENIC FLEXURE, COLOSTOMY;  Surgeon: [name], MD;  Location: [facility];  Service: General;  Laterality: N/A;  . COLONOSCOPY  01/11/2018   Procedure: COLONOSCOPY;  Surgeon: Jackquline Denmark, MD;  Location: [facility];  Service: Endoscopy;;  . colonscopy  05/10/2018  . PORTACATH PLACEMENT Right 03/07/2018   Procedure: INSERTION PORT-A-CATH RIGHT SUBCLAVIAN;  Surgeon: [name], MD;  Location: [facility];  Service: General;  Laterality: Right;  . thumb surgery   2018   cyst removal   No Known Allergies No outpatient medications have been marked as taking for the 06/14/18 encounter (Appointment) with [name], MD.   Social History   Tobacco Use  . Smoking status: Never Smoker  . Smokeless tobacco: Former Systems developer    Types: Chew  Substance Use Topics  . Alcohol use: Yes    Comment: drinks socially about 2x/week   Family History  Problem Relation Age of Onset  . Breast cancer Mother 61       metastatin; recurrence x7  . Heart attack Father 67  . Cancer Daughter        melanoma, leukemia  . Esophageal cancer Neg Hx   . Colon cancer Neg Hx   . Rectal cancer Neg Hx   . Ulcerative colitis Neg Hx      Review of Systems  CBC:  Lab Results  Component Value Date   WBC 7.0 06/09/2018   HGB 13.5 06/09/2018   HGB 12.6 (L) 05/15/2018   HCT 42.6 06/09/2018   MCH 30.8 06/09/2018   MCHC 31.7  06/09/2018   RDW 15.9 (H) 06/09/2018   PLT 213 06/09/2018   PLT 196 05/15/2018   CMP: Lab Results  Component Value Date   NA 142 06/09/2018   K 4.8 06/09/2018   CL 105 06/09/2018   CO2 27 06/09/2018   ANIONGAP 10 06/09/2018   GLUCOSE 101 (H) 06/09/2018   BUN 18 06/09/2018   CREATININE 1.42 (H) 06/09/2018   CREATININE 1.59 (H) 05/15/2018   GFRAA 57 (L) 06/09/2018   GFRAA 50 (L) 05/15/2018   CALCIUM 9.2 06/09/2018   PROT 7.0 05/15/2018   BILITOT 0.3 05/15/2018   ALKPHOS 119 05/15/2018   ALT 30 05/15/2018   AST 24 05/15/2018   LIPID:No results found for: CHOL, TRIG, HDL, LDLCALC, LABVLDL  Objective:  There were no vitals taken for this visit.      BP Readings from Last 3 Encounters:  06/09/18 (!) 126/96  05/17/18 130/78  05/15/18 (!) 141/81   Wt Readings from Last 3 Encounters:  06/09/18 226 lb (102.5 kg)  05/15/18 224 lb 11.2 oz (101.9 kg)  05/10/18 220 lb (99.8 kg)    Physical Exam  Assessment/Plan: Health Maintenance Due  Topic Date Due  . Hepatitis C Screening  04-13-1946  . TETANUS/TDAP  12/22/1965  . PNA vac Low Risk Adult (  1 of 2 - PCV13) 12/23/2011   Health Maintenance reviewed - {health maintenance:315237}.  There are no diagnoses linked to this encounter.  No follow-ups on file.  Micheline Rough, MD

## 2018-06-12 ENCOUNTER — Inpatient Hospital Stay (HOSPITAL_COMMUNITY): Payer: 59 | Admitting: Certified Registered Nurse Anesthetist

## 2018-06-12 ENCOUNTER — Encounter (HOSPITAL_COMMUNITY): Payer: Self-pay

## 2018-06-12 ENCOUNTER — Encounter (HOSPITAL_COMMUNITY)
Admission: RE | Disposition: A | Discharge status: Home / Self Care | Payer: Self-pay | Source: Home / Self Care | Attending: Surgery

## 2018-06-12 ENCOUNTER — Inpatient Hospital Stay (HOSPITAL_COMMUNITY)
Admission: RE | Admit: 2018-06-12 | Discharge: 2018-06-14 | DRG: 330 | Disposition: A | Discharge status: Home / Self Care | Payer: 59 | Attending: Surgery | Admitting: Surgery

## 2018-06-12 ENCOUNTER — Inpatient Hospital Stay (HOSPITAL_COMMUNITY): Payer: 59 | Admitting: Physician Assistant

## 2018-06-12 ENCOUNTER — Other Ambulatory Visit: Payer: Self-pay

## 2018-06-12 DIAGNOSIS — I482 Chronic atrial fibrillation, unspecified: Secondary | ICD-10-CM | POA: Diagnosis present

## 2018-06-12 DIAGNOSIS — Z7901 Long term (current) use of anticoagulants: Secondary | ICD-10-CM | POA: Diagnosis not present

## 2018-06-12 DIAGNOSIS — K660 Peritoneal adhesions (postprocedural) (postinfection): Secondary | ICD-10-CM | POA: Diagnosis present

## 2018-06-12 DIAGNOSIS — C186 Malignant neoplasm of descending colon: Secondary | ICD-10-CM | POA: Diagnosis present

## 2018-06-12 DIAGNOSIS — K435 Parastomal hernia without obstruction or  gangrene: Secondary | ICD-10-CM | POA: Diagnosis present

## 2018-06-12 DIAGNOSIS — E669 Obesity, unspecified: Secondary | ICD-10-CM | POA: Diagnosis present

## 2018-06-12 DIAGNOSIS — C786 Secondary malignant neoplasm of retroperitoneum and peritoneum: Secondary | ICD-10-CM | POA: Diagnosis present

## 2018-06-12 DIAGNOSIS — Z6831 Body mass index (BMI) 31.0-31.9, adult: Secondary | ICD-10-CM | POA: Diagnosis not present

## 2018-06-12 DIAGNOSIS — N183 Chronic kidney disease, stage 3 unspecified: Secondary | ICD-10-CM

## 2018-06-12 HISTORY — PX: LYSIS OF ADHESION: SHX5961

## 2018-06-12 HISTORY — DX: Unspecified intestinal obstruction, unspecified as to partial versus complete obstruction: K56.609

## 2018-06-12 HISTORY — PX: PROCTOSCOPY: SHX2266

## 2018-06-12 SURGERY — CLOSURE, COLOSTOMY, ROBOT-ASSISTED
Anesthesia: General | Site: Rectum

## 2018-06-12 MED ORDER — ALVIMOPAN 12 MG PO CAPS
12.0000 mg | ORAL_CAPSULE | Freq: Two times a day (BID) | ORAL | Status: DC
  Administered 2018-06-13 (×2): 12 mg via ORAL
  Filled 2018-06-12 (×3): qty 1

## 2018-06-12 MED ORDER — GABAPENTIN 300 MG PO CAPS
300.0000 mg | ORAL_CAPSULE | Freq: Two times a day (BID) | ORAL | Status: DC
  Administered 2018-06-12 – 2018-06-14 (×4): 300 mg via ORAL
  Filled 2018-06-12 (×4): qty 1

## 2018-06-12 MED ORDER — LIDOCAINE 2% (20 MG/ML) 5 ML SYRINGE
INTRAMUSCULAR | Status: DC | PRN
  Administered 2018-06-12: 1 mg/kg/h via INTRAVENOUS

## 2018-06-12 MED ORDER — BISACODYL 5 MG PO TBEC
20.0000 mg | DELAYED_RELEASE_TABLET | Freq: Once | ORAL | Status: DC
  Filled 2018-06-12: qty 4

## 2018-06-12 MED ORDER — PHENYLEPHRINE HCL 10 MG/ML IJ SOLN
INTRAMUSCULAR | Status: AC
  Filled 2018-06-12: qty 2

## 2018-06-12 MED ORDER — MIDAZOLAM HCL 2 MG/2ML IJ SOLN
INTRAMUSCULAR | Status: AC
  Filled 2018-06-12: qty 2

## 2018-06-12 MED ORDER — ROCURONIUM BROMIDE 10 MG/ML (PF) SYRINGE
PREFILLED_SYRINGE | INTRAVENOUS | Status: AC
  Filled 2018-06-12: qty 10

## 2018-06-12 MED ORDER — MAGIC MOUTHWASH
15.0000 mL | Freq: Four times a day (QID) | ORAL | Status: DC | PRN
  Filled 2018-06-12: qty 15

## 2018-06-12 MED ORDER — ENOXAPARIN SODIUM 40 MG/0.4ML ~~LOC~~ SOLN
40.0000 mg | SUBCUTANEOUS | Status: DC
  Administered 2018-06-13 – 2018-06-14 (×2): 40 mg via SUBCUTANEOUS
  Filled 2018-06-12 (×2): qty 0.4

## 2018-06-12 MED ORDER — PHENYLEPHRINE 40 MCG/ML (10ML) SYRINGE FOR IV PUSH (FOR BLOOD PRESSURE SUPPORT)
PREFILLED_SYRINGE | INTRAVENOUS | Status: DC | PRN
  Administered 2018-06-12 (×7): 80 ug via INTRAVENOUS

## 2018-06-12 MED ORDER — HYDROMORPHONE HCL 1 MG/ML IJ SOLN
0.5000 mg | INTRAMUSCULAR | Status: DC | PRN
  Administered 2018-06-12 – 2018-06-13 (×4): 1 mg via INTRAVENOUS
  Filled 2018-06-12 (×3): qty 1

## 2018-06-12 MED ORDER — CHLORHEXIDINE GLUCONATE CLOTH 2 % EX PADS: 6.0000 | MEDICATED_PAD | Freq: Once | CUTANEOUS | Status: DC

## 2018-06-12 MED ORDER — ENOXAPARIN SODIUM 40 MG/0.4ML ~~LOC~~ SOLN
SUBCUTANEOUS | Status: AC
  Administered 2018-06-12: 40 mg
  Filled 2018-06-12: qty 0.4

## 2018-06-12 MED ORDER — ONDANSETRON HCL 4 MG/2ML IJ SOLN: 4.0000 mg | Freq: Once | INTRAMUSCULAR | Status: DC | PRN

## 2018-06-12 MED ORDER — EPHEDRINE SULFATE-NACL 50-0.9 MG/10ML-% IV SOSY
PREFILLED_SYRINGE | INTRAVENOUS | Status: DC | PRN
  Administered 2018-06-12: 10 mg via INTRAVENOUS

## 2018-06-12 MED ORDER — PHENYLEPHRINE 40 MCG/ML (10ML) SYRINGE FOR IV PUSH (FOR BLOOD PRESSURE SUPPORT)
PREFILLED_SYRINGE | INTRAVENOUS | Status: AC
  Filled 2018-06-12: qty 10

## 2018-06-12 MED ORDER — SUGAMMADEX SODIUM 200 MG/2ML IV SOLN
INTRAVENOUS | Status: DC | PRN
  Administered 2018-06-12: 400 mg via INTRAVENOUS

## 2018-06-12 MED ORDER — ENSURE SURGERY PO LIQD
237.0000 mL | Freq: Two times a day (BID) | ORAL | Status: DC
  Administered 2018-06-13 – 2018-06-14 (×2): 237 mL via ORAL
  Filled 2018-06-12 (×5): qty 237

## 2018-06-12 MED ORDER — HYDRALAZINE HCL 20 MG/ML IJ SOLN: 10.0000 mg | INTRAMUSCULAR | Status: DC | PRN

## 2018-06-12 MED ORDER — ACETAMINOPHEN 500 MG PO TABS
ORAL_TABLET | ORAL | Status: AC
  Administered 2018-06-12: 1000 mg
  Filled 2018-06-12: qty 2

## 2018-06-12 MED ORDER — PROPOFOL 10 MG/ML IV BOLUS
INTRAVENOUS | Status: DC | PRN
  Administered 2018-06-12: 150 mg via INTRAVENOUS

## 2018-06-12 MED ORDER — LACTATED RINGERS IV SOLN
INTRAVENOUS | Status: DC
  Administered 2018-06-12: 17:00:00 via INTRAVENOUS

## 2018-06-12 MED ORDER — DIPHENHYDRAMINE HCL 12.5 MG/5ML PO ELIX: 12.5000 mg | ORAL_SOLUTION | Freq: Four times a day (QID) | ORAL | Status: DC | PRN

## 2018-06-12 MED ORDER — ONDANSETRON HCL 4 MG/2ML IJ SOLN
INTRAMUSCULAR | Status: DC | PRN
  Administered 2018-06-12: 4 mg via INTRAVENOUS

## 2018-06-12 MED ORDER — SODIUM CHLORIDE 0.9 % IV SOLN
2.0000 g | Freq: Two times a day (BID) | INTRAVENOUS | Status: AC
  Administered 2018-06-12: 2 g via INTRAVENOUS
  Filled 2018-06-12 (×2): qty 2

## 2018-06-12 MED ORDER — ACETAMINOPHEN 500 MG PO TABS: 1000.0000 mg | ORAL_TABLET | ORAL | Status: DC

## 2018-06-12 MED ORDER — LACTATED RINGERS IV SOLN
INTRAVENOUS | Status: DC
  Administered 2018-06-12 (×3): via INTRAVENOUS

## 2018-06-12 MED ORDER — ONDANSETRON HCL 4 MG/2ML IJ SOLN
INTRAMUSCULAR | Status: AC
  Filled 2018-06-12: qty 2

## 2018-06-12 MED ORDER — 0.9 % SODIUM CHLORIDE (POUR BTL) OPTIME
TOPICAL | Status: DC | PRN
  Administered 2018-06-12: 2000 mL

## 2018-06-12 MED ORDER — AMIODARONE HCL 200 MG PO TABS
200.0000 mg | ORAL_TABLET | Freq: Two times a day (BID) | ORAL | Status: DC
  Administered 2018-06-12 – 2018-06-14 (×4): 200 mg via ORAL
  Filled 2018-06-12 (×5): qty 1

## 2018-06-12 MED ORDER — GABAPENTIN 300 MG PO CAPS
ORAL_CAPSULE | ORAL | Status: AC
  Administered 2018-06-12: 300 mg
  Filled 2018-06-12: qty 1

## 2018-06-12 MED ORDER — GABAPENTIN 300 MG PO CAPS: 300.0000 mg | ORAL_CAPSULE | ORAL | Status: DC

## 2018-06-12 MED ORDER — LIP MEDEX EX OINT
1.0000 "application " | TOPICAL_OINTMENT | Freq: Two times a day (BID) | CUTANEOUS | Status: DC
  Administered 2018-06-12 – 2018-06-14 (×4): 1 via TOPICAL
  Filled 2018-06-12 (×3): qty 7

## 2018-06-12 MED ORDER — SACCHAROMYCES BOULARDII 250 MG PO CAPS
250.0000 mg | ORAL_CAPSULE | Freq: Two times a day (BID) | ORAL | Status: DC
  Administered 2018-06-12 – 2018-06-14 (×4): 250 mg via ORAL
  Filled 2018-06-12 (×4): qty 1

## 2018-06-12 MED ORDER — ONDANSETRON HCL 4 MG PO TABS: 4.0000 mg | ORAL_TABLET | Freq: Four times a day (QID) | ORAL | Status: DC | PRN

## 2018-06-12 MED ORDER — BUPIVACAINE LIPOSOME 1.3 % IJ SUSP
INTRAMUSCULAR | Status: DC | PRN
  Administered 2018-06-12: 20 mL

## 2018-06-12 MED ORDER — SODIUM CHLORIDE 0.9 % IV SOLN
INTRAVENOUS | Status: AC
  Filled 2018-06-12: qty 2

## 2018-06-12 MED ORDER — LIDOCAINE 2% (20 MG/ML) 5 ML SYRINGE
INTRAMUSCULAR | Status: AC
  Filled 2018-06-12: qty 5

## 2018-06-12 MED ORDER — FENTANYL CITRATE (PF) 250 MCG/5ML IJ SOLN
INTRAMUSCULAR | Status: DC | PRN
  Administered 2018-06-12: 100 ug via INTRAVENOUS
  Administered 2018-06-12 (×3): 50 ug via INTRAVENOUS

## 2018-06-12 MED ORDER — METOCLOPRAMIDE HCL 5 MG/ML IJ SOLN
10.0000 mg | Freq: Four times a day (QID) | INTRAMUSCULAR | Status: DC | PRN
  Administered 2018-06-13: 10 mg via INTRAVENOUS
  Filled 2018-06-12: qty 2

## 2018-06-12 MED ORDER — ONDANSETRON HCL 4 MG/2ML IJ SOLN
4.0000 mg | Freq: Four times a day (QID) | INTRAMUSCULAR | Status: DC | PRN
  Administered 2018-06-13: 4 mg via INTRAVENOUS
  Filled 2018-06-12: qty 2

## 2018-06-12 MED ORDER — DEXAMETHASONE SODIUM PHOSPHATE 10 MG/ML IJ SOLN
INTRAMUSCULAR | Status: AC
  Filled 2018-06-12: qty 1

## 2018-06-12 MED ORDER — HYDROMORPHONE HCL 1 MG/ML IJ SOLN
INTRAMUSCULAR | Status: AC
  Filled 2018-06-12: qty 1

## 2018-06-12 MED ORDER — METRONIDAZOLE 500 MG PO TABS
1000.0000 mg | ORAL_TABLET | ORAL | Status: DC
  Filled 2018-06-12: qty 2

## 2018-06-12 MED ORDER — BUPIVACAINE-EPINEPHRINE (PF) 0.25% -1:200000 IJ SOLN
INTRAMUSCULAR | Status: DC | PRN
  Administered 2018-06-12: 60 mL

## 2018-06-12 MED ORDER — LACTATED RINGERS IR SOLN
Status: DC | PRN
  Administered 2018-06-12: 1000 mL

## 2018-06-12 MED ORDER — DILTIAZEM HCL ER COATED BEADS 240 MG PO CP24
240.0000 mg | ORAL_CAPSULE | Freq: Every day | ORAL | Status: DC
  Administered 2018-06-12: 240 mg via ORAL
  Filled 2018-06-12 (×2): qty 1

## 2018-06-12 MED ORDER — ALVIMOPAN 12 MG PO CAPS
ORAL_CAPSULE | ORAL | Status: AC
  Administered 2018-06-12: 12 mg
  Filled 2018-06-12: qty 1

## 2018-06-12 MED ORDER — ROCURONIUM BROMIDE 10 MG/ML (PF) SYRINGE
PREFILLED_SYRINGE | INTRAVENOUS | Status: DC | PRN
  Administered 2018-06-12: 40 mg via INTRAVENOUS
  Administered 2018-06-12: 60 mg via INTRAVENOUS
  Administered 2018-06-12: 40 mg via INTRAVENOUS

## 2018-06-12 MED ORDER — TAMSULOSIN HCL 0.4 MG PO CAPS
0.4000 mg | ORAL_CAPSULE | Freq: Two times a day (BID) | ORAL | Status: DC
  Administered 2018-06-12 – 2018-06-14 (×4): 0.4 mg via ORAL
  Filled 2018-06-12 (×4): qty 1

## 2018-06-12 MED ORDER — POLYETHYLENE GLYCOL 3350 17 GM/SCOOP PO POWD
1.0000 | Freq: Once | ORAL | Status: DC
  Filled 2018-06-12: qty 255

## 2018-06-12 MED ORDER — LIDOCAINE 2% (20 MG/ML) 5 ML SYRINGE
INTRAMUSCULAR | Status: DC | PRN
  Administered 2018-06-12: 60 mg via INTRAVENOUS

## 2018-06-12 MED ORDER — EPHEDRINE 5 MG/ML INJ
INTRAVENOUS | Status: AC
  Filled 2018-06-12: qty 10

## 2018-06-12 MED ORDER — SODIUM CHLORIDE 0.9 % IV SOLN
INTRAVENOUS | Status: DC | PRN
  Administered 2018-06-12: 25 ug/min via INTRAVENOUS

## 2018-06-12 MED ORDER — FENTANYL CITRATE (PF) 100 MCG/2ML IJ SOLN
INTRAMUSCULAR | Status: AC
  Filled 2018-06-12: qty 4

## 2018-06-12 MED ORDER — FENTANYL CITRATE (PF) 100 MCG/2ML IJ SOLN
25.0000 ug | INTRAMUSCULAR | Status: DC | PRN
  Administered 2018-06-12 (×3): 50 ug via INTRAVENOUS

## 2018-06-12 MED ORDER — ALVIMOPAN 12 MG PO CAPS: 12.0000 mg | ORAL_CAPSULE | ORAL | Status: DC

## 2018-06-12 MED ORDER — METOPROLOL TARTRATE 5 MG/5ML IV SOLN: 5.0000 mg | Freq: Four times a day (QID) | INTRAVENOUS | Status: DC | PRN

## 2018-06-12 MED ORDER — SODIUM CHLORIDE 0.9 % IV SOLN
INTRAVENOUS | Status: DC | PRN
  Administered 2018-06-12: 1000 mL via INTRAPERITONEAL

## 2018-06-12 MED ORDER — BUPIVACAINE-EPINEPHRINE (PF) 0.25% -1:200000 IJ SOLN
INTRAMUSCULAR | Status: AC
  Filled 2018-06-12: qty 60

## 2018-06-12 MED ORDER — ALUM & MAG HYDROXIDE-SIMETH 200-200-20 MG/5ML PO SUSP: 30.0000 mL | Freq: Four times a day (QID) | ORAL | Status: DC | PRN

## 2018-06-12 MED ORDER — PROPOFOL 10 MG/ML IV BOLUS
INTRAVENOUS | Status: AC
  Filled 2018-06-12: qty 20

## 2018-06-12 MED ORDER — ACETAMINOPHEN 500 MG PO TABS
1000.0000 mg | ORAL_TABLET | Freq: Four times a day (QID) | ORAL | Status: DC
  Administered 2018-06-12 – 2018-06-14 (×7): 1000 mg via ORAL
  Filled 2018-06-12 (×7): qty 2

## 2018-06-12 MED ORDER — MIDAZOLAM HCL 5 MG/5ML IJ SOLN
INTRAMUSCULAR | Status: DC | PRN
  Administered 2018-06-12: 2 mg via INTRAVENOUS

## 2018-06-12 MED ORDER — PSYLLIUM 95 % PO PACK
1.0000 | PACK | Freq: Every day | ORAL | Status: DC
  Administered 2018-06-12 – 2018-06-13 (×2): 1 via ORAL
  Filled 2018-06-12 (×3): qty 1

## 2018-06-12 MED ORDER — FENTANYL CITRATE (PF) 250 MCG/5ML IJ SOLN
INTRAMUSCULAR | Status: AC
  Filled 2018-06-12: qty 5

## 2018-06-12 MED ORDER — SUGAMMADEX SODIUM 500 MG/5ML IV SOLN
INTRAVENOUS | Status: AC
  Filled 2018-06-12: qty 5

## 2018-06-12 MED ORDER — PROCHLORPERAZINE EDISYLATE 10 MG/2ML IJ SOLN
5.0000 mg | Freq: Four times a day (QID) | INTRAMUSCULAR | Status: DC | PRN
  Administered 2018-06-13: 5 mg via INTRAVENOUS
  Filled 2018-06-12: qty 2

## 2018-06-12 MED ORDER — DEXAMETHASONE SODIUM PHOSPHATE 10 MG/ML IJ SOLN
INTRAMUSCULAR | Status: DC | PRN
  Administered 2018-06-12: 10 mg via INTRAVENOUS

## 2018-06-12 MED ORDER — ENOXAPARIN SODIUM 40 MG/0.4ML ~~LOC~~ SOLN: 40.0000 mg | Freq: Once | SUBCUTANEOUS | Status: DC

## 2018-06-12 MED ORDER — ALBUMIN HUMAN 5 % IV SOLN
INTRAVENOUS | Status: DC | PRN
  Administered 2018-06-12: 11:00:00 via INTRAVENOUS

## 2018-06-12 MED ORDER — ALPRAZOLAM 0.25 MG PO TABS: 0.2500 mg | ORAL_TABLET | Freq: Every evening | ORAL | Status: DC | PRN

## 2018-06-12 MED ORDER — TRAMADOL HCL 50 MG PO TABS
50.0000 mg | ORAL_TABLET | Freq: Four times a day (QID) | ORAL | Status: DC | PRN
  Administered 2018-06-13: 50 mg via ORAL
  Administered 2018-06-13: 100 mg via ORAL
  Filled 2018-06-12: qty 2
  Filled 2018-06-12: qty 1
  Filled 2018-06-12: qty 2

## 2018-06-12 MED ORDER — PROCHLORPERAZINE MALEATE 10 MG PO TABS: 10.0000 mg | ORAL_TABLET | Freq: Four times a day (QID) | ORAL | Status: DC | PRN

## 2018-06-12 MED ORDER — DIPHENHYDRAMINE HCL 50 MG/ML IJ SOLN: 12.5000 mg | Freq: Four times a day (QID) | INTRAMUSCULAR | Status: DC | PRN

## 2018-06-12 MED ORDER — SODIUM CHLORIDE 0.9 % IV SOLN
2.0000 g | INTRAVENOUS | Status: AC
  Administered 2018-06-12: 2 g via INTRAVENOUS

## 2018-06-12 MED ORDER — NEOMYCIN SULFATE 500 MG PO TABS
1000.0000 mg | ORAL_TABLET | ORAL | Status: DC
  Filled 2018-06-12: qty 2

## 2018-06-12 MED ORDER — SODIUM CHLORIDE 0.9 % IV SOLN: 1000.0000 mL | Freq: Three times a day (TID) | INTRAVENOUS | Status: AC | PRN

## 2018-06-12 SURGICAL SUPPLY — 106 items
APPLIER CLIP 5 13 M/L LIGAMAX5 (MISCELLANEOUS)
APPLIER CLIP ROT 10 11.4 M/L (STAPLE)
APR CLP MED LRG 11.4X10 (STAPLE)
APR CLP MED LRG 5 ANG JAW (MISCELLANEOUS)
BAG URINE DRAINAGE (UROLOGICAL SUPPLIES) ×1 IMPLANT
BLADE EXTENDED COATED 6.5IN (ELECTRODE) ×3 IMPLANT
CANNULA REDUC XI 12-8 STAPL (CANNULA) ×1
CANNULA REDUCER 12-8 DVNC XI (CANNULA) ×2 IMPLANT
CATH FOLEY SILVER 30CC 26FR (CATHETERS) ×1 IMPLANT
CELLS DAT CNTRL 66122 CELL SVR (MISCELLANEOUS) IMPLANT
CHLORAPREP W/TINT 26ML (MISCELLANEOUS) ×3 IMPLANT
CLIP APPLIE 5 13 M/L LIGAMAX5 (MISCELLANEOUS) IMPLANT
CLIP APPLIE ROT 10 11.4 M/L (STAPLE) IMPLANT
CLIP VESOLOCK LG 6/CT PURPLE (CLIP) IMPLANT
CLIP VESOLOCK MED LG 6/CT (CLIP) IMPLANT
COVER SURGICAL LIGHT HANDLE (MISCELLANEOUS) ×6 IMPLANT
COVER TIP SHEARS 8 DVNC (MISCELLANEOUS) ×2 IMPLANT
COVER TIP SHEARS 8MM DA VINCI (MISCELLANEOUS) ×1
COVER WAND RF STERILE (DRAPES) ×1 IMPLANT
DECANTER SPIKE VIAL GLASS SM (MISCELLANEOUS) ×3 IMPLANT
DEVICE TROCAR PUNCTURE CLOSURE (ENDOMECHANICALS) IMPLANT
DRAIN CHANNEL 19F RND (DRAIN) ×3 IMPLANT
DRAPE ARM DVNC X/XI (DISPOSABLE) ×8 IMPLANT
DRAPE COLUMN DVNC XI (DISPOSABLE) ×2 IMPLANT
DRAPE DA VINCI XI ARM (DISPOSABLE) ×4
DRAPE DA VINCI XI COLUMN (DISPOSABLE) ×1
DRAPE STERI IOBAN 125X83 (DRAPES) ×1 IMPLANT
DRAPE SURG IRRIG POUCH 19X23 (DRAPES) ×3 IMPLANT
DRSG OPSITE POSTOP 4X10 (GAUZE/BANDAGES/DRESSINGS) IMPLANT
DRSG OPSITE POSTOP 4X6 (GAUZE/BANDAGES/DRESSINGS) IMPLANT
DRSG OPSITE POSTOP 4X8 (GAUZE/BANDAGES/DRESSINGS) ×1 IMPLANT
DRSG TEGADERM 2-3/8X2-3/4 SM (GAUZE/BANDAGES/DRESSINGS) ×11 IMPLANT
DRSG TEGADERM 4X4.75 (GAUZE/BANDAGES/DRESSINGS) ×3 IMPLANT
ELECT PENCIL ROCKER SW 15FT (MISCELLANEOUS) ×3 IMPLANT
ELECT REM PT RETURN 15FT ADLT (MISCELLANEOUS) ×3 IMPLANT
ENDOLOOP SUT PDS II  0 18 (SUTURE)
ENDOLOOP SUT PDS II 0 18 (SUTURE) IMPLANT
EVACUATOR SILICONE 100CC (DRAIN) ×3 IMPLANT
GAUZE SPONGE 2X2 8PLY STRL LF (GAUZE/BANDAGES/DRESSINGS) ×2 IMPLANT
GAUZE SPONGE 4X4 12PLY STRL (GAUZE/BANDAGES/DRESSINGS) IMPLANT
GLOVE ECLIPSE 8.0 STRL XLNG CF (GLOVE) ×13 IMPLANT
GLOVE INDICATOR 8.0 STRL GRN (GLOVE) ×13 IMPLANT
GOWN STRL REUS W/TWL XL LVL3 (GOWN DISPOSABLE) ×15 IMPLANT
GRASPER SUT TROCAR 14GX15 (MISCELLANEOUS) ×2 IMPLANT
HOLDER FOLEY CATH W/STRAP (MISCELLANEOUS) ×4 IMPLANT
IRRIG SUCT STRYKERFLOW 2 WTIP (MISCELLANEOUS) ×3
IRRIGATION SUCT STRKRFLW 2 WTP (MISCELLANEOUS) IMPLANT
KIT PROCEDURE DA VINCI SI (MISCELLANEOUS)
KIT PROCEDURE DVNC SI (MISCELLANEOUS) ×2 IMPLANT
NDL INSUFFLATION 14GA 120MM (NEEDLE) ×2 IMPLANT
NEEDLE INSUFFLATION 14GA 120MM (NEEDLE) ×3 IMPLANT
PACK CARDIOVASCULAR III (CUSTOM PROCEDURE TRAY) ×3 IMPLANT
PACK COLON (CUSTOM PROCEDURE TRAY) ×3 IMPLANT
PAD POSITIONING PINK XL (MISCELLANEOUS) ×3 IMPLANT
PORT LAP GEL ALEXIS MED 5-9CM (MISCELLANEOUS) ×3 IMPLANT
PROTECTOR NERVE ULNAR (MISCELLANEOUS) ×6 IMPLANT
RELOAD STAPLE 45 BLU REG DVNC (STAPLE) IMPLANT
RELOAD STAPLE 45 GRN THCK DVNC (STAPLE) IMPLANT
RETRACTOR WND ALEXIS 18 MED (MISCELLANEOUS) IMPLANT
RTRCTR WOUND ALEXIS 18CM MED (MISCELLANEOUS)
SCISSORS LAP 5X35 DISP (ENDOMECHANICALS) ×3 IMPLANT
SEAL CANN UNIV 5-8 DVNC XI (MISCELLANEOUS) ×8 IMPLANT
SEAL XI 5MM-8MM UNIVERSAL (MISCELLANEOUS) ×4
SEALER VESSEL DA VINCI XI (MISCELLANEOUS) ×1
SEALER VESSEL EXT DVNC XI (MISCELLANEOUS) ×2 IMPLANT
SLEEVE ADV FIXATION 5X100MM (TROCAR) IMPLANT
SOLUTION ELECTROLUBE (MISCELLANEOUS) ×3 IMPLANT
SPONGE GAUZE 2X2 STER 10/PKG (GAUZE/BANDAGES/DRESSINGS) ×1
STAPLER 45 BLU RELOAD XI (STAPLE) IMPLANT
STAPLER 45 BLUE RELOAD XI (STAPLE)
STAPLER 45 GREEN RELOAD XI (STAPLE)
STAPLER 45 GRN RELOAD XI (STAPLE) IMPLANT
STAPLER CANNULA SEAL DVNC XI (STAPLE) ×2 IMPLANT
STAPLER CANNULA SEAL XI (STAPLE) ×1
STAPLER ECHELON POWER CIR 31 (STAPLE) ×1 IMPLANT
STAPLER SHEATH (SHEATH) ×1
STAPLER SHEATH ENDOWRIST DVNC (SHEATH) ×2 IMPLANT
SURGILUBE 2OZ TUBE FLIPTOP (MISCELLANEOUS) ×3 IMPLANT
SUT MNCRL AB 4-0 PS2 18 (SUTURE) ×3 IMPLANT
SUT PDS AB 1 CTX 36 (SUTURE) IMPLANT
SUT PDS AB 1 TP1 96 (SUTURE) ×2 IMPLANT
SUT PROLENE 0 CT 2 (SUTURE) IMPLANT
SUT PROLENE 2 0 KS (SUTURE) ×1 IMPLANT
SUT PROLENE 2 0 SH DA (SUTURE) IMPLANT
SUT SILK 2 0 (SUTURE)
SUT SILK 2 0 SH CR/8 (SUTURE) IMPLANT
SUT SILK 2-0 18XBRD TIE 12 (SUTURE) IMPLANT
SUT SILK 3 0 (SUTURE) ×3
SUT SILK 3 0 SH CR/8 (SUTURE) ×3 IMPLANT
SUT SILK 3-0 18XBRD TIE 12 (SUTURE) ×2 IMPLANT
SUT V-LOC BARB 180 2/0GR6 GS22 (SUTURE)
SUT VIC AB 2-0 SH 18 (SUTURE) ×1 IMPLANT
SUT VIC AB 3-0 SH 18 (SUTURE) IMPLANT
SUT VIC AB 3-0 SH 27 (SUTURE)
SUT VIC AB 3-0 SH 27XBRD (SUTURE) IMPLANT
SUT VICRYL 0 UR6 27IN ABS (SUTURE) ×3 IMPLANT
SUTURE V-LC BRB 180 2/0GR6GS22 (SUTURE) IMPLANT
SYR 10ML LL (SYRINGE) ×3 IMPLANT
SYS LAPSCP GELPORT 120MM (MISCELLANEOUS)
SYSTEM LAPSCP GELPORT 120MM (MISCELLANEOUS) IMPLANT
TAPE UMBILICAL COTTON 1/8X30 (MISCELLANEOUS) ×3 IMPLANT
TOWEL OR NON WOVEN STRL DISP B (DISPOSABLE) ×3 IMPLANT
TRAY FOLEY MTR SLVR 16FR STAT (SET/KITS/TRAYS/PACK) ×3 IMPLANT
TROCAR ADV FIXATION 5X100MM (TROCAR) ×3 IMPLANT
TUBING CONNECTING 10 (TUBING) ×6 IMPLANT
TUBING INSUFFLATION 10FT LAP (TUBING) ×3 IMPLANT

## 2018-06-12 NOTE — Transfer of Care (Signed)
Immediate Anesthesia Transfer of Care Note  Patient: Dave Vaughn  Procedure(s) Performed: XI ROBOTIC ASSISTED PARTIAL COLECTOMY WITH  COLOSTOMY TAKEDOWN (N/A Abdomen) LYSIS OF ADHESIONS (N/A Abdomen) RIGID PROCTOSCOPY (N/A Rectum)  Patient Location: PACU  Anesthesia Type:General  Level of Consciousness: awake, alert , oriented and patient cooperative  Airway & Oxygen Therapy: Patient Spontanous Breathing and Patient connected to face mask oxygen  Post-op Assessment: Report given to RN, Post -op Vital signs reviewed and stable and Patient moving all extremities  Post vital signs: Reviewed and stable  Last Vitals:  Vitals Value Taken Time  BP 115/67 06/12/2018 12:06 PM  Temp    Pulse 53 06/12/2018 12:08 PM  Resp 12 06/12/2018 12:08 PM  SpO2 99 % 06/12/2018 12:08 PM  Vitals shown include unvalidated device data.  Last Pain:  Vitals:   06/12/18 0610  TempSrc:   PainSc: 0-No pain         Complications: No apparent anesthesia complications

## 2018-06-12 NOTE — H&P (Signed)
Dave Vaughn Documented: 05/08/2018 4:17 PM Location: [facility] Patient #: [id number] DOB: Mar 23, 1946 Married / Language: Dave Vaughn / Race: White Male  Patient Care Team: Dave Macadam, MD as PCP - General (Family Medicine) Dave Forward, MD as Consulting Physician (Cardiology) Dave Artist, MD as Consulting Physician (Gastroenterology) Dave Boston, MD as Consulting Physician (General Surgery) Dave Skates, MD as Consulting Physician (General Surgery) Dave Mons, MD as Consulting Physician (Urology) Dave Merle, MD as Consulting Physician (Medical Oncology)  [name] The patient returns s/p [facility] resection for obstructing colon cancer 01/11/2018.      The patient returns to clinic after surgery, gradually improving. Pain from the incisions is fading away. His midline wound is nearly closed down. He is emptying his colostomy bag about 3 times a day. He is walking 2 miles a day. He is eating better. He is regaining some weight. He is urinating fine on Flomax. No new urinary issues. Due to follow up with him in 6 months.  He had increasing cold intolerance and neuropathies on oxaliplatin and wished that to be held. He is just on Xeloda-based chemotherapy. I believe he's near the end of his chemotherapy. They think they have only 2 more cycles to go. Denies nausea, constipation/diarrhea, worsening fatigue, high fevers, or other concerns. They are very interested in proceeding with colostomy takedown as soon as it is Vaughn. Patient referred to me given my expertise in minimally invasive approaches.  No new events.  Ready for surgery  ` Prior Note:  This is a very pleasant 73 year old man presenting over 3 months postop emergency colon resection for obstructing colon cancer. His wife is with him today. Dr. Burr Vaughn is his oncologist. Dr. Ailene Vaughn is his cardiologist. Dr. [name] Vaughn is his gastroenterologist. Dr. Ethlyn Vaughn is his  PCP. Dr. Lovena Vaughn is his urologist.  He presented on January 11, 2018 with abdominal distention and vomiting and new onset atrial fibrillation. CT scan showed a mass in the proximal descending colon with proximal obstruction. Sigmoidoscopy by Dr. Lyndel Vaughn showed an apple core lesion at 50 cm. He immediately underwent laparotomy, left colon resection, takedown splenic flexure, end colostomy. 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 13 lymph nodes were negative but there was a positive mesenteric peritoneal deposit.. Final staging is T3, N1c, stage IIIB. I placed a Dave Vaughn on March 07, 2018.  He is doing well. His midline wound is still granulating in but it is very shallow. Dr. Burr Vaughn has been giving him chemotherapy. He had a lot of side effects after the third round. He scheduled for chemotherapy again on November 25. They have not decided have any more rounds of chemotherapy he will get that that will be decided based on his tolerance. He remains in atrial fibrillation but has not been converted yet. He is on Eliquis.. Dr. [name] Vaughn plans completion colonoscopy on December 4. He had problems with urinary retention requiring Foley catheter. He saw Dr. Lovena Vaughn. He was started on Flomax and that has mostly resolved.  Exam today was good. Midline wound has shallow granulation tissue but it is healing in and I expect to have healing by secondary intention bilaterally January. He is not quite ready for colostomy closure. He needs to have his completion colonoscopy, he needs for his wound to heal, he needs to have his cardiac issues addressed, and we need to decide how much more chemotherapy he is going to receive.  He is referred to his cardiologist for  cardiac risk assessment and cardiac clearance for general anesthesia for colostomy closure Hopefully his atrial fibrillation can be converted by then Completion colonoscopy December 4, Dr.  [name] Vaughn We can take his Dave Vaughn out once Dr. Burr Vaughn is through with chemotherapy  In terms of technical aspects for colostomy closure, I advised him to have this done robotically. Minimal access will theoretically have less pain and quicker recovery and he agrees he will be referred to one of our robotic colorectal surgeons I will standby   Pathology: Pathology Results Diagnosis 01/11/18 1. Colon, segmental resection for tumor, descending colon - INVASIVE COLORECTAL ADENOCARCINOMA, 4 CM. - TUMOR EXTENDS INTO PERICOLONIC CONNECTIVE TISSUE. - TUMOR FOCALLY INVOLVES RADIAL MARGIN. - ONE MESENTERIC TUMOR DEPOSIT. - THIRTEEN BENIGN LYMPH NODES (0/13). 2. Colon, segmental resection, splenic flexure - BENIGN COLON. - NO EVIDENCE OF MALIGNANCY .  Patient Name: Dave Vaughn   Date of Surgery: 01/11/2018  Pre op Diagnosis: Obstruction descending colon secondary to neoplastic mass  Post op Diagnosis: Same  Procedure: Exploratory laparotomy, left colectomy, takedown splenic flexure, end colostomy  Surgeon: Dave Vaughn. Dave Vaughn, M.D., FACS  Assistant: Dave Billet, PA  Indication for Assistant: Obese patient with massive intestinal distention. Assistant indicated to assist with exposure to facilitate Vaughn procedure  Operative Indications: This is a reasonably healthy 73 year old man who has a 3 to 4-week history of gradually developing obstructive symptoms. Most recently he became distended and began vomiting. Emergency room evaluation revealed new onset atrial fibrillation which is being managed by the medical service. CT scan showed a small mass in the mid to proximal descending colon with massive colonic and small bowel dilatation. Nasogastric tube was placed. Flexible sigmoidoscopy was performed by Dr. Lyndel Vaughn this morning and he found an apple core neoplastic mass at about 50 cm. This was tattooed. He was  immediately operated upon following the colonoscopy  Operative Findings: There was an obstructing neoplastic mass in the mid to proximal descending colon. There was some chronic diverticulosis of the rectosigmoid. The tattoo was just distal to the palpable mass. There was no Suhayla Chisom evidence of adenopathy or spread to the liver. During the dissection the tumor fractured slightly and a little bit of light tan-colored stool spilled out which was immediately evacuated and washed out thoroughly. I transected the colon at least 6 or 7 inches distal to the tumor. When I transected the colon proximally it only looked like about 3 cm so I took a second specimen that was about 5 cm in length and marked a new proximal margin with a silk suture. The distal rectosigmoid segment that was stapled off was probably at least 12 inches above the peritoneal reflection. The sigmoid stump was marked with 2 Prolene sutures.  Procedure in Detail: Following the induction of general endotracheal Anesthesia Dr. Lyndel Vaughn performed colonoscopy which will be dictated separately. After the colonoscopy the patient was placed on the operating table. His abdomen was prepped and draped in sterile fashion. Intravenous antibiotics were given. Surgical timeout was performed. Midline laparotomy incision was made. The fascia was incised in the midline. Self-retaining retractors were placed. The abdomen was explored with findings as described above. The nasogastric tube was positioned in the stomach. Because of the massive distention I milked all the small bowel content back into the stomach and it was evacuated. This helped with exposure somewhat. I mobilized the entire descending colon by dividing its lateral peritoneal attachments. Very carefully mobilized it up toward the midline. I created a window in the colon mesentery about  7 inches distal to the tumor. The colon was transected at this point with a GIA stapling  device. I mobilized the descending colon and splenic flexure by taking down the lateral peritoneal attachments. Small blood vessels were controlled with the LigaSure device. One large blood vessel was controlled with a 2-0 silk tie. Once I took the splenic flexure down I went all the way back to the transverse colon. I transected the colon proximal to the tumor and marked the proximal margin. When I inspected the specimen it only looked like I had about 3 cm proximal margin. I then resected about another 5 cm of left transverse colon and marked a new proximal margin with a silk suture.  The spleen, pancreas, and stomach were inspected and looked good. There was no injury. I selected an area for the colostomy in the left rectus sheath and this needed to be above the umbilicus but was a good 6 inches below the costal margin. A circular button of skin was excised. The subcutaneous fat was debrided. The anterior rectus sheath was incised in a cruciate fashion. The rectus muscle was spread and the posterior rectus sheath incised with the cautery until I could pass almost 3 fingers through this. The distended transverse colon was brought up through the colostomy site and I was able to pull up about 3 or 4 inches of very pink and viable colon through the colostomy. I inspected this carefully on 2 occasions to make sure there was no twisting. The upper abdomen lower abdomen and pelvis were irrigated with saline. There was no bleeding. 3 or 4 L were used to irrigate. The NG tube was repositioned and taped in place. The midline fascia was closed with a running suture of #1 double-stranded PDS. This wound was irrigated and the skin closed loosely with staples and Telfa wicks. Honeycomb bandage was placed. The staple line on the colostomy was amputated revealing healthy pink mucosa and excellent bleeding. The colostomy was matured with numerous sutures of 3-0 Vicryl. A colostomy appliance was  placed. The patient tolerated the procedure well was taken to PACU in stable condition. EBL 150 cc. Counts correct. Complications none.     Dave Vaughn. Dave Vaughn, M.D., FACS General and Minimally Invasive Surgery Breast and Colorectal Surgery  01/11/2018 4:23 PM   Problem List/Past Medical [name], MD; 05/08/2018 4:33 PM) CANCER OF DESCENDING COLON (C18.6) STATUS POST COLOSTOMY, FOLLOW-UP EXAM (Z09) WOUND INFECTION AFTER SURGERY (T81.49XA) ATRIAL FIBRILLATION, NEW ONSET (I48.91) ANTICOAGULATED (Z79.01) URINARY RETENTION (R33.9)  Past Surgical History [name], MD; 05/08/2018 4:33 PM) Colon Removal - Partial Resection of Small Bowel  Diagnostic Studies History [name], MD; 05/08/2018 4:33 PM) Colonoscopy within last year  Allergies [name], MD; 05/08/2018 4:33 PM) No Known Drug Allergies [01/26/2018]: Allergies Reconciled  Medication History (Armen Ferguson, CMA; 05/08/2018 4:18 PM) Tamsulosin HCl (0.4MG Capsule, Oral) Active. Ondansetron HCl (8MG Tablet, Oral) Active. Amiodarone HCl (200MG Tablet, Oral) Active. DilTIAZem HCl ER Coated Beads (240MG Capsule ER 24HR, Oral) Active. Eliquis (5MG Tablet, Oral) Active. Tylenol Extra Strength (500MG Tablet, Oral as needed) Active. Medications Reconciled  Social History [name], MD; 05/08/2018 4:33 PM) Alcohol use Occasional alcohol use. Caffeine use Tea. No drug use Tobacco use Never smoker.  Family History [name], MD; 05/08/2018 4:33 PM) Breast Cancer Mother. Colon Polyps Daughter. Melanoma Daughter.  Other Problems [name], MD; 05/08/2018 4:33 PM) Atrial Fibrillation Colon Cancer Diverticulosis    Vitals (Armen Glo Herring CMA;  05/08/2018 4:18 PM) 05/08/2018 4:18 PM Weight: 220 lb Height: 70in Body Surface Area: 2.17 m Body Mass Index: 31.57 kg/m  Temp.: 98.76F  Pulse: 74 (Regular)  P.OX: 98% (Room  air) BP: 118/78 (Sitting, Left Arm, Standard)      Physical Exam [name] MD; 05/08/2018 5:52 PM)  General Mental Status-Alert. General Appearance-Not in acute distress, Not Sickly. Orientation-Oriented X3. Build & Nutrition-Well nourished. Posture-Normal posture. Gait-Normal. Hydration-Well hydrated. Voice-Normal.  Integumentary Global Assessment Upon inspection and palpation of skin surfaces of the - Axillae: non-tender, no inflammation or ulceration, no drainage. and Distribution of scalp and body hair is normal. General Characteristics Temperature - normal warmth is noted.  Head and Neck Head-normocephalic, atraumatic with no lesions or palpable masses. Face Global Assessment - atraumatic, no absence of expression. Neck Global Assessment - no abnormal movements, no bruit auscultated on the right, no bruit auscultated on the left, no decreased range of motion, non-tender. Trachea-midline. Thyroid Gland Characteristics - normal size and consistency, no palpable nodules, non-tender.  Eye Eyeball - Left-Extraocular movements intact, No Nystagmus. Eyeball - Right-Extraocular movements intact, No Nystagmus. Cornea - Left-No Hazy. Cornea - Right-No Hazy. Sclera/Conjunctiva - Left-No scleral icterus, No Discharge. Sclera/Conjunctiva - Right-No scleral icterus, No Discharge. Pupil - Left-Direct reaction to light normal. Pupil - Right-Direct reaction to light normal.  ENMT Ears Pinna - Left - no drainage observed, no generalized tenderness observed. Right - no drainage observed, no generalized tenderness observed. Nose and Sinuses External Inspection of the Nose - no destructive lesion observed. Inspection of the nares - Left - quiet respiration. Right - quiet respiration. Mouth and Throat Lips - Upper Lip - no fissures observed, no pallor noted. Lower Lip - no fissures observed, no pallor noted. Nasopharynx - no  discharge present. Oral Cavity/Oropharynx - Tongue - no dryness observed. Oral Mucosa - no cyanosis observed. Hypopharynx - no evidence of airway distress observed.  Chest and Lung Exam Inspection Movements - Normal and Symmetrical. Accessory muscles - No use of accessory muscles in breathing. Palpation Palpation of the chest reveals - Non-tender. Auscultation Breath sounds - Normal and Clear. Note: Lungs clear to auscultation bilaterally. No wheezes or rales or rhonchi. Port right infraclavicular area looks good  Cardiovascular Auscultation Rhythm - Regular. Murmurs & Other Heart Sounds - Auscultation of the heart reveals - No Murmurs and No Systolic Clicks. Note: Irregularly irregular rhythm. No murmur.   Abdomen Inspection Inspection of the abdomen reveals - No Visible peristalsis and No Abnormal pulsations. Umbilicus - No Bleeding, No Urine drainage. Palpation/Percussion Palpation and Percussion of the abdomen reveal - Soft, Non Tender, No Rebound tenderness, No Rigidity (guarding) and No Cutaneous hyperesthesia. Note: Abdomen obese but soft. Left upper quadrant colostomy in place without obvious hernia.  Midline wound 50 x 15 mm superficial and granulating. No obvious hernia. No pain or tenderness.  Male Genitourinary Sexual Maturity Tanner 5 - Adult hair pattern and Adult penile size and shape. Note: No inguinal lymphadenopathy. No inguinal hernias  Peripheral Vascular Upper Extremity Inspection - Left - No Cyanotic nailbeds, Not Ischemic. Right - No Cyanotic nailbeds, Not Ischemic.  Neurologic Neurologic evaluation reveals -normal attention span and ability to concentrate, able to name objects and repeat phrases. Appropriate fund of knowledge , normal sensation and normal coordination. Mental Status Affect - not angry, not paranoid. Cranial Nerves-Normal Bilaterally. Gait-Normal.  Neuropsychiatric Mental status exam performed with findings  of-able to articulate well with normal speech/language, rate, volume and coherence, thought content normal with ability to  perform basic computations and apply abstract reasoning and no evidence of hallucinations, delusions, obsessions or homicidal/suicidal ideation.  Musculoskeletal Global Assessment Spine, Ribs and Pelvis - no instability, subluxation or laxity. Right Upper Extremity - no instability, subluxation or laxity. Normal Exam - Bilateral-Upper Extremity Strength Normal and Lower Extremity Strength Normal.  Lymphatic Head & Neck  General Head & Neck Lymphatics: Bilateral - Description - No Localized lymphadenopathy. Axillary  General Axillary Region: Bilateral - Description - No Localized lymphadenopathy. Femoral & Inguinal  Generalized Femoral & Inguinal Lymphatics: Left - Description - No Localized lymphadenopathy. Right - Description - No Localized lymphadenopathy.    Assessment & Vaughn [name] MD; 05/08/2018 5:50 PM)  CANCER OF DESCENDING COLON (C18.6) Story: Cancer of left colon,adenocarcinoma, stage IIIB(pT3N1cM0), MSI-stable  Open colectomy/colostomy Hartmann resection 01/11/2017.  Chemotherapy starting 03/07/2018 Impression: Recovering status post Hartmann resection for obstructing T3 N1 colon cancer.  Continue wound care. Incision nearly closed.  Colostomy care.  Completion colonoscopy later this week by Dr. [name] Vaughn.  Cardiac clearance by Dr. [name]. History of atrial fibrillation stable on amiodarone, diltiazem, Eliquis anticoagulation. Make sure it Vaughn to come off Eliquis for surgery. Patient walk several miles a day, a hopeful sign he is recovering and active. I'm assuming since he tolerated the urgent open surgery, minimally invasive takedown should be less morbid at risk for him.  Vaughn colostomy takedown once he is completed chemotherapy. I believe is an cycle 10 of 12. Most likely will finish within the next month. I will check  this specific and time with Dr. Burr Vaughn Tentative Vaughn colostomy takedown   Current Plans The anatomy & physiology of the digestive tract was discussed. The pathophysiology of the colon was discussed. Natural history risks without surgery was discussed. I feel the risks of no intervention will lead to serious problems that outweigh the operative risks; therefore, I recommended a partial colectomy to remove the pathology. Minimally invasive (Robotic/Laparoscopic) & open techniques were discussed.  Risks such as bleeding, infection, abscess, leak, reoperation, possible ostomy, hernia, heart attack, death, and other risks were discussed. I noted a good likelihood this will help address the problem. Goals of post-operative recovery were discussed as well. Need for adequate nutrition, daily bowel regimen and healthy physical activity, to optimize recovery was noted as well. We will work to minimize complications. Educational materials were available as well. Questions were answered. The patient expresses understanding & wishes to proceed with surgery.   Current Plans Pt Education - CCS Ostomy HCI (Arbor Cohen): discussed with patient and provided information.  ATRIAL FIBRILLATION, NEW ONSET (I48.91) Impression: Has not converted yet. On Eliquis   ANTICOAGULATED (Z79.01)  [name], MD, FACS, MASCRS Gastrointestinal and Minimally Invasive Surgery    [address] [place], [place] [address] [phone] Main / Paging [phone] Fax

## 2018-06-12 NOTE — Anesthesia Preprocedure Evaluation (Addendum)
Anesthesia Evaluation  Patient identified by MRN, date of birth, ID band Patient awake    Reviewed: Allergy & Precautions, NPO status , Patient's Chart, lab work & pertinent test results  Airway Mallampati: III  TM Distance: >3 FB Neck ROM: Full    Dental no notable dental hx.    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  Reproductive/Obstetrics                            Anesthesia Physical Anesthesia Plan  ASA: III  Anesthesia Plan: General   Post-op Pain Management:    Induction: Intravenous  PONV Risk Score and Plan: 3 and Ondansetron, Dexamethasone, Midazolam and Treatment may vary due to age or medical condition  Airway Management Planned: Oral ETT  Additional Equipment:   Intra-op Plan:   Post-operative Plan: Extubation in OR  Informed Consent: I have reviewed the patients History and Physical, chart, labs and discussed the procedure including the risks, benefits and alternatives for the proposed anesthesia with the patient or authorized representative who has indicated his/her understanding and acceptance.   Dental advisory given  Plan Discussed with: CRNA  Anesthesia Plan Comments:         Anesthesia Quick Evaluation

## 2018-06-12 NOTE — Anesthesia Postprocedure Evaluation (Signed)
Anesthesia Post Note  Patient: Dave Vaughn  Procedure(s) Performed: XI ROBOTIC ASSISTED PARTIAL COLECTOMY WITH  COLOSTOMY TAKEDOWN (N/A Abdomen) LYSIS OF ADHESIONS (N/A Abdomen) RIGID PROCTOSCOPY (N/A Rectum)     Patient location during evaluation: PACU Anesthesia Type: General Level of consciousness: awake and alert Pain management: pain level controlled Vital Signs Assessment: post-procedure vital signs reviewed and stable Respiratory status: spontaneous breathing, nonlabored ventilation, respiratory function stable and patient connected to nasal cannula oxygen Cardiovascular status: blood pressure returned to baseline and stable Postop Assessment: no apparent nausea or vomiting Anesthetic complications: no    Last Vitals:  Vitals:   06/12/18 1500 06/12/18 1543  BP: 113/74 136/78  Pulse: (!) 115 87  Resp: 16 16  Temp: 36.4 C (!) 36.4 C  SpO2: 100% 95%    Last Pain:  Vitals:   06/12/18 1500  TempSrc:   PainSc: 2                  Ryan P Ellender

## 2018-06-12 NOTE — Op Note (Signed)
06/12/2018  11:52 AM  PATIENT:  Dave Vaughn  73 y.o. 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  Desire for colostomy takedown  PROCEDURE:  XI ROBOTIC ASSISTED PARTIAL COLECTOMY COLOSTOMY TAKEDOWN LYSIS OF ADHESIONS x 2 hours (1/2 case) ENTERORRHAPHY X1 RIGID PROCTOSCOPY  SURGEON:  [name], MD  ASSISTANT: Gurney Maxin, MD   ANESTHESIA:   local and general  EBL:  Total I/O In: 1250 [I.V.:1000; IV Piggyback:250] Out: 100 [Urine:50; Blood:50]  Delay start of Pharmacological VTE agent (>24hrs) due to surgical blood loss or risk of bleeding:  no  DRAINS: none   SPECIMEN:   1 & 2.  Anastomotic rings (blue stitch in proximal) 3.  Colostomy 4.  Distal 1/2 of sigmoid colon   DISPOSITION OF SPECIMEN:  PATHOLOGY  COUNTS:  YES  PLAN OF CARE: Admit to inpatient   PATIENT DISPOSITION:  PACU - hemodynamically stable.  INDICATION: Pleasant patient status post colectomy with end ostomy for obstructing left-sided colon cancer by Dr. [name] this past summer.  PT3pN1c.  Underwent post adjuvant chemotherapy.  No evidence of distant disease.  The patient has recovered and has understandably requested ostomy takedown.  Medically stabilized and felt reasonable to proceed.   I discussed the procedure with the patient:  The anatomy & physiology of the digestive tract was discussed.   The pathophysiology was discussed.  Possibility of remaining with an ostomy permanently was discussed.  I offered ostomy takedown.  Laparoscopic & open techniques were discussed.   Risks such as bleeding, infection, abscess, leak, reoperation, possible re-ostomy, injury to other organs, hernia, heart attack, death, and other risks were discussed.   I noted a good likelihood this will help address the problem.  Goals of post-operative recovery were discussed as well.  We will work to minimize complications.  Questions were answered.  The patient expresses understanding & wishes to proceed with surgery.  OR FINDINGS: Moderate adhesions of greater omentum and small bowel to the anterior abdominal wall.  Very dense adhesions of small intestine to the left posterior retroperitoneum especially over Gerota's fascia with a small purulent pocket suspicion for chronic abscess.  No strong evidence of metastatic disease.  Distal sigmoid colon draining with some diverticulitis and inflammation.  Sected down to the rectosigmoid junction.  It is a 31 EEA Ethicon powered stapled anastomosis.  The anastomosis sets at 15 cm from the anal verge.  DESCRIPTION:   Informed consent was confirmed.   The patient received IV antibiotics & underwent general anesthesia without any difficulty.  Foley catheter was sterilely placed. SCDs were active during the entire case.  I sutured the stomy closed using a pursestring  stitch.  The abdomen was prepped and draped in a sterile fashion.  A surgical timeout confirmed our plan.  Informed consent was confirmed.  The patient underwent general anaesthesia without difficulty.  The patient was positioned appropriately.  VTE prevention in place.  The patient's abdomen was clipped, prepped, & draped in a sterile fashion.  Surgical timeout confirmed our plan.  Peritoneal entry with a laparoscopic port was obtained using Varess spring needle entry technique in the left upper abdomen as the  patient was positioned in reverse Trendelenburg.  Initially placed in the right upper quadrant but could not get definite insufflation.  Therefore switched to the left side.  I induced carbon dioxide insufflation.  No change in end tidal CO2 measurements.  Full symmetrical abdominal distention.  Initial port was carefully placed.  Camera inspection revealed no injury to either upper quadrant..  Extra ports were carefully placed under direct laparoscopic visualization.  Proceeded with lysis of adhesions to free moderately dense but wispy adhesions anterior abdominal wall of greater omentum on the left side and small intestine along the midline.  Freed off some interloop adhesions as well.  Able to identify the mid descending colon going up into the left lower quadrant colostomy.  There was a parastomal hernia associated with some greater omentum but not much else.  Freed that off circumferentially above the level of the anterior to's fascia into the subcutaneous hernia sac.  I focused down to the pelvis.  Did identify the mid/distal sigmoid colon with a Prolene stitch along the left lower quadrant retroperitoneum.  Freed off some interloop adhesions of small bowel.  And there was still a large volume of small intense and densely adherent to the left retroperitoneum.  I went medial lateral and then lateral to medial to help mobilize distal sigmoid colon off the retroperitoneum.  Identified and preserve the left ureter and its retroperitoneal state.  Without better identified I gradually freed off loops of small bowel off the retroperitoneum.  Encountered a pocket of white milky fluid suspicious for a chronic small abscess.  There was some granulation tissue associated with this.  Seem more consistent with inflammation and not metastasis.  Eventually freed all the small intestine off the left retroperitoneum inferior to the colostomy.  Freed the greater omentum off the left colon and splenic flexure.  Felt like  there was enough mobility so I did not do a more formal splenic flexure mobilization.  I transected the sigmoid colon mesentery close to its takeoff from the inferior mesenteric artery to the rectosigmoid junction at the level of the promontory.  Transect mesentery with vessel sealer.  Dr. Kieth Brightly placed a staple port through the inferior part of the left-sided colostomy.  I stapled off at the rectosigmoid junction with a single firing of a green load robotic 45 mm stapler.  He has had a very small contained questionable abscess that was far away from the planned anastomosis, I felt it was still safe to do anastomosis today.  Dr. Kieth Brightly agreed.  We undocked the robot.  I made a biconcave curvilinear incision transversely around the ostomy.  I got into the subcutaneous tissues.  I used careful focused right angle dissection and sharp dissection.  Some focused cautery dissection as well.  That helped to free adhesions to the subcutaneous tisses & fascia.  I was able to enter into the peritoneum focally.  I did a gentle finger sweep.  Gradually came around circumferentially and freed the bowel from remaining adhesions to the abdominal wall.  We were able eviscerate some bowel proximally and distally.  I removed the resected sigmoid colon.  I clamped the colon at the point of resection using a reusable pursestringer device.  Passed a 2-0 Keith needle. I transected at the descending/sigmoid junction with a scalpel. I got healthy bleeding mucosa.  We sent the rectosigmoid colon specimen off to  go to pathology.  We sized the colon orifice.  I chose a 31 Ethicon powered EEA anvil stapler system.  I reinforced the prolene pursestring with interrupted silk suture.  I placed the anvil to the open end of the proximal remaining colon and closed around it using the pursestring.    We did copious irrigation with crystalloid solution.  Hemostasis was good.  The distal end of the colon at the handle easily reached down to the  rectal stump, therefore, splenic flexure mobilization was not needed.  Ran the small bowel and saw a small enterotomy on the mesenteric side very close to where that abscess pocket was.  This was repaired transversely with interrupted 3-0 silk sutures.  Again ran the small bowel from proximal jejunum ligament of Treitz to the more distal ileum that had gentle adhesions and saw no other abnormalities.      Dr [name] Plan scrubbed down and did gentle anal dilation and advanced the EEA stapler up the rectal stump. The spike was brought out at the provimal end of the rectal stump under direct visualization.  I attached the anvil of the proximal colon the spike of the stapler. Anvil was tightened down and held clamped for 60 seconds. The EEA stapler was fired and held clamped for 30 seconds. The stapler was released & removed. We noted 2 excellent anastomotic rings. Blue stitch is in the proximal ring.  Dr Kieth Brightly did rigid proctoscopy noted the anastomosis was at 15 cm from the anal verge consistent with the proximal rectum.  We did a final irrigation of antibiotic solution (900 mg clindamycin/240 mg gentamicin in a liter of crystalloid) & held that for the pelvic air leak test.  The rectum was insufflated the rectum while clamping the colon proximal to that anastomosis.  There was a negative air leak test. There was no tension of mesentery or bowel at the anastomosis.   Tissues looked viable.  Hemostasis was good.   Ureters & bowel uninjured.  The anastomosis looked healthy.   We changed gown and gloves.  The patient was re-draped.  Sterile unused instruments were used from this point out per colon SSI prevention protocol.  I removed CO2 gas out through the ports.  I closed the 76mm port sites using Monocryl stitch and sterile dressing.  I closed the abdominal wall ostomy wound using #1 PDS transverse fascial closure. I closed the skin with some interrupted Monocryl stitches. I placed antibiotic-soaked wicks into the  closure at the corners x2. I placed a sterile dressing.    Patient is being extubated go to recovery room. I discussed postop care with the patient in detail the office & with the patient and his wife again in the holding area. Instructions are written. I discussed operative findings, updated the patient's status, discussed probable steps to recovery, and gave postoperative recommendations to the patient's spouse.  Recommendations were made.  Questions were answered.  She expressed understanding & appreciation.    [name], M.D., F.A.C.S. Gastrointestinal and Minimally Invasive Surgery [facility] [address], [place], [place] [address] [phone] Main / Paging

## 2018-06-12 NOTE — Anesthesia Procedure Notes (Signed)
Procedure Name: Intubation Date/Time: 06/12/2018 7:44 AM Performed by: Mitzie Na, CRNA Pre-anesthesia Checklist: Patient identified, Emergency Drugs available, Suction available, Patient being monitored and Timeout performed Patient Re-evaluated:Patient Re-evaluated prior to induction Oxygen Delivery Method: Circle system utilized Preoxygenation: Pre-oxygenation with 100% oxygen Induction Type: IV induction Ventilation: Mask ventilation without difficulty and Oral airway inserted - appropriate to patient size Laryngoscope Size: Glidescope and 4 Grade View: Grade I Tube type: Oral Tube size: 7.5 mm Number of attempts: 1 Airway Equipment and Method: Video-laryngoscopy Placement Confirmation: positive ETCO2,  breath sounds checked- equal and bilateral and ETT inserted through vocal cords under direct vision Secured at: 21 cm Tube secured with: Tape Dental Injury: Teeth and Oropharynx as per pre-operative assessment

## 2018-06-13 ENCOUNTER — Encounter (HOSPITAL_COMMUNITY): Payer: Self-pay | Admitting: Surgery

## 2018-06-13 DIAGNOSIS — N183 Chronic kidney disease, stage 3 unspecified: Secondary | ICD-10-CM

## 2018-06-13 LAB — CBC
HCT: 37.8 % — ABNORMAL LOW (ref 39.0–52.0)
Hemoglobin: 12.1 g/dL — ABNORMAL LOW (ref 13.0–17.0)
MCH: 30.6 pg (ref 26.0–34.0)
MCHC: 32 g/dL (ref 30.0–36.0)
MCV: 95.5 fL (ref 80.0–100.0)
Platelets: 199 10*3/uL (ref 150–400)
RBC: 3.96 MIL/uL — ABNORMAL LOW (ref 4.22–5.81)
RDW: 15.9 % — AB (ref 11.5–15.5)
WBC: 14.5 10*3/uL — ABNORMAL HIGH (ref 4.0–10.5)
nRBC: 0 % (ref 0.0–0.2)

## 2018-06-13 LAB — BASIC METABOLIC PANEL
Anion gap: 9 (ref 5–15)
BUN: 18 mg/dL (ref 8–23)
CO2: 26 mmol/L (ref 22–32)
Calcium: 8.8 mg/dL — ABNORMAL LOW (ref 8.9–10.3)
Chloride: 104 mmol/L (ref 98–111)
Creatinine, Ser: 1.49 mg/dL — ABNORMAL HIGH (ref 0.61–1.24)
GFR calc Af Amer: 54 mL/min — ABNORMAL LOW (ref >60)
GFR calc non Af Amer: 47 mL/min — ABNORMAL LOW (ref >60)
Glucose, Bld: 136 mg/dL — ABNORMAL HIGH (ref 70–99)
Potassium: 4.8 mmol/L (ref 3.5–5.1)
SODIUM: 139 mmol/L (ref 135–145)

## 2018-06-13 LAB — MAGNESIUM: Magnesium: 1.7 mg/dL (ref 1.7–2.4)

## 2018-06-13 MED ORDER — MAGNESIUM SULFATE 2 GM/50ML IV SOLN
2.0000 g | Freq: Once | INTRAVENOUS | Status: AC
  Administered 2018-06-13: 2 g via INTRAVENOUS
  Filled 2018-06-13: qty 50

## 2018-06-13 MED ORDER — DILTIAZEM HCL ER COATED BEADS 120 MG PO CP24
120.0000 mg | ORAL_CAPSULE | Freq: Once | ORAL | Status: AC
  Administered 2018-06-13: 120 mg via ORAL
  Filled 2018-06-13: qty 1

## 2018-06-13 NOTE — Progress Notes (Signed)
Patient has refused ambulation in halls today due to c/o pain and nausea. Medicated for both throughout the day. Donne Hazel, RN

## 2018-06-13 NOTE — Progress Notes (Signed)
Dave Vaughn [id number] 01/03/1946  CARE TEAM:  PCP: [name], MD  Outpatient Care Team: Patient Care Team: [name], MD as PCP - General (Family Medicine) [name], MD as Consulting Physician (Cardiology) [name], MD as Consulting Physician (Gastroenterology) [name], MD as Consulting Physician (General Surgery) [name], MD as Consulting Physician (General Surgery) [name], MD as Consulting Physician (Urology) [name], MD as Consulting Physician (Medical Oncology)  Inpatient Treatment Team: Treatment Team: Attending Provider: [name], MD; Technician: Leda Quail, NT   Problem List:   Principal Problem:   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  Desire for colostomy takedown  PROCEDURE:  XI ROBOTIC ASSISTED PARTIAL COLECTOMY COLOSTOMY TAKEDOWN LYSIS OF ADHESIONS x 2 hours (1/2 case) ENTERORRHAPHY X1 RIGID PROCTOSCOPY  SURGEON:  [name], MD  Assessment  Recovering okay  [facility] Stay = 1 days)  Plan:  -Dysphagia 1/full liquid diet.  Advance to soft diet as tolerated.  Stop IV fluids.    Chronic kidney disease stable with decent urinary output and no major fluctuation in baseline creatinine.  Continue the ERAS protocol.  Already ambulating in the hallways as of last night a good sign . VTE prophylaxis- SCDs, etc  Hold anticoagulation until postoperative day due.  Most likely can restart tomorrow if hemoglobin remains stable.  Borderline magnesium.  We will replace.  Continue amiodarone etc. for chronic fibrillation control  mobilize as  tolerated to help recovery  20 minutes spent in review, evaluation, examination, counseling, and coordination of care.  More than 50% of that time was spent in counseling.  I updated the patient's status to the patient and spouse.  Recommendations were made.  Questions were answered.  They expressed understanding & appreciation.  06/13/2018    Subjective: (Chief complaint)  Walked in hallways last night.  Required Dilaudid once.  Pain rather controlled  Tolerating clears.  Objective:  Vital signs:  Vitals:   06/12/18 1500 06/12/18 1543 06/12/18 2104 06/13/18 0611  BP: 113/74 136/78 121/73 102/61  Pulse: (!) 115 87 88 76  Resp: 16 16 18 18   Temp: 97.6 F (36.4 C) (!) 97.5 F (36.4 C) 98.4 F (36.9 C) 98.1 F (36.7 C)  TempSrc:   Oral Oral  SpO2: 100% 95% 98% 100%  Weight:      Height:        Last BM Date: 06/12/18  Intake/Output   Yesterday:  01/06 0701 - 01/07 0700 In: 4721.7 [P.O.:600; I.V.:3671.7; IV Piggyback:450] Out: 1975 [DJSHF:0263; Blood:50] This shift:  No intake/output data recorded.  Bowel function:  Flatus: No  BM:  No  Drain: (No drain)   Physical Exam:  General: Pt awake/alert/oriented x4 in no acute distress Eyes: PERRL, normal EOM.  Sclera clear.  No icterus Neuro: CN II-XII intact w/o focal sensory/motor deficits. Lymph: No head/neck/groin lymphadenopathy Psych:  No delerium/psychosis/paranoia HENT: Normocephalic, Mucus membranes moist.  No thrush Neck: Supple, No tracheal deviation Chest: No chest wall pain w good excursion CV:  Pulses intact.  Regular rhythm MS: Normal AROM mjr joints.  No obvious deformity  Abdomen: Soft.  Mildy distended.  Mildly tender at incisions only.  No evidence of  peritonitis.  No incarcerated hernias.  Ext:  No deformity.  No mjr edema.  No cyanosis Skin: No petechiae / purpura  Results:   Labs: Results for orders placed or performed during the hospital encounter of 06/12/18 (from the past 48  hour(s))  Basic metabolic panel     Status: Abnormal   Collection Time: 06/13/18  5:09 AM  Result Value Ref Range   Sodium 139 135 - 145 mmol/L   Potassium 4.8 3.5 - 5.1 mmol/L   Chloride 104 98 - 111 mmol/L   CO2 26 22 - 32 mmol/L   Glucose, Bld 136 (H) 70 - 99 mg/dL   BUN 18 8 - 23 mg/dL   Creatinine, Ser 1.49 (H) 0.61 - 1.24 mg/dL   Calcium 8.8 (L) 8.9 - 10.3 mg/dL   GFR calc non Af Amer 47 (L) >60 mL/min   GFR calc Af Amer 54 (L) >60 mL/min   Anion gap 9 5 - 15    Comment: Performed at [facility], [place] [address] [place], [place] [address]  CBC     Status: Abnormal   Collection Time: 06/13/18  5:09 AM  Result Value Ref Range   WBC 14.5 (H) 4.0 - 10.5 K/uL   RBC 3.96 (L) 4.22 - 5.81 MIL/uL   Hemoglobin 12.1 (L) 13.0 - 17.0 g/dL   HCT 37.8 (L) 39.0 - 52.0 %   MCV 95.5 80.0 - 100.0 fL   MCH 30.6 26.0 - 34.0 pg   MCHC 32.0 30.0 - 36.0 g/dL   RDW 15.9 (H) 11.5 - 15.5 %   Platelets 199 150 - 400 K/uL   nRBC 0.0 0.0 - 0.2 %    Comment: Performed at [facility], [place] [address] [place], [place] [address]  Magnesium     Status: None   Collection Time: 06/13/18  5:09 AM  Result Value Ref Range   Magnesium 1.7 1.7 - 2.4 mg/dL    Comment: Performed at [facility], [place] [address], [place], [place] [address]    Imaging / Studies: No results found.  Medications / Allergies: per chart  Antibiotics: Anti-infectives (From admission, onward)   Start     Dose/Rate Route Frequency Ordered Stop   06/12/18 2200  cefoTEtan (CEFOTAN) 2 g in sodium chloride 0.9 % 100 mL IVPB     2 g 200 mL/hr over 30 Minutes Intravenous Every 12 hours 06/12/18 1550 06/12/18 2353   06/12/18 1400  neomycin (MYCIFRADIN) tablet 1,000 mg  Status:  Discontinued     1,000 mg Oral 3 times per day 06/12/18 0542 06/12/18 1546   06/12/18 1400  metroNIDAZOLE (FLAGYL) tablet 1,000 mg  Status:  Discontinued     1,000 mg Oral 3 times per day 06/12/18 0542  06/12/18 1546   06/12/18 1111  clindamycin (CLEOCIN) 900 mg, gentamicin (GARAMYCIN) 240 mg in sodium chloride 0.9 % 1,000 mL for intraperitoneal lavage  Status:  Discontinued       As needed 06/12/18 1111 06/12/18 1204   06/12/18 0601  sodium chloride 0.9 % with cefoTEtan (CEFOTAN) ADS Med    Note to Pharmacy:  Kyra Leyland   : cabinet override      06/12/18 0601 06/12/18 0747   06/12/18 0600  clindamycin (CLEOCIN) 900 mg, gentamicin (GARAMYCIN) 240 mg in sodium chloride 0.9 % 1,000 mL for intraperitoneal lavage  Status:  Discontinued      Intraperitoneal To Surgery 06/11/18 0654 06/12/18 1546   06/12/18 0600  cefoTEtan (CEFOTAN) 2 g in sodium chloride 0.9 % 100 mL IVPB     2 g 200 mL/hr over 30 Minutes Intravenous On call to O.R. 06/12/18 0542 06/12/18 0757        Note: Portions of this report may have been transcribed using voice recognition software. Every effort was made to ensure accuracy; however, inadvertent computerized transcription errors may be present.   Any transcriptional errors that result from this process are unintentional.     [name], MD, FACS, MASCRS Gastrointestinal and Minimally Invasive Surgery    [address], [place], [place] [address] [phone] Main / Paging [phone] Fax

## 2018-06-14 ENCOUNTER — Ambulatory Visit: Payer: 59

## 2018-06-14 ENCOUNTER — Encounter: Payer: 59 | Admitting: Family Medicine

## 2018-06-14 LAB — CREATININE, SERUM
Creatinine, Ser: 1.28 mg/dL — ABNORMAL HIGH (ref 0.61–1.24)
GFR calc Af Amer: >60 mL/min (ref >60)
GFR calc non Af Amer: 56 mL/min — ABNORMAL LOW (ref >60)

## 2018-06-14 LAB — MAGNESIUM: Magnesium: 2.2 mg/dL (ref 1.7–2.4)

## 2018-06-14 LAB — POTASSIUM: Potassium: 4 mmol/L (ref 3.5–5.1)

## 2018-06-14 LAB — HEMOGLOBIN: Hemoglobin: 11.5 g/dL — ABNORMAL LOW (ref 13.0–17.0)

## 2018-06-14 MED ORDER — METOPROLOL TARTRATE 5 MG/5ML IV SOLN: 5.0000 mg | Freq: Four times a day (QID) | INTRAVENOUS | Status: DC | PRN

## 2018-06-14 MED ORDER — HYDRALAZINE HCL 20 MG/ML IJ SOLN: 10.0000 mg | INTRAMUSCULAR | Status: DC | PRN

## 2018-06-14 MED ORDER — TRAMADOL HCL 50 MG PO TABS: 50.0000 mg | ORAL_TABLET | Freq: Four times a day (QID) | ORAL | 0 refills | Status: DC | PRN

## 2018-06-14 NOTE — Discharge Instructions (Signed)
SURGERY: POST OP INSTRUCTIONS °(Surgery for small bowel obstruction, colon resection, etc) ° ° °###################################################################### ° °EAT °Gradually transition to a high fiber diet with a fiber supplement over the next few days after discharge ° °WALK °Walk an hour a day.  Control your pain to do that.   ° °CONTROL PAIN °Control pain so that you can walk, sleep, tolerate sneezing/coughing, go up/down stairs. ° °HAVE A BOWEL MOVEMENT DAILY °Keep your bowels regular to avoid problems.  OK to try a laxative to override constipation.  OK to use an antidairrheal to slow down diarrhea.  Call if not better after 2 tries ° °CALL IF YOU HAVE PROBLEMS/CONCERNS °Call if you are still struggling despite following these instructions. °Call if you have concerns not answered by these instructions ° °###################################################################### ° ° °DIET °Follow a light diet the first few days at home.  Start with a bland diet such as soups, liquids, starchy foods, low fat foods, etc.  If you feel full, bloated, or constipated, stay on a ful liquid or pureed/blenderized diet for a few days until you feel better and no longer constipated. °Be sure to drink plenty of fluids every day to avoid getting dehydrated (feeling dizzy, not urinating, etc.). °Gradually add a fiber supplement to your diet over the next week.  Gradually get back to a regular solid diet.  Avoid fast food or heavy meals the first week as you are more likely to get nauseated. °It is expected for your digestive tract to need a few months to get back to normal.  It is common for your bowel movements and stools to be irregular.  You will have occasional bloating and cramping that should eventually fade away.  Until you are eating solid food normally, off all pain medications, and back to regular activities; your bowels will not be normal. °Focus on eating a low-fat, high fiber diet the rest of your life  (See Getting to Good Bowel Health, below). ° °CARE of your INCISION or WOUND °It is good for closed incision and even open wounds to be washed every day.  Shower every day.  Short baths are fine.  Wash the incisions and wounds clean with soap & water.    °If you have a closed incision(s), wash the incision with soap & water every day.  You may leave closed incisions open to air if it is dry.   You may cover the incision with clean gauze & replace it after your daily shower for comfort. °If you have skin tapes (Steristrips) or skin glue (Dermabond) on your incision, leave them in place.  They will fall off on their own like a scab.  You may trim any edges that curl up with clean scissors.  If you have staples, set up an appointment for them to be removed in the office in 10 days after surgery.  °If you have a drain, wash around the skin exit site with soap & water and place a new dressing of gauze or band aid around the skin every day.  Keep the drain site clean & dry.    °If you have an open wound with packing, see wound care instructions.  In general, it is encouraged that you remove your dressing and packing, shower with soap & water, and replace your dressing once a day.  Pack the wound with clean gauze moistened with normal (0.9%) saline to keep the wound moist & uninfected.  Pressure on the dressing for 30 minutes will stop most wound   bleeding.  Eventually your body will heal & pull the open wound closed over the next few months.  °Raw open wounds will occasionally bleed or secrete yellow drainage until it heals closed.  Drain sites will drain a little until the drain is removed.  Even closed incisions can have mild bleeding or drainage the first few days until the skin edges scab over & seal.   °If you have an open wound with a wound vac, see wound vac care instructions. ° ° ° ° °ACTIVITIES as tolerated °Start light daily activities --- self-care, walking, climbing stairs-- beginning the day after surgery.   Gradually increase activities as tolerated.  Control your pain to be active.  Stop when you are tired.  Ideally, walk several times a day, eventually an hour a day.   °Most people are back to most day-to-day activities in a few weeks.  It takes 4-8 weeks to get back to unrestricted, intense activity. °If you can walk 30 minutes without difficulty, it is safe to try more intense activity such as jogging, treadmill, bicycling, low-impact aerobics, swimming, etc. °Save the most intensive and strenuous activity for last (Usually 4-8 weeks after surgery) such as sit-ups, heavy lifting, contact sports, etc.  Refrain from any intense heavy lifting or straining until you are off narcotics for pain control.  You will have off days, but things should improve week-by-week. °DO NOT PUSH THROUGH PAIN.  Let pain be your guide: If it hurts to do something, don't do it.  Pain is your body warning you to avoid that activity for another week until the pain goes down. °You may drive when you are no longer taking narcotic prescription pain medication, you can comfortably wear a seatbelt, and you can safely make sudden turns/stops to protect yourself without hesitating due to pain. °You may have sexual intercourse when it is comfortable. If it hurts to do something, stop. ° °MEDICATIONS °Take your usually prescribed home medications unless otherwise directed.   °Blood thinners:  °Usually you can restart any strong blood thinners after the second postoperative day.  It is OK to take aspirin right away.    ° If you are on strong blood thinners (warfarin/Coumadin, Plavix, Xerelto, Eliquis, Pradaxa, etc), discuss with your surgeon, medicine PCP, and/or cardiologist for instructions on when to restart the blood thinner & if blood monitoring is needed (PT/INR blood check, etc).   ° ° °PAIN CONTROL °Pain after surgery or related to activity is often due to strain/injury to muscle, tendon, nerves and/or incisions.  This pain is usually  short-term and will improve in a few months.  °To help speed the process of healing and to get back to regular activity more quickly, DO THE FOLLOWING THINGS TOGETHER: °1. Increase activity gradually.  DO NOT PUSH THROUGH PAIN °2. Use Ice and/or Heat °3. Try Gentle Massage and/or Stretching °4. Take over the counter pain medication °5. Take Narcotic prescription pain medication for more severe pain ° °Good pain control = faster recovery.  It is better to take more medicine to be more active than to stay in bed all day to avoid medications. °1.  Increase activity gradually °Avoid heavy lifting at first, then increase to lifting as tolerated over the next 6 weeks. °Do not “push through” the pain.  Listen to your body and avoid positions and maneuvers than reproduce the pain.  Wait a few days before trying something more intense °Walking an hour a day is encouraged to help your body recover faster   and more safely.  Start slowly and stop when getting sore.  If you can walk 30 minutes without stopping or pain, you can try more intense activity (running, jogging, aerobics, cycling, swimming, treadmill, sex, sports, weightlifting, etc.) °Remember: If it hurts to do it, then don’t do it! °2. Use Ice and/or Heat °You will have swelling and bruising around the incisions.  This will take several weeks to resolve. °Ice packs or heating pads (6-8 times a day, 30-60 minutes at a time) will help sooth soreness & bruising. °Some people prefer to use ice alone, heat alone, or alternate between ice & heat.  Experiment and see what works best for you.  Consider trying ice for the first few days to help decrease swelling and bruising; then, switch to heat to help relax sore spots and speed recovery. °Shower every day.  Short baths are fine.  It feels good!  Keep the incisions and wounds clean with soap & water.   °3. Try Gentle Massage and/or Stretching °Massage at the area of pain many times a day °Stop if you feel pain - do not  overdo it °4. Take over the counter pain medication °This helps the muscle and nerve tissues become less irritable and calm down faster °Choose ONE of the following over-the-counter anti-inflammatory medications: °Acetaminophen 500mg tabs (Tylenol) 1-2 pills with every meal and just before bedtime (avoid if you have liver problems or if you have acetaminophen in you narcotic prescription) °Naproxen 220mg tabs (ex. Aleve, Naprosyn) 1-2 pills twice a day (avoid if you have kidney, stomach, IBD, or bleeding problems) °Ibuprofen 200mg tabs (ex. Advil, Motrin) 3-4 pills with every meal and just before bedtime (avoid if you have kidney, stomach, IBD, or bleeding problems) °Take with food/snack several times a day as directed for at least 2 weeks to help keep pain / soreness down & more manageable. °5. Take Narcotic prescription pain medication for more severe pain °A prescription for strong pain control is often given to you upon discharge (for example: oxycodone/Percocet, hydrocodone/Norco/Vicodin, or tramadol/Ultram) °Take your pain medication as prescribed. °Be mindful that most narcotic prescriptions contain Tylenol (acetaminophen) as well - avoid taking too much Tylenol. °If you are having problems/concerns with the prescription medicine (does not control pain, nausea, vomiting, rash, itching, etc.), please call us [phone] to see if we need to switch you to a different pain medicine that will work better for you and/or control your side effects better. °If you need a refill on your pain medication, you must call the office before 4 pm and on weekdays only.  By federal law, prescriptions for narcotics cannot be called into a pharmacy.  They must be filled out on paper & picked up from our office by the patient or authorized caretaker.  Prescriptions cannot be filled after 4 pm nor on weekends.   ° °WHEN TO CALL US [phone] °Severe uncontrolled or worsening pain  °Fever over 101 F (38.5 C) °Concerns with  the incision: Worsening pain, redness, rash/hives, swelling, bleeding, or drainage °Reactions / problems with new medications (itching, rash, hives, nausea, etc.) °Nausea and/or vomiting °Difficulty urinating °Difficulty breathing °Worsening fatigue, dizziness, lightheadedness, blurred vision °Other concerns °If you are not getting better after two weeks or are noticing you are getting worse, contact our office [phone] for further advice.  We may need to adjust your medications, re-evaluate you in the office, send you to the emergency room, or see what other things we can do to help. °The   clinic staff is available to answer your questions during regular business hours (8:30am-5pm).  Please don’t hesitate to call and ask to speak to one of our nurses for clinical concerns.    °A surgeon from [facility] is always on call at the hospitals 24 hours/day °If you have a medical emergency, go to the nearest emergency room or call 911. ° °FOLLOW UP in our office °One the day of your discharge from the hospital (or the next business weekday), please call [facility] to set up or confirm an appointment to see your surgeon in the office for a follow-up appointment.  Usually it is 2-3 weeks after your surgery.   °If you have skin staples at your incision(s), let the office know so we can set up a time in the office for the nurse to remove them (usually around 10 days after surgery). °Make sure that you call for appointments the day of discharge (or the next business weekday) from the hospital to ensure a convenient appointment time. °IF YOU HAVE DISABILITY OR FAMILY LEAVE FORMS, BRING THEM TO THE OFFICE FOR PROCESSING.  DO NOT GIVE THEM TO YOUR DOCTOR. ° °[facility] °[address], [address], [place], [place]  [address] ? °[phone] - Main °[phone] - Toll Free,  [phone] - Fax °[email or web address] ° °GETTING TO GOOD BOWEL HEALTH. °It is  expected for your digestive tract to need a few months to get back to normal.  It is common for your bowel movements and stools to be irregular.  You will have occasional bloating and cramping that should eventually fade away.  Until you are eating solid food normally, off all pain medications, and back to regular activities; your bowels will not be normal.   °Avoiding constipation °The goal: ONE SOFT BOWEL MOVEMENT A DAY!    °Drink plenty of fluids.  Choose water first. °TAKE A FIBER SUPPLEMENT EVERY DAY THE REST OF YOUR LIFE °During your first week back home, gradually add back a fiber supplement every day °Experiment which form you can tolerate.   There are many forms such as powders, tablets, wafers, gummies, etc °Psyllium bran (Metamucil), methylcellulose (Citrucel), Miralax or Glycolax, Benefiber, Flax Seed.  °Adjust the dose week-by-week (1/2 dose/day to 6 doses a day) until you are moving your bowels 1-2 times a day.  Cut back the dose or try a different fiber product if it is giving you problems such as diarrhea or bloating. °Sometimes a laxative is needed to help jump-start bowels if constipated until the fiber supplement can help regulate your bowels.  If you are tolerating eating & you are farting, it is okay to try a gentle laxative such as double dose MiraLax, prune juice, or Milk of Magnesia.  Avoid using laxatives too often. °Stool softeners can sometimes help counteract the constipating effects of narcotic pain medicines.  It can also cause diarrhea, so avoid using for too long. °If you are still constipated despite taking fiber daily, eating solids, and a few doses of laxatives, call our office. °Controlling diarrhea °Try drinking liquids and eating bland foods for a few days to avoid stressing your intestines further. °Avoid dairy products (especially milk & ice cream) for a short time.  The intestines often can lose the ability to digest lactose when stressed. °Avoid foods that cause gassiness or  bloating.  Typical foods include beans and other legumes, cabbage, broccoli, and dairy foods.  Avoid greasy, spicy, fast foods.  Every person has   some sensitivity to other foods, so listen to your body and avoid those foods that trigger problems for you. °Probiotics (such as active yogurt, Align, etc) may help repopulate the intestines and colon with normal bacteria and calm down a sensitive digestive tract °Adding a fiber supplement gradually can help thicken stools by absorbing excess fluid and retrain the intestines to act more normally.  Slowly increase the dose over a few weeks.  Too much fiber too soon can backfire and cause cramping & bloating. °It is okay to try and slow down diarrhea with a few doses of antidiarrheal medicines.   °Bismuth subsalicylate (ex. Kayopectate, Pepto Bismol) for a few doses can help control diarrhea.  Avoid if pregnant.   °Loperamide (Imodium) can slow down diarrhea.  Start with one tablet (2mg) first.  Avoid if you are having fevers or severe pain.  °ILEOSTOMY PATIENTS WILL HAVE CHRONIC DIARRHEA since their colon is not in use.    °Drink plenty of liquids.  You will need to drink even more glasses of water/liquid a day to avoid getting dehydrated. °Record output from your ileostomy.  Expect to empty the bag every 3-4 hours at first.  Most people with a permanent ileostomy empty their bag 4-6 times at the least.   °Use antidiarrheal medicine (especially Imodium) several times a day to avoid getting dehydrated.  Start with a dose at bedtime & breakfast.  Adjust up or down as needed.  Increase antidiarrheal medications as directed to avoid emptying the bag more than 8 times a day (every 3 hours). °Work with your wound ostomy nurse to learn care for your ostomy.  See ostomy care instructions. °TROUBLESHOOTING IRREGULAR BOWELS °1) Start with a soft & bland diet. No spicy, greasy, or fried foods.  °2) Avoid gluten/wheat or dairy products from diet to see if symptoms improve. °3) Miralax  17gm or flax seed mixed in 8oz. water or juice-daily. May use 2-4 times a day as needed. °4) Gas-X, Phazyme, etc. as needed for gas & bloating.  °5) Prilosec (omeprazole) over-the-counter as needed °6)  Consider probiotics (Align, Activa, etc) to help calm the bowels down ° °Call your doctor if you are getting worse or not getting better.  Sometimes further testing (cultures, endoscopy, X-ray studies, CT scans, bloodwork, etc.) may be needed to help diagnose and treat the cause of the diarrhea. °[facility] °[address], [address], [place], [place]  [address] °[phone] - Main.    °[phone]  - Toll Free.   [phone] - Fax °[email or web address] ° ° °GETTING TO GOOD BOWEL HEALTH. ° °###################################################################### ° °EAT °Gradually transition to a high fiber diet with a fiber supplement over the next few weeks after discharge.  Start with a pureed / full liquid diet (see below) ° °WALK °Walk an hour a day.  Control your pain to do that.   ° °HAVE A BOWEL MOVEMENT DAILY °Keep your bowels regular to avoid problems.  OK to try a laxative to override constipation.  OK to use an antidairrheal to slow down diarrhea.  Call if not better after 2 tries ° °CALL IF YOU HAVE PROBLEMS/CONCERNS °Call if you are still struggling despite following these instructions. °Call if you have concerns not answered by these instructions ° °###################################################################### ° ° °Irregular bowel habits such as constipation and diarrhea can lead to many problems over time.  Having one soft bowel movement a day is the most important way to prevent further problems.  The anorectal canal is   designed to handle stretching and feces to safely manage our ability to get rid of solid waste (feces, poop, stool) out of our body.  BUT, hard constipated stools can act like ripping concrete bricks and diarrhea can be a burning fire to  this very sensitive area of our body, causing inflamed hemorrhoids, anal fissures, increasing risk is perirectal abscesses, abdominal pain/bloating, an making irritable bowel worse.     ° °The goal: ONE SOFT BOWEL MOVEMENT A DAY!  To have soft, regular bowel movements:  °• Drink plenty of fluids, consider 4-6 tall glasses of water a day.   °• Take plenty of fiber.  Fiber is the undigested part of plant food that passes into the colon, acting s “natures broom” to encourage bowel motility and movement.  Fiber can absorb and hold large amounts of water. This results in a larger, bulkier stool, which is soft and easier to pass. Work gradually over several weeks up to 6 servings a day of fiber (25g a day even more if needed) in the form of: °o Vegetables -- Root (potatoes, carrots, turnips), leafy green (lettuce, salad greens, celery, spinach), or cooked high residue (cabbage, broccoli, etc) °o Fruit -- Fresh (unpeeled skin & pulp), Dried (prunes, apricots, cherries, etc ),  or stewed ( applesauce)  °o Whole grain breads, pasta, etc (whole wheat)  °o Bran cereals  °• Bulking Agents -- This type of water-retaining fiber generally is easily obtained each day by one of the following:  °o Psyllium bran -- The psyllium plant is remarkable because its ground seeds can retain so much water. This product is available as Metamucil, Konsyl, Effersyllium, Per Diem Fiber, or the less expensive generic preparation in drug and health food stores. Although labeled a laxative, it really is not a laxative.  °o Methylcellulose -- This is another fiber derived from wood which also retains water. It is available as Citrucel. °o Polyethylene Glycol - and “artificial” fiber commonly called Miralax or Glycolax.  It is helpful for people with gassy or bloated feelings with regular fiber °o Flax Seed - a less gassy fiber than psyllium °• No reading or other relaxing activity while on the toilet. If bowel movements take longer than 5 minutes, you  are too constipated °• AVOID CONSTIPATION.  High fiber and water intake usually takes care of this.  Sometimes a laxative is needed to stimulate more frequent bowel movements, but  °• Laxatives are not a good long-term solution as it can wear the colon out.  They can help jump-start bowels if constipated, but should be relied on constantly without discussing with your doctor °o Osmotics (Milk of Magnesia, Fleets phosphosoda, Magnesium citrate, MiraLax, GoLytely) are safer than  °o Stimulants (Senokot, Castor Oil, Dulcolax, Ex Lax)    °o Avoid taking laxatives for more than 7 days in a row. °•  IF SEVERELY CONSTIPATED, try a Bowel Retraining Program: °o Do not use laxatives.  °o Eat a diet high in roughage, such as bran cereals and leafy vegetables.  °o Drink six (6) ounces of prune or apricot juice each morning.  °o Eat two (2) large servings of stewed fruit each day.  °o Take one (1) heaping tablespoon of a psyllium-based bulking agent twice a day. Use sugar-free sweetener when possible to avoid excessive calories.  °o Eat a normal breakfast.  °o Set aside 15 minutes after breakfast to sit on the toilet, but do not strain to have a bowel movement.  °o If you do not   have a bowel movement by the third day, use an enema and repeat the above steps.  °• Controlling diarrhea °o Switch to liquids and simpler foods for a few days to avoid stressing your intestines further. °o Avoid dairy products (especially milk & ice cream) for a short time.  The intestines often can lose the ability to digest lactose when stressed. °o Avoid foods that cause gassiness or bloating.  Typical foods include beans and other legumes, cabbage, broccoli, and dairy foods.  Every person has some sensitivity to other foods, so listen to our body and avoid those foods that trigger problems for you. °o Adding fiber (Citrucel, Metamucil, psyllium, Miralax) gradually can help thicken stools by absorbing excess fluid and retrain the intestines to act  more normally.  Slowly increase the dose over a few weeks.  Too much fiber too soon can backfire and cause cramping & bloating. °o Probiotics (such as active yogurt, Align, etc) may help repopulate the intestines and colon with normal bacteria and calm down a sensitive digestive tract.  Most studies show it to be of mild help, though, and such products can be costly. °o Medicines: °- Bismuth subsalicylate (ex. Kayopectate, Pepto Bismol) every 30 minutes for up to 6 doses can help control diarrhea.  Avoid if pregnant. °- Loperamide (Immodium) can slow down diarrhea.  Start with two tablets (4mg total) first and then try one tablet every 6 hours.  Avoid if you are having fevers or severe pain.  If you are not better or start feeling worse, stop all medicines and call your doctor for advice °o Call your doctor if you are getting worse or not better.  Sometimes further testing (cultures, endoscopy, X-ray studies, bloodwork, etc) may be needed to help diagnose and treat the cause of the diarrhea. ° °TROUBLESHOOTING IRREGULAR BOWELS °1) Avoid extremes of bowel movements (no bad constipation/diarrhea) °2) Miralax 17gm mixed in 8oz. water or juice-daily. May use BID as needed.  °3) Gas-x,Phazyme, etc. as needed for gas & bloating.  °4) Soft,bland diet. No spicy,greasy,fried foods.  °5) Prilosec over-the-counter as needed  °6) May hold gluten/wheat products from diet to see if symptoms improve.  °7)  May try probiotics (Align, Activa, etc) to help calm the bowels down °7) If symptoms become worse call back immediately. ° °

## 2018-06-14 NOTE — Discharge Summary (Signed)
Physician Discharge Summary    Patient ID: Dave Vaughn MRN: [id number] DOB/AGE: [birth year]  73 y.o.  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 06/12/2018 Active Problems:   Cancer of left colon [facility])   Chronic atrial fibrillation   Chronic anticoagulation   CKD (chronic kidney disease) stage 3, GFR 30-59 ml/min ([place])   Hypomagnesemia   2 Days Post-Op  06/12/2018  POST-OPERATIVE DIAGNOSIS:   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  Underwent post adjuvant chemotherapy.  Desired colostomy takedown.  Cleared for surgery.  The patient underwent the surgery above.  Postoperatively, the patient gradually mobilized and advanced to a solid diet.  Pain and other symptoms were treated aggressively.    By the time of discharge, the patient was walking well the hallways, eating food, having flatus.  Pain was well-controlled on an oral medications.  Based on meeting discharge criteria and  continuing to recover, I felt it was safe for the patient to be discharged from the hospital to further recover with close followup. Postoperative recommendations were discussed in detail.  They are written as well.  Discharged Condition: good  Discharge Exam: Blood pressure 121/78, pulse (!) 112, temperature 98.2 F (36.8 C), temperature source Oral, resp. rate 16, height 5\' 11"  (1.803 m), weight 102.5 kg, SpO2 94 %.  General: Pt awake/alert/oriented x4 in No acute distress Eyes: PERRL, normal EOM.  Sclera clear.  No icterus Neuro: CN II-XII intact w/o focal sensory/motor deficits. Lymph: No head/neck/groin lymphadenopathy Psych:  No delerium/psychosis/paranoia HENT: Normocephalic, Mucus membranes moist.  No thrush Neck: Supple, No tracheal deviation Chest: No chest wall pain w good excursion CV:  Pulses intact.  Regular rhythm MS: Normal AROM mjr joints.  No obvious deformity Abdomen: Soft.  Nondistended.  Mildly tender at incisions only.  No evidence of peritonitis.  No incarcerated hernias. Ext:  SCDs BLE.  No mjr edema.  No cyanosis Skin: No petechiae / purpura   Disposition:   Follow-up Information    [name], MD. Schedule an appointment as soon as possible for a visit in 3 week(s).   Specialty:  General Surgery Contact information: [place] [address] [phone]           Discharge disposition: 01-Home or Self Care       Discharge Instructions    Call MD for:   Complete by:  As directed    FEVER > 101.5 F  (temperatures < 101.5 F are not significant)   Call MD for:  extreme fatigue   Complete by:  As directed    Call MD for:  persistant dizziness  or light-headedness   Complete by:  As directed    Call MD for:  persistant nausea and vomiting   Complete by:  As directed    Call MD for:  redness, tenderness, or signs of infection (pain, swelling, redness, odor or green/yellow discharge around incision site)   Complete  by:  As directed    Call MD for:  severe uncontrolled pain   Complete by:  As directed    Diet - low sodium heart healthy   Complete by:  As directed    Start with a bland diet such as soups, liquids, starchy foods, low fat foods, etc. the first few days at home. Gradually advance to a solid, low-fat, high fiber diet by the end of the first week at home.   Add a fiber supplement to your diet (Metamucil, etc) If you feel full, bloated, or constipated, stay on a full liquid or pureed/blenderized diet for a few days until you feel better and are no longer constipated.   Discharge instructions   Complete by:  As directed    See Discharge Instructions If you are not getting better after two weeks or are noticing you are getting worse, contact our office [phone] for further advice.  We may need to adjust your medications, re-evaluate you in the office, send you to the emergency room, or see what other things we can do to help. The clinic staff is available to answer your questions during regular business hours (8:30am-5pm).  Please don't hesitate to call and ask to speak to one of our nurses for clinical concerns.    A surgeon from [facility] is always on call at the hospitals 24 hours/day If you have a medical emergency, go to the nearest emergency room or call 911.   Discharge wound care:   Complete by:  As directed    It is good for closed incisions and even open wounds to be washed every day.  Shower every day.  Short baths are fine.  Wash the incisions and wounds clean with soap & water.     You may leave closed incisions open to air if it is dry.   You may cover the incision with clean gauze & replace it after your daily shower for comfort.   Driving Restrictions   Complete by:  As directed    You may drive when: - you are no longer taking narcotic prescription pain medication - you can comfortably wear a seatbelt - you can safely make sudden turns/stops without pain.     Increase activity slowly   Complete by:  As directed    Start light daily activities --- self-care, walking, climbing stairs- beginning the day after surgery.  Gradually increase activities as tolerated.  Control your pain to be active.  Stop when you are tired.  Ideally, walk several times a day, eventually an hour a day.   Most people are back to most day-to-day activities in a few weeks.  It takes 4-6 weeks to get back to unrestricted, intense activity. If you can walk 30 minutes without difficulty, it is safe to try more intense activity such as jogging, treadmill, bicycling, low-impact aerobics, swimming, etc. Save the most intensive and strenuous activity for last (Usually 4-8 weeks after surgery) such as sit-ups, heavy lifting, contact sports, etc.  Refrain from any intense heavy lifting or straining until you are off narcotics for pain control.  You will have off days, but things should improve week-by-week. DO  NOT PUSH THROUGH PAIN.  Let pain be your guide: If it hurts to do something, don't do it.   Lifting restrictions   Complete by:  As directed    If you can walk 30 minutes without difficulty, it is safe to try more intense activity such as jogging, treadmill, bicycling, low-impact aerobics, swimming, etc. Save the most intensive and strenuous activity for last (Usually 4-8 weeks after surgery) such as sit-ups, heavy lifting, contact sports, etc.   Refrain from any intense heavy lifting or straining until you are off narcotics for pain control.  You will have off days, but things should improve week-by-week. DO NOT PUSH THROUGH PAIN.  Let pain be your guide: If it hurts to do something, don't do it.  Pain is your body warning you to avoid that activity for another week until the pain goes down.   May shower / Bathe   Complete by:  As directed    May walk up steps   Complete by:  As directed    Sexual Activity Restrictions   Complete by:  As directed    You may have sexual  intercourse when it is comfortable. If it hurts to do something, stop.      Allergies as of 06/14/2018   No Known Allergies     Medication List    STOP taking these medications   docusate sodium 100 MG capsule Commonly known as:  COLACE   ondansetron 8 MG tablet Commonly known as:  ZOFRAN     TAKE these medications   acetaminophen 500 MG tablet Commonly known as:  TYLENOL Take 1,000 mg by mouth every 6 (six) hours as needed (for pain.).   ALPRAZolam 0.25 MG tablet Commonly known as:  XANAX Take 1 tablet (0.25 mg total) by mouth at bedtime as needed for anxiety.   amiodarone 200 MG tablet Commonly known as:  PACERONE Take 1 tablet (200 mg total) by mouth 2 (two) times daily.   apixaban 5 MG Tabs tablet Commonly known as:  ELIQUIS Take 1 tablet (5 mg total) by mouth 2 (two) times daily.   b complex vitamins tablet Take 1 tablet by mouth daily.   diltiazem 240 MG 24 hr capsule Commonly known as:  CARTIA XT Take 1 capsule (240 mg total) by mouth daily. What changed:  when to take this   lidocaine-prilocaine cream Commonly known as:  EMLA Apply to affected area once What changed:    how much to take  how to take this  when to take this  reasons to take this  additional instructions   MULTIVITAMIN/IRON PO Take 1 tablet by mouth 2 (two) times daily.   tamsulosin 0.4 MG Caps capsule Commonly known as:  FLOMAX Take 0.4 mg by mouth 2 (two) times daily.   traMADol 50 MG tablet Commonly known as:  ULTRAM Take 1-2 tablets (50-100 mg total) by mouth every 6 (six) hours as needed for moderate pain or severe pain (mild pain).            Discharge Care Instructions  (From admission, onward)         Start     Ordered   06/14/18 0000  Discharge wound care:    Comments:  It is good for closed incisions and even open wounds to be washed every day.  Shower every day.  Short baths are fine.  Wash the incisions and wounds clean with soap & water.     You may  leave closed  incisions open to air if it is dry.   You may cover the incision with clean gauze & replace it after your daily shower for comfort.   06/14/18 1649          Significant Diagnostic Studies:  Results for orders placed or performed during the hospital encounter of 06/12/18 (from the past 72 hour(s))  Basic metabolic panel     Status: Abnormal   Collection Time: 06/13/18  5:09 AM  Result Value Ref Range   Sodium 139 135 - 145 mmol/L   Potassium 4.8 3.5 - 5.1 mmol/L   Chloride 104 98 - 111 mmol/L   CO2 26 22 - 32 mmol/L   Glucose, Bld 136 (H) 70 - 99 mg/dL   BUN 18 8 - 23 mg/dL   Creatinine, Ser 1.49 (H) 0.61 - 1.24 mg/dL   Calcium 8.8 (L) 8.9 - 10.3 mg/dL   GFR calc non Af Amer 47 (L) >60 mL/min   GFR calc Af Amer 54 (L) >60 mL/min   Anion gap 9 5 - 15    Comment: Performed at [facility], [place] [address], [place], [place] [address]  CBC     Status: Abnormal   Collection Time: 06/13/18  5:09 AM  Result Value Ref Range   WBC 14.5 (H) 4.0 - 10.5 K/uL   RBC 3.96 (L) 4.22 - 5.81 MIL/uL   Hemoglobin 12.1 (L) 13.0 - 17.0 g/dL   HCT 37.8 (L) 39.0 - 52.0 %   MCV 95.5 80.0 - 100.0 fL   MCH 30.6 26.0 - 34.0 pg   MCHC 32.0 30.0 - 36.0 g/dL   RDW 15.9 (H) 11.5 - 15.5 %   Platelets 199 150 - 400 K/uL   nRBC 0.0 0.0 - 0.2 %    Comment: Performed at [facility], [place] [address] [place], [place] [address]  Magnesium     Status: None   Collection Time: 06/13/18  5:09 AM  Result Value Ref Range   Magnesium 1.7 1.7 - 2.4 mg/dL    Comment: Performed at [facility], [place] [address] [place], [place] [address]  Creatinine, serum     Status: Abnormal   Collection Time: 06/14/18  4:27 AM  Result Value Ref Range   Creatinine, Ser 1.28 (H) 0.61 - 1.24 mg/dL   GFR calc non Af Amer 56 (L) >60 mL/min   GFR calc Af Amer >60 >60 mL/min    Comment: Performed at [facility], [place] [address] [place], [place]  [address]  Potassium     Status: None   Collection Time: 06/14/18  4:27 AM  Result Value Ref Range   Potassium 4.0 3.5 - 5.1 mmol/L    Comment: Performed at [facility], [place] [address], [place], [place] [address]  Magnesium     Status: None   Collection Time: 06/14/18  4:27 AM  Result Value Ref Range   Magnesium 2.2 1.7 - 2.4 mg/dL    Comment: Performed at [facility], [place] [address], [place], [place] [address]  Hemoglobin     Status: Abnormal   Collection Time: 06/14/18  4:27 AM  Result Value Ref Range   Hemoglobin 11.5 (L) 13.0 - 17.0 g/dL    Comment: Performed at [facility], [place] [address], [place], [place] [address]    No results found.  Past Medical History:  Diagnosis Date  . Adenocarcinoma, colon ([place])   . Anemia  taking iron supplements  . Atrial fibrillation with RVR ([place])   . Colonic obstruction ([place]) 01/10/2018  . Diverticulitis   . Dysrhythmia    afib  . History of kidney stones     Past Surgical History:  Procedure Laterality Date  . ANKLE SURGERY Left    when he was in college  . COLON RESECTION N/A 01/11/2018   Procedure: LEFT COLON RESECTION, TAKEDOWN SPLENIC FLEXURE, COLOSTOMY;  Surgeon: [name], MD;  Location: [facility];  Service: General;  Laterality: N/A;  . COLONOSCOPY  01/11/2018   Procedure: COLONOSCOPY;  Surgeon: Jackquline Denmark, MD;  Location: [facility];  Service: Endoscopy;;  . colonscopy  05/10/2018  . LYSIS OF ADHESION N/A 06/12/2018   Procedure: LYSIS OF ADHESIONS;  Surgeon: [name], MD;  Location: [facility];  Service: General;  Laterality: N/A;  . PORTACATH PLACEMENT Right 03/07/2018   Procedure: INSERTION PORT-A-CATH RIGHT SUBCLAVIAN;  Surgeon: [name], MD;  Location: [facility];  Service: General;  Laterality: Right;  . PROCTOSCOPY N/A 06/12/2018   Procedure: RIGID PROCTOSCOPY;  Surgeon: [name], MD;  Location: [facility];  Service: General;  Laterality: N/A;  . thumb surgery   2018    cyst removal    Social History   Socioeconomic History  . Marital status: Married    Spouse name: Not on file  . Number of children: Not on file  . Years of education: Not on file  . Highest education level: Not on file  Occupational History  . Not on file  Social Needs  . Financial resource strain: Not on file  . Food insecurity:    Worry: Not on file    Inability: Not on file  . Transportation needs:    Medical: Not on file    Non-medical: Not on file  Tobacco Use  . Smoking status: Never Smoker  . Smokeless tobacco: Former Systems developer    Types: Chew  Substance and Sexual Activity  . Alcohol use: Yes    Comment: drinks socially about 2x/week  . Drug use: Never  . Sexual activity: Not on file  Lifestyle  . Physical activity:    Days per week: Not on file    Minutes per session: Not on file  . Stress: Not on file  Relationships  . Social connections:    Talks on phone: Not on file    Gets together: Not on file    Attends religious service: Not on file    Active member of club or organization: Not on file    Attends meetings of clubs or organizations: Not on file    Relationship status: Not on file  . Intimate partner violence:    Fear of current or ex partner: Not on file    Emotionally abused: Not on file    Physically abused: Not on file    Forced sexual activity: Not on file  Other Topics Concern  . Not on file  Social History Narrative  . Not on file    Family History  Problem Relation Age of Onset  . Breast cancer Mother 57       metastatin; recurrence x7  . Heart attack Father 36  . Cancer Daughter        melanoma, leukemia  . Esophageal cancer Neg Hx   . Colon cancer Neg Hx   . Rectal cancer Neg Hx   . Ulcerative colitis Neg Hx     Current Facility-Administered Medications  Medication Dose Route Frequency Provider Last Rate  Last Dose  . acetaminophen (TYLENOL) tablet 1,000 mg  1,000 mg Oral Lajuana Ripple, MD   1,000 mg at 06/14/18 1300  .  ALPRAZolam Duanne Moron) tablet 0.25 mg  0.25 mg Oral QHS PRN [name], MD      . alum & mag hydroxide-simeth (MAALOX/MYLANTA) 200-200-20 MG/5ML suspension 30 mL  30 mL Oral Q6H PRN [name], MD      . alvimopan (ENTEREG) capsule 12 mg  12 mg Oral BID [name], MD   12 mg at 06/13/18 2307  . amiodarone (PACERONE) tablet 200 mg  200 mg Oral BID [name], MD   200 mg at 06/14/18 0951  . diltiazem (CARDIZEM CD) 24 hr capsule 240 mg  240 mg Oral Ardeen Fillers, MD   240 mg at 06/12/18 2114  . diphenhydrAMINE (BENADRYL) 12.5 MG/5ML elixir 12.5 mg  12.5 mg Oral Q6H PRN [name], MD       Or  . diphenhydrAMINE (BENADRYL) injection 12.5 mg  12.5 mg Intravenous Q6H PRN [name], MD      . enoxaparin (LOVENOX) injection 40 mg  40 mg Subcutaneous Q24H [name], MD   40 mg at 06/14/18 0950  . feeding supplement (ENSURE SURGERY) liquid 237 mL  237 mL Oral BID BM [name], MD   237 mL at 06/14/18 0951  . gabapentin (NEURONTIN) capsule 300 mg  300 mg Oral BID [name], MD   300 mg at 06/14/18 0951  . hydrALAZINE (APRESOLINE) injection 10 mg  10 mg Intravenous Q2H PRN [name], MD      . HYDROmorphone (DILAUDID) injection 0.5-2 mg  0.5-2 mg Intravenous Q4H PRN [name], MD   1 mg at 06/13/18 1448  . lip balm (CARMEX) ointment 1 application  1 application Topical BID [name], MD   1 application at 00/76/22 [phone]  . magic mouthwash  15 mL Oral QID PRN [name], MD      . metoCLOPramide (REGLAN) injection 10 mg  10 mg Intravenous Q6H PRN [name], MD   10 mg at 06/13/18 1448  . metoprolol tartrate (LOPRESSOR) injection 5 mg  5 mg Intravenous Q6H PRN [name], MD      . ondansetron [facility]) tablet 4 mg  4 mg Oral Q6H PRN [name], MD       Or  . ondansetron [facility]) injection 4 mg  4 mg Intravenous Q6H PRN [name], MD   4 mg at 06/13/18 1242  . prochlorperazine (COMPAZINE) tablet 10 mg  10 mg Oral Q6H PRN [name], MD       Or    . prochlorperazine (COMPAZINE) injection 5-10 mg  5-10 mg Intravenous Q6H PRN [name], MD   5 mg at 06/13/18 1359  . psyllium (HYDROCIL/METAMUCIL) packet 1 packet  1 packet Oral Daily [name], MD   1 packet at 06/13/18 [phone]  . saccharomyces boulardii (FLORASTOR) capsule 250 mg  250 mg Oral BID [name], MD   250 mg at 06/14/18 0951  . tamsulosin (FLOMAX) capsule 0.4 mg  0.4 mg Oral BID [name], MD   0.4 mg at 06/14/18 0951  . traMADol (ULTRAM) tablet 50-100 mg  50-100 mg Oral Q6H PRN [name], MD   100 mg at 06/13/18 0951     No Known Allergies  Signed: Morton Peters, MD, FACS, MASCRS Gastrointestinal and Minimally Invasive Surgery    [address], [place] [address] [place],  [address] [phone]  [phone] Main / Paging [phone] Fax   06/14/2018, 4:49 PM

## 2018-06-14 NOTE — Progress Notes (Signed)
Foley was removed at 0515.

## 2018-06-19 ENCOUNTER — Other Ambulatory Visit: Payer: 59

## 2018-06-19 ENCOUNTER — Ambulatory Visit: Payer: 59 | Admitting: Hematology

## 2018-06-26 ENCOUNTER — Inpatient Hospital Stay: Payer: 59 | Attending: Hematology

## 2018-06-26 DIAGNOSIS — Z85038 Personal history of other malignant neoplasm of large intestine: Secondary | ICD-10-CM | POA: Diagnosis not present

## 2018-06-26 DIAGNOSIS — Z452 Encounter for adjustment and management of vascular access device: Secondary | ICD-10-CM | POA: Diagnosis not present

## 2018-06-26 DIAGNOSIS — Z95828 Presence of other vascular implants and grafts: Secondary | ICD-10-CM

## 2018-06-26 MED ORDER — HEPARIN SOD (PORK) LOCK FLUSH 100 UNIT/ML IV SOLN
500.0000 [IU] | Freq: Once | INTRAVENOUS | Status: AC | PRN
  Administered 2018-06-26: 500 [IU]
  Filled 2018-06-26: qty 5

## 2018-06-26 MED ORDER — SODIUM CHLORIDE 0.9% FLUSH
10.0000 mL | INTRAVENOUS | Status: DC | PRN
  Administered 2018-06-26: 10 mL
  Filled 2018-06-26: qty 10

## 2018-08-11 NOTE — Progress Notes (Signed)
[place]   [phone] [phone]) [phone]   Clinic Follow up Note   Patient Care Team: [name], MD as PCP - General (Family Medicine) [name], MD as Consulting Physician (Cardiology) [name], MD as Consulting Physician (Gastroenterology) [name], MD as Consulting Physician (General Surgery) [name], MD as Consulting Physician (General Surgery) [name], MD as Consulting Physician (Urology) [name], MD as Consulting Physician (Medical Oncology)  Date of Service:  08/14/2018  CHIEF COMPLAINT: F/u on colon cancer  SUMMARY OF ONCOLOGIC HISTORY: Oncology History   Cancer Staging Cancer of left colon [facility]) Staging form: Colon and Rectum, AJCC 8th Edition - Pathologic stage from 01/11/2018: Stage IIIB (pT3, pN1c, cM0) - Signed by [name], MD on 01/16/2018       Cancer of left colon ([place])   01/10/2018 Imaging    CT AP W Contrast 01/10/18  IMPRESSION: Irregular soft tissue density causing stricture of the mid descending colon likely the site of obstruction for the dilated small bowel. 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 No evidence of perforation.  Equivocal findings involving the appendix measuring 1.2 cm at the appendiceal tip with mucosal enhancement. No adjacent free fluid or inflammatory change. Findings are nonspecific, but can be seen with early acute appendicitis.  Mild prostatic enlargement. Increased density over the posterior bladder base likely due to the large prostatic impression although cannot completely exclude a bladder mass. Urology protocol CT or ultrasound may be helpful for better evaluation.  Mild cholelithiasis.  Stable 1.5 cm cystic structure over the lower pole right kidney likely slightly hyperdense cyst.  Diverticulosis of the colon.  Aortic Atherosclerosis (ICD10-I70.0).      01/11/2018 Cancer Staging    Staging form: Colon and Rectum, AJCC 8th  Edition - Pathologic stage from 01/11/2018: Stage IIIB (pT3, pN1c, cM0) - Signed by [name], MD on 01/16/2018    01/11/2018 Surgery    LEFT COLON RESECTION, TAKEDOWN SPLENIC FLEXURE, COLOSTOMY by Dr. [name]     01/11/2018 Procedure    Colonoscopy 01/11/18 by Dr. [name]  - Malignant completely obstructing tumor in the mid descending colon. Tattooed. - Diverticulosis in the sigmoid colon. - Non-bleeding internal hemorrhoids. - No specimens collected.    01/11/2018 Pathology Results    Diagnosis 01/11/18  1. Colon, segmental resection for tumor, descending colon - INVASIVE COLORECTAL ADENOCARCINOMA, 4 CM. - TUMOR EXTENDS INTO PERICOLONIC CONNECTIVE TISSUE. - TUMOR FOCALLY INVOLVES RADIAL MARGIN. - ONE MESENTERIC TUMOR DEPOSIT. - THIRTEEN BENIGN LYMPH NODES (0/13). 2. Colon, segmental resection, splenic flexure - BENIGN COLON. - NO EVIDENCE OF MALIGNANCY .    01/11/2018 Tumor Marker    Baseline CEA at 3.4    01/16/2018 Initial Diagnosis    Cancer of left colon ([place])    01/23/2018 Imaging    CT CHEST WO CONTRAST IMPRESSION: 1. No evidence for metastatic disease within the chest. 2. Small left pleural effusion with underlying opacities which may represent atelectasis. Right basilar atelectasis. 3. Few foci of gas within the upper abdomen in the omentum with surrounding fat stranding, likely postsurgical 4. Aortic Atherosclerosis (ICD10-I70.0).    03/08/2018 - 05/15/2018 Chemotherapy    adjuvant FOLOFX. Due to side effects of neuropahty Oxaliplatin was stopped after 3 cycles and chemo was stopped after 6 cycles. He declined completing 6 months of chemo treatment.      03/19/2018 Imaging    03/19/2018 CT AP IMPRESSION: 1. Interval partial left hemicolectomy and descending colostomy. 2. Heterogeneous soft tissue density along  the left anterior renal fascia is most likely postoperative (favor fat necrosis). No well-defined fluid collection. 3. Mild left lower quadrant edema, new since  01/10/2018. This could be postoperative. Superimposed sigmoid diverticulitis and/or cystitis cannot be excluded. 4. Subtle hyperenhancing nodule within the anterior bladder dome cannot be excluded. Consider nonemergent cystoscopy. When this is performed, recommend attention to the left ureterovesicular junction and distal left ureter to evaluate questionable soft tissue fullness. 5. Cholelithiasis. 6.  Aortic Atherosclerosis (ICD10-I70.0). 7. Prostatomegaly.      CURRENT THERAPY:  Surveillance  INTERVAL HISTORY:  Dave Vaughn is here for a follow up of colon cancer. He presents to the clinic today with his wife. He notes he is doing well. He has gone back to work and is exercising again. He has normal bowel movements, energy and eating. He plans to follow up with surgeon later today. Plans to see Urologist in 10/2018. He denies issues with urination. I reviewed medication list with him today.     REVIEW OF SYSTEMS:   Constitutional: Denies fevers, chills or abnormal weight loss Eyes: Denies blurriness of vision Ears, nose, mouth, throat, and face: Denies mucositis or sore throat Respiratory: Denies cough, dyspnea or wheezes Cardiovascular: Denies palpitation, chest discomfort or lower extremity swelling Gastrointestinal:  Denies nausea, heartburn or change in bowel habits Skin: Denies abnormal skin rashes Lymphatics: Denies new lymphadenopathy or easy bruising Neurological:Denies numbness, tingling or new weaknesses Behavioral/Psych: Mood is stable, no new changes  All other systems were reviewed with the patient and are negative.  MEDICAL HISTORY:  Past Medical History:  Diagnosis Date  . Adenocarcinoma, colon (Dave Vaughn)   . Anemia    taking iron supplements  . Atrial fibrillation with RVR (Dave Vaughn)   . Colonic obstruction (Dave Vaughn) 01/10/2018  . Diverticulitis   . Dysrhythmia    afib  . History of kidney stones     SURGICAL HISTORY: Past Surgical History:  Procedure  Laterality Date  . ANKLE SURGERY Left    when he was in college  . COLON RESECTION N/A 01/11/2018   Procedure: LEFT COLON RESECTION, TAKEDOWN SPLENIC FLEXURE, COLOSTOMY;  Surgeon: [name], MD;  Location: [facility];  Service: General;  Laterality: N/A;  . COLONOSCOPY  01/11/2018   Procedure: COLONOSCOPY;  Surgeon: Jackquline Denmark, MD;  Location: [facility];  Service: Endoscopy;;  . colonscopy  05/10/2018  . LYSIS OF ADHESION N/A 06/12/2018   Procedure: LYSIS OF ADHESIONS;  Surgeon: [name], MD;  Location: [facility];  Service: General;  Laterality: N/A;  . PORTACATH PLACEMENT Right 03/07/2018   Procedure: INSERTION PORT-A-CATH RIGHT SUBCLAVIAN;  Surgeon: [name], MD;  Location: [place] [facility];  Service: General;  Laterality: Right;  . PROCTOSCOPY N/A 06/12/2018   Procedure: RIGID PROCTOSCOPY;  Surgeon: [name], MD;  Location: [facility];  Service: General;  Laterality: N/A;  . thumb surgery   2018   cyst removal    I have reviewed the social history and family history with the patient and they are unchanged from previous note.  ALLERGIES:  has No Known Allergies.  MEDICATIONS:  Current Outpatient Medications  Medication Sig Dispense Refill  . acetaminophen (TYLENOL) 500 MG tablet Take 1,000 mg by mouth every 6 (six) hours as needed (for pain.).    [name] amiodarone (PACERONE) 200 MG tablet Take 1 tablet (200 mg total) by mouth 2 (two) times daily. 60 tablet 0  . apixaban (ELIQUIS) 5 MG TABS tablet Take 1 tablet (5 mg total) by mouth 2 (two) times daily. [place]  tablet 0  . b complex vitamins tablet Take 1 tablet by mouth daily.    [name] diltiazem (CARTIA XT) 240 MG 24 hr capsule Take 1 capsule (240 mg total) by mouth daily. (Patient taking differently: Take 240 mg by mouth at bedtime. ) 30 capsule 11  . lidocaine-prilocaine (EMLA) cream Apply to affected area once (Patient taking differently: Apply 1 application topically daily as needed (prior to port being accessed.). ) 30 g 3  . Multiple Vitamins-Iron  (MULTIVITAMIN/IRON PO) Take 1 tablet by mouth 2 (two) times daily.    . tamsulosin (FLOMAX) 0.4 MG CAPS capsule Take 0.4 mg by mouth 2 (two) times daily.     . traMADol (ULTRAM) 50 MG tablet Take 1-2 tablets (50-100 mg total) by mouth every 6 (six) hours as needed for moderate pain or severe pain (mild pain). 20 tablet 0  . ALPRAZolam (XANAX) 0.25 MG tablet Take 1 tablet (0.25 mg total) by mouth at bedtime as needed for anxiety. (Patient not taking: Reported on 08/14/2018) 20 tablet 0   No current facility-administered medications for this visit.     PHYSICAL EXAMINATION: ECOG PERFORMANCE STATUS: 0 - Asymptomatic  Vitals:   08/14/18 1315  BP: 132/79  Pulse: 89  Resp: 18  Temp: 98.4 F (36.9 C)  SpO2: 98%   Filed Weights   08/14/18 1315  Weight: 229 lb 11.2 oz (104.2 kg)    GENERAL:alert, no distress and comfortable SKIN: skin color, texture, turgor are normal, no rashes or significant lesions EYES: normal, Conjunctiva are pink and non-injected, sclera clear OROPHARYNX:no exudate, no erythema and lips, buccal mucosa, and tongue normal  NECK: supple, thyroid normal size, non-tender, without nodularity LYMPH:  no palpable lymphadenopathy in the cervical, axillary or inguinal LUNGS: clear to auscultation and percussion with normal breathing effort HEART: regular rate & rhythm and no murmurs and no lower extremity edema ABDOMEN:abdomen soft, non-tender and normal bowel sounds (+) Large midline scar healed completely with mild scar tissue Musculoskeletal:no cyanosis of digits and no clubbing  NEURO: alert & oriented x 3 with fluent speech, no focal motor/sensory deficits  LABORATORY DATA:  I have reviewed the data as listed CBC Latest Ref Rng & Units 08/14/2018 06/14/2018 06/13/2018  WBC 4.0 - 10.5 K/uL 5.9 - 14.5(H)  Hemoglobin 13.0 - 17.0 g/dL 13.0 11.5(L) 12.1(L)  Hematocrit 39.0 - 52.0 % 40.1 - 37.8(L)  Platelets 150 - 400 K/uL 226 - 199     CMP Latest Ref Rng & Units 08/14/2018  06/14/2018 06/13/2018  Glucose 70 - 99 mg/dL 99 - 136(H)  BUN 8 - 23 mg/dL 19 - 18  Creatinine 0.61 - 1.24 mg/dL 1.50(H) 1.28(H) 1.49(H)  Sodium 135 - 145 mmol/L 141 - 139  Potassium 3.5 - 5.1 mmol/L 4.4 4.0 4.8  Chloride 98 - 111 mmol/L 104 - 104  CO2 22 - 32 mmol/L 26 - 26  Calcium 8.9 - 10.3 mg/dL 9.2 - 8.8(L)  Total Protein 6.5 - 8.1 g/dL 7.2 - -  Total Bilirubin 0.3 - 1.2 mg/dL 0.5 - -  Alkaline Phos 38 - 126 U/L 88 - -  AST 15 - 41 U/L 25 - -  ALT 0 - 44 U/L 32 - -      RADIOGRAPHIC STUDIES: I have personally reviewed the radiological images as listed and agreed with the findings in the report. No results found.   ASSESSMENT & PLAN:  Dave Vaughn is a 73 y.o. male with   1. Cancer of left colon,adenocarcinoma, stage  IIIB(pT3N1cM0), MSI-stable -Diagnosed in 01/2018. Treated with surgery and adjuvant FOLOFX. Due to side effects Oxaliplatin was stopped after 3 cycles and chemo was stopped after 6 cycles. He declined completing 6 months of chemo treatment.  -I advised him to keep his port for a year due to risk of cancer recurrence. He agrees. We will keep it and flush every 6 weeks.  -He had a CT abdomen and pelvis with contrast on March 19, 2018, during his ED visit for hematuria, which showed no evidence of recurrence. I encouraged him to drink plenty of water and continue to follow up with urology.  -He overall has no residual effects from chemo -He is clinically doing well. Lab reviewed, her CBC WNL and CMP WNL except mildly elevated Cr. Iron panel and CEA is still pending. His physical exam unremarkable. There is no clinical concern for recurrence. -Due for colonoscopy with Dr. [name] Plan in 01/2019 -f/u in 3 months with a surveillance CT scan.    2. Atrial Fibrillation -continue Eliquis. Rate controlled.  3. Anemia, secondary to chemo -resolved now   4. CKD stage III -he has developed mild increased Cr since he started chemo, EGFR around 40-50's  -I encouraged him  to control his blood pressure, cholesterol and BG. I also encouraged him to drink plenty of water.  -Will avoid NSAIDs and CT IV contrast if GFR low  5. Abdominal wound, resolved  -Due to previous surgery has now completely healed. He will follow up with Dr. 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 Stopped Oxaliplatin after cycle 3 treatment  -Much improved since that he completed chemo.  Plan  -He is clinically doing well  -F/u in 3 months with lab, flush, and CT CAP with contrast a few days before. If his EGFR<55, will hold iv contrast  -Port lush in 6 weeks    No problem-specific Assessment & Plan notes found for this encounter.   Orders Placed This Encounter  Procedures  . CT Abdomen Pelvis W Contrast    No iv contrast if EGFR<55    Standing Status:   Future    Standing Expiration Date:   08/14/2019    Order Specific Question:   If indicated for the ordered procedure, I authorize the administration of contrast media per Radiology protocol    Answer:   Yes    Order Specific Question:   Preferred imaging location?    Answer:   [facility]    Order Specific Question:   Is Oral Contrast requested for this exam?    Answer:   Yes, Per Radiology protocol    Order Specific Question:   Radiology Contrast Protocol - do NOT remove file path    Answer:   \\[email or web address]  . CT Chest W Contrast    NO IV CONTRAST IF EGFR<55    Standing Status:   Future    Standing Expiration Date:   08/14/2019    Order Specific Question:   If indicated for the ordered procedure, I authorize the administration of contrast media per Radiology protocol    Answer:   Yes    Order Specific Question:   Preferred imaging location?    Answer:   [facility]    Order Specific Question:   Radiology Contrast Protocol - do NOT remove file path    Answer:   \\[email or web address]   All  questions were answered. The patient knows to call the clinic with any problems,  questions or concerns. No barriers to learning was detected. I spent 20 minutes counseling the patient face to face. The total time spent in the appointment was 25 minutes and more than 50% was on counseling and review of test results     [name], MD 08/14/2018   I, Joslyn Devon, am acting as scribe for [name], MD.   I have reviewed the above documentation for accuracy and completeness, and I agree with the above.

## 2018-08-14 ENCOUNTER — Inpatient Hospital Stay: Payer: 59 | Attending: Hematology

## 2018-08-14 ENCOUNTER — Telehealth: Payer: Self-pay | Admitting: Hematology

## 2018-08-14 ENCOUNTER — Encounter: Payer: Self-pay | Admitting: Hematology

## 2018-08-14 ENCOUNTER — Inpatient Hospital Stay (HOSPITAL_BASED_OUTPATIENT_CLINIC_OR_DEPARTMENT_OTHER): Payer: 59 | Admitting: Hematology

## 2018-08-14 ENCOUNTER — Inpatient Hospital Stay: Payer: 59

## 2018-08-14 VITALS — BP 132/79 | HR 89 | Temp 98.4°F | Resp 18 | Ht 71.0 in | Wt 229.7 lb

## 2018-08-14 DIAGNOSIS — Z85038 Personal history of other malignant neoplasm of large intestine: Secondary | ICD-10-CM | POA: Insufficient documentation

## 2018-08-14 DIAGNOSIS — I4891 Unspecified atrial fibrillation: Secondary | ICD-10-CM | POA: Insufficient documentation

## 2018-08-14 DIAGNOSIS — G620 Drug-induced polyneuropathy: Secondary | ICD-10-CM

## 2018-08-14 DIAGNOSIS — D490 Neoplasm of unspecified behavior of digestive system: Secondary | ICD-10-CM

## 2018-08-14 DIAGNOSIS — C186 Malignant neoplasm of descending colon: Secondary | ICD-10-CM

## 2018-08-14 DIAGNOSIS — N183 Chronic kidney disease, stage 3 (moderate): Secondary | ICD-10-CM | POA: Diagnosis not present

## 2018-08-14 DIAGNOSIS — Z95828 Presence of other vascular implants and grafts: Secondary | ICD-10-CM

## 2018-08-14 LAB — CBC WITH DIFFERENTIAL (CANCER CENTER ONLY)
Abs Immature Granulocytes: 0.01 10*3/uL (ref 0.00–0.07)
Basophils Absolute: 0 10*3/uL (ref 0.0–0.1)
Basophils Relative: 1 %
Eosinophils Absolute: 0.1 10*3/uL (ref 0.0–0.5)
Eosinophils Relative: 2 %
HCT: 40.1 % (ref 39.0–52.0)
HEMOGLOBIN: 13 g/dL (ref 13.0–17.0)
Immature Granulocytes: 0 %
Lymphocytes Relative: 33 %
Lymphs Abs: 1.9 10*3/uL (ref 0.7–4.0)
MCH: 30.6 pg (ref 26.0–34.0)
MCHC: 32.4 g/dL (ref 30.0–36.0)
MCV: 94.4 fL (ref 80.0–100.0)
Monocytes Absolute: 0.5 10*3/uL (ref 0.1–1.0)
Monocytes Relative: 8 %
NRBC: 0 % (ref 0.0–0.2)
Neutro Abs: 3.4 10*3/uL (ref 1.7–7.7)
Neutrophils Relative %: 56 %
Platelet Count: 226 10*3/uL (ref 150–400)
RBC: 4.25 MIL/uL (ref 4.22–5.81)
RDW: 13.5 % (ref 11.5–15.5)
WBC Count: 5.9 10*3/uL (ref 4.0–10.5)

## 2018-08-14 LAB — CEA (IN HOUSE-CHCC): CEA (CHCC-In House): 2.42 ng/mL (ref 0.00–5.00)

## 2018-08-14 LAB — COMPREHENSIVE METABOLIC PANEL
ALK PHOS: 88 U/L (ref 38–126)
ALT: 32 U/L (ref 0–44)
AST: 25 U/L (ref 15–41)
Albumin: 4 g/dL (ref 3.5–5.0)
Anion gap: 11 (ref 5–15)
BUN: 19 mg/dL (ref 8–23)
CO2: 26 mmol/L (ref 22–32)
CREATININE: 1.5 mg/dL — AB (ref 0.61–1.24)
Calcium: 9.2 mg/dL (ref 8.9–10.3)
Chloride: 104 mmol/L (ref 98–111)
GFR calc Af Amer: 54 mL/min — ABNORMAL LOW (ref >60)
GFR calc non Af Amer: 46 mL/min — ABNORMAL LOW (ref >60)
Glucose, Bld: 99 mg/dL (ref 70–99)
Potassium: 4.4 mmol/L (ref 3.5–5.1)
Sodium: 141 mmol/L (ref 135–145)
Total Bilirubin: 0.5 mg/dL (ref 0.3–1.2)
Total Protein: 7.2 g/dL (ref 6.5–8.1)

## 2018-08-14 LAB — IRON AND TIBC
Iron: 75 ug/dL (ref 42–163)
Saturation Ratios: 27 % (ref 20–55)
TIBC: 273 ug/dL (ref 202–409)
UIBC: 198 ug/dL (ref 117–376)

## 2018-08-14 LAB — FERRITIN: Ferritin: 69 ng/mL (ref 24–336)

## 2018-08-14 MED ORDER — HEPARIN SOD (PORK) LOCK FLUSH 100 UNIT/ML IV SOLN
500.0000 [IU] | Freq: Once | INTRAVENOUS | Status: AC | PRN
  Administered 2018-08-14: 500 [IU]
  Filled 2018-08-14: qty 5

## 2018-08-14 MED ORDER — SODIUM CHLORIDE 0.9% FLUSH
10.0000 mL | INTRAVENOUS | Status: DC | PRN
  Administered 2018-08-14: 10 mL
  Filled 2018-08-14: qty 10

## 2018-08-14 NOTE — Telephone Encounter (Signed)
Gave wife avs report and appointments for April and June. Central radiology will call re scan.

## 2018-09-25 ENCOUNTER — Other Ambulatory Visit: Payer: Self-pay

## 2018-09-25 ENCOUNTER — Inpatient Hospital Stay: Payer: 59 | Attending: Hematology

## 2018-09-25 DIAGNOSIS — Z95828 Presence of other vascular implants and grafts: Secondary | ICD-10-CM | POA: Insufficient documentation

## 2018-09-25 MED ORDER — SODIUM CHLORIDE 0.9% FLUSH
10.0000 mL | INTRAVENOUS | Status: DC | PRN
  Administered 2018-09-25: 10 mL
  Filled 2018-09-25: qty 10

## 2018-09-25 MED ORDER — HEPARIN SOD (PORK) LOCK FLUSH 100 UNIT/ML IV SOLN
500.0000 [IU] | Freq: Once | INTRAVENOUS | Status: AC | PRN
  Administered 2018-09-25: 14:00:00 500 [IU]
  Filled 2018-09-25: qty 5

## 2018-09-25 NOTE — Patient Instructions (Signed)

## 2018-11-13 ENCOUNTER — Inpatient Hospital Stay: Payer: 59

## 2018-11-13 ENCOUNTER — Inpatient Hospital Stay (HOSPITAL_BASED_OUTPATIENT_CLINIC_OR_DEPARTMENT_OTHER): Payer: 59 | Admitting: Hematology

## 2018-11-13 ENCOUNTER — Ambulatory Visit (HOSPITAL_COMMUNITY)
Admission: RE | Admit: 2018-11-13 | Discharge: 2018-11-13 | Disposition: A | Discharge status: Home / Self Care | Payer: 59 | Source: Ambulatory Visit | Attending: Hematology | Admitting: Hematology

## 2018-11-13 ENCOUNTER — Encounter: Payer: Self-pay | Admitting: Hematology

## 2018-11-13 ENCOUNTER — Telehealth: Payer: Self-pay | Admitting: Hematology

## 2018-11-13 ENCOUNTER — Other Ambulatory Visit: Payer: Self-pay | Admitting: Hematology

## 2018-11-13 ENCOUNTER — Inpatient Hospital Stay: Payer: 59 | Attending: Hematology

## 2018-11-13 ENCOUNTER — Other Ambulatory Visit: Payer: Self-pay

## 2018-11-13 ENCOUNTER — Encounter (HOSPITAL_COMMUNITY): Payer: Self-pay

## 2018-11-13 VITALS — BP 141/79 | HR 73 | Temp 98.7°F | Resp 18 | Ht 71.0 in | Wt 232.2 lb

## 2018-11-13 DIAGNOSIS — C186 Malignant neoplasm of descending colon: Secondary | ICD-10-CM

## 2018-11-13 DIAGNOSIS — Z95828 Presence of other vascular implants and grafts: Secondary | ICD-10-CM

## 2018-11-13 DIAGNOSIS — Z452 Encounter for adjustment and management of vascular access device: Secondary | ICD-10-CM | POA: Diagnosis not present

## 2018-11-13 DIAGNOSIS — D490 Neoplasm of unspecified behavior of digestive system: Secondary | ICD-10-CM

## 2018-11-13 DIAGNOSIS — N183 Chronic kidney disease, stage 3 unspecified: Secondary | ICD-10-CM

## 2018-11-13 DIAGNOSIS — I482 Chronic atrial fibrillation, unspecified: Secondary | ICD-10-CM

## 2018-11-13 DIAGNOSIS — I4891 Unspecified atrial fibrillation: Secondary | ICD-10-CM | POA: Diagnosis not present

## 2018-11-13 DIAGNOSIS — Z7901 Long term (current) use of anticoagulants: Secondary | ICD-10-CM

## 2018-11-13 DIAGNOSIS — Z85038 Personal history of other malignant neoplasm of large intestine: Secondary | ICD-10-CM | POA: Diagnosis not present

## 2018-11-13 DIAGNOSIS — G620 Drug-induced polyneuropathy: Secondary | ICD-10-CM

## 2018-11-13 LAB — CMP (CANCER CENTER ONLY)
ALT: 26 U/L (ref 0–44)
AST: 25 U/L (ref 15–41)
Albumin: 4 g/dL (ref 3.5–5.0)
Alkaline Phosphatase: 80 U/L (ref 38–126)
Anion gap: 9 (ref 5–15)
BUN: 21 mg/dL (ref 8–23)
CO2: 28 mmol/L (ref 22–32)
Calcium: 9 mg/dL (ref 8.9–10.3)
Chloride: 105 mmol/L (ref 98–111)
Creatinine: 1.5 mg/dL — ABNORMAL HIGH (ref 0.61–1.24)
GFR, Est AFR Am: 54 mL/min — ABNORMAL LOW (ref >60)
GFR, Estimated: 46 mL/min — ABNORMAL LOW (ref >60)
Glucose, Bld: 99 mg/dL (ref 70–99)
Potassium: 4 mmol/L (ref 3.5–5.1)
Sodium: 142 mmol/L (ref 135–145)
Total Bilirubin: 0.5 mg/dL (ref 0.3–1.2)
Total Protein: 6.8 g/dL (ref 6.5–8.1)

## 2018-11-13 LAB — CBC WITH DIFFERENTIAL (CANCER CENTER ONLY)
Abs Immature Granulocytes: 0.01 10*3/uL (ref 0.00–0.07)
Basophils Absolute: 0 10*3/uL (ref 0.0–0.1)
Basophils Relative: 1 %
Eosinophils Absolute: 0.1 10*3/uL (ref 0.0–0.5)
Eosinophils Relative: 1 %
HCT: 40.8 % (ref 39.0–52.0)
Hemoglobin: 13.2 g/dL (ref 13.0–17.0)
Immature Granulocytes: 0 %
Lymphocytes Relative: 24 %
Lymphs Abs: 1.3 10*3/uL (ref 0.7–4.0)
MCH: 30.3 pg (ref 26.0–34.0)
MCHC: 32.4 g/dL (ref 30.0–36.0)
MCV: 93.8 fL (ref 80.0–100.0)
Monocytes Absolute: 0.4 10*3/uL (ref 0.1–1.0)
Monocytes Relative: 7 %
Neutro Abs: 3.5 10*3/uL (ref 1.7–7.7)
Neutrophils Relative %: 67 %
Platelet Count: 179 10*3/uL (ref 150–400)
RBC: 4.35 MIL/uL (ref 4.22–5.81)
RDW: 13.7 % (ref 11.5–15.5)
WBC Count: 5.2 10*3/uL (ref 4.0–10.5)
nRBC: 0 % (ref 0.0–0.2)

## 2018-11-13 LAB — IRON AND TIBC
Iron: 74 ug/dL (ref 42–163)
Saturation Ratios: 26 % (ref 20–55)
TIBC: 289 ug/dL (ref 202–409)
UIBC: 215 ug/dL (ref 117–376)

## 2018-11-13 LAB — FERRITIN: Ferritin: 108 ng/mL (ref 24–336)

## 2018-11-13 LAB — CEA (IN HOUSE-CHCC): CEA (CHCC-In House): 3.06 ng/mL (ref 0.00–5.00)

## 2018-11-13 MED ORDER — HEPARIN SOD (PORK) LOCK FLUSH 100 UNIT/ML IV SOLN
500.0000 [IU] | Freq: Once | INTRAVENOUS | Status: AC | PRN
  Administered 2018-11-13: 500 [IU]
  Filled 2018-11-13: qty 5

## 2018-11-13 MED ORDER — SODIUM CHLORIDE (PF) 0.9 % IJ SOLN
INTRAMUSCULAR | Status: AC
  Filled 2018-11-13: qty 50

## 2018-11-13 MED ORDER — HEPARIN SOD (PORK) LOCK FLUSH 100 UNIT/ML IV SOLN
INTRAVENOUS | Status: AC
  Filled 2018-11-13: qty 5

## 2018-11-13 MED ORDER — SODIUM CHLORIDE 0.9% FLUSH
10.0000 mL | INTRAVENOUS | Status: DC | PRN
  Administered 2018-11-13: 10 mL
  Filled 2018-11-13: qty 10

## 2018-11-13 MED ORDER — IOHEXOL 300 MG/ML  SOLN: 75.0000 mL | Freq: Once | INTRAMUSCULAR | Status: DC | PRN

## 2018-11-13 NOTE — Progress Notes (Signed)
Dave Vaughn   [phone] Fax:([phone]) [phone]   Clinic Follow up Note   Patient Care Team: [name], MD as PCP - General (Family Medicine) [name], MD as Consulting Physician (Cardiology) [name], MD as Consulting Physician (Gastroenterology) [name], MD as Consulting Physician (General Surgery) [name], MD as Consulting Physician (General Surgery) [name], MD as Consulting Physician (Urology) Dave Merle, MD as Consulting Physician (Medical Oncology)  Date of Service:  11/13/2018  CHIEF COMPLAINT: F/u on colon cancer  SUMMARY OF ONCOLOGIC HISTORY: Oncology History   Cancer Staging Cancer of left colon [facility]) Staging form: Colon and Rectum, AJCC 8th Edition - Pathologic stage from 01/11/2018: Stage IIIB (pT3, pN1c, cM0) - Signed by Dave Merle, MD on 01/16/2018       Cancer of left colon ([place])   01/10/2018 Imaging    CT AP W Contrast 01/10/18  IMPRESSION: Irregular soft tissue density causing stricture of the mid descending colon likely the site of obstruction for the dilated small bowel. 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 No evidence of perforation.  Equivocal findings involving the appendix measuring 1.2 cm at the appendiceal tip with mucosal enhancement. No adjacent free fluid or inflammatory change. Findings are nonspecific, but can be seen with early acute appendicitis.  Mild prostatic enlargement. Increased density over the posterior bladder base likely due to the large prostatic impression although cannot completely exclude a bladder mass. Urology protocol CT or ultrasound may be helpful for better evaluation.  Mild cholelithiasis.  Stable 1.5 cm cystic structure over the lower pole right kidney likely slightly hyperdense cyst.  Diverticulosis of the colon.  Aortic Atherosclerosis (ICD10-I70.0).      01/11/2018 Cancer Staging    Staging form: Colon and Rectum, AJCC 8th  Edition - Pathologic stage from 01/11/2018: Stage IIIB (pT3, pN1c, cM0) - Signed by Dave Merle, MD on 01/16/2018    01/11/2018 Surgery    LEFT COLON RESECTION, TAKEDOWN SPLENIC FLEXURE, COLOSTOMY by Dr. [name]     01/11/2018 Procedure    Colonoscopy 01/11/18 by Dr. [name]  - Malignant completely obstructing tumor in the mid descending colon. Tattooed. - Diverticulosis in the sigmoid colon. - Non-bleeding internal hemorrhoids. - No specimens collected.    01/11/2018 Pathology Results    Diagnosis 01/11/18  1. Colon, segmental resection for tumor, descending colon - INVASIVE COLORECTAL ADENOCARCINOMA, 4 CM. - TUMOR EXTENDS INTO PERICOLONIC CONNECTIVE TISSUE. - TUMOR FOCALLY INVOLVES RADIAL MARGIN. - ONE MESENTERIC TUMOR DEPOSIT. - THIRTEEN BENIGN LYMPH NODES (0/13). 2. Colon, segmental resection, splenic flexure - BENIGN COLON. - NO EVIDENCE OF MALIGNANCY .    01/11/2018 Tumor Marker    Baseline CEA at 3.4    01/16/2018 Initial Diagnosis    Cancer of left colon ([place])    01/23/2018 Imaging    CT CHEST WO CONTRAST IMPRESSION: 1. No evidence for metastatic disease within the chest. 2. Small left pleural effusion with underlying opacities which may represent atelectasis. Right basilar atelectasis. 3. Few foci of gas within the upper abdomen in the omentum with surrounding fat stranding, likely postsurgical 4. Aortic Atherosclerosis (ICD10-I70.0).    03/08/2018 - 05/15/2018 Chemotherapy    adjuvant FOLOFX. Due to side effects of neuropahty Oxaliplatin was stopped after 3 cycles and chemo was stopped after 6 cycles. He declined completing 6 months of chemo treatment.      03/19/2018 Imaging    03/19/2018 CT AP IMPRESSION: 1. Interval partial left hemicolectomy and descending colostomy. 2. Heterogeneous soft tissue density along  the left anterior renal fascia is most likely postoperative (favor fat necrosis). No well-defined fluid collection. 3. Mild left lower quadrant edema, new since  01/10/2018. This could be postoperative. Superimposed sigmoid diverticulitis and/or cystitis cannot be excluded. 4. Subtle hyperenhancing nodule within the anterior bladder dome cannot be excluded. Consider nonemergent cystoscopy. When this is performed, recommend attention to the left ureterovesicular junction and distal left ureter to evaluate questionable soft tissue fullness. 5. Cholelithiasis. 6.  Aortic Atherosclerosis (ICD10-I70.0). 7. Prostatomegaly.    11/13/2018 Imaging    CT CAP WO Contrast 11/13/18  IMPRESSION: 1. Reversal of left lower quadrant colostomy with sigmoid colon anastomosis. No complicating features. No findings for residual or recurrent tumor or metastatic disease involving the chest, abdomen or pelvis without contrast. 2. No acute abdominal/pelvic findings. 3. Gallbladder sludge and gallstones but no findings for acute cholecystitis. 4. Stable anterior abdominal wall hernia. 5. The right testicle is in the right inguinal canal.      CURRENT THERAPY:  Surveillance  INTERVAL HISTORY:  Dave Vaughn is here for a follow up of colon cancer. He presents to the clinic alone. He notes he is doing well. He is able to exercise with walking and lifting weight. He is eating well. He had colostomy reversal in 06/2018. He has a hernia repair to do but will wait due to COVID-19. He still has PAC in place. He notes his BMs are doing well. He denies chest discomfort, SOB or any pain. He still sees his other physicians. I reviewed his medication list with him.   REVIEW OF SYSTEMS:   Constitutional: Denies fevers, chills or abnormal weight loss Eyes: Denies blurriness of vision Ears, nose, mouth, throat, and face: Denies mucositis or sore throat Respiratory: Denies cough, dyspnea or wheezes Cardiovascular: Denies palpitation, chest discomfort or lower extremity swelling Gastrointestinal:  Denies nausea, heartburn or change in bowel habits Skin: Denies abnormal skin  rashes Lymphatics: Denies new lymphadenopathy or easy bruising Neurological:Denies numbness, tingling or new weaknesses Behavioral/Psych: Mood is stable, no new changes  All other systems were reviewed with the patient and are negative.  MEDICAL HISTORY:  Past Medical History:  Diagnosis Date   Adenocarcinoma, colon ([place]) dx'd 01/2018   Anemia    taking iron supplements   Atrial fibrillation with RVR ([facility])    Colonic obstruction ([place]) 01/10/2018   Diverticulitis    Dysrhythmia    afib   History of kidney stones     SURGICAL HISTORY: Past Surgical History:  Procedure Laterality Date   ANKLE SURGERY Left    when he was in college   COLON RESECTION N/A 01/11/2018   Procedure: LEFT COLON RESECTION, TAKEDOWN SPLENIC FLEXURE, COLOSTOMY;  Surgeon: [name], MD;  Location: [facility];  Service: General;  Laterality: N/A;   COLONOSCOPY  01/11/2018   Procedure: COLONOSCOPY;  Surgeon: Jackquline Denmark, MD;  Location: [facility];  Service: Endoscopy;;   colonscopy  05/10/2018   LYSIS OF ADHESION N/A 06/12/2018   Procedure: LYSIS OF ADHESIONS;  Surgeon: [name], MD;  Location: [facility];  Service: General;  Laterality: N/A;   PORTACATH PLACEMENT Right 03/07/2018   Procedure: INSERTION PORT-A-CATH RIGHT SUBCLAVIAN;  Surgeon: [name], MD;  Location: [facility];  Service: General;  Laterality: Right;   PROCTOSCOPY N/A 06/12/2018   Procedure: RIGID PROCTOSCOPY;  Surgeon: [name], MD;  Location: [facility];  Service: General;  Laterality: N/A;   thumb surgery   2018   cyst removal    I have reviewed the social  history and family history with the patient and they are unchanged from previous note.  ALLERGIES:  has No Known Allergies.  MEDICATIONS:  Current Outpatient Medications  Medication Sig Dispense Refill   amiodarone (PACERONE) 200 MG tablet Take 1 tablet (200 mg total) by mouth 2 (two) times daily. 60 tablet 0   apixaban (ELIQUIS) 5 MG TABS tablet Take 1 tablet (5 mg  total) by mouth 2 (two) times daily. 60 tablet 0   b complex vitamins tablet Take 1 tablet by mouth daily.     lidocaine-prilocaine (EMLA) cream Apply to affected area once (Patient taking differently: Apply 1 application topically daily as needed (prior to port being accessed.). ) 30 g 3   Multiple Vitamins-Iron (MULTIVITAMIN/IRON PO) Take 1 tablet by mouth 2 (two) times daily.     tamsulosin (FLOMAX) 0.4 MG CAPS capsule Take 0.4 mg by mouth 2 (two) times daily.      No current facility-administered medications for this visit.    Facility-Administered Medications Ordered in Other Visits  Medication Dose Route Frequency Provider Last Rate Last Dose   heparin lock flush 100 UNIT/ML injection            iohexol (OMNIPAQUE) 300 MG/ML solution 75 mL  75 mL Intravenous Once PRN Dave Merle, MD       sodium chloride (PF) 0.9 % injection             PHYSICAL EXAMINATION: ECOG PERFORMANCE STATUS: 0 - Asymptomatic  Vitals:   11/13/18 1328  BP: (!) 141/79  Pulse: 73  Resp: 18  Temp: 98.7 F (37.1 C)  SpO2: 99%   Filed Weights   11/13/18 1328  Weight: 232 lb 3.2 oz (105.3 kg)    GENERAL:alert, no distress and comfortable SKIN: skin color, texture, turgor are normal, no rashes or significant lesions EYES: normal, Conjunctiva are pink and non-injected, sclera clear  NECK: supple, thyroid normal size, non-tender, without nodularity LYMPH:  no palpable lymphadenopathy in the cervical, axillary  LUNGS: clear to auscultation and percussion with normal breathing effort HEART: regular rate & rhythm and no murmurs and no lower extremity edema ABDOMEN:abdomen soft, non-tender and normal bowel sounds Musculoskeletal:no cyanosis of digits and no clubbing  NEURO: alert & oriented x 3 with fluent speech, no focal motor/sensory deficits  LABORATORY DATA:  I have reviewed the data as listed CBC Latest Ref Rng & Units 11/13/2018 08/14/2018 06/14/2018  WBC 4.0 - 10.5 K/uL 5.2 5.9 -  Hemoglobin 13.0  - 17.0 g/dL 13.2 13.0 11.5(L)  Hematocrit 39.0 - 52.0 % 40.8 40.1 -  Platelets 150 - 400 K/uL 179 226 -     CMP Latest Ref Rng & Units 11/13/2018 08/14/2018 06/14/2018  Glucose 70 - 99 mg/dL 99 99 -  BUN 8 - 23 mg/dL 21 19 -  Creatinine 0.61 - 1.24 mg/dL 1.50(H) 1.50(H) 1.28(H)  Sodium 135 - 145 mmol/L 142 141 -  Potassium 3.5 - 5.1 mmol/L 4.0 4.4 4.0  Chloride 98 - 111 mmol/L 105 104 -  CO2 22 - 32 mmol/L 28 26 -  Calcium 8.9 - 10.3 mg/dL 9.0 9.2 -  Total Protein 6.5 - 8.1 g/dL 6.8 7.2 -  Total Bilirubin 0.3 - 1.2 mg/dL 0.5 0.5 -  Alkaline Phos 38 - 126 U/L 80 88 -  AST 15 - 41 U/L 25 25 -  ALT 0 - 44 U/L 26 32 -      RADIOGRAPHIC STUDIES: I have personally reviewed the radiological images as listed and  agreed with the findings in the report. Ct Abdomen Pelvis Wo Contrast  Result Date: 11/13/2018 CLINICAL DATA:  Restaging colon cancer. EXAM: CT CHEST, ABDOMEN AND PELVIS WITHOUT CONTRAST TECHNIQUE: Multidetector CT imaging of the chest, abdomen and pelvis was performed following the standard protocol without IV contrast. COMPARISON:  CT scan 03/19/2018 FINDINGS: CT CHEST FINDINGS Cardiovascular: The heart is normal in size. No pericardial effusion. The aorta is normal in caliber. Minimal scattered atherosclerotic calcifications for age. Minimal scattered coronary artery calcifications. Mediastinum/Nodes: No mediastinal or hilar mass or lymphadenopathy. Small scattered lymph nodes are stable. The thyroid gland is grossly normal. Esophagus is unremarkable. Lungs/Pleura: No worrisome pulmonary lesions or nodules to suggest metastatic disease. No acute pulmonary findings or pleural effusion. No interstitial lung disease or bronchiectasis. Musculoskeletal: No significant bony findings. Moderate degenerative changes involving the spine. No chest wall lesions or supraclavicular or axillary adenopathy. The right-sided Port-A-Cath is in good position. CT ABDOMEN PELVIS FINDINGS Hepatobiliary: No hepatic  lesions are identified without IV contrast. No intrahepatic biliary dilatation. The gallbladder demonstrates gallbladder sludge and gallstones but no findings for acute cholecystitis. No common bile duct dilatation. Pancreas: No mass, inflammation or ductal dilatation. Spleen: Normal size.  No focal lesions. Adrenals/Urinary Tract: The adrenal glands and kidneys are unremarkable without contrast. There is a stable lower pole right renal cyst and a lower pole left renal calculus. The bladder is unremarkable. Stomach/Bowel: The stomach, duodenum, small bowel and colon are unremarkable. No acute inflammatory changes, mass lesions or obstructive findings. The terminal ileum and appendix are normal. Surgical changes from reversal of a left lower quadrant colostomy. Sigmoid colon anastomosis without complicating features. Rectum is unremarkable. No findings for residual or recurrent tumor. Vascular/Lymphatic: The aorta is normal in caliber. Minimal scattered atherosclerotic calcifications. No mesenteric or retroperitoneal mass or adenopathy. No pelvic adenopathy. Reproductive: Enlarged prostate gland with median lobe hypertrophy impressing on the base of the bladder. The seminal vesicles appear normal. Other: 3.8 cm soft tissue mass in the right inguinal area is most likely the patient's right testicle which has moved up into the inguinal canal. No left-sided inguinal hernia. Musculoskeletal: Advanced degenerative changes involving the lumbar spine but no worrisome bone lesions. IMPRESSION: 1. Reversal of left lower quadrant colostomy with sigmoid colon anastomosis. No complicating features. No findings for residual or recurrent tumor or metastatic disease involving the chest, abdomen or pelvis without contrast. 2. No acute abdominal/pelvic findings. 3. Gallbladder sludge and gallstones but no findings for acute cholecystitis. 4. Stable anterior abdominal wall hernia. 5. The right testicle is in the right inguinal canal.  Electronically Signed   By: Marijo Sanes M.D.   On: 11/13/2018 13:27   Ct Chest Wo Contrast  Result Date: 11/13/2018 CLINICAL DATA:  Restaging colon cancer. EXAM: CT CHEST, ABDOMEN AND PELVIS WITHOUT CONTRAST TECHNIQUE: Multidetector CT imaging of the chest, abdomen and pelvis was performed following the standard protocol without IV contrast. COMPARISON:  CT scan 03/19/2018 FINDINGS: CT CHEST FINDINGS Cardiovascular: The heart is normal in size. No pericardial effusion. The aorta is normal in caliber. Minimal scattered atherosclerotic calcifications for age. Minimal scattered coronary artery calcifications. Mediastinum/Nodes: No mediastinal or hilar mass or lymphadenopathy. Small scattered lymph nodes are stable. The thyroid gland is grossly normal. Esophagus is unremarkable. Lungs/Pleura: No worrisome pulmonary lesions or nodules to suggest metastatic disease. No acute pulmonary findings or pleural effusion. No interstitial lung disease or bronchiectasis. Musculoskeletal: No significant bony findings. Moderate degenerative changes involving the spine. No chest wall lesions or supraclavicular or  axillary adenopathy. The right-sided Port-A-Cath is in good position. CT ABDOMEN PELVIS FINDINGS Hepatobiliary: No hepatic lesions are identified without IV contrast. No intrahepatic biliary dilatation. The gallbladder demonstrates gallbladder sludge and gallstones but no findings for acute cholecystitis. No common bile duct dilatation. Pancreas: No mass, inflammation or ductal dilatation. Spleen: Normal size.  No focal lesions. Adrenals/Urinary Tract: The adrenal glands and kidneys are unremarkable without contrast. There is a stable lower pole right renal cyst and a lower pole left renal calculus. The bladder is unremarkable. Stomach/Bowel: The stomach, duodenum, small bowel and colon are unremarkable. No acute inflammatory changes, mass lesions or obstructive findings. The terminal ileum and appendix are normal.  Surgical changes from reversal of a left lower quadrant colostomy. Sigmoid colon anastomosis without complicating features. Rectum is unremarkable. No findings for residual or recurrent tumor. Vascular/Lymphatic: The aorta is normal in caliber. Minimal scattered atherosclerotic calcifications. No mesenteric or retroperitoneal mass or adenopathy. No pelvic adenopathy. Reproductive: Enlarged prostate gland with median lobe hypertrophy impressing on the base of the bladder. The seminal vesicles appear normal. Other: 3.8 cm soft tissue mass in the right inguinal area is most likely the patient's right testicle which has moved up into the inguinal canal. No left-sided inguinal hernia. Musculoskeletal: Advanced degenerative changes involving the lumbar spine but no worrisome bone lesions. IMPRESSION: 1. Reversal of left lower quadrant colostomy with sigmoid colon anastomosis. No complicating features. No findings for residual or recurrent tumor or metastatic disease involving the chest, abdomen or pelvis without contrast. 2. No acute abdominal/pelvic findings. 3. Gallbladder sludge and gallstones but no findings for acute cholecystitis. 4. Stable anterior abdominal wall hernia. 5. The right testicle is in the right inguinal canal. Electronically Signed   By: Marijo Sanes M.D.   On: 11/13/2018 13:27     ASSESSMENT & PLAN:  YADRIEL KERRIGAN is a 73 y.o. male with   1. Cancer of left colon,adenocarcinoma, stage IIIB(pT3N1cM0), MSI-stable -Diagnosed in 01/2018. Treated with surgery andadjuvantFOLFOX. Due to side effects Oxaliplatin was stopped after 3 cycles and chemo was stopped after 6 cycles. He declined completing 6 months of chemo treatment.  -I advised him to keep his port for a year due torisk ofcancer recurrence. He agrees. We will keep it and flush every 6-8 weeks due to COVID-19.  -We discussed his CT CAP from today which showed surgical change, no findings of residual or metastatic disease. There was  incidental findings of gallbladder stones and sluggish gall bladder. CT was done without iv contrast due to his CKD. I reviewed the scan images in person.  -He also has abdominal hernia which he plans to get repaired. -He is clinically doing well. Labs reviewed, CBC and CMP WNL except Cr 1.50. CEA WNL and iron panel still pending. He will complete labs for his cardiologist here.  -He is almost 1 year since diagnosis. Will continue surveillance. -Due for colonoscopy with Dr. [name] Plan in 01/2019 -I encouraged him to continue being active and eat healthy.  -f/u in 4 months   2. Atrial Fibrillation -continue Eliquis. Rate controlled. -He will continue to follow up with his cardiologist.   3.CKD stage III -he has developed mild increased Cr since he started chemo, EGFR around 40-50's  -I encouraged him to control his blood pressure, cholesterol and BG. I also encouraged him to drink plenty of water.  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 Stopped Oxaliplatin after cycle 3 treatment  -Much improved since that he completed chemo. -Not mentioned today, likely resolved now.   Plan -CT CAP reviewed, NED. He is clinically doing well  -Port flush in 2 months -Lab, flush and f/u in 4 months    No problem-specific Assessment & Plan notes found for this encounter.   No orders of the defined types were placed in this encounter.  All questions were answered. The patient knows to call the clinic with any problems, questions or concerns. No barriers to learning was detected. I spent 20 minutes counseling the patient face to face. The total time spent in the appointment was 25 minutes and more than 50% was on counseling and review of test results     Dave Merle, MD 11/13/2018   I, Joslyn Devon, am acting as scribe for Dave Merle, MD.   I have reviewed the above  documentation for accuracy and completeness, and I agree with the above.

## 2018-11-13 NOTE — Telephone Encounter (Signed)
Spoke with patient per MD scheduling message, patient was okay with moving his appt to 12:45 on 6/8.  Patient aware of appt date and time,

## 2018-11-13 NOTE — Addendum Note (Signed)
Addended by: Carlene Coria L on: 11/13/2018 01:54 PM   Modules accepted: Orders

## 2018-11-14 ENCOUNTER — Telehealth: Payer: Self-pay | Admitting: Hematology

## 2018-11-14 ENCOUNTER — Telehealth: Payer: Self-pay

## 2018-11-14 NOTE — Telephone Encounter (Signed)
Scheduled appt per 6/8 los. A calendar will be mailed out. °

## 2018-11-14 NOTE — Telephone Encounter (Signed)
TC to patient per Lacie to let him know that his iron panel and tumor marker are normal. And to Continue multivitamin. No further problems or concerns at this time.

## 2018-11-16 ENCOUNTER — Ambulatory Visit: Payer: 59 | Admitting: Hematology

## 2019-01-04 ENCOUNTER — Ambulatory Visit: Payer: Self-pay | Admitting: General Surgery

## 2019-01-04 NOTE — H&P (Signed)
History of Present Illness Dave Ok MD; 01/04/2019 10:05 AM) The patient is a 73 year old male who presents with an incisional hernia. Patient is a 73 year old male who is referred male by Dr. [name] for a ventral incisional hernia. Patient has a history of A. fib and sees Dr. [name] and his an Sanda Klein was. Patient underwent exploratory laparotomy, left colon resection and colostomy in August 2019. Subsequent to this patient underwent robotic partial colectomy and colostomy takedown in January 2020. Patient states that he noticed the hernia after his initial operation. Patient states that he has had some discomfort and pain. He states that after his second operation he did notice that the hernia appeared to enlarge. He states that he does not have overt pain however he has some discomfort. The patient is still active, and works. 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  Patient had a CT scan in January 2020. This revealed a large ventral incisional hernia with approximately 11.5 cm width. Patient's rectus muscles were on the right side 8 cm and 6 cm on the left.  ------------------------------------ This is an extremely nice 73 year old man who presents for evaluation of a large ventral incisional hernia. Dr. Burr Medico is his oncologist. Dr. [name] is his cardiologist. Dr. [name] Plan is his gastroenterologist. Dr. Lovena Neighbours is his urologist.  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 He underwent urgent laparotomy, left colon resection, takedown splenic flexure, end colostomy. 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 13 lymph nodes were negative but there was a positive mesenteric peritoneal deposit. Final stage IIIB. I placed a Port-A-Cath on March 07, 2018.  He has completed his chemotherapy.  On June 12, 2018 16 blood  performed robotic lysis of adhesions with colostomy takedown. He recovered from that surgery uneventfully. He thinks he's had a ventral hernia since before that operation. It is now perhaps larger and bothers him and he would like to get it repaired if he can. His health is good. His attitude is excellent. He remains on Eliquis because of atrial fibrillation.  He had a restaging CT scan recently and there was no evidence of cancer. The rectus muscles looked relatively intact but they are widely separated by 14-15 cm. Physical exam is consistent with that CT finding  I told him that this could be repaired but would likely require a long midline incision with excision of the old scar and a transverse abdominis release technique which I described to him. I advised referral to my partner, Dr. Rosendo Gros, who specializes in this type of surgery. He agreed but asked that I assist in the surgery and I told him that I would be happy to.  He is going to see Dr. Rosendo Gros on July 20   Allergies Dave Vaughn, [place]; 01/04/2019 9:48 AM) No Known Drug Allergies  [01/26/2018]: Allergies Reconciled   Medication History Dave Vaughn, CMA; 01/04/2019 9:49 AM) Levothyroxine Sodium (100MCG Tablet, Oral) Active. Tamsulosin HCl (0.4MG Capsule, Oral) Active. Amiodarone HCl (200MG Tablet, Oral) Active. DilTIAZem HCl ER Coated Beads (240MG Capsule ER 24HR, Oral) Active. Eliquis (5MG Tablet, Oral) Active. Atorvastatin Calcium (40MG Tablet, Oral) Active. Medications Reconciled  Vitals Dave Vaughn CMA; 01/04/2019 9:48 AM) 01/04/2019 9:48 AM Weight: 230.8 lb Height: 70in Body Surface Area: 2.22 m Body Mass Index: 33.12 kg/m  Temp.: 97.58F  Pulse: 87 (Regular)  BP: 122/72(Sitting, Left Arm, Standard)  Physical Exam Dave Ok MD; 01/04/2019 10:05 AM) General Mental Status-Alert. General Appearance-Not in acute distress. Build & Nutrition-Well  nourished. Posture-Normal posture. Gait-Normal.  Head and Neck Head-normocephalic, atraumatic with no lesions or palpable masses. Trachea-midline. Thyroid Gland Characteristics - normal size and consistency and no palpable nodules.  Chest and Lung Exam Chest and lung exam reveals -on auscultation, normal breath sounds, no adventitious sounds and normal vocal resonance.  Cardiovascular Note: Irregular rhythm. Normal rate.   Abdomen Note: Somewhat overweight with BMI 33. Midline incision with wide scar well-healed. [place] incisional hernia with fascial defect at least 13-14 cm. I don't feel a defect at the colostomy site on the left side but that looks like it there may be this beginnings of a hernia there on the CT scan. Nontender. No organomegaly   Neurologic Neurologic evaluation reveals -alert and oriented x 3 with no impairment of recent or remote memory, normal attention span and ability to concentrate, normal sensation and normal coordination.  Musculoskeletal Normal Exam - Bilateral-Upper Extremity Strength Normal and Lower Extremity Strength Normal.    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 Patient has a history of A. fib an Eliquis. Patient sees Dr. [name]. We'll obtain a clearance to be off his antiplatelet medication prior to scheduling surgery.  1. Patient will begin candidate for exporter laparotomy, lysis of adhesions and likely either retrorectus vs TAR hernia repair with mesh. I discussed the procedure in detail with the patient. 2. All risks and benefits were discussed with the patient to generally include, but not limited to: infection, bleeding, damage to surrounding structures, acute and chronic nerve pain, and recurrence. Alternatives were offered and described. All questions were  answered and the patient voiced understanding of the procedure and wishes to proceed at this point with hernia repair.  Current Plans Schedule for Surgery  CANCER OF DESCENDING COLON (C18.6) Story: Cancer of left colon,adenocarcinoma, stage IIIB(pT3N1cM0), MSI-stable  Open colectomy/colostomy Hartmann resection 01/11/2017.  Chemotherapy starting 03/07/2018   ANTICOAGULATED (Z79.01) Impression: eliquis   ATRIAL FIBRILLATION, NEW ONSET (I48.91)   HISTORY OF COLOSTOMY REVERSAL (Z98.890)

## 2019-01-09 ENCOUNTER — Other Ambulatory Visit (HOSPITAL_COMMUNITY): Payer: Self-pay | Admitting: Cardiology

## 2019-01-09 ENCOUNTER — Other Ambulatory Visit: Payer: Self-pay | Admitting: Cardiology

## 2019-01-09 DIAGNOSIS — R079 Chest pain, unspecified: Secondary | ICD-10-CM

## 2019-01-15 ENCOUNTER — Telehealth: Payer: Self-pay | Admitting: Hematology

## 2019-01-15 ENCOUNTER — Inpatient Hospital Stay: Payer: 59 | Attending: Hematology

## 2019-01-15 NOTE — Telephone Encounter (Signed)
Called pt per 8/10 sch message- no answer. Left message for patient to call back for reschedule.

## 2019-01-17 ENCOUNTER — Ambulatory Visit (HOSPITAL_COMMUNITY)
Admission: RE | Admit: 2019-01-17 | Discharge: 2019-01-17 | Disposition: A | Discharge status: Home / Self Care | Payer: Commercial Managed Care - PPO | Source: Ambulatory Visit | Attending: Cardiology | Admitting: Cardiology

## 2019-01-17 ENCOUNTER — Other Ambulatory Visit: Payer: Self-pay

## 2019-01-17 DIAGNOSIS — R079 Chest pain, unspecified: Secondary | ICD-10-CM | POA: Insufficient documentation

## 2019-01-17 MED ORDER — REGADENOSON 0.4 MG/5ML IV SOLN
0.4000 mg | Freq: Once | INTRAVENOUS | Status: AC
  Administered 2019-01-17: 12:00:00 0.4 mg via INTRAVENOUS

## 2019-01-17 MED ORDER — TECHNETIUM TC 99M TETROFOSMIN IV KIT
30.0000 | PACK | Freq: Once | INTRAVENOUS | Status: AC | PRN
  Administered 2019-01-17: 12:00:00 30 via INTRAVENOUS

## 2019-01-17 MED ORDER — REGADENOSON 0.4 MG/5ML IV SOLN
INTRAVENOUS | Status: AC
  Filled 2019-01-17: qty 5

## 2019-01-17 MED ORDER — TECHNETIUM TC 99M TETROFOSMIN IV KIT
10.0000 | PACK | Freq: Once | INTRAVENOUS | Status: AC | PRN
  Administered 2019-01-17: 10 via INTRAVENOUS

## 2019-02-22 NOTE — Progress Notes (Addendum)
[facility] [place], [address] AT [address] [place] [address] Phone: [phone] Fax: [phone]       Your procedure is scheduled on September 25th.  Report to [facility] Main Entrance "A" at 5:30 A.M., and check in at the Admitting office.  Call this number if you have problems the morning of surgery:  [phone]  Call [phone] if you have any questions prior to your surgery date Monday-Friday 8am-4pm    Remember:  Do not eat after midnight the night before your surgery  You may drink clear liquids until 4:30 AM the morning of your surgery.   Clear liquids allowed are: Water, Non-Citrus Juices (without pulp), Carbonated Beverages, Clear Tea, Black Coffee Only, and Gatorade    Take these medicines the morning of surgery with A SIP OF WATER   Amiodarone (Pacerone)   Follow your surgeon's instructions on when to stop Eliquis.  If no instructions were given by your surgeon then you will need to call the office to get those instructions.    7 days prior to surgery STOP taking any Aspirin (unless otherwise instructed by your surgeon), Aleve, Naproxen, Ibuprofen, Motrin, Advil, Goody's, BC's, all herbal medications, fish oil, and all vitamins.    The Morning of Surgery  Do not wear jewelry.  Do not wear lotions, powders, colognes, or deodorant  Do not shave 48 hours prior to surgery.  Men may shave face and neck.  Do not bring valuables to the hospital.  [facility] is not responsible for any belongings or valuables.  If you are a smoker, DO NOT Smoke 24 hours prior to surgery IF you wear a CPAP at night please bring your mask, tubing, and machine the morning of surgery   Remember that you must have someone to transport you home after your surgery, and remain with you for 24 hours if you are discharged the same day.   Contacts, glasses, hearing aids, dentures or bridgework may not be  worn into surgery.    Leave your suitcase in the car.  After surgery it may be brought to your room.  For patients admitted to the hospital, discharge time will be determined by your treatment team.  Patients discharged the day of surgery will not be allowed to drive home.    Special instructions:   [place]- Preparing For Surgery  Before surgery, you can play an important role. Because skin is not sterile, your skin needs to be as free of germs as possible. You can reduce the number of germs on your skin by washing with CHG (chlorahexidine gluconate) Soap before surgery.  CHG is an antiseptic cleaner which kills germs and bonds with the skin to continue killing germs even after washing.    Oral Hygiene is also important to reduce your risk of infection.  Remember - BRUSH YOUR TEETH THE MORNING OF SURGERY WITH YOUR REGULAR TOOTHPASTE  Please do not use if you have an allergy to CHG or antibacterial soaps. If your skin becomes reddened/irritated stop using the CHG.  Do not shave (including legs and underarms) for at least 48 hours prior to first CHG shower. It is OK to shave your face.  Please follow these instructions carefully.   1. Shower the NIGHT BEFORE SURGERY and the MORNING OF SURGERY with CHG Soap.   2. If you chose to wash your hair, wash your hair first as usual with your normal  shampoo.  3. After you shampoo, rinse your hair and body thoroughly to remove the shampoo.  4. Use CHG as you would any other liquid soap. You can apply CHG directly to the skin and wash gently with a scrungie or a clean washcloth.   5. Apply the CHG Soap to your body ONLY FROM THE NECK DOWN.  Do not use on open wounds or open sores. Avoid contact with your eyes, ears, mouth and genitals (private parts). Wash Face and genitals (private parts)  with your normal soap.   6. Wash thoroughly, paying special attention to the area where your surgery will be performed.  7. Thoroughly rinse your body  with warm water from the neck down.  8. DO NOT shower/wash with your normal soap after using and rinsing off the CHG Soap.  9. Pat yourself dry with a CLEAN TOWEL.  10. Wear CLEAN PAJAMAS to bed the night before surgery, wear comfortable clothes the morning of surgery  11. Place CLEAN SHEETS on your bed the night of your first shower and DO NOT SLEEP WITH PETS.    Day of Surgery:  Do not apply any deodorants/lotions. Please shower the morning of surgery with the CHG soap  Please wear clean clothes to the hospital/surgery center.   Remember to brush your teeth WITH YOUR REGULAR TOOTHPASTE.   Please read over the following fact sheets that you were given.

## 2019-02-23 ENCOUNTER — Encounter (HOSPITAL_COMMUNITY): Payer: Self-pay

## 2019-02-23 ENCOUNTER — Other Ambulatory Visit: Payer: Self-pay

## 2019-02-23 ENCOUNTER — Encounter (HOSPITAL_COMMUNITY)
Admission: RE | Admit: 2019-02-23 | Discharge: 2019-02-23 | Disposition: A | Discharge status: Home / Self Care | Payer: Commercial Managed Care - PPO | Source: Ambulatory Visit | Attending: General Surgery | Admitting: General Surgery

## 2019-02-23 DIAGNOSIS — K4091 Unilateral inguinal hernia, without obstruction or gangrene, recurrent: Secondary | ICD-10-CM | POA: Diagnosis not present

## 2019-02-23 DIAGNOSIS — Z01812 Encounter for preprocedural laboratory examination: Secondary | ICD-10-CM | POA: Diagnosis not present

## 2019-02-23 HISTORY — DX: Anxiety disorder, unspecified: F41.9

## 2019-02-23 HISTORY — DX: Depression, unspecified: F32.A

## 2019-02-23 LAB — CBC
HCT: 43.7 % (ref 39.0–52.0)
Hemoglobin: 14.7 g/dL (ref 13.0–17.0)
MCH: 32 pg (ref 26.0–34.0)
MCHC: 33.6 g/dL (ref 30.0–36.0)
MCV: 95 fL (ref 80.0–100.0)
Platelets: 174 10*3/uL (ref 150–400)
RBC: 4.6 MIL/uL (ref 4.22–5.81)
RDW: 12.1 % (ref 11.5–15.5)
WBC: 5.6 10*3/uL (ref 4.0–10.5)
nRBC: 0 % (ref 0.0–0.2)

## 2019-02-23 NOTE — Progress Notes (Signed)
PCP - Micheline Rough Cardiologist - Dr. [name]  Chest x-ray - N/A EKG - n/a ECHO - 2019  Blood Thinner Instructions: Follow your surgeon's instructions on when to stop Eliquis   ERAS Protcol - yes PRE-SURGERY Ensure - no drink ordered  COVID TEST- Tuesday, 02-27-19   Anesthesia review: yes, heart history  Patient denies shortness of breath, fever, cough and chest pain at PAT appointment   Patient verbalized understanding of instructions that were given to them at the PAT appointment. Patient was also instructed that they will need to review over the PAT instructions again at home before surgery.

## 2019-02-26 NOTE — Progress Notes (Addendum)
Anesthesia Chart Review:  Case: [id number] Date/Time: 03/02/19 0715   Procedures:      EXPLORATORY LAPAROTOMY (N/A )     INCISIONAL HERNIA REPAIR WITH MESH, LYSIS OF ADHESIONS, RECTORECTUS VS TAR HERNIA REPAIR WITH MESH (N/A )   Anesthesia type: General   Pre-op diagnosis: RECURRENT INGUINAL HERNIA   Location: [facility] OR ROOM 02 / [place] [facility]   Surgeon: Ralene Ok, MD      DISCUSSION: Patient is a 73 year old male scheduled for the above procedure.   History includes never smoker, colon cancer (presented with obstructing mass, s/p left colectomy, end colostomy 01/11/18, pathology: invasive adenocarcinoma; s/p chemotherapy 10/1/9-12/9/19; s/p robotic assisted partial colectomy with colostomy takedown 06/12/18), anemia, afib (new onset 01/11/18), right [place] vein Port-a-cath 03/07/18.  He did not report a history of CKD, but labs have mostly shown an elevated Cr of ~ 1.3-1.60 since 04/2018, and mostly ~ 1.50. He will need a preoperative BMET. Also PT/INR. (Per posting, patient to hold Eliquis for 3 days prior to surgery.)  Posting indicates that patient has been cleared for surgery. I am still awaiting clearance note and last cardiology records. He had an intermediate risk stress test in 01/2019.     ADDENDUM 02/27/19 11:01 AM: I received records from Dr. [name].  He reviewed recent stress test findings small area of apical ischemic wtihout ischemic changes on EKG. Patient without anginal symptoms. Options discussed, and ultimately they reached a decision to treat medically for now and add low-dose isosorbide mononitrate and low dose ASA, continue other cardiac medications. "Okay to stop eliquis 3 days prior to ventral incisional hernia surgery."   02/27/19 COVID-19 test is in process. He is for BMET and PT/INR on arrival the day of surgery. If these results all acceptable and otherwise no acute changes then I would anticipate that he can proceed as planned.   VS: BP 123/77   Pulse 70   Temp 36.6 C   Resp  20   Ht 6' (1.829 m)   Wt 105 kg   SpO2 95%   BMI 31.38 kg/m    PROVIDERS: [name], MD is PCP [name], MD is cardiologist [name], MD is HEM-ONC Lucio Edward, MD is GI  Ellison Hughs, MD is GU    LABS: CBC WNL. He needs a preoperative BMET and PT/INR. (all labs ordered are listed, but only abnormal results are displayed)  Labs Reviewed  CBC     IMAGES: CT chest/abd/pelvis 11/13/18: IMPRESSION: 1. Reversal of left lower quadrant colostomy with sigmoid colon anastomosis. No complicating features. No findings for residual or recurrent tumor or metastatic disease involving the chest, abdomen or pelvis without contrast. 2. No acute abdominal/pelvic findings. 3. Gallbladder sludge and gallstones but no findings for acute cholecystitis. 4. Stable anterior abdominal wall hernia. 5. The right testicle is in the right inguinal canal.  CXR 03/07/18: IMPRESSION: Right Port-A-Cath tip in the SVC. No pneumothorax. No active cardiopulmonary disease.   EKG: Not done at PAT. Last EKG requested from Dr. [name]; however, he does have an 01/17/19 pre-test EKG in [facility] that was done as part of his stress test. Results showed afib at 67 bpm poor r wave progression in V1-3.   Received 11/13/18 EKG from Dr. Zenia Resides office: Afib at 62 bpm. LAD. Low voltage QRS.   CV: Nuclear stress test 01/17/19: IMPRESSION: 1. Small region of mild to moderate reversible ischemia in the apical segment anterior wall. 2. Normal left ventricular wall motion. 3. Left ventricular  ejection fraction 47% 4. Non invasive risk stratification*: Intermediate *2012 Appropriate Use Criteria for Coronary Revascularization Focused Update: J Am Coll Cardiol. N6492421. http://content.airportbarriers.com.aspx?articleid=1201161   Echo 01/12/18: Study Conclusions - Left ventricle: The cavity size was normal. Systolic function was   normal. The estimated ejection fraction was in  the range of 55%   to 60%. Wall motion was normal; there were no regional wall   motion abnormalities. - Mitral valve: There was moderate regurgitation. - Left atrium: The atrium was mildly to moderately dilated. - Right ventricle: The cavity size was mildly dilated.   Past Medical History:  Diagnosis Date  . Adenocarcinoma, colon ([place]) dx'd 01/2018  . Anemia    taking iron supplements  . Anxiety   . Atrial fibrillation with RVR ([place])   . Colonic obstruction ([place]) 01/10/2018  . Depression   . Diverticulitis   . Dysrhythmia    afib  . History of kidney stones     Past Surgical History:  Procedure Laterality Date  . ANKLE SURGERY Left    when he was in college  . COLON RESECTION N/A 01/11/2018   Procedure: LEFT COLON RESECTION, TAKEDOWN SPLENIC FLEXURE, COLOSTOMY;  Surgeon: [name], MD;  Location: [facility];  Service: General;  Laterality: N/A;  . COLONOSCOPY  01/11/2018   Procedure: COLONOSCOPY;  Surgeon: Jackquline Denmark, MD;  Location: [facility];  Service: Endoscopy;;  . colonscopy  05/10/2018  . LYSIS OF ADHESION N/A 06/12/2018   Procedure: LYSIS OF ADHESIONS;  Surgeon: [name], MD;  Location: [facility];  Service: General;  Laterality: N/A;  . PORTACATH PLACEMENT Right 03/07/2018   Procedure: INSERTION PORT-A-CATH RIGHT SUBCLAVIAN;  Surgeon: [name], MD;  Location: [facility];  Service: General;  Laterality: Right;  . PROCTOSCOPY N/A 06/12/2018   Procedure: RIGID PROCTOSCOPY;  Surgeon: [name], MD;  Location: [facility];  Service: General;  Laterality: N/A;  . thumb surgery   2018   cyst removal    MEDICATIONS: . amiodarone (PACERONE) 200 MG tablet  . apixaban (ELIQUIS) 5 MG TABS tablet  . atorvastatin (LIPITOR) 40 MG tablet  . b complex vitamins tablet  . diltiazem (CARDIZEM CD) 240 MG 24 hr capsule  . docusate sodium (COLACE) 100 MG capsule  . isosorbide mononitrate (IMDUR) 30 MG 24 hr tablet  . levothyroxine (SYNTHROID) 100 MCG tablet  . lidocaine-prilocaine (EMLA)  cream  . Multiple Vitamins-Iron (MULTIVITAMIN/IRON PO)  . tamsulosin (FLOMAX) 0.4 MG CAPS capsule   No current facility-administered medications for this encounter.     Myra Gianotti, PA-C Surgical Short Stay/Anesthesiology [facility] Phone [phone] [facility] Phone [phone] 02/26/2019 6:34 PM

## 2019-02-27 ENCOUNTER — Other Ambulatory Visit (HOSPITAL_COMMUNITY)
Admission: RE | Admit: 2019-02-27 | Discharge: 2019-02-27 | Disposition: A | Discharge status: Home / Self Care | Payer: Commercial Managed Care - PPO | Source: Ambulatory Visit | Attending: General Surgery | Admitting: General Surgery

## 2019-02-27 DIAGNOSIS — Z01812 Encounter for preprocedural laboratory examination: Secondary | ICD-10-CM | POA: Diagnosis not present

## 2019-02-27 DIAGNOSIS — Z20828 Contact with and (suspected) exposure to other viral communicable diseases: Secondary | ICD-10-CM | POA: Insufficient documentation

## 2019-02-27 NOTE — Anesthesia Preprocedure Evaluation (Addendum)
Anesthesia Evaluation  Patient identified by MRN, date of birth, ID band Patient awake    Reviewed: Allergy & Precautions, NPO status , Patient's Chart, lab work & pertinent test results  History of Anesthesia Complications Negative for: history of anesthetic complications  Airway Mallampati: IV  TM Distance: >3 FB Neck ROM: Full    Dental  (+) Dental Advisory Given, Missing,    Pulmonary neg pulmonary ROS,    Pulmonary exam normal        Cardiovascular + dysrhythmias Atrial Fibrillation + Valvular Problems/Murmurs MR  Rhythm:Irregular Rate:Normal   '19 TTE - EF 55% to 60%. Moderate MR. LA was mildly to moderately dilated. RV cavity size was mildly dilated.  From PAT - records from Dr. [name]... He reviewed recent stress test findings small area of apical ischemic wtihout ischemic changes on EKG. Patient without anginal symptoms. Options discussed, and ultimately they reached a decision to treat medically for now and add low-dose isosorbide mononitrate and low dose ASA, continue other cardiac medications. "Okay to stop eliquis 3 days prior to ventral incisional hernia surgery."    Neuro/Psych PSYCHIATRIC DISORDERS Anxiety Depression negative neurological ROS     GI/Hepatic Neg liver ROS,  Hx colonic obstruction, adenocarcinoma of colon    Endo/Other  Hypothyroidism  Obesity   Renal/GU CRFRenal disease     Musculoskeletal negative musculoskeletal ROS (+)   Abdominal   Peds  Hematology negative hematology ROS (+)   Anesthesia Other Findings   Reproductive/Obstetrics                            Anesthesia Physical Anesthesia Plan  ASA: III  Anesthesia Plan: General   Post-op Pain Management:    Induction: Intravenous  PONV Risk Score and Plan: 4 or greater and Treatment may vary due to age or medical condition, Ondansetron and Dexamethasone  Airway Management Planned: Oral ETT  and Video Laryngoscope Planned  Additional Equipment: None  Intra-op Plan:   Post-operative Plan: Extubation in OR  Informed Consent: I have reviewed the patients History and Physical, chart, labs and discussed the procedure including the risks, benefits and alternatives for the proposed anesthesia with the patient or authorized representative who has indicated his/her understanding and acceptance.     Dental advisory given  Plan Discussed with: CRNA and Anesthesiologist  Anesthesia Plan Comments:       Anesthesia Quick Evaluation

## 2019-02-28 LAB — NOVEL CORONAVIRUS, NAA (HOSP ORDER, SEND-OUT TO REF LAB; TAT 18-24 HRS): SARS-CoV-2, NAA: NOT DETECTED

## 2019-03-02 ENCOUNTER — Inpatient Hospital Stay (HOSPITAL_COMMUNITY): Payer: Commercial Managed Care - PPO | Admitting: Vascular Surgery

## 2019-03-02 ENCOUNTER — Other Ambulatory Visit: Payer: Self-pay

## 2019-03-02 ENCOUNTER — Inpatient Hospital Stay (HOSPITAL_COMMUNITY)
Admission: RE | Admit: 2019-03-02 | Discharge: 2019-03-03 | DRG: 355 | Disposition: A | Discharge status: Home / Self Care | Payer: Commercial Managed Care - PPO | Attending: General Surgery | Admitting: General Surgery

## 2019-03-02 ENCOUNTER — Encounter (HOSPITAL_COMMUNITY)
Admission: RE | Disposition: A | Discharge status: Home / Self Care | Payer: Self-pay | Source: Home / Self Care | Attending: General Surgery

## 2019-03-02 ENCOUNTER — Encounter (HOSPITAL_COMMUNITY): Payer: Self-pay

## 2019-03-02 DIAGNOSIS — I4891 Unspecified atrial fibrillation: Secondary | ICD-10-CM | POA: Diagnosis present

## 2019-03-02 DIAGNOSIS — K432 Incisional hernia without obstruction or gangrene: Secondary | ICD-10-CM | POA: Diagnosis present

## 2019-03-02 DIAGNOSIS — Z8719 Personal history of other diseases of the digestive system: Secondary | ICD-10-CM

## 2019-03-02 DIAGNOSIS — Z7901 Long term (current) use of anticoagulants: Secondary | ICD-10-CM | POA: Diagnosis not present

## 2019-03-02 DIAGNOSIS — Z20828 Contact with and (suspected) exposure to other viral communicable diseases: Secondary | ICD-10-CM | POA: Diagnosis present

## 2019-03-02 DIAGNOSIS — Z9221 Personal history of antineoplastic chemotherapy: Secondary | ICD-10-CM

## 2019-03-02 HISTORY — PX: HERNIA REPAIR: SHX51

## 2019-03-02 HISTORY — PX: LYSIS OF ADHESION: SHX5961

## 2019-03-02 HISTORY — PX: INCISIONAL HERNIA REPAIR: SHX193

## 2019-03-02 HISTORY — PX: LAPAROTOMY: SHX154

## 2019-03-02 HISTORY — PX: INSERTION OF MESH: SHX5868

## 2019-03-02 LAB — BASIC METABOLIC PANEL
Anion gap: 10 (ref 5–15)
BUN: 15 mg/dL (ref 8–23)
CO2: 26 mmol/L (ref 22–32)
Calcium: 9.1 mg/dL (ref 8.9–10.3)
Chloride: 103 mmol/L (ref 98–111)
Creatinine, Ser: 1.39 mg/dL — ABNORMAL HIGH (ref 0.61–1.24)
GFR calc Af Amer: 58 mL/min — ABNORMAL LOW (ref >60)
GFR calc non Af Amer: 50 mL/min — ABNORMAL LOW (ref >60)
Glucose, Bld: 102 mg/dL — ABNORMAL HIGH (ref 70–99)
Potassium: 4 mmol/L (ref 3.5–5.1)
Sodium: 139 mmol/L (ref 135–145)

## 2019-03-02 LAB — PROTIME-INR
INR: 1.1 (ref 0.8–1.2)
Prothrombin Time: 13.6 seconds (ref 11.4–15.2)

## 2019-03-02 SURGERY — LAPAROTOMY, EXPLORATORY
Anesthesia: General | Site: Abdomen

## 2019-03-02 MED ORDER — LIDOCAINE 2% (20 MG/ML) 5 ML SYRINGE
INTRAMUSCULAR | Status: AC
  Filled 2019-03-02: qty 5

## 2019-03-02 MED ORDER — GABAPENTIN 300 MG PO CAPS
300.0000 mg | ORAL_CAPSULE | ORAL | Status: AC
  Administered 2019-03-02: 06:00:00 300 mg via ORAL

## 2019-03-02 MED ORDER — PHENYLEPHRINE 40 MCG/ML (10ML) SYRINGE FOR IV PUSH (FOR BLOOD PRESSURE SUPPORT)
PREFILLED_SYRINGE | INTRAVENOUS | Status: AC
  Filled 2019-03-02: qty 10

## 2019-03-02 MED ORDER — FENTANYL CITRATE (PF) 250 MCG/5ML IJ SOLN
INTRAMUSCULAR | Status: DC | PRN
  Administered 2019-03-02 (×5): 50 ug via INTRAVENOUS

## 2019-03-02 MED ORDER — SUCCINYLCHOLINE CHLORIDE 200 MG/10ML IV SOSY
PREFILLED_SYRINGE | INTRAVENOUS | Status: AC
  Filled 2019-03-02: qty 10

## 2019-03-02 MED ORDER — DOCUSATE SODIUM 100 MG PO CAPS
100.0000 mg | ORAL_CAPSULE | Freq: Two times a day (BID) | ORAL | Status: DC
  Administered 2019-03-02 – 2019-03-03 (×2): 100 mg via ORAL
  Filled 2019-03-02 (×2): qty 1

## 2019-03-02 MED ORDER — CELECOXIB 200 MG PO CAPS
200.0000 mg | ORAL_CAPSULE | ORAL | Status: AC
  Administered 2019-03-02: 06:00:00 200 mg via ORAL

## 2019-03-02 MED ORDER — ROCURONIUM BROMIDE 10 MG/ML (PF) SYRINGE
PREFILLED_SYRINGE | INTRAVENOUS | Status: AC
  Filled 2019-03-02: qty 10

## 2019-03-02 MED ORDER — STERILE WATER FOR IRRIGATION IR SOLN
Status: DC | PRN
  Administered 2019-03-02: 1000 mL

## 2019-03-02 MED ORDER — ONDANSETRON HCL 4 MG/2ML IJ SOLN: 4.0000 mg | Freq: Four times a day (QID) | INTRAMUSCULAR | Status: DC | PRN

## 2019-03-02 MED ORDER — VISTASEAL 10 ML SINGLE DOSE KIT
10.0000 mL | PACK | Freq: Once | CUTANEOUS | Status: DC
  Filled 2019-03-02 (×2): qty 10

## 2019-03-02 MED ORDER — BUPIVACAINE LIPOSOME 1.3 % IJ SUSP
20.0000 mL | Freq: Once | INTRAMUSCULAR | Status: DC
  Filled 2019-03-02: qty 20

## 2019-03-02 MED ORDER — LIDOCAINE 2% (20 MG/ML) 5 ML SYRINGE
INTRAMUSCULAR | Status: DC | PRN
  Administered 2019-03-02: 60 mg via INTRAVENOUS

## 2019-03-02 MED ORDER — AMIODARONE HCL 200 MG PO TABS
200.0000 mg | ORAL_TABLET | Freq: Every day | ORAL | Status: DC
  Administered 2019-03-03: 200 mg via ORAL
  Filled 2019-03-02: qty 1

## 2019-03-02 MED ORDER — CHLORHEXIDINE GLUCONATE CLOTH 2 % EX PADS
6.0000 | MEDICATED_PAD | Freq: Once | CUTANEOUS | Status: AC
  Administered 2019-03-02: 6 via TOPICAL

## 2019-03-02 MED ORDER — CEFAZOLIN SODIUM-DEXTROSE 2-4 GM/100ML-% IV SOLN
INTRAVENOUS | Status: AC
  Filled 2019-03-02: qty 100

## 2019-03-02 MED ORDER — MIDAZOLAM HCL 2 MG/2ML IJ SOLN
INTRAMUSCULAR | Status: AC
  Filled 2019-03-02: qty 2

## 2019-03-02 MED ORDER — SODIUM CHLORIDE 0.9 % IV SOLN
INTRAVENOUS | Status: DC | PRN
  Administered 2019-03-02: 20 mL

## 2019-03-02 MED ORDER — DILTIAZEM HCL ER COATED BEADS 240 MG PO CP24
240.0000 mg | ORAL_CAPSULE | Freq: Every day | ORAL | Status: DC
  Administered 2019-03-02 – 2019-03-03 (×2): 240 mg via ORAL
  Filled 2019-03-02 (×2): qty 1

## 2019-03-02 MED ORDER — B COMPLEX PO TABS: 1.0000 | ORAL_TABLET | Freq: Every day | ORAL | Status: DC

## 2019-03-02 MED ORDER — VISTASEAL 10 ML SINGLE DOSE KIT
PACK | CUTANEOUS | Status: DC | PRN
  Administered 2019-03-02: 10 mL via TOPICAL

## 2019-03-02 MED ORDER — FENTANYL CITRATE (PF) 250 MCG/5ML IJ SOLN
INTRAMUSCULAR | Status: AC
  Filled 2019-03-02: qty 5

## 2019-03-02 MED ORDER — ACETAMINOPHEN 500 MG PO TABS
1000.0000 mg | ORAL_TABLET | ORAL | Status: AC
  Administered 2019-03-02: 06:00:00 1000 mg via ORAL

## 2019-03-02 MED ORDER — ENOXAPARIN SODIUM 40 MG/0.4ML ~~LOC~~ SOLN
40.0000 mg | SUBCUTANEOUS | Status: DC
  Filled 2019-03-02: qty 0.4

## 2019-03-02 MED ORDER — DEXAMETHASONE SODIUM PHOSPHATE 10 MG/ML IJ SOLN
INTRAMUSCULAR | Status: DC | PRN
  Administered 2019-03-02: 4 mg via INTRAVENOUS

## 2019-03-02 MED ORDER — CEFAZOLIN SODIUM-DEXTROSE 2-4 GM/100ML-% IV SOLN
2.0000 g | INTRAVENOUS | Status: AC
  Administered 2019-03-02: 2 g via INTRAVENOUS

## 2019-03-02 MED ORDER — LACTATED RINGERS IV SOLN
INTRAVENOUS | Status: DC
  Administered 2019-03-02 (×2): via INTRAVENOUS

## 2019-03-02 MED ORDER — INFLUENZA VAC A&B SA ADJ QUAD 0.5 ML IM PRSY
0.5000 mL | PREFILLED_SYRINGE | INTRAMUSCULAR | Status: DC
  Filled 2019-03-02: qty 0.5

## 2019-03-02 MED ORDER — TAMSULOSIN HCL 0.4 MG PO CAPS
0.4000 mg | ORAL_CAPSULE | Freq: Two times a day (BID) | ORAL | Status: DC
  Administered 2019-03-02 – 2019-03-03 (×2): 0.4 mg via ORAL
  Filled 2019-03-02 (×2): qty 1

## 2019-03-02 MED ORDER — ISOSORBIDE MONONITRATE ER 30 MG PO TB24
30.0000 mg | ORAL_TABLET | Freq: Every day | ORAL | Status: DC
  Administered 2019-03-02 – 2019-03-03 (×2): 30 mg via ORAL
  Filled 2019-03-02 (×2): qty 1

## 2019-03-02 MED ORDER — GABAPENTIN 300 MG PO CAPS
ORAL_CAPSULE | ORAL | Status: AC
  Administered 2019-03-02: 06:00:00 300 mg via ORAL
  Filled 2019-03-02: qty 1

## 2019-03-02 MED ORDER — ACETAMINOPHEN 500 MG PO TABS
ORAL_TABLET | ORAL | Status: AC
  Administered 2019-03-02: 1000 mg via ORAL
  Filled 2019-03-02: qty 2

## 2019-03-02 MED ORDER — PROPOFOL 10 MG/ML IV BOLUS
INTRAVENOUS | Status: AC
  Filled 2019-03-02: qty 40

## 2019-03-02 MED ORDER — ACETAMINOPHEN 500 MG PO TABS
1000.0000 mg | ORAL_TABLET | Freq: Four times a day (QID) | ORAL | Status: DC
  Administered 2019-03-02 – 2019-03-03 (×4): 1000 mg via ORAL
  Filled 2019-03-02 (×5): qty 2

## 2019-03-02 MED ORDER — CELECOXIB 200 MG PO CAPS
ORAL_CAPSULE | ORAL | Status: AC
  Administered 2019-03-02: 200 mg via ORAL
  Filled 2019-03-02: qty 1

## 2019-03-02 MED ORDER — PROPOFOL 10 MG/ML IV BOLUS
INTRAVENOUS | Status: DC | PRN
  Administered 2019-03-02: 150 mg via INTRAVENOUS

## 2019-03-02 MED ORDER — EPHEDRINE 5 MG/ML INJ
INTRAVENOUS | Status: AC
  Filled 2019-03-02: qty 10

## 2019-03-02 MED ORDER — SILVER NITRATE-POT NITRATE 75-25 % EX MISC
1.0000 | Freq: Once | CUTANEOUS | Status: DC
  Filled 2019-03-02: qty 1

## 2019-03-02 MED ORDER — MIDAZOLAM HCL 5 MG/5ML IJ SOLN
INTRAMUSCULAR | Status: DC | PRN
  Administered 2019-03-02: 1 mg via INTRAVENOUS

## 2019-03-02 MED ORDER — SODIUM CHLORIDE 0.9 % IV SOLN
INTRAVENOUS | Status: DC | PRN
  Administered 2019-03-02: 25 ug/min via INTRAVENOUS

## 2019-03-02 MED ORDER — ONDANSETRON HCL 4 MG/2ML IJ SOLN
INTRAMUSCULAR | Status: DC | PRN
  Administered 2019-03-02: 4 mg via INTRAVENOUS

## 2019-03-02 MED ORDER — LEVOTHYROXINE SODIUM 100 MCG PO TABS
100.0000 ug | ORAL_TABLET | Freq: Every day | ORAL | Status: DC
  Administered 2019-03-03: 100 ug via ORAL
  Filled 2019-03-02: qty 1

## 2019-03-02 MED ORDER — ONDANSETRON 4 MG PO TBDP: 4.0000 mg | ORAL_TABLET | Freq: Four times a day (QID) | ORAL | Status: DC | PRN

## 2019-03-02 MED ORDER — ROCURONIUM BROMIDE 10 MG/ML (PF) SYRINGE
PREFILLED_SYRINGE | INTRAVENOUS | Status: DC | PRN
  Administered 2019-03-02: 40 mg via INTRAVENOUS
  Administered 2019-03-02: 20 mg via INTRAVENOUS
  Administered 2019-03-02: 50 mg via INTRAVENOUS
  Administered 2019-03-02: 10 mg via INTRAVENOUS

## 2019-03-02 MED ORDER — SUGAMMADEX SODIUM 200 MG/2ML IV SOLN
INTRAVENOUS | Status: DC | PRN
  Administered 2019-03-02: 210 mg via INTRAVENOUS

## 2019-03-02 MED ORDER — 0.9 % SODIUM CHLORIDE (POUR BTL) OPTIME
TOPICAL | Status: DC | PRN
  Administered 2019-03-02: 07:00:00 1000 mL

## 2019-03-02 MED ORDER — TRAMADOL HCL 50 MG PO TABS
50.0000 mg | ORAL_TABLET | Freq: Four times a day (QID) | ORAL | Status: DC | PRN
  Administered 2019-03-02: 50 mg via ORAL
  Filled 2019-03-02: qty 1

## 2019-03-02 SURGICAL SUPPLY — 69 items
ADH SKN CLS APL DERMABOND .7 (GAUZE/BANDAGES/DRESSINGS) ×1
APL PRP STRL LF DISP 70% ISPRP (MISCELLANEOUS) ×1
BLADE CLIPPER SURG (BLADE) ×1 IMPLANT
BLADE SURG 11 STRL SS (BLADE) ×2 IMPLANT
CANISTER SUCT 3000ML PPV (MISCELLANEOUS) ×2 IMPLANT
CHLORAPREP W/TINT 26 (MISCELLANEOUS) ×2 IMPLANT
COVER SURGICAL LIGHT HANDLE (MISCELLANEOUS) ×2 IMPLANT
COVER WAND RF STERILE (DRAPES) ×2 IMPLANT
DERMABOND ADVANCED (GAUZE/BANDAGES/DRESSINGS) ×1
DERMABOND ADVANCED .7 DNX12 (GAUZE/BANDAGES/DRESSINGS) IMPLANT
DEVICE TROCAR PUNCTURE CLOSURE (ENDOMECHANICALS) ×1 IMPLANT
DRAIN CHANNEL 19F RND (DRAIN) IMPLANT
DRAPE INCISE IOBAN 66X45 STRL (DRAPES) ×1 IMPLANT
DRAPE LAPAROSCOPIC ABDOMINAL (DRAPES) ×2 IMPLANT
DRAPE WARM FLUID 44X44 (DRAPES) ×2 IMPLANT
DRSG OPSITE 6X11 MED (GAUZE/BANDAGES/DRESSINGS) ×1 IMPLANT
DRSG OPSITE POSTOP 4X10 (GAUZE/BANDAGES/DRESSINGS) ×1 IMPLANT
DRSG OPSITE POSTOP 4X8 (GAUZE/BANDAGES/DRESSINGS) ×1 IMPLANT
ELECT BLADE 4.0 EZ CLEAN MEGAD (MISCELLANEOUS) ×2
ELECT BLADE 6.5 EXT (BLADE) IMPLANT
ELECT CAUTERY BLADE 6.4 (BLADE) ×2 IMPLANT
ELECT REM PT RETURN 9FT ADLT (ELECTROSURGICAL) ×2
ELECTRODE BLDE 4.0 EZ CLN MEGD (MISCELLANEOUS) IMPLANT
ELECTRODE REM PT RTRN 9FT ADLT (ELECTROSURGICAL) ×1 IMPLANT
EVACUATOR SILICONE 100CC (DRAIN) IMPLANT
GLOVE BIO SURGEON STRL SZ7 (GLOVE) ×1 IMPLANT
GLOVE BIO SURGEON STRL SZ7.5 (GLOVE) ×3 IMPLANT
GLOVE BIOGEL PI IND STRL 7.0 (GLOVE) IMPLANT
GLOVE BIOGEL PI IND STRL 8 (GLOVE) ×1 IMPLANT
GLOVE BIOGEL PI INDICATOR 7.0 (GLOVE) ×1
GLOVE BIOGEL PI INDICATOR 8 (GLOVE) ×2
GLOVE EUDERMIC 7 POWDERFREE (GLOVE) ×1 IMPLANT
GOWN STRL REUS W/ TWL LRG LVL3 (GOWN DISPOSABLE) ×1 IMPLANT
GOWN STRL REUS W/ TWL XL LVL3 (GOWN DISPOSABLE) ×1 IMPLANT
GOWN STRL REUS W/TWL LRG LVL3 (GOWN DISPOSABLE) ×10
GOWN STRL REUS W/TWL XL LVL3 (GOWN DISPOSABLE) ×4
HANDLE SUCTION POOLE (INSTRUMENTS) ×1 IMPLANT
HOOK STER FISH 4/PK (MISCELLANEOUS) ×1 IMPLANT
KIT BASIN OR (CUSTOM PROCEDURE TRAY) ×2 IMPLANT
KIT TURNOVER KIT B (KITS) ×2 IMPLANT
LIGASURE IMPACT 36 18CM CVD LR (INSTRUMENTS) IMPLANT
MARKER SKIN DUAL TIP RULER LAB (MISCELLANEOUS) ×1 IMPLANT
MESH VENTRALIGHT ST 10X13IN (Mesh General) ×1 IMPLANT
NDL HYPO 25GX1X1/2 BEV (NEEDLE) IMPLANT
NEEDLE HYPO 25GX1X1/2 BEV (NEEDLE) ×2 IMPLANT
NS IRRIG 1000ML POUR BTL (IV SOLUTION) ×4 IMPLANT
PACK GENERAL/GYN (CUSTOM PROCEDURE TRAY) ×2 IMPLANT
PAD ARMBOARD 7.5X6 YLW CONV (MISCELLANEOUS) ×4 IMPLANT
PENCIL SMOKE EVACUATOR (MISCELLANEOUS) ×2 IMPLANT
SLEEVE SUCTION 125 (MISCELLANEOUS) ×1 IMPLANT
SPONGE LAP 18X18 RF (DISPOSABLE) ×1 IMPLANT
STAPLER VISISTAT 35W (STAPLE) ×2 IMPLANT
SUCTION POOLE HANDLE (INSTRUMENTS) ×2
SUT ETHILON 3 0 FSL (SUTURE) IMPLANT
SUT NOVA NAB GS-21 0 18 T12 DT (SUTURE) ×3 IMPLANT
SUT PDS AB 0 CT 36 (SUTURE) IMPLANT
SUT PDS AB 1 TP1 54 (SUTURE) ×6 IMPLANT
SUT SILK 2 0 SH CR/8 (SUTURE) ×2 IMPLANT
SUT SILK 2 0 TIES 10X30 (SUTURE) ×2 IMPLANT
SUT SILK 3 0 SH CR/8 (SUTURE) ×2 IMPLANT
SUT SILK 3 0 TIES 10X30 (SUTURE) ×2 IMPLANT
SUT VIC AB 2-0 SH 18 (SUTURE) ×3 IMPLANT
SUT VICRYL AB 2 0 TIES (SUTURE) ×2 IMPLANT
TOWEL GREEN STERILE (TOWEL DISPOSABLE) ×2 IMPLANT
TOWEL GREEN STERILE FF (TOWEL DISPOSABLE) ×2 IMPLANT
TRAY FOLEY W/BAG SLVR 16FR (SET/KITS/TRAYS/PACK) ×2
TRAY FOLEY W/BAG SLVR 16FR ST (SET/KITS/TRAYS/PACK) IMPLANT
WATER STERILE IRR 1000ML POUR (IV SOLUTION) ×2 IMPLANT
YANKAUER SUCT BULB TIP NO VENT (SUCTIONS) ×1 IMPLANT

## 2019-03-02 NOTE — Op Note (Signed)
03/02/2019  10:21 AM  PATIENT:  Dave Vaughn  73 y.o. male  PRE-OPERATIVE DIAGNOSIS:  INCISIONAL HERNIA  POST-OPERATIVE DIAGNOSIS:  SA,E  PROCEDURE:  Procedure(s): EXPLORATORY LAPAROTOMY (N/A) INCISIONAL HERNIA REPAIR , TRANSVERSUS ABDOMINUS RELEASES WITH MUSCULOCUTANEOUS ADVANCEMENT FLAPS BILATERALLY AND HERNIA REPAIR (N/A) Lysis Of Adhesion (N/A) Insertion Of Mesh (N/A)  SURGEON:  Surgeon(s) and Role:    Ralene Ok, MD - Primary    * [name], MD - Assisting   ANESTHESIA:   local and general  EBL:  100 mL   BLOOD ADMINISTERED:none  DRAINS: none   LOCAL MEDICATIONS USED:  OTHER EXAPRIL  SPECIMEN:  No Specimen  DISPOSITION OF SPECIMEN:  N/A  COUNTS:  YES  TOURNIQUET:  * No tourniquets in log *  DICTATION: .Dragon Dictation After the patient was consented he was taken back to the operating room placed supine position bilateral SCDs in place. After appropriate antibiotics were confirmed timeout was called and all facts verified.  The superficial scar was dissected off the anterior abdominal wall. This was discarded. Bovie cautery was used to maintain hemostasis and dissection was taken down to the anterior fascia midline. This was incised. The fascia was elevated up. The hernia sac was then bluntly penetrated. At this time I proceeded to incise the fascia inferiorly. There were minimal adhesions of the bowel to the hernia sac in the midline. This allowed [place] to extend the incision inferiorly to the extent of the hernia as well as superiorly.  At this time the rectus sheath was retracted medially. The inferior portion of the left tissues were then incised with electrocautery. The muscle was adherent to the posterior rectus sheath, However I was able to dissect off the left posterior rectus sheath and its entirety. At this time a proximal 1 cm medial to the perforating vessels the transversus abdominis muscle was incised as was its fascia. We carried this  dissection both inferiorly and superiorly. At this time the transversus abdominis muscle was then dissected laterally in a blunt fashion. A right angle was used to initially started the dissection with electrocautery. This easily dissected laterally. This allowed advancement of the peritoneum medially.  The left lower quadrant old colostomy site was encountered.  The peritoneum was then transected.  The transversus abdominis muscle was then picked back up on the lateral side of the ostomy site.  This did leave a small hole in the area of the old ostomy site.  At this time I proceeded to dissect the transversus abdominis muscle off the transversalis fascia laterally.  This was done to approximate the anterior axillary line on the left side.  The peritoneal holes that were made were then reapproximated using a 2-0 Vicryl in a figure-of-eight fashion to close the holes.  At this time I proceeded to retract the right rectus sheath medially and the inferior portion of the rectus sheath was incised. There were minimal muscular adhesions in this area. This easily dissected away. Again approximately 1 cm medial to the perforating vessels to the transversus abdominis muscle was incised as was its fascia. Again we proceeded to dissect with a right angle both superiorly and inferiorly to release the transversus abdominis muscle. At this time proceeded to bluntly dissect away the transverse abdominis muscle from the underlying peritoneum laterally.  This allowed the posterior rectus muscle flap to be advanced medially.  There were some holes in the right upper quadrant area.  These were reapproximated using 2-0 Vicryl's in a figure-of-eight fashion.  The  superior and inferior portions of the dissection were connected in this plane. This allowed [place] to bluntly dissect the preperitoneal space superior posterior to the xiphoid and inferiorly. This was carried approximately to 6 cm superior inferiorly. At this time using Allis  clamps and peritoneum was brought to the midline. This allowed the posterior rectus muscle flap to be advanced medially.  At this time 0 PDS suture was used in a running standard fashion reapproximate the peritoneum in the midline.  There was no tension on the midline peritoneal closure.  At this time a piece of 25 x 33 cm ventral light ST mesh was then placed in the preperitoneal space.  0 Novafil sutures were used to tack the mesh. Fascial using Endo Close device 3 laterally. This was also done superior and inferior portion of the hernia 2 respectively. The mesh lay flat.  This decision was used in the midline to help the mesh adhered to the midline fascia.   At this time the anterior rectus fascia was reapproximated midline using 0 PDS in a standard running fashion. Again there was no tension on the fascia in the midline.  The skin was reapproximated using skin staples. The bilateral stab wounds were then dressed with Dermabond.  Patient was awakened from general anesthesia and taken to recovery room stable condition.      PLAN OF CARE: Admit for overnight observation  PATIENT DISPOSITION:  PACU - hemodynamically stable.   Delay start of Pharmacological VTE agent (>24hrs) due to surgical blood loss or risk of bleeding: yes

## 2019-03-02 NOTE — Discharge Instructions (Signed)
[facility] _______[facility] ° °HERNIA REPAIR: POST OP INSTRUCTIONS ° °Always review your discharge instruction sheet given to you by the facility where your surgery was performed. °IF YOU HAVE DISABILITY OR FAMILY LEAVE FORMS, YOU MUST BRING THEM TO THE OFFICE FOR PROCESSING.   °DO NOT GIVE THEM TO YOUR DOCTOR. ° °1. A  prescription for pain medication may be given to you upon discharge.  Take your pain medication as prescribed, if needed.  If narcotic pain medicine is not needed, then you may take acetaminophen (Tylenol) or ibuprofen (Advil) as needed. °2. Take your usually prescribed medications unless otherwise directed. °If you need a refill on your pain medication, please contact your pharmacy.  They will contact our office to request authorization. Prescriptions will not be filled after 5 pm or on week-ends. °3. You should follow a light diet the first 24 hours after arrival home, such as soup and crackers, etc.  Be sure to include lots of fluids daily.  Resume your normal diet the day after surgery. °4.Most patients will experience some swelling and bruising around the umbilicus or in the groin and scrotum.  Ice packs and reclining will help.  Swelling and bruising can take several days to resolve.  °6. It is common to experience some constipation if taking pain medication after surgery.  Increasing fluid intake and taking a stool softener (such as Colace) will usually help or prevent this problem from occurring.  A mild laxative (Milk of Magnesia or Miralax) should be taken according to package directions if there are no bowel movements after 48 hours. °7. Unless discharge instructions indicate otherwise, you may remove your bandages 24-48 hours after surgery, and you may shower at that time.  You may have steri-strips (small skin tapes) in place directly over the incision.  These strips should be left on the skin for 7-10 days.  If your surgeon used skin glue on the incision, you may shower in  24 hours.  The glue will flake off over the next 2-3 weeks.  Any sutures or staples will be removed at the office during your follow-up visit. °8. ACTIVITIES:  You may resume regular (light) daily activities beginning the next day--such as daily self-care, walking, climbing stairs--gradually increasing activities as tolerated.  You may have sexual intercourse when it is comfortable.  Refrain from any heavy lifting or straining until approved by your doctor. ° °a.You may drive when you are no longer taking prescription pain medication, you can comfortably wear a seatbelt, and you can safely maneuver your car and apply brakes. °b.RETURN TO WORK:   °_____________________________________________ ° °9.You should see your doctor in the office for a follow-up appointment approximately 2-3 weeks after your surgery.  Make sure that you call for this appointment within a day or two after you arrive home to insure a convenient appointment time. °10.OTHER INSTRUCTIONS: _________________________ °   _____________________________________ ° °WHEN TO CALL YOUR DOCTOR: °1. Fever over 101.0 °2. Inability to urinate °3. Nausea and/or vomiting °4. Extreme swelling or bruising °5. Continued bleeding from incision. °6. Increased pain, redness, or drainage from the incision ° °The clinic staff is available to answer your questions during regular business hours.  Please don’t hesitate to call and ask to speak to one of the nurses for clinical concerns.  If you have a medical emergency, go to the nearest emergency room or call 911.  A surgeon from [facility] is always on call at the hospital ° ° °[address], [address], [place], [place]  [address] ? ° [address] [address], , [place]   [address] °[phone] ? [phone] ? FAX [phone] °Web site: [email or web address] ° °

## 2019-03-02 NOTE — Progress Notes (Signed)
Orthopedic Tech Progress Note Patient Details:  Dave Vaughn 1945-09-25 [id number]  Ortho Devices Type of Ortho Device: Abdominal binder Ortho Device/Splint Interventions: Application   Post Interventions Patient Tolerated: Well Instructions Provided: Care of device   [place] Pink 03/02/2019, 4:46 PM

## 2019-03-02 NOTE — H&P (Signed)
History of Present Illness  The patient is a 73 year old male who presents with an incisional hernia. Patient is a 73 year old male who is referred male by Dr. [name] for a ventral incisional hernia. Patient has a history of A. fib and sees Dr. [name] and his an Sanda Klein was. Patient underwent exploratory laparotomy, left colon resection and colostomy in August 2019. Subsequent to this patient underwent robotic partial colectomy and colostomy takedown in January 2020. Patient states that he noticed the hernia after his initial operation. Patient states that he has had some discomfort and pain. He states that after his second operation he did notice that the hernia appeared to enlarge. He states that he does not have overt pain however he has some discomfort. The patient is still active, and works. 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  Patient had a CT scan in January 2020. This revealed a large ventral incisional hernia with approximately 11.5 cm width. Patient's rectus muscles were on the right side 8 cm and 6 cm on the left.  ------------------------------------ This is an extremely nice 73 year old man who presents for evaluation of a large ventral incisional hernia. Dr. Burr Medico is his oncologist. Dr. [name] is his cardiologist. Dr. [name] Plan is his gastroenterologist. Dr. Lovena Neighbours is his urologist.  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 He underwent urgent laparotomy, left colon resection, takedown splenic flexure, end colostomy. 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 13 lymph nodes were negative but there was a positive mesenteric peritoneal deposit. Final stage IIIB. I placed a Port-A-Cath on March 07, 2018.  He has completed his chemotherapy.  On June 12, 2018 16 blood performed robotic lysis of adhesions with  colostomy takedown. He recovered from that surgery uneventfully. He thinks he's had a ventral hernia since before that operation. It is now perhaps larger and bothers him and he would like to get it repaired if he can. His health is good. His attitude is excellent. He remains on Eliquis because of atrial fibrillation.  He had a restaging CT scan recently and there was no evidence of cancer. The rectus muscles looked relatively intact but they are widely separated by 14-15 cm. Physical exam is consistent with that CT finding  I told him that this could be repaired but would likely require a long midline incision with excision of the old scar and a transverse abdominis release technique which I described to him. I advised referral to my partner, Dr. Rosendo Gros, who specializes in this type of surgery. He agreed but asked that I assist in the surgery and I told him that I would be happy to.  He is going to see Dr. Rosendo Gros on July 20   Allergies No Known Drug Allergies  [01/26/2018]: Allergies Reconciled   Medication History Levothyroxine Sodium (100MCG Tablet, Oral) Active. Tamsulosin HCl (0.4MG  Capsule, Oral) Active. Amiodarone HCl (200MG  Tablet, Oral) Active. DilTIAZem HCl ER Coated Beads (240MG  Capsule ER 24HR, Oral) Active. Eliquis (5MG  Tablet, Oral) Active. Atorvastatin Calcium (40MG  Tablet, Oral) Active. Medications Reconciled  BP 128/82   Pulse (!) 58   Temp 98.2 F (36.8 C) (Oral)   Resp 18   Ht 6' (1.829 m)   Wt 105 kg   SpO2 96%   BMI 31.38 kg/m      Physical Exam General Mental Status-Alert. General Appearance-Not in acute distress. Build & Nutrition-Well  nourished. Posture-Normal posture. Gait-Normal.  Head and Neck Head-normocephalic, atraumatic with no lesions or palpable masses. Trachea-midline. Thyroid Gland Characteristics - normal size and consistency and no palpable nodules.  Chest and Lung Exam Chest and lung  exam reveals -on auscultation, normal breath sounds, no adventitious sounds and normal vocal resonance.  Cardiovascular Note: Irregular rhythm. Normal rate.   Abdomen Note: Somewhat overweight with BMI 33. Midline incision with wide scar well-healed. [place] incisional hernia with fascial defect at least 13-14 cm. I don't feel a defect at the colostomy site on the left side but that looks like it there may be this beginnings of a hernia there on the CT scan. Nontender. No organomegaly   Neurologic Neurologic evaluation reveals -alert and oriented x 3 with no impairment of recent or remote memory, normal attention span and ability to concentrate, normal sensation and normal coordination.  Musculoskeletal Normal Exam - Bilateral-Upper Extremity Strength Normal and Lower Extremity Strength Normal.    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 Patient has a history of A. fib an Eliquis. Patient sees Dr. [name]. We'll obtain a clearance to be off his antiplatelet medication prior to scheduling surgery.  1. Patient will begin candidate for exporter laparotomy, lysis of adhesions and likely either retrorectus vs TAR hernia repair with mesh. I discussed the procedure in detail with the patient. 2. All risks and benefits were discussed with the patient to generally include, but not limited to: infection, bleeding, damage to surrounding structures, acute and chronic nerve pain, and recurrence. Alternatives were offered and described. All questions were answered and the patient voiced understanding of the procedure and wishes to proceed at this point with hernia repair.

## 2019-03-02 NOTE — Progress Notes (Signed)
Noted bleeding from the incision site. MD made aware

## 2019-03-02 NOTE — Anesthesia Postprocedure Evaluation (Signed)
Anesthesia Post Note  Patient: Dave Vaughn  Procedure(s) Performed: EXPLORATORY LAPAROTOMY (N/A Abdomen) INCISIONAL HERNIA REPAIR , RECTORECTUS VS TAR HERNIA REPAIR (N/A Abdomen) Lysis Of Adhesion (N/A Abdomen) Insertion Of Mesh (N/A Abdomen)     Patient location during evaluation: PACU Anesthesia Type: General Level of consciousness: awake and alert Pain management: pain level controlled Vital Signs Assessment: post-procedure vital signs reviewed and stable Respiratory status: spontaneous breathing, nonlabored ventilation and respiratory function stable Cardiovascular status: blood pressure returned to baseline and stable Postop Assessment: no apparent nausea or vomiting Anesthetic complications: no    Last Vitals:  Vitals:   03/02/19 1042 03/02/19 1057  BP: (!) 135/93 (!) 117/46  Pulse: 96 73  Resp: 14 16  Temp: (!) 36.1 C   SpO2: 98% 92%    Last Pain:  Vitals:   03/02/19 1211  TempSrc:   PainSc: [place]

## 2019-03-02 NOTE — Anesthesia Procedure Notes (Signed)
Procedure Name: Intubation Date/Time: 03/02/2019 7:43 AM Performed by: Amadeo Garnet, CRNA Pre-anesthesia Checklist: Patient identified, Emergency Drugs available, Suction available, Patient being monitored and Timeout performed Patient Re-evaluated:Patient Re-evaluated prior to induction Oxygen Delivery Method: Circle system utilized Preoxygenation: Pre-oxygenation with 100% oxygen Induction Type: IV induction Ventilation: Mask ventilation without difficulty Laryngoscope Size: Glidescope and 4 Grade View: Grade I Tube type: Oral Tube size: 7.5 mm Number of attempts: 1 Airway Equipment and Method: Stylet and Video-laryngoscopy Placement Confirmation: ETT inserted through vocal cords under direct vision,  positive ETCO2 and breath sounds checked- equal and bilateral Secured at: 22 cm Tube secured with: Tape Dental Injury: Teeth and Oropharynx as per pre-operative assessment

## 2019-03-02 NOTE — Transfer of Care (Signed)
Immediate Anesthesia Transfer of Care Note  Patient: JOLEN BARDI  Procedure(s) Performed: EXPLORATORY LAPAROTOMY (N/A Abdomen) INCISIONAL HERNIA REPAIR , RECTORECTUS VS TAR HERNIA REPAIR (N/A Abdomen) Lysis Of Adhesion (N/A Abdomen) Insertion Of Mesh (N/A Abdomen)  Patient Location: PACU  Anesthesia Type:General  Level of Consciousness: awake, alert  and oriented  Airway & Oxygen Therapy: Patient Spontanous Breathing and Patient connected to nasal cannula oxygen  Post-op Assessment: Report given to RN, Post -op Vital signs reviewed and stable and Patient moving all extremities  Post vital signs: Reviewed and stable  Last Vitals:  Vitals Value Taken Time  BP 135/93 03/02/19 1042  Temp    Pulse 96 03/02/19 1042  Resp 14 03/02/19 1042  SpO2 93 % 03/02/19 1042    Last Pain:  Vitals:   03/02/19 0616  TempSrc: Oral  PainSc:       Patients Stated Pain Goal: 5 ([id number])  Complications: No apparent anesthesia complications

## 2019-03-03 LAB — CBC
HCT: 40.6 % (ref 39.0–52.0)
Hemoglobin: 13.1 g/dL (ref 13.0–17.0)
MCH: 30.8 pg (ref 26.0–34.0)
MCHC: 32.3 g/dL (ref 30.0–36.0)
MCV: 95.5 fL (ref 80.0–100.0)
Platelets: 178 10*3/uL (ref 150–400)
RBC: 4.25 MIL/uL (ref 4.22–5.81)
RDW: 12.4 % (ref 11.5–15.5)
WBC: 10.5 10*3/uL (ref 4.0–10.5)
nRBC: 0 % (ref 0.0–0.2)

## 2019-03-03 MED ORDER — TRAMADOL HCL 50 MG PO TABS: 50.0000 mg | ORAL_TABLET | Freq: Four times a day (QID) | ORAL | 0 refills | Status: DC | PRN

## 2019-03-03 NOTE — Progress Notes (Signed)
Dave Vaughn to be D/C'd  per MD order. Discussed with the patient and all questions fully answered.  VSS, Skin clean, dry and intact without evidence of skin break down, no evidence of skin tears noted.  IV catheter discontinued intact. Site without signs and symptoms of complications. Dressing and pressure applied.  An After Visit Summary was printed and given to the patient. Patient received prescription.  D/c education completed with patient/family including follow up instructions, medication list, d/c activities limitations if indicated, with other d/c instructions as indicated by MD - patient able to verbalize understanding, all questions fully answered.   Patient instructed to return to ED, call 911, or call MD for any changes in condition.   Patient to be escorted via [place], and D/C home via private auto. Dave Vaughn to be D/C'd  per MD order. Discussed with the patient and all questions fully answered.  VSS, Skin clean, dry and intact without evidence of skin break down, no evidence of skin tears noted.  IV catheter discontinued intact. Site without signs and symptoms of complications. Dressing and pressure applied.  An After Visit Summary was printed and given to the patient. Patient received prescription.  D/c education completed with patient/family including follow up instructions, medication list, d/c activities limitations if indicated, with other d/c instructions as indicated by MD - patient able to verbalize understanding, all questions fully answered.   Patient instructed to return to ED, call 911, or call MD for any changes in condition.   Patient to be escorted via [place], and D/C home via private auto.

## 2019-03-03 NOTE — Plan of Care (Signed)

## 2019-03-03 NOTE — Plan of Care (Signed)

## 2019-03-03 NOTE — Progress Notes (Signed)
     Assessment & Plan: POD#1 status post open ventral hernia repair with mesh - Dr. Rosendo Gros  Ambulatory, up in chair  Tolerating diet  Pain well controlled - only taking Tylenol  Plan discharge home today - follow up at [place] office with Dr. Rosendo Gros  Will give Rx for Tramadol if needed.  Wife will monitor wound - small serosanguinous drainage inferiorly.  I changed dressing with her prior to discharge today.        Armandina Gemma, MD       [facility]       Office: [phone]   Chief Complaint: Ventral incisional hernia  Subjective: Patient ambulating, up in chair.  Wife at bedside.  No complaints.  Objective: Vital signs in last 24 hours: Temp:  [97.8 F (36.6 C)-98 F (36.7 C)] 97.8 F (36.6 C) (09/26 0510) Pulse Rate:  [59-73] 67 (09/26 0510) Resp:  [17-20] 17 (09/26 0510) BP: (95-107)/(59-69) 106/69 (09/26 0510) SpO2:  [95 %-97 %] 95 % (09/26 0510) Last BM Date: 03/01/19  Intake/Output from previous day: 09/25 0701 - 09/26 0700 In: 1200 [I.V.:1200] Out: 300 [Urine:200; Blood:100] Intake/Output this shift: Total I/O In: 240 [P.O.:240] Out: 150 [Urine:150]  Physical Exam: HEENT - sclerae clear, mucous membranes moist Neck - soft Chest - clear bilaterally Cor - RRR Abdomen - soft without distension; wounds dry and intact; midline dressing with moderate serosanguinous drainage - changed and binder reapplied Ext - no edema, non-tender Neuro - alert & oriented, no focal deficits  Lab Results:  Recent Labs    03/03/19 0551  WBC 10.5  HGB 13.1  HCT 40.6  PLT 178   BMET Recent Labs    03/02/19 0630  NA 139  K 4.0  CL 103  CO2 26  GLUCOSE 102*  BUN 15  CREATININE 1.39*  CALCIUM 9.1   PT/INR Recent Labs    03/02/19 0630  LABPROT 13.6  INR 1.1   Comprehensive Metabolic Panel:    Component Value Date/Time   NA 139 03/02/2019 0630   NA 142 11/13/2018 1204   K 4.0 03/02/2019 0630   K 4.0 11/13/2018 1204   CL 103 03/02/2019  0630   CL 105 11/13/2018 1204   CO2 26 03/02/2019 0630   CO2 28 11/13/2018 1204   BUN 15 03/02/2019 0630   BUN 21 11/13/2018 1204   CREATININE 1.39 (H) 03/02/2019 0630   CREATININE 1.50 (H) 11/13/2018 1204   CREATININE 1.50 (H) 08/14/2018 1215   CREATININE 1.59 (H) 05/15/2018 1114   GLUCOSE 102 (H) 03/02/2019 0630   GLUCOSE 99 11/13/2018 1204   CALCIUM 9.1 03/02/2019 0630   CALCIUM 9.0 11/13/2018 1204   AST 25 11/13/2018 1204   AST 25 08/14/2018 1215   AST 24 05/15/2018 1114   ALT 26 11/13/2018 1204   ALT 32 08/14/2018 1215   ALT 30 05/15/2018 1114   ALKPHOS 80 11/13/2018 1204   ALKPHOS 88 08/14/2018 1215   BILITOT 0.5 11/13/2018 1204   BILITOT 0.5 08/14/2018 1215   BILITOT 0.3 05/15/2018 1114   PROT 6.8 11/13/2018 1204   PROT 7.2 08/14/2018 1215   ALBUMIN 4.0 11/13/2018 1204   ALBUMIN 4.0 08/14/2018 1215    Studies/Results: No results found.    Armandina Gemma 03/03/2019  Patient ID: Dave Vaughn, male   DOB: 1946/05/24, 73 y.o.   MRN: [id number]

## 2019-03-05 ENCOUNTER — Encounter (HOSPITAL_COMMUNITY): Payer: Self-pay | Admitting: General Surgery

## 2019-03-05 NOTE — Discharge Summary (Signed)
Physician Discharge Summary  Patient ID: Dave Vaughn MRN: [id number] DOB/AGE: [birth year] 72 y.o.  Admit date: 03/02/2019 Discharge date: 03/05/2019  Admission Diagnoses: Incisional hernia  Discharge Diagnoses:  Active Problems:   S/P hernia repair   Discharged Condition: good  Hospital Course: Patient is a 73 year old male came in with an incisional hernia.  Patient underwent open incisional hernia repair with mesh.  Please see operative note for full details. Postoperative patient was sent to the floor.  He underwent usual postop, eras protocol.  Patient was ambulating well on his own.  He was advanced to a regular diet which he tolerated well.  Patient had good pain control was afebrile, deemed stable for discharge and discharged home.  Consults: None  Significant Diagnostic Studies: None  Treatments: surgery: As above  Discharge Exam: Blood pressure 106/69, pulse 67, temperature 97.8 F (36.6 C), temperature source Oral, resp. rate 17, height 6' (1.829 m), weight 105 kg, SpO2 95 %. General appearance: alert and cooperative Cardio: regular rate and rhythm, S1, S2 normal, no murmur, click, rub or gallop GI: soft, non-tender; bowel sounds normal; no masses,  no organomegaly and Incision clean dry and intact.  There was some minimal serosanguineous drainage in the midline.  Disposition:   Discharge Instructions    Diet - low sodium heart healthy   Complete by: As directed    Discharge instructions   Complete by: As directed    [facility], [place]  Always review your discharge instruction sheet given to you by the facility where your surgery was performed.  A  prescription for pain medication may be given to you upon discharge.  Take your pain medication as prescribed.  If narcotic pain medicine is not needed, then you may take acetaminophen (Tylenol) or ibuprofen (Advil) as needed.  Take your usually prescribed medications  unless otherwise directed.  If you need a refill on your pain medication, please contact your pharmacy.  They will contact our office to request authorization. Prescriptions will not be filled after 5 pm daily or on weekends.  You should follow a light diet the first 24 hours after arrival home, such as soup and crackers or toast.  Be sure to include plenty of fluids daily.  Resume your normal diet the day after surgery.  Most patients will experience some swelling and bruising around the surgical site.  Ice packs and reclining will help.  Swelling and bruising can take several days to resolve.   It is common to experience some constipation if taking pain medication after surgery.  Increasing fluid intake and taking a stool softener (such as Colace) will usually help or prevent this problem from occurring.  A mild laxative (Milk of Magnesia or Miralax) should be taken according to package directions if there are no bowel movements after 48 hours.  You will likely have Dermabond (topical glue) over your incisions.  This seals the incisions and allows you to bathe and shower at any time after your surgery.  Glue should remain in place for up to 10 days.  It may be removed after 10 days by pealing off the Dermabond material or using Vaseline or naval jelly to remove.  If you have steri-strips over your incisions, you may remove the gauze bandage on the second day after surgery, and you may shower at that time.  Leave your steri-strips (small skin tapes) in place directly over the incision.  These strips should remain on the skin for  5-7 days and then be removed.  You may get them wet in the shower and pat them dry.  ACTIVITIES:  You may resume regular (light) daily activities beginning the next day - such as daily self-care, walking, climbing stairs - gradually increasing activities as tolerated.  You may have sexual intercourse when it is comfortable.  Refrain from any heavy lifting or straining until  approved by your doctor.  You may drive when you are no longer taking prescription pain medication, when you can comfortably wear a seatbelt, and when you can safely maneuver your car and apply brakes.  You should see your doctor in the office for a follow-up appointment approximately 2-3 weeks after your surgery.  Make sure that you call for this appointment within a day or two after you arrive home to insure a convenient appointment time.  WHEN TO CALL YOUR DOCTOR: Fever greater than 101.0 Inability to urinate Persistent nausea and/or vomiting Extreme swelling or bruising Continued bleeding from incision Increased pain, redness, or drainage from the incision  The clinic staff is available to answer your questions during regular business hours.  Please don't hesitate to call and ask to speak to one of the nurses for clinical concerns.  If you have a medical emergency, go to the nearest emergency room or call 911.  A surgeon from [facility] is always on call for the hospital.   [facility] [address] [place], [place], [place]  [address]  [phone] ? [phone] ? FAX [phone]  [email or web address]   Discharge wound care:   Complete by: As directed    Change dressing as instructed daily and as needed.  Wear abdominal binder.  Would not shower for another 48 hours or until drainage stops completely.   Increase activity slowly   Complete by: As directed      Allergies as of 03/03/2019   No Known Allergies     Medication List    TAKE these medications   amiodarone 200 MG tablet Commonly known as: Pacerone Take 1 tablet (200 mg total) by mouth 2 (two) times daily. What changed: when to take this Notes to patient: tonight   apixaban 5 MG Tabs tablet Commonly known as: ELIQUIS Take 1 tablet (5 mg total) by mouth 2 (two) times daily. Notes to patient: Per home dose   atorvastatin 40 MG tablet Commonly known as:  LIPITOR Take 40 mg by mouth daily.   b complex vitamins tablet Take 1 tablet by mouth daily. Notes to patient: Per home dose   diltiazem 240 MG 24 hr capsule Commonly known as: CARDIZEM CD Take 240 mg by mouth daily. Notes to patient: tomorrow   docusate sodium 100 MG capsule Commonly known as: COLACE Take 100 mg by mouth 2 (two) times daily. Notes to patient: tonight   isosorbide mononitrate 30 MG 24 hr tablet Commonly known as: IMDUR Take 30 mg by mouth daily. Notes to patient: tomorrow   levothyroxine 100 MCG tablet Commonly known as: SYNTHROID Take 100 mcg by mouth daily before breakfast. Notes to patient: tomorrow   lidocaine-prilocaine cream Commonly known as: EMLA Apply to affected area once What changed:   how much to take  how to take this  when to take this  reasons to take this  additional instructions   MULTIVITAMIN/IRON PO Take 1 tablet by mouth daily. Notes to patient: Per home dose   tamsulosin 0.4 MG Caps capsule Commonly known as: FLOMAX Take 0.4 mg  by mouth 2 (two) times daily. Notes to patient: tonight   traMADol 50 MG tablet Commonly known as: ULTRAM Take 1-2 tablets (50-100 mg total) by mouth every 6 (six) hours as needed for moderate pain.            Discharge Care Instructions  (From admission, onward)         Start     Ordered   03/03/19 0000  Discharge wound care:    Comments: Change dressing as instructed daily and as needed.  Wear abdominal binder.  Would not shower for another 48 hours or until drainage stops completely.   03/03/19 1148         Follow-up Information    Ralene Ok, MD. Schedule an appointment as soon as possible for a visit in 2 weeks.   Specialty: General Surgery Why: Post op visit Contact information: [address] [place] [id number] [phone]           Signed: Ralene Ok 03/05/2019, 12:59 PM

## 2019-03-14 NOTE — Progress Notes (Signed)
[place]   [phone] Fax:(336) [phone]   Clinic Follow up Note   Patient Care Team: [name], MD as PCP - General (Family Medicine) [name], MD as Consulting Physician (Cardiology) [name], MD as Consulting Physician (Gastroenterology) [name], MD as Consulting Physician (General Surgery) [name], MD as Consulting Physician (General Surgery) [name], MD as Consulting Physician (Urology) [name], MD as Consulting Physician (Medical Oncology)  Date of Service:  03/16/2019  CHIEF COMPLAINT: F/u on colon cancer  SUMMARY OF ONCOLOGIC HISTORY: Oncology History Overview Note  Cancer Staging Cancer of left colon [facility]) Staging form: Colon and Rectum, AJCC 8th Edition - Pathologic stage from 01/11/2018: Stage IIIB (pT3, pN1c, cM0) - Signed by [name], MD on 01/16/2018     Cancer of left colon ([place])  01/10/2018 Imaging   CT AP W Contrast 01/10/18  IMPRESSION: Irregular soft tissue density causing stricture of the mid descending colon likely the site of obstruction for the dilated small bowel. 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 No evidence of perforation.  Equivocal findings involving the appendix measuring 1.2 cm at the appendiceal tip with mucosal enhancement. No adjacent free fluid or inflammatory change. Findings are nonspecific, but can be seen with early acute appendicitis.  Mild prostatic enlargement. Increased density over the posterior bladder base likely due to the large prostatic impression although cannot completely exclude a bladder mass. Urology protocol CT or ultrasound may be helpful for better evaluation.  Mild cholelithiasis.  Stable 1.5 cm cystic structure over the lower pole right kidney likely slightly hyperdense cyst.  Diverticulosis of the colon.  Aortic Atherosclerosis (ICD10-I70.0).     01/11/2018 Cancer Staging   Staging form: Colon and Rectum, AJCC  8th Edition - Pathologic stage from 01/11/2018: Stage IIIB (pT3, pN1c, cM0) - Signed by [name], MD on 01/16/2018   01/11/2018 Surgery   LEFT COLON RESECTION, TAKEDOWN SPLENIC FLEXURE, COLOSTOMY by Dr. [name]    01/11/2018 Procedure   Colonoscopy 01/11/18 by Dr. [name]  - Malignant completely obstructing tumor in the mid descending colon. Tattooed. - Diverticulosis in the sigmoid colon. - Non-bleeding internal hemorrhoids. - No specimens collected.   01/11/2018 Pathology Results   Diagnosis 01/11/18  1. Colon, segmental resection for tumor, descending colon - INVASIVE COLORECTAL ADENOCARCINOMA, 4 CM. - TUMOR EXTENDS INTO PERICOLONIC CONNECTIVE TISSUE. - TUMOR FOCALLY INVOLVES RADIAL MARGIN. - ONE MESENTERIC TUMOR DEPOSIT. - THIRTEEN BENIGN LYMPH NODES (0/13). 2. Colon, segmental resection, splenic flexure - BENIGN COLON. - NO EVIDENCE OF MALIGNANCY .   01/11/2018 Tumor Marker   Baseline CEA at 3.4   01/16/2018 Initial Diagnosis   Cancer of left colon ([place])   01/23/2018 Imaging   CT CHEST WO CONTRAST IMPRESSION: 1. No evidence for metastatic disease within the chest. 2. Small left pleural effusion with underlying opacities which may represent atelectasis. Right basilar atelectasis. 3. Few foci of gas within the upper abdomen in the omentum with surrounding fat stranding, likely postsurgical 4. Aortic Atherosclerosis (ICD10-I70.0).   03/08/2018 - 05/15/2018 Chemotherapy   adjuvant FOLOFX. Due to side effects of neuropahty Oxaliplatin was stopped after 3 cycles and chemo was stopped after 6 cycles. He declined completing 6 months of chemo treatment.     03/19/2018 Imaging   03/19/2018 CT AP IMPRESSION: 1. Interval partial left hemicolectomy and descending colostomy. 2. Heterogeneous soft tissue density along the left anterior renal fascia is most likely postoperative (favor fat necrosis). No well-defined fluid collection. 3. Mild left lower quadrant  edema, new since 01/10/2018. This  could be postoperative. Superimposed sigmoid diverticulitis and/or cystitis cannot be excluded. 4. Subtle hyperenhancing nodule within the anterior bladder dome cannot be excluded. Consider nonemergent cystoscopy. When this is performed, recommend attention to the left ureterovesicular junction and distal left ureter to evaluate questionable soft tissue fullness. 5. Cholelithiasis. 6.  Aortic Atherosclerosis (ICD10-I70.0). 7. Prostatomegaly.   11/13/2018 Imaging   CT CAP WO Contrast 11/13/18  IMPRESSION: 1. Reversal of left lower quadrant colostomy with sigmoid colon anastomosis. No complicating features. No findings for residual or recurrent tumor or metastatic disease involving the chest, abdomen or pelvis without contrast. 2. No acute abdominal/pelvic findings. 3. Gallbladder sludge and gallstones but no findings for acute cholecystitis. 4. Stable anterior abdominal wall hernia. 5. The right testicle is in the right inguinal canal.      CURRENT THERAPY:  Surveillance  INTERVAL HISTORY:  Dave Vaughn is here for a follow up of colon cancer. He presents to the clinic alone. He notes he is doing well. He feels he has is overall back to baseline from prior surgery and treatment. He has no more neuropathy but his balance is still recovering. 2 weeks ago he underwent hernia repair surgery and still has stitches in. He feels he is recovering well from this.     REVIEW OF SYSTEMS:   Constitutional: Denies fevers, chills or abnormal weight loss Eyes: Denies blurriness of vision Ears, nose, mouth, throat, and face: Denies mucositis or sore throat Respiratory: Denies cough, dyspnea or wheezes Cardiovascular: Denies palpitation, chest discomfort or lower extremity swelling Gastrointestinal:  Denies nausea, heartburn or change in bowel habits Skin: Denies abnormal skin rashes Lymphatics: Denies new lymphadenopathy or easy bruising Neurological:Denies numbness, tingling or new  weaknesses (+) Mild unsteady balance  Behavioral/Psych: Mood is stable, no new changes  All other systems were reviewed with the patient and are negative.  MEDICAL HISTORY:  Past Medical History:  Diagnosis Date  . Adenocarcinoma, colon ([place]) dx'd 01/2018  . Anemia    taking iron supplements  . Anxiety   . Atrial fibrillation with RVR ([place])   . Colonic obstruction ([place]) 01/10/2018  . Depression   . Diverticulitis   . Dysrhythmia    afib  . History of kidney stones     SURGICAL HISTORY: Past Surgical History:  Procedure Laterality Date  . ANKLE SURGERY Left    when he was in college  . COLON RESECTION N/A 01/11/2018   Procedure: LEFT COLON RESECTION, TAKEDOWN SPLENIC FLEXURE, COLOSTOMY;  Surgeon: [name], MD;  Location: [facility];  Service: General;  Laterality: N/A;  . COLONOSCOPY  01/11/2018   Procedure: COLONOSCOPY;  Surgeon: Jackquline Denmark, MD;  Location: [facility];  Service: Endoscopy;;  . colonscopy  05/10/2018  . HERNIA REPAIR    . HERNIA REPAIR  03/02/2019   EXPLORATORY LAPAROTOMY (N/A Abdomen)  . INCISIONAL HERNIA REPAIR N/A 03/02/2019   Procedure: INCISIONAL HERNIA REPAIR , RECTORECTUS VS TAR HERNIA REPAIR;  Surgeon: Ralene Ok, MD;  Location: [facility];  Service: General;  Laterality: N/A;  . INSERTION OF MESH N/A 03/02/2019   Procedure: Insertion Of Mesh;  Surgeon: Ralene Ok, MD;  Location: [facility];  Service: General;  Laterality: N/A;  . LAPAROTOMY N/A 03/02/2019   Procedure: EXPLORATORY LAPAROTOMY;  Surgeon: Ralene Ok, MD;  Location: [facility];  Service: General;  Laterality: N/A;  . LYSIS OF ADHESION N/A 06/12/2018   Procedure: LYSIS OF ADHESIONS;  Surgeon: [name], MD;  Location: [facility];  Service:  General;  Laterality: N/A;  . LYSIS OF ADHESION N/A 03/02/2019   Procedure: Lysis Of Adhesion;  Surgeon: Ralene Ok, MD;  Location: [facility];  Service: General;  Laterality: N/A;  . PORTACATH PLACEMENT Right 03/07/2018   Procedure: INSERTION PORT-A-CATH RIGHT  SUBCLAVIAN;  Surgeon: [name], MD;  Location: [facility];  Service: General;  Laterality: Right;  . PROCTOSCOPY N/A 06/12/2018   Procedure: RIGID PROCTOSCOPY;  Surgeon: [name], MD;  Location: [facility];  Service: General;  Laterality: N/A;  . thumb surgery   2018   cyst removal    I have reviewed the social history and family history with the patient and they are unchanged from previous note.  ALLERGIES:  has No Known Allergies.  MEDICATIONS:  Current Outpatient Medications  Medication Sig Dispense Refill  . amiodarone (PACERONE) 200 MG tablet Take 1 tablet (200 mg total) by mouth 2 (two) times daily. (Patient taking differently: Take 200 mg by mouth daily. ) 60 tablet 0  . apixaban (ELIQUIS) 5 MG TABS tablet Take 1 tablet (5 mg total) by mouth 2 (two) times daily. 60 tablet 0  . aspirin EC 81 MG tablet Take 81 mg by mouth daily.    [name] atorvastatin (LIPITOR) 40 MG tablet Take 40 mg by mouth daily.    [name] b complex vitamins tablet Take 1 tablet by mouth daily.    [name] diltiazem (CARDIZEM CD) 240 MG 24 hr capsule Take 240 mg by mouth daily.    [name] docusate sodium (COLACE) 100 MG capsule Take 100 mg by mouth 2 (two) times daily.    . isosorbide mononitrate (IMDUR) 30 MG 24 hr tablet Take 30 mg by mouth daily.    [name] levothyroxine (SYNTHROID) 100 MCG tablet Take 100 mcg by mouth daily before breakfast.    . lidocaine-prilocaine (EMLA) cream Apply to affected area once (Patient taking differently: Apply 1 application topically daily as needed (prior to port being accessed.). ) 30 g 3  . Multiple Vitamins-Iron (MULTIVITAMIN/IRON PO) Take 1 tablet by mouth daily.     . tamsulosin (FLOMAX) 0.4 MG CAPS capsule Take 0.4 mg by mouth 2 (two) times daily.     . traMADol (ULTRAM) 50 MG tablet Take 1-2 tablets (50-100 mg total) by mouth every 6 (six) hours as needed for moderate pain. 20 tablet 0   No current facility-administered medications for this visit.     PHYSICAL EXAMINATION: ECOG PERFORMANCE  STATUS: 0 - Asymptomatic  Vitals:   03/16/19 1312  BP: (!) 142/85  Pulse: 74  Resp: 17  Temp: 98.9 F (37.2 C)  SpO2: 98%   Filed Weights   03/16/19 1312  Weight: 229 lb 14.4 oz (104.3 kg)    GENERAL:alert, no distress and comfortable SKIN: skin color, texture, turgor are normal, no rashes or significant lesions EYES: normal, Conjunctiva are pink and non-injected, sclera clear  NECK: supple, thyroid normal size, non-tender, without nodularity LYMPH:  no palpable lymphadenopathy in the cervical, axillary  LUNGS: clear to auscultation and percussion with normal breathing effort HEART: regular rate & rhythm and no murmurs and no lower extremity edema ABDOMEN:abdomen soft, non-tender and normal bowel sounds (+) Hernia band covering surgical incision Musculoskeletal:no cyanosis of digits and no clubbing  NEURO: alert & oriented x 3 with fluent speech, no focal motor/sensory deficits  LABORATORY DATA:  I have reviewed the data as listed CBC Latest Ref Rng & Units 03/16/2019 03/03/2019 02/23/2019  WBC 4.0 - 10.5 K/uL 7.1 10.5 5.6  Hemoglobin 13.0 -  17.0 g/dL 12.9(L) 13.1 14.7  Hematocrit 39.0 - 52.0 % 39.4 40.6 43.7  Platelets 150 - 400 K/uL 263 178 174     CMP Latest Ref Rng & Units 03/16/2019 03/02/2019 11/13/2018  Glucose 70 - 99 mg/dL 92 102(H) 99  BUN 8 - 23 mg/dL 21 15 21   Creatinine 0.61 - 1.24 mg/dL 1.27(H) 1.39(H) 1.50(H)  Sodium 135 - 145 mmol/L 141 139 142  Potassium 3.5 - 5.1 mmol/L 4.5 4.0 4.0  Chloride 98 - 111 mmol/L 103 103 105  CO2 22 - 32 mmol/L 27 26 28   Calcium 8.9 - 10.3 mg/dL 9.2 9.1 9.0  Total Protein 6.5 - 8.1 g/dL 7.1 - 6.8  Total Bilirubin 0.3 - 1.2 mg/dL 0.5 - 0.5  Alkaline Phos 38 - 126 U/L 107 - 80  AST 15 - 41 U/L 23 - 25  ALT 0 - 44 U/L 23 - 26      RADIOGRAPHIC STUDIES: I have personally reviewed the radiological images as listed and agreed with the findings in the report. No results found.   ASSESSMENT & PLAN:  Dave Vaughn is a 73  y.o. male with   1. Cancer of left colon,adenocarcinoma, stage IIIB(pT3N1cM0), MSI-stable -Diagnosed in 01/2018. Treated with surgery andadjuvantFOLFOX. Due to side effects Oxaliplatin was stopped after 3 cycles and chemo was stopped after 6 cycles. He declined completing 6 months of chemo treatment. -I advised him to keep his port for a year due torisk ofcancer recurrence. He agrees. We will keep it and flush every 6-8 weeks due to COVID-19.  -His 11/13/18 CT CAP showed surgical change, no findings of residual or metastatic disease. There was incidental findings of gallbladder stones and sluggish gall bladder. CT was done without iv contrast due to his CKD.  -He is clinically doing well. He is overall back to baseline from prior chemo and surgery. Neuropathy resolved and balance recovering. Labs reviewed, CBC and CMP WNL except Hg 12.9, Cr 1.27. CEA and iron panel still pending today. Physical exam benign.  -He had hernia repair surgery on 03/02/19. He is recovering well.  -He still has Port in place, will flush every 2 months. He is fine to have PAC removed after 2 year away from diagnosis.  -Continue surveillance. f/u in 4 months. Next scan in 11/2019.   2. Atrial Fibrillation -continue Eliquis. Rate controlled. -He will continue to follow up with his cardiologist.   3.CKD stage III -he has developed mild increased Cr since he started chemo, EGFR around 40-50's -I encouraged him to control his blood pressure, cholesterol and BG. I also encouraged him to drink plenty of water. 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 New resolved now   Plan -Flush in 2 months  -Lab, flush and f/u in 4 months   No problem-specific Assessment & Plan notes found for this  encounter.   Orders Placed This Encounter  Procedures  . CBC with Differential (Cancer Center Only)    Standing Status:   Standing    Number of Occurrences:   30    Standing Expiration Date:   03/15/2024  . CMP ([facility] only)    Standing Status:   Standing    Number of Occurrences:   30    Standing Expiration Date:   03/15/2024  .  CEA (IN HOUSE-[facility])    Standing Status:   Standing    Number of Occurrences:   30    Standing Expiration Date:   03/15/2024   All questions were answered. The patient knows to call the clinic with any problems, questions or concerns. No barriers to learning was detected. I spent 15 minutes counseling the patient face to face. The total time spent in the appointment was 20 minutes and more than 50% was on counseling and review of test results     [name], MD 03/16/2019   I, Joslyn Devon, am acting as scribe for [name], MD.   I have reviewed the above documentation for accuracy and completeness, and I agree with the above.

## 2019-03-16 ENCOUNTER — Other Ambulatory Visit: Payer: Self-pay

## 2019-03-16 ENCOUNTER — Inpatient Hospital Stay (HOSPITAL_BASED_OUTPATIENT_CLINIC_OR_DEPARTMENT_OTHER): Payer: Commercial Managed Care - PPO | Admitting: Hematology

## 2019-03-16 ENCOUNTER — Inpatient Hospital Stay: Payer: Commercial Managed Care - PPO

## 2019-03-16 ENCOUNTER — Encounter: Payer: Self-pay | Admitting: Hematology

## 2019-03-16 ENCOUNTER — Inpatient Hospital Stay: Payer: Commercial Managed Care - PPO | Attending: Hematology

## 2019-03-16 VITALS — BP 142/85 | HR 74 | Temp 98.9°F | Resp 17 | Ht 72.0 in | Wt 229.9 lb

## 2019-03-16 DIAGNOSIS — Z452 Encounter for adjustment and management of vascular access device: Secondary | ICD-10-CM | POA: Diagnosis not present

## 2019-03-16 DIAGNOSIS — Z7901 Long term (current) use of anticoagulants: Secondary | ICD-10-CM | POA: Insufficient documentation

## 2019-03-16 DIAGNOSIS — I4891 Unspecified atrial fibrillation: Secondary | ICD-10-CM | POA: Insufficient documentation

## 2019-03-16 DIAGNOSIS — N183 Chronic kidney disease, stage 3 unspecified: Secondary | ICD-10-CM | POA: Insufficient documentation

## 2019-03-16 DIAGNOSIS — C186 Malignant neoplasm of descending colon: Secondary | ICD-10-CM

## 2019-03-16 DIAGNOSIS — Z95828 Presence of other vascular implants and grafts: Secondary | ICD-10-CM

## 2019-03-16 DIAGNOSIS — G620 Drug-induced polyneuropathy: Secondary | ICD-10-CM | POA: Diagnosis not present

## 2019-03-16 DIAGNOSIS — Z85038 Personal history of other malignant neoplasm of large intestine: Secondary | ICD-10-CM | POA: Insufficient documentation

## 2019-03-16 LAB — CBC WITH DIFFERENTIAL (CANCER CENTER ONLY)
Abs Immature Granulocytes: 0.02 10*3/uL (ref 0.00–0.07)
Basophils Absolute: 0.1 10*3/uL (ref 0.0–0.1)
Basophils Relative: 1 %
Eosinophils Absolute: 0.2 10*3/uL (ref 0.0–0.5)
Eosinophils Relative: 2 %
HCT: 39.4 % (ref 39.0–52.0)
Hemoglobin: 12.9 g/dL — ABNORMAL LOW (ref 13.0–17.0)
Immature Granulocytes: 0 %
Lymphocytes Relative: 27 %
Lymphs Abs: 1.9 10*3/uL (ref 0.7–4.0)
MCH: 30.8 pg (ref 26.0–34.0)
MCHC: 32.7 g/dL (ref 30.0–36.0)
MCV: 94 fL (ref 80.0–100.0)
Monocytes Absolute: 0.5 10*3/uL (ref 0.1–1.0)
Monocytes Relative: 7 %
Neutro Abs: 4.5 10*3/uL (ref 1.7–7.7)
Neutrophils Relative %: 63 %
Platelet Count: 263 10*3/uL (ref 150–400)
RBC: 4.19 MIL/uL — ABNORMAL LOW (ref 4.22–5.81)
RDW: 12.3 % (ref 11.5–15.5)
WBC Count: 7.1 10*3/uL (ref 4.0–10.5)
nRBC: 0 % (ref 0.0–0.2)

## 2019-03-16 LAB — CMP (CANCER CENTER ONLY)
ALT: 23 U/L (ref 0–44)
AST: 23 U/L (ref 15–41)
Albumin: 3.9 g/dL (ref 3.5–5.0)
Alkaline Phosphatase: 107 U/L (ref 38–126)
Anion gap: 11 (ref 5–15)
BUN: 21 mg/dL (ref 8–23)
CO2: 27 mmol/L (ref 22–32)
Calcium: 9.2 mg/dL (ref 8.9–10.3)
Chloride: 103 mmol/L (ref 98–111)
Creatinine: 1.27 mg/dL — ABNORMAL HIGH (ref 0.61–1.24)
GFR, Est AFR Am: >60 mL/min (ref >60)
GFR, Estimated: 56 mL/min — ABNORMAL LOW (ref >60)
Glucose, Bld: 92 mg/dL (ref 70–99)
Potassium: 4.5 mmol/L (ref 3.5–5.1)
Sodium: 141 mmol/L (ref 135–145)
Total Bilirubin: 0.5 mg/dL (ref 0.3–1.2)
Total Protein: 7.1 g/dL (ref 6.5–8.1)

## 2019-03-16 LAB — FERRITIN: Ferritin: 219 ng/mL (ref 24–336)

## 2019-03-16 LAB — CEA (IN HOUSE-CHCC): CEA (CHCC-In House): 2.7 ng/mL (ref 0.00–5.00)

## 2019-03-16 LAB — IRON AND TIBC
Iron: 69 ug/dL (ref 42–163)
Saturation Ratios: 27 % (ref 20–55)
TIBC: 258 ug/dL (ref 202–409)
UIBC: 189 ug/dL (ref 117–376)

## 2019-03-16 MED ORDER — HEPARIN SOD (PORK) LOCK FLUSH 100 UNIT/ML IV SOLN
500.0000 [IU] | Freq: Once | INTRAVENOUS | Status: AC
  Administered 2019-03-16: 500 [IU]
  Filled 2019-03-16: qty 5

## 2019-03-16 MED ORDER — SODIUM CHLORIDE 0.9% FLUSH
10.0000 mL | Freq: Once | INTRAVENOUS | Status: AC
  Administered 2019-03-16: 13:00:00 10 mL
  Filled 2019-03-16: qty 10

## 2019-03-19 ENCOUNTER — Telehealth: Payer: Self-pay

## 2019-03-19 ENCOUNTER — Telehealth: Payer: Self-pay | Admitting: Hematology

## 2019-03-19 NOTE — Telephone Encounter (Signed)
TC to pt per Lacie to let him know tumor marker and iron panel are normal, no concerns. Patient verbalized understanding. No further problems or concerns at this time

## 2019-03-19 NOTE — Telephone Encounter (Signed)
Scheduled appt per 10/9 los.  Left a voice message of the appt date and time.

## 2019-05-16 ENCOUNTER — Inpatient Hospital Stay: Payer: Commercial Managed Care - PPO | Attending: Hematology

## 2019-05-16 ENCOUNTER — Other Ambulatory Visit: Payer: Self-pay

## 2019-05-16 DIAGNOSIS — Z85038 Personal history of other malignant neoplasm of large intestine: Secondary | ICD-10-CM | POA: Insufficient documentation

## 2019-05-16 DIAGNOSIS — Z95828 Presence of other vascular implants and grafts: Secondary | ICD-10-CM

## 2019-05-16 MED ORDER — HEPARIN SOD (PORK) LOCK FLUSH 100 UNIT/ML IV SOLN
500.0000 [IU] | Freq: Once | INTRAVENOUS | Status: AC | PRN
  Administered 2019-05-16: 500 [IU]
  Filled 2019-05-16: qty 5

## 2019-05-16 MED ORDER — SODIUM CHLORIDE 0.9% FLUSH
10.0000 mL | INTRAVENOUS | Status: DC | PRN
  Administered 2019-05-16: 10 mL
  Filled 2019-05-16: qty 10

## 2019-05-16 NOTE — Patient Instructions (Signed)

## 2019-07-11 NOTE — Progress Notes (Signed)
[place]   [phone] [phone]   Clinic Follow up Note   Patient Care Team: [name], MD as PCP - General (Family Medicine) [name], MD as Consulting Physician (Cardiology) [name], MD as Consulting Physician (Gastroenterology) [name], MD as Consulting Physician (General Surgery) [name], MD as Consulting Physician (General Surgery) [name], MD as Consulting Physician (Urology) [name], MD as Consulting Physician (Medical Oncology)  Date of Service:  07/16/2019  CHIEF COMPLAINT: F/u on colon cancer  SUMMARY OF ONCOLOGIC HISTORY: Oncology History Overview Note  Cancer Staging Cancer of left colon [facility]) Staging form: Colon and Rectum, AJCC 8th Edition - Pathologic stage from 01/11/2018: Stage IIIB (pT3, pN1c, cM0) - Signed by [name], MD on 01/16/2018     Cancer of left colon ([place])  01/10/2018 Imaging   CT AP W Contrast 01/10/18  IMPRESSION: Irregular soft tissue density causing stricture of the mid descending colon likely the site of obstruction for the dilated small bowel. 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 No evidence of perforation.  Equivocal findings involving the appendix measuring 1.2 cm at the appendiceal tip with mucosal enhancement. No adjacent free fluid or inflammatory change. Findings are nonspecific, but can be seen with early acute appendicitis.  Mild prostatic enlargement. Increased density over the posterior bladder base likely due to the large prostatic impression although cannot completely exclude a bladder mass. Urology protocol CT or ultrasound may be helpful for better evaluation.  Mild cholelithiasis.  Stable 1.5 cm cystic structure over the lower pole right kidney likely slightly hyperdense cyst.  Diverticulosis of the colon.  Aortic Atherosclerosis (ICD10-I70.0).     01/11/2018 Cancer Staging   Staging form: Colon and Rectum, AJCC 8th  Edition - Pathologic stage from 01/11/2018: Stage IIIB (pT3, pN1c, cM0) - Signed by [name], MD on 01/16/2018   01/11/2018 Surgery   LEFT COLON RESECTION, TAKEDOWN SPLENIC FLEXURE, COLOSTOMY by Dr. [name]    01/11/2018 Procedure   Colonoscopy 01/11/18 by Dr. [name]  - Malignant completely obstructing tumor in the mid descending colon. Tattooed. - Diverticulosis in the sigmoid colon. - Non-bleeding internal hemorrhoids. - No specimens collected.   01/11/2018 Pathology Results   Diagnosis 01/11/18  1. Colon, segmental resection for tumor, descending colon - INVASIVE COLORECTAL ADENOCARCINOMA, 4 CM. - TUMOR EXTENDS INTO PERICOLONIC CONNECTIVE TISSUE. - TUMOR FOCALLY INVOLVES RADIAL MARGIN. - ONE MESENTERIC TUMOR DEPOSIT. - THIRTEEN BENIGN LYMPH NODES (0/13). 2. Colon, segmental resection, splenic flexure - BENIGN COLON. - NO EVIDENCE OF MALIGNANCY .   01/11/2018 Tumor Marker   Baseline CEA at 3.4   01/16/2018 Initial Diagnosis   Cancer of left colon ([place])   01/23/2018 Imaging   CT CHEST WO CONTRAST IMPRESSION: 1. No evidence for metastatic disease within the chest. 2. Small left pleural effusion with underlying opacities which may represent atelectasis. Right basilar atelectasis. 3. Few foci of gas within the upper abdomen in the omentum with surrounding fat stranding, likely postsurgical 4. Aortic Atherosclerosis (ICD10-I70.0).   03/08/2018 - 05/15/2018 Chemotherapy   adjuvant FOLOFX. Due to side effects of neuropahty Oxaliplatin was stopped after 3 cycles and chemo was stopped after 6 cycles. He declined completing 6 months of chemo treatment.     03/19/2018 Imaging   03/19/2018 CT AP IMPRESSION: 1. Interval partial left hemicolectomy and descending colostomy. 2. Heterogeneous soft tissue density along the left anterior renal fascia is most likely postoperative (favor fat necrosis). No well-defined fluid collection. 3. Mild left lower quadrant  edema, new since 01/10/2018. This  could be postoperative. Superimposed sigmoid diverticulitis and/or cystitis cannot be excluded. 4. Subtle hyperenhancing nodule within the anterior bladder dome cannot be excluded. Consider nonemergent cystoscopy. When this is performed, recommend attention to the left ureterovesicular junction and distal left ureter to evaluate questionable soft tissue fullness. 5. Cholelithiasis. 6.  Aortic Atherosclerosis (ICD10-I70.0). 7. Prostatomegaly.   11/13/2018 Imaging   CT CAP WO Contrast 11/13/18  IMPRESSION: 1. Reversal of left lower quadrant colostomy with sigmoid colon anastomosis. No complicating features. No findings for residual or recurrent tumor or metastatic disease involving the chest, abdomen or pelvis without contrast. 2. No acute abdominal/pelvic findings. 3. Gallbladder sludge and gallstones but no findings for acute cholecystitis. 4. Stable anterior abdominal wall hernia. 5. The right testicle is in the right inguinal canal.      CURRENT THERAPY:  Surveillance  INTERVAL HISTORY:  [name] is here for a follow up of colon cancer. He presents to the clinic alone. He notes he is doing well. He had hernia repair in 02/2019. He has recovered well. He notes he plans to have COVID19 test tomorrow and plans to get Vaccine when he gets back. He notes he is still at work office and continues [place] precautions. He notes he was seen by his cardiologist last week. He is still on Amiodarone and wonders should he request to stop this medication. I encouraged him to ask his cardiologist. I reviewed his medication list with him. He is no longer on tramadol. 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 He notes he feels his balance on left foot has decreased overall.   He notes he plans to travel to [place] for his son's birthday.    REVIEW OF SYSTEMS:   Constitutional: Denies fevers, chills or abnormal weight loss Eyes: Denies blurriness of vision Ears, nose, mouth, throat, and face:  Denies mucositis or sore throat Respiratory: Denies cough, dyspnea or wheezes Cardiovascular: Denies palpitation, chest discomfort or lower extremity swelling Gastrointestinal:  Denies nausea, heartburn or change in bowel habits Skin: Denies abnormal skin rashes Lymphatics: Denies new lymphadenopathy or easy bruising Neurological:Denies numbness, tingling or new weaknesses Behavioral/Psych: Mood is stable, no new changes  All other systems were reviewed with the patient and are negative.  MEDICAL HISTORY:  Past Medical History:  Diagnosis Date   Adenocarcinoma, colon ([place]) dx'd 01/2018   Anemia    taking iron supplements   Anxiety    Atrial fibrillation with RVR (HCC)    Colonic obstruction ([place]) 01/10/2018   Depression    Diverticulitis    Dysrhythmia    afib   History of kidney stones     SURGICAL HISTORY: Past Surgical History:  Procedure Laterality Date   ANKLE SURGERY Left    when he was in college   COLON RESECTION N/A 01/11/2018   Procedure: LEFT COLON RESECTION, TAKEDOWN SPLENIC FLEXURE, COLOSTOMY;  Surgeon: [name], MD;  Location: [facility];  Service: General;  Laterality: N/A;   COLONOSCOPY  01/11/2018   Procedure: COLONOSCOPY;  Surgeon: Jackquline Denmark, MD;  Location: [facility];  Service: Endoscopy;;   colonscopy  05/10/2018   HERNIA REPAIR     HERNIA REPAIR  03/02/2019   EXPLORATORY LAPAROTOMY (N/A Abdomen)   INCISIONAL HERNIA REPAIR N/A 03/02/2019   Procedure: INCISIONAL HERNIA REPAIR , RECTORECTUS VS TAR HERNIA REPAIR;  Surgeon: Ralene Ok, MD;  Location: [place];  Service: General;  Laterality: N/A;   INSERTION OF MESH N/A 03/02/2019   Procedure: Insertion Of Mesh;  Surgeon: Ralene Ok, MD;  Location: [place];  Service: General;  Laterality: N/A;   LAPAROTOMY N/A 03/02/2019   Procedure: EXPLORATORY LAPAROTOMY;  Surgeon: Ralene Ok, MD;  Location: [place];  Service: General;  Laterality: N/A;   LYSIS OF ADHESION N/A 06/12/2018    Procedure: LYSIS OF ADHESIONS;  Surgeon: [name], MD;  Location: [facility];  Service: General;  Laterality: N/A;   LYSIS OF ADHESION N/A 03/02/2019   Procedure: Lysis Of Adhesion;  Surgeon: Ralene Ok, MD;  Location: [place];  Service: General;  Laterality: N/A;   PORTACATH PLACEMENT Right 03/07/2018   Procedure: INSERTION PORT-A-CATH RIGHT SUBCLAVIAN;  Surgeon: [name], MD;  Location: [place];  Service: General;  Laterality: Right;   PROCTOSCOPY N/A 06/12/2018   Procedure: RIGID PROCTOSCOPY;  Surgeon: [name], MD;  Location: [facility];  Service: General;  Laterality: N/A;   thumb surgery   2018   cyst removal    I have reviewed the social history and family history with the patient and they are unchanged from previous note.  ALLERGIES:  has No Known Allergies.  MEDICATIONS:  Current Outpatient Medications  Medication Sig Dispense Refill   amiodarone (PACERONE) 200 MG tablet Take 1 tablet (200 mg total) by mouth 2 (two) times daily. (Patient taking differently: Take 200 mg by mouth daily. ) 60 tablet 0   apixaban (ELIQUIS) 5 MG TABS tablet Take 1 tablet (5 mg total) by mouth 2 (two) times daily. 60 tablet 0   aspirin EC 81 MG tablet Take 81 mg by mouth daily.     atorvastatin (LIPITOR) 40 MG tablet Take 40 mg by mouth daily.     b complex vitamins tablet Take 1 tablet by mouth daily.     diltiazem (CARDIZEM CD) 240 MG 24 hr capsule Take 240 mg by mouth daily.     docusate sodium (COLACE) 100 MG capsule Take 100 mg by mouth 2 (two) times daily.     isosorbide mononitrate (IMDUR) 30 MG 24 hr tablet Take 30 mg by mouth daily.     levothyroxine (SYNTHROID) 100 MCG tablet Take 100 mcg by mouth daily before breakfast.     lidocaine-prilocaine (EMLA) cream Apply to affected area once (Patient taking differently: Apply 1 application topically daily as needed (prior to port being accessed.). ) 30 g 3   Multiple Vitamins-Iron (MULTIVITAMIN/IRON PO) Take 1 tablet by mouth  daily.      tamsulosin (FLOMAX) 0.4 MG CAPS capsule Take 0.4 mg by mouth 2 (two) times daily.      No current facility-administered medications for this visit.    PHYSICAL EXAMINATION: ECOG PERFORMANCE STATUS: 0 - Asymptomatic  Vitals:   07/16/19 1319  BP: 123/85  Pulse: 80  Resp: 18  Temp: 98.7 F (37.1 C)  SpO2: 97%   Filed Weights   07/16/19 1319  Weight: 234 lb (106.1 kg)    GENERAL:alert, no distress and comfortable SKIN: skin color, texture, turgor are normal, no rashes or significant lesions EYES: normal, Conjunctiva are pink and non-injected, sclera clear  NECK: supple, thyroid normal size, non-tender, without nodularity LYMPH:  no palpable lymphadenopathy in the cervical, axillary  LUNGS: clear to auscultation and percussion with normal breathing effort HEART: regular rate and no murmurs and no lower extremity edema (+) Mild afib ABDOMEN:abdomen soft, non-tender and normal bowel sounds, No hepatomegaly  Musculoskeletal:no cyanosis of digits and no clubbing  NEURO: alert & oriented x 3 with fluent speech, no focal motor/sensory deficits  LABORATORY DATA:  I have reviewed the data as listed CBC Latest Ref Rng & Units 07/16/2019 03/16/2019 03/03/2019  WBC 4.0 - 10.5 K/uL 6.0 7.1 10.5  Hemoglobin 13.0 - 17.0 g/dL 14.4 12.9(L) 13.1  Hematocrit 39.0 - 52.0 % 43.0 39.4 40.6  Platelets 150 - 400 K/uL 172 263 178     CMP Latest Ref Rng & Units 07/16/2019 03/16/2019 03/02/2019  Glucose 70 - 99 mg/dL 99 92 102(H)  BUN 8 - 23 mg/dL 25(H) 21 15  Creatinine 0.61 - 1.24 mg/dL 1.29(H) 1.27(H) 1.39(H)  Sodium 135 - 145 mmol/L 142 141 139  Potassium 3.5 - 5.1 mmol/L 4.4 4.5 4.0  Chloride 98 - 111 mmol/L 106 103 103  CO2 22 - 32 mmol/L _0 Calcium 8.9 - 10.3 mg/dL 9.1 9.2 9.1  Total Protein 6.5 - 8.1 g/dL 7.0 7.1 -  Total Bilirubin 0.3 - 1.2 mg/dL 0.5 0.5 -  Alkaline Phos 38 - 126 U/L 80 107 -  AST 15 - 41 U/L 17 23 -  ALT 0 - 44 U/L 16 23 -      RADIOGRAPHIC  STUDIES: I have personally reviewed the radiological images as listed and agreed with the findings in the report. No results found.   ASSESSMENT & PLAN:  Dave Vaughn is a 74 y.o. male with   1. Cancer of left colon,adenocarcinoma, stage IIIB(pT3N1cM0), MSI-stable -Diagnosed in 01/2018. Treated with surgery andadjuvantFOLFOX. Due to side effects Oxaliplatin was stopped after 3 cycles and chemo was stopped after 6 cycles. He declined completing 6 months of chemo treatment. -I advised him to keep his port for a year due torisk ofcancer recurrence. He agrees. We will keep it and flush every6-8weeks due to COVID-19. -He underwent hernia repair in 02/2019. He recovered well.  -He is clinically doing well. He has residual numbness of left foot from prior chemo. I encouraged him to watch for fall. Labs reviewed, CBC and CMP WNL except Cr 1.29. CEA still pending. Physical exam unremarkable with mild afib. There is no clinical concern for recurrence.  -He is 1.5 years from his diagnosis. Continue surveillance, next scan in 11/2019. He should be fine to have PAC removed after 2 years or 3 years if her wants to keep it later. Continue flushes every 2 months for now.  -F/u in 4 months -I encouraged him proceed with COVID19 vaccine and continue COVID precautions.    2. Atrial Fibrillation -continue Eliquis. Rate controlled. -He will continue to follow up with his cardiologist. -He has been on amiodarone since his colon cancer surgery. I suggest he ask Dr Lyla Son about stopping the medication. He does have mild afib on exam today (07/16/19) -He plans to have fasting lipid panel done soon.   3.CKD stage III -he has developed mild increased Cr since he started chemo, EGFR around 40-50's -I encouraged him to control his blood pressure, cholesterol and BG. I also encouraged him to drink plenty of water. -Will avoid NSAIDs and CT IV contrast if GFR low   Plan -Flush in 2 months  -F/u in  4 months with Lab, flush and CT CAP w contrast (if GFR> 50) a few days before.    No problem-specific Assessment & Plan notes found for this encounter.   Orders Placed This Encounter  Procedures   CT Abdomen Pelvis W Contrast    Hold IV contrast if EGFR<50    Standing Status:   Future    Standing Expiration Date:   07/15/2020  Order Specific Question:   If indicated for the ordered procedure, I authorize the administration of contrast media per Radiology protocol    Answer:   Yes    Order Specific Question:   Preferred imaging location?    Answer:   [facility]    Order Specific Question:   Release to patient    Answer:   Immediate    Order Specific Question:   Is Oral Contrast requested for this exam?    Answer:   Yes, Per Radiology protocol    Order Specific Question:   Radiology Contrast Protocol - do NOT remove file path    Answer:   \[email or web address]   CT Chest W Contrast    Hold IV contrast if EGFR<50    Standing Status:   Future    Standing Expiration Date:   07/15/2020    Order Specific Question:   If indicated for the ordered procedure, I authorize the administration of contrast media per Radiology protocol    Answer:   Yes    Order Specific Question:   Preferred imaging location?    Answer:   [facility]    Order Specific Question:   Radiology Contrast Protocol - do NOT remove file path    Answer:   \[email or web address]   All questions were answered. The patient knows to call the clinic with any problems, questions or concerns. No barriers to learning was detected. The total time spent in the appointment was 30 minutes.     [name], MD 07/16/2019   I, Joslyn Devon, am acting as scribe for [name], MD.   I have reviewed the above documentation for accuracy and completeness, and I agree with the above.

## 2019-07-16 ENCOUNTER — Inpatient Hospital Stay: Payer: Commercial Managed Care - PPO

## 2019-07-16 ENCOUNTER — Inpatient Hospital Stay (HOSPITAL_BASED_OUTPATIENT_CLINIC_OR_DEPARTMENT_OTHER): Payer: Commercial Managed Care - PPO | Admitting: Hematology

## 2019-07-16 ENCOUNTER — Encounter: Payer: Self-pay | Admitting: Hematology

## 2019-07-16 ENCOUNTER — Other Ambulatory Visit: Payer: Self-pay

## 2019-07-16 ENCOUNTER — Inpatient Hospital Stay: Payer: Commercial Managed Care - PPO | Attending: Hematology

## 2019-07-16 VITALS — BP 123/85 | HR 80 | Temp 98.7°F | Resp 18 | Ht 72.0 in | Wt 234.0 lb

## 2019-07-16 DIAGNOSIS — Z7901 Long term (current) use of anticoagulants: Secondary | ICD-10-CM | POA: Diagnosis not present

## 2019-07-16 DIAGNOSIS — I4891 Unspecified atrial fibrillation: Secondary | ICD-10-CM | POA: Insufficient documentation

## 2019-07-16 DIAGNOSIS — C186 Malignant neoplasm of descending colon: Secondary | ICD-10-CM

## 2019-07-16 DIAGNOSIS — Z95828 Presence of other vascular implants and grafts: Secondary | ICD-10-CM

## 2019-07-16 DIAGNOSIS — N183 Chronic kidney disease, stage 3 unspecified: Secondary | ICD-10-CM | POA: Diagnosis not present

## 2019-07-16 DIAGNOSIS — Z85038 Personal history of other malignant neoplasm of large intestine: Secondary | ICD-10-CM | POA: Diagnosis not present

## 2019-07-16 LAB — CMP (CANCER CENTER ONLY)
ALT: 16 U/L (ref 0–44)
AST: 17 U/L (ref 15–41)
Albumin: 4.3 g/dL (ref 3.5–5.0)
Alkaline Phosphatase: 80 U/L (ref 38–126)
Anion gap: 8 (ref 5–15)
BUN: 25 mg/dL — ABNORMAL HIGH (ref 8–23)
CO2: 28 mmol/L (ref 22–32)
Calcium: 9.1 mg/dL (ref 8.9–10.3)
Chloride: 106 mmol/L (ref 98–111)
Creatinine: 1.29 mg/dL — ABNORMAL HIGH (ref 0.61–1.24)
GFR, Est AFR Am: >60 mL/min (ref >60)
GFR, Estimated: 55 mL/min — ABNORMAL LOW (ref >60)
Glucose, Bld: 99 mg/dL (ref 70–99)
Potassium: 4.4 mmol/L (ref 3.5–5.1)
Sodium: 142 mmol/L (ref 135–145)
Total Bilirubin: 0.5 mg/dL (ref 0.3–1.2)
Total Protein: 7 g/dL (ref 6.5–8.1)

## 2019-07-16 LAB — CBC WITH DIFFERENTIAL (CANCER CENTER ONLY)
Abs Immature Granulocytes: 0.02 10*3/uL (ref 0.00–0.07)
Basophils Absolute: 0 10*3/uL (ref 0.0–0.1)
Basophils Relative: 0 %
Eosinophils Absolute: 0.1 10*3/uL (ref 0.0–0.5)
Eosinophils Relative: 2 %
HCT: 43 % (ref 39.0–52.0)
Hemoglobin: 14.4 g/dL (ref 13.0–17.0)
Immature Granulocytes: 0 %
Lymphocytes Relative: 30 %
Lymphs Abs: 1.8 10*3/uL (ref 0.7–4.0)
MCH: 31.1 pg (ref 26.0–34.0)
MCHC: 33.5 g/dL (ref 30.0–36.0)
MCV: 92.9 fL (ref 80.0–100.0)
Monocytes Absolute: 0.4 10*3/uL (ref 0.1–1.0)
Monocytes Relative: 7 %
Neutro Abs: 3.7 10*3/uL (ref 1.7–7.7)
Neutrophils Relative %: 61 %
Platelet Count: 172 10*3/uL (ref 150–400)
RBC: 4.63 MIL/uL (ref 4.22–5.81)
RDW: 12.9 % (ref 11.5–15.5)
WBC Count: 6 10*3/uL (ref 4.0–10.5)
nRBC: 0 % (ref 0.0–0.2)

## 2019-07-16 LAB — CEA (IN HOUSE-CHCC): CEA (CHCC-In House): 7.47 ng/mL — ABNORMAL HIGH (ref 0.00–5.00)

## 2019-07-16 MED ORDER — HEPARIN SOD (PORK) LOCK FLUSH 100 UNIT/ML IV SOLN
500.0000 [IU] | Freq: Once | INTRAVENOUS | Status: AC | PRN
  Administered 2019-07-16: 500 [IU]
  Filled 2019-07-16: qty 5

## 2019-07-16 MED ORDER — SODIUM CHLORIDE 0.9% FLUSH
10.0000 mL | INTRAVENOUS | Status: DC | PRN
  Administered 2019-07-16: 13:00:00 10 mL
  Filled 2019-07-16: qty 10

## 2019-07-17 ENCOUNTER — Telehealth: Payer: Self-pay | Admitting: Hematology

## 2019-07-17 NOTE — Telephone Encounter (Signed)
Scheduled appt per 2/8 los.  Sent a message to HIM pool to get a calendar mailed out. 

## 2019-07-18 NOTE — Telephone Encounter (Signed)
Scheduling message sent for lab only appt week of 08/13/2019.

## 2019-07-20 ENCOUNTER — Telehealth: Payer: Self-pay | Admitting: Hematology

## 2019-07-20 NOTE — Telephone Encounter (Signed)
Scheduled appt per 2/10 sch message - unable to reach pt - left message with appt date and time

## 2019-08-13 ENCOUNTER — Inpatient Hospital Stay: Payer: Commercial Managed Care - PPO | Attending: Hematology

## 2019-08-13 ENCOUNTER — Telehealth: Payer: Self-pay

## 2019-08-13 ENCOUNTER — Other Ambulatory Visit: Payer: Self-pay

## 2019-08-13 DIAGNOSIS — Z85038 Personal history of other malignant neoplasm of large intestine: Secondary | ICD-10-CM | POA: Insufficient documentation

## 2019-08-13 DIAGNOSIS — C186 Malignant neoplasm of descending colon: Secondary | ICD-10-CM

## 2019-08-13 LAB — CMP (CANCER CENTER ONLY)
ALT: 16 U/L (ref 0–44)
AST: 19 U/L (ref 15–41)
Albumin: 4.3 g/dL (ref 3.5–5.0)
Alkaline Phosphatase: 79 U/L (ref 38–126)
Anion gap: 10 (ref 5–15)
BUN: 21 mg/dL (ref 8–23)
CO2: 29 mmol/L (ref 22–32)
Calcium: 9.4 mg/dL (ref 8.9–10.3)
Chloride: 104 mmol/L (ref 98–111)
Creatinine: 1.43 mg/dL — ABNORMAL HIGH (ref 0.61–1.24)
GFR, Est AFR Am: 56 mL/min — ABNORMAL LOW (ref >60)
GFR, Estimated: 49 mL/min — ABNORMAL LOW (ref >60)
Glucose, Bld: 99 mg/dL (ref 70–99)
Potassium: 4.6 mmol/L (ref 3.5–5.1)
Sodium: 143 mmol/L (ref 135–145)
Total Bilirubin: 0.6 mg/dL (ref 0.3–1.2)
Total Protein: 6.9 g/dL (ref 6.5–8.1)

## 2019-08-13 LAB — CBC WITH DIFFERENTIAL (CANCER CENTER ONLY)
Abs Immature Granulocytes: 0.01 10*3/uL (ref 0.00–0.07)
Basophils Absolute: 0 10*3/uL (ref 0.0–0.1)
Basophils Relative: 1 %
Eosinophils Absolute: 0.1 10*3/uL (ref 0.0–0.5)
Eosinophils Relative: 2 %
HCT: 43.1 % (ref 39.0–52.0)
Hemoglobin: 14 g/dL (ref 13.0–17.0)
Immature Granulocytes: 0 %
Lymphocytes Relative: 31 %
Lymphs Abs: 1.7 10*3/uL (ref 0.7–4.0)
MCH: 30.3 pg (ref 26.0–34.0)
MCHC: 32.5 g/dL (ref 30.0–36.0)
MCV: 93.3 fL (ref 80.0–100.0)
Monocytes Absolute: 0.4 10*3/uL (ref 0.1–1.0)
Monocytes Relative: 8 %
Neutro Abs: 3.2 10*3/uL (ref 1.7–7.7)
Neutrophils Relative %: 58 %
Platelet Count: 176 10*3/uL (ref 150–400)
RBC: 4.62 MIL/uL (ref 4.22–5.81)
RDW: 12.6 % (ref 11.5–15.5)
WBC Count: 5.4 10*3/uL (ref 4.0–10.5)
nRBC: 0 % (ref 0.0–0.2)

## 2019-08-13 LAB — CEA (IN HOUSE-CHCC): CEA (CHCC-In House): 9.32 ng/mL — ABNORMAL HIGH (ref 0.00–5.00)

## 2019-08-13 NOTE — Telephone Encounter (Signed)
Per Jerlyn Ly, LPN "patient left voicemail stating that he wanted Dr Burr Medico to look at his CEA from today. he said that he has changed his diet to a high protein diet and has been eating alot of red meat and wants to know if that contributed to the incease. or is there anything that he needs to be concerned about"

## 2019-08-13 NOTE — Telephone Encounter (Signed)
patient left voicemail stating that he wanted Dr Burr Medico to look at his CEA from today. he said that he has changed his diet to a high protein diet and has been eating alot of red meat and wants to know if that contributed to the incease. or is there anything that he needs to be concerned about. I forwarded this message to Dr Lewayne Bunting Nurse Ihor Gully RN so that she could follow up with patients concern

## 2019-08-14 NOTE — Telephone Encounter (Signed)
Lacie, could you review and call him back? Thanks   [name] MD

## 2019-08-15 ENCOUNTER — Encounter: Payer: Self-pay | Admitting: Hematology

## 2019-08-30 ENCOUNTER — Other Ambulatory Visit: Payer: Self-pay | Admitting: Hematology

## 2019-08-30 ENCOUNTER — Telehealth: Payer: Self-pay

## 2019-08-30 NOTE — Telephone Encounter (Signed)
I spoke with Dave Vaughn today regarding is worsening kidney function.  Per Dr. Burr Medico patient is to increase his water intake.  I told him Dr. Burr Medico will look at his kidney function on his pre scan labs and may need to change to ct without contrast.  He verbalized understanding.

## 2019-09-03 ENCOUNTER — Encounter: Payer: Commercial Managed Care - PPO | Admitting: Family Medicine

## 2019-09-07 ENCOUNTER — Ambulatory Visit: Payer: Commercial Managed Care - PPO | Attending: Internal Medicine

## 2019-09-07 DIAGNOSIS — Z23 Encounter for immunization: Secondary | ICD-10-CM

## 2019-09-07 NOTE — Progress Notes (Signed)
   Covid-19 Vaccination Clinic  Name:  Dave Vaughn    MRN: [id number] DOB: July 22, 1945  09/07/2019  Dave Vaughn was observed post Covid-19 immunization for 15 minutes without incident. He was provided with Vaccine Information Sheet and instruction to access the V-Safe system.   Dave Vaughn was instructed to call 911 with any severe reactions post vaccine: [name] Difficulty breathing  . Swelling of face and throat  . A fast heartbeat  . A bad rash all over body  . Dizziness and weakness   Immunizations Administered    Name Date Dose VIS Date Route   Pfizer COVID-19 Vaccine 09/07/2019  3:22 PM 0.3 mL 05/18/2019 Intramuscular   Manufacturer: [employer]   Lot: [id number]   [place]: [id number]

## 2019-09-10 ENCOUNTER — Inpatient Hospital Stay: Payer: Commercial Managed Care - PPO

## 2019-09-10 ENCOUNTER — Other Ambulatory Visit: Payer: Self-pay

## 2019-09-10 ENCOUNTER — Other Ambulatory Visit: Payer: Self-pay | Admitting: Hematology

## 2019-09-10 ENCOUNTER — Inpatient Hospital Stay: Payer: Commercial Managed Care - PPO | Attending: Hematology

## 2019-09-10 DIAGNOSIS — Z452 Encounter for adjustment and management of vascular access device: Secondary | ICD-10-CM | POA: Diagnosis not present

## 2019-09-10 DIAGNOSIS — Z85038 Personal history of other malignant neoplasm of large intestine: Secondary | ICD-10-CM | POA: Diagnosis not present

## 2019-09-10 DIAGNOSIS — C186 Malignant neoplasm of descending colon: Secondary | ICD-10-CM

## 2019-09-10 DIAGNOSIS — I4891 Unspecified atrial fibrillation: Secondary | ICD-10-CM | POA: Insufficient documentation

## 2019-09-10 DIAGNOSIS — N183 Chronic kidney disease, stage 3 unspecified: Secondary | ICD-10-CM | POA: Insufficient documentation

## 2019-09-10 DIAGNOSIS — Z95828 Presence of other vascular implants and grafts: Secondary | ICD-10-CM

## 2019-09-10 LAB — CBC WITH DIFFERENTIAL (CANCER CENTER ONLY)
Abs Immature Granulocytes: 0.01 10*3/uL (ref 0.00–0.07)
Basophils Absolute: 0 10*3/uL (ref 0.0–0.1)
Basophils Relative: 1 %
Eosinophils Absolute: 0.1 10*3/uL (ref 0.0–0.5)
Eosinophils Relative: 2 %
HCT: 41.6 % (ref 39.0–52.0)
Hemoglobin: 13.6 g/dL (ref 13.0–17.0)
Immature Granulocytes: 0 %
Lymphocytes Relative: 28 %
Lymphs Abs: 1.3 10*3/uL (ref 0.7–4.0)
MCH: 30.7 pg (ref 26.0–34.0)
MCHC: 32.7 g/dL (ref 30.0–36.0)
MCV: 93.9 fL (ref 80.0–100.0)
Monocytes Absolute: 0.3 10*3/uL (ref 0.1–1.0)
Monocytes Relative: 7 %
Neutro Abs: 3 10*3/uL (ref 1.7–7.7)
Neutrophils Relative %: 62 %
Platelet Count: 186 10*3/uL (ref 150–400)
RBC: 4.43 MIL/uL (ref 4.22–5.81)
RDW: 12.7 % (ref 11.5–15.5)
WBC Count: 4.8 10*3/uL (ref 4.0–10.5)
nRBC: 0 % (ref 0.0–0.2)

## 2019-09-10 LAB — CMP (CANCER CENTER ONLY)
ALT: 14 U/L (ref 0–44)
AST: 15 U/L (ref 15–41)
Albumin: 4.2 g/dL (ref 3.5–5.0)
Alkaline Phosphatase: 85 U/L (ref 38–126)
Anion gap: 9 (ref 5–15)
BUN: 24 mg/dL — ABNORMAL HIGH (ref 8–23)
CO2: 28 mmol/L (ref 22–32)
Calcium: 9.2 mg/dL (ref 8.9–10.3)
Chloride: 105 mmol/L (ref 98–111)
Creatinine: 1.5 mg/dL — ABNORMAL HIGH (ref 0.61–1.24)
GFR, Est AFR Am: 53 mL/min — ABNORMAL LOW (ref >60)
GFR, Estimated: 46 mL/min — ABNORMAL LOW (ref >60)
Glucose, Bld: 106 mg/dL — ABNORMAL HIGH (ref 70–99)
Potassium: 4.1 mmol/L (ref 3.5–5.1)
Sodium: 142 mmol/L (ref 135–145)
Total Bilirubin: 0.6 mg/dL (ref 0.3–1.2)
Total Protein: 6.9 g/dL (ref 6.5–8.1)

## 2019-09-10 LAB — CEA (IN HOUSE-CHCC): CEA (CHCC-In House): 11.36 ng/mL — ABNORMAL HIGH (ref 0.00–5.00)

## 2019-09-10 MED ORDER — HEPARIN SOD (PORK) LOCK FLUSH 100 UNIT/ML IV SOLN
500.0000 [IU] | Freq: Once | INTRAVENOUS | Status: AC | PRN
  Administered 2019-09-10: 500 [IU]
  Filled 2019-09-10: qty 5

## 2019-09-10 MED ORDER — SODIUM CHLORIDE 0.9% FLUSH
10.0000 mL | INTRAVENOUS | Status: DC | PRN
  Administered 2019-09-10: 10 mL
  Filled 2019-09-10: qty 10

## 2019-09-10 NOTE — Patient Instructions (Signed)
Tunneled Central Venous Catheter Flushing Guide  It is important to flush your tunneled central venous catheter each time you use it, both before and after you use it. Flushing your catheter will help prevent it from clogging. What are the risks? Risks may include:  Infection.  Air getting into the catheter and bloodstream. Supplies needed:  A clean pair of gloves.  A disinfecting wipe. Use an alcohol wipe, chlorhexidine wipe, or iodine wipe as told by your health care provider.  A 10 mL syringe that has been prefilled with saline solution.  An empty 10 mL syringe, if a substance called heparin was injected into your catheter. How to flush your catheter When you flush your catheter, make sure you follow any specific instructions from your health care provider or the manufacturer. These are general guidelines. Flushing your catheter before use If there is heparin in your catheter: 1. Wash your hands with soap and water. 2. Put on gloves. 3. Scrub the injection cap for a minimum of 15 seconds with a disinfecting wipe. 4. Unclamp the catheter. 5. Attach the empty syringe to the injection cap. 6. Pull the syringe plunger back and withdraw 10 mL of blood. 7. Place the syringe into an appropriate waste container. 8. Scrub the injection cap for 15 seconds with a disinfecting wipe. 9. Attach the prefilled syringe to the injection cap. 10. Flush the catheter by pushing the plunger forward until all the liquid from the syringe is in the catheter. 11. Remove the syringe from the injection cap. 12. Clamp the catheter. If there is no heparin in your catheter: 1. Wash your hands with soap and water. 2. Put on gloves. 3. Scrub the injection cap for 15 seconds with a disinfecting wipe. 4. Unclamp the catheter. 5. Attach the prefilled syringe to the injection cap. 6. Flush the catheter by pushing the plunger forward until 5 mL of the liquid from the syringe is in the catheter. 7. Pull back on  the syringe until you see blood in the catheter. 8. If you have been asked to collect any blood, follow your health care provider's instructions. Otherwise, flush the catheter with the rest of the solution from the syringe. 9. Remove the syringe from the injection cap. 10. Clamp the catheter.  Flushing your catheter after use 1. Wash your hands with soap and water. 2. Put on gloves. 3. Scrub the injection cap for 15 seconds with a disinfecting wipe. 4. Unclamp the catheter. 5. Attach the prefilled syringe to the injection cap. 6. Flush the catheter by pushing the plunger forward until all of the liquid from the syringe is in the catheter. 7. Remove the syringe from the injection cap. 8. Clamp the catheter. Problems and solutions  If blood cannot be completely cleared from the injection cap, you may need to have the injection cap replaced.  If the catheter is difficult to flush, use the pulsing method. The pulsing method involves pushing only a few milliliters of solution into the catheter at a time and pausing between pushes.  If you do not see blood in the catheter when you pull back on the syringe, change your body position, such as by raising your arms above your head. Take a deep breath and cough. Then, pull back on the syringe. If you still do not see blood, flush the catheter with a small amount of solution. Then, change positions again and take a breath or cough. Pull back on the syringe again. If you still do not see   blood, finish flushing the catheter and contact your health care provider. Do not use your catheter until your health care provider says it is okay. General tips  Have someone help you flush your catheter, if possible.  Do not force fluid through your catheter.  Do not use a syringe that is larger or smaller than 10 mL. Using a smaller syringe can make the catheter burst.  Do not use your catheter without flushing it first if it has heparin in it. Contact a health  care provider if:  You cannot see any blood in the catheter when you flush it before using it.  Your catheter is difficult to flush. Get help right away if:  You cannot flush the catheter.  The catheter leaks when you flush it or when there is fluid in it.  There are cracks or breaks in the catheter. Summary  It is important to flush your tunneled central venous catheter each time you use it, both before and after you use it.  Scrub the injection cap for 15 seconds with a disinfecting wipe before and after you flush it.  When you flush your catheter, make sure you follow any specific instructions from your health care provider or the manufacturer.  Get help right away if you cannot flush the catheter. This information is not intended to replace advice given to you by your health care provider. Make sure you discuss any questions you have with your health care provider. Document Revised: 02/16/2019 Document Reviewed: 08/09/2018 Elsevier Patient Education  2020 Elsevier Inc.  

## 2019-09-12 ENCOUNTER — Other Ambulatory Visit: Payer: Self-pay

## 2019-09-13 ENCOUNTER — Telehealth: Payer: Self-pay

## 2019-09-13 ENCOUNTER — Ambulatory Visit (HOSPITAL_COMMUNITY): Admission: RE | Admit: 2019-09-13 | Payer: Commercial Managed Care - PPO | Source: Ambulatory Visit

## 2019-09-13 NOTE — Telephone Encounter (Signed)
I received a call from [facility] in radiology stating the Dave Vaughn cancelled his ct scan for today stating he was ill.  The ct scan was rescheduled for 09/20/19 I spoke with Dave Vaughn he states he has nausea and vomiting afebrile, states "I have a stomach bug".  I told him I will reschedule his video visit with Dr. Burr Medico tomorrow to 09/21/2019 at [place].  He verbalized understanding.

## 2019-09-14 ENCOUNTER — Telehealth: Payer: Commercial Managed Care - PPO | Admitting: Hematology

## 2019-09-20 ENCOUNTER — Ambulatory Visit (HOSPITAL_COMMUNITY): Payer: Commercial Managed Care - PPO

## 2019-09-21 ENCOUNTER — Telehealth: Payer: Self-pay | Admitting: Hematology

## 2019-09-21 ENCOUNTER — Telehealth: Payer: Commercial Managed Care - PPO | Admitting: Hematology

## 2019-09-21 NOTE — Telephone Encounter (Signed)
Moved 4/16 appt to 4/20 per sch msg. Called and left msg. Mailed printout

## 2019-10-01 ENCOUNTER — Ambulatory Visit: Payer: Commercial Managed Care - PPO | Attending: Internal Medicine

## 2019-10-01 DIAGNOSIS — Z23 Encounter for immunization: Secondary | ICD-10-CM

## 2019-10-01 NOTE — Progress Notes (Signed)
   Covid-19 Vaccination Clinic  Name:  Dave Vaughn    MRN: [id number] DOB: 26-Oct-1945  10/01/2019  Dave Vaughn was observed post Covid-19 immunization for 15 minutes without incident. He was provided with Vaccine Information Sheet and instruction to access the V-Safe system.   Dave Vaughn was instructed to call 911 with any severe reactions post vaccine: [name] Difficulty breathing  . Swelling of face and throat  . A fast heartbeat  . A bad rash all over body  . Dizziness and weakness   Immunizations Administered    Name Date Dose VIS Date Route   Pfizer COVID-19 Vaccine 10/01/2019  3:54 PM 0.3 mL 08/01/2018 Intramuscular   Manufacturer: [place]   Lot: [id number]   [place]: [id number]

## 2019-10-03 ENCOUNTER — Encounter (HOSPITAL_COMMUNITY): Payer: Self-pay

## 2019-10-03 ENCOUNTER — Ambulatory Visit (HOSPITAL_COMMUNITY)
Admission: RE | Admit: 2019-10-03 | Discharge: 2019-10-03 | Disposition: A | Discharge status: Home / Self Care | Payer: Commercial Managed Care - PPO | Source: Ambulatory Visit | Attending: Hematology | Admitting: Hematology

## 2019-10-03 ENCOUNTER — Other Ambulatory Visit: Payer: Self-pay

## 2019-10-03 DIAGNOSIS — C186 Malignant neoplasm of descending colon: Secondary | ICD-10-CM | POA: Diagnosis not present

## 2019-10-03 MED ORDER — IOHEXOL 9 MG/ML PO SOLN
ORAL | Status: AC
  Filled 2019-10-03: qty 1000

## 2019-10-03 NOTE — Progress Notes (Signed)
[place]   [phone] Fax:(336) [phone]   Clinic Follow up Note   Patient Care Team: [name], MD as PCP - General (Family Medicine) [name], MD as Consulting Physician (Cardiology) [name], MD as Consulting Physician (Gastroenterology) [name], MD as Consulting Physician (General Surgery) [name], MD as Consulting Physician (General Surgery) [name], MD as Consulting Physician (Urology) [name], MD as Consulting Physician (Medical Oncology)   I connected with Hetty Ely on 10/05/2019 at  9:20 AM EDT by video enabled telemedicine visit and verified that I am speaking with the correct person using two identifiers.  I discussed the limitations, risks, security and privacy concerns of performing an evaluation and management service by telephone and the availability of in person appointments. I also discussed with the patient that there may be a patient responsible charge related to this service. The patient expressed understanding and agreed to proceed.   Other persons participating in the visit and their role in the encounter:  Pt's wife  Patient's location:  Home  Provider's location:  Office   CHIEF COMPLAINT: F/u on colon cancer  SUMMARY OF ONCOLOGIC HISTORY: Oncology History Overview Note  Cancer Staging Cancer of left colon [facility]) Staging form: Colon and Rectum, AJCC 8th Edition - Pathologic stage from 01/11/2018: Stage IIIB (pT3, pN1c, cM0) - Signed by [name], MD on 01/16/2018     Cancer of left colon ([place])  01/10/2018 Imaging   CT AP W Contrast 01/10/18  IMPRESSION: Irregular soft tissue density causing stricture of the mid descending colon likely the site of obstruction for the dilated small bowel. 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 No evidence of perforation.  Equivocal findings involving the appendix measuring 1.2 cm at the appendiceal tip with mucosal enhancement. No  adjacent free fluid or inflammatory change. Findings are nonspecific, but can be seen with early acute appendicitis.  Mild prostatic enlargement. Increased density over the posterior bladder base likely due to the large prostatic impression although cannot completely exclude a bladder mass. Urology protocol CT or ultrasound may be helpful for better evaluation.  Mild cholelithiasis.  Stable 1.5 cm cystic structure over the lower pole right kidney likely slightly hyperdense cyst.  Diverticulosis of the colon.  Aortic Atherosclerosis (ICD10-I70.0).     01/11/2018 Cancer Staging   Staging form: Colon and Rectum, AJCC 8th Edition - Pathologic stage from 01/11/2018: Stage IIIB (pT3, pN1c, cM0) - Signed by [name], MD on 01/16/2018   01/11/2018 Surgery   LEFT COLON RESECTION, TAKEDOWN SPLENIC FLEXURE, COLOSTOMY by Dr. [name]    01/11/2018 Procedure   Colonoscopy 01/11/18 by Dr. [name]  - Malignant completely obstructing tumor in the mid descending colon. Tattooed. - Diverticulosis in the sigmoid colon. - Non-bleeding internal hemorrhoids. - No specimens collected.   01/11/2018 Pathology Results   Diagnosis 01/11/18  1. Colon, segmental resection for tumor, descending colon - INVASIVE COLORECTAL ADENOCARCINOMA, 4 CM. - TUMOR EXTENDS INTO PERICOLONIC CONNECTIVE TISSUE. - TUMOR FOCALLY INVOLVES RADIAL MARGIN. - ONE MESENTERIC TUMOR DEPOSIT. - THIRTEEN BENIGN LYMPH NODES (0/13). 2. Colon, segmental resection, splenic flexure - BENIGN COLON. - NO EVIDENCE OF MALIGNANCY .   01/11/2018 Tumor Marker   Baseline CEA at 3.4   01/16/2018 Initial Diagnosis   Cancer of left colon ([place])   01/23/2018 Imaging   CT CHEST WO CONTRAST IMPRESSION: 1. No evidence for metastatic disease within the chest. 2. Small left pleural effusion with underlying opacities which may represent atelectasis. Right basilar  atelectasis. 3. Few foci of gas within the upper abdomen in the omentum with surrounding fat  stranding, likely postsurgical 4. Aortic Atherosclerosis (ICD10-I70.0).   03/08/2018 - 05/15/2018 Chemotherapy   adjuvant FOLOFX. Due to side effects of neuropahty Oxaliplatin was stopped after 3 cycles and chemo was stopped after 6 cycles. He declined completing 6 months of chemo treatment.     03/19/2018 Imaging   03/19/2018 CT AP IMPRESSION: 1. Interval partial left hemicolectomy and descending colostomy. 2. Heterogeneous soft tissue density along the left anterior renal fascia is most likely postoperative (favor fat necrosis). No well-defined fluid collection. 3. Mild left lower quadrant edema, new since 01/10/2018. This could be postoperative. Superimposed sigmoid diverticulitis and/or cystitis cannot be excluded. 4. Subtle hyperenhancing nodule within the anterior bladder dome cannot be excluded. Consider nonemergent cystoscopy. When this is performed, recommend attention to the left ureterovesicular junction and distal left ureter to evaluate questionable soft tissue fullness. 5. Cholelithiasis. 6.  Aortic Atherosclerosis (ICD10-I70.0). 7. Prostatomegaly.   11/13/2018 Imaging   CT CAP WO Contrast 11/13/18  IMPRESSION: 1. Reversal of left lower quadrant colostomy with sigmoid colon anastomosis. No complicating features. No findings for residual or recurrent tumor or metastatic disease involving the chest, abdomen or pelvis without contrast. 2. No acute abdominal/pelvic findings. 3. Gallbladder sludge and gallstones but no findings for acute cholecystitis. 4. Stable anterior abdominal wall hernia. 5. The right testicle is in the right inguinal canal.   10/03/2019 Imaging   CT CAP WO contrast  IMPRESSION: 1. New rounded density interposed between the prostate gland and anterior upper rectal wall could represent adenopathy or local extension of anterior rectal tumor. 2. Marked prostatomegaly, prostate volume 150 cubic cm. 3. Other imaging findings of potential clinical  significance: Aortic Atherosclerosis (ICD10-I70.0). Coronary atherosclerosis. Trace right pleural effusion. Suspected cholelithiasis. Nonobstructive left nephrolithiasis. Multilevel lumbar impingement. Bilateral mildly retracted testicles. Hypodense exophytic lesion of the right kidney, most likely to be a cyst.      CURRENT THERAPY:  Surveillance  INTERVAL HISTORY:  REEF ACHTERBERG is here for a follow up of colon cancer. They identified themselves by face to face video.  Richardson Landry is doing well clinically, denies any pain, abdominal discomfort, change of bowel habit or other new symptoms.  He has good appetite and energy level, weight has been stable, no other complaints.  Review of system otherwise negative.  MEDICAL HISTORY:  Past Medical History:  Diagnosis Date  . Adenocarcinoma, colon ([place]) dx'd 01/2018  . Anemia    taking iron supplements  . Anxiety   . Atrial fibrillation with RVR ([place])   . Colonic obstruction ([place]) 01/10/2018  . Depression   . Diverticulitis   . Dysrhythmia    afib  . History of kidney stones     SURGICAL HISTORY: Past Surgical History:  Procedure Laterality Date  . ANKLE SURGERY Left    when he was in college  . COLON RESECTION N/A 01/11/2018   Procedure: LEFT COLON RESECTION, TAKEDOWN SPLENIC FLEXURE, COLOSTOMY;  Surgeon: [name], MD;  Location: [facility];  Service: General;  Laterality: N/A;  . COLONOSCOPY  01/11/2018   Procedure: COLONOSCOPY;  Surgeon: Jackquline Denmark, MD;  Location: [facility];  Service: Endoscopy;;  . colonscopy  05/10/2018  . HERNIA REPAIR    . HERNIA REPAIR  03/02/2019   EXPLORATORY LAPAROTOMY (N/A Abdomen)  . INCISIONAL HERNIA REPAIR N/A 03/02/2019   Procedure: INCISIONAL HERNIA REPAIR , RECTORECTUS VS TAR HERNIA REPAIR;  Surgeon: Ralene Ok, MD;  Location: [place];  Service: General;  Laterality: N/A;  . INSERTION OF MESH N/A 03/02/2019   Procedure: Insertion Of Mesh;  Surgeon: Ralene Ok, MD;  Location: [place];   Service: General;  Laterality: N/A;  . LAPAROTOMY N/A 03/02/2019   Procedure: EXPLORATORY LAPAROTOMY;  Surgeon: Ralene Ok, MD;  Location: [place];  Service: General;  Laterality: N/A;  . LYSIS OF ADHESION N/A 06/12/2018   Procedure: LYSIS OF ADHESIONS;  Surgeon: [name], MD;  Location: [facility];  Service: General;  Laterality: N/A;  . LYSIS OF ADHESION N/A 03/02/2019   Procedure: Lysis Of Adhesion;  Surgeon: Ralene Ok, MD;  Location: [place];  Service: General;  Laterality: N/A;  . PORTACATH PLACEMENT Right 03/07/2018   Procedure: INSERTION PORT-A-CATH RIGHT SUBCLAVIAN;  Surgeon: [name], MD;  Location: [place];  Service: General;  Laterality: Right;  . PROCTOSCOPY N/A 06/12/2018   Procedure: RIGID PROCTOSCOPY;  Surgeon: [name], MD;  Location: [facility];  Service: General;  Laterality: N/A;  . thumb surgery   2018   cyst removal    I have reviewed the social history and family history with the patient and they are unchanged from previous note.  ALLERGIES:  has No Known Allergies.  MEDICATIONS:  Current Outpatient Medications  Medication Sig Dispense Refill  . amiodarone (PACERONE) 200 MG tablet Take 1 tablet (200 mg total) by mouth 2 (two) times daily. (Patient taking differently: Take 200 mg by mouth daily. ) 60 tablet 0  . apixaban (ELIQUIS) 5 MG TABS tablet Take 1 tablet (5 mg total) by mouth 2 (two) times daily. 60 tablet 0  . aspirin EC 81 MG tablet Take 81 mg by mouth daily.    [name] atorvastatin (LIPITOR) 40 MG tablet Take 40 mg by mouth daily.    [name] b complex vitamins tablet Take 1 tablet by mouth daily.    [name] diltiazem (CARDIZEM CD) 240 MG 24 hr capsule Take 240 mg by mouth daily.    [name] docusate sodium (COLACE) 100 MG capsule Take 100 mg by mouth 2 (two) times daily.    . ID NOW COVID-19 KIT See admin instructions.    . isosorbide mononitrate (IMDUR) 30 MG 24 hr tablet Take 30 mg by mouth daily.    [name] levothyroxine (SYNTHROID) 100 MCG tablet Take 100 mcg by mouth  daily before breakfast.    . levothyroxine (SYNTHROID) 125 MCG tablet Take 125 mcg by mouth daily.    [name] lidocaine-prilocaine (EMLA) cream Apply to affected area once (Patient taking differently: Apply 1 application topically daily as needed (prior to port being accessed.). ) 30 g 3  . Multiple Vitamins-Iron (MULTIVITAMIN/IRON PO) Take 1 tablet by mouth daily.     . tamsulosin (FLOMAX) 0.4 MG CAPS capsule Take 0.4 mg by mouth 2 (two) times daily.      No current facility-administered medications for this visit.    PHYSICAL EXAMINATION: ECOG PERFORMANCE STATUS: 0 - Asymptomatic  No vitals taken today, Exam not performed today   LABORATORY DATA:  I have reviewed the data as listed CBC Latest Ref Rng & Units 09/10/2019 08/13/2019 07/16/2019  WBC 4.0 - 10.5 K/uL 4.8 5.4 6.0  Hemoglobin 13.0 - 17.0 g/dL 13.6 14.0 14.4  Hematocrit 39.0 - 52.0 % 41.6 43.1 43.0  Platelets 150 - 400 K/uL 186 176 172     CMP Latest Ref Rng & Units 09/10/2019 08/13/2019 07/16/2019  Glucose 70 - 99 mg/dL 106(H) 99 99  BUN 8 - 23 mg/dL 24(H) 21 25(H)  Creatinine  0.61 - 1.24 mg/dL 1.50(H) 1.43(H) 1.29(H)  Sodium 135 - 145 mmol/L 142 143 142  Potassium 3.5 - 5.1 mmol/L 4.1 4.6 4.4  Chloride 98 - 111 mmol/L 105 104 106  CO2 22 - 32 mmol/L _0 Calcium 8.9 - 10.3 mg/dL 9.2 9.4 9.1  Total Protein 6.5 - 8.1 g/dL 6.9 6.9 7.0  Total Bilirubin 0.3 - 1.2 mg/dL 0.6 0.6 0.5  Alkaline Phos 38 - 126 U/L 85 79 80  AST 15 - 41 U/L _1 ALT 0 - 44 U/L _2 RADIOGRAPHIC STUDIES: I have personally reviewed the radiological images as listed and agreed with the findings in the report. CT Abdomen Pelvis Wo Contrast  Result Date: 10/03/2019 CLINICAL DATA:  Surveillance of colorectal cancer. Colostomy with reversal. Chemotherapy completed. EXAM: CT CHEST, ABDOMEN AND PELVIS WITHOUT CONTRAST TECHNIQUE: Multidetector CT imaging of the chest, abdomen and pelvis was performed following the standard protocol without IV  contrast. COMPARISON:  Multiple exams, including 11/13/2018 FINDINGS: CT CHEST FINDINGS Cardiovascular: Right Port-A-Cath tip: SVC. Atherosclerotic aortic arch. Faint atherosclerotic calcification of the left anterior descending coronary artery. Mediastinum/Nodes: No pathologic adenopathy. Lungs/Pleura: Trace right pleural effusion. Minimal biapical pleuroparenchymal scarring. Mild tree-in-bud nodularity in the right middle lobe adjacent to the diaphragm on image 103/6, stable and likely residua from prior inflammatory process. Musculoskeletal: Degenerative glenohumeral arthropathy bilaterally. Thoracic spondylosis. CT ABDOMEN PELVIS FINDINGS Hepatobiliary: Dependent densities in the gallbladder favoring gallstones. The liver appears otherwise normal. Pancreas: Unremarkable Spleen: Unremarkable Adrenals/Urinary Tract: 3 by 1 mm left kidney lower pole nonobstructive renal calculus on image 73/4. Exophytic right kidney lower pole 2.5 by 1.7 cm hypodense lesion, favoring a cyst although enhancement characteristics are not assessed today. Stomach/Bowel: Staple line in the sigmoid colon without complicating feature. No dilated bowel. Most appreciable on image 105 through 113 of series 5, there is a 1.8 by 1.5 by 1.8 cm density interposed between the prostate gland and the anterior rectal wall. A tumor of the anterior rectal wall, or adjacent adenopathy, is not readily excluded. Vascular/Lymphatic: Aortoiliac atherosclerotic vascular disease. Reproductive: The prostate gland measures 6.0 by 6.0 by 7.8 cm (volume = 150 cm^3) and significantly indents the bladder base. Other: No supplemental non-categorized findings. Musculoskeletal: Mild dextroconvex lumbar scoliosis with rotary component. Bilateral retracted testicles along the spermatic cords. Lumbar spondylosis and degenerative disc disease resulting in multilevel impingement. IMPRESSION: 1. New rounded density interposed between the prostate gland and anterior upper  rectal wall could represent adenopathy or local extension of anterior rectal tumor. 2. Marked prostatomegaly, prostate volume 150 cubic cm. 3. Other imaging findings of potential clinical significance: Aortic Atherosclerosis (ICD10-I70.0). Coronary atherosclerosis. Trace right pleural effusion. Suspected cholelithiasis. Nonobstructive left nephrolithiasis. Multilevel lumbar impingement. Bilateral mildly retracted testicles. Hypodense exophytic lesion of the right kidney, most likely to be a cyst. Electronically Signed   By: [name] M.D.   On: 10/03/2019 13:59   CT Chest Wo Contrast  Result Date: 10/03/2019 CLINICAL DATA:  Surveillance of colorectal cancer. Colostomy with reversal. Chemotherapy completed. EXAM: CT CHEST, ABDOMEN AND PELVIS WITHOUT CONTRAST TECHNIQUE: Multidetector CT imaging of the chest, abdomen and pelvis was performed following the standard protocol without IV contrast. COMPARISON:  Multiple exams, including 11/13/2018 FINDINGS: CT CHEST FINDINGS Cardiovascular: Right Port-A-Cath tip: SVC. Atherosclerotic aortic arch. Faint atherosclerotic calcification of the left anterior descending coronary artery. Mediastinum/Nodes: No pathologic adenopathy. Lungs/Pleura: Trace right pleural effusion. Minimal biapical pleuroparenchymal scarring. Mild tree-in-bud nodularity in the  right middle lobe adjacent to the diaphragm on image 103/6, stable and likely residua from prior inflammatory process. Musculoskeletal: Degenerative glenohumeral arthropathy bilaterally. Thoracic spondylosis. CT ABDOMEN PELVIS FINDINGS Hepatobiliary: Dependent densities in the gallbladder favoring gallstones. The liver appears otherwise normal. Pancreas: Unremarkable Spleen: Unremarkable Adrenals/Urinary Tract: 3 by 1 mm left kidney lower pole nonobstructive renal calculus on image 73/4. Exophytic right kidney lower pole 2.5 by 1.7 cm hypodense lesion, favoring a cyst although enhancement characteristics are not  assessed today. Stomach/Bowel: Staple line in the sigmoid colon without complicating feature. No dilated bowel. Most appreciable on image 105 through 113 of series 5, there is a 1.8 by 1.5 by 1.8 cm density interposed between the prostate gland and the anterior rectal wall. A tumor of the anterior rectal wall, or adjacent adenopathy, is not readily excluded. Vascular/Lymphatic: Aortoiliac atherosclerotic vascular disease. Reproductive: The prostate gland measures 6.0 by 6.0 by 7.8 cm (volume = 150 cm^3) and significantly indents the bladder base. Other: No supplemental non-categorized findings. Musculoskeletal: Mild dextroconvex lumbar scoliosis with rotary component. Bilateral retracted testicles along the spermatic cords. Lumbar spondylosis and degenerative disc disease resulting in multilevel impingement. IMPRESSION: 1. New rounded density interposed between the prostate gland and anterior upper rectal wall could represent adenopathy or local extension of anterior rectal tumor. 2. Marked prostatomegaly, prostate volume 150 cubic cm. 3. Other imaging findings of potential clinical significance: Aortic Atherosclerosis (ICD10-I70.0). Coronary atherosclerosis. Trace right pleural effusion. Suspected cholelithiasis. Nonobstructive left nephrolithiasis. Multilevel lumbar impingement. Bilateral mildly retracted testicles. Hypodense exophytic lesion of the right kidney, most likely to be a cyst. Electronically Signed   By: [name] M.D.   On: 10/03/2019 13:59     ASSESSMENT & PLAN:  Dave Vaughn is a 74 y.o. male with    1. Cancer of left colon,adenocarcinoma, stage IIIB(pT3N1cM0), MSI-stable -Diagnosed in 01/2018. Treated with surgery andadjuvantFOLFOX. Due to side effects Oxaliplatin was stopped after 3 cycles and chemo was stopped after 6 cycles. He declined completing 6 months of chemo treatment. -I advised him to keep his port for a year due torisk ofcancer recurrence. He agrees. We will  keep it and flush every6-8weeks due to COVID-19. -He underwent hernia repair in 02/2019. He recovered well.  -I personally reviewed and discussed his CT CAP from 10/03/19 which shows no definitive evidence of metastasis, except a soft tissue density in between prostate and rectum, indeterminate.  -His last colonoscopy was in December 2019, which showed mucosal inflammation in the rectum, biopsy was negative.  Dr. [name] Plan recommended repeat colonoscopy in a year.  I will reach out to him and request a repeated colonoscopy to ruled out local recurrence of colon cancer, or new colorectal cancer, due to the concern of rising tumor marker. -He has significant prostatomegaly, from BPH, he is following with urology, on Flomax.  Will review his CT scan with his urologist also. -If colonoscopy negative, I will repeat lab and pelvic MRI in 2 to 3 months for close f/u   2. Atrial Fibrillation -continue Eliquis. Rate controlled. -He will continue to follow up with his cardiologist. -He has been on amiodarone since his colon cancer surgery. I suggest he ask Dr Lyla Son about stopping the medication. He does have mild afib on exam today (07/16/19) -He plans to have fasting lipid panel done soon.   3.CKD stage III -he has developed mild increased Cr since he started chemo, EGFR around 40-50's -I encouraged him to control his blood pressure, cholesterol and BG. I also encouraged him  to drink plenty of water. -Will avoid NSAIDs and CT IV contrast if GFR low   Plan -will reach out to GI Dr. [name] Plan for colonoscopy in near future -will copy his urologist  -f/u in 2 months with lab, flush, pelvic MRI w wo contrast before       No problem-specific Assessment & Plan notes found for this encounter.   No orders of the defined types were placed in this encounter.  I discussed the assessment and treatment plan with the patient. The patient was provided an opportunity to ask questions and all were answered.  The patient agreed with the plan and demonstrated an understanding of the instructions.  The patient was advised to call back or seek an in-person evaluation if the symptoms worsen or if the condition fails to improve as anticipated.  The total time spent in the appointment was 30 minutes.    [name], MD 10/05/2019   I, Joslyn Devon, am acting as scribe for [name], MD.   I have reviewed the above documentation for accuracy and completeness, and I agree with the above.

## 2019-10-05 ENCOUNTER — Encounter: Payer: Self-pay | Admitting: Hematology

## 2019-10-05 ENCOUNTER — Inpatient Hospital Stay (HOSPITAL_BASED_OUTPATIENT_CLINIC_OR_DEPARTMENT_OTHER): Payer: Commercial Managed Care - PPO | Admitting: Hematology

## 2019-10-05 DIAGNOSIS — C186 Malignant neoplasm of descending colon: Secondary | ICD-10-CM | POA: Diagnosis not present

## 2019-10-05 DIAGNOSIS — R1907 Generalized intra-abdominal and pelvic swelling, mass and lump: Secondary | ICD-10-CM

## 2019-10-08 ENCOUNTER — Telehealth: Payer: Self-pay

## 2019-10-08 NOTE — Telephone Encounter (Signed)
-----   Message from [name], MD sent at 10/05/2019 12:00 PM EDT ----- Hi, I agree with colonoscopy and pelvic MRI as next steps. We will contact him to schedule colonoscopy. Consider further evaluation with Urology and CT guided biopsy if colonoscopy and MRI are not diagnostic. Thanks, Norberto Sorenson  ----- Message ----- From: [name], MD Sent: 10/05/2019  10:35 AM EDT To: [name], MD, #  Drs. [name] Plan and Winter,  I have copied my office note to you today. Please review his recent CT scan when you get a chance.  Due to the a rising tumor marker CA, I repeated his CT scan, which showed a soft tissue between prostate and rectum, no other evidence of cancer recurrence.  I told pt to get repeated colonoscopy in the next month, to rule out local recurrence or new colorectal cancer.  He is asymptomatic.   I plan to get a pelvic MRI with and without contrast in 2 months for follow-up, unless you have any other suggestions.   Thanks   [name]

## 2019-10-08 NOTE — Telephone Encounter (Signed)
[facility] Pre-operative Risk Assessment     Request for surgical clearance:     Endoscopy Procedure  What type of surgery is being performed?     colonoscopy  When is this surgery scheduled?     TBD  What type of clearance is required ?   Pharmacy  Are there any medications that need to be held prior to surgery and how long? Eliquis  Practice name and name of physician performing surgery?      [facility]  What is your office phone and fax number?      Phone- [phone]  [phone]  Anesthesia type (None, local, MAC, general) ?       MAC

## 2019-10-08 NOTE — Telephone Encounter (Signed)
Dr. [name], please advise if patient may come off his Eliquis for a colonoscopy and for how many days?  Thank you

## 2019-10-08 NOTE — Telephone Encounter (Signed)
Patient is on Eliquis.  He was able to hold for 2 days in 2019 for his last colonoscopy. Okay to proceed with colonoscopy direct off Eliquis, or does he need an office visit?

## 2019-10-08 NOTE — Telephone Encounter (Signed)
This patient does not appear to be a heartcare patient

## 2019-10-09 NOTE — Telephone Encounter (Signed)
OK for 2 day hold of Eliquis for colonoscopy

## 2019-10-09 NOTE — Telephone Encounter (Signed)
Patient notified okay to schedule a direct.  He is not available to schedule today, he needs to check his calendar.  He will call me back Wed or Thursday to sch.

## 2019-10-12 NOTE — Telephone Encounter (Signed)
Letter faxed to Dr. [name]

## 2019-10-12 NOTE — Telephone Encounter (Signed)
Patient has been scheduled for 11/02/19 and pre-visit on 5/20

## 2019-10-18 NOTE — Telephone Encounter (Signed)
Dr. [name] approved 2 day hold of Eliquis prior to the scheduled procedure.  Letter signed on 10/12/19 by Dr. [name] sent to scanning.

## 2019-10-22 ENCOUNTER — Other Ambulatory Visit: Payer: Self-pay

## 2019-10-22 ENCOUNTER — Ambulatory Visit (AMBULATORY_SURGERY_CENTER): Payer: Self-pay

## 2019-10-22 VITALS — Temp 97.7°F | Ht 72.0 in | Wt 224.0 lb

## 2019-10-22 DIAGNOSIS — R935 Abnormal findings on diagnostic imaging of other abdominal regions, including retroperitoneum: Secondary | ICD-10-CM

## 2019-10-22 DIAGNOSIS — C186 Malignant neoplasm of descending colon: Secondary | ICD-10-CM

## 2019-10-22 MED ORDER — SUTAB 1479-225-188 MG PO TABS: 12.0000 | ORAL_TABLET | ORAL | 0 refills | Status: DC

## 2019-10-22 NOTE — Progress Notes (Signed)
No egg or soy allergy known to patient  No issues with past sedation with any surgeries  or procedures, no intubation problems  No diet pills per patient No home 02 use per patient   Pt on eliquis blood thinners per patient  Per TE on 10/18/19 Dr [name], stop eliquis for 2 days, last dose on 10/30/19  Pt denies issues with constipation  No A fib or A flutter  EMMI video sent to pt's e mail   Due to the COVID-19 pandemic we are asking patients to follow these guidelines. Please only bring one care partner. Please be aware that your care partner may wait in the car in the parking lot or if they feel like they will be too hot to wait in the car, they may wait in the lobby on the 4th floor. All care partners are required to wear a mask the entire time (we do not have any that we can provide them), they need to practice social distancing, and we will do a Covid check for all patient's and care partners when you arrive. Also we will check their temperature and your temperature. If the care partner waits in their car they need to stay in the parking lot the entire time and we will call them on their cell phone when the patient is ready for discharge so they can bring the car to the front of the building. Also all patient's will need to wear a mask into building.

## 2019-11-02 ENCOUNTER — Other Ambulatory Visit: Payer: Self-pay

## 2019-11-02 ENCOUNTER — Ambulatory Visit (AMBULATORY_SURGERY_CENTER): Payer: Commercial Managed Care - PPO | Admitting: Gastroenterology

## 2019-11-02 ENCOUNTER — Encounter: Payer: Self-pay | Admitting: Gastroenterology

## 2019-11-02 VITALS — BP 114/78 | HR 69 | Temp 96.8°F | Resp 17 | Ht 72.0 in | Wt 224.0 lb

## 2019-11-02 DIAGNOSIS — K640 First degree hemorrhoids: Secondary | ICD-10-CM | POA: Diagnosis not present

## 2019-11-02 DIAGNOSIS — D120 Benign neoplasm of cecum: Secondary | ICD-10-CM

## 2019-11-02 DIAGNOSIS — R933 Abnormal findings on diagnostic imaging of other parts of digestive tract: Secondary | ICD-10-CM | POA: Diagnosis not present

## 2019-11-02 DIAGNOSIS — Z85038 Personal history of other malignant neoplasm of large intestine: Secondary | ICD-10-CM

## 2019-11-02 DIAGNOSIS — D128 Benign neoplasm of rectum: Secondary | ICD-10-CM | POA: Diagnosis not present

## 2019-11-02 DIAGNOSIS — D124 Benign neoplasm of descending colon: Secondary | ICD-10-CM

## 2019-11-02 MED ORDER — SODIUM CHLORIDE 0.9 % IV SOLN: 500.0000 mL | INTRAVENOUS | Status: DC

## 2019-11-02 NOTE — Progress Notes (Signed)
Vs CW I have reviewed the patient's medical history in detail and updated the computerized patient record.   

## 2019-11-02 NOTE — Op Note (Addendum)
[place] Patient Name: Dave Vaughn Procedure Date: 11/02/2019 9:23 AM MRN: [id number] Endoscopist: [name] , MD Age: 74 Referring MD:  Date of Birth: Oct 09, 1945 Gender: Male Account #: [id number] Procedure:                Colonoscopy Indications:              Abnormal CT of the GI tract. Personal history of                            colon cancer. Medicines:                Monitored Anesthesia Care Procedure:                Pre-Anesthesia Assessment:                           - Prior to the procedure, a History and Physical                            was performed, and patient medications and                            allergies were reviewed. The patient's tolerance of                            previous anesthesia was also reviewed. The risks                            and benefits of the procedure and the sedation                            options and risks were discussed with the patient.                            All questions were answered, and informed consent                            was obtained. Prior Anticoagulants: The patient has                            taken Eliquis (apixaban), last dose was 2 days                            prior to procedure. ASA Grade Assessment: III - A                            patient with severe systemic disease. After                            reviewing the risks and benefits, the patient was                            deemed in satisfactory condition to undergo the  procedure.                           After obtaining informed consent, the colonoscope                            was passed under direct vision. Throughout the                            procedure, the patient's blood pressure, pulse, and                            oxygen saturations were monitored continuously. The                            Colonoscope was introduced through the anus and                             advanced to the the cecum, identified by                            appendiceal orifice and ileocecal valve. The                            ileocecal valve, appendiceal orifice, and rectum                            were photographed. The quality of the bowel                            preparation was good. The colonoscopy was performed                            without difficulty. The patient tolerated the                            procedure well. Scope In: 9:31:28 AM Scope Out: 9:42:20 AM Scope Withdrawal Time: 0 hours 8 minutes 5 seconds  Total Procedure Duration: 0 hours 10 minutes 52 seconds  Findings:                 The perianal and digital rectal examinations were                            normal.                           Three sessile polyps were found in the rectum,                            descending colon and cecum. The polyps were 6 to 7                            mm in size. These polyps were removed with a cold  snare. Resection and retrieval were complete.                           There was evidence of a prior end-to-end                            colo-colonic anastomosis in the sigmoid colon at 25                            cm. This was patent and was characterized by                            erythema. The anastomosis was traversed.                           An extrinsic non-obstructing medium-sized mass,                            possibly submucosal mass, was found in the proximal                            rectum. The mass was partially circumferential                            (involving one-third of the lumen circumference)                            about 4 cm in length.                           Internal hemorrhoids were found during                            retroflexion. The hemorrhoids were small and Grade                            I (internal hemorrhoids that do not prolapse).                           The exam was  otherwise without abnormality on                            direct and retroflexion views. Complications:            No immediate complications. Estimated blood loss:                            None. Estimated Blood Loss:     Estimated blood loss: none. Impression:               - Three 6 to 7 mm polyps in the rectum, in the                            descending colon and in the cecum, removed with a  cold snare. Resected and retrieved.                           - Patent end-to-end colo-colonic anastomosis,                            characterized by erythema.                           - Extrinsic mass, possibly submucosal, in the                            proximal rectum.                           - Internal hemorrhoids.                           - The examination was otherwise normal on direct                            and retroflexion views. Recommendation:           - Repeat colonoscopy, likely in 3 years, after                            studies are complete for surveillance based on                            pathology results.                           - Patient has a contact number available for                            emergencies. The signs and symptoms of potential                            delayed complications were discussed with the                            patient. Return to normal activities tomorrow.                            Written discharge instructions were provided to the                            patient.                           - Resume previous diet.                           - Continue present medications.                           - Follow up with Dr. Burr Medico for extrinsic rectal  lesion and ongoing care.                           - Await pathology results.                           - Resume Eliquis (apixaban) at prior dose in 3                            days. Refer to managing physician for further                             adjustment of therapy.                           - No aspirin, ibuprofen, naproxen, or other                            non-steroidal anti-inflammatory drugs for 2 weeks                            after polyp removal. [name], MD 11/02/2019 9:56:12 AM This report has been signed electronically.

## 2019-11-02 NOTE — Progress Notes (Signed)
pt tolerated well. VSS. awake and to recovery. Report given to RN.  

## 2019-11-02 NOTE — Patient Instructions (Signed)
Handouts on polyps and hemorrhoids given to you today  Await pathology results  Resume Eliquis as normal in 3 days  NO ASPIRIN, ASPIRIN CONTAINING PRODUCTS (BC OR GOODY POWDERS) OR NSAIDS (IBUPROFEN, ADVIL, ALEVE, AND MOTRIN) FOR 2 weeks; TYLENOL IS OK TO TAKE   YOU HAD AN ENDOSCOPIC PROCEDURE TODAY AT THE [facility]:   Refer to the procedure report that was given to you for any specific questions about what was found during the examination.  If the procedure report does not answer your questions, please call your gastroenterologist to clarify.  If you requested that your care partner not be given the details of your procedure findings, then the procedure report has been included in a sealed envelope for you to review at your convenience later.  YOU SHOULD EXPECT: Some feelings of bloating in the abdomen. Passage of more gas than usual.  Walking can help get rid of the air that was put into your GI tract during the procedure and reduce the bloating. If you had a lower endoscopy (such as a colonoscopy or flexible sigmoidoscopy) you may notice spotting of blood in your stool or on the toilet paper. If you underwent a bowel prep for your procedure, you may not have a normal bowel movement for a few days.  Please Note:  You might notice some irritation and congestion in your nose or some drainage.  This is from the oxygen used during your procedure.  There is no need for concern and it should clear up in a day or so.  SYMPTOMS TO REPORT IMMEDIATELY:   Following lower endoscopy (colonoscopy or flexible sigmoidoscopy):  Excessive amounts of blood in the stool  Significant tenderness or worsening of abdominal pains  Swelling of the abdomen that is new, acute  Fever of 100F or higher  For urgent or emergent issues, a gastroenterologist can be reached at any hour by calling [phone]. Do not use MyChart messaging for urgent concerns.    DIET:  We do recommend a small meal at  first, but then you may proceed to your regular diet.  Drink plenty of fluids but you should avoid alcoholic beverages for 24 hours.  ACTIVITY:  You should plan to take it easy for the rest of today and you should NOT DRIVE or use heavy machinery until tomorrow (because of the sedation medicines used during the test).    FOLLOW UP: Our staff will call the number listed on your records 48-72 hours following your procedure to check on you and address any questions or concerns that you may have regarding the information given to you following your procedure. If we do not reach you, we will leave a message.  We will attempt to reach you two times.  During this call, we will ask if you have developed any symptoms of COVID 19. If you develop any symptoms (ie: fever, flu-like symptoms, shortness of breath, cough etc.) before then, please call [phone].  If you test positive for Covid 19 in the 2 weeks post procedure, please call and report this information to [place].    If any biopsies were taken you will be contacted by phone or by letter within the next 1-3 weeks.  Please call us at [phone] if you have not heard about the biopsies in 3 weeks.    SIGNATURES/CONFIDENTIALITY: You and/or your care partner have signed paperwork which will be entered into your electronic medical record.  These signatures attest to the fact that that the  information above on your After Visit Summary has been reviewed and is understood.  Full responsibility of the confidentiality of this discharge information lies with you and/or your care-partner.

## 2019-11-06 ENCOUNTER — Telehealth: Payer: Self-pay | Admitting: *Deleted

## 2019-11-06 NOTE — Telephone Encounter (Signed)
1. Have you developed a fever since your procedure? no  2.   Have you had an respiratory symptoms (SOB or cough) since your procedure? no  3.   Have you tested positive for COVID 19 since your procedure no  4.   Have you had any family members/close contacts diagnosed with the COVID 19 since your procedure?  no   If yes to any of these questions please route to Joylene John, RN and Erenest Rasher, RN Follow up Call-  Call back number 11/02/2019 05/10/2018  Post procedure Call Back phone  # [phone]  Permission to leave phone message Yes Yes  Some recent data might be hidden     Patient questions:  Do you have a fever, pain , or abdominal swelling? No. Pain Score  0 *  Have you tolerated food without any problems? Yes.    Have you been able to return to your normal activities? Yes.    Do you have any questions about your discharge instructions: Diet   No. Medications  No. Follow up visit  No.  Do you have questions or concerns about your Care? No.  Actions: * If pain score is 4 or above: No action needed, pain <4.

## 2019-11-07 ENCOUNTER — Telehealth: Payer: Self-pay | Admitting: Hematology

## 2019-11-07 NOTE — Telephone Encounter (Signed)
Called pt per 6/2 sch message to reschedule appt. Unable to reach pt - left message for patient to call back

## 2019-11-09 ENCOUNTER — Ambulatory Visit (HOSPITAL_COMMUNITY): Payer: Commercial Managed Care - PPO

## 2019-11-09 ENCOUNTER — Inpatient Hospital Stay: Payer: Commercial Managed Care - PPO

## 2019-11-14 ENCOUNTER — Ambulatory Visit: Payer: Commercial Managed Care - PPO | Admitting: Hematology

## 2019-11-16 ENCOUNTER — Other Ambulatory Visit: Payer: Self-pay

## 2019-11-19 ENCOUNTER — Ambulatory Visit (INDEPENDENT_AMBULATORY_CARE_PROVIDER_SITE_OTHER): Payer: Commercial Managed Care - PPO | Admitting: Family Medicine

## 2019-11-19 ENCOUNTER — Other Ambulatory Visit: Payer: Self-pay

## 2019-11-19 ENCOUNTER — Encounter: Payer: Self-pay | Admitting: Family Medicine

## 2019-11-19 VITALS — BP 104/72 | Temp 98.0°F | Ht 70.0 in | Wt 225.0 lb

## 2019-11-19 DIAGNOSIS — Z0000 Encounter for general adult medical examination without abnormal findings: Secondary | ICD-10-CM

## 2019-11-19 DIAGNOSIS — C186 Malignant neoplasm of descending colon: Secondary | ICD-10-CM | POA: Diagnosis not present

## 2019-11-19 DIAGNOSIS — N1831 Chronic kidney disease, stage 3a: Secondary | ICD-10-CM

## 2019-11-19 DIAGNOSIS — Z13220 Encounter for screening for lipoid disorders: Secondary | ICD-10-CM

## 2019-11-19 DIAGNOSIS — I482 Chronic atrial fibrillation, unspecified: Secondary | ICD-10-CM | POA: Diagnosis not present

## 2019-11-19 DIAGNOSIS — R739 Hyperglycemia, unspecified: Secondary | ICD-10-CM

## 2019-11-19 DIAGNOSIS — L578 Other skin changes due to chronic exposure to nonionizing radiation: Secondary | ICD-10-CM

## 2019-11-19 NOTE — Progress Notes (Signed)
Dave Vaughn DOB: April 18, 1946 Encounter date: 11/19/2019  This is a 74 y.o. male who presents for complete physical   History of present illness/Additional concerns: Last visit with me was in fall 2019.  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  He is following regularly with GI and oncology.rescoped last week - 3 polyps that were removed; repeat in 2 years.   Atrial fibrillation: On Eliquis, following with cardiology. Patient never had sx with this. Heart rate staying in the 80's. Slightly faster if exercising.   Chronic kidney disease stage III: Creatinine increased since starting chemo  Feels great. Does weight training, rides horses, stationary bike, walking.   Doing keto diet. Only diet that works for him. Has lost 16lbs in last 2-3 months. Goal weight is 190.   Doesn't want pneumonia vaccine.  Not following with dermatology.   Past Medical History:  Diagnosis Date  . Adenocarcinoma, colon ([place]) dx'd 01/2018  . Anemia    taking iron supplements  . Anxiety   . Atrial fibrillation with RVR ([place])   . Colonic obstruction ([place]) 01/10/2018  . Depression   . Diverticulitis   . Dysrhythmia    afib  . History of kidney stones    Past Surgical History:  Procedure Laterality Date  . ANKLE SURGERY Left    when he was in college  . COLON RESECTION N/A 01/11/2018   Procedure: LEFT COLON RESECTION, TAKEDOWN SPLENIC FLEXURE, COLOSTOMY;  Surgeon: [name], MD;  Location: [facility];  Service: General;  Laterality: N/A;  . COLONOSCOPY  01/11/2018   Procedure: COLONOSCOPY;  Surgeon: Jackquline Denmark, MD;  Location: [facility];  Service: Endoscopy;;  . COLONOSCOPY  05/10/2018  . colonscopy  05/10/2018  . HERNIA REPAIR    . HERNIA REPAIR  03/02/2019   EXPLORATORY LAPAROTOMY (N/A Abdomen)  . INCISIONAL HERNIA REPAIR N/A 03/02/2019   Procedure: INCISIONAL HERNIA REPAIR , RECTORECTUS VS TAR  HERNIA REPAIR;  Surgeon: Ralene Ok, MD;  Location: [facility];  Service: General;  Laterality: N/A;  . INSERTION OF MESH N/A 03/02/2019   Procedure: Insertion Of Mesh;  Surgeon: Ralene Ok, MD;  Location: [facility];  Service: General;  Laterality: N/A;  . LAPAROTOMY N/A 03/02/2019   Procedure: EXPLORATORY LAPAROTOMY;  Surgeon: Ralene Ok, MD;  Location: [facility];  Service: General;  Laterality: N/A;  . LYSIS OF ADHESION N/A 06/12/2018   Procedure: LYSIS OF ADHESIONS;  Surgeon: [name], MD;  Location: [facility];  Service: General;  Laterality: N/A;  . LYSIS OF ADHESION N/A 03/02/2019   Procedure: Lysis Of Adhesion;  Surgeon: Ralene Ok, MD;  Location: [facility];  Service: General;  Laterality: N/A;  . PORTACATH PLACEMENT Right 03/07/2018   Procedure: INSERTION PORT-A-CATH RIGHT SUBCLAVIAN;  Surgeon: [name], MD;  Location: [place] [facility];  Service: General;  Laterality: Right;  . PROCTOSCOPY N/A 06/12/2018   Procedure: RIGID PROCTOSCOPY;  Surgeon: [name], MD;  Location: [facility];  Service: General;  Laterality: N/A;  . thumb surgery   2018   cyst removal   No Known Allergies Current Meds  Medication Sig  . apixaban (ELIQUIS) 5 MG TABS tablet Take 1 tablet (5 mg total) by mouth 2 (two) times daily.  [name] atorvastatin (LIPITOR) 40 MG tablet Take 40 mg by mouth daily.  [name] b complex vitamins tablet Take 1 tablet by mouth daily.  [name] diltiazem (CARDIZEM CD) 240 MG 24 hr capsule Take 240  mg by mouth daily.  [name] docusate sodium (COLACE) 100 MG capsule Take 100 mg by mouth 2 (two) times daily.  . isosorbide mononitrate (IMDUR) 30 MG 24 hr tablet Take 30 mg by mouth daily.  [name] levothyroxine (SYNTHROID) 125 MCG tablet Take 125 mcg by mouth daily.  . Multiple Vitamins-Iron (MULTIVITAMIN/IRON PO) Take 1 tablet by mouth daily.   . tamsulosin (FLOMAX) 0.4 MG CAPS capsule Take 0.4 mg by mouth 2 (two) times daily.    Social History   Tobacco Use  . Smoking status: Never Smoker  . Smokeless tobacco:  Former Systems developer    Types: Chew  Substance Use Topics  . Alcohol use: Yes    Alcohol/week: 2.0 standard drinks    Types: 1 Cans of beer, 1 Shots of liquor per week   Family History  Problem Relation Age of Onset  . Breast cancer Mother 103       metastatin; recurrence x7  . Heart attack Father 70  . Cancer Daughter        melanoma, leukemia  . Esophageal cancer Neg Hx   . Colon cancer Neg Hx   . Rectal cancer Neg Hx   . Ulcerative colitis Neg Hx      Review of Systems  Constitutional: Negative for activity change, appetite change, chills, fatigue, fever and unexpected weight change.  HENT: Negative for congestion, ear pain, hearing loss, sinus pressure, sinus pain, sore throat and trouble swallowing.   Eyes: Negative for pain and visual disturbance.  Respiratory: Negative for cough, chest tightness, shortness of breath and wheezing.   Cardiovascular: Negative for chest pain, palpitations and leg swelling.  Gastrointestinal: Negative for abdominal distention, abdominal pain, blood in stool, constipation, diarrhea, nausea and vomiting.  Genitourinary: Negative for decreased urine volume, difficulty urinating, dysuria, penile pain and testicular pain.  Musculoskeletal: Negative for arthralgias, back pain and joint swelling.  Skin: Negative for rash.  Neurological: Negative for dizziness, weakness, numbness and headaches.  Hematological: Negative for adenopathy. Does not bruise/bleed easily.  Psychiatric/Behavioral: Negative for agitation, sleep disturbance (does snore; does not want sleep study) and suicidal ideas. The patient is not nervous/anxious.     CBC:  Lab Results  Component Value Date   WBC 4.8 09/10/2019   WBC 10.5 03/03/2019   HGB 13.6 09/10/2019   HCT 41.6 09/10/2019   MCH 30.7 09/10/2019   MCHC 32.7 09/10/2019   RDW 12.7 09/10/2019   PLT 186 09/10/2019   CMP: Lab Results  Component Value Date   NA 142 09/10/2019   K 4.1 09/10/2019   CL 105 09/10/2019   CO2 28  09/10/2019   ANIONGAP 9 09/10/2019   GLUCOSE 106 (H) 09/10/2019   BUN 24 (H) 09/10/2019   CREATININE 1.50 (H) 09/10/2019   GFRAA 53 (L) 09/10/2019   CALCIUM 9.2 09/10/2019   PROT 6.9 09/10/2019   BILITOT 0.6 09/10/2019   ALKPHOS 85 09/10/2019   ALT 14 09/10/2019   AST 15 09/10/2019   LIPID:No results found for: CHOL, TRIG, HDL, LDLCALC, LABVLDL  Objective:  BP 104/72   Temp 98 F (36.7 C) (Temporal)   Ht 5\' 10"  (1.778 m) Comment: WITH SHOES  Wt 225 lb (102.1 kg)   BMI 32.28 kg/m   Weight: 225 lb (102.1 kg)   BP Readings from Last 3 Encounters:  11/19/19 104/72  11/02/19 114/78  07/16/19 123/85   Wt Readings from Last 3 Encounters:  11/19/19 225 lb (102.1 kg)  11/02/19 224 lb (101.6 kg)  10/22/19 224  lb (101.6 kg)    Physical Exam Constitutional:      General: He is not in acute distress.    Appearance: He is well-developed.  HENT:     Head: Normocephalic and atraumatic.     Right Ear: External ear normal.     Left Ear: External ear normal.     Nose: Nose normal.     Mouth/Throat:     Pharynx: No oropharyngeal exudate.  Eyes:     Conjunctiva/sclera: Conjunctivae normal.     Pupils: Pupils are equal, round, and reactive to light.  Neck:     Thyroid: No thyromegaly.  Cardiovascular:     Rate and Rhythm: Normal rate. Rhythm irregularly irregular.     Heart sounds: Normal heart sounds. No murmur heard.  No friction rub. No gallop.   Pulmonary:     Effort: Pulmonary effort is normal. No respiratory distress.     Breath sounds: Normal breath sounds. No stridor. No wheezing or rales.  Abdominal:     General: Bowel sounds are normal.     Palpations: Abdomen is soft.  Musculoskeletal:        General: Normal range of motion.     Cervical back: Neck supple.  Skin:    General: Skin is warm and dry.     Comments: Sun damaged skin face, arms. Actinic keratosis ears, face. multipel seborrheic keratosis back, chest.   Neurological:     Mental Status: He is alert  and oriented to person, place, and time.  Psychiatric:        Behavior: Behavior normal.        Thought Content: Thought content normal.        Judgment: Judgment normal.     Assessment/Plan: Health Maintenance Due  Topic Date Due  . Hepatitis C Screening  Never done  . TETANUS/TDAP  Never done  . PNA vac Low Risk Adult (1 of 2 - PCV13) Never done   Health Maintenance reviewed - declines further immunizations today.  1. Preventative health care - Hepatitis C antibody; Future  2. Chronic atrial fibrillation Rate controlled. - Ambulatory referral to Cardiology - CBC with Differential/Platelet; Future  3. Cancer of left colon [facility]) Following with oncology and GI.   4. Stage 3a chronic kidney disease Encouraged good hydration. Will recheck at next oncology visit.  5. Hyperglycemia - Comprehensive metabolic panel; Future - Hemoglobin A1c; Future  6. Lipid screening - Lipid panel; Future  7. Sun-damaged skin We discussed benefit of regular dermatology visits to monitor sun damage/skin cancer. - Ambulatory referral to Dermatology  Return in about 6 months (around 05/20/2020) for Chronic condition visit.  Micheline Rough, MD

## 2019-11-19 NOTE — Patient Instructions (Signed)
Consider shingrix, Tdap at pharmacy  (consider pneumonia shot as well)

## 2019-11-26 ENCOUNTER — Encounter: Payer: Self-pay | Admitting: Gastroenterology

## 2019-11-26 ENCOUNTER — Inpatient Hospital Stay: Payer: Commercial Managed Care - PPO

## 2019-11-26 ENCOUNTER — Inpatient Hospital Stay: Payer: Commercial Managed Care - PPO | Attending: Hematology

## 2019-11-26 ENCOUNTER — Other Ambulatory Visit: Payer: Self-pay

## 2019-11-26 DIAGNOSIS — Z452 Encounter for adjustment and management of vascular access device: Secondary | ICD-10-CM | POA: Diagnosis not present

## 2019-11-26 DIAGNOSIS — C186 Malignant neoplasm of descending colon: Secondary | ICD-10-CM

## 2019-11-26 DIAGNOSIS — Z95828 Presence of other vascular implants and grafts: Secondary | ICD-10-CM

## 2019-11-26 DIAGNOSIS — Z85038 Personal history of other malignant neoplasm of large intestine: Secondary | ICD-10-CM | POA: Insufficient documentation

## 2019-11-26 LAB — CBC WITH DIFFERENTIAL (CANCER CENTER ONLY)
Abs Immature Granulocytes: 0.02 10*3/uL (ref 0.00–0.07)
Basophils Absolute: 0 10*3/uL (ref 0.0–0.1)
Basophils Relative: 1 %
Eosinophils Absolute: 0.1 10*3/uL (ref 0.0–0.5)
Eosinophils Relative: 2 %
HCT: 42.6 % (ref 39.0–52.0)
Hemoglobin: 14.1 g/dL (ref 13.0–17.0)
Immature Granulocytes: 0 %
Lymphocytes Relative: 31 %
Lymphs Abs: 2 10*3/uL (ref 0.7–4.0)
MCH: 30.1 pg (ref 26.0–34.0)
MCHC: 33.1 g/dL (ref 30.0–36.0)
MCV: 90.8 fL (ref 80.0–100.0)
Monocytes Absolute: 0.5 10*3/uL (ref 0.1–1.0)
Monocytes Relative: 7 %
Neutro Abs: 3.7 10*3/uL (ref 1.7–7.7)
Neutrophils Relative %: 59 %
Platelet Count: 188 10*3/uL (ref 150–400)
RBC: 4.69 MIL/uL (ref 4.22–5.81)
RDW: 13.2 % (ref 11.5–15.5)
WBC Count: 6.4 10*3/uL (ref 4.0–10.5)
nRBC: 0 % (ref 0.0–0.2)

## 2019-11-26 LAB — CMP (CANCER CENTER ONLY)
ALT: 15 U/L (ref 0–44)
AST: 20 U/L (ref 15–41)
Albumin: 4.1 g/dL (ref 3.5–5.0)
Alkaline Phosphatase: 74 U/L (ref 38–126)
Anion gap: 10 (ref 5–15)
BUN: 26 mg/dL — ABNORMAL HIGH (ref 8–23)
CO2: 26 mmol/L (ref 22–32)
Calcium: 9.3 mg/dL (ref 8.9–10.3)
Chloride: 107 mmol/L (ref 98–111)
Creatinine: 1.5 mg/dL — ABNORMAL HIGH (ref 0.61–1.24)
GFR, Est AFR Am: 53 mL/min — ABNORMAL LOW (ref >60)
GFR, Estimated: 46 mL/min — ABNORMAL LOW (ref >60)
Glucose, Bld: 100 mg/dL — ABNORMAL HIGH (ref 70–99)
Potassium: 4.4 mmol/L (ref 3.5–5.1)
Sodium: 143 mmol/L (ref 135–145)
Total Bilirubin: 0.8 mg/dL (ref 0.3–1.2)
Total Protein: 6.9 g/dL (ref 6.5–8.1)

## 2019-11-26 LAB — CEA (IN HOUSE-CHCC): CEA (CHCC-In House): 16.31 ng/mL — ABNORMAL HIGH (ref 0.00–5.00)

## 2019-11-26 MED ORDER — HEPARIN SOD (PORK) LOCK FLUSH 100 UNIT/ML IV SOLN
500.0000 [IU] | Freq: Once | INTRAVENOUS | Status: AC | PRN
  Administered 2019-11-26: 500 [IU]
  Filled 2019-11-26: qty 5

## 2019-11-26 MED ORDER — SODIUM CHLORIDE 0.9% FLUSH
10.0000 mL | INTRAVENOUS | Status: DC | PRN
  Administered 2019-11-26: 10 mL
  Filled 2019-11-26: qty 10

## 2019-11-30 ENCOUNTER — Emergency Department (HOSPITAL_COMMUNITY)
Admission: EM | Admit: 2019-11-30 | Discharge: 2019-12-01 | Disposition: A | Discharge status: Home / Self Care | Payer: Commercial Managed Care - PPO | Attending: Emergency Medicine | Admitting: Emergency Medicine

## 2019-11-30 ENCOUNTER — Encounter (HOSPITAL_COMMUNITY): Payer: Self-pay

## 2019-11-30 ENCOUNTER — Other Ambulatory Visit: Payer: Self-pay

## 2019-11-30 ENCOUNTER — Telehealth: Payer: Self-pay | Admitting: Nurse Practitioner

## 2019-11-30 DIAGNOSIS — Z5321 Procedure and treatment not carried out due to patient leaving prior to being seen by health care provider: Secondary | ICD-10-CM | POA: Diagnosis not present

## 2019-11-30 DIAGNOSIS — K921 Melena: Secondary | ICD-10-CM | POA: Insufficient documentation

## 2019-11-30 NOTE — Telephone Encounter (Signed)
R/s appt for f/u to be after MRI. Left voicemail- appts moved to 7/7.

## 2019-11-30 NOTE — ED Triage Notes (Addendum)
Pt states 3 episodes of bright red stool. Pt sts colonoscopy 3 weeks ago with internal hemorrhage. Hx colon cancer.

## 2019-12-01 NOTE — ED Notes (Signed)
Attempted to call pt 3X for blood work. Eloped from waiting area.

## 2019-12-03 ENCOUNTER — Inpatient Hospital Stay: Payer: Commercial Managed Care - PPO | Admitting: Nurse Practitioner

## 2019-12-06 ENCOUNTER — Other Ambulatory Visit: Payer: Self-pay

## 2019-12-06 ENCOUNTER — Ambulatory Visit (HOSPITAL_COMMUNITY)
Admission: RE | Admit: 2019-12-06 | Discharge: 2019-12-06 | Disposition: A | Discharge status: Home / Self Care | Payer: Commercial Managed Care - PPO | Source: Ambulatory Visit | Attending: Hematology | Admitting: Hematology

## 2019-12-06 ENCOUNTER — Ambulatory Visit: Payer: Commercial Managed Care - PPO | Admitting: Nurse Practitioner

## 2019-12-06 DIAGNOSIS — R1907 Generalized intra-abdominal and pelvic swelling, mass and lump: Secondary | ICD-10-CM | POA: Insufficient documentation

## 2019-12-06 MED ORDER — GADOBUTROL 1 MMOL/ML IV SOLN
10.0000 mL | Freq: Once | INTRAVENOUS | Status: AC | PRN
  Administered 2019-12-06: 10 mL via INTRAVENOUS

## 2019-12-11 NOTE — Progress Notes (Addendum)
[place]   [phone] Fax:(336) [phone]   Clinic Follow up Note   Patient Care Team: [name], MD as PCP - General (Family Medicine) [name], MD as Consulting Physician (Cardiology) [name], MD as Consulting Physician (Gastroenterology) [name], MD as Consulting Physician (General Surgery) [name], MD as Consulting Physician (General Surgery) [name], MD as Consulting Physician (Urology) [name], MD as Consulting Physician (Medical Oncology) 12/12/2019  CHIEF COMPLAINT: F/u history of colon cancer, new rectal mass  SUMMARY OF ONCOLOGIC HISTORY: Oncology History Overview Note  Cancer Staging Cancer of left colon [facility]) Staging form: Colon and Rectum, AJCC 8th Edition - Pathologic stage from 01/11/2018: Stage IIIB (pT3, pN1c, cM0) - Signed by [name], MD on 01/16/2018     Cancer of left colon ([place])  01/10/2018 Imaging   CT AP W Contrast 01/10/18  IMPRESSION: Irregular soft tissue density causing stricture of the mid descending colon likely the site of obstruction for the dilated small bowel. 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 No evidence of perforation.  Equivocal findings involving the appendix measuring 1.2 cm at the appendiceal tip with mucosal enhancement. No adjacent free fluid or inflammatory change. Findings are nonspecific, but can be seen with early acute appendicitis.  Mild prostatic enlargement. Increased density over the posterior bladder base likely due to the large prostatic impression although cannot completely exclude a bladder mass. Urology protocol CT or ultrasound may be helpful for better evaluation.  Mild cholelithiasis.  Stable 1.5 cm cystic structure over the lower pole right kidney likely slightly hyperdense cyst.  Diverticulosis of the colon.  Aortic Atherosclerosis (ICD10-I70.0).     01/11/2018 Cancer Staging   Staging form: Colon and Rectum,  AJCC 8th Edition - Pathologic stage from 01/11/2018: Stage IIIB (pT3, pN1c, cM0) - Signed by [name], MD on 01/16/2018   01/11/2018 Surgery   LEFT COLON RESECTION, TAKEDOWN SPLENIC FLEXURE, COLOSTOMY by Dr. [name]    01/11/2018 Procedure   Colonoscopy 01/11/18 by Dr. [name]  - Malignant completely obstructing tumor in the mid descending colon. Tattooed. - Diverticulosis in the sigmoid colon. - Non-bleeding internal hemorrhoids. - No specimens collected.   01/11/2018 Pathology Results   Diagnosis 01/11/18  1. Colon, segmental resection for tumor, descending colon - INVASIVE COLORECTAL ADENOCARCINOMA, 4 CM. - TUMOR EXTENDS INTO PERICOLONIC CONNECTIVE TISSUE. - TUMOR FOCALLY INVOLVES RADIAL MARGIN. - ONE MESENTERIC TUMOR DEPOSIT. - THIRTEEN BENIGN LYMPH NODES (0/13). 2. Colon, segmental resection, splenic flexure - BENIGN COLON. - NO EVIDENCE OF MALIGNANCY .   01/11/2018 Tumor Marker   Baseline CEA at 3.4   01/16/2018 Initial Diagnosis   Cancer of left colon ([place])   01/23/2018 Imaging   CT CHEST WO CONTRAST IMPRESSION: 1. No evidence for metastatic disease within the chest. 2. Small left pleural effusion with underlying opacities which may represent atelectasis. Right basilar atelectasis. 3. Few foci of gas within the upper abdomen in the omentum with surrounding fat stranding, likely postsurgical 4. Aortic Atherosclerosis (ICD10-I70.0).   03/08/2018 - 05/15/2018 Chemotherapy   adjuvant FOLOFX. Due to side effects of neuropahty Oxaliplatin was stopped after 3 cycles and chemo was stopped after 6 cycles. He declined completing 6 months of chemo treatment.     03/19/2018 Imaging   03/19/2018 CT AP IMPRESSION: 1. Interval partial left hemicolectomy and descending colostomy. 2. Heterogeneous soft tissue density along the left anterior renal fascia is most likely postoperative (favor fat necrosis). No well-defined fluid collection. 3. Mild left lower quadrant edema,  new since 01/10/2018.  This could be postoperative. Superimposed sigmoid diverticulitis and/or cystitis cannot be excluded. 4. Subtle hyperenhancing nodule within the anterior bladder dome cannot be excluded. Consider nonemergent cystoscopy. When this is performed, recommend attention to the left ureterovesicular junction and distal left ureter to evaluate questionable soft tissue fullness. 5. Cholelithiasis. 6.  Aortic Atherosclerosis (ICD10-I70.0). 7. Prostatomegaly.   11/13/2018 Imaging   CT CAP WO Contrast 11/13/18  IMPRESSION: 1. Reversal of left lower quadrant colostomy with sigmoid colon anastomosis. No complicating features. No findings for residual or recurrent tumor or metastatic disease involving the chest, abdomen or pelvis without contrast. 2. No acute abdominal/pelvic findings. 3. Gallbladder sludge and gallstones but no findings for acute cholecystitis. 4. Stable anterior abdominal wall hernia. 5. The right testicle is in the right inguinal canal.   10/03/2019 Imaging   CT CAP WO contrast  IMPRESSION: 1. New rounded density interposed between the prostate gland and anterior upper rectal wall could represent adenopathy or local extension of anterior rectal tumor. 2. Marked prostatomegaly, prostate volume 150 cubic cm. 3. Other imaging findings of potential clinical significance: Aortic Atherosclerosis (ICD10-I70.0). Coronary atherosclerosis. Trace right pleural effusion. Suspected cholelithiasis. Nonobstructive left nephrolithiasis. Multilevel lumbar impingement. Bilateral mildly retracted testicles. Hypodense exophytic lesion of the right kidney, most likely to be a cyst.   12/07/2019 Imaging   MRI pelvis IMPRESSION: 1. Masslike area in the rectum suspicious for rectal neoplasm, likely T4b based on the appearance of soft tissue extending along the anterior peritoneal reflection and into the seminal vesicles. Correlation with recent colonoscopy results may be helpful. Area  of anastomosis and other areas of the pelvis are not imaged on today's exam.     CURRENT THERAPY: Surveillance  INTERVAL HISTORY: Mr. Depuy returns for follow-up as scheduled, here with his wife.  He underwent a pelvic MRI on 12/07/2019 which shows a masslike area in the rectum suspicious for rectal neoplasm, likely T4b based on the soft tissue appearance extending along the anterior peritoneal reflection and into seminal vesicles.  He denies rectal pain.  He had one episode of rectal bleeding on 12/01/2019 and went to the ED, left without being seen.  He has known hemorrhoids.  He stopped Eliquis for 1-2 days and bleeding resolved.  Bowels moving normally. He has normal energy and appetite.  No unintentional weight loss.  Denies new or worsening urinary symptoms.   MEDICAL HISTORY:  Past Medical History:  Diagnosis Date  . Adenocarcinoma, colon ([place]) dx'd 01/2018  . Anemia    taking iron supplements  . Anxiety   . Atrial fibrillation with RVR ([place])   . Colonic obstruction ([place]) 01/10/2018  . Depression   . Diverticulitis   . Dysrhythmia    afib  . History of kidney stones     SURGICAL HISTORY: Past Surgical History:  Procedure Laterality Date  . ANKLE SURGERY Left    when he was in college  . COLON RESECTION N/A 01/11/2018   Procedure: LEFT COLON RESECTION, TAKEDOWN SPLENIC FLEXURE, COLOSTOMY;  Surgeon: [name], MD;  Location: [facility];  Service: General;  Laterality: N/A;  . COLONOSCOPY  01/11/2018   Procedure: COLONOSCOPY;  Surgeon: Jackquline Denmark, MD;  Location: [facility];  Service: Endoscopy;;  . COLONOSCOPY  05/10/2018  . colonscopy  05/10/2018  . HERNIA REPAIR    . HERNIA REPAIR  03/02/2019   EXPLORATORY LAPAROTOMY (N/A Abdomen)  . INCISIONAL HERNIA REPAIR N/A 03/02/2019   Procedure: INCISIONAL HERNIA REPAIR , RECTORECTUS VS TAR HERNIA REPAIR;  Surgeon:  Ralene Ok, MD;  Location: [place];  Service: General;  Laterality: N/A;  . INSERTION OF MESH N/A 03/02/2019    Procedure: Insertion Of Mesh;  Surgeon: Ralene Ok, MD;  Location: [place];  Service: General;  Laterality: N/A;  . LAPAROTOMY N/A 03/02/2019   Procedure: EXPLORATORY LAPAROTOMY;  Surgeon: Ralene Ok, MD;  Location: [place];  Service: General;  Laterality: N/A;  . LYSIS OF ADHESION N/A 06/12/2018   Procedure: LYSIS OF ADHESIONS;  Surgeon: [name], MD;  Location: [facility];  Service: General;  Laterality: N/A;  . LYSIS OF ADHESION N/A 03/02/2019   Procedure: Lysis Of Adhesion;  Surgeon: Ralene Ok, MD;  Location: [place];  Service: General;  Laterality: N/A;  . PORTACATH PLACEMENT Right 03/07/2018   Procedure: INSERTION PORT-A-CATH RIGHT SUBCLAVIAN;  Surgeon: [name], MD;  Location: [place];  Service: General;  Laterality: Right;  . PROCTOSCOPY N/A 06/12/2018   Procedure: RIGID PROCTOSCOPY;  Surgeon: [name], MD;  Location: [facility];  Service: General;  Laterality: N/A;  . thumb surgery   2018   cyst removal    I have reviewed the social history and family history with the patient and they are unchanged from previous note.  ALLERGIES:  has No Known Allergies.  MEDICATIONS:  Current Outpatient Medications  Medication Sig Dispense Refill  . apixaban (ELIQUIS) 5 MG TABS tablet Take 1 tablet (5 mg total) by mouth 2 (two) times daily. 60 tablet 0  . atorvastatin (LIPITOR) 40 MG tablet Take 40 mg by mouth daily.    [name] b complex vitamins tablet Take 1 tablet by mouth daily.    [name] diltiazem (CARDIZEM CD) 240 MG 24 hr capsule Take 240 mg by mouth daily.    [name] docusate sodium (COLACE) 100 MG capsule Take 100 mg by mouth 2 (two) times daily.    . isosorbide mononitrate (IMDUR) 30 MG 24 hr tablet Take 30 mg by mouth daily.    [name] levothyroxine (SYNTHROID) 125 MCG tablet Take 125 mcg by mouth daily.    . Multiple Vitamins-Iron (MULTIVITAMIN/IRON PO) Take 1 tablet by mouth daily.     . tamsulosin (FLOMAX) 0.4 MG CAPS capsule Take 0.4 mg by mouth 2 (two) times daily.      No current  facility-administered medications for this visit.    PHYSICAL EXAMINATION: ECOG PERFORMANCE STATUS: 0 - Asymptomatic  Vitals:   12/12/19 1155  BP: 123/88  Pulse: 89  Resp: 18  Temp: 98.4 F (36.9 C)  SpO2: 98%   Filed Weights   12/12/19 1155  Weight: 226 lb 11.2 oz (102.8 kg)    GENERAL:alert, no distress and comfortable SKIN: No rash to exposed skin EYES: sclera clear LUNGS: clear with normal breathing effort HEART: regular rate & rhythm, no lower extremity edema ABDOMEN: abdomen soft, non-tender and normal bowel sounds.  Changes from previous hernia repair and colorectal surgery noted to left lower abdomen NEURO: alert & oriented x 3 with fluent speech, no focal motor/sensory deficits Rectal exam deferred  LABORATORY DATA:  I have reviewed the data as listed CBC Latest Ref Rng & Units 11/26/2019 09/10/2019 08/13/2019  WBC 4.0 - 10.5 K/uL 6.4 4.8 5.4  Hemoglobin 13.0 - 17.0 g/dL 14.1 13.6 14.0  Hematocrit 39 - 52 % 42.6 41.6 43.1  Platelets 150 - 400 K/uL 188 186 176     CMP Latest Ref Rng & Units 11/26/2019 09/10/2019 08/13/2019  Glucose 70 - 99 mg/dL 100(H) 106(H) 99  BUN 8 - 23 mg/dL 26(H)  24(H) 21  Creatinine 0.61 - 1.24 mg/dL 1.50(H) 1.50(H) 1.43(H)  Sodium 135 - 145 mmol/L 143 142 143  Potassium 3.5 - 5.1 mmol/L 4.4 4.1 4.6  Chloride 98 - 111 mmol/L 107 105 104  CO2 22 - 32 mmol/L _0 Calcium 8.9 - 10.3 mg/dL 9.3 9.2 9.4  Total Protein 6.5 - 8.1 g/dL 6.9 6.9 6.9  Total Bilirubin 0.3 - 1.2 mg/dL 0.8 0.6 0.6  Alkaline Phos 38 - 126 U/L 74 85 79  AST 15 - 41 U/L _1 ALT 0 - 44 U/L _2 RADIOGRAPHIC STUDIES: I have personally reviewed the radiological images as listed and agreed with the findings in the report. No results found.   ASSESSMENT & PLAN: CARMAN ESSICK is a 74 y.o. male with    1. Cancer of left colon,adenocarcinoma, stage IIIB(pT3N1cM0), MSI-stable -Diagnosed in 01/2018. Treated with surgery andadjuvantFOLFOX. Due to  side effects Oxaliplatin was stopped after 3 cycles and chemo was stopped after 6 cycles. He declined completing 6 months of chemo treatment. -He underwent hernia repair in 02/2019. He recovered well.  -Surveillance CT CAP from 10/03/19 shows no definitive evidence of metastasis, except a soft tissue density in between prostate and rectum, indeterminate.  -colonoscopy in December 2019 showed mucosal inflammation in the rectum, biopsy was negative.  -Repeat colonoscopy on 11/02/2019 by Dr. [name] Plan showed normal digital rectal exam, 3 polyps in the rectum, descending colon and cecum, and a prior sigmoid: Anastomosis characterized by erythema.  He found an extrinsic nonobstructing medium-sized mass in the proximal rectum about 4 cm in length, no internal rectal mass. -He underwent pelvic MRI on 12/06/2019 which was reviewed in our tumor board this morning and again with the patient and his wife today, showing a masslike area in the rectum suspicious for rectal neoplasm, likely T4b based on the appearance of soft tissue extending along the anterior peritoneal reflection and into the seminal vesicles -Mr. Alemu is clinically doing, denies rectal pain or change in bowel habits. However given the rising CEA and MRI findings, this is concerning for malignancy. CBC and CMP otherwise stable. -Per GI tumor board discussion, we are referring him for a PET scan for further exploration, he will follow-up with Dr. Johney Maine after the PET scan for biopsy.  If unable to biopsy we may pursue EUS or IR biopsy at that point. -Follow-up open, after PET scan and follow-up with Dr. Johney Maine  2.  BPH  -Followed by urologist Dr. Lovena Neighbours -Pelvic MRI on 7/1 shows marked prostatomegaly with signs of BPH extending into the bladder base -Denies new or worsening urinary symptoms -Due to the proximity to the bladder, may discuss biopsy options with Dr. Gilford Rile if tissue sampling proves otherwise difficult   3. Atrial Fibrillation -continue  Eliquis. Rate controlled.  He can stop Eliquis periodically for bleeding hemorrhoids -Continue follow-up with cardiology  4.CKD stage III -he has developed mild increased Cr since he started chemo, EGFR around 40-50's -I encouraged him to control his blood pressure, cholesterol and BG. I also encouraged him to drink plenty of water. -Will avoid NSAIDs and CT IV contrast if GFR low  PLAN: -Labs, MRI, and tumor board discussion reviewed -PET scan in 1-2 weeks, will call patient with results  -F/u with Dr. Johney Maine and biopsy after PET -F/u open, will see him back after PET and biopsy  -CC note to urology, Dr. Lovena Neighbours  No problem-specific Assessment & Plan notes found  for this encounter.   Orders Placed This Encounter  Procedures  . NM PET Image Initial (PI) Skull Base To Thigh    Standing Status:   Future    Standing Expiration Date:   12/11/2020    Order Specific Question:   ** REASON FOR EXAM (FREE TEXT)    Answer:   h/o sigmoid colon cancer, new rectal mass with negative colonscopy    Order Specific Question:   If indicated for the ordered procedure, I authorize the administration of a radiopharmaceutical per Radiology protocol    Answer:   Yes    Order Specific Question:   Preferred imaging location?    Answer:   [facility]    Order Specific Question:   Radiology Contrast Protocol - do NOT remove file path    Answer:   [email or web address]   All questions were answered. The patient knows to call the clinic with any problems, questions or concerns. No barriers to learning were detected.     Alla Feeling, NP 12/12/19   Addendum  I have seen the patient, examined him. I agree with the assessment and and plan and have edited the notes.   I presented his case in our GI conference this morning, we reviewed his recent CT and MRI scans.  The soft tissue between the prostate gland and anterior rectal wall has increased in size, is highly concerning for  malignancy.  His tumor marker CEA has been trending up also which is concerning for his colon cancer recurrence.  His recent colonoscopy in May 2021 showed no rectal mass.  I recommend biopsy for tissue diagnosis.  Mention of radiologist Dr. Vernard Gambles views they can try percutaneous biopsy.  Patient's surgeon Dr. Johney Maine would like to see patient himself before biopsy, so I will hold on the biopsy order, and we sent a message to Dr. Johney Maine for his f/u appointment with him. Dr. Johney Maine recommend a PET scan evaluation, which we ordered today. I also reached out to his urologist Dr. Lovena Neighbours after his visit, he did not think it is related to his prostate percutaneous biopsy.  We will call patient after his PET scan, and further work up will defer to Dr. Johney Maine at this point.   Patient stated he would not want surgery if he has to have a colostomy. We discussed the option of radiation, chemo briefly if this is recurrent colon cancer, but I encouraged him to discuss surgery with Dr. Johney Maine if biopsy confirms cancer.   [name]  12/12/2019

## 2019-12-12 ENCOUNTER — Encounter: Payer: Self-pay | Admitting: Nurse Practitioner

## 2019-12-12 ENCOUNTER — Other Ambulatory Visit: Payer: Self-pay

## 2019-12-12 ENCOUNTER — Telehealth: Payer: Self-pay

## 2019-12-12 ENCOUNTER — Inpatient Hospital Stay: Payer: Commercial Managed Care - PPO | Attending: Nurse Practitioner | Admitting: Nurse Practitioner

## 2019-12-12 VITALS — BP 123/88 | HR 89 | Temp 98.4°F | Resp 18 | Ht 70.0 in | Wt 226.7 lb

## 2019-12-12 DIAGNOSIS — I4891 Unspecified atrial fibrillation: Secondary | ICD-10-CM | POA: Insufficient documentation

## 2019-12-12 DIAGNOSIS — N183 Chronic kidney disease, stage 3 unspecified: Secondary | ICD-10-CM | POA: Diagnosis not present

## 2019-12-12 DIAGNOSIS — Z7901 Long term (current) use of anticoagulants: Secondary | ICD-10-CM | POA: Diagnosis not present

## 2019-12-12 DIAGNOSIS — Z87442 Personal history of urinary calculi: Secondary | ICD-10-CM | POA: Diagnosis not present

## 2019-12-12 DIAGNOSIS — Z933 Colostomy status: Secondary | ICD-10-CM | POA: Diagnosis not present

## 2019-12-12 DIAGNOSIS — Z9049 Acquired absence of other specified parts of digestive tract: Secondary | ICD-10-CM | POA: Diagnosis not present

## 2019-12-12 DIAGNOSIS — Z79899 Other long term (current) drug therapy: Secondary | ICD-10-CM | POA: Insufficient documentation

## 2019-12-12 DIAGNOSIS — N400 Enlarged prostate without lower urinary tract symptoms: Secondary | ICD-10-CM | POA: Insufficient documentation

## 2019-12-12 DIAGNOSIS — Z85038 Personal history of other malignant neoplasm of large intestine: Secondary | ICD-10-CM | POA: Insufficient documentation

## 2019-12-12 DIAGNOSIS — Z9221 Personal history of antineoplastic chemotherapy: Secondary | ICD-10-CM | POA: Insufficient documentation

## 2019-12-12 DIAGNOSIS — K6289 Other specified diseases of anus and rectum: Secondary | ICD-10-CM | POA: Diagnosis not present

## 2019-12-13 ENCOUNTER — Encounter: Payer: Self-pay | Admitting: Nurse Practitioner

## 2019-12-14 ENCOUNTER — Telehealth: Payer: Self-pay | Admitting: Hematology

## 2019-12-14 NOTE — Telephone Encounter (Signed)
No 7/7 los

## 2019-12-21 ENCOUNTER — Encounter (HOSPITAL_COMMUNITY)
Admission: RE | Admit: 2019-12-21 | Discharge: 2019-12-21 | Disposition: A | Discharge status: Home / Self Care | Payer: Commercial Managed Care - PPO | Source: Ambulatory Visit | Attending: Nurse Practitioner | Admitting: Nurse Practitioner

## 2019-12-21 ENCOUNTER — Other Ambulatory Visit: Payer: Self-pay

## 2019-12-21 DIAGNOSIS — K6289 Other specified diseases of anus and rectum: Secondary | ICD-10-CM | POA: Diagnosis present

## 2019-12-21 LAB — GLUCOSE, CAPILLARY: Glucose-Capillary: 109 mg/dL — ABNORMAL HIGH (ref 70–99)

## 2019-12-21 MED ORDER — FLUDEOXYGLUCOSE F - 18 (FDG) INJECTION
11.0000 | Freq: Once | INTRAVENOUS | Status: AC | PRN
  Administered 2019-12-21: 11 via INTRAVENOUS

## 2019-12-24 ENCOUNTER — Telehealth: Payer: Self-pay | Admitting: Nurse Practitioner

## 2019-12-24 DIAGNOSIS — C186 Malignant neoplasm of descending colon: Secondary | ICD-10-CM

## 2019-12-24 NOTE — Telephone Encounter (Signed)
I called Mr. Dave Vaughn to review his PET scan which shows intensely hypermetabolic perirectal mass, as well as evidence highly concerning for local recurrence along the ostomy tract, peritoneal disease, and possible liver metastasis. We are referring him for IR biopsy for tissue confirmation and diagnosis. Will request molecular studies/FO. Currently he feels well, no pain. He is going on planned trip 7/23 - 8/2. Will schedule biopsy and f/u upon his return. He agrees with the recommendations and appreciates the call. Orders placed.   Cira Rue, NP

## 2019-12-26 ENCOUNTER — Ambulatory Visit (HOSPITAL_COMMUNITY): Payer: Commercial Managed Care - PPO

## 2019-12-26 ENCOUNTER — Encounter (HOSPITAL_COMMUNITY): Payer: Self-pay

## 2019-12-26 NOTE — Progress Notes (Signed)
Dave Vaughn Dave Vaughn" Male, 74 y.o., September 10, 1945 MRN:  [id number] Phone:  [phone] [name]) PCP:  [name], MD Primary Cvg:  [facility] Ppo Next Appt With Radiology ([facility]) 01/15/2020 at 1:00 PM  RE: Biopsy Received: Today Message Details  Markus Daft, MD  Lenore Cordia US guided core biopsy left abdominal wall mass/ liver lesion.   [name]   Previous Messages  ----- Message -----  From: Lenore Cordia  Sent: 12/25/2019 11:50 AM EDT  To: Ir Procedure Requests  Subject: Biopsy                      Procedure Requested: [place] Core Biopsy (soft tissue)    Reason for Procedure: h/o colon cancer, evidence of peritoneal metastasis possible liver mets; confirm metastatic disease and send for foundation one testing    Provider Requesting:Burton, Wilhemina Cash  Provider Telephone: [phone]    Other Info: Rad exams in Epic

## 2019-12-28 ENCOUNTER — Encounter (HOSPITAL_COMMUNITY): Payer: Self-pay

## 2019-12-28 ENCOUNTER — Telehealth: Payer: Self-pay | Admitting: Hematology

## 2019-12-28 NOTE — Telephone Encounter (Signed)
Scheduled appointments per 7/23 message. Left message on patient's voicemail with appointments date and time.

## 2019-12-28 NOTE — Progress Notes (Signed)
Dave Vaughn Dave Vaughn" Male, 74 y.o., 21-Dec-1945 Dave Vaughn:  [id number] Phone:  [phone] [name]) PCP:  [name], MD Primary Cvg:  [facility] Ppo Next Appt With Radiology ([facility]) 01/15/2020 at 1:00 PM  RE: Blood Thinner Received: Today Message Details  Alla Feeling, NP  Gypsy Lore, MD Yes, that's OK.  Thanks,  Lacie   Previous Messages  ----- Message -----  From: Lenore Cordia  Sent: 12/26/2019 11:41 AM EDT  To: Alla Feeling, NP  Subject: Blood Thinner                   Hello,   [name] is on Eliquis  He will need to hold it two days prior to biopsy.  Permission is needed.   [name]  ----- Message -----  From: Markus Daft, MD  Sent: 12/26/2019  8:29 AM EDT  To: Lenore Cordia  Subject: RE: Biopsy                    US guided core biopsy left abdominal wall mass/ liver lesion.   [name]  ----- Message -----  From: Lenore Cordia  Sent: 12/25/2019 11:50 AM EDT  To: Ir Procedure Requests  Subject: Biopsy                      Procedure Requested: [place] Core Biopsy (soft tissue)    Reason for Procedure: h/o colon cancer, evidence of peritoneal metastasis possible liver mets; confirm metastatic disease and send for foundation one testing    Provider Requesting:Burton, Wilhemina Cash  Provider Telephone: [phone]    Other Info: Rad exams in Epic

## 2020-01-11 ENCOUNTER — Other Ambulatory Visit: Payer: Self-pay | Admitting: Radiology

## 2020-01-14 ENCOUNTER — Other Ambulatory Visit: Payer: Self-pay | Admitting: Radiology

## 2020-01-14 NOTE — Progress Notes (Signed)
[place]   Telephone:([phone]) [phone] Fax:(336) [phone]   Clinic Follow up Note   Patient Care Team: [name], MD as PCP - General (Family Medicine) [name], MD as Consulting Physician (Cardiology) [name], MD as Consulting Physician (Gastroenterology) [name], MD as Consulting Physician (General Surgery) [name], MD as Consulting Physician (General Surgery) [name], MD as Consulting Physician (Urology) [name], MD as Consulting Physician (Medical Oncology)  Date of Service:  01/17/2020  CHIEF COMPLAINT: F/u on colon cancer  SUMMARY OF ONCOLOGIC HISTORY: Oncology History Overview Note  Cancer Staging Cancer of left colon [facility]) Staging form: Colon and Rectum, AJCC 8th Edition - Pathologic stage from 01/11/2018: Stage IIIB (pT3, pN1c, cM0) - Signed by [name], MD on 01/16/2018     Cancer of left colon ([place])  01/10/2018 Imaging   CT AP W Contrast 01/10/18  IMPRESSION: Irregular soft tissue density causing stricture of the mid descending colon likely the site of obstruction for the dilated small bowel. 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 No evidence of perforation.  Equivocal findings involving the appendix measuring 1.2 cm at the appendiceal tip with mucosal enhancement. No adjacent free fluid or inflammatory change. Findings are nonspecific, but can be seen with early acute appendicitis.  Mild prostatic enlargement. Increased density over the posterior bladder base likely due to the large prostatic impression although cannot completely exclude a bladder mass. Urology protocol CT or ultrasound may be helpful for better evaluation.  Mild cholelithiasis.  Stable 1.5 cm cystic structure over the lower pole right kidney likely slightly hyperdense cyst.  Diverticulosis of the colon.  Aortic Atherosclerosis (ICD10-I70.0).     01/11/2018 Cancer Staging   Staging form: Colon and Rectum, AJCC  8th Edition - Pathologic stage from 01/11/2018: Stage IIIB (pT3, pN1c, cM0) - Signed by [name], MD on 01/16/2018   01/11/2018 Surgery   LEFT COLON RESECTION, TAKEDOWN SPLENIC FLEXURE, COLOSTOMY by Dr. [name]    01/11/2018 Procedure   Colonoscopy 01/11/18 by Dr. [name]  - Malignant completely obstructing tumor in the mid descending colon. Tattooed. - Diverticulosis in the sigmoid colon. - Non-bleeding internal hemorrhoids. - No specimens collected.   01/11/2018 Pathology Results   Diagnosis 01/11/18  1. Colon, segmental resection for tumor, descending colon - INVASIVE COLORECTAL ADENOCARCINOMA, 4 CM. - TUMOR EXTENDS INTO PERICOLONIC CONNECTIVE TISSUE. - TUMOR FOCALLY INVOLVES RADIAL MARGIN. - ONE MESENTERIC TUMOR DEPOSIT. - THIRTEEN BENIGN LYMPH NODES (0/13). 2. Colon, segmental resection, splenic flexure - BENIGN COLON. - NO EVIDENCE OF MALIGNANCY .   01/11/2018 Tumor Marker   Baseline CEA at 3.4   01/16/2018 Initial Diagnosis   Cancer of left colon ([place])   01/23/2018 Imaging   CT CHEST WO CONTRAST IMPRESSION: 1. No evidence for metastatic disease within the chest. 2. Small left pleural effusion with underlying opacities which may represent atelectasis. Right basilar atelectasis. 3. Few foci of gas within the upper abdomen in the omentum with surrounding fat stranding, likely postsurgical 4. Aortic Atherosclerosis (ICD10-I70.0).   03/08/2018 - 05/15/2018 Chemotherapy   adjuvant FOLOFX. Due to side effects of neuropahty Oxaliplatin was stopped after 3 cycles and chemo was stopped after 6 cycles. He declined completing 6 months of chemo treatment.     03/19/2018 Imaging   03/19/2018 CT AP IMPRESSION: 1. Interval partial left hemicolectomy and descending colostomy. 2. Heterogeneous soft tissue density along the left anterior renal fascia is most likely postoperative (favor fat necrosis). No well-defined fluid collection. 3. Mild left lower quadrant  edema, new since 01/10/2018. This  could be postoperative. Superimposed sigmoid diverticulitis and/or cystitis cannot be excluded. 4. Subtle hyperenhancing nodule within the anterior bladder dome cannot be excluded. Consider nonemergent cystoscopy. When this is performed, recommend attention to the left ureterovesicular junction and distal left ureter to evaluate questionable soft tissue fullness. 5. Cholelithiasis. 6.  Aortic Atherosclerosis (ICD10-I70.0). 7. Prostatomegaly.   11/13/2018 Imaging   CT CAP WO Contrast 11/13/18  IMPRESSION: 1. Reversal of left lower quadrant colostomy with sigmoid colon anastomosis. No complicating features. No findings for residual or recurrent tumor or metastatic disease involving the chest, abdomen or pelvis without contrast. 2. No acute abdominal/pelvic findings. 3. Gallbladder sludge and gallstones but no findings for acute cholecystitis. 4. Stable anterior abdominal wall hernia. 5. The right testicle is in the right inguinal canal.   10/03/2019 Imaging   CT CAP WO contrast  IMPRESSION: 1. New rounded density interposed between the prostate gland and anterior upper rectal wall could represent adenopathy or local extension of anterior rectal tumor. 2. Marked prostatomegaly, prostate volume 150 cubic cm. 3. Other imaging findings of potential clinical significance: Aortic Atherosclerosis (ICD10-I70.0). Coronary atherosclerosis. Trace right pleural effusion. Suspected cholelithiasis. Nonobstructive left nephrolithiasis. Multilevel lumbar impingement. Bilateral mildly retracted testicles. Hypodense exophytic lesion of the right kidney, most likely to be a cyst.   11/02/2019 Procedure   colonoscopy on 11/02/2019 by Dr. [name] Plan showed normal digital rectal exam, 3 polyps in the rectum, descending colon and cecum, and a prior sigmoid: Anastomosis characterized by erythema.  He found an extrinsic nonobstructing medium-sized mass in the proximal rectum about 4 cm in length, no internal  rectal mass.   Diagnosis Surgical [P], colon, cecum, descending, rectal, polyp (3) - TUBULAR ADENOMA (TWO) - NO HIGH GRADE DYSPLASIA OR CARCINOMA. - COLONIC FRAGMENT WITH BENIGN LYMPHOID AGGREGATE.   12/07/2019 Imaging   MRI pelvis IMPRESSION: 1. Masslike area in the rectum suspicious for rectal neoplasm, likely T4b based on the appearance of soft tissue extending along the anterior peritoneal reflection and into the seminal vesicles. Correlation with recent colonoscopy results may be helpful. Area of anastomosis and other areas of the pelvis are not imaged on today's exam.   12/21/2019 PET scan   IMPRESSION: 1. Unfortunately evidence for peritoneal metastasis. Intensely hypermetabolic nodules along the LEFT pericolic gutter. Favor hypermetabolic mass anterior to the rectum to represent serosal implant along the ventral surface of the rectum. 2. local recurrence within the LEFT abdominal wall at site prior colostomy. Intense hypermetabolic thickening of the rectus muscle at this site. 3. Two hypermetabolic hepatic metastasis.   01/15/2020 Relapse/Recurrence   FINAL MICROSCOPIC DIAGNOSIS:   A. SOFT TISSUE, LEFT ABDOMINAL WALL, BIOPSY:  - Adenocarcinoma.  - See comment.   COMMENT:   The morphology is consistent with metastatic colorectal adenocarcinoma.    01/28/2020 -  Chemotherapy   PENDING First line FOLFIRI q2weeks starting 01/28/20      CURRENT THERAPY:  PENDING First line FOLFIRI q2weeks starting 01/28/20  INTERVAL HISTORY:  Dave Vaughn is here for a follow up of colon cancer. He presents to the clinic with his wife. He notes he is doing well. He denies any new changes. He notes he biopsy went well. 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 He is interested in chemo treatment.  He plans to go to beach on 8/28.     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  MEDICAL HISTORY:  Past Medical History:  Diagnosis Date  . Adenocarcinoma, colon ([place]) dx'd 01/2018  . Anemia    taking iron supplements  . Anxiety   . Atrial fibrillation with RVR ([place])   . Colonic obstruction ([place]) 01/10/2018  . Depression   . Diverticulitis   . Dysrhythmia    afib  . History of kidney stones     SURGICAL HISTORY: Past Surgical History:  Procedure Laterality Date  . ANKLE SURGERY Left    when he was in college  . COLON RESECTION N/A 01/11/2018   Procedure: LEFT COLON RESECTION, TAKEDOWN SPLENIC FLEXURE, COLOSTOMY;  Surgeon: [name], MD;  Location: [facility];  Service: General;  Laterality: N/A;  . COLONOSCOPY  01/11/2018   Procedure: COLONOSCOPY;  Surgeon: Jackquline Denmark, MD;  Location: [facility];  Service: Endoscopy;;  . COLONOSCOPY  05/10/2018  . colonscopy  05/10/2018  . HERNIA REPAIR    . HERNIA REPAIR  03/02/2019   EXPLORATORY LAPAROTOMY (N/A Abdomen)  . INCISIONAL HERNIA REPAIR N/A 03/02/2019   Procedure: INCISIONAL HERNIA REPAIR , RECTORECTUS VS TAR HERNIA REPAIR;  Surgeon: Ralene Ok, MD;  Location: [place];  Service: General;  Laterality: N/A;  . INSERTION OF MESH N/A 03/02/2019   Procedure: Insertion Of Mesh;  Surgeon: Ralene Ok, MD;  Location: [place];  Service: General;  Laterality: N/A;  . LAPAROTOMY N/A 03/02/2019   Procedure: EXPLORATORY LAPAROTOMY;  Surgeon: Ralene Ok, MD;  Location: [place];  Service: General;   Laterality: N/A;  . LYSIS OF ADHESION N/A 06/12/2018   Procedure: LYSIS OF ADHESIONS;  Surgeon: [name], MD;  Location: [facility];  Service: General;  Laterality: N/A;  . LYSIS OF ADHESION N/A 03/02/2019   Procedure: Lysis Of Adhesion;  Surgeon: Ralene Ok, MD;  Location: [place];  Service: General;  Laterality: N/A;  . PORTACATH PLACEMENT Right 03/07/2018   Procedure: INSERTION PORT-A-CATH RIGHT SUBCLAVIAN;  Surgeon: [name], MD;  Location: [place];  Service: General;  Laterality: Right;  . PROCTOSCOPY N/A 06/12/2018   Procedure: RIGID PROCTOSCOPY;  Surgeon: [name], MD;  Location: [facility];  Service: General;  Laterality: N/A;  . thumb surgery   2018   cyst removal    I have reviewed the social history and family history with the patient and they are unchanged from previous note.  ALLERGIES:  has No Known Allergies.  MEDICATIONS:  Current Outpatient Medications  Medication Sig Dispense Refill  . apixaban (ELIQUIS) 5 MG TABS tablet Take 1 tablet (5 mg total) by mouth 2 (two) times daily. 60 tablet 0  . atorvastatin (LIPITOR) 40 MG tablet Take 40 mg by mouth daily.    [name] b complex vitamins tablet Take 1 tablet by mouth daily.    [name] diltiazem (CARDIZEM CD) 240 MG 24 hr capsule Take 240 mg by mouth daily.    [name] docusate sodium (COLACE) 100 MG capsule Take 100 mg by mouth 2 (two) times daily.    . isosorbide mononitrate (IMDUR) 30 MG 24 hr tablet Take 30 mg by mouth daily.    [name]  levothyroxine (SYNTHROID) 125 MCG tablet Take 125 mcg by mouth daily.    . Multiple Vitamins-Iron (MULTIVITAMIN/IRON PO) Take 1 tablet by mouth daily.     . tamsulosin (FLOMAX) 0.4 MG CAPS capsule Take 0.4 mg by mouth 2 (two) times daily.      No current facility-administered medications for this visit.    PHYSICAL EXAMINATION: ECOG PERFORMANCE STATUS: 0 - Asymptomatic  Vitals:   01/17/20 1044  BP: 121/81  Pulse: 79  Resp: 20  Temp: (!) 96.4 F (35.8 C)  SpO2: 98%   Filed Weights   01/17/20  1044  Weight: 225 lb 8 oz (102.3 kg)    Due to COVID19 we will limit examination to appearance. Patient had no complaints.  GENERAL:alert, no distress and comfortable SKIN: skin color normal, no rashes or significant lesions EYES: normal, Conjunctiva are pink and non-injected, sclera clear  NEURO: alert & oriented x 3 with fluent speech   LABORATORY DATA:  I have reviewed the data as listed CBC Latest Ref Rng & Units 01/17/2020 01/15/2020 11/26/2019  WBC 4.0 - 10.5 K/uL 6.3 5.4 6.4  Hemoglobin 13.0 - 17.0 g/dL 12.7(L) 12.1(L) 14.1  Hematocrit 39 - 52 % 38.3(L) 36.7(L) 42.6  Platelets 150 - 400 K/uL 243 217 188     CMP Latest Ref Rng & Units 01/17/2020 01/15/2020 11/26/2019  Glucose 70 - 99 mg/dL 97 103(H) 100(H)  BUN 8 - 23 mg/dL 23 34(H) 26(H)  Creatinine 0.61 - 1.24 mg/dL 1.37(H) 1.23 1.50(H)  Sodium 135 - 145 mmol/L 141 139 143  Potassium 3.5 - 5.1 mmol/L 4.2 3.8 4.4  Chloride 98 - 111 mmol/L 106 104 107  CO2 22 - 32 mmol/L 27 26 26   Calcium 8.9 - 10.3 mg/dL 9.6 8.7(L) 9.3  Total Protein 6.5 - 8.1 g/dL 7.2 6.8 6.9  Total Bilirubin 0.3 - 1.2 mg/dL 0.5 0.5 0.8  Alkaline Phos 38 - 126 U/L 78 76 74  AST 15 - 41 U/L 15 20 20   ALT 0 - 44 U/L 11 17 15       RADIOGRAPHIC STUDIES: I have personally reviewed the radiological images as listed and agreed with the findings in the report. No results found.   ASSESSMENT & PLAN:  ZAN ORLICK is a 74 y.o. male with    1. Cancer of left colon,adenocarcinoma, stage IIIB(pT3N1cM0), MSI-stable, liver and peritoneal recurrence 12/2019 -Diagnosed in 01/2018. Treated with surgery andadjuvantFOLFOX. Due to side effects Oxaliplatin was stopped after 3 cycles and chemo was stopped after 6 cycles. He declined completing 6 months of chemo treatment. -His PET from 12/21/19 shows peritoneal metastasis, Two hypermetabolic hepatic metastasis and local recurrence within the left abdominal wall. His US Biopsy of abdominal wall from 01/15/20 shows  metastatic colorectal adenocarcinoma.  I discussed with pt and his wife  -I discussed given the degree of metastasis, he has stage IV cancer that is not resectable and not curable. But it is still treatable to control disease and prolong life.  -I recommend treatment with chemotherapy with FOLFIRI q2weeks (he had poor tolerance to Oxaliplatin before). If disease well controlled after at least 4-6 months, we can switch to maintenance chemo with 5FU or oral Xeloda.   --Chemotherapy consent: Side effects including but does not limited to, fatigue, nausea, vomiting, diarrhea, hair loss, neuropathy, fluid retention, renal and kidney dysfunction, neutropenic fever, needed for blood transfusion, bleeding, were discussed with patient in great detail. She agrees to proceed. -The goal of chemotherapy is palliative, to prolong his life -  He still has Port in place. Plan to start chemo on 8/23 -I did request [facility] on his biopsy sample to determine if he is eligible for target therapy. His MSI-Stable cancer is not eligible for standard immunotherapy. I may add target therapy to C3 chemo based on results.  -Labs reviewed, CBC And CMP WNL except Hg 12.7, Cr 1.37. CEA still pending.  -f/u with start of chemo.    2. Atrial Fibrillation -continue Eliquis. Rate controlled.  He can stop Eliquis periodically for bleeding hemorrhoids -Continue follow-up with cardiology Dr Lyla Son  3.CKD stage III -I encouraged him to control his blood pressure, cholesterol and BG. I also encouraged him to drink plenty of water. -Will avoid NSAIDs and CT IV contrast if GFR low -Will monitor on chemo.   4. BPH  -Followed by urologist Dr. Lovena Neighbours -Pelvic MRI on 7/1 shows marked prostatomegaly with signs of BPH extending into the bladder base -Denies new or worsening urinary symptoms -Due to the proximity to the bladder, may discuss biopsy options with Dr. Gilford Rile if tissue sampling proves otherwise difficult   5. Goal of  care discussion  -We again discussed the incurable nature of his cancer, and the overall poor prognosis, especially if he does not have good response to chemotherapy or progress on chemo -The patient understands the goal of care is palliative. -he is full code now    Plan -Lab, flush, f/u and FOLFIRI on 8/23, 9/7, may reduce dose for first cycle due to his vacation on 8/28    No problem-specific Assessment & Plan notes found for this encounter.   No orders of the defined types were placed in this encounter.  All questions were answered. The patient knows to call the clinic with any problems, questions or concerns. No barriers to learning was detected. The total time spent in the appointment was 40 minutes.     [name], MD 01/17/2020   I, Joslyn Devon, am acting as scribe for [name], MD.   I have reviewed the above documentation for accuracy and completeness, and I agree with the above.

## 2020-01-15 ENCOUNTER — Other Ambulatory Visit: Payer: Self-pay

## 2020-01-15 ENCOUNTER — Ambulatory Visit (HOSPITAL_COMMUNITY)
Admission: RE | Admit: 2020-01-15 | Discharge: 2020-01-15 | Disposition: A | Discharge status: Home / Self Care | Payer: Commercial Managed Care - PPO | Source: Ambulatory Visit | Attending: Hematology | Admitting: Hematology

## 2020-01-15 ENCOUNTER — Ambulatory Visit (HOSPITAL_COMMUNITY): Payer: Commercial Managed Care - PPO

## 2020-01-15 ENCOUNTER — Ambulatory Visit (HOSPITAL_COMMUNITY)
Admission: RE | Admit: 2020-01-15 | Discharge: 2020-01-15 | Disposition: A | Discharge status: Home / Self Care | Payer: Commercial Managed Care - PPO | Source: Ambulatory Visit | Attending: Nurse Practitioner | Admitting: Nurse Practitioner

## 2020-01-15 DIAGNOSIS — Z803 Family history of malignant neoplasm of breast: Secondary | ICD-10-CM | POA: Diagnosis not present

## 2020-01-15 DIAGNOSIS — Z800 Family history of malignant neoplasm of digestive organs: Secondary | ICD-10-CM | POA: Insufficient documentation

## 2020-01-15 DIAGNOSIS — Z7901 Long term (current) use of anticoagulants: Secondary | ICD-10-CM | POA: Diagnosis not present

## 2020-01-15 DIAGNOSIS — Z9049 Acquired absence of other specified parts of digestive tract: Secondary | ICD-10-CM | POA: Diagnosis not present

## 2020-01-15 DIAGNOSIS — C7989 Secondary malignant neoplasm of other specified sites: Secondary | ICD-10-CM | POA: Insufficient documentation

## 2020-01-15 DIAGNOSIS — D649 Anemia, unspecified: Secondary | ICD-10-CM | POA: Insufficient documentation

## 2020-01-15 DIAGNOSIS — C786 Secondary malignant neoplasm of retroperitoneum and peritoneum: Secondary | ICD-10-CM | POA: Insufficient documentation

## 2020-01-15 DIAGNOSIS — Z79890 Hormone replacement therapy (postmenopausal): Secondary | ICD-10-CM | POA: Diagnosis not present

## 2020-01-15 DIAGNOSIS — C785 Secondary malignant neoplasm of large intestine and rectum: Secondary | ICD-10-CM | POA: Insufficient documentation

## 2020-01-15 DIAGNOSIS — Z79899 Other long term (current) drug therapy: Secondary | ICD-10-CM | POA: Diagnosis not present

## 2020-01-15 DIAGNOSIS — C787 Secondary malignant neoplasm of liver and intrahepatic bile duct: Secondary | ICD-10-CM | POA: Insufficient documentation

## 2020-01-15 DIAGNOSIS — R9389 Abnormal findings on diagnostic imaging of other specified body structures: Secondary | ICD-10-CM | POA: Diagnosis present

## 2020-01-15 DIAGNOSIS — C189 Malignant neoplasm of colon, unspecified: Secondary | ICD-10-CM | POA: Diagnosis not present

## 2020-01-15 DIAGNOSIS — C186 Malignant neoplasm of descending colon: Secondary | ICD-10-CM

## 2020-01-15 LAB — CBC WITH DIFFERENTIAL/PLATELET
Abs Immature Granulocytes: 0.03 10*3/uL (ref 0.00–0.07)
Basophils Absolute: 0 10*3/uL (ref 0.0–0.1)
Basophils Relative: 1 %
Eosinophils Absolute: 0.1 10*3/uL (ref 0.0–0.5)
Eosinophils Relative: 2 %
HCT: 36.7 % — ABNORMAL LOW (ref 39.0–52.0)
Hemoglobin: 12.1 g/dL — ABNORMAL LOW (ref 13.0–17.0)
Immature Granulocytes: 1 %
Lymphocytes Relative: 30 %
Lymphs Abs: 1.6 10*3/uL (ref 0.7–4.0)
MCH: 31 pg (ref 26.0–34.0)
MCHC: 33 g/dL (ref 30.0–36.0)
MCV: 94.1 fL (ref 80.0–100.0)
Monocytes Absolute: 0.3 10*3/uL (ref 0.1–1.0)
Monocytes Relative: 6 %
Neutro Abs: 3.3 10*3/uL (ref 1.7–7.7)
Neutrophils Relative %: 60 %
Platelets: 217 10*3/uL (ref 150–400)
RBC: 3.9 MIL/uL — ABNORMAL LOW (ref 4.22–5.81)
RDW: 13.3 % (ref 11.5–15.5)
WBC: 5.4 10*3/uL (ref 4.0–10.5)
nRBC: 0 % (ref 0.0–0.2)

## 2020-01-15 LAB — COMPREHENSIVE METABOLIC PANEL
ALT: 17 U/L (ref 0–44)
AST: 20 U/L (ref 15–41)
Albumin: 4.1 g/dL (ref 3.5–5.0)
Alkaline Phosphatase: 76 U/L (ref 38–126)
Anion gap: 9 (ref 5–15)
BUN: 34 mg/dL — ABNORMAL HIGH (ref 8–23)
CO2: 26 mmol/L (ref 22–32)
Calcium: 8.7 mg/dL — ABNORMAL LOW (ref 8.9–10.3)
Chloride: 104 mmol/L (ref 98–111)
Creatinine, Ser: 1.23 mg/dL (ref 0.61–1.24)
GFR calc Af Amer: >60 mL/min (ref >60)
GFR calc non Af Amer: 58 mL/min — ABNORMAL LOW (ref >60)
Glucose, Bld: 103 mg/dL — ABNORMAL HIGH (ref 70–99)
Potassium: 3.8 mmol/L (ref 3.5–5.1)
Sodium: 139 mmol/L (ref 135–145)
Total Bilirubin: 0.5 mg/dL (ref 0.3–1.2)
Total Protein: 6.8 g/dL (ref 6.5–8.1)

## 2020-01-15 LAB — PROTIME-INR
INR: 1 (ref 0.8–1.2)
Prothrombin Time: 12.7 seconds (ref 11.4–15.2)

## 2020-01-15 MED ORDER — MIDAZOLAM HCL 2 MG/2ML IJ SOLN
INTRAMUSCULAR | Status: AC | PRN
  Administered 2020-01-15: 1 mg via INTRAVENOUS

## 2020-01-15 MED ORDER — SODIUM CHLORIDE 0.9 % IV SOLN: INTRAVENOUS | Status: DC

## 2020-01-15 MED ORDER — MIDAZOLAM HCL 2 MG/2ML IJ SOLN
INTRAMUSCULAR | Status: AC
  Filled 2020-01-15: qty 2

## 2020-01-15 MED ORDER — GELATIN ABSORBABLE 12-7 MM EX MISC
CUTANEOUS | Status: AC
  Filled 2020-01-15: qty 1

## 2020-01-15 MED ORDER — FENTANYL CITRATE (PF) 100 MCG/2ML IJ SOLN
INTRAMUSCULAR | Status: AC
  Filled 2020-01-15: qty 2

## 2020-01-15 MED ORDER — HEPARIN SOD (PORK) LOCK FLUSH 100 UNIT/ML IV SOLN
500.0000 [IU] | INTRAVENOUS | Status: AC | PRN
  Administered 2020-01-15: 500 [IU]
  Filled 2020-01-15: qty 5

## 2020-01-15 MED ORDER — FENTANYL CITRATE (PF) 100 MCG/2ML IJ SOLN
INTRAMUSCULAR | Status: AC | PRN
  Administered 2020-01-15 (×2): 50 ug via INTRAVENOUS

## 2020-01-15 MED ORDER — LIDOCAINE HCL 1 % IJ SOLN
INTRAMUSCULAR | Status: AC
  Filled 2020-01-15: qty 20

## 2020-01-15 MED ORDER — LIDOCAINE HCL (PF) 1 % IJ SOLN
INTRAMUSCULAR | Status: AC | PRN
  Administered 2020-01-15: 10 mL via INTRADERMAL

## 2020-01-15 NOTE — Discharge Instructions (Signed)
Please call Interventional Radiology clinic [phone] with any questions or concerns.  You may remove your dressing and shower tomorrow.   Needle Biopsy, Care After These instructions tell you how to care for yourself after your procedure. Your doctor may also give you more specific instructions. Call your doctor if you have any problems or questions. What can I expect after the procedure? After the procedure, it is common to have:  Soreness.  Bruising.  Mild pain. Follow these instructions at home:   Return to your normal activities as told by your doctor. Ask your doctor what activities are safe for you.  Take over-the-counter and prescription medicines only as told by your doctor.  Wash your hands with soap and water before you change your bandage (dressing). If you cannot use soap and water, use hand sanitizer.  Follow instructions from your doctor about: ? How to take care of your puncture site. ? When and how to change your bandage. ? When to remove your bandage.  Check your puncture site every day for signs of infection. Watch for: ? Redness, swelling, or pain. ? Fluid or blood. ? Pus or a bad smell. ? Warmth.  Do not take baths, swim, or use a hot tub until your doctor approves. Ask your doctor if you may take showers. You may only be allowed to take sponge baths.  Keep all follow-up visits as told by your doctor. This is important. Contact a doctor if you have:  A fever.  Redness, swelling, or pain at the puncture site, and it lasts longer than a few days.  Fluid, blood, or pus coming from the puncture site.  Warmth coming from the puncture site. Get help right away if:  You have a lot of bleeding from the puncture site. Summary  After the procedure, it is common to have soreness, bruising, or mild pain at the puncture site.  Check your puncture site every day for signs of infection, such as redness, swelling, or pain.  Get help right away if you  have severe bleeding from your puncture site. This information is not intended to replace advice given to you by your health care provider. Make sure you discuss any questions you have with your health care provider. Document Revised: 06/06/2017 Document Reviewed: 06/06/2017 Elsevier Patient Education  2020 [employer]   Moderate Conscious Sedation, Adult, Care After These instructions provide you with information about caring for yourself after your procedure. Your health care provider may also give you more specific instructions. Your treatment has been planned according to current medical practices, but problems sometimes occur. Call your health care provider if you have any problems or questions after your procedure. What can I expect after the procedure? After your procedure, it is common:  To feel sleepy for several hours.  To feel clumsy and have poor balance for several hours.  To have poor judgment for several hours.  To vomit if you eat too soon. Follow these instructions at home: For at least 24 hours after the procedure:   Do not: ? Participate in activities where you could fall or become injured. ? Drive. ? Use heavy machinery. ? Drink alcohol. ? Take sleeping pills or medicines that cause drowsiness. ? Make important decisions or sign legal documents. ? Take care of children on your own.  Rest. Eating and drinking  Follow the diet recommended by your health care provider.  If you vomit: ? Drink water, juice, or soup when you can drink without vomiting. ?   Make sure you have little or no nausea before eating solid foods. General instructions  Have a responsible adult stay with you until you are awake and alert.  Take over-the-counter and prescription medicines only as told by your health care provider.  If you smoke, do not smoke without supervision.  Keep all follow-up visits as told by your health care provider. This is important. Contact a health care  provider if:  You keep feeling nauseous or you keep vomiting.  You feel light-headed.  You develop a rash.  You have a fever. Get help right away if:  You have trouble breathing. This information is not intended to replace advice given to you by your health care provider. Make sure you discuss any questions you have with your health care provider. Document Revised: 05/06/2017 Document Reviewed: 09/13/2015 Elsevier Patient Education  2020 [employer].   

## 2020-01-15 NOTE — Consult Note (Signed)
Chief Complaint: Patient was seen in consultation today for image guided abdominal wall mass biopsy  Referring Physician(s): Burton,Lacie K/Feng,Y  Supervising Physician: Aletta Edouard  Patient Status: [facility] - Out-pt  History of Present Illness: Dave Vaughn is a 74 y.o. male with history of colon cancer diagnosed in 2019, status post surgery and chemotherapy.  Recent PET scan has revealed:   1. Unfortunately evidence for peritoneal metastasis. Intensely hypermetabolic nodules along the LEFT pericolic gutter. Favor hypermetabolic mass anterior to the rectum to represent serosal implant along the ventral surface of the rectum. 2. local recurrence within the LEFT abdominal wall at site prior colostomy. Intense hypermetabolic thickening of the rectus muscle at this site. 3. Two hypermetabolic hepatic metastas    He presents today for image guided biopsy of the left abdominal wall mass for further evaluation.  He has a rising CEA level as well. Additional medical history as below.   Past Medical History:  Diagnosis Date  . Adenocarcinoma, colon ([place]) dx'd 01/2018  . Anemia    taking iron supplements  . Anxiety   . Atrial fibrillation with RVR ([place])   . Colonic obstruction ([place]) 01/10/2018  . Depression   . Diverticulitis   . Dysrhythmia    afib  . History of kidney stones     Past Surgical History:  Procedure Laterality Date  . ANKLE SURGERY Left    when he was in college  . COLON RESECTION N/A 01/11/2018   Procedure: LEFT COLON RESECTION, TAKEDOWN SPLENIC FLEXURE, COLOSTOMY;  Surgeon: [name], MD;  Location: [facility];  Service: General;  Laterality: N/A;  . COLONOSCOPY  01/11/2018   Procedure: COLONOSCOPY;  Surgeon: Jackquline Denmark, MD;  Location: [facility];  Service: Endoscopy;;  . COLONOSCOPY  05/10/2018  . colonscopy  05/10/2018  . HERNIA REPAIR    . HERNIA REPAIR  03/02/2019   EXPLORATORY LAPAROTOMY (N/A Abdomen)  . INCISIONAL HERNIA REPAIR N/A 03/02/2019    Procedure: INCISIONAL HERNIA REPAIR , RECTORECTUS VS TAR HERNIA REPAIR;  Surgeon: Ralene Ok, MD;  Location: [facility];  Service: General;  Laterality: N/A;  . INSERTION OF MESH N/A 03/02/2019   Procedure: Insertion Of Mesh;  Surgeon: Ralene Ok, MD;  Location: [facility];  Service: General;  Laterality: N/A;  . LAPAROTOMY N/A 03/02/2019   Procedure: EXPLORATORY LAPAROTOMY;  Surgeon: Ralene Ok, MD;  Location: [facility];  Service: General;  Laterality: N/A;  . LYSIS OF ADHESION N/A 06/12/2018   Procedure: LYSIS OF ADHESIONS;  Surgeon: [name], MD;  Location: [facility];  Service: General;  Laterality: N/A;  . LYSIS OF ADHESION N/A 03/02/2019   Procedure: Lysis Of Adhesion;  Surgeon: Ralene Ok, MD;  Location: [facility];  Service: General;  Laterality: N/A;  . PORTACATH PLACEMENT Right 03/07/2018   Procedure: INSERTION PORT-A-CATH RIGHT SUBCLAVIAN;  Surgeon: [name], MD;  Location: [place];  Service: General;  Laterality: Right;  . PROCTOSCOPY N/A 06/12/2018   Procedure: RIGID PROCTOSCOPY;  Surgeon: [name], MD;  Location: [facility];  Service: General;  Laterality: N/A;  . thumb surgery   2018   cyst removal    Allergies: Patient has no known allergies.  Medications: Prior to Admission medications   Medication Sig Start Date End Date Taking? Authorizing Provider  apixaban (ELIQUIS) 5 MG TABS tablet Take 1 tablet (5 mg total) by mouth 2 (two) times daily. 01/17/18   Georgette Shell, MD  atorvastatin (LIPITOR) 40 MG tablet Take 40 mg by mouth daily.  [provider]  b complex vitamins tablet Take 1 tablet by mouth daily.    [provider]  diltiazem (CARDIZEM CD) 240 MG 24 hr capsule Take 240 mg by mouth daily.    [provider]  docusate sodium (COLACE) 100 MG capsule Take 100 mg by mouth 2 (two) times daily.    [provider]  isosorbide mononitrate (IMDUR) 30 MG 24 hr tablet Take 30 mg by mouth daily.    [provider]    levothyroxine (SYNTHROID) 125 MCG tablet Take 125 mcg by mouth daily. 07/18/19   [provider]  Multiple Vitamins-Iron (MULTIVITAMIN/IRON PO) Take 1 tablet by mouth daily.     [provider]  tamsulosin (FLOMAX) 0.4 MG CAPS capsule Take 0.4 mg by mouth 2 (two) times daily.     [provider]     Family History  Problem Relation Age of Onset  . Breast cancer Mother 63       metastatin; recurrence x7  . Heart attack Father 15  . Cancer Daughter        melanoma, leukemia  . Esophageal cancer Neg Hx   . Colon cancer Neg Hx   . Rectal cancer Neg Hx   . Ulcerative colitis Neg Hx     Social History   Socioeconomic History  . Marital status: Married    Spouse name: Not on file  . Number of children: Not on file  . Years of education: Not on file  . Highest education level: Not on file  Occupational History  . Not on file  Tobacco Use  . Smoking status: Never Smoker  . Smokeless tobacco: Former Systems developer    Types: Secondary school teacher  . Vaping Use: Never used  Substance and Sexual Activity  . Alcohol use: Yes    Alcohol/week: 2.0 standard drinks    Types: 1 Cans of beer, 1 Shots of liquor per week  . Drug use: Never  . Sexual activity: Not on file  Other Topics Concern  . Not on file  Social History Narrative  . Not on file   Social Determinants of Health   Financial Resource Strain:   . Difficulty of Paying Living Expenses:   Food Insecurity:   . Worried About Charity fundraiser in the Last Year:   . Arboriculturist in the Last Year:   Transportation Needs:   . Film/video editor (Medical):   [name] Lack of Transportation (Non-Medical):   Physical Activity:   . Days of Exercise per Week:   . Minutes of Exercise per Session:   Stress:   . Feeling of Stress :   Social Connections:   . Frequency of Communication with Friends and Family:   . Frequency of Social Gatherings with Friends and Family:   . Attends Religious Services:   . Active  Member of Clubs or Organizations:   . Attends Archivist Meetings:   [name] Marital Status:       Review of Systems denies fever, headache, chest pain, dyspnea, cough, back pain, nausea, vomiting or bleeding.  Does have some left-sided abdominal discomfort.  Vital Signs: BP 116/88   Pulse 86   Temp 98.5 F (36.9 C) (Oral)   Resp 18   SpO2 97%   Physical Exam awake, alert.  Chest clear to auscultation bilaterally.  Clean, intact right chest wall Port-A-Cath.  Heart with irregularly irregular rhythm, nl rate.  Abdomen soft, positive bowel sounds, palpable  left abdominal wall mass, slightly tender to palpation.  No lower extremity edema.  Imaging: NM PET Image Initial (PI) Skull Base To Thigh  Result Date: 12/21/2019 CLINICAL DATA:  Subsequent treatment strategy for colorectal carcinoma. Concern for perirectal lesion EXAM: NUCLEAR MEDICINE PET SKULL BASE TO THIGH TECHNIQUE: 11.0 mCi F-18 FDG was injected intravenously. Full-ring PET imaging was performed from the skull base to thigh after the radiotracer. CT data was obtained and used for attenuation correction and anatomic localization. Fasting blood glucose: 109 mg/dl COMPARISON:  MRI 12/06/2019, CT 10/03/2019 FINDINGS: Mediastinal blood pool activity: SUV max 3.1 Liver activity: SUV max NA NECK: No hypermetabolic lymph nodes in the neck. Incidental CT findings: none CHEST: No hypermetabolic mediastinal or hilar nodes. No suspicious pulmonary nodules on the CT scan. Incidental CT findings: none ABDOMEN/PELVIS: The nodule of concern position between the anterior wall the rectum and the superior aspect of the Seminal vesicles is intensely hypermetabolic with SUV max equal 18.5. The mass is associated with the serosal surface of the rectum measuring 2 cm on image 181/4. There is evidence of peritoneal metastasis elsewhere in the abdomen pelvis. For example nodular lesion in the LEFT pericolic gutter just inferior to the spleen measures 2.7 cm  with SUV max equal 10.4. Nodular implant adjacent to the descending colon measures 17 mm on image 122. There is metastatic deposits along the prior colon ostomy tract within the LEFT rectus abdominus muscle with SUV max equal 10.0. This muscle is thickened to 2.3 cm. There is a low-density lesion in the inferior RIGHT hepatic lobe measuring 1.5 cm with SUV max equal 13.5. Additional lesion in the anterior LEFT hepatic lobe not well seen on CT measuring approximately 10 mm with SUV max equal 7.7 on image 107. Additional lesion within the ventral peritoneal surface LEFT of midline which also appears within the musculature with SUV max equal 7.4 Incidental CT findings: Enlarged prostate gland without metabolic activity. SKELETON: No focal hypermetabolic activity to suggest skeletal metastasis. Incidental CT findings: none IMPRESSION: 1. Unfortunately evidence for peritoneal metastasis. Intensely hypermetabolic nodules along the LEFT pericolic gutter. Favor hypermetabolic mass anterior to the rectum to represent serosal implant along the ventral surface of the rectum. 2. local recurrence within the LEFT abdominal wall at site prior colostomy. Intense hypermetabolic thickening of the rectus muscle at this site. 3. Two hypermetabolic hepatic metastasis. These results will be called to the ordering clinician or representative by the Radiologist Assistant, and communication documented in the PACS or [employer]. Electronically Signed   By: Suzy Bouchard M.D.   On: 12/21/2019 17:27    Labs:  CBC: Recent Labs    07/16/19 1255 08/13/19 1120 09/10/19 1336 11/26/19 1105  WBC 6.0 5.4 4.8 6.4  HGB 14.4 14.0 13.6 14.1  HCT 43.0 43.1 41.6 42.6  PLT 172 176 186 188    COAGS: Recent Labs    03/02/19 0630  INR 1.1    BMP: Recent Labs    07/16/19 1255 08/13/19 1120 09/10/19 1336 11/26/19 1105  NA 142 143 142 143  K 4.4 4.6 4.1 4.4  CL 106 104 105 107  CO2 28 29 28 26   GLUCOSE 99 99 106* 100*    BUN 25* 21 24* 26*  CALCIUM 9.1 9.4 9.2 9.3  CREATININE 1.29* 1.43* 1.50* 1.50*  GFRNONAA 55* 49* 46* 46*  GFRAA >60 56* 53* 53*    LIVER FUNCTION TESTS: Recent Labs    07/16/19 1255 08/13/19 1120 09/10/19 1336 11/26/19 1105  BILITOT  0.5 0.6 0.6 0.8  AST 17 19 15 20   ALT 16 16 14 15   ALKPHOS 80 79 85 74  PROT 7.0 6.9 6.9 6.9  ALBUMIN 4.3 4.3 4.2 4.1    TUMOR MARKERS: No results for input(s): AFPTM, CEA, CA199, CHROMGRNA in the last 8760 hours.  Assessment and Plan:  74 y.o. male with history of colon cancer diagnosed in 2019, status post surgery and chemotherapy.  Recent PET scan has revealed:   1. Unfortunately evidence for peritoneal metastasis. Intensely hypermetabolic nodules along the LEFT pericolic gutter. Favor hypermetabolic mass anterior to the rectum to represent serosal implant along the ventral surface of the rectum. 2. local recurrence within the LEFT abdominal wall at site prior colostomy. Intense hypermetabolic thickening of the rectus muscle at this site. 3. Two hypermetabolic hepatic metastas    He presents today for image guided biopsy of the left abdominal wall mass for further evaluation. He has a rising CEA level as well. Risks and benefits of procedure was discussed with the patient  including, but not limited to bleeding, infection, damage to adjacent structures or low yield requiring additional tests.  All of the questions were answered and there is agreement to proceed.  Consent signed and in chart.     Thank you for this interesting consult.  I greatly enjoyed meeting Dave Vaughn and look forward to participating in their care.  A copy of this report was sent to the requesting provider on this date.  Electronically Signed: D. Rowe Robert, PA-C 01/15/2020, 11:34 AM   I spent a total of 25 minutes  in face to face in clinical consultation, greater than 50% of which was counseling/coordinating care for image guided left abdominal  mass biopsy

## 2020-01-15 NOTE — Procedures (Signed)
Interventional Radiology Procedure Note  Procedure: US Guided Biopsy of left abdominal wall mass  Complications: None  Estimated Blood Loss: < 10 mL  Findings: 18 G core biopsy of left abdominal wall mass performed under US guidance.  Four core samples obtained and sent to Pathology.  Venetia Night. Kathlene Cote, M.D Pager:  [phone]

## 2020-01-16 LAB — SURGICAL PATHOLOGY

## 2020-01-17 ENCOUNTER — Encounter: Payer: Self-pay | Admitting: Hematology

## 2020-01-17 ENCOUNTER — Inpatient Hospital Stay: Payer: Commercial Managed Care - PPO

## 2020-01-17 ENCOUNTER — Inpatient Hospital Stay: Payer: Commercial Managed Care - PPO | Attending: Nurse Practitioner

## 2020-01-17 ENCOUNTER — Other Ambulatory Visit: Payer: Self-pay

## 2020-01-17 ENCOUNTER — Inpatient Hospital Stay (HOSPITAL_BASED_OUTPATIENT_CLINIC_OR_DEPARTMENT_OTHER): Payer: Commercial Managed Care - PPO | Admitting: Hematology

## 2020-01-17 VITALS — BP 121/81 | HR 79 | Temp 96.4°F | Resp 20 | Ht 70.0 in | Wt 225.5 lb

## 2020-01-17 DIAGNOSIS — Z5111 Encounter for antineoplastic chemotherapy: Secondary | ICD-10-CM | POA: Insufficient documentation

## 2020-01-17 DIAGNOSIS — Z7901 Long term (current) use of anticoagulants: Secondary | ICD-10-CM | POA: Insufficient documentation

## 2020-01-17 DIAGNOSIS — C186 Malignant neoplasm of descending colon: Secondary | ICD-10-CM

## 2020-01-17 DIAGNOSIS — I4891 Unspecified atrial fibrillation: Secondary | ICD-10-CM | POA: Diagnosis not present

## 2020-01-17 DIAGNOSIS — Z7189 Other specified counseling: Secondary | ICD-10-CM | POA: Diagnosis not present

## 2020-01-17 DIAGNOSIS — C786 Secondary malignant neoplasm of retroperitoneum and peritoneum: Secondary | ICD-10-CM | POA: Diagnosis not present

## 2020-01-17 DIAGNOSIS — N1831 Chronic kidney disease, stage 3a: Secondary | ICD-10-CM

## 2020-01-17 DIAGNOSIS — N183 Chronic kidney disease, stage 3 unspecified: Secondary | ICD-10-CM | POA: Insufficient documentation

## 2020-01-17 DIAGNOSIS — Z452 Encounter for adjustment and management of vascular access device: Secondary | ICD-10-CM | POA: Insufficient documentation

## 2020-01-17 DIAGNOSIS — Z95828 Presence of other vascular implants and grafts: Secondary | ICD-10-CM

## 2020-01-17 DIAGNOSIS — N400 Enlarged prostate without lower urinary tract symptoms: Secondary | ICD-10-CM | POA: Diagnosis not present

## 2020-01-17 DIAGNOSIS — C787 Secondary malignant neoplasm of liver and intrahepatic bile duct: Secondary | ICD-10-CM | POA: Diagnosis not present

## 2020-01-17 LAB — CMP (CANCER CENTER ONLY)
ALT: 11 U/L (ref 0–44)
AST: 15 U/L (ref 15–41)
Albumin: 3.9 g/dL (ref 3.5–5.0)
Alkaline Phosphatase: 78 U/L (ref 38–126)
Anion gap: 8 (ref 5–15)
BUN: 23 mg/dL (ref 8–23)
CO2: 27 mmol/L (ref 22–32)
Calcium: 9.6 mg/dL (ref 8.9–10.3)
Chloride: 106 mmol/L (ref 98–111)
Creatinine: 1.37 mg/dL — ABNORMAL HIGH (ref 0.61–1.24)
GFR, Est AFR Am: 59 mL/min — ABNORMAL LOW (ref >60)
GFR, Estimated: 51 mL/min — ABNORMAL LOW (ref >60)
Glucose, Bld: 97 mg/dL (ref 70–99)
Potassium: 4.2 mmol/L (ref 3.5–5.1)
Sodium: 141 mmol/L (ref 135–145)
Total Bilirubin: 0.5 mg/dL (ref 0.3–1.2)
Total Protein: 7.2 g/dL (ref 6.5–8.1)

## 2020-01-17 LAB — CBC WITH DIFFERENTIAL (CANCER CENTER ONLY)
Abs Immature Granulocytes: 0.06 10*3/uL (ref 0.00–0.07)
Basophils Absolute: 0 10*3/uL (ref 0.0–0.1)
Basophils Relative: 1 %
Eosinophils Absolute: 0.1 10*3/uL (ref 0.0–0.5)
Eosinophils Relative: 1 %
HCT: 38.3 % — ABNORMAL LOW (ref 39.0–52.0)
Hemoglobin: 12.7 g/dL — ABNORMAL LOW (ref 13.0–17.0)
Immature Granulocytes: 1 %
Lymphocytes Relative: 27 %
Lymphs Abs: 1.7 10*3/uL (ref 0.7–4.0)
MCH: 30.8 pg (ref 26.0–34.0)
MCHC: 33.2 g/dL (ref 30.0–36.0)
MCV: 93 fL (ref 80.0–100.0)
Monocytes Absolute: 0.4 10*3/uL (ref 0.1–1.0)
Monocytes Relative: 6 %
Neutro Abs: 4 10*3/uL (ref 1.7–7.7)
Neutrophils Relative %: 64 %
Platelet Count: 243 10*3/uL (ref 150–400)
RBC: 4.12 MIL/uL — ABNORMAL LOW (ref 4.22–5.81)
RDW: 13.2 % (ref 11.5–15.5)
WBC Count: 6.3 10*3/uL (ref 4.0–10.5)
nRBC: 0 % (ref 0.0–0.2)

## 2020-01-17 LAB — CEA (IN HOUSE-CHCC): CEA (CHCC-In House): 19.29 ng/mL — ABNORMAL HIGH (ref 0.00–5.00)

## 2020-01-17 MED ORDER — HEPARIN SOD (PORK) LOCK FLUSH 100 UNIT/ML IV SOLN
500.0000 [IU] | Freq: Once | INTRAVENOUS | Status: AC | PRN
  Administered 2020-01-17: 500 [IU]
  Filled 2020-01-17: qty 5

## 2020-01-17 MED ORDER — SODIUM CHLORIDE 0.9% FLUSH
10.0000 mL | INTRAVENOUS | Status: DC | PRN
  Administered 2020-01-17: 10 mL
  Filled 2020-01-17: qty 10

## 2020-01-17 NOTE — Progress Notes (Signed)
DISCONTINUE ON PATHWAY REGIMEN - Colorectal     A cycle is every 14 days:     Oxaliplatin      Leucovorin      5-Fluorouracil      5-Fluorouracil   **Always confirm dose/schedule in your pharmacy ordering system**  REASON: Disease Progression PRIOR TREATMENT: COS67: [id number] q14 Days x 6 Months TREATMENT RESPONSE: Unable to Evaluate  START ON PATHWAY REGIMEN - Colorectal     A cycle is every 14 days:     Bevacizumab-xxxx      Irinotecan      Leucovorin      Fluorouracil      Fluorouracil   **Always confirm dose/schedule in your pharmacy ordering system**  Patient Characteristics: Distant Metastases, Nonsurgical Candidate, KRAS/NRAS Mutation Positive/Unknown (BRAF V600 Wild-Type/Unknown), Standard Cytotoxic Therapy, First Line Standard Cytotoxic Therapy, Bevacizumab Eligible, PS = 0,1 Tumor Location: Colon Therapeutic Status: Distant Metastases Microsatellite/Mismatch Repair Status: MSS/pMMR BRAF Mutation Status: Awaiting Test Results KRAS/NRAS Mutation Status: Awaiting Test Results Standard Cytotoxic Line of Therapy: First Line Standard Cytotoxic Therapy ECOG Performance Status: 0 Bevacizumab Eligibility: Eligible Intent of Therapy: Non-Curative / Palliative Intent, Discussed with Patient

## 2020-01-19 ENCOUNTER — Other Ambulatory Visit: Payer: Self-pay | Admitting: Hematology

## 2020-01-21 ENCOUNTER — Telehealth: Payer: Self-pay | Admitting: Hematology

## 2020-01-21 NOTE — Telephone Encounter (Signed)
Scheduled per 8/12 los. Pt is aware of appt time and date. 

## 2020-01-22 NOTE — Progress Notes (Signed)
Pharmacist Chemotherapy Monitoring - Initial Assessment    Anticipated start date: 01/28/20  Regimen:  . Are orders appropriate based on the patient's diagnosis, regimen, and cycle? Yes . Does the plan date match the patient's scheduled date? Yes . Is the sequencing of drugs appropriate? Yes . Are the premedications appropriate for the patient's regimen? Yes . Prior Authorization for treatment is: Approved o If applicable, is the correct biosimilar selected based on the patient's insurance? no  Organ Function and Labs: [name] Are dose adjustments needed based on the patient's renal function, hepatic function, or hematologic function? No . Are appropriate labs ordered prior to the start of patient's treatment? Yes . Other organ system assessment, if indicated: N/A . The following baseline labs, if indicated, have been ordered: N/A  Dose Assessment: . Are the drug doses appropriate? Yes - MD dose reduced 1st dose d/t vacation planned 8/28. . Are the following correct: o Drug concentrations Yes o IV fluid compatible with drug Yes o Administration routes Yes o Timing of therapy Yes . If applicable, does the patient have documented access for treatment and/or plans for port-a-cath placement? yes . If applicable, have lifetime cumulative doses been properly documented and assessed? yes Lifetime Dose Tracking  . Oxaliplatin: 249.25 mg/m2 (540 mg) = 41.54 % of the maximum lifetime dose of 600 mg/m2  o   Toxicity Monitoring/Prevention: . The patient has the following take home antiemetics prescribed: Need to f/u - none ordered . The patient has the following take home medications prescribed: N/A . Medication allergies and previous infusion related reactions, if applicable, have been reviewed and addressed. Yes . The patient's current medication list has been assessed for drug-drug interactions with their chemotherapy regimen. no significant drug-drug interactions were identified on  review.  Order Review: . Are the treatment plan orders signed? Yes . Is the patient scheduled to see a provider prior to their treatment? Yes  I verify that I have reviewed each item in the above checklist and answered each question accordingly.   Kennith Center, Pharm.D., CPP 01/22/2020@12 :35 PM

## 2020-01-25 ENCOUNTER — Encounter (HOSPITAL_COMMUNITY): Payer: Self-pay | Admitting: Hematology

## 2020-01-27 NOTE — Progress Notes (Signed)
[place]   Telephone:([phone]) [phone] Fax:(336) [phone]   Clinic Follow up Note   Patient Care Team: [name], MD as PCP - General (Family Medicine) [name], MD as Consulting Physician (Cardiology) [name], MD as Consulting Physician (Gastroenterology) [name], MD as Consulting Physician (General Surgery) [name], MD as Consulting Physician (General Surgery) [name], MD as Consulting Physician (Urology) [name], MD as Consulting Physician (Medical Oncology) 01/28/2020  CHIEF COMPLAINT: F/u recurrent metastatic colon cancer   SUMMARY OF ONCOLOGIC HISTORY: Oncology History Overview Note  Cancer Staging Cancer of left colon [facility]) Staging form: Colon and Rectum, AJCC 8th Edition - Pathologic stage from 01/11/2018: Stage IIIB (pT3, pN1c, cM0) - Signed by [name], MD on 01/16/2018     Cancer of left colon ([place])  01/10/2018 Imaging   CT AP W Contrast 01/10/18  IMPRESSION: Irregular soft tissue density causing stricture of the mid descending colon likely the site of obstruction for the dilated small bowel. 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 No evidence of perforation.  Equivocal findings involving the appendix measuring 1.2 cm at the appendiceal tip with mucosal enhancement. No adjacent free fluid or inflammatory change. Findings are nonspecific, but can be seen with early acute appendicitis.  Mild prostatic enlargement. Increased density over the posterior bladder base likely due to the large prostatic impression although cannot completely exclude a bladder mass. Urology protocol CT or ultrasound may be helpful for better evaluation.  Mild cholelithiasis.  Stable 1.5 cm cystic structure over the lower pole right kidney likely slightly hyperdense cyst.  Diverticulosis of the colon.  Aortic Atherosclerosis (ICD10-I70.0).     01/11/2018 Cancer Staging   Staging form: Colon and Rectum, AJCC 8th  Edition - Pathologic stage from 01/11/2018: Stage IIIB (pT3, pN1c, cM0) - Signed by [name], MD on 01/16/2018   01/11/2018 Surgery   LEFT COLON RESECTION, TAKEDOWN SPLENIC FLEXURE, COLOSTOMY by Dr. [name]    01/11/2018 Procedure   Colonoscopy 01/11/18 by Dr. [name]  - Malignant completely obstructing tumor in the mid descending colon. Tattooed. - Diverticulosis in the sigmoid colon. - Non-bleeding internal hemorrhoids. - No specimens collected.   01/11/2018 Pathology Results   Diagnosis 01/11/18  1. Colon, segmental resection for tumor, descending colon - INVASIVE COLORECTAL ADENOCARCINOMA, 4 CM. - TUMOR EXTENDS INTO PERICOLONIC CONNECTIVE TISSUE. - TUMOR FOCALLY INVOLVES RADIAL MARGIN. - ONE MESENTERIC TUMOR DEPOSIT. - THIRTEEN BENIGN LYMPH NODES (0/13). 2. Colon, segmental resection, splenic flexure - BENIGN COLON. - NO EVIDENCE OF MALIGNANCY .   01/11/2018 Tumor Marker   Baseline CEA at 3.4   01/16/2018 Initial Diagnosis   Cancer of left colon ([place])   01/23/2018 Imaging   CT CHEST WO CONTRAST IMPRESSION: 1. No evidence for metastatic disease within the chest. 2. Small left pleural effusion with underlying opacities which may represent atelectasis. Right basilar atelectasis. 3. Few foci of gas within the upper abdomen in the omentum with surrounding fat stranding, likely postsurgical 4. Aortic Atherosclerosis (ICD10-I70.0).   03/08/2018 - 05/15/2018 Chemotherapy   adjuvant FOLOFX. Due to side effects of neuropahty Oxaliplatin was stopped after 3 cycles and chemo was stopped after 6 cycles. He declined completing 6 months of chemo treatment.     03/19/2018 Imaging   03/19/2018 CT AP IMPRESSION: 1. Interval partial left hemicolectomy and descending colostomy. 2. Heterogeneous soft tissue density along the left anterior renal fascia is most likely postoperative (favor fat necrosis). No well-defined fluid collection. 3. Mild left lower quadrant edema, new since  01/10/2018. This  could be postoperative. Superimposed sigmoid diverticulitis and/or cystitis cannot be excluded. 4. Subtle hyperenhancing nodule within the anterior bladder dome cannot be excluded. Consider nonemergent cystoscopy. When this is performed, recommend attention to the left ureterovesicular junction and distal left ureter to evaluate questionable soft tissue fullness. 5. Cholelithiasis. 6.  Aortic Atherosclerosis (ICD10-I70.0). 7. Prostatomegaly.   11/13/2018 Imaging   CT CAP WO Contrast 11/13/18  IMPRESSION: 1. Reversal of left lower quadrant colostomy with sigmoid colon anastomosis. No complicating features. No findings for residual or recurrent tumor or metastatic disease involving the chest, abdomen or pelvis without contrast. 2. No acute abdominal/pelvic findings. 3. Gallbladder sludge and gallstones but no findings for acute cholecystitis. 4. Stable anterior abdominal wall hernia. 5. The right testicle is in the right inguinal canal.   10/03/2019 Imaging   CT CAP WO contrast  IMPRESSION: 1. New rounded density interposed between the prostate gland and anterior upper rectal wall could represent adenopathy or local extension of anterior rectal tumor. 2. Marked prostatomegaly, prostate volume 150 cubic cm. 3. Other imaging findings of potential clinical significance: Aortic Atherosclerosis (ICD10-I70.0). Coronary atherosclerosis. Trace right pleural effusion. Suspected cholelithiasis. Nonobstructive left nephrolithiasis. Multilevel lumbar impingement. Bilateral mildly retracted testicles. Hypodense exophytic lesion of the right kidney, most likely to be a cyst.   11/02/2019 Procedure   colonoscopy on 11/02/2019 by Dr. [name] Plan showed normal digital rectal exam, 3 polyps in the rectum, descending colon and cecum, and a prior sigmoid: Anastomosis characterized by erythema.  He found an extrinsic nonobstructing medium-sized mass in the proximal rectum about 4 cm in length, no internal  rectal mass.   Diagnosis Surgical [P], colon, cecum, descending, rectal, polyp (3) - TUBULAR ADENOMA (TWO) - NO HIGH GRADE DYSPLASIA OR CARCINOMA. - COLONIC FRAGMENT WITH BENIGN LYMPHOID AGGREGATE.   12/07/2019 Imaging   MRI pelvis IMPRESSION: 1. Masslike area in the rectum suspicious for rectal neoplasm, likely T4b based on the appearance of soft tissue extending along the anterior peritoneal reflection and into the seminal vesicles. Correlation with recent colonoscopy results may be helpful. Area of anastomosis and other areas of the pelvis are not imaged on today's exam.   12/21/2019 PET scan   IMPRESSION: 1. Unfortunately evidence for peritoneal metastasis. Intensely hypermetabolic nodules along the LEFT pericolic gutter. Favor hypermetabolic mass anterior to the rectum to represent serosal implant along the ventral surface of the rectum. 2. local recurrence within the LEFT abdominal wall at site prior colostomy. Intense hypermetabolic thickening of the rectus muscle at this site. 3. Two hypermetabolic hepatic metastasis.   01/15/2020 Relapse/Recurrence   FINAL MICROSCOPIC DIAGNOSIS:   A. SOFT TISSUE, LEFT ABDOMINAL WALL, BIOPSY:  - Adenocarcinoma.  - See comment.   COMMENT:   The morphology is consistent with metastatic colorectal adenocarcinoma.    01/28/2020 -  Chemotherapy   PENDING First line FOLFIRI q2weeks starting 01/28/20   01/28/2020 -  Chemotherapy   The patient had dexamethasone (DECADRON) 4 MG tablet, 8 mg, Oral, Daily, 0 of 1 cycle, Start date: --, End date: -- palonosetron (ALOXI) injection 0.25 mg, 0.25 mg, Intravenous,  Once, 0 of 4 cycles irinotecan (CAMPTOSAR) 340 mg in sodium chloride 0.9 % 500 mL chemo infusion, 150 mg/m2 = 340 mg (100 % of original dose 150 mg/m2), Intravenous,  Once, 0 of 4 cycles Dose modification: 150 mg/m2 (original dose 150 mg/m2, Cycle 1, Reason: Provider Judgment) fluorouracil (ADRUCIL) 4,500 mg in sodium chloride 0.9 % 60 mL  chemo infusion, 2,000 mg/m2 = 4,500 mg (100 %  of original dose 2,000 mg/m2), Intravenous, 1 Day/Dose, 0 of 4 cycles Dose modification: 2,000 mg/m2 (original dose 2,000 mg/m2, Cycle 1, Reason: Provider Judgment) bevacizumab-awwb (MVASI) 500 mg in sodium chloride 0.9 % 100 mL chemo infusion, 5 mg/kg = 500 mg, Intravenous,  Once, 0 of 2 cycles leucovorin 900 mg in sodium chloride 0.9 % 250 mL infusion, 400 mg/m2 = 900 mg, Intravenous,  Once, 0 of 4 cycles  for chemotherapy treatment.      CURRENT THERAPY:  PENDING First line FOLFIRI q2weeks starting 01/28/20, add bevacizumab/biosimilar with cycle 2  INTERVAL HISTORY: Dave Vaughn returns for f/u and to start chemo as scheduled.  He feels well in general, continues to work.  Appetite is normal.  Has occasional bilateral upper abdominal pain not requiring medication.  Denies nausea, vomiting.  Takes stool softener for chronic constipation.  Denies rectal bleeding.  Continues Eliquis for A. fib.  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  No recent fever, chills, cough, chest pain, dyspnea, leg edema.   MEDICAL HISTORY:  Past Medical History:  Diagnosis Date  . Adenocarcinoma, colon ([place]) dx'd 01/2018  . Anemia    taking iron supplements  . Anxiety   . Atrial fibrillation with RVR ([place])   . Colonic obstruction ([place]) 01/10/2018  . Depression   . Diverticulitis   . Dysrhythmia    afib  . History of kidney stones     SURGICAL HISTORY: Past Surgical History:  Procedure Laterality Date  . ANKLE SURGERY Left    when he was in college  . COLON RESECTION N/A 01/11/2018   Procedure: LEFT COLON RESECTION, TAKEDOWN SPLENIC FLEXURE, COLOSTOMY;  Surgeon: [name], MD;  Location: [facility];  Service: General;  Laterality: N/A;  . COLONOSCOPY  01/11/2018   Procedure: COLONOSCOPY;  Surgeon: Jackquline Denmark, MD;  Location: [facility];  Service: Endoscopy;;  . COLONOSCOPY  05/10/2018  . colonscopy  05/10/2018  . HERNIA  REPAIR    . HERNIA REPAIR  03/02/2019   EXPLORATORY LAPAROTOMY (N/A Abdomen)  . INCISIONAL HERNIA REPAIR N/A 03/02/2019   Procedure: INCISIONAL HERNIA REPAIR , RECTORECTUS VS TAR HERNIA REPAIR;  Surgeon: Ralene Ok, MD;  Location: [place];  Service: General;  Laterality: N/A;  . INSERTION OF MESH N/A 03/02/2019   Procedure: Insertion Of Mesh;  Surgeon: Ralene Ok, MD;  Location: [place];  Service: General;  Laterality: N/A;  . LAPAROTOMY N/A 03/02/2019   Procedure: EXPLORATORY LAPAROTOMY;  Surgeon: Ralene Ok, MD;  Location: [place];  Service: General;  Laterality: N/A;  . LYSIS OF ADHESION N/A 06/12/2018   Procedure: LYSIS OF ADHESIONS;  Surgeon: [name], MD;  Location: [facility];  Service: General;  Laterality: N/A;  . LYSIS OF ADHESION N/A 03/02/2019   Procedure: Lysis Of Adhesion;  Surgeon: Ralene Ok, MD;  Location: [place];  Service: General;  Laterality: N/A;  . PORTACATH PLACEMENT Right 03/07/2018   Procedure: INSERTION PORT-A-CATH RIGHT SUBCLAVIAN;  Surgeon: [name], MD;  Location: [place];  Service: General;  Laterality: Right;  . PROCTOSCOPY N/A 06/12/2018   Procedure: RIGID PROCTOSCOPY;  Surgeon: [name], MD;  Location: [facility];  Service: General;  Laterality: N/A;  . thumb surgery   2018   cyst removal    I have reviewed the social history and family history with the patient and they are unchanged from previous note.  ALLERGIES:  has No Known Allergies.  MEDICATIONS:  Current Outpatient Medications  Medication Sig Dispense Refill  .  apixaban (ELIQUIS) 5 MG TABS tablet Take 1 tablet (5 mg total) by mouth 2 (two) times daily. 60 tablet 0  . b complex vitamins tablet Take 1 tablet by mouth daily.    [name] diltiazem (CARDIZEM CD) 240 MG 24 hr capsule Take 240 mg by mouth daily.    [name] docusate sodium (COLACE) 100 MG capsule Take 100 mg by mouth 2 (two) times daily.    . Multiple Vitamins-Iron (MULTIVITAMIN/IRON PO) Take 1 tablet by mouth daily.     .  tamsulosin (FLOMAX) 0.4 MG CAPS capsule Take 0.4 mg by mouth 2 (two) times daily.      No current facility-administered medications for this visit.    PHYSICAL EXAMINATION: ECOG PERFORMANCE STATUS: 0 - Asymptomatic  Vitals:   01/28/20 1013  BP: 140/84  Pulse: 87  Resp: 18  Temp: (!) 97.5 F (36.4 C)  SpO2: 99%   Filed Weights   01/28/20 1013  Weight: 224 lb 12.8 oz (102 kg)    GENERAL:alert, no distress and comfortable SKIN: No rash to exposed skin EYES: sclera clear NECK: Without mass LUNGS: clear with normal breathing effort HEART: regular rate & rhythm, no lower extremity edema ABDOMEN:abdomen soft, non-tender and normal bowel sounds.  Palpable mass in the left upper abdomen NEURO: alert & oriented x 3 with fluent speech, normal gait PAC without erythema  LABORATORY DATA:  I have reviewed the data as listed CBC Latest Ref Rng & Units 01/28/2020 01/17/2020 01/15/2020  WBC 4.0 - 10.5 K/uL 6.2 6.3 5.4  Hemoglobin 13.0 - 17.0 g/dL 13.1 12.7(L) 12.1(L)  Hematocrit 39 - 52 % 39.8 38.3(L) 36.7(L)  Platelets 150 - 400 K/uL 263 243 217     CMP Latest Ref Rng & Units 01/28/2020 01/17/2020 01/15/2020  Glucose 70 - 99 mg/dL 88 97 103(H)  BUN 8 - 23 mg/dL 23 23 34(H)  Creatinine 0.61 - 1.24 mg/dL 1.36(H) 1.37(H) 1.23  Sodium 135 - 145 mmol/L 142 141 139  Potassium 3.5 - 5.1 mmol/L 4.2 4.2 3.8  Chloride 98 - 111 mmol/L 105 106 104  CO2 22 - 32 mmol/L 27 27 26   Calcium 8.9 - 10.3 mg/dL 9.8 9.6 8.7(L)  Total Protein 6.5 - 8.1 g/dL 7.1 7.2 6.8  Total Bilirubin 0.3 - 1.2 mg/dL 0.5 0.5 0.5  Alkaline Phos 38 - 126 U/L 74 78 76  AST 15 - 41 U/L 21 15 20   ALT 0 - 44 U/L 7 11 17       RADIOGRAPHIC STUDIES: I have personally reviewed the radiological images as listed and agreed with the findings in the report. No results found.   ASSESSMENT & PLAN: Dave Vaughn a 74 y.o.malewith    1. Cancer of left colon,adenocarcinoma, stage IIIB(pT3N1cM0), MSI-stable,  KRAS+ -Diagnosed in 01/2018. Treated with surgery andadjuvantFOLFOX. Due to side effects Oxaliplatin was stopped after 3 cycles and chemo was stopped after 6 cycles. He declined completing 6 months of chemo treatment. -He underwent hernia repair in 02/2019. He recovered well.  -Surveillance CT CAP from4/28/21shows no definitive evidence of metastasis, except a soft tissue density in between prostate and rectum, indeterminate.  -colonoscopy in December 2019 showed mucosal inflammation in the rectum, biopsy was negative.  -Rising CEA -Repeat colonoscopy on 11/02/2019 by Dr. [name] Plan showed normal digital rectal exam, 3 polyps in the rectum, descending colon and cecum, and a prior sigmoid: Anastomosis characterized by erythema.  He found an extrinsic nonobstructing medium-sized mass in the proximal rectum about 4 cm in length, no internal  rectal mass. -He underwent pelvic MRI on 12/06/2019 which was reviewed in our tumor board, showing a masslike area in the rectum suspicious for rectal neoplasm, likely T4b based on the appearance of soft tissue extending along the anterior peritoneal reflection and into the seminal vesicles.  -His PET from 12/21/2019 shows peritoneal metastasis, 2 hypermetabolic hepatic metastases, and local recurrence within the left abdominal wall.  His ultrasound biopsy of the left abdominal wall from 01/15/2020 shows metastatic colorectal adenocarcinoma. -It was discussed that this is recurrent metastatic stage IV colon cancer, not resectable but still treatable.  Given his good nutrition and PS he will likely tolerate treatment well.  The goal is palliative -Plan to proceed with first-line FOLFIRI starting 01/28/2020 -[facility] showed Kras (+) positive, he is not a candidate for EGFR inhibitor.  Plan to add bevacizumab with cycle 2  2.  BPH  -Followed by urologist Dr. Lovena Neighbours -Pelvic MRI on 7/1 shows marked prostatomegaly with signs of BPH extending into the bladder base -Denies  new or worsening urinary symptoms  3. Atrial Fibrillation -continue Eliquis. Rate controlled.  He can stop Eliquis periodically for bleeding hemorrhoids -Continue follow-up with cardiology  4.CKD stage III -he has developed mild increased Cr since he started chemo, EGFR around 40-50's -I encouraged him to control his blood pressure, cholesterol and BG. I also encouraged him to drink plenty of water. -Will avoid NSAIDs and CT IV contrast if GFR low  Disposition: Dave Vaughn appears stable.  He has intermittent upper abdominal discomfort.  There is a palpable mass in the left upper abdomen.  He does not require pain medication at this time.  Otherwise he is doing well with excellent performance status.  CBC is normal, CMP with SCr 1.36 which is stable for him.  I will follow up on the pending CEA from today.  He was previously on synthroid 125 mcg but thinks his cardiologist had him stop it, does not know why he was on synthroid in the first place. Will check TSH next visit, was normal in 2019.   I reviewed his treatment plan with FOLFIRI including potential side effects and symptom management.  I reviewed his [facility] report which shows K-ras mutation, he is not a candidate for EGFR inhibitor.  The plan is to add bevacizumab/biosimilar with cycle 2.  I reviewed the rationale for treatment, potential toxicities including hypertension, proteinuria, wound healing issues, GI perforation/bleeding, and thrombosis. The goal of treatment is palliative. I answered his questions about treatment and his prognosis to the best of my ability and knowledge. He agrees to proceed.  He will proceed with cycle 1 FOLFIRI today as planned.  He will return for pump d/c on 8/25.  He will return for follow-up and cycle 2 in 2 weeks.  Orders Placed This Encounter  Procedures  . TSH    Standing Status:   Future    Standing Expiration Date:   01/27/2021   All questions were answered. The patient knows to  call the clinic with any problems, questions or concerns. No barriers to learning were detected. Total encounter time was 30 minutes.     Alla Feeling, NP 01/28/20

## 2020-01-28 ENCOUNTER — Other Ambulatory Visit: Payer: Self-pay

## 2020-01-28 ENCOUNTER — Encounter: Payer: Self-pay | Admitting: Nurse Practitioner

## 2020-01-28 ENCOUNTER — Inpatient Hospital Stay (HOSPITAL_BASED_OUTPATIENT_CLINIC_OR_DEPARTMENT_OTHER): Payer: Commercial Managed Care - PPO | Admitting: Nurse Practitioner

## 2020-01-28 ENCOUNTER — Inpatient Hospital Stay: Payer: Commercial Managed Care - PPO

## 2020-01-28 VITALS — BP 140/84 | HR 87 | Temp 97.5°F | Resp 18 | Ht 70.0 in | Wt 224.8 lb

## 2020-01-28 DIAGNOSIS — Z5111 Encounter for antineoplastic chemotherapy: Secondary | ICD-10-CM | POA: Diagnosis not present

## 2020-01-28 DIAGNOSIS — Z95828 Presence of other vascular implants and grafts: Secondary | ICD-10-CM

## 2020-01-28 DIAGNOSIS — C186 Malignant neoplasm of descending colon: Secondary | ICD-10-CM

## 2020-01-28 DIAGNOSIS — Z7189 Other specified counseling: Secondary | ICD-10-CM

## 2020-01-28 LAB — CBC WITH DIFFERENTIAL (CANCER CENTER ONLY)
Abs Immature Granulocytes: 0.02 10*3/uL (ref 0.00–0.07)
Basophils Absolute: 0 10*3/uL (ref 0.0–0.1)
Basophils Relative: 1 %
Eosinophils Absolute: 0.1 10*3/uL (ref 0.0–0.5)
Eosinophils Relative: 2 %
HCT: 39.8 % (ref 39.0–52.0)
Hemoglobin: 13.1 g/dL (ref 13.0–17.0)
Immature Granulocytes: 0 %
Lymphocytes Relative: 31 %
Lymphs Abs: 1.9 10*3/uL (ref 0.7–4.0)
MCH: 30.6 pg (ref 26.0–34.0)
MCHC: 32.9 g/dL (ref 30.0–36.0)
MCV: 93 fL (ref 80.0–100.0)
Monocytes Absolute: 0.5 10*3/uL (ref 0.1–1.0)
Monocytes Relative: 9 %
Neutro Abs: 3.6 10*3/uL (ref 1.7–7.7)
Neutrophils Relative %: 57 %
Platelet Count: 263 10*3/uL (ref 150–400)
RBC: 4.28 MIL/uL (ref 4.22–5.81)
RDW: 13.2 % (ref 11.5–15.5)
WBC Count: 6.2 10*3/uL (ref 4.0–10.5)
nRBC: 0 % (ref 0.0–0.2)

## 2020-01-28 LAB — CMP (CANCER CENTER ONLY)
ALT: 7 U/L (ref 0–44)
AST: 21 U/L (ref 15–41)
Albumin: 4 g/dL (ref 3.5–5.0)
Alkaline Phosphatase: 74 U/L (ref 38–126)
Anion gap: 10 (ref 5–15)
BUN: 23 mg/dL (ref 8–23)
CO2: 27 mmol/L (ref 22–32)
Calcium: 9.8 mg/dL (ref 8.9–10.3)
Chloride: 105 mmol/L (ref 98–111)
Creatinine: 1.36 mg/dL — ABNORMAL HIGH (ref 0.61–1.24)
GFR, Est AFR Am: 59 mL/min — ABNORMAL LOW (ref >60)
GFR, Estimated: 51 mL/min — ABNORMAL LOW (ref >60)
Glucose, Bld: 88 mg/dL (ref 70–99)
Potassium: 4.2 mmol/L (ref 3.5–5.1)
Sodium: 142 mmol/L (ref 135–145)
Total Bilirubin: 0.5 mg/dL (ref 0.3–1.2)
Total Protein: 7.1 g/dL (ref 6.5–8.1)

## 2020-01-28 LAB — CEA (IN HOUSE-CHCC): CEA (CHCC-In House): 24.93 ng/mL — ABNORMAL HIGH (ref 0.00–5.00)

## 2020-01-28 MED ORDER — SODIUM CHLORIDE 0.9% FLUSH
10.0000 mL | INTRAVENOUS | Status: DC | PRN
  Administered 2020-01-28: 10 mL
  Filled 2020-01-28: qty 10

## 2020-01-28 MED ORDER — SODIUM CHLORIDE 0.9 % IV SOLN
150.0000 mg/m2 | Freq: Once | INTRAVENOUS | Status: AC
  Administered 2020-01-28: 340 mg via INTRAVENOUS
  Filled 2020-01-28: qty 15

## 2020-01-28 MED ORDER — ATROPINE SULFATE 0.4 MG/ML IJ SOLN
INTRAMUSCULAR | Status: AC
  Filled 2020-01-28: qty 1

## 2020-01-28 MED ORDER — SODIUM CHLORIDE 0.9 % IV SOLN
400.0000 mg/m2 | Freq: Once | INTRAVENOUS | Status: AC
  Administered 2020-01-28: 900 mg via INTRAVENOUS
  Filled 2020-01-28: qty 45

## 2020-01-28 MED ORDER — PALONOSETRON HCL INJECTION 0.25 MG/5ML
0.2500 mg | Freq: Once | INTRAVENOUS | Status: AC
  Administered 2020-01-28: 0.25 mg via INTRAVENOUS

## 2020-01-28 MED ORDER — SODIUM CHLORIDE 0.9 % IV SOLN
Freq: Once | INTRAVENOUS | Status: AC
  Filled 2020-01-28: qty 250

## 2020-01-28 MED ORDER — PALONOSETRON HCL INJECTION 0.25 MG/5ML
INTRAVENOUS | Status: AC
  Filled 2020-01-28: qty 5

## 2020-01-28 MED ORDER — SODIUM CHLORIDE 0.9 % IV SOLN
10.0000 mg | Freq: Once | INTRAVENOUS | Status: AC
  Administered 2020-01-28: 10 mg via INTRAVENOUS
  Filled 2020-01-28: qty 10

## 2020-01-28 MED ORDER — SODIUM CHLORIDE 0.9 % IV SOLN
2000.0000 mg/m2 | INTRAVENOUS | Status: DC
  Administered 2020-01-28: 4500 mg via INTRAVENOUS
  Filled 2020-01-28: qty 90

## 2020-01-28 MED ORDER — ATROPINE SULFATE 1 MG/ML IJ SOLN
INTRAMUSCULAR | Status: AC
  Filled 2020-01-28: qty 1

## 2020-01-28 MED ORDER — PROCHLORPERAZINE MALEATE 10 MG PO TABS: 10.0000 mg | ORAL_TABLET | Freq: Four times a day (QID) | ORAL | 0 refills | Status: DC | PRN

## 2020-01-28 MED ORDER — ATROPINE SULFATE 1 MG/ML IJ SOLN
0.5000 mg | Freq: Once | INTRAMUSCULAR | Status: AC | PRN
  Administered 2020-01-28: 0.5 mg via INTRAVENOUS

## 2020-01-28 NOTE — Patient Instructions (Signed)
[facility] Discharge Instructions for Patients Receiving Chemotherapy  Today you received the following chemotherapy agents: Irinotecan, Leucovorin, and Fluorouracil (5FU).   To help prevent nausea and vomiting after your treatment, we encourage you to take your nausea medication as directed.   If you develop nausea and vomiting that is not controlled by your nausea medication, call the clinic.   BELOW ARE SYMPTOMS THAT SHOULD BE REPORTED IMMEDIATELY:  *FEVER GREATER THAN 100.5 F  *CHILLS WITH OR WITHOUT FEVER  NAUSEA AND VOMITING THAT IS NOT CONTROLLED WITH YOUR NAUSEA MEDICATION  *UNUSUAL SHORTNESS OF BREATH  *UNUSUAL BRUISING OR BLEEDING  TENDERNESS IN MOUTH AND THROAT WITH OR WITHOUT PRESENCE OF ULCERS  *URINARY PROBLEMS  *BOWEL PROBLEMS  UNUSUAL RASH Items with * indicate a potential emergency and should be followed up as soon as possible.  Feel free to call the clinic should you have any questions or concerns. The clinic phone number is [phone].  Please show the [facility] at check-in to the Emergency Department and triage nurse.  Leucovorin injection What is this medicine? LEUCOVORIN (loo koe VOR in) is used to prevent or treat the harmful effects of some medicines. This medicine is used to treat anemia caused by a low amount of folic acid in the body. It is also used with 5-fluorouracil (5-FU) to treat colon cancer. This medicine may be used for other purposes; ask your health care provider or pharmacist if you have questions. What should I tell my health care provider before I take this medicine? They need to know if you have any of these conditions:  anemia from low levels of vitamin B-12 in the blood  an unusual or allergic reaction to leucovorin, folic acid, other medicines, foods, dyes, or preservatives  pregnant or trying to get pregnant  breast-feeding How should I use this medicine? This medicine is for injection into a  muscle or into a vein. It is given by a health care professional in a hospital or clinic setting. Talk to your pediatrician regarding the use of this medicine in children. Special care may be needed. Overdosage: If you think you have taken too much of this medicine contact a poison control center or emergency room at once. NOTE: This medicine is only for you. Do not share this medicine with others. What if I miss a dose? This does not apply. What may interact with this medicine?  capecitabine  fluorouracil  phenobarbital  phenytoin  primidone  trimethoprim-sulfamethoxazole This list may not describe all possible interactions. Give your health care provider a list of all the medicines, herbs, non-prescription drugs, or dietary supplements you use. Also tell them if you smoke, drink alcohol, or use illegal drugs. Some items may interact with your medicine. What should I watch for while using this medicine? Your condition will be monitored carefully while you are receiving this medicine. This medicine may increase the side effects of 5-fluorouracil, 5-FU. Tell your doctor or health care professional if you have diarrhea or mouth sores that do not get better or that get worse. What side effects may I notice from receiving this medicine? Side effects that you should report to your doctor or health care professional as soon as possible:  allergic reactions like skin rash, itching or hives, swelling of the face, lips, or tongue  breathing problems  fever, infection  mouth sores  unusual bleeding or bruising  unusually weak or tired Side effects that usually do not require medical attention (report to  your doctor or health care professional if they continue or are bothersome):  constipation or diarrhea  loss of appetite  nausea, vomiting This list may not describe all possible side effects. Call your doctor for medical advice about side effects. You may report side effects to FDA  at 1-800-FDA-1088. Where should I keep my medicine? This drug is given in a hospital or clinic and will not be stored at home. NOTE: This sheet is a summary. It may not cover all possible information. If you have questions about this medicine, talk to your doctor, pharmacist, or health care provider.  2020 Elsevier/Gold Standard (2007-11-28 16:50:29)  Irinotecan injection What is this medicine? IRINOTECAN (ir in oh TEE kan ) is a chemotherapy drug. It is used to treat colon and rectal cancer. This medicine may be used for other purposes; ask your health care provider or pharmacist if you have questions. COMMON BRAND NAME(S): Camptosar What should I tell my health care provider before I take this medicine? They need to know if you have any of these conditions:  dehydration  diarrhea  infection (especially a virus infection such as chickenpox, cold sores, or herpes)  liver disease  low blood counts, like low white cell, platelet, or red cell counts  low levels of calcium, magnesium, or potassium in the blood  recent or ongoing radiation therapy  an unusual or allergic reaction to irinotecan, other medicines, foods, dyes, or preservatives  pregnant or trying to get pregnant  breast-feeding How should I use this medicine? This drug is given as an infusion into a vein. It is administered in a hospital or clinic by a specially trained health care professional. Talk to your pediatrician regarding the use of this medicine in children. Special care may be needed. Overdosage: If you think you have taken too much of this medicine contact a poison control center or emergency room at once. NOTE: This medicine is only for you. Do not share this medicine with others. What if I miss a dose? It is important not to miss your dose. Call your doctor or health care professional if you are unable to keep an appointment. What may interact with this medicine? This medicine may interact with the  following medications:  antiviral medicines for HIV or AIDS  certain antibiotics like rifampin or rifabutin  certain medicines for fungal infections like itraconazole, ketoconazole, posaconazole, and voriconazole  certain medicines for seizures like carbamazepine, phenobarbital, phenotoin  clarithromycin  gemfibrozil  nefazodone  St. John's Wort This list may not describe all possible interactions. Give your health care provider a list of all the medicines, herbs, non-prescription drugs, or dietary supplements you use. Also tell them if you smoke, drink alcohol, or use illegal drugs. Some items may interact with your medicine. What should I watch for while using this medicine? Your condition will be monitored carefully while you are receiving this medicine. You will need important blood work done while you are taking this medicine. This drug may make you feel generally unwell. This is not uncommon, as chemotherapy can affect healthy cells as well as cancer cells. Report any side effects. Continue your course of treatment even though you feel ill unless your doctor tells you to stop. In some cases, you may be given additional medicines to help with side effects. Follow all directions for their use. You may get drowsy or dizzy. Do not drive, use machinery, or do anything that needs mental alertness until you know how this medicine affects you. Do not  stand or sit up quickly, especially if you are an older patient. This reduces the risk of dizzy or fainting spells. Call your health care professional for advice if you get a fever, chills, or sore throat, or other symptoms of a cold or flu. Do not treat yourself. This medicine decreases your body's ability to fight infections. Try to avoid being around people who are sick. Avoid taking products that contain aspirin, acetaminophen, ibuprofen, naproxen, or ketoprofen unless instructed by your doctor. These medicines may hide a fever. This medicine  may increase your risk to bruise or bleed. Call your doctor or health care professional if you notice any unusual bleeding. Be careful brushing and flossing your teeth or using a toothpick because you may get an infection or bleed more easily. If you have any dental work done, tell your dentist you are receiving this medicine. Do not become pregnant while taking this medicine or for 6 months after stopping it. Women should inform their health care professional if they wish to become pregnant or think they might be pregnant. Men should not father a child while taking this medicine and for 3 months after stopping it. There is potential for serious side effects to an unborn child. Talk to your health care professional for more information. Do not breast-feed an infant while taking this medicine or for 7 days after stopping it. This medicine has caused ovarian failure in some women. This medicine may make it more difficult to get pregnant. Talk to your health care professional if you are concerned about your fertility. This medicine has caused decreased sperm counts in some men. This may make it more difficult to father a child. Talk to your health care professional if you are concerned about your fertility. What side effects may I notice from receiving this medicine? Side effects that you should report to your doctor or health care professional as soon as possible:  allergic reactions like skin rash, itching or hives, swelling of the face, lips, or tongue  chest pain  diarrhea  flushing, runny nose, sweating during infusion  low blood counts - this medicine may decrease the number of white blood cells, red blood cells and platelets. You may be at increased risk for infections and bleeding.  nausea, vomiting  pain, swelling, warmth in the leg  signs of decreased platelets or bleeding - bruising, pinpoint red spots on the skin, black, tarry stools, blood in the urine  signs of infection - fever  or chills, cough, sore throat, pain or difficulty passing urine  signs of decreased red blood cells - unusually weak or tired, fainting spells, lightheadedness Side effects that usually do not require medical attention (report to your doctor or health care professional if they continue or are bothersome):  constipation  hair loss  headache  loss of appetite  mouth sores  stomach pain This list may not describe all possible side effects. Call your doctor for medical advice about side effects. You may report side effects to FDA at 1-800-FDA-1088. Where should I keep my medicine? This drug is given in a hospital or clinic and will not be stored at home. NOTE: This sheet is a summary. It may not cover all possible information. If you have questions about this medicine, talk to your doctor, pharmacist, or health care provider.  2020 Elsevier/Gold Standard (2018-07-14 10:09:17) Fluorouracil, 5-FU injection What is this medicine? FLUOROURACIL, 5-FU (flure oh YOOR a sil) is a chemotherapy drug. It slows the growth of cancer cells. This  medicine is used to treat many types of cancer like breast cancer, colon or rectal cancer, pancreatic cancer, and stomach cancer. This medicine may be used for other purposes; ask your health care provider or pharmacist if you have questions. COMMON BRAND NAME(S): Adrucil What should I tell my health care provider before I take this medicine? They need to know if you have any of these conditions:  blood disorders  dihydropyrimidine dehydrogenase (DPD) deficiency  infection (especially a virus infection such as chickenpox, cold sores, or herpes)  kidney disease  liver disease  malnourished, poor nutrition  recent or ongoing radiation therapy  an unusual or allergic reaction to fluorouracil, other chemotherapy, other medicines, foods, dyes, or preservatives  pregnant or trying to get pregnant  breast-feeding How should I use this medicine? This  drug is given as an infusion or injection into a vein. It is administered in a hospital or clinic by a specially trained health care professional. Talk to your pediatrician regarding the use of this medicine in children. Special care may be needed. Overdosage: If you think you have taken too much of this medicine contact a poison control center or emergency room at once. NOTE: This medicine is only for you. Do not share this medicine with others. What if I miss a dose? It is important not to miss your dose. Call your doctor or health care professional if you are unable to keep an appointment. What may interact with this medicine?  allopurinol  cimetidine  dapsone  digoxin  hydroxyurea  leucovorin  levamisole  medicines for seizures like ethotoin, fosphenytoin, phenytoin  medicines to increase blood counts like filgrastim, pegfilgrastim, sargramostim  medicines that treat or prevent blood clots like warfarin, enoxaparin, and dalteparin  methotrexate  metronidazole  pyrimethamine  some other chemotherapy drugs like busulfan, cisplatin, estramustine, vinblastine  trimethoprim  trimetrexate  vaccines Talk to your doctor or health care professional before taking any of these medicines:  acetaminophen  aspirin  ibuprofen  ketoprofen  naproxen This list may not describe all possible interactions. Give your health care provider a list of all the medicines, herbs, non-prescription drugs, or dietary supplements you use. Also tell them if you smoke, drink alcohol, or use illegal drugs. Some items may interact with your medicine. What should I watch for while using this medicine? Visit your doctor for checks on your progress. This drug may make you feel generally unwell. This is not uncommon, as chemotherapy can affect healthy cells as well as cancer cells. Report any side effects. Continue your course of treatment even though you feel ill unless your doctor tells you to  stop. In some cases, you may be given additional medicines to help with side effects. Follow all directions for their use. Call your doctor or health care professional for advice if you get a fever, chills or sore throat, or other symptoms of a cold or flu. Do not treat yourself. This drug decreases your body's ability to fight infections. Try to avoid being around people who are sick. This medicine may increase your risk to bruise or bleed. Call your doctor or health care professional if you notice any unusual bleeding. Be careful brushing and flossing your teeth or using a toothpick because you may get an infection or bleed more easily. If you have any dental work done, tell your dentist you are receiving this medicine. Avoid taking products that contain aspirin, acetaminophen, ibuprofen, naproxen, or ketoprofen unless instructed by your doctor. These medicines may hide a fever.  Do not become pregnant while taking this medicine. Women should inform their doctor if they wish to become pregnant or think they might be pregnant. There is a potential for serious side effects to an unborn child. Talk to your health care professional or pharmacist for more information. Do not breast-feed an infant while taking this medicine. Men should inform their doctor if they wish to father a child. This medicine may lower sperm counts. Do not treat diarrhea with over the counter products. Contact your doctor if you have diarrhea that lasts more than 2 days or if it is severe and watery. This medicine can make you more sensitive to the sun. Keep out of the sun. If you cannot avoid being in the sun, wear protective clothing and use sunscreen. Do not use sun lamps or tanning beds/booths. What side effects may I notice from receiving this medicine? Side effects that you should report to your doctor or health care professional as soon as possible:  allergic reactions like skin rash, itching or hives, swelling of the face,  lips, or tongue  low blood counts - this medicine may decrease the number of white blood cells, red blood cells and platelets. You may be at increased risk for infections and bleeding.  signs of infection - fever or chills, cough, sore throat, pain or difficulty passing urine  signs of decreased platelets or bleeding - bruising, pinpoint red spots on the skin, black, tarry stools, blood in the urine  signs of decreased red blood cells - unusually weak or tired, fainting spells, lightheadedness  breathing problems  changes in vision  chest pain  mouth sores  nausea and vomiting  pain, swelling, redness at site where injected  pain, tingling, numbness in the hands or feet  redness, swelling, or sores on hands or feet  stomach pain  unusual bleeding Side effects that usually do not require medical attention (report to your doctor or health care professional if they continue or are bothersome):  changes in finger or toe nails  diarrhea  dry or itchy skin  hair loss  headache  loss of appetite  sensitivity of eyes to the light  stomach upset  unusually teary eyes This list may not describe all possible side effects. Call your doctor for medical advice about side effects. You may report side effects to FDA at 1-800-FDA-1088. Where should I keep my medicine? This drug is given in a hospital or clinic and will not be stored at home. NOTE: This sheet is a summary. It may not cover all possible information. If you have questions about this medicine, talk to your doctor, pharmacist, or health care provider.  2020 Elsevier/Gold Standard (2007-09-27 13:53:16)

## 2020-01-28 NOTE — Progress Notes (Signed)
The following biosimilar Mvasi (bevacizumab-awwb) has been selected for use in this patient.  Kennith Center, Pharm.D., CPP 01/28/2020@11 :18 AM

## 2020-01-29 ENCOUNTER — Telehealth: Payer: Self-pay | Admitting: Nurse Practitioner

## 2020-01-29 NOTE — Telephone Encounter (Signed)
Scheduled per 8/23 los. Pt is aware of appt times and dates.

## 2020-01-30 ENCOUNTER — Other Ambulatory Visit: Payer: Self-pay

## 2020-01-30 ENCOUNTER — Inpatient Hospital Stay: Payer: Commercial Managed Care - PPO

## 2020-01-30 ENCOUNTER — Telehealth: Payer: Self-pay | Admitting: *Deleted

## 2020-01-30 VITALS — BP 120/80 | HR 57 | Temp 98.4°F | Resp 18

## 2020-01-30 DIAGNOSIS — Z5111 Encounter for antineoplastic chemotherapy: Secondary | ICD-10-CM | POA: Diagnosis not present

## 2020-01-30 DIAGNOSIS — Z7189 Other specified counseling: Secondary | ICD-10-CM

## 2020-01-30 DIAGNOSIS — C186 Malignant neoplasm of descending colon: Secondary | ICD-10-CM

## 2020-01-30 MED ORDER — SODIUM CHLORIDE 0.9% FLUSH
10.0000 mL | INTRAVENOUS | Status: DC | PRN
  Administered 2020-01-30: 10 mL
  Filled 2020-01-30: qty 10

## 2020-01-30 MED ORDER — HEPARIN SOD (PORK) LOCK FLUSH 100 UNIT/ML IV SOLN
500.0000 [IU] | Freq: Once | INTRAVENOUS | Status: AC | PRN
  Administered 2020-01-30: 500 [IU]
  Filled 2020-01-30: qty 5

## 2020-02-12 NOTE — Progress Notes (Signed)
[place]   [phone] Fax:([phone]   Clinic Follow up Note   Patient Care Team: [name], MD as PCP - General (Family Medicine) [name], MD as Consulting Physician (Cardiology) [name], MD as Consulting Physician (Gastroenterology) [name], MD as Consulting Physician (General Surgery) [name], MD as Consulting Physician (General Surgery) [name], MD as Consulting Physician (Urology) [name], MD as Consulting Physician (Medical Oncology) 02/13/2020  CHIEF COMPLAINT: Follow-up recurrent and metastatic colon cancer  SUMMARY OF ONCOLOGIC HISTORY: Oncology History Overview Note  Cancer Staging Cancer of left colon [facility]) Staging form: Colon and Rectum, AJCC 8th Edition - Pathologic stage from 01/11/2018: Stage IIIB (pT3, pN1c, cM0) - Signed by [name], MD on 01/16/2018     Cancer of left colon ([place])  01/10/2018 Imaging   CT AP W Contrast 01/10/18  IMPRESSION: Irregular soft tissue density causing stricture of the mid descending colon likely the site of obstruction for the dilated small bowel. 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 No evidence of perforation.  Equivocal findings involving the appendix measuring 1.2 cm at the appendiceal tip with mucosal enhancement. No adjacent free fluid or inflammatory change. Findings are nonspecific, but can be seen with early acute appendicitis.  Mild prostatic enlargement. Increased density over the posterior bladder base likely due to the large prostatic impression although cannot completely exclude a bladder mass. Urology protocol CT or ultrasound may be helpful for better evaluation.  Mild cholelithiasis.  Stable 1.5 cm cystic structure over the lower pole right kidney likely slightly hyperdense cyst.  Diverticulosis of the colon.  Aortic Atherosclerosis (ICD10-I70.0).     01/11/2018 Cancer Staging   Staging form: Colon and Rectum,  AJCC 8th Edition - Pathologic stage from 01/11/2018: Stage IIIB (pT3, pN1c, cM0) - Signed by [name], MD on 01/16/2018   01/11/2018 Surgery   LEFT COLON RESECTION, TAKEDOWN SPLENIC FLEXURE, COLOSTOMY by Dr. [name]    01/11/2018 Procedure   Colonoscopy 01/11/18 by Dr. [name]  - Malignant completely obstructing tumor in the mid descending colon. Tattooed. - Diverticulosis in the sigmoid colon. - Non-bleeding internal hemorrhoids. - No specimens collected.   01/11/2018 Pathology Results   Diagnosis 01/11/18  1. Colon, segmental resection for tumor, descending colon - INVASIVE COLORECTAL ADENOCARCINOMA, 4 CM. - TUMOR EXTENDS INTO PERICOLONIC CONNECTIVE TISSUE. - TUMOR FOCALLY INVOLVES RADIAL MARGIN. - ONE MESENTERIC TUMOR DEPOSIT. - THIRTEEN BENIGN LYMPH NODES (0/13). 2. Colon, segmental resection, splenic flexure - BENIGN COLON. - NO EVIDENCE OF MALIGNANCY .   01/11/2018 Tumor Marker   Baseline CEA at 3.4   01/16/2018 Initial Diagnosis   Cancer of left colon ([place])   01/23/2018 Imaging   CT CHEST WO CONTRAST IMPRESSION: 1. No evidence for metastatic disease within the chest. 2. Small left pleural effusion with underlying opacities which may represent atelectasis. Right basilar atelectasis. 3. Few foci of gas within the upper abdomen in the omentum with surrounding fat stranding, likely postsurgical 4. Aortic Atherosclerosis (ICD10-I70.0).   03/08/2018 - 05/15/2018 Chemotherapy   adjuvant FOLOFX. Due to side effects of neuropahty Oxaliplatin was stopped after 3 cycles and chemo was stopped after 6 cycles. He declined completing 6 months of chemo treatment.     03/19/2018 Imaging   03/19/2018 CT AP IMPRESSION: 1. Interval partial left hemicolectomy and descending colostomy. 2. Heterogeneous soft tissue density along the left anterior renal fascia is most likely postoperative (favor fat necrosis). No well-defined fluid collection. 3. Mild left lower quadrant edema, new since  01/10/2018.  This could be postoperative. Superimposed sigmoid diverticulitis and/or cystitis cannot be excluded. 4. Subtle hyperenhancing nodule within the anterior bladder dome cannot be excluded. Consider nonemergent cystoscopy. When this is performed, recommend attention to the left ureterovesicular junction and distal left ureter to evaluate questionable soft tissue fullness. 5. Cholelithiasis. 6.  Aortic Atherosclerosis (ICD10-I70.0). 7. Prostatomegaly.   11/13/2018 Imaging   CT CAP WO Contrast 11/13/18  IMPRESSION: 1. Reversal of left lower quadrant colostomy with sigmoid colon anastomosis. No complicating features. No findings for residual or recurrent tumor or metastatic disease involving the chest, abdomen or pelvis without contrast. 2. No acute abdominal/pelvic findings. 3. Gallbladder sludge and gallstones but no findings for acute cholecystitis. 4. Stable anterior abdominal wall hernia. 5. The right testicle is in the right inguinal canal.   10/03/2019 Imaging   CT CAP WO contrast  IMPRESSION: 1. New rounded density interposed between the prostate gland and anterior upper rectal wall could represent adenopathy or local extension of anterior rectal tumor. 2. Marked prostatomegaly, prostate volume 150 cubic cm. 3. Other imaging findings of potential clinical significance: Aortic Atherosclerosis (ICD10-I70.0). Coronary atherosclerosis. Trace right pleural effusion. Suspected cholelithiasis. Nonobstructive left nephrolithiasis. Multilevel lumbar impingement. Bilateral mildly retracted testicles. Hypodense exophytic lesion of the right kidney, most likely to be a cyst.   11/02/2019 Procedure   colonoscopy on 11/02/2019 by Dr. [name] Plan showed normal digital rectal exam, 3 polyps in the rectum, descending colon and cecum, and a prior sigmoid: Anastomosis characterized by erythema.  He found an extrinsic nonobstructing medium-sized mass in the proximal rectum about 4 cm in length, no internal  rectal mass.   Diagnosis Surgical [P], colon, cecum, descending, rectal, polyp (3) - TUBULAR ADENOMA (TWO) - NO HIGH GRADE DYSPLASIA OR CARCINOMA. - COLONIC FRAGMENT WITH BENIGN LYMPHOID AGGREGATE.   12/07/2019 Imaging   MRI pelvis IMPRESSION: 1. Masslike area in the rectum suspicious for rectal neoplasm, likely T4b based on the appearance of soft tissue extending along the anterior peritoneal reflection and into the seminal vesicles. Correlation with recent colonoscopy results may be helpful. Area of anastomosis and other areas of the pelvis are not imaged on today's exam.   12/21/2019 PET scan   IMPRESSION: 1. Unfortunately evidence for peritoneal metastasis. Intensely hypermetabolic nodules along the LEFT pericolic gutter. Favor hypermetabolic mass anterior to the rectum to represent serosal implant along the ventral surface of the rectum. 2. local recurrence within the LEFT abdominal wall at site prior colostomy. Intense hypermetabolic thickening of the rectus muscle at this site. 3. Two hypermetabolic hepatic metastasis.   01/15/2020 Relapse/Recurrence   FINAL MICROSCOPIC DIAGNOSIS:   A. SOFT TISSUE, LEFT ABDOMINAL WALL, BIOPSY:  - Adenocarcinoma.  - See comment.   COMMENT:   The morphology is consistent with metastatic colorectal adenocarcinoma.    01/28/2020 -  Chemotherapy   PENDING First line FOLFIRI q2weeks starting 01/28/20   01/28/2020 -  Chemotherapy   The patient had dexamethasone (DECADRON) 4 MG tablet, 8 mg, Oral, Daily, 1 of 1 cycle, Start date: --, End date: -- palonosetron (ALOXI) injection 0.25 mg, 0.25 mg, Intravenous,  Once, 2 of 4 cycles Administration: 0.25 mg (01/28/2020) irinotecan (CAMPTOSAR) 340 mg in sodium chloride 0.9 % 500 mL chemo infusion, 150 mg/m2 = 340 mg (100 % of original dose 150 mg/m2), Intravenous,  Once, 2 of 4 cycles Dose modification: 150 mg/m2 (original dose 150 mg/m2, Cycle 1, Reason: Provider Judgment) Administration: 340 mg  (01/28/2020) fluorouracil (ADRUCIL) 4,500 mg in sodium chloride 0.9 % 60 mL chemo  infusion, 2,000 mg/m2 = 4,500 mg (100 % of original dose 2,000 mg/m2), Intravenous, 1 Day/Dose, 2 of 4 cycles Dose modification: 2,000 mg/m2 (original dose 2,000 mg/m2, Cycle 1, Reason: Provider Judgment) Administration: 4,500 mg (01/28/2020) bevacizumab-awwb (MVASI) 500 mg in sodium chloride 0.9 % 100 mL chemo infusion, 5 mg/kg = 500 mg, Intravenous,  Once, 1 of 3 cycles leucovorin 900 mg in sodium chloride 0.9 % 250 mL infusion, 400 mg/m2 = 900 mg, Intravenous,  Once, 2 of 4 cycles Administration: 900 mg (01/28/2020)  for chemotherapy treatment.      CURRENT THERAPY:  First line FOLFIRI q2weeks starting 01/28/20, add bevacizumab/biosimilar with cycle 2  INTERVAL HISTORY: Mr. Molstad returns for follow-up and treatment as scheduled.  He started first-line FOLFIRI on 8/23. He had mild fatigue, nausea, and diarrhea up to 3 episodes/day for 2-3 days after pump d/c. He took zofran and imodium once which resolved symptoms. He remained active, went to the beach, and otherwise felt well. Appetite is adequate. Denies mucositis, rash, pain, fever, chills, cough, chest pain, dyspnea, leg edema, or bleeding on eliquis.    MEDICAL HISTORY:  Past Medical History:  Diagnosis Date  . Adenocarcinoma, colon ([place]) dx'd 01/2018  . Anemia    taking iron supplements  . Anxiety   . Atrial fibrillation with RVR ([place])   . Colonic obstruction ([place]) 01/10/2018  . Depression   . Diverticulitis   . Dysrhythmia    afib  . History of kidney stones     SURGICAL HISTORY: Past Surgical History:  Procedure Laterality Date  . ANKLE SURGERY Left    when he was in college  . COLON RESECTION N/A 01/11/2018   Procedure: LEFT COLON RESECTION, TAKEDOWN SPLENIC FLEXURE, COLOSTOMY;  Surgeon: [name], MD;  Location: [facility];  Service: General;  Laterality: N/A;  . COLONOSCOPY  01/11/2018   Procedure: COLONOSCOPY;  Surgeon: Jackquline Denmark, MD;   Location: [facility];  Service: Endoscopy;;  . COLONOSCOPY  05/10/2018  . colonscopy  05/10/2018  . HERNIA REPAIR    . HERNIA REPAIR  03/02/2019   EXPLORATORY LAPAROTOMY (N/A Abdomen)  . INCISIONAL HERNIA REPAIR N/A 03/02/2019   Procedure: INCISIONAL HERNIA REPAIR , RECTORECTUS VS TAR HERNIA REPAIR;  Surgeon: Ralene Ok, MD;  Location: [place];  Service: General;  Laterality: N/A;  . INSERTION OF MESH N/A 03/02/2019   Procedure: Insertion Of Mesh;  Surgeon: Ralene Ok, MD;  Location: [place];  Service: General;  Laterality: N/A;  . LAPAROTOMY N/A 03/02/2019   Procedure: EXPLORATORY LAPAROTOMY;  Surgeon: Ralene Ok, MD;  Location: [place];  Service: General;  Laterality: N/A;  . LYSIS OF ADHESION N/A 06/12/2018   Procedure: LYSIS OF ADHESIONS;  Surgeon: [name], MD;  Location: [facility];  Service: General;  Laterality: N/A;  . LYSIS OF ADHESION N/A 03/02/2019   Procedure: Lysis Of Adhesion;  Surgeon: Ralene Ok, MD;  Location: [place];  Service: General;  Laterality: N/A;  . PORTACATH PLACEMENT Right 03/07/2018   Procedure: INSERTION PORT-A-CATH RIGHT SUBCLAVIAN;  Surgeon: [name], MD;  Location: [place];  Service: General;  Laterality: Right;  . PROCTOSCOPY N/A 06/12/2018   Procedure: RIGID PROCTOSCOPY;  Surgeon: [name], MD;  Location: [facility];  Service: General;  Laterality: N/A;  . thumb surgery   2018   cyst removal    I have reviewed the social history and family history with the patient and they are unchanged from previous note.  ALLERGIES:  has No Known Allergies.  MEDICATIONS:  Current Outpatient Medications  Medication Sig Dispense Refill  . apixaban (ELIQUIS) 5 MG TABS tablet Take 1 tablet (5 mg total) by mouth 2 (two) times daily. 60 tablet 0  . b complex vitamins tablet Take 1 tablet by mouth daily.    [name] diltiazem (CARDIZEM CD) 240 MG 24 hr capsule Take 240 mg by mouth daily.    [name] docusate sodium (COLACE) 100 MG capsule Take 100 mg by mouth 2 (two)  times daily.    . Multiple Vitamins-Iron (MULTIVITAMIN/IRON PO) Take 1 tablet by mouth daily.     . prochlorperazine (COMPAZINE) 10 MG tablet Take 1 tablet (10 mg total) by mouth every 6 (six) hours as needed for nausea or vomiting. 30 tablet 0  . tamsulosin (FLOMAX) 0.4 MG CAPS capsule Take 0.4 mg by mouth 2 (two) times daily.      No current facility-administered medications for this visit.   Facility-Administered Medications Ordered in Other Visits  Medication Dose Route Frequency Provider Last Rate Last Admin  . fluorouracil (ADRUCIL) 5,400 mg in sodium chloride 0.9 % 142 mL chemo infusion  2,400 mg/m2 (Treatment Plan Recorded) Intravenous 1 day or 1 dose [name], MD   5,400 mg at 02/13/20 1340  . sodium chloride flush (NS) 0.9 % injection 10 mL  10 mL Intracatheter PRN [name], MD        PHYSICAL EXAMINATION: ECOG PERFORMANCE STATUS: 0 - Asymptomatic  Vitals:   02/13/20 0844  BP: 133/66  Resp: 18  Temp: (!) 96.7 F (35.9 C)  SpO2: 97%   Filed Weights   02/13/20 0844  Weight: 229 lb 9.6 oz (104.1 kg)    GENERAL:alert, no distress and comfortable SKIN: No rash EYES:  sclera clear LUNGS:  normal breathing effort HEART:  no lower extremity edema NEURO: alert & oriented x 3 with fluent speech PAC without erythema   LABORATORY DATA:  I have reviewed the data as listed CBC Latest Ref Rng & Units 02/13/2020 01/28/2020 01/17/2020  WBC 4.0 - 10.5 K/uL 3.7(L) 6.2 6.3  Hemoglobin 13.0 - 17.0 g/dL 12.3(L) 13.1 12.7(L)  Hematocrit 39 - 52 % 37.5(L) 39.8 38.3(L)  Platelets 150 - 400 K/uL 165 263 243     CMP Latest Ref Rng & Units 02/13/2020 01/28/2020 01/17/2020  Glucose 70 - 99 mg/dL 96 88 97  BUN 8 - 23 mg/dL 26(H) 23 23  Creatinine 0.61 - 1.24 mg/dL 1.34(H) 1.36(H) 1.37(H)  Sodium 135 - 145 mmol/L 143 142 141  Potassium 3.5 - 5.1 mmol/L 4.0 4.2 4.2  Chloride 98 - 111 mmol/L 108 105 106  CO2 22 - 32 mmol/L 27 27 27   Calcium 8.9 - 10.3 mg/dL 9.1 9.8 9.6  Total Protein 6.5 -  8.1 g/dL 6.8 7.1 7.2  Total Bilirubin 0.3 - 1.2 mg/dL 0.5 0.5 0.5  Alkaline Phos 38 - 126 U/L 81 74 78  AST 15 - 41 U/L 15 21 15   ALT 0 - 44 U/L 15 7 11       RADIOGRAPHIC STUDIES: I have personally reviewed the radiological images as listed and agreed with the findings in the report. No results found.   ASSESSMENT & PLAN: Ahmon Tosi Bennettis a 74 y.o.malewith    1. Cancer of left colon,adenocarcinoma, stage IIIB(pT3N1cM0), MSI-stable, KRAS+ -Diagnosed in 01/2018. Treated with surgery andadjuvantFOLFOX. Due to side effects Oxaliplatin was stopped after 3 cycles and chemo was stopped after 6 cycles. He declined completing 6 months of chemo treatment. -He underwent hernia repair in 02/2019. He  recovered well.  -SurveillanceCT CAP from4/28/21shows no definitive evidence of metastasis, except a soft tissue density in between prostate and rectum, indeterminate.  -colonoscopy in December 2019 showed mucosal inflammation in the rectum, biopsy was negative. -Rising CEA -Repeat colonoscopy on 11/02/2019 by Dr. [name] Plan showed normal digital rectal exam, 3 polyps in the rectum, descending colon and cecum, and a prior sigmoid: Anastomosis characterized by erythema.He found an extrinsic nonobstructing medium-sized mass in the proximal rectum about 4 cm in length,no internal rectal mass. -He underwent pelvic MRI on 12/06/2019 which was reviewed in our tumor board, showing a masslike area in the rectum suspicious for rectal neoplasm, likely T4b based on the appearance of soft tissue extending along the anterior peritoneal reflection and into the seminal vesicles.  -His PET from 12/21/2019 shows peritoneal metastasis, 2 hypermetabolic hepatic metastases, and local recurrence within the left abdominal wall which was palpable on exam 01/28/20.  His ultrasound biopsy of the left abdominal wall from 01/15/2020 shows metastatic colorectal adenocarcinoma. -It was discussed that this is recurrent metastatic  stage IV colon cancer, not resectable but still treatable.  Given his good nutrition and PS he will likely tolerate treatment well.  The goal is palliative -Began first line dose-reduced FOLFIRI on 01/28/2020 -[facility] One showed Kras (+) positive, he is not a candidate for EGFR inhibitor.  Plan to add bevacizumab with cycle 2  2. Hypothyroidism -Previously on synthroid, TSH normal 2 years ago. He has not been taking synthroid for a while -repeat TSH today is 43, I spoke to his PCP Dr. Ethlyn Gallery who will call him to discuss further management  3.BPH  -Followed by urologist Dr. Lovena Neighbours -Pelvic MRI on 7/1 shows marked prostatomegaly with signs of BPH extending into the bladder base -Denies new or worsening urinary symptoms  4. Atrial Fibrillation -continue Eliquis. Rate controlled.He can stop Eliquis periodically for bleeding hemorrhoids -Continue follow-up with cardiology  5.CKD stage III -he has developed mild increased Cr since he started chemo, EGFR around 40-50's -I encouraged him to control his blood pressure, cholesterol and BG. I also encouraged him to drink plenty of water. -Will avoid NSAIDs and CT IV contrast if GFR low   Disposition:  Mr. Telford appears stable.  He completed 1 cycle of dose reduced FOLFIRI.  He tolerated treatment well with mild fatigue, nausea, and diarrhea for 2-3 days after pump d/c.  Symptoms were well managed with supportive meds at home.  He has gained weight.  He is able to recover and function well.  We reviewed the CBC and CMP.  Today's CEA is improved.  Urine protein is stable, overall labs adequate to proceed with cycle 2 FOLFIRI which we will increase to full dose.  We are recommending to add bevacizumab. Potential side effects including HTN, proteinuria, thrombosis, bleeding, intestinal perforation and the rationale/goal were discussed. He agrees. Will add today with cycle 2.   He was previously on Synthroid for hypothyroidism but has not  been on treatment for a while. Today's TSH is 67.  I spoke to his PCP who will manage this.   He will return for follow-up and cycle 3 in 2 weeks.  No problem-specific Assessment & Plan notes found for this encounter.   Orders Placed This Encounter  Procedures  . Total Protein, Urine dipstick    Standing Status:   Standing    Number of Occurrences:   10    Standing Expiration Date:   02/12/2021   All questions were answered. The patient knows to call  the clinic with any problems, questions or concerns. No barriers to learning were detected. Total encounter time was 30 minutes.      Alla Feeling, NP 02/13/20

## 2020-02-13 ENCOUNTER — Other Ambulatory Visit: Payer: Self-pay

## 2020-02-13 ENCOUNTER — Inpatient Hospital Stay (HOSPITAL_BASED_OUTPATIENT_CLINIC_OR_DEPARTMENT_OTHER): Payer: Commercial Managed Care - PPO | Admitting: Nurse Practitioner

## 2020-02-13 ENCOUNTER — Inpatient Hospital Stay: Payer: Commercial Managed Care - PPO | Attending: Nurse Practitioner

## 2020-02-13 ENCOUNTER — Telehealth: Payer: Self-pay | Admitting: *Deleted

## 2020-02-13 ENCOUNTER — Inpatient Hospital Stay: Payer: Commercial Managed Care - PPO

## 2020-02-13 ENCOUNTER — Encounter: Payer: Self-pay | Admitting: Nurse Practitioner

## 2020-02-13 VITALS — BP 133/66 | Temp 96.7°F | Resp 18 | Ht 70.0 in | Wt 229.6 lb

## 2020-02-13 DIAGNOSIS — Z7189 Other specified counseling: Secondary | ICD-10-CM

## 2020-02-13 DIAGNOSIS — C787 Secondary malignant neoplasm of liver and intrahepatic bile duct: Secondary | ICD-10-CM | POA: Insufficient documentation

## 2020-02-13 DIAGNOSIS — I4891 Unspecified atrial fibrillation: Secondary | ICD-10-CM | POA: Diagnosis not present

## 2020-02-13 DIAGNOSIS — Z7901 Long term (current) use of anticoagulants: Secondary | ICD-10-CM | POA: Diagnosis not present

## 2020-02-13 DIAGNOSIS — R7989 Other specified abnormal findings of blood chemistry: Secondary | ICD-10-CM

## 2020-02-13 DIAGNOSIS — Z5112 Encounter for antineoplastic immunotherapy: Secondary | ICD-10-CM | POA: Insufficient documentation

## 2020-02-13 DIAGNOSIS — E039 Hypothyroidism, unspecified: Secondary | ICD-10-CM | POA: Insufficient documentation

## 2020-02-13 DIAGNOSIS — C186 Malignant neoplasm of descending colon: Secondary | ICD-10-CM

## 2020-02-13 DIAGNOSIS — C786 Secondary malignant neoplasm of retroperitoneum and peritoneum: Secondary | ICD-10-CM | POA: Insufficient documentation

## 2020-02-13 DIAGNOSIS — Z95828 Presence of other vascular implants and grafts: Secondary | ICD-10-CM

## 2020-02-13 DIAGNOSIS — Z452 Encounter for adjustment and management of vascular access device: Secondary | ICD-10-CM | POA: Insufficient documentation

## 2020-02-13 DIAGNOSIS — Z5111 Encounter for antineoplastic chemotherapy: Secondary | ICD-10-CM | POA: Diagnosis present

## 2020-02-13 DIAGNOSIS — N183 Chronic kidney disease, stage 3 unspecified: Secondary | ICD-10-CM | POA: Diagnosis not present

## 2020-02-13 LAB — CMP (CANCER CENTER ONLY)
ALT: 15 U/L (ref 0–44)
AST: 15 U/L (ref 15–41)
Albumin: 3.7 g/dL (ref 3.5–5.0)
Alkaline Phosphatase: 81 U/L (ref 38–126)
Anion gap: 8 (ref 5–15)
BUN: 26 mg/dL — ABNORMAL HIGH (ref 8–23)
CO2: 27 mmol/L (ref 22–32)
Calcium: 9.1 mg/dL (ref 8.9–10.3)
Chloride: 108 mmol/L (ref 98–111)
Creatinine: 1.34 mg/dL — ABNORMAL HIGH (ref 0.61–1.24)
GFR, Est AFR Am: >60 mL/min (ref >60)
GFR, Estimated: 52 mL/min — ABNORMAL LOW (ref >60)
Glucose, Bld: 96 mg/dL (ref 70–99)
Potassium: 4 mmol/L (ref 3.5–5.1)
Sodium: 143 mmol/L (ref 135–145)
Total Bilirubin: 0.5 mg/dL (ref 0.3–1.2)
Total Protein: 6.8 g/dL (ref 6.5–8.1)

## 2020-02-13 LAB — CBC WITH DIFFERENTIAL (CANCER CENTER ONLY)
Abs Immature Granulocytes: 0.01 10*3/uL (ref 0.00–0.07)
Basophils Absolute: 0.1 10*3/uL (ref 0.0–0.1)
Basophils Relative: 2 %
Eosinophils Absolute: 0.2 10*3/uL (ref 0.0–0.5)
Eosinophils Relative: 4 %
HCT: 37.5 % — ABNORMAL LOW (ref 39.0–52.0)
Hemoglobin: 12.3 g/dL — ABNORMAL LOW (ref 13.0–17.0)
Immature Granulocytes: 0 %
Lymphocytes Relative: 38 %
Lymphs Abs: 1.4 10*3/uL (ref 0.7–4.0)
MCH: 30.7 pg (ref 26.0–34.0)
MCHC: 32.8 g/dL (ref 30.0–36.0)
MCV: 93.5 fL (ref 80.0–100.0)
Monocytes Absolute: 0.4 10*3/uL (ref 0.1–1.0)
Monocytes Relative: 10 %
Neutro Abs: 1.7 10*3/uL (ref 1.7–7.7)
Neutrophils Relative %: 46 %
Platelet Count: 165 10*3/uL (ref 150–400)
RBC: 4.01 MIL/uL — ABNORMAL LOW (ref 4.22–5.81)
RDW: 13.5 % (ref 11.5–15.5)
WBC Count: 3.7 10*3/uL — ABNORMAL LOW (ref 4.0–10.5)
nRBC: 0 % (ref 0.0–0.2)

## 2020-02-13 LAB — TSH: TSH: 67.42 u[IU]/mL — ABNORMAL HIGH (ref 0.320–4.118)

## 2020-02-13 LAB — TOTAL PROTEIN, URINE DIPSTICK: Protein, ur: 30 mg/dL — AB

## 2020-02-13 LAB — CEA (IN HOUSE-CHCC): CEA (CHCC-In House): 13.9 ng/mL — ABNORMAL HIGH (ref 0.00–5.00)

## 2020-02-13 MED ORDER — SODIUM CHLORIDE 0.9% FLUSH
10.0000 mL | INTRAVENOUS | Status: DC | PRN
  Administered 2020-02-13: 10 mL
  Filled 2020-02-13: qty 10

## 2020-02-13 MED ORDER — SODIUM CHLORIDE 0.9 % IV SOLN
2400.0000 mg/m2 | INTRAVENOUS | Status: DC
  Administered 2020-02-13: 5400 mg via INTRAVENOUS
  Filled 2020-02-13: qty 108

## 2020-02-13 MED ORDER — SODIUM CHLORIDE 0.9 % IV SOLN
400.0000 mg/m2 | Freq: Once | INTRAVENOUS | Status: AC
  Administered 2020-02-13: 900 mg via INTRAVENOUS
  Filled 2020-02-13: qty 45

## 2020-02-13 MED ORDER — SODIUM CHLORIDE 0.9 % IV SOLN
Freq: Once | INTRAVENOUS | Status: AC
  Filled 2020-02-13: qty 250

## 2020-02-13 MED ORDER — SODIUM CHLORIDE 0.9 % IV SOLN
5.0000 mg/kg | Freq: Once | INTRAVENOUS | Status: AC
  Administered 2020-02-13: 500 mg via INTRAVENOUS
  Filled 2020-02-13: qty 16

## 2020-02-13 MED ORDER — SODIUM CHLORIDE 0.9 % IV SOLN
180.0000 mg/m2 | Freq: Once | INTRAVENOUS | Status: AC
  Administered 2020-02-13: 400 mg via INTRAVENOUS
  Filled 2020-02-13: qty 15

## 2020-02-13 MED ORDER — ATROPINE SULFATE 1 MG/ML IJ SOLN
0.5000 mg | Freq: Once | INTRAMUSCULAR | Status: AC | PRN
  Administered 2020-02-13: 0.5 mg via INTRAVENOUS

## 2020-02-13 MED ORDER — PALONOSETRON HCL INJECTION 0.25 MG/5ML
0.2500 mg | Freq: Once | INTRAVENOUS | Status: AC
  Administered 2020-02-13: 0.25 mg via INTRAVENOUS

## 2020-02-13 MED ORDER — SODIUM CHLORIDE 0.9 % IV SOLN
10.0000 mg | Freq: Once | INTRAVENOUS | Status: AC
  Administered 2020-02-13: 10 mg via INTRAVENOUS
  Filled 2020-02-13: qty 10

## 2020-02-13 MED ORDER — SODIUM CHLORIDE 0.9% FLUSH
10.0000 mL | INTRAVENOUS | Status: DC | PRN
  Filled 2020-02-13: qty 10

## 2020-02-13 MED ORDER — PALONOSETRON HCL INJECTION 0.25 MG/5ML
INTRAVENOUS | Status: AC
  Filled 2020-02-13: qty 5

## 2020-02-13 MED ORDER — ATROPINE SULFATE 1 MG/ML IJ SOLN
INTRAMUSCULAR | Status: AC
  Filled 2020-02-13: qty 1

## 2020-02-13 NOTE — Telephone Encounter (Signed)
Spoke with the pt and informed him of the message below.  Patient stated he has a chemo pump on now.  Stated Dr [name] prescribed a thyroid medication and did not give any refills.  Message sent to PCP.

## 2020-02-13 NOTE — Telephone Encounter (Signed)
I spoke with patient.   Please order the TSH, Free T3/T4 to be completed at the cancer center on Friday. He will be bringing his pump back there as infusion will be complete and I want to make things easy for him and just repeat labs at that time.   (for my note: only one rx for levothyroxine is in system and was 177mcg; patient has only one previous TSH that was normal from 01/2018 when in hospital with a fib)

## 2020-02-13 NOTE — Telephone Encounter (Signed)
Orders entered

## 2020-02-13 NOTE — Telephone Encounter (Signed)
-----   Message from [name], MD sent at 02/13/2020 12:56 PM EDT ----- Patient had thyroid checked through oncology and TSH was extremely elevated.   #I would like to get a recheck (great if we can get it done this week) to confirm that the abnormal lab is correct.   #To my knowledge he has not been on synthroid on regular basis? We had not discussed in past although I see as historical med from other providers. Please find out length of time he was taking/how recently/any problems with med.   #I would like TSH, Free T3/4 please. If thyroid is truly that low functioning, he should feel much better if we restart replacement.   (of note, only other TSH in system was normal)

## 2020-02-13 NOTE — Addendum Note (Signed)
Addended by: Agnes Lawrence on: 02/13/2020 04:13 PM   Modules accepted: Orders

## 2020-02-14 ENCOUNTER — Telehealth: Payer: Self-pay | Admitting: Nurse Practitioner

## 2020-02-14 NOTE — Telephone Encounter (Signed)
Scheduled per 9/8 los. Pt will receive an updated appt calendar, per appt notes

## 2020-02-15 ENCOUNTER — Other Ambulatory Visit: Payer: Commercial Managed Care - PPO

## 2020-02-15 ENCOUNTER — Inpatient Hospital Stay: Payer: Commercial Managed Care - PPO

## 2020-02-15 ENCOUNTER — Other Ambulatory Visit: Payer: Self-pay

## 2020-02-15 VITALS — BP 128/87 | HR 53 | Temp 98.2°F | Resp 18

## 2020-02-15 DIAGNOSIS — C186 Malignant neoplasm of descending colon: Secondary | ICD-10-CM

## 2020-02-15 DIAGNOSIS — Z5112 Encounter for antineoplastic immunotherapy: Secondary | ICD-10-CM | POA: Diagnosis not present

## 2020-02-15 DIAGNOSIS — R7989 Other specified abnormal findings of blood chemistry: Secondary | ICD-10-CM

## 2020-02-15 DIAGNOSIS — Z7189 Other specified counseling: Secondary | ICD-10-CM

## 2020-02-15 LAB — TSH: TSH: 91.79 mIU/L — ABNORMAL HIGH (ref 0.40–4.50)

## 2020-02-15 LAB — T4, FREE: Free T4: 0.8 ng/dL (ref 0.8–1.8)

## 2020-02-15 LAB — T3, FREE: T3, Free: 2.5 pg/mL (ref 2.3–4.2)

## 2020-02-15 MED ORDER — SODIUM CHLORIDE 0.9% FLUSH
10.0000 mL | INTRAVENOUS | Status: DC | PRN
  Administered 2020-02-15: 10 mL
  Filled 2020-02-15: qty 10

## 2020-02-15 MED ORDER — HEPARIN SOD (PORK) LOCK FLUSH 100 UNIT/ML IV SOLN
500.0000 [IU] | Freq: Once | INTRAVENOUS | Status: AC | PRN
  Administered 2020-02-15: 500 [IU]
  Filled 2020-02-15: qty 5

## 2020-02-15 NOTE — Patient Instructions (Signed)

## 2020-02-16 ENCOUNTER — Other Ambulatory Visit: Payer: Self-pay | Admitting: Family Medicine

## 2020-02-16 ENCOUNTER — Encounter: Payer: Self-pay | Admitting: Family Medicine

## 2020-02-16 MED ORDER — LEVOTHYROXINE SODIUM 75 MCG PO TABS: 75.0000 ug | ORAL_TABLET | Freq: Every day | ORAL | 1 refills | Status: DC

## 2020-02-22 NOTE — Progress Notes (Signed)
[place]   [phone] [phone]   Clinic Follow up Note   Patient Care Team: [name], MD as PCP - General (Family Medicine) [name], MD as Consulting Physician (Cardiology) [name], MD as Consulting Physician (Gastroenterology) [name], MD as Consulting Physician (General Surgery) [name], MD as Consulting Physician (General Surgery) [name], MD as Consulting Physician (Urology) [name], MD as Consulting Physician (Medical Oncology)  Date of Service:  02/25/2020  CHIEF COMPLAINT: F/u on colon cancer  SUMMARY OF ONCOLOGIC HISTORY: Oncology History Overview Note  Cancer Staging Cancer of left colon [facility]) Staging form: Colon and Rectum, AJCC 8th Edition - Pathologic stage from 01/11/2018: Stage IIIB (pT3, pN1c, cM0) - Signed by [name], MD on 01/16/2018     Cancer of left colon ([place])  01/10/2018 Imaging   CT AP W Contrast 01/10/18  IMPRESSION: Irregular soft tissue density causing stricture of the mid descending colon likely the site of obstruction for the dilated small bowel. 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 No evidence of perforation.  Equivocal findings involving the appendix measuring 1.2 cm at the appendiceal tip with mucosal enhancement. No adjacent free fluid or inflammatory change. Findings are nonspecific, but can be seen with early acute appendicitis.  Mild prostatic enlargement. Increased density over the posterior bladder base likely due to the large prostatic impression although cannot completely exclude a bladder mass. Urology protocol CT or ultrasound may be helpful for better evaluation.  Mild cholelithiasis.  Stable 1.5 cm cystic structure over the lower pole right kidney likely slightly hyperdense cyst.  Diverticulosis of the colon.  Aortic Atherosclerosis (ICD10-I70.0).     01/11/2018 Cancer Staging   Staging form: Colon and Rectum, AJCC  8th Edition - Pathologic stage from 01/11/2018: Stage IIIB (pT3, pN1c, cM0) - Signed by [name], MD on 01/16/2018   01/11/2018 Surgery   LEFT COLON RESECTION, TAKEDOWN SPLENIC FLEXURE, COLOSTOMY by Dr. [name]    01/11/2018 Procedure   Colonoscopy 01/11/18 by Dr. [name]  - Malignant completely obstructing tumor in the mid descending colon. Tattooed. - Diverticulosis in the sigmoid colon. - Non-bleeding internal hemorrhoids. - No specimens collected.   01/11/2018 Pathology Results   Diagnosis 01/11/18  1. Colon, segmental resection for tumor, descending colon - INVASIVE COLORECTAL ADENOCARCINOMA, 4 CM. - TUMOR EXTENDS INTO PERICOLONIC CONNECTIVE TISSUE. - TUMOR FOCALLY INVOLVES RADIAL MARGIN. - ONE MESENTERIC TUMOR DEPOSIT. - THIRTEEN BENIGN LYMPH NODES (0/13). 2. Colon, segmental resection, splenic flexure - BENIGN COLON. - NO EVIDENCE OF MALIGNANCY .   01/11/2018 Tumor Marker   Baseline CEA at 3.4   01/16/2018 Initial Diagnosis   Cancer of left colon ([place])   01/23/2018 Imaging   CT CHEST WO CONTRAST IMPRESSION: 1. No evidence for metastatic disease within the chest. 2. Small left pleural effusion with underlying opacities which may represent atelectasis. Right basilar atelectasis. 3. Few foci of gas within the upper abdomen in the omentum with surrounding fat stranding, likely postsurgical 4. Aortic Atherosclerosis (ICD10-I70.0).   03/08/2018 - 05/15/2018 Chemotherapy   adjuvant FOLOFX. Due to side effects of neuropahty Oxaliplatin was stopped after 3 cycles and chemo was stopped after 6 cycles. He declined completing 6 months of chemo treatment.     03/19/2018 Imaging   03/19/2018 CT AP IMPRESSION: 1. Interval partial left hemicolectomy and descending colostomy. 2. Heterogeneous soft tissue density along the left anterior renal fascia is most likely postoperative (favor fat necrosis). No well-defined fluid collection. 3. Mild left lower quadrant  edema, new since 01/10/2018. This  could be postoperative. Superimposed sigmoid diverticulitis and/or cystitis cannot be excluded. 4. Subtle hyperenhancing nodule within the anterior bladder dome cannot be excluded. Consider nonemergent cystoscopy. When this is performed, recommend attention to the left ureterovesicular junction and distal left ureter to evaluate questionable soft tissue fullness. 5. Cholelithiasis. 6.  Aortic Atherosclerosis (ICD10-I70.0). 7. Prostatomegaly.   11/13/2018 Imaging   CT CAP WO Contrast 11/13/18  IMPRESSION: 1. Reversal of left lower quadrant colostomy with sigmoid colon anastomosis. No complicating features. No findings for residual or recurrent tumor or metastatic disease involving the chest, abdomen or pelvis without contrast. 2. No acute abdominal/pelvic findings. 3. Gallbladder sludge and gallstones but no findings for acute cholecystitis. 4. Stable anterior abdominal wall hernia. 5. The right testicle is in the right inguinal canal.   10/03/2019 Imaging   CT CAP WO contrast  IMPRESSION: 1. New rounded density interposed between the prostate gland and anterior upper rectal wall could represent adenopathy or local extension of anterior rectal tumor. 2. Marked prostatomegaly, prostate volume 150 cubic cm. 3. Other imaging findings of potential clinical significance: Aortic Atherosclerosis (ICD10-I70.0). Coronary atherosclerosis. Trace right pleural effusion. Suspected cholelithiasis. Nonobstructive left nephrolithiasis. Multilevel lumbar impingement. Bilateral mildly retracted testicles. Hypodense exophytic lesion of the right kidney, most likely to be a cyst.   11/02/2019 Procedure   colonoscopy on 11/02/2019 by Dr. [name] Plan showed normal digital rectal exam, 3 polyps in the rectum, descending colon and cecum, and a prior sigmoid: Anastomosis characterized by erythema.  He found an extrinsic nonobstructing medium-sized mass in the proximal rectum about 4 cm in length, no internal  rectal mass.   Diagnosis Surgical [P], colon, cecum, descending, rectal, polyp (3) - TUBULAR ADENOMA (TWO) - NO HIGH GRADE DYSPLASIA OR CARCINOMA. - COLONIC FRAGMENT WITH BENIGN LYMPHOID AGGREGATE.   12/07/2019 Imaging   MRI pelvis IMPRESSION: 1. Masslike area in the rectum suspicious for rectal neoplasm, likely T4b based on the appearance of soft tissue extending along the anterior peritoneal reflection and into the seminal vesicles. Correlation with recent colonoscopy results may be helpful. Area of anastomosis and other areas of the pelvis are not imaged on today's exam.   12/21/2019 PET scan   IMPRESSION: 1. Unfortunately evidence for peritoneal metastasis. Intensely hypermetabolic nodules along the LEFT pericolic gutter. Favor hypermetabolic mass anterior to the rectum to represent serosal implant along the ventral surface of the rectum. 2. local recurrence within the LEFT abdominal wall at site prior colostomy. Intense hypermetabolic thickening of the rectus muscle at this site. 3. Two hypermetabolic hepatic metastasis.   01/15/2020 Relapse/Recurrence   FINAL MICROSCOPIC DIAGNOSIS:   A. SOFT TISSUE, LEFT ABDOMINAL WALL, BIOPSY:  - Adenocarcinoma.  - See comment.   COMMENT:   The morphology is consistent with metastatic colorectal adenocarcinoma.    01/28/2020 -  Chemotherapy   First line FOLFIRI q2weeks starting 01/28/20.Avastin added with C2.       CURRENT THERAPY:  First line FOLFIRI q2weeks starting 01/28/20.Avastin added with C2.   INTERVAL HISTORY:  Dave Vaughn is here for a follow up. He presents to the clinic alone. He notes he is dong well. He has tolerated first 2 cycles of chemo well. He has 1 episode of diarrhea. He denies nausea and able to manage appetite and eating. He is overall tolerating well so far. He notes he has meeting the first week of October. 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 He travels still while off  chemo.  REVIEW OF SYSTEMS:   Constitutional: Denies fevers, chills or abnormal weight loss Eyes: Denies blurriness of vision Ears, nose, mouth, throat, and face: Denies mucositis or sore throat Respiratory: Denies cough, dyspnea or wheezes Cardiovascular: Denies palpitation, chest discomfort or lower extremity swelling Gastrointestinal:  Denies nausea, heartburn or change in bowel habits Skin: Denies abnormal skin rashes Lymphatics: Denies new lymphadenopathy or easy bruising Neurological:Denies numbness, tingling or new weaknesses Behavioral/Psych: Mood is stable, no new changes  All other systems were reviewed with the patient and are negative.  MEDICAL HISTORY:  Past Medical History:  Diagnosis Date  . Adenocarcinoma, colon ([place]) dx'd 01/2018  . Anemia    taking iron supplements  . Anxiety   . Atrial fibrillation with RVR ([place])   . Colonic obstruction ([place]) 01/10/2018  . Depression   . Diverticulitis   . Dysrhythmia    afib  . History of kidney stones     SURGICAL HISTORY: Past Surgical History:  Procedure Laterality Date  . ANKLE SURGERY Left    when he was in college  . COLON RESECTION N/A 01/11/2018   Procedure: LEFT COLON RESECTION, TAKEDOWN SPLENIC FLEXURE, COLOSTOMY;  Surgeon: [name], MD;  Location: [facility];  Service: General;  Laterality: N/A;  . COLONOSCOPY  01/11/2018   Procedure: COLONOSCOPY;  Surgeon: Jackquline Denmark, MD;  Location: [facility];  Service: Endoscopy;;  . COLONOSCOPY  05/10/2018  . colonscopy  05/10/2018  . HERNIA REPAIR    . HERNIA REPAIR  03/02/2019   EXPLORATORY LAPAROTOMY (N/A Abdomen)  . INCISIONAL HERNIA REPAIR N/A 03/02/2019   Procedure: INCISIONAL HERNIA REPAIR , RECTORECTUS VS TAR HERNIA REPAIR;  Surgeon: Ralene Ok, MD;  Location: [facility];  Service: General;  Laterality: N/A;  . INSERTION OF MESH N/A 03/02/2019   Procedure: Insertion Of Mesh;  Surgeon: Ralene Ok, MD;  Location: [facility];  Service: General;  Laterality: N/A;  .  LAPAROTOMY N/A 03/02/2019   Procedure: EXPLORATORY LAPAROTOMY;  Surgeon: Ralene Ok, MD;  Location: [facility];  Service: General;  Laterality: N/A;  . LYSIS OF ADHESION N/A 06/12/2018   Procedure: LYSIS OF ADHESIONS;  Surgeon: [name], MD;  Location: [facility];  Service: General;  Laterality: N/A;  . LYSIS OF ADHESION N/A 03/02/2019   Procedure: Lysis Of Adhesion;  Surgeon: Ralene Ok, MD;  Location: [facility];  Service: General;  Laterality: N/A;  . PORTACATH PLACEMENT Right 03/07/2018   Procedure: INSERTION PORT-A-CATH RIGHT SUBCLAVIAN;  Surgeon: [name], MD;  Location: [place] [facility];  Service: General;  Laterality: Right;  . PROCTOSCOPY N/A 06/12/2018   Procedure: RIGID PROCTOSCOPY;  Surgeon: [name], MD;  Location: [facility];  Service: General;  Laterality: N/A;  . thumb surgery   2018   cyst removal    I have reviewed the social history and family history with the patient and they are unchanged from previous note.  ALLERGIES:  has No Known Allergies.  MEDICATIONS:  Current Outpatient Medications  Medication Sig Dispense Refill  . apixaban (ELIQUIS) 5 MG TABS tablet Take 1 tablet (5 mg total) by mouth 2 (two) times daily. 60 tablet 0  . b complex vitamins tablet Take 1 tablet by mouth daily.    [name] diltiazem (CARDIZEM CD) 240 MG 24 hr capsule Take 240 mg by mouth daily.    [name] docusate sodium (COLACE) 100 MG capsule Take 100 mg by mouth 2 (two) times daily.    [name] levothyroxine (SYNTHROID) 75 MCG tablet Take 1 tablet (75 mcg total) by mouth daily.  90 tablet 1  . Multiple Vitamins-Iron (MULTIVITAMIN/IRON PO) Take 1 tablet by mouth daily.     . prochlorperazine (COMPAZINE) 10 MG tablet Take 1 tablet (10 mg total) by mouth every 6 (six) hours as needed for nausea or vomiting. 30 tablet 0  . tamsulosin (FLOMAX) 0.4 MG CAPS capsule Take 0.4 mg by mouth 2 (two) times daily.      No current facility-administered medications for this visit.   Facility-Administered Medications Ordered in  Other Visits  Medication Dose Route Frequency Provider Last Rate Last Admin  . atropine injection 0.5 mg  0.5 mg Intravenous Once PRN [name], MD      . bevacizumab-awwb (MVASI) 500 mg in sodium chloride 0.9 % 100 mL chemo infusion  5 mg/kg (Treatment Plan Recorded) Intravenous Once [name], MD 720 mL/hr at 02/25/20 1318 500 mg at 02/25/20 1318  . fluorouracil (ADRUCIL) 5,400 mg in sodium chloride 0.9 % 142 mL chemo infusion  2,400 mg/m2 (Treatment Plan Recorded) Intravenous 1 day or 1 dose [name], MD      . irinotecan (CAMPTOSAR) 400 mg in sodium chloride 0.9 % 500 mL chemo infusion  180 mg/m2 (Treatment Plan Recorded) Intravenous Once [name], MD      . leucovorin 900 mg in sodium chloride 0.9 % 250 mL infusion  400 mg/m2 (Treatment Plan Recorded) Intravenous Once [name], MD        PHYSICAL EXAMINATION: ECOG PERFORMANCE STATUS: 0 - Asymptomatic  Vitals:   02/25/20 1136  BP: 140/85  Pulse: 85  Resp: 20  Temp: 97.8 F (36.6 C)  SpO2: 100%   Filed Weights   02/25/20 1136  Weight: 230 lb 11.2 oz (104.6 kg)    Due to COVID19 we will limit examination to appearance. Patient had no complaints.  GENERAL:alert, no distress and comfortable SKIN: skin color normal, no rashes or significant lesions EYES: normal, Conjunctiva are pink and non-injected, sclera clear  NEURO: alert & oriented x 3 with fluent speech    LABORATORY DATA:  I have reviewed the data as listed CBC Latest Ref Rng & Units 02/25/2020 02/13/2020 01/28/2020  WBC 4.0 - 10.5 K/uL 4.3 3.7(L) 6.2  Hemoglobin 13.0 - 17.0 g/dL 12.3(L) 12.3(L) 13.1  Hematocrit 39 - 52 % 37.0(L) 37.5(L) 39.8  Platelets 150 - 400 K/uL 223 165 263     CMP Latest Ref Rng & Units 02/25/2020 02/13/2020 01/28/2020  Glucose 70 - 99 mg/dL 95 96 88  BUN 8 - 23 mg/dL 17 26(H) 23  Creatinine 0.61 - 1.24 mg/dL 1.26(H) 1.34(H) 1.36(H)  Sodium 135 - 145 mmol/L 141 143 142  Potassium 3.5 - 5.1 mmol/L 4.1 4.0 4.2  Chloride 98 - 111 mmol/L 107 108  105  CO2 22 - 32 mmol/L _0 Calcium 8.9 - 10.3 mg/dL 8.9 9.1 9.8  Total Protein 6.5 - 8.1 g/dL 6.8 6.8 7.1  Total Bilirubin 0.3 - 1.2 mg/dL 0.5 0.5 0.5  Alkaline Phos 38 - 126 U/L 76 81 74  AST 15 - 41 U/L _1 ALT 0 - 44 U/L _2 RADIOGRAPHIC STUDIES: I have personally reviewed the radiological images as listed and agreed with the findings in the report. No results found.   ASSESSMENT & PLAN:  KEIYON PLACK is a 74 y.o. male with    1. Cancer of left colon,adenocarcinoma, stage IIIB(pT3N1cM0), MSI-stable, liver and peritoneal recurrence 12/2019 -Diagnosed in 01/2018. Treated with surgery andadjuvantFOLFOX. Due  to side effects Oxaliplatin was stopped after 3 cycles and chemo was stopped after 6 cycles. He declined completing 6 months of chemo treatment. -Unfortunately he had local recurrence in left abdominal wall with peritoneal metastasis, Two hypermetabolic hepatic metastasis in 12/2019. His US Biopsy of abdominal wall from 01/15/20 confirmed metastatic colorectal adenocarcinoma.  -His 01/15/20 FO results show Kras mutation, MSS, he is not a candidate for EGFR inhibitor or immunotherapy  -I started him on first-line FOLFIRI q2weeks on 01/28/20. Avastin was added with C2. If disease well controlled after at least 6 months or poor tolerance, we can switch to maintenance chemo with 5FU or oral Xeloda. I discussed role of target therapy with RT or Surgery if indicated. Due to his both peritoneal and liver metastasis, unfortunately he is probably not a candidate for surgery such as HIPEC  -S/p C2 he has tolerating well with mild nausea and one episode of diarrhea. Has managed weight, appetite and energy well. He will continue antiemetics as needed.  -Lab reviewed and adequate to proceed with C3 FOLFIRI and Avastin today at same dose.  -Plan to scan him after C6  -f/u in 2 weeks.  2. Atrial Fibrillation -continue Eliquis. Continue follow-up with cardiology Dr  Lyla Son  3.CKD stage III -Will avoid NSAIDs and CT IV contrast if GFR low -Will monitor on chemo.   4. BPH  -Followed by urologist Dr. Lovena Neighbours -Pelvic MRI on 7/1 shows marked prostatomegaly with signs of BPH extending into the bladder base  5. Goal of care discussion  -The patient understands the goal of care is palliative. -he is full code now   6. Elevated TSH  -02/13/20 TSH was elevated at 67.420 and increased to 91.79 on 02/15/20  -He is on Synthroid since 02/16/20.    Plan -Labs reviewed and adequate to proceed with C3 FOLFIRI and Avastin today, same dose  -Lab, flush, f/u and FOLFIRI and Avastin on 10/4 and 10/18 and 11/1    No problem-specific Assessment & Plan notes found for this encounter.   No orders of the defined types were placed in this encounter.  All questions were answered. The patient knows to call the clinic with any problems, questions or concerns. No barriers to learning was detected.      [name], MD 02/25/2020   I, Joslyn Devon, am acting as scribe for [name], MD.   I have reviewed the above documentation for accuracy and completeness, and I agree with the above.

## 2020-02-25 ENCOUNTER — Inpatient Hospital Stay: Payer: Commercial Managed Care - PPO

## 2020-02-25 ENCOUNTER — Encounter: Payer: Self-pay | Admitting: Hematology

## 2020-02-25 ENCOUNTER — Other Ambulatory Visit: Payer: Self-pay

## 2020-02-25 ENCOUNTER — Inpatient Hospital Stay (HOSPITAL_BASED_OUTPATIENT_CLINIC_OR_DEPARTMENT_OTHER): Payer: Commercial Managed Care - PPO | Admitting: Hematology

## 2020-02-25 ENCOUNTER — Telehealth: Payer: Self-pay | Admitting: Hematology

## 2020-02-25 VITALS — BP 140/85 | HR 85 | Temp 97.8°F | Resp 20 | Ht 70.0 in | Wt 230.7 lb

## 2020-02-25 DIAGNOSIS — C186 Malignant neoplasm of descending colon: Secondary | ICD-10-CM

## 2020-02-25 DIAGNOSIS — N1831 Chronic kidney disease, stage 3a: Secondary | ICD-10-CM

## 2020-02-25 DIAGNOSIS — Z7189 Other specified counseling: Secondary | ICD-10-CM

## 2020-02-25 DIAGNOSIS — Z5112 Encounter for antineoplastic immunotherapy: Secondary | ICD-10-CM | POA: Diagnosis not present

## 2020-02-25 DIAGNOSIS — I482 Chronic atrial fibrillation, unspecified: Secondary | ICD-10-CM

## 2020-02-25 LAB — CBC WITH DIFFERENTIAL (CANCER CENTER ONLY)
Abs Immature Granulocytes: 0.01 10*3/uL (ref 0.00–0.07)
Basophils Absolute: 0 10*3/uL (ref 0.0–0.1)
Basophils Relative: 1 %
Eosinophils Absolute: 0.2 10*3/uL (ref 0.0–0.5)
Eosinophils Relative: 5 %
HCT: 37 % — ABNORMAL LOW (ref 39.0–52.0)
Hemoglobin: 12.3 g/dL — ABNORMAL LOW (ref 13.0–17.0)
Immature Granulocytes: 0 %
Lymphocytes Relative: 37 %
Lymphs Abs: 1.6 10*3/uL (ref 0.7–4.0)
MCH: 30.8 pg (ref 26.0–34.0)
MCHC: 33.2 g/dL (ref 30.0–36.0)
MCV: 92.5 fL (ref 80.0–100.0)
Monocytes Absolute: 0.4 10*3/uL (ref 0.1–1.0)
Monocytes Relative: 9 %
Neutro Abs: 2.1 10*3/uL (ref 1.7–7.7)
Neutrophils Relative %: 48 %
Platelet Count: 223 10*3/uL (ref 150–400)
RBC: 4 MIL/uL — ABNORMAL LOW (ref 4.22–5.81)
RDW: 13.7 % (ref 11.5–15.5)
WBC Count: 4.3 10*3/uL (ref 4.0–10.5)
nRBC: 0 % (ref 0.0–0.2)

## 2020-02-25 LAB — CMP (CANCER CENTER ONLY)
ALT: 17 U/L (ref 0–44)
AST: 16 U/L (ref 15–41)
Albumin: 3.8 g/dL (ref 3.5–5.0)
Alkaline Phosphatase: 76 U/L (ref 38–126)
Anion gap: 7 (ref 5–15)
BUN: 17 mg/dL (ref 8–23)
CO2: 27 mmol/L (ref 22–32)
Calcium: 8.9 mg/dL (ref 8.9–10.3)
Chloride: 107 mmol/L (ref 98–111)
Creatinine: 1.26 mg/dL — ABNORMAL HIGH (ref 0.61–1.24)
GFR, Est AFR Am: >60 mL/min (ref >60)
GFR, Estimated: 56 mL/min — ABNORMAL LOW (ref >60)
Glucose, Bld: 95 mg/dL (ref 70–99)
Potassium: 4.1 mmol/L (ref 3.5–5.1)
Sodium: 141 mmol/L (ref 135–145)
Total Bilirubin: 0.5 mg/dL (ref 0.3–1.2)
Total Protein: 6.8 g/dL (ref 6.5–8.1)

## 2020-02-25 LAB — CEA (IN HOUSE-CHCC): CEA (CHCC-In House): 8.74 ng/mL — ABNORMAL HIGH (ref 0.00–5.00)

## 2020-02-25 MED ORDER — SODIUM CHLORIDE 0.9 % IV SOLN
2400.0000 mg/m2 | INTRAVENOUS | Status: DC
  Administered 2020-02-25: 5400 mg via INTRAVENOUS
  Filled 2020-02-25: qty 108

## 2020-02-25 MED ORDER — SODIUM CHLORIDE 0.9 % IV SOLN
Freq: Once | INTRAVENOUS | Status: AC
  Filled 2020-02-25: qty 250

## 2020-02-25 MED ORDER — SODIUM CHLORIDE 0.9 % IV SOLN
5.0000 mg/kg | Freq: Once | INTRAVENOUS | Status: AC
  Administered 2020-02-25: 500 mg via INTRAVENOUS
  Filled 2020-02-25: qty 16

## 2020-02-25 MED ORDER — ATROPINE SULFATE 1 MG/ML IJ SOLN
INTRAMUSCULAR | Status: AC
  Filled 2020-02-25: qty 1

## 2020-02-25 MED ORDER — SODIUM CHLORIDE 0.9 % IV SOLN
180.0000 mg/m2 | Freq: Once | INTRAVENOUS | Status: AC
  Administered 2020-02-25: 400 mg via INTRAVENOUS
  Filled 2020-02-25: qty 5

## 2020-02-25 MED ORDER — ATROPINE SULFATE 1 MG/ML IJ SOLN
0.5000 mg | Freq: Once | INTRAMUSCULAR | Status: AC | PRN
  Administered 2020-02-25: 0.5 mg via INTRAVENOUS

## 2020-02-25 MED ORDER — SODIUM CHLORIDE 0.9 % IV SOLN
10.0000 mg | Freq: Once | INTRAVENOUS | Status: AC
  Administered 2020-02-25: 10 mg via INTRAVENOUS
  Filled 2020-02-25: qty 10

## 2020-02-25 MED ORDER — PALONOSETRON HCL INJECTION 0.25 MG/5ML
0.2500 mg | Freq: Once | INTRAVENOUS | Status: AC
  Administered 2020-02-25: 0.25 mg via INTRAVENOUS

## 2020-02-25 MED ORDER — SODIUM CHLORIDE 0.9 % IV SOLN
400.0000 mg/m2 | Freq: Once | INTRAVENOUS | Status: AC
  Administered 2020-02-25: 900 mg via INTRAVENOUS
  Filled 2020-02-25: qty 45

## 2020-02-25 MED ORDER — PALONOSETRON HCL INJECTION 0.25 MG/5ML
INTRAVENOUS | Status: AC
  Filled 2020-02-25: qty 5

## 2020-02-25 NOTE — Telephone Encounter (Signed)
Scheduled appointments per 9/20 los. Spoke with patient in person. Patient is aware of upcoming appointment. Patient declined calendar print out.

## 2020-02-25 NOTE — Patient Instructions (Signed)
[facility] Discharge Instructions for Patients Receiving Chemotherapy  Today you received the following chemotherapy agents: Bevacizumab, Leucovorin, Irinotecan, and Fluorouracil  To help prevent nausea and vomiting after your treatment, we encourage you to take your nausea medication  as prescribed.    If you develop nausea and vomiting that is not controlled by your nausea medication, call the clinic.   BELOW ARE SYMPTOMS THAT SHOULD BE REPORTED IMMEDIATELY:  *FEVER GREATER THAN 100.5 F  *CHILLS WITH OR WITHOUT FEVER  NAUSEA AND VOMITING THAT IS NOT CONTROLLED WITH YOUR NAUSEA MEDICATION  *UNUSUAL SHORTNESS OF BREATH  *UNUSUAL BRUISING OR BLEEDING  TENDERNESS IN MOUTH AND THROAT WITH OR WITHOUT PRESENCE OF ULCERS  *URINARY PROBLEMS  *BOWEL PROBLEMS  UNUSUAL RASH Items with * indicate a potential emergency and should be followed up as soon as possible.  Feel free to call the clinic should you have any questions or concerns. The clinic phone number is [phone].  Please show the [facility] at check-in to the Emergency Department and triage nurse.

## 2020-02-25 NOTE — Patient Instructions (Signed)

## 2020-02-27 ENCOUNTER — Other Ambulatory Visit: Payer: Self-pay

## 2020-02-27 ENCOUNTER — Inpatient Hospital Stay: Payer: Commercial Managed Care - PPO

## 2020-02-27 VITALS — BP 131/70 | HR 74 | Temp 97.9°F | Resp 18

## 2020-02-27 DIAGNOSIS — Z7189 Other specified counseling: Secondary | ICD-10-CM

## 2020-02-27 DIAGNOSIS — Z5112 Encounter for antineoplastic immunotherapy: Secondary | ICD-10-CM | POA: Diagnosis not present

## 2020-02-27 DIAGNOSIS — C186 Malignant neoplasm of descending colon: Secondary | ICD-10-CM

## 2020-02-27 MED ORDER — HEPARIN SOD (PORK) LOCK FLUSH 100 UNIT/ML IV SOLN
500.0000 [IU] | Freq: Once | INTRAVENOUS | Status: AC | PRN
  Administered 2020-02-27: 500 [IU]
  Filled 2020-02-27: qty 5

## 2020-02-27 MED ORDER — SODIUM CHLORIDE 0.9% FLUSH
10.0000 mL | INTRAVENOUS | Status: DC | PRN
  Administered 2020-02-27: 10 mL
  Filled 2020-02-27: qty 10

## 2020-02-27 NOTE — Patient Instructions (Signed)

## 2020-03-07 NOTE — Progress Notes (Signed)
[place]   [phone] [phone]) [phone]   Clinic Follow up Note   Patient Care Team: [name], MD as PCP - General (Family Medicine) [name], MD as Consulting Physician (Cardiology) [name], MD as Consulting Physician (Gastroenterology) [name], MD as Consulting Physician (General Surgery) [name], MD as Consulting Physician (General Surgery) [name], MD as Consulting Physician (Urology) [name], MD as Consulting Physician (Medical Oncology)  Date of Service:  03/10/2020  CHIEF COMPLAINT: F/u on colon cancer  SUMMARY OF ONCOLOGIC HISTORY: Oncology History Overview Note  Cancer Staging Cancer of left colon [facility]) Staging form: Colon and Rectum, AJCC 8th Edition - Pathologic stage from 01/11/2018: Stage IIIB (pT3, pN1c, cM0) - Signed by [name], MD on 01/16/2018     Cancer of left colon ([place])  01/10/2018 Imaging   CT AP W Contrast 01/10/18  IMPRESSION: Irregular soft tissue density causing stricture of the mid descending colon likely the site of obstruction for the dilated small bowel. 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 No evidence of perforation.  Equivocal findings involving the appendix measuring 1.2 cm at the appendiceal tip with mucosal enhancement. No adjacent free fluid or inflammatory change. Findings are nonspecific, but can be seen with early acute appendicitis.  Mild prostatic enlargement. Increased density over the posterior bladder base likely due to the large prostatic impression although cannot completely exclude a bladder mass. Urology protocol CT or ultrasound may be helpful for better evaluation.  Mild cholelithiasis.  Stable 1.5 cm cystic structure over the lower pole right kidney likely slightly hyperdense cyst.  Diverticulosis of the colon.  Aortic Atherosclerosis (ICD10-I70.0).     01/11/2018 Cancer Staging   Staging form: Colon and Rectum, AJCC  8th Edition - Pathologic stage from 01/11/2018: Stage IIIB (pT3, pN1c, cM0) - Signed by [name], MD on 01/16/2018   01/11/2018 Surgery   LEFT COLON RESECTION, TAKEDOWN SPLENIC FLEXURE, COLOSTOMY by Dr. [name]    01/11/2018 Procedure   Colonoscopy 01/11/18 by Dr. [name]  - Malignant completely obstructing tumor in the mid descending colon. Tattooed. - Diverticulosis in the sigmoid colon. - Non-bleeding internal hemorrhoids. - No specimens collected.   01/11/2018 Pathology Results   Diagnosis 01/11/18  1. Colon, segmental resection for tumor, descending colon - INVASIVE COLORECTAL ADENOCARCINOMA, 4 CM. - TUMOR EXTENDS INTO PERICOLONIC CONNECTIVE TISSUE. - TUMOR FOCALLY INVOLVES RADIAL MARGIN. - ONE MESENTERIC TUMOR DEPOSIT. - THIRTEEN BENIGN LYMPH NODES (0/13). 2. Colon, segmental resection, splenic flexure - BENIGN COLON. - NO EVIDENCE OF MALIGNANCY .   01/11/2018 Tumor Marker   Baseline CEA at 3.4   01/16/2018 Initial Diagnosis   Cancer of left colon ([place])   01/23/2018 Imaging   CT CHEST WO CONTRAST IMPRESSION: 1. No evidence for metastatic disease within the chest. 2. Small left pleural effusion with underlying opacities which may represent atelectasis. Right basilar atelectasis. 3. Few foci of gas within the upper abdomen in the omentum with surrounding fat stranding, likely postsurgical 4. Aortic Atherosclerosis (ICD10-I70.0).   03/08/2018 - 05/15/2018 Chemotherapy   adjuvant FOLOFX. Due to side effects of neuropahty Oxaliplatin was stopped after 3 cycles and chemo was stopped after 6 cycles. He declined completing 6 months of chemo treatment.     03/19/2018 Imaging   03/19/2018 CT AP IMPRESSION: 1. Interval partial left hemicolectomy and descending colostomy. 2. Heterogeneous soft tissue density along the left anterior renal fascia is most likely postoperative (favor fat necrosis). No well-defined fluid collection. 3. Mild left lower quadrant  edema, new since 01/10/2018. This  could be postoperative. Superimposed sigmoid diverticulitis and/or cystitis cannot be excluded. 4. Subtle hyperenhancing nodule within the anterior bladder dome cannot be excluded. Consider nonemergent cystoscopy. When this is performed, recommend attention to the left ureterovesicular junction and distal left ureter to evaluate questionable soft tissue fullness. 5. Cholelithiasis. 6.  Aortic Atherosclerosis (ICD10-I70.0). 7. Prostatomegaly.   11/13/2018 Imaging   CT CAP WO Contrast 11/13/18  IMPRESSION: 1. Reversal of left lower quadrant colostomy with sigmoid colon anastomosis. No complicating features. No findings for residual or recurrent tumor or metastatic disease involving the chest, abdomen or pelvis without contrast. 2. No acute abdominal/pelvic findings. 3. Gallbladder sludge and gallstones but no findings for acute cholecystitis. 4. Stable anterior abdominal wall hernia. 5. The right testicle is in the right inguinal canal.   10/03/2019 Imaging   CT CAP WO contrast  IMPRESSION: 1. New rounded density interposed between the prostate gland and anterior upper rectal wall could represent adenopathy or local extension of anterior rectal tumor. 2. Marked prostatomegaly, prostate volume 150 cubic cm. 3. Other imaging findings of potential clinical significance: Aortic Atherosclerosis (ICD10-I70.0). Coronary atherosclerosis. Trace right pleural effusion. Suspected cholelithiasis. Nonobstructive left nephrolithiasis. Multilevel lumbar impingement. Bilateral mildly retracted testicles. Hypodense exophytic lesion of the right kidney, most likely to be a cyst.   11/02/2019 Procedure   colonoscopy on 11/02/2019 by Dr. [name] Plan showed normal digital rectal exam, 3 polyps in the rectum, descending colon and cecum, and a prior sigmoid: Anastomosis characterized by erythema.  He found an extrinsic nonobstructing medium-sized mass in the proximal rectum about 4 cm in length, no internal  rectal mass.   Diagnosis Surgical [P], colon, cecum, descending, rectal, polyp (3) - TUBULAR ADENOMA (TWO) - NO HIGH GRADE DYSPLASIA OR CARCINOMA. - COLONIC FRAGMENT WITH BENIGN LYMPHOID AGGREGATE.   12/07/2019 Imaging   MRI pelvis IMPRESSION: 1. Masslike area in the rectum suspicious for rectal neoplasm, likely T4b based on the appearance of soft tissue extending along the anterior peritoneal reflection and into the seminal vesicles. Correlation with recent colonoscopy results may be helpful. Area of anastomosis and other areas of the pelvis are not imaged on today's exam.   12/21/2019 PET scan   IMPRESSION: 1. Unfortunately evidence for peritoneal metastasis. Intensely hypermetabolic nodules along the LEFT pericolic gutter. Favor hypermetabolic mass anterior to the rectum to represent serosal implant along the ventral surface of the rectum. 2. local recurrence within the LEFT abdominal wall at site prior colostomy. Intense hypermetabolic thickening of the rectus muscle at this site. 3. Two hypermetabolic hepatic metastasis.   01/15/2020 Relapse/Recurrence   FINAL MICROSCOPIC DIAGNOSIS:   A. SOFT TISSUE, LEFT ABDOMINAL WALL, BIOPSY:  - Adenocarcinoma.  - See comment.   COMMENT:   The morphology is consistent with metastatic colorectal adenocarcinoma.    01/28/2020 -  Chemotherapy   First line FOLFIRI q2weeks starting 01/28/20.Avastin added with C2.       CURRENT THERAPY:  First line FOLFIRI q2weeks starting 01/28/20.Avastin added with C2.   INTERVAL HISTORY:  Dave Vaughn is here for a follow up. He presents to the clinic with his wife. He notes he is doing well. He notes last cycle chemo went well. He denies neuropathy. He had mild diarrhea but this is managed with imodium. He is able to eat well and weight is fluctuating. He denies any current concerns. He notes he has about 24 hours of fatigue. He will rest as needed but overall remains busy with work.  REVIEW OF SYSTEMS:   Constitutional: Denies fevers, chills or abnormal weight loss Eyes: Denies blurriness of vision Ears, nose, mouth, throat, and face: Denies mucositis or sore throat Respiratory: Denies cough, dyspnea or wheezes Cardiovascular: Denies palpitation, chest discomfort or lower extremity swelling Gastrointestinal:  Denies nausea, heartburn or change in bowel habits Skin: Denies abnormal skin rashes Lymphatics: Denies new lymphadenopathy or easy bruising Neurological:Denies numbness, tingling or new weaknesses Behavioral/Psych: Mood is stable, no new changes  All other systems were reviewed with the patient and are negative.  MEDICAL HISTORY:  Past Medical History:  Diagnosis Date  . Adenocarcinoma, colon ([place]) dx'd 01/2018  . Anemia    taking iron supplements  . Anxiety   . Atrial fibrillation with RVR ([place])   . Colonic obstruction ([place]) 01/10/2018  . Depression   . Diverticulitis   . Dysrhythmia    afib  . History of kidney stones     SURGICAL HISTORY: Past Surgical History:  Procedure Laterality Date  . ANKLE SURGERY Left    when he was in college  . COLON RESECTION N/A 01/11/2018   Procedure: LEFT COLON RESECTION, TAKEDOWN SPLENIC FLEXURE, COLOSTOMY;  Surgeon: [name], MD;  Location: [facility];  Service: General;  Laterality: N/A;  . COLONOSCOPY  01/11/2018   Procedure: COLONOSCOPY;  Surgeon: Jackquline Denmark, MD;  Location: [facility];  Service: Endoscopy;;  . COLONOSCOPY  05/10/2018  . colonscopy  05/10/2018  . HERNIA REPAIR    . HERNIA REPAIR  03/02/2019   EXPLORATORY LAPAROTOMY (N/A Abdomen)  . INCISIONAL HERNIA REPAIR N/A 03/02/2019   Procedure: INCISIONAL HERNIA REPAIR , RECTORECTUS VS TAR HERNIA REPAIR;  Surgeon: Ralene Ok, MD;  Location: [place];  Service: General;  Laterality: N/A;  . INSERTION OF MESH N/A 03/02/2019   Procedure: Insertion Of Mesh;  Surgeon: Ralene Ok, MD;  Location: [place];  Service: General;  Laterality: N/A;  .  LAPAROTOMY N/A 03/02/2019   Procedure: EXPLORATORY LAPAROTOMY;  Surgeon: Ralene Ok, MD;  Location: [place];  Service: General;  Laterality: N/A;  . LYSIS OF ADHESION N/A 06/12/2018   Procedure: LYSIS OF ADHESIONS;  Surgeon: [name], MD;  Location: [facility];  Service: General;  Laterality: N/A;  . LYSIS OF ADHESION N/A 03/02/2019   Procedure: Lysis Of Adhesion;  Surgeon: Ralene Ok, MD;  Location: [place];  Service: General;  Laterality: N/A;  . PORTACATH PLACEMENT Right 03/07/2018   Procedure: INSERTION PORT-A-CATH RIGHT SUBCLAVIAN;  Surgeon: [name], MD;  Location: [place];  Service: General;  Laterality: Right;  . PROCTOSCOPY N/A 06/12/2018   Procedure: RIGID PROCTOSCOPY;  Surgeon: [name], MD;  Location: [facility];  Service: General;  Laterality: N/A;  . thumb surgery   2018   cyst removal    I have reviewed the social history and family history with the patient and they are unchanged from previous note.  ALLERGIES:  has No Known Allergies.  MEDICATIONS:  Current Outpatient Medications  Medication Sig Dispense Refill  . apixaban (ELIQUIS) 5 MG TABS tablet Take 1 tablet (5 mg total) by mouth 2 (two) times daily. 60 tablet 0  . b complex vitamins tablet Take 1 tablet by mouth daily.    [name] diltiazem (CARDIZEM CD) 240 MG 24 hr capsule Take 240 mg by mouth daily.    [name] docusate sodium (COLACE) 100 MG capsule Take 100 mg by mouth 2 (two) times daily.    [name] levothyroxine (SYNTHROID) 75 MCG tablet Take 1 tablet (75 mcg total) by mouth daily.  90 tablet 1  . Multiple Vitamins-Iron (MULTIVITAMIN/IRON PO) Take 1 tablet by mouth daily.     . prochlorperazine (COMPAZINE) 10 MG tablet Take 1 tablet (10 mg total) by mouth every 6 (six) hours as needed for nausea or vomiting. 30 tablet 0  . tamsulosin (FLOMAX) 0.4 MG CAPS capsule Take 0.4 mg by mouth 2 (two) times daily.      No current facility-administered medications for this visit.    PHYSICAL EXAMINATION: ECOG PERFORMANCE STATUS:  0 - Asymptomatic  Vitals:   03/10/20 0841  BP: 116/68  Pulse: 66  Resp: 19  Temp: 97.9 F (36.6 C)  SpO2: 98%   Filed Weights   03/10/20 0841  Weight: 226 lb 12.8 oz (102.9 kg)    Due to COVID19 we will limit examination to appearance. Patient had no complaints.  GENERAL:alert, no distress and comfortable SKIN: skin color normal, no rashes or significant lesions EYES: normal, Conjunctiva are pink and non-injected, sclera clear  NEURO: alert & oriented x 3 with fluent speech    LABORATORY DATA:  I have reviewed the data as listed CBC Latest Ref Rng & Units 03/10/2020 02/25/2020 02/13/2020  WBC 4.0 - 10.5 K/uL 4.4 4.3 3.7(L)  Hemoglobin 13.0 - 17.0 g/dL 12.2(L) 12.3(L) 12.3(L)  Hematocrit 39 - 52 % 36.6(L) 37.0(L) 37.5(L)  Platelets 150 - 400 K/uL 203 223 165     CMP Latest Ref Rng & Units 02/25/2020 02/13/2020 01/28/2020  Glucose 70 - 99 mg/dL 95 96 88  BUN 8 - 23 mg/dL 17 26(H) 23  Creatinine 0.61 - 1.24 mg/dL 1.26(H) 1.34(H) 1.36(H)  Sodium 135 - 145 mmol/L 141 143 142  Potassium 3.5 - 5.1 mmol/L 4.1 4.0 4.2  Chloride 98 - 111 mmol/L 107 108 105  CO2 22 - 32 mmol/L $RemoveB'27 27 27  'VHkQfAyl$ Calcium 8.9 - 10.3 mg/dL 8.9 9.1 9.8  Total Protein 6.5 - 8.1 g/dL 6.8 6.8 7.1  Total Bilirubin 0.3 - 1.2 mg/dL 0.5 0.5 0.5  Alkaline Phos 38 - 126 U/L 76 81 74  AST 15 - 41 U/L $Remo'16 15 21  'jadgR$ ALT 0 - 44 U/L $Remo'17 15 7      'YbhWJ$ RADIOGRAPHIC STUDIES: I have personally reviewed the radiological images as listed and agreed with the findings in the report. No results found.   ASSESSMENT & PLAN:  Dave Vaughn is a 74 y.o. male with    1. Cancer of left colon,adenocarcinoma, stage IIIB(pT3N1cM0), MSI-stable, liver and peritoneal recurrence 12/2019 -Diagnosed in 01/2018. Treated with surgery andadjuvantFOLFOX. Due to side effects Oxaliplatin was stopped after 3 cycles and chemo was stopped after 6 cycles. He declined completing 6 months of chemo treatment. -Unfortunately he had local recurrence in left  abdominal wall withperitoneal metastasis,Two hypermetabolic hepatic metastasis in 12/2019. HisUS Biopsyof abdominal wallfrom 01/15/20 confirmed metastatic colorectal adenocarcinoma. -His 01/15/20 FO results show Kras mutation, MSS, he is not a candidate for EGFR inhibitor or immunotherapy  -I started him on first-line FOLFIRI q2weeks on 01/28/20. Avastin was added with C2. If disease well controlled afterat least 6 months or poor tolerance, we can switch to maintenance chemo with 5FU or oral Xeloda. I discussed role of target therapy with RT or Surgery if indicated. Due to his both peritoneal and liver metastasis, unfortunately he is probably not a candidate for surgery such as HIPEC  -S/p C3 he is tolerating treatment well with mild fatigue and diarrhea after chemo and fluctuating weight which are all manageable. Labs reviewed, Hg 12.2, Urine Protein 100.  CEA still pending. Overall labs adequate to proceed with C4 FOLFIRI and Bevacizumab today.  -Plan to scan him after C6  -f/u in 3 weeks with next cycle chemo due to his travel in 2 weeks.  -Flu shot offered today, he declined for now.   2. Atrial Fibrillation -continue Eliquis. Continue follow-up with cardiologyDr Hawani  3.CKD stage III -Will avoid NSAIDs and CT IV contrast if GFR low -Will monitor on chemo.  4.BPH  -Followed by urologist Dr. Lovena Neighbours -Pelvic MRI on 7/1 shows marked prostatomegaly with signs of BPH extending into the bladder base  5.Goal of care discussion  -The patient understands the goal of care is palliative. -He is full code now  6. Elevated TSH  -He is on Synthroid since 02/16/20.    Plan -Labs reviewed and adequate to proceed with C4 FOLFIRI and Bevacizumab today, same dose  -Lab, flush, f/u and chemo on 10/27, 11/10, 11/24 -will order restaging CT on next visit    No problem-specific Assessment & Plan notes found for this encounter.   No orders of the defined types were placed in this  encounter.  All questions were answered. The patient knows to call the clinic with any problems, questions or concerns. No barriers to learning was detected. The total time spent in the appointment was 30 minutes.     [name], MD 03/10/2020   I, Joslyn Devon, am acting as scribe for [name], MD.   I have reviewed the above documentation for accuracy and completeness, and I agree with the above.

## 2020-03-10 ENCOUNTER — Other Ambulatory Visit: Payer: Self-pay

## 2020-03-10 ENCOUNTER — Inpatient Hospital Stay (HOSPITAL_BASED_OUTPATIENT_CLINIC_OR_DEPARTMENT_OTHER): Payer: Commercial Managed Care - PPO | Admitting: Hematology

## 2020-03-10 ENCOUNTER — Other Ambulatory Visit: Payer: Commercial Managed Care - PPO

## 2020-03-10 ENCOUNTER — Inpatient Hospital Stay: Payer: Commercial Managed Care - PPO | Attending: Nurse Practitioner

## 2020-03-10 ENCOUNTER — Inpatient Hospital Stay: Payer: Commercial Managed Care - PPO

## 2020-03-10 ENCOUNTER — Ambulatory Visit: Payer: Commercial Managed Care - PPO

## 2020-03-10 ENCOUNTER — Encounter: Payer: Self-pay | Admitting: Hematology

## 2020-03-10 ENCOUNTER — Ambulatory Visit: Payer: Commercial Managed Care - PPO | Admitting: Hematology

## 2020-03-10 ENCOUNTER — Telehealth: Payer: Self-pay | Admitting: Hematology

## 2020-03-10 VITALS — BP 116/68 | HR 66 | Temp 97.9°F | Resp 19 | Ht 70.0 in | Wt 226.8 lb

## 2020-03-10 VITALS — BP 128/82

## 2020-03-10 DIAGNOSIS — Z95828 Presence of other vascular implants and grafts: Secondary | ICD-10-CM

## 2020-03-10 DIAGNOSIS — C186 Malignant neoplasm of descending colon: Secondary | ICD-10-CM

## 2020-03-10 DIAGNOSIS — Z7901 Long term (current) use of anticoagulants: Secondary | ICD-10-CM | POA: Diagnosis not present

## 2020-03-10 DIAGNOSIS — N183 Chronic kidney disease, stage 3 unspecified: Secondary | ICD-10-CM | POA: Insufficient documentation

## 2020-03-10 DIAGNOSIS — C787 Secondary malignant neoplasm of liver and intrahepatic bile duct: Secondary | ICD-10-CM | POA: Diagnosis not present

## 2020-03-10 DIAGNOSIS — I4891 Unspecified atrial fibrillation: Secondary | ICD-10-CM | POA: Insufficient documentation

## 2020-03-10 DIAGNOSIS — R946 Abnormal results of thyroid function studies: Secondary | ICD-10-CM | POA: Diagnosis not present

## 2020-03-10 DIAGNOSIS — Z5112 Encounter for antineoplastic immunotherapy: Secondary | ICD-10-CM | POA: Insufficient documentation

## 2020-03-10 DIAGNOSIS — C786 Secondary malignant neoplasm of retroperitoneum and peritoneum: Secondary | ICD-10-CM | POA: Insufficient documentation

## 2020-03-10 DIAGNOSIS — Z452 Encounter for adjustment and management of vascular access device: Secondary | ICD-10-CM | POA: Diagnosis not present

## 2020-03-10 DIAGNOSIS — Z5111 Encounter for antineoplastic chemotherapy: Secondary | ICD-10-CM | POA: Insufficient documentation

## 2020-03-10 DIAGNOSIS — E039 Hypothyroidism, unspecified: Secondary | ICD-10-CM | POA: Insufficient documentation

## 2020-03-10 DIAGNOSIS — I482 Chronic atrial fibrillation, unspecified: Secondary | ICD-10-CM

## 2020-03-10 DIAGNOSIS — Z7189 Other specified counseling: Secondary | ICD-10-CM

## 2020-03-10 LAB — CBC WITH DIFFERENTIAL (CANCER CENTER ONLY)
Abs Immature Granulocytes: 0.01 10*3/uL (ref 0.00–0.07)
Basophils Absolute: 0.1 10*3/uL (ref 0.0–0.1)
Basophils Relative: 1 %
Eosinophils Absolute: 0.2 10*3/uL (ref 0.0–0.5)
Eosinophils Relative: 4 %
HCT: 36.6 % — ABNORMAL LOW (ref 39.0–52.0)
Hemoglobin: 12.2 g/dL — ABNORMAL LOW (ref 13.0–17.0)
Immature Granulocytes: 0 %
Lymphocytes Relative: 33 %
Lymphs Abs: 1.4 10*3/uL (ref 0.7–4.0)
MCH: 30.8 pg (ref 26.0–34.0)
MCHC: 33.3 g/dL (ref 30.0–36.0)
MCV: 92.4 fL (ref 80.0–100.0)
Monocytes Absolute: 0.4 10*3/uL (ref 0.1–1.0)
Monocytes Relative: 9 %
Neutro Abs: 2.3 10*3/uL (ref 1.7–7.7)
Neutrophils Relative %: 53 %
Platelet Count: 203 10*3/uL (ref 150–400)
RBC: 3.96 MIL/uL — ABNORMAL LOW (ref 4.22–5.81)
RDW: 14.2 % (ref 11.5–15.5)
WBC Count: 4.4 10*3/uL (ref 4.0–10.5)
nRBC: 0 % (ref 0.0–0.2)

## 2020-03-10 LAB — CMP (CANCER CENTER ONLY)
ALT: 19 U/L (ref 0–44)
AST: 17 U/L (ref 15–41)
Albumin: 3.5 g/dL (ref 3.5–5.0)
Alkaline Phosphatase: 83 U/L (ref 38–126)
Anion gap: 9 (ref 5–15)
BUN: 18 mg/dL (ref 8–23)
CO2: 26 mmol/L (ref 22–32)
Calcium: 8.9 mg/dL (ref 8.9–10.3)
Chloride: 109 mmol/L (ref 98–111)
Creatinine: 1.4 mg/dL — ABNORMAL HIGH (ref 0.61–1.24)
GFR, Est AFR Am: 57 mL/min — ABNORMAL LOW (ref >60)
GFR, Estimated: 49 mL/min — ABNORMAL LOW (ref >60)
Glucose, Bld: 107 mg/dL — ABNORMAL HIGH (ref 70–99)
Potassium: 3.7 mmol/L (ref 3.5–5.1)
Sodium: 144 mmol/L (ref 135–145)
Total Bilirubin: 0.5 mg/dL (ref 0.3–1.2)
Total Protein: 6.6 g/dL (ref 6.5–8.1)

## 2020-03-10 LAB — CEA (IN HOUSE-CHCC): CEA (CHCC-In House): 5.48 ng/mL — ABNORMAL HIGH (ref 0.00–5.00)

## 2020-03-10 LAB — TOTAL PROTEIN, URINE DIPSTICK: Protein, ur: 100 mg/dL — AB

## 2020-03-10 MED ORDER — ALTEPLASE 2 MG IJ SOLR
2.0000 mg | Freq: Once | INTRAMUSCULAR | Status: AC | PRN
  Administered 2020-03-10: 2 mg
  Filled 2020-03-10: qty 2

## 2020-03-10 MED ORDER — PALONOSETRON HCL INJECTION 0.25 MG/5ML
0.2500 mg | Freq: Once | INTRAVENOUS | Status: AC
  Administered 2020-03-10: 0.25 mg via INTRAVENOUS

## 2020-03-10 MED ORDER — SODIUM CHLORIDE 0.9 % IV SOLN
5.0000 mg/kg | Freq: Once | INTRAVENOUS | Status: AC
  Administered 2020-03-10: 500 mg via INTRAVENOUS
  Filled 2020-03-10: qty 16

## 2020-03-10 MED ORDER — SODIUM CHLORIDE 0.9% FLUSH
10.0000 mL | INTRAVENOUS | Status: DC | PRN
  Administered 2020-03-10: 10 mL
  Filled 2020-03-10: qty 10

## 2020-03-10 MED ORDER — SODIUM CHLORIDE 0.9 % IV SOLN
180.0000 mg/m2 | Freq: Once | INTRAVENOUS | Status: AC
  Administered 2020-03-10: 400 mg via INTRAVENOUS
  Filled 2020-03-10: qty 15

## 2020-03-10 MED ORDER — ATROPINE SULFATE 1 MG/ML IJ SOLN
INTRAMUSCULAR | Status: AC
  Filled 2020-03-10: qty 1

## 2020-03-10 MED ORDER — ALTEPLASE 2 MG IJ SOLR
INTRAMUSCULAR | Status: AC
  Filled 2020-03-10: qty 2

## 2020-03-10 MED ORDER — SODIUM CHLORIDE 0.9 % IV SOLN
10.0000 mg | Freq: Once | INTRAVENOUS | Status: AC
  Administered 2020-03-10: 10 mg via INTRAVENOUS
  Filled 2020-03-10: qty 10

## 2020-03-10 MED ORDER — SODIUM CHLORIDE 0.9 % IV SOLN
Freq: Once | INTRAVENOUS | Status: AC
  Filled 2020-03-10: qty 250

## 2020-03-10 MED ORDER — SODIUM CHLORIDE 0.9 % IV SOLN
2400.0000 mg/m2 | INTRAVENOUS | Status: DC
  Administered 2020-03-10: 5400 mg via INTRAVENOUS
  Filled 2020-03-10: qty 108

## 2020-03-10 MED ORDER — SODIUM CHLORIDE 0.9 % IV SOLN
400.0000 mg/m2 | Freq: Once | INTRAVENOUS | Status: AC
  Administered 2020-03-10: 900 mg via INTRAVENOUS
  Filled 2020-03-10: qty 45

## 2020-03-10 MED ORDER — PALONOSETRON HCL INJECTION 0.25 MG/5ML
INTRAVENOUS | Status: AC
  Filled 2020-03-10: qty 5

## 2020-03-10 MED ORDER — ATROPINE SULFATE 1 MG/ML IJ SOLN
0.5000 mg | Freq: Once | INTRAMUSCULAR | Status: AC | PRN
  Administered 2020-03-10: 0.5 mg via INTRAVENOUS

## 2020-03-10 NOTE — Progress Notes (Signed)
Per MD Burr Medico, ok to treat with urine protein today

## 2020-03-10 NOTE — Patient Instructions (Signed)
[name] Discharge Instructions for Patients Receiving Chemotherapy  Today you received the following chemotherapy agents Avastin, Irinotecan and Leucovorin   To help prevent nausea and vomiting after your treatment, we encourage you to take your nausea medication as directed.    If you develop nausea and vomiting that is not controlled by your nausea medication, call the clinic.   BELOW ARE SYMPTOMS THAT SHOULD BE REPORTED IMMEDIATELY:  *FEVER GREATER THAN 100.5 F  *CHILLS WITH OR WITHOUT FEVER  NAUSEA AND VOMITING THAT IS NOT CONTROLLED WITH YOUR NAUSEA MEDICATION  *UNUSUAL SHORTNESS OF BREATH  *UNUSUAL BRUISING OR BLEEDING  TENDERNESS IN MOUTH AND THROAT WITH OR WITHOUT PRESENCE OF ULCERS  *URINARY PROBLEMS  *BOWEL PROBLEMS  UNUSUAL RASH Items with * indicate a potential emergency and should be followed up as soon as possible.  Feel free to call the clinic should you have any questions or concerns. The clinic phone number is [phone].  Please show the [facility] at check-in to the Emergency Department and triage nurse.

## 2020-03-10 NOTE — Telephone Encounter (Signed)
Scheduled per 10/04 los, patient received updated calender.

## 2020-03-11 ENCOUNTER — Ambulatory Visit: Payer: Commercial Managed Care - PPO | Admitting: Nurse Practitioner

## 2020-03-11 ENCOUNTER — Other Ambulatory Visit: Payer: Commercial Managed Care - PPO

## 2020-03-12 ENCOUNTER — Other Ambulatory Visit: Payer: Self-pay

## 2020-03-12 ENCOUNTER — Ambulatory Visit: Payer: Commercial Managed Care - PPO | Admitting: Hematology

## 2020-03-12 ENCOUNTER — Other Ambulatory Visit: Payer: Commercial Managed Care - PPO

## 2020-03-12 ENCOUNTER — Ambulatory Visit: Payer: Commercial Managed Care - PPO

## 2020-03-12 ENCOUNTER — Inpatient Hospital Stay: Payer: Commercial Managed Care - PPO

## 2020-03-12 DIAGNOSIS — Z95828 Presence of other vascular implants and grafts: Secondary | ICD-10-CM

## 2020-03-12 DIAGNOSIS — Z5112 Encounter for antineoplastic immunotherapy: Secondary | ICD-10-CM | POA: Diagnosis not present

## 2020-03-12 MED ORDER — HEPARIN SOD (PORK) LOCK FLUSH 100 UNIT/ML IV SOLN
500.0000 [IU] | Freq: Once | INTRAVENOUS | Status: AC | PRN
  Administered 2020-03-12: 500 [IU]
  Filled 2020-03-12: qty 5

## 2020-03-12 MED ORDER — SODIUM CHLORIDE 0.9% FLUSH
10.0000 mL | INTRAVENOUS | Status: DC | PRN
  Administered 2020-03-12: 10 mL
  Filled 2020-03-12: qty 10

## 2020-03-12 NOTE — Patient Instructions (Signed)

## 2020-03-24 ENCOUNTER — Ambulatory Visit: Payer: Commercial Managed Care - PPO | Admitting: Nurse Practitioner

## 2020-03-24 ENCOUNTER — Other Ambulatory Visit: Payer: Commercial Managed Care - PPO

## 2020-03-24 ENCOUNTER — Ambulatory Visit: Payer: Commercial Managed Care - PPO

## 2020-03-27 ENCOUNTER — Encounter: Payer: Self-pay | Admitting: Hematology

## 2020-03-31 NOTE — Progress Notes (Signed)
[place]   [phone] [phone]   Clinic Follow up Note   Patient Care Team: [name], MD as PCP - General (Family Medicine) [name], MD as Consulting Physician (Cardiology) [name], MD as Consulting Physician (Gastroenterology) [name], MD as Consulting Physician (General Surgery) [name], MD as Consulting Physician (General Surgery) [name], MD as Consulting Physician (Urology) [name], MD as Consulting Physician (Medical Oncology) 04/01/2020  CHIEF COMPLAINT: Follow-up colon cancer  SUMMARY OF ONCOLOGIC HISTORY: Oncology History Overview Note  Cancer Staging Cancer of left colon [facility]) Staging form: Colon and Rectum, AJCC 8th Edition - Pathologic stage from 01/11/2018: Stage IIIB (pT3, pN1c, cM0) - Signed by [name], MD on 01/16/2018     Cancer of left colon ([place])  01/10/2018 Imaging   CT AP W Contrast 01/10/18  IMPRESSION: Irregular soft tissue density causing stricture of the mid descending colon likely the site of obstruction for the dilated small bowel. 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 No evidence of perforation.  Equivocal findings involving the appendix measuring 1.2 cm at the appendiceal tip with mucosal enhancement. No adjacent free fluid or inflammatory change. Findings are nonspecific, but can be seen with early acute appendicitis.  Mild prostatic enlargement. Increased density over the posterior bladder base likely due to the large prostatic impression although cannot completely exclude a bladder mass. Urology protocol CT or ultrasound may be helpful for better evaluation.  Mild cholelithiasis.  Stable 1.5 cm cystic structure over the lower pole right kidney likely slightly hyperdense cyst.  Diverticulosis of the colon.  Aortic Atherosclerosis (ICD10-I70.0).     01/11/2018 Cancer Staging   Staging form: Colon and Rectum, AJCC 8th Edition -  Pathologic stage from 01/11/2018: Stage IIIB (pT3, pN1c, cM0) - Signed by [name], MD on 01/16/2018   01/11/2018 Surgery   LEFT COLON RESECTION, TAKEDOWN SPLENIC FLEXURE, COLOSTOMY by Dr. [name]    01/11/2018 Procedure   Colonoscopy 01/11/18 by Dr. [name]  - Malignant completely obstructing tumor in the mid descending colon. Tattooed. - Diverticulosis in the sigmoid colon. - Non-bleeding internal hemorrhoids. - No specimens collected.   01/11/2018 Pathology Results   Diagnosis 01/11/18  1. Colon, segmental resection for tumor, descending colon - INVASIVE COLORECTAL ADENOCARCINOMA, 4 CM. - TUMOR EXTENDS INTO PERICOLONIC CONNECTIVE TISSUE. - TUMOR FOCALLY INVOLVES RADIAL MARGIN. - ONE MESENTERIC TUMOR DEPOSIT. - THIRTEEN BENIGN LYMPH NODES (0/13). 2. Colon, segmental resection, splenic flexure - BENIGN COLON. - NO EVIDENCE OF MALIGNANCY .   01/11/2018 Tumor Marker   Baseline CEA at 3.4   01/16/2018 Initial Diagnosis   Cancer of left colon ([place])   01/23/2018 Imaging   CT CHEST WO CONTRAST IMPRESSION: 1. No evidence for metastatic disease within the chest. 2. Small left pleural effusion with underlying opacities which may represent atelectasis. Right basilar atelectasis. 3. Few foci of gas within the upper abdomen in the omentum with surrounding fat stranding, likely postsurgical 4. Aortic Atherosclerosis (ICD10-I70.0).   03/08/2018 - 05/15/2018 Chemotherapy   adjuvant FOLOFX. Due to side effects of neuropahty Oxaliplatin was stopped after 3 cycles and chemo was stopped after 6 cycles. He declined completing 6 months of chemo treatment.     03/19/2018 Imaging   03/19/2018 CT AP IMPRESSION: 1. Interval partial left hemicolectomy and descending colostomy. 2. Heterogeneous soft tissue density along the left anterior renal fascia is most likely postoperative (favor fat necrosis). No well-defined fluid collection. 3. Mild left lower quadrant edema, new since 01/10/2018. This could  be  postoperative. Superimposed sigmoid diverticulitis and/or cystitis cannot be excluded. 4. Subtle hyperenhancing nodule within the anterior bladder dome cannot be excluded. Consider nonemergent cystoscopy. When this is performed, recommend attention to the left ureterovesicular junction and distal left ureter to evaluate questionable soft tissue fullness. 5. Cholelithiasis. 6.  Aortic Atherosclerosis (ICD10-I70.0). 7. Prostatomegaly.   11/13/2018 Imaging   CT CAP WO Contrast 11/13/18  IMPRESSION: 1. Reversal of left lower quadrant colostomy with sigmoid colon anastomosis. No complicating features. No findings for residual or recurrent tumor or metastatic disease involving the chest, abdomen or pelvis without contrast. 2. No acute abdominal/pelvic findings. 3. Gallbladder sludge and gallstones but no findings for acute cholecystitis. 4. Stable anterior abdominal wall hernia. 5. The right testicle is in the right inguinal canal.   10/03/2019 Imaging   CT CAP WO contrast  IMPRESSION: 1. New rounded density interposed between the prostate gland and anterior upper rectal wall could represent adenopathy or local extension of anterior rectal tumor. 2. Marked prostatomegaly, prostate volume 150 cubic cm. 3. Other imaging findings of potential clinical significance: Aortic Atherosclerosis (ICD10-I70.0). Coronary atherosclerosis. Trace right pleural effusion. Suspected cholelithiasis. Nonobstructive left nephrolithiasis. Multilevel lumbar impingement. Bilateral mildly retracted testicles. Hypodense exophytic lesion of the right kidney, most likely to be a cyst.   11/02/2019 Procedure   colonoscopy on 11/02/2019 by Dr. [name] Plan showed normal digital rectal exam, 3 polyps in the rectum, descending colon and cecum, and a prior sigmoid: Anastomosis characterized by erythema.  He found an extrinsic nonobstructing medium-sized mass in the proximal rectum about 4 cm in length, no internal rectal  mass.   Diagnosis Surgical [P], colon, cecum, descending, rectal, polyp (3) - TUBULAR ADENOMA (TWO) - NO HIGH GRADE DYSPLASIA OR CARCINOMA. - COLONIC FRAGMENT WITH BENIGN LYMPHOID AGGREGATE.   12/07/2019 Imaging   MRI pelvis IMPRESSION: 1. Masslike area in the rectum suspicious for rectal neoplasm, likely T4b based on the appearance of soft tissue extending along the anterior peritoneal reflection and into the seminal vesicles. Correlation with recent colonoscopy results may be helpful. Area of anastomosis and other areas of the pelvis are not imaged on today's exam.   12/21/2019 PET scan   IMPRESSION: 1. Unfortunately evidence for peritoneal metastasis. Intensely hypermetabolic nodules along the LEFT pericolic gutter. Favor hypermetabolic mass anterior to the rectum to represent serosal implant along the ventral surface of the rectum. 2. local recurrence within the LEFT abdominal wall at site prior colostomy. Intense hypermetabolic thickening of the rectus muscle at this site. 3. Two hypermetabolic hepatic metastasis.   01/15/2020 Relapse/Recurrence   FINAL MICROSCOPIC DIAGNOSIS:   A. SOFT TISSUE, LEFT ABDOMINAL WALL, BIOPSY:  - Adenocarcinoma.  - See comment.   COMMENT:   The morphology is consistent with metastatic colorectal adenocarcinoma.    01/28/2020 -  Chemotherapy   First line FOLFIRI q2weeks starting 01/28/20.Avastin added with C2.      CURRENT THERAPY: First line FOLFIRI q2weeks starting 01/28/20.Avastin added with C2.  INTERVAL HISTORY: Dave Vaughn returns for follow-up and treatment as scheduled.  He completed cycle 4 on 03/10/2020.  He is having rapid hair loss.  He is fatigued while the pump is infusing.  Starting after cycle 3 he has intermittent but mild diarrhea, resolves after taking Imodium.  Denies nausea, vomiting, abdominal pain, mucositis, fever, cough, chills, chest pain, dyspnea, neuropathy, bleeding on Eliquis, or other new concerns.    MEDICAL  HISTORY:  Past Medical History:  Diagnosis Date  . Adenocarcinoma, colon ([place]) dx'd 01/2018  . Anemia  taking iron supplements  . Anxiety   . Atrial fibrillation with RVR ([place])   . Colonic obstruction ([place]) 01/10/2018  . Depression   . Diverticulitis   . Dysrhythmia    afib  . History of kidney stones     SURGICAL HISTORY: Past Surgical History:  Procedure Laterality Date  . ANKLE SURGERY Left    when he was in college  . COLON RESECTION N/A 01/11/2018   Procedure: LEFT COLON RESECTION, TAKEDOWN SPLENIC FLEXURE, COLOSTOMY;  Surgeon: [name], MD;  Location: [facility];  Service: General;  Laterality: N/A;  . COLONOSCOPY  01/11/2018   Procedure: COLONOSCOPY;  Surgeon: Jackquline Denmark, MD;  Location: [facility];  Service: Endoscopy;;  . COLONOSCOPY  05/10/2018  . colonscopy  05/10/2018  . HERNIA REPAIR    . HERNIA REPAIR  03/02/2019   EXPLORATORY LAPAROTOMY (N/A Abdomen)  . INCISIONAL HERNIA REPAIR N/A 03/02/2019   Procedure: INCISIONAL HERNIA REPAIR , RECTORECTUS VS TAR HERNIA REPAIR;  Surgeon: Ralene Ok, MD;  Location: [facility];  Service: General;  Laterality: N/A;  . INSERTION OF MESH N/A 03/02/2019   Procedure: Insertion Of Mesh;  Surgeon: Ralene Ok, MD;  Location: [facility];  Service: General;  Laterality: N/A;  . LAPAROTOMY N/A 03/02/2019   Procedure: EXPLORATORY LAPAROTOMY;  Surgeon: Ralene Ok, MD;  Location: [facility];  Service: General;  Laterality: N/A;  . LYSIS OF ADHESION N/A 06/12/2018   Procedure: LYSIS OF ADHESIONS;  Surgeon: [name], MD;  Location: [facility];  Service: General;  Laterality: N/A;  . LYSIS OF ADHESION N/A 03/02/2019   Procedure: Lysis Of Adhesion;  Surgeon: Ralene Ok, MD;  Location: [facility];  Service: General;  Laterality: N/A;  . PORTACATH PLACEMENT Right 03/07/2018   Procedure: INSERTION PORT-A-CATH RIGHT SUBCLAVIAN;  Surgeon: [name], MD;  Location: [facility];  Service: General;  Laterality: Right;  . PROCTOSCOPY N/A 06/12/2018    Procedure: RIGID PROCTOSCOPY;  Surgeon: [name], MD;  Location: [facility];  Service: General;  Laterality: N/A;  . thumb surgery   2018   cyst removal    I have reviewed the social history and family history with the patient and they are unchanged from previous note.  ALLERGIES:  has No Known Allergies.  MEDICATIONS:  Current Outpatient Medications  Medication Sig Dispense Refill  . apixaban (ELIQUIS) 5 MG TABS tablet Take 1 tablet (5 mg total) by mouth 2 (two) times daily. 60 tablet 0  . b complex vitamins tablet Take 1 tablet by mouth daily.    [name] diltiazem (CARDIZEM CD) 240 MG 24 hr capsule Take 240 mg by mouth daily.    [name] docusate sodium (COLACE) 100 MG capsule Take 100 mg by mouth 2 (two) times daily.    [name] levothyroxine (SYNTHROID) 75 MCG tablet Take 1 tablet (75 mcg total) by mouth daily. 90 tablet 1  . Multiple Vitamins-Iron (MULTIVITAMIN/IRON PO) Take 1 tablet by mouth daily.     . prochlorperazine (COMPAZINE) 10 MG tablet Take 1 tablet (10 mg total) by mouth every 6 (six) hours as needed for nausea or vomiting. 30 tablet 0  . tamsulosin (FLOMAX) 0.4 MG CAPS capsule Take 0.4 mg by mouth 2 (two) times daily.      No current facility-administered medications for this visit.   Facility-Administered Medications Ordered in Other Visits  Medication Dose Route Frequency Provider Last Rate Last Admin  . atropine injection 0.5 mg  0.5 mg Intravenous Once PRN [name], MD      .  bevacizumab-awwb (MVASI) 500 mg in sodium chloride 0.9 % 100 mL chemo infusion  5 mg/kg (Treatment Plan Recorded) Intravenous Once [name], MD      . fluorouracil (ADRUCIL) 5,400 mg in sodium chloride 0.9 % 142 mL chemo infusion  2,400 mg/m2 (Treatment Plan Recorded) Intravenous 1 day or 1 dose [name], MD      . irinotecan (CAMPTOSAR) 400 mg in sodium chloride 0.9 % 500 mL chemo infusion  180 mg/m2 (Treatment Plan Recorded) Intravenous Once [name], MD      . leucovorin 900 mg in sodium chloride 0.9 % 250  mL infusion  400 mg/m2 (Treatment Plan Recorded) Intravenous Once [name], MD        PHYSICAL EXAMINATION: ECOG PERFORMANCE STATUS: 1 - Symptomatic but completely ambulatory  Vitals:   04/01/20 1123  BP: 131/86  Pulse: 74  Resp: 18  Temp: 98.6 F (37 C)  SpO2: 97%   Filed Weights   04/01/20 1123  Weight: 230 lb (104.3 kg)    GENERAL:alert, no distress and comfortable SKIN: No rash EYES: sclera clear LUNGS: normal breathing effort HEART: Mild bilateral lower extremity edema at the ankles NEURO: alert & oriented x 3 with fluent speech, normal gait PAC without erythema  LABORATORY DATA:  I have reviewed the data as listed CBC Latest Ref Rng & Units 04/01/2020 03/10/2020 02/25/2020  WBC 4.0 - 10.5 K/uL 6.6 4.4 4.3  Hemoglobin 13.0 - 17.0 g/dL 13.0 12.2(L) 12.3(L)  Hematocrit 39 - 52 % 39.2 36.6(L) 37.0(L)  Platelets 150 - 400 K/uL 227 203 223     CMP Latest Ref Rng & Units 04/01/2020 03/10/2020 02/25/2020  Glucose 70 - 99 mg/dL 95 107(H) 95  BUN 8 - 23 mg/dL _0 Creatinine 0.61 - 1.24 mg/dL 1.29(H) 1.40(H) 1.26(H)  Sodium 135 - 145 mmol/L 140 144 141  Potassium 3.5 - 5.1 mmol/L 4.1 3.7 4.1  Chloride 98 - 111 mmol/L 104 109 107  CO2 22 - 32 mmol/L _1 Calcium 8.9 - 10.3 mg/dL 9.7 8.9 8.9  Total Protein 6.5 - 8.1 g/dL 7.1 6.6 6.8  Total Bilirubin 0.3 - 1.2 mg/dL 0.6 0.5 0.5  Alkaline Phos 38 - 126 U/L 82 83 76  AST 15 - 41 U/L _2 ALT 0 - 44 U/L _3 RADIOGRAPHIC STUDIES: I have personally reviewed the radiological images as listed and agreed with the findings in the report. No results found.   ASSESSMENT & PLAN: Dave Ferris Bennettis a 74y.o.malewith    1. Cancer of left colon,adenocarcinoma, stage IIIB(pT3N1cM0), MSI-stable, KRAS+ -Diagnosed in 01/2018. Treated with surgery andadjuvantFOLFOX. Due to side effects Oxaliplatin was stopped after 3 cycles and chemo was stopped after 6 cycles. He declined completing 6 months of  chemo treatment. -He underwent hernia repair in 02/2019. He recovered well.  -SurveillanceCT CAP from4/28/21shows no definitive evidence of metastasis, except a soft tissue density in between prostate and rectum, indeterminate.  -colonoscopy in December 2019 showed mucosal inflammation in the rectum, biopsy was negative. -Rising CEA -Repeat colonoscopy on 11/02/2019 by Dr. [name] Plan showed normal digital rectal exam, 3 polyps in the rectum, descending colon and cecum, and a prior sigmoid: Anastomosis characterized by erythema.He found an extrinsic nonobstructing medium-sized mass in the proximal rectum about 4 cm in length,no internal rectal mass. -He underwent pelvic MRI on 12/06/2019 which was reviewed in our tumor board, showing a masslike area in the rectum suspicious for rectal  neoplasm, likely T4b based on the appearance of soft tissue extending along the anterior peritoneal reflection and into the seminal vesicles.  -His PET from 12/21/2019 shows peritoneal metastasis, 2 hypermetabolic hepatic metastases, and local recurrence within the left abdominal wall which was palpable on exam 01/28/20. His ultrasound biopsy of the left abdominal wall from 01/15/2020 shows metastatic colorectal adenocarcinoma. -It was discussed that this is recurrent metastatic stage IV colon cancer, not resectable but still treatable. Given his good nutrition and PS he will likely tolerate treatment well. The goal is palliative -Began first line dose-reduced FOLFIRI on 01/28/2020 -FoundationOneshowed Kras (+)positive, he is not a candidate for EGFR inhibitor. Bevacizumab was added with cycle 2  -CEA trending down on treatment, normalized after cycle 4  2. Hypothyroidism -Previously on synthroid, TSH normal 2 years ago. He has not been taking synthroid for a while -repeat TSH today is 47, I spoke to his PCP Dr. Ethlyn Gallery who will call him to discuss further management  3.BPH  -Followed by urologist Dr.  Lovena Neighbours -Pelvic MRI on 7/1 shows marked prostatomegaly with signs of BPH extending into the bladder base -Denies new or worsening urinary symptoms  4. Atrial Fibrillation -continue Eliquis. Rate controlled.He can stop Eliquis periodically for bleeding hemorrhoids -Continue follow-up with cardiology  5.CKD stage III -he has developed mild increased Cr since he started chemo, EGFR around 40-50's -I encouraged him to control his blood pressure, cholesterol and BG. I also encouraged him to drink plenty of water. -Will avoid NSAIDs and CT IV contrast if GFR low  Disposition: Mr. Kinney appears stable.  He completed 4 cycles of FOLFIRI and bevacizumab.  He tolerates treatment very well, with mild fatigue, and diarrhea.  Symptoms are well managed with supportive care at home.  He is able to recover and function well.  We reviewed his CBC and CMP from today which are stable.  The CEA has now normalized after cycle 4, likely indicating a response to treatment.   He will proceed with cycle 5 today as planned, the plan is to restage after cycle 6.  Follow-up in 2 weeks with cycle 6.  Orders Placed This Encounter  Procedures  . CT CHEST ABDOMEN PELVIS W CONTRAST    Standing Status:   Future    Standing Expiration Date:   04/01/2021    Order Specific Question:   If indicated for the ordered procedure, I authorize the administration of contrast media per Radiology protocol    Answer:   Yes    Order Specific Question:   Preferred imaging location?    Answer:   [facility]    Order Specific Question:   Is Oral Contrast requested for this exam?    Answer:   Yes, Per Radiology protocol    Order Specific Question:   Reason for Exam (SYMPTOM  OR DIAGNOSIS REQUIRED)    Answer:   recurrent metastatic colon cancer with liver and peritoneal mets, evaluate response to chemo    All questions were answered. The patient knows to call the clinic with any problems, questions or concerns. No  barriers to learning were detected.     Alla Feeling, NP 04/01/20

## 2020-04-01 ENCOUNTER — Inpatient Hospital Stay: Payer: Commercial Managed Care - PPO

## 2020-04-01 ENCOUNTER — Other Ambulatory Visit: Payer: Self-pay

## 2020-04-01 ENCOUNTER — Inpatient Hospital Stay (HOSPITAL_BASED_OUTPATIENT_CLINIC_OR_DEPARTMENT_OTHER): Payer: Commercial Managed Care - PPO | Admitting: Nurse Practitioner

## 2020-04-01 ENCOUNTER — Encounter: Payer: Self-pay | Admitting: Nurse Practitioner

## 2020-04-01 VITALS — BP 131/86 | HR 74 | Temp 98.6°F | Resp 18 | Ht 70.0 in | Wt 230.0 lb

## 2020-04-01 DIAGNOSIS — C186 Malignant neoplasm of descending colon: Secondary | ICD-10-CM | POA: Diagnosis not present

## 2020-04-01 DIAGNOSIS — Z95828 Presence of other vascular implants and grafts: Secondary | ICD-10-CM

## 2020-04-01 DIAGNOSIS — Z5112 Encounter for antineoplastic immunotherapy: Secondary | ICD-10-CM | POA: Diagnosis not present

## 2020-04-01 DIAGNOSIS — Z7189 Other specified counseling: Secondary | ICD-10-CM

## 2020-04-01 LAB — CMP (CANCER CENTER ONLY)
ALT: 15 U/L (ref 0–44)
AST: 19 U/L (ref 15–41)
Albumin: 3.8 g/dL (ref 3.5–5.0)
Alkaline Phosphatase: 82 U/L (ref 38–126)
Anion gap: 7 (ref 5–15)
BUN: 20 mg/dL (ref 8–23)
CO2: 29 mmol/L (ref 22–32)
Calcium: 9.7 mg/dL (ref 8.9–10.3)
Chloride: 104 mmol/L (ref 98–111)
Creatinine: 1.29 mg/dL — ABNORMAL HIGH (ref 0.61–1.24)
GFR, Estimated: 59 mL/min — ABNORMAL LOW (ref >60)
Glucose, Bld: 95 mg/dL (ref 70–99)
Potassium: 4.1 mmol/L (ref 3.5–5.1)
Sodium: 140 mmol/L (ref 135–145)
Total Bilirubin: 0.6 mg/dL (ref 0.3–1.2)
Total Protein: 7.1 g/dL (ref 6.5–8.1)

## 2020-04-01 LAB — CBC WITH DIFFERENTIAL (CANCER CENTER ONLY)
Abs Immature Granulocytes: 0.02 10*3/uL (ref 0.00–0.07)
Basophils Absolute: 0.1 10*3/uL (ref 0.0–0.1)
Basophils Relative: 1 %
Eosinophils Absolute: 0.2 10*3/uL (ref 0.0–0.5)
Eosinophils Relative: 3 %
HCT: 39.2 % (ref 39.0–52.0)
Hemoglobin: 13 g/dL (ref 13.0–17.0)
Immature Granulocytes: 0 %
Lymphocytes Relative: 28 %
Lymphs Abs: 1.8 10*3/uL (ref 0.7–4.0)
MCH: 30.4 pg (ref 26.0–34.0)
MCHC: 33.2 g/dL (ref 30.0–36.0)
MCV: 91.6 fL (ref 80.0–100.0)
Monocytes Absolute: 0.8 10*3/uL (ref 0.1–1.0)
Monocytes Relative: 12 %
Neutro Abs: 3.7 10*3/uL (ref 1.7–7.7)
Neutrophils Relative %: 56 %
Platelet Count: 227 10*3/uL (ref 150–400)
RBC: 4.28 MIL/uL (ref 4.22–5.81)
RDW: 15.4 % (ref 11.5–15.5)
WBC Count: 6.6 10*3/uL (ref 4.0–10.5)
nRBC: 0 % (ref 0.0–0.2)

## 2020-04-01 LAB — CEA (IN HOUSE-CHCC): CEA (CHCC-In House): 3.59 ng/mL (ref 0.00–5.00)

## 2020-04-01 MED ORDER — PALONOSETRON HCL INJECTION 0.25 MG/5ML
INTRAVENOUS | Status: AC
  Filled 2020-04-01: qty 5

## 2020-04-01 MED ORDER — SODIUM CHLORIDE 0.9 % IV SOLN
400.0000 mg/m2 | Freq: Once | INTRAVENOUS | Status: AC
  Administered 2020-04-01: 900 mg via INTRAVENOUS
  Filled 2020-04-01: qty 45

## 2020-04-01 MED ORDER — ATROPINE SULFATE 1 MG/ML IJ SOLN
0.5000 mg | Freq: Once | INTRAMUSCULAR | Status: AC | PRN
  Administered 2020-04-01: 0.5 mg via INTRAVENOUS

## 2020-04-01 MED ORDER — SODIUM CHLORIDE 0.9 % IV SOLN
180.0000 mg/m2 | Freq: Once | INTRAVENOUS | Status: AC
  Administered 2020-04-01: 400 mg via INTRAVENOUS
  Filled 2020-04-01: qty 15

## 2020-04-01 MED ORDER — SODIUM CHLORIDE 0.9% FLUSH
10.0000 mL | INTRAVENOUS | Status: DC | PRN
  Administered 2020-04-01: 10 mL
  Filled 2020-04-01: qty 10

## 2020-04-01 MED ORDER — ATROPINE SULFATE 1 MG/ML IJ SOLN
INTRAMUSCULAR | Status: AC
  Filled 2020-04-01: qty 1

## 2020-04-01 MED ORDER — SODIUM CHLORIDE 0.9 % IV SOLN
5.0000 mg/kg | Freq: Once | INTRAVENOUS | Status: AC
  Administered 2020-04-01: 500 mg via INTRAVENOUS
  Filled 2020-04-01: qty 16

## 2020-04-01 MED ORDER — SODIUM CHLORIDE 0.9 % IV SOLN
2400.0000 mg/m2 | INTRAVENOUS | Status: DC
  Administered 2020-04-01: 5400 mg via INTRAVENOUS
  Filled 2020-04-01: qty 108

## 2020-04-01 MED ORDER — SODIUM CHLORIDE 0.9 % IV SOLN
Freq: Once | INTRAVENOUS | Status: AC
  Filled 2020-04-01: qty 250

## 2020-04-01 MED ORDER — SODIUM CHLORIDE 0.9 % IV SOLN
10.0000 mg | Freq: Once | INTRAVENOUS | Status: AC
  Administered 2020-04-01: 10 mg via INTRAVENOUS
  Filled 2020-04-01: qty 10

## 2020-04-01 MED ORDER — PALONOSETRON HCL INJECTION 0.25 MG/5ML
0.2500 mg | Freq: Once | INTRAVENOUS | Status: AC
  Administered 2020-04-01: 0.25 mg via INTRAVENOUS

## 2020-04-01 NOTE — Patient Instructions (Signed)
[facility] Discharge Instructions for Patients Receiving Chemotherapy  Today you received the following chemotherapy agents bevacizumab, irinotecan, leucovorin, flourouracil.   To help prevent nausea and vomiting after your treatment, we encourage you to take your nausea medication as directed.    If you develop nausea and vomiting that is not controlled by your nausea medication, call the clinic.   BELOW ARE SYMPTOMS THAT SHOULD BE REPORTED IMMEDIATELY:  *FEVER GREATER THAN 100.5 F  *CHILLS WITH OR WITHOUT FEVER  NAUSEA AND VOMITING THAT IS NOT CONTROLLED WITH YOUR NAUSEA MEDICATION  *UNUSUAL SHORTNESS OF BREATH  *UNUSUAL BRUISING OR BLEEDING  TENDERNESS IN MOUTH AND THROAT WITH OR WITHOUT PRESENCE OF ULCERS  *URINARY PROBLEMS  *BOWEL PROBLEMS  UNUSUAL RASH Items with * indicate a potential emergency and should be followed up as soon as possible.  Feel free to call the clinic should you have any questions or concerns. The clinic phone number is [phone].  Please show the [facility] at check-in to the Emergency Department and triage nurse.

## 2020-04-03 ENCOUNTER — Other Ambulatory Visit: Payer: Self-pay

## 2020-04-03 ENCOUNTER — Inpatient Hospital Stay: Payer: Commercial Managed Care - PPO

## 2020-04-03 ENCOUNTER — Telehealth: Payer: Self-pay | Admitting: Nurse Practitioner

## 2020-04-03 VITALS — BP 121/91 | HR 89 | Temp 98.5°F | Resp 18

## 2020-04-03 DIAGNOSIS — Z5112 Encounter for antineoplastic immunotherapy: Secondary | ICD-10-CM | POA: Diagnosis not present

## 2020-04-03 DIAGNOSIS — C186 Malignant neoplasm of descending colon: Secondary | ICD-10-CM

## 2020-04-03 DIAGNOSIS — Z7189 Other specified counseling: Secondary | ICD-10-CM

## 2020-04-03 MED ORDER — SODIUM CHLORIDE 0.9% FLUSH
10.0000 mL | INTRAVENOUS | Status: DC | PRN
  Administered 2020-04-03: 10 mL
  Filled 2020-04-03: qty 10

## 2020-04-03 MED ORDER — HEPARIN SOD (PORK) LOCK FLUSH 100 UNIT/ML IV SOLN
500.0000 [IU] | Freq: Once | INTRAVENOUS | Status: AC | PRN
  Administered 2020-04-03: 500 [IU]
  Filled 2020-04-03: qty 5

## 2020-04-03 NOTE — Patient Instructions (Signed)

## 2020-04-03 NOTE — Telephone Encounter (Signed)
No 10/26 los 

## 2020-04-04 ENCOUNTER — Ambulatory Visit: Payer: Commercial Managed Care - PPO | Admitting: Cardiology

## 2020-04-07 ENCOUNTER — Other Ambulatory Visit: Payer: Commercial Managed Care - PPO

## 2020-04-07 ENCOUNTER — Ambulatory Visit: Payer: Commercial Managed Care - PPO

## 2020-04-07 ENCOUNTER — Ambulatory Visit: Payer: Commercial Managed Care - PPO | Admitting: Hematology

## 2020-04-11 NOTE — Progress Notes (Signed)
[place]   [phone] [phone]   Clinic Follow up Note   Patient Care Team: [name], MD as PCP - General (Family Medicine) [name], MD as Consulting Physician (Cardiology) [name], MD as Consulting Physician (Gastroenterology) [name], MD as Consulting Physician (General Surgery) [name], MD as Consulting Physician (General Surgery) [name], MD as Consulting Physician (Urology) [name], MD as Consulting Physician (Medical Oncology)  Date of Service:  04/14/2020  CHIEF COMPLAINT: F/u on colon cancer  SUMMARY OF ONCOLOGIC HISTORY: Oncology History Overview Note  Cancer Staging Cancer of left colon [facility]) Staging form: Colon and Rectum, AJCC 8th Edition - Pathologic stage from 01/11/2018: Stage IIIB (pT3, pN1c, cM0) - Signed by [name], MD on 01/16/2018     Cancer of left colon ([place])  01/10/2018 Imaging   CT AP W Contrast 01/10/18  IMPRESSION: Irregular soft tissue density causing stricture of the mid descending colon likely the site of obstruction for the dilated small bowel. 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 No evidence of perforation.  Equivocal findings involving the appendix measuring 1.2 cm at the appendiceal tip with mucosal enhancement. No adjacent free fluid or inflammatory change. Findings are nonspecific, but can be seen with early acute appendicitis.  Mild prostatic enlargement. Increased density over the posterior bladder base likely due to the large prostatic impression although cannot completely exclude a bladder mass. Urology protocol CT or ultrasound may be helpful for better evaluation.  Mild cholelithiasis.  Stable 1.5 cm cystic structure over the lower pole right kidney likely slightly hyperdense cyst.  Diverticulosis of the colon.  Aortic Atherosclerosis (ICD10-I70.0).     01/11/2018 Cancer Staging   Staging form: Colon and Rectum, AJCC  8th Edition - Pathologic stage from 01/11/2018: Stage IIIB (pT3, pN1c, cM0) - Signed by [name], MD on 01/16/2018   01/11/2018 Surgery   LEFT COLON RESECTION, TAKEDOWN SPLENIC FLEXURE, COLOSTOMY by Dr. [name]    01/11/2018 Procedure   Colonoscopy 01/11/18 by Dr. [name]  - Malignant completely obstructing tumor in the mid descending colon. Tattooed. - Diverticulosis in the sigmoid colon. - Non-bleeding internal hemorrhoids. - No specimens collected.   01/11/2018 Pathology Results   Diagnosis 01/11/18  1. Colon, segmental resection for tumor, descending colon - INVASIVE COLORECTAL ADENOCARCINOMA, 4 CM. - TUMOR EXTENDS INTO PERICOLONIC CONNECTIVE TISSUE. - TUMOR FOCALLY INVOLVES RADIAL MARGIN. - ONE MESENTERIC TUMOR DEPOSIT. - THIRTEEN BENIGN LYMPH NODES (0/13). 2. Colon, segmental resection, splenic flexure - BENIGN COLON. - NO EVIDENCE OF MALIGNANCY .   01/11/2018 Tumor Marker   Baseline CEA at 3.4   01/16/2018 Initial Diagnosis   Cancer of left colon ([place])   01/23/2018 Imaging   CT CHEST WO CONTRAST IMPRESSION: 1. No evidence for metastatic disease within the chest. 2. Small left pleural effusion with underlying opacities which may represent atelectasis. Right basilar atelectasis. 3. Few foci of gas within the upper abdomen in the omentum with surrounding fat stranding, likely postsurgical 4. Aortic Atherosclerosis (ICD10-I70.0).   03/08/2018 - 05/15/2018 Chemotherapy   adjuvant FOLOFX. Due to side effects of neuropahty Oxaliplatin was stopped after 3 cycles and chemo was stopped after 6 cycles. He declined completing 6 months of chemo treatment.     03/19/2018 Imaging   03/19/2018 CT AP IMPRESSION: 1. Interval partial left hemicolectomy and descending colostomy. 2. Heterogeneous soft tissue density along the left anterior renal fascia is most likely postoperative (favor fat necrosis). No well-defined fluid collection. 3. Mild left lower quadrant  edema, new since 01/10/2018. This  could be postoperative. Superimposed sigmoid diverticulitis and/or cystitis cannot be excluded. 4. Subtle hyperenhancing nodule within the anterior bladder dome cannot be excluded. Consider nonemergent cystoscopy. When this is performed, recommend attention to the left ureterovesicular junction and distal left ureter to evaluate questionable soft tissue fullness. 5. Cholelithiasis. 6.  Aortic Atherosclerosis (ICD10-I70.0). 7. Prostatomegaly.   11/13/2018 Imaging   CT CAP WO Contrast 11/13/18  IMPRESSION: 1. Reversal of left lower quadrant colostomy with sigmoid colon anastomosis. No complicating features. No findings for residual or recurrent tumor or metastatic disease involving the chest, abdomen or pelvis without contrast. 2. No acute abdominal/pelvic findings. 3. Gallbladder sludge and gallstones but no findings for acute cholecystitis. 4. Stable anterior abdominal wall hernia. 5. The right testicle is in the right inguinal canal.   10/03/2019 Imaging   CT CAP WO contrast  IMPRESSION: 1. New rounded density interposed between the prostate gland and anterior upper rectal wall could represent adenopathy or local extension of anterior rectal tumor. 2. Marked prostatomegaly, prostate volume 150 cubic cm. 3. Other imaging findings of potential clinical significance: Aortic Atherosclerosis (ICD10-I70.0). Coronary atherosclerosis. Trace right pleural effusion. Suspected cholelithiasis. Nonobstructive left nephrolithiasis. Multilevel lumbar impingement. Bilateral mildly retracted testicles. Hypodense exophytic lesion of the right kidney, most likely to be a cyst.   11/02/2019 Procedure   colonoscopy on 11/02/2019 by Dr. [name] Plan showed normal digital rectal exam, 3 polyps in the rectum, descending colon and cecum, and a prior sigmoid: Anastomosis characterized by erythema.  He found an extrinsic nonobstructing medium-sized mass in the proximal rectum about 4 cm in length, no internal  rectal mass.   Diagnosis Surgical [P], colon, cecum, descending, rectal, polyp (3) - TUBULAR ADENOMA (TWO) - NO HIGH GRADE DYSPLASIA OR CARCINOMA. - COLONIC FRAGMENT WITH BENIGN LYMPHOID AGGREGATE.   12/07/2019 Imaging   MRI pelvis IMPRESSION: 1. Masslike area in the rectum suspicious for rectal neoplasm, likely T4b based on the appearance of soft tissue extending along the anterior peritoneal reflection and into the seminal vesicles. Correlation with recent colonoscopy results may be helpful. Area of anastomosis and other areas of the pelvis are not imaged on today's exam.   12/21/2019 PET scan   IMPRESSION: 1. Unfortunately evidence for peritoneal metastasis. Intensely hypermetabolic nodules along the LEFT pericolic gutter. Favor hypermetabolic mass anterior to the rectum to represent serosal implant along the ventral surface of the rectum. 2. local recurrence within the LEFT abdominal wall at site prior colostomy. Intense hypermetabolic thickening of the rectus muscle at this site. 3. Two hypermetabolic hepatic metastasis.   01/15/2020 Relapse/Recurrence   FINAL MICROSCOPIC DIAGNOSIS:   A. SOFT TISSUE, LEFT ABDOMINAL WALL, BIOPSY:  - Adenocarcinoma.  - See comment.   COMMENT:   The morphology is consistent with metastatic colorectal adenocarcinoma.    01/28/2020 -  Chemotherapy   First line FOLFIRI q2weeks starting 01/28/20.Avastin added with C2.       CURRENT THERAPY:  First line FOLFIRI q2weeks starting 01/28/20. Bevacizumab added with C2.  INTERVAL HISTORY:  Dave Vaughn is here for a follow up. He presents to the clinic with his wife. He notes he is doing well. He notes he continues to lose his hair. He notes diarrhea with chemo. He has been using imodium which controls this.     REVIEW OF SYSTEMS:   Constitutional: Denies fevers, chills or abnormal weight loss Eyes: Denies blurriness of vision Ears, nose, mouth, throat, and face: Denies mucositis or sore  throat Respiratory: Denies cough, dyspnea  or wheezes Cardiovascular: Denies palpitation, chest discomfort or lower extremity swelling Gastrointestinal:  Denies nausea, heartburn or change in bowel habits Skin: Denies abnormal skin rashes Lymphatics: Denies new lymphadenopathy or easy bruising Neurological:Denies numbness, tingling or new weaknesses Behavioral/Psych: Mood is stable, no new changes  All other systems were reviewed with the patient and are negative.   MEDICAL HISTORY:  Past Medical History:  Diagnosis Date  . Adenocarcinoma, colon ([place]) dx'd 01/2018  . Anemia    taking iron supplements  . Anxiety   . Atrial fibrillation with RVR ([place])   . Colonic obstruction ([place]) 01/10/2018  . Depression   . Diverticulitis   . Dysrhythmia    afib  . History of kidney stones     SURGICAL HISTORY: Past Surgical History:  Procedure Laterality Date  . ANKLE SURGERY Left    when he was in college  . COLON RESECTION N/A 01/11/2018   Procedure: LEFT COLON RESECTION, TAKEDOWN SPLENIC FLEXURE, COLOSTOMY;  Surgeon: [name], MD;  Location: [facility];  Service: General;  Laterality: N/A;  . COLONOSCOPY  01/11/2018   Procedure: COLONOSCOPY;  Surgeon: Jackquline Denmark, MD;  Location: [facility];  Service: Endoscopy;;  . COLONOSCOPY  05/10/2018  . colonscopy  05/10/2018  . HERNIA REPAIR    . HERNIA REPAIR  03/02/2019   EXPLORATORY LAPAROTOMY (N/A Abdomen)  . INCISIONAL HERNIA REPAIR N/A 03/02/2019   Procedure: INCISIONAL HERNIA REPAIR , RECTORECTUS VS TAR HERNIA REPAIR;  Surgeon: Ralene Ok, MD;  Location: [facility];  Service: General;  Laterality: N/A;  . INSERTION OF MESH N/A 03/02/2019   Procedure: Insertion Of Mesh;  Surgeon: Ralene Ok, MD;  Location: [place];  Service: General;  Laterality: N/A;  . LAPAROTOMY N/A 03/02/2019   Procedure: EXPLORATORY LAPAROTOMY;  Surgeon: Ralene Ok, MD;  Location: [facility];  Service: General;  Laterality: N/A;  . LYSIS OF ADHESION N/A 06/12/2018    Procedure: LYSIS OF ADHESIONS;  Surgeon: [name], MD;  Location: [facility];  Service: General;  Laterality: N/A;  . LYSIS OF ADHESION N/A 03/02/2019   Procedure: Lysis Of Adhesion;  Surgeon: Ralene Ok, MD;  Location: [place];  Service: General;  Laterality: N/A;  . PORTACATH PLACEMENT Right 03/07/2018   Procedure: INSERTION PORT-A-CATH RIGHT SUBCLAVIAN;  Surgeon: [name], MD;  Location: [place];  Service: General;  Laterality: Right;  . PROCTOSCOPY N/A 06/12/2018   Procedure: RIGID PROCTOSCOPY;  Surgeon: [name], MD;  Location: [facility];  Service: General;  Laterality: N/A;  . thumb surgery   2018   cyst removal    I have reviewed the social history and family history with the patient and they are unchanged from previous note.  ALLERGIES:  has No Known Allergies.  MEDICATIONS:  Current Outpatient Medications  Medication Sig Dispense Refill  . apixaban (ELIQUIS) 5 MG TABS tablet Take 1 tablet (5 mg total) by mouth 2 (two) times daily. 60 tablet 0  . b complex vitamins tablet Take 1 tablet by mouth daily.    [name] diltiazem (CARDIZEM CD) 240 MG 24 hr capsule Take 240 mg by mouth daily.    [name] docusate sodium (COLACE) 100 MG capsule Take 100 mg by mouth 2 (two) times daily.    [name] levothyroxine (SYNTHROID) 75 MCG tablet Take 1 tablet (75 mcg total) by mouth daily. 90 tablet 1  . Multiple Vitamins-Iron (MULTIVITAMIN/IRON PO) Take 1 tablet by mouth daily.     . prochlorperazine (COMPAZINE) 10 MG tablet Take 1 tablet (10 mg total) by  mouth every 6 (six) hours as needed for nausea or vomiting. 30 tablet 0  . tamsulosin (FLOMAX) 0.4 MG CAPS capsule Take 0.4 mg by mouth 2 (two) times daily.      No current facility-administered medications for this visit.   Facility-Administered Medications Ordered in Other Visits  Medication Dose Route Frequency Provider Last Rate Last Admin  . fluorouracil (ADRUCIL) 5,400 mg in sodium chloride 0.9 % 142 mL chemo infusion  2,400 mg/m2 (Treatment Plan  Recorded) Intravenous 1 day or 1 dose [name], MD      . heparin lock flush 100 unit/mL  500 Units Intracatheter Once PRN [name], MD      . irinotecan (CAMPTOSAR) 400 mg in sodium chloride 0.9 % 500 mL chemo infusion  180 mg/m2 (Treatment Plan Recorded) Intravenous Once [name], MD 347 mL/hr at 04/14/20 1304 400 mg at 04/14/20 1304  . leucovorin 900 mg in sodium chloride 0.9 % 250 mL infusion  400 mg/m2 (Treatment Plan Recorded) Intravenous Once [name], MD 197 mL/hr at 04/14/20 1307 900 mg at 04/14/20 1307  . sodium chloride flush (NS) 0.9 % injection 10 mL  10 mL Intracatheter PRN [name], MD        PHYSICAL EXAMINATION: ECOG PERFORMANCE STATUS: 1 - Symptomatic but completely ambulatory  Vitals:   04/14/20 1028  BP: 110/83  Pulse: 84  Resp: 18  Temp: 98.1 F (36.7 C)  SpO2: 98%   Filed Weights   04/14/20 1028  Weight: 228 lb 6.4 oz (103.6 kg)    Due to COVID19 we will limit examination to appearance. Patient had no complaints.  GENERAL:alert, no distress and comfortable SKIN: skin color normal, no rashes or significant lesions EYES: normal, Conjunctiva are pink and non-injected, sclera clear  NEURO: alert & oriented x 3 with fluent speech   LABORATORY DATA:  I have reviewed the data as listed CBC Latest Ref Rng & Units 04/14/2020 04/01/2020 03/10/2020  WBC 4.0 - 10.5 K/uL 4.6 6.6 4.4  Hemoglobin 13.0 - 17.0 g/dL 12.6(L) 13.0 12.2(L)  Hematocrit 39 - 52 % 37.6(L) 39.2 36.6(L)  Platelets 150 - 400 K/uL 261 227 203     CMP Latest Ref Rng & Units 04/14/2020 04/01/2020 03/10/2020  Glucose 70 - 99 mg/dL 98 95 107(H)  BUN 8 - 23 mg/dL 22 20 18   Creatinine 0.61 - 1.24 mg/dL 1.29(H) 1.29(H) 1.40(H)  Sodium 135 - 145 mmol/L 142 140 144  Potassium 3.5 - 5.1 mmol/L 4.2 4.1 3.7  Chloride 98 - 111 mmol/L 109 104 109  CO2 22 - 32 mmol/L 24 29 26   Calcium 8.9 - 10.3 mg/dL 8.9 9.7 8.9  Total Protein 6.5 - 8.1 g/dL 6.5 7.1 6.6  Total Bilirubin 0.3 - 1.2 mg/dL 0.5 0.6 0.5    Alkaline Phos 38 - 126 U/L 80 82 83  AST 15 - 41 U/L 18 19 17   ALT 0 - 44 U/L 15 15 19       RADIOGRAPHIC STUDIES: I have personally reviewed the radiological images as listed and agreed with the findings in the report. No results found.   ASSESSMENT & PLAN:  Dave Vaughn is a 74 y.o. male with    1. Cancer of left colon,adenocarcinoma, stage IIIB(pT3N1cM0), MSI-stable, liver and peritoneal recurrence 12/2019 -Diagnosed in 01/2018. Treated with surgery andadjuvantFOLFOX. Due to side effects Oxaliplatin was stopped after 3 cycles and chemo was stopped after 6 cycles. He declined completing 6 months of chemo treatment. -Unfortunately he had local recurrence  in left abdominal wall withperitoneal metastasis,Two hypermetabolic hepatic metastasisin 12/2019.HisUS Biopsyof abdominal wallfrom 01/2020 confirmed metastatic colorectal adenocarcinoma. -His 01/15/20 FO results showKras mutation, MSS, he is not a candidate for EGFR inhibitor or immunotherapy -I started him on first-line FOLFIRI q2weeks on 01/28/20.Bevacizumab (MVASI) was added with C2.If disease well controlled afterat least 6 monthsor poor tolerance, we can switch to maintenance chemo with 5FU or oral Xeloda.I discussed role of target therapy with RT or Surgery if indicated.Due to his both peritoneal and liver metastasis, unfortunately he is probably not a candidate for surgery such as HIPEC -S/p C6 he continues to have diarrhea, which is controlled on imodium. He is fatigued but continues to work through chemo.  -Labs reviewed and adequate to proceed with C6 FOLFIRI And bevacizumab today  -Plan to proceed with scan in 1-2 weeks. F/u in 2 weeks     2. Atrial Fibrillation -continue Eliquis. Continue follow-up with cardiologyDr Hawani  3.CKD stage III -Will avoid NSAIDs and CT IV contrast if GFR low -Will monitor on chemo.  4.BPH  -Followed by urologist Dr. Lovena Neighbours -Pelvic MRI on 7/1 shows marked  prostatomegaly with signs of BPH extending into the bladder base  5.Goal of care discussion  -The patient understands the goal of care is palliative. -He is full code now  6. Elevated TSH  -He is on Synthroid since 02/16/20.   Plan -Labs reviewed and adequate to proceed with C6 FOLFIRI and Bevacizumab today, same dose -Lab, flush, f/u and chemo on 11/22, 12/8, 12/27 -CT CAP w contrast in 1-2 weeks    No problem-specific Assessment & Plan notes found for this encounter.   No orders of the defined types were placed in this encounter.  All questions were answered. The patient knows to call the clinic with any problems, questions or concerns. No barriers to learning was detected. The total time spent in the appointment was 30 minutes.     [name], MD 04/14/2020   I, Joslyn Devon, am acting as scribe for [name], MD.   I have reviewed the above documentation for accuracy and completeness, and I agree with the above.

## 2020-04-14 ENCOUNTER — Inpatient Hospital Stay: Payer: Commercial Managed Care - PPO

## 2020-04-14 ENCOUNTER — Encounter: Payer: Self-pay | Admitting: Hematology

## 2020-04-14 ENCOUNTER — Telehealth: Payer: Self-pay | Admitting: Hematology

## 2020-04-14 ENCOUNTER — Other Ambulatory Visit: Payer: Self-pay

## 2020-04-14 ENCOUNTER — Inpatient Hospital Stay (HOSPITAL_BASED_OUTPATIENT_CLINIC_OR_DEPARTMENT_OTHER): Payer: Commercial Managed Care - PPO | Admitting: Hematology

## 2020-04-14 ENCOUNTER — Inpatient Hospital Stay: Payer: Commercial Managed Care - PPO | Attending: Nurse Practitioner

## 2020-04-14 VITALS — BP 110/83 | HR 84 | Temp 98.1°F | Resp 18 | Ht 70.0 in | Wt 228.4 lb

## 2020-04-14 DIAGNOSIS — C786 Secondary malignant neoplasm of retroperitoneum and peritoneum: Secondary | ICD-10-CM | POA: Diagnosis not present

## 2020-04-14 DIAGNOSIS — Z5111 Encounter for antineoplastic chemotherapy: Secondary | ICD-10-CM | POA: Diagnosis present

## 2020-04-14 DIAGNOSIS — I4891 Unspecified atrial fibrillation: Secondary | ICD-10-CM | POA: Insufficient documentation

## 2020-04-14 DIAGNOSIS — R197 Diarrhea, unspecified: Secondary | ICD-10-CM | POA: Diagnosis not present

## 2020-04-14 DIAGNOSIS — C186 Malignant neoplasm of descending colon: Secondary | ICD-10-CM

## 2020-04-14 DIAGNOSIS — C787 Secondary malignant neoplasm of liver and intrahepatic bile duct: Secondary | ICD-10-CM | POA: Diagnosis not present

## 2020-04-14 DIAGNOSIS — R110 Nausea: Secondary | ICD-10-CM | POA: Insufficient documentation

## 2020-04-14 DIAGNOSIS — Z452 Encounter for adjustment and management of vascular access device: Secondary | ICD-10-CM | POA: Insufficient documentation

## 2020-04-14 DIAGNOSIS — R946 Abnormal results of thyroid function studies: Secondary | ICD-10-CM | POA: Insufficient documentation

## 2020-04-14 DIAGNOSIS — Z5112 Encounter for antineoplastic immunotherapy: Secondary | ICD-10-CM | POA: Diagnosis present

## 2020-04-14 DIAGNOSIS — N400 Enlarged prostate without lower urinary tract symptoms: Secondary | ICD-10-CM | POA: Insufficient documentation

## 2020-04-14 DIAGNOSIS — N183 Chronic kidney disease, stage 3 unspecified: Secondary | ICD-10-CM | POA: Insufficient documentation

## 2020-04-14 DIAGNOSIS — Z7189 Other specified counseling: Secondary | ICD-10-CM

## 2020-04-14 DIAGNOSIS — Z95828 Presence of other vascular implants and grafts: Secondary | ICD-10-CM

## 2020-04-14 LAB — CBC WITH DIFFERENTIAL (CANCER CENTER ONLY)
Abs Immature Granulocytes: 0 10*3/uL (ref 0.00–0.07)
Basophils Absolute: 0 10*3/uL (ref 0.0–0.1)
Basophils Relative: 1 %
Eosinophils Absolute: 0.1 10*3/uL (ref 0.0–0.5)
Eosinophils Relative: 3 %
HCT: 37.6 % — ABNORMAL LOW (ref 39.0–52.0)
Hemoglobin: 12.6 g/dL — ABNORMAL LOW (ref 13.0–17.0)
Immature Granulocytes: 0 %
Lymphocytes Relative: 39 %
Lymphs Abs: 1.8 10*3/uL (ref 0.7–4.0)
MCH: 30.9 pg (ref 26.0–34.0)
MCHC: 33.5 g/dL (ref 30.0–36.0)
MCV: 92.2 fL (ref 80.0–100.0)
Monocytes Absolute: 0.4 10*3/uL (ref 0.1–1.0)
Monocytes Relative: 9 %
Neutro Abs: 2.3 10*3/uL (ref 1.7–7.7)
Neutrophils Relative %: 48 %
Platelet Count: 261 10*3/uL (ref 150–400)
RBC: 4.08 MIL/uL — ABNORMAL LOW (ref 4.22–5.81)
RDW: 15.2 % (ref 11.5–15.5)
WBC Count: 4.6 10*3/uL (ref 4.0–10.5)
nRBC: 0 % (ref 0.0–0.2)

## 2020-04-14 LAB — CMP (CANCER CENTER ONLY)
ALT: 15 U/L (ref 0–44)
AST: 18 U/L (ref 15–41)
Albumin: 3.5 g/dL (ref 3.5–5.0)
Alkaline Phosphatase: 80 U/L (ref 38–126)
Anion gap: 9 (ref 5–15)
BUN: 22 mg/dL (ref 8–23)
CO2: 24 mmol/L (ref 22–32)
Calcium: 8.9 mg/dL (ref 8.9–10.3)
Chloride: 109 mmol/L (ref 98–111)
Creatinine: 1.29 mg/dL — ABNORMAL HIGH (ref 0.61–1.24)
GFR, Estimated: 59 mL/min — ABNORMAL LOW (ref >60)
Glucose, Bld: 98 mg/dL (ref 70–99)
Potassium: 4.2 mmol/L (ref 3.5–5.1)
Sodium: 142 mmol/L (ref 135–145)
Total Bilirubin: 0.5 mg/dL (ref 0.3–1.2)
Total Protein: 6.5 g/dL (ref 6.5–8.1)

## 2020-04-14 LAB — TOTAL PROTEIN, URINE DIPSTICK

## 2020-04-14 LAB — CEA (IN HOUSE-CHCC): CEA (CHCC-In House): 4.57 ng/mL (ref 0.00–5.00)

## 2020-04-14 MED ORDER — ATROPINE SULFATE 1 MG/ML IJ SOLN
INTRAMUSCULAR | Status: AC
  Filled 2020-04-14: qty 1

## 2020-04-14 MED ORDER — SODIUM CHLORIDE 0.9 % IV SOLN
2400.0000 mg/m2 | INTRAVENOUS | Status: DC
  Administered 2020-04-14: 5400 mg via INTRAVENOUS
  Filled 2020-04-14: qty 108

## 2020-04-14 MED ORDER — SODIUM CHLORIDE 0.9 % IV SOLN
Freq: Once | INTRAVENOUS | Status: AC
  Filled 2020-04-14: qty 250

## 2020-04-14 MED ORDER — SODIUM CHLORIDE 0.9% FLUSH
10.0000 mL | INTRAVENOUS | Status: DC | PRN
  Administered 2020-04-14: 10 mL
  Filled 2020-04-14: qty 10

## 2020-04-14 MED ORDER — PALONOSETRON HCL INJECTION 0.25 MG/5ML
INTRAVENOUS | Status: AC
  Filled 2020-04-14: qty 5

## 2020-04-14 MED ORDER — HEPARIN SOD (PORK) LOCK FLUSH 100 UNIT/ML IV SOLN
500.0000 [IU] | Freq: Once | INTRAVENOUS | Status: DC | PRN
  Filled 2020-04-14: qty 5

## 2020-04-14 MED ORDER — SODIUM CHLORIDE 0.9% FLUSH
10.0000 mL | INTRAVENOUS | Status: DC | PRN
  Filled 2020-04-14: qty 10

## 2020-04-14 MED ORDER — SODIUM CHLORIDE 0.9 % IV SOLN
5.0000 mg/kg | Freq: Once | INTRAVENOUS | Status: AC
  Administered 2020-04-14: 500 mg via INTRAVENOUS
  Filled 2020-04-14: qty 16

## 2020-04-14 MED ORDER — SODIUM CHLORIDE 0.9 % IV SOLN
400.0000 mg/m2 | Freq: Once | INTRAVENOUS | Status: AC
  Administered 2020-04-14: 900 mg via INTRAVENOUS
  Filled 2020-04-14: qty 45

## 2020-04-14 MED ORDER — ATROPINE SULFATE 1 MG/ML IJ SOLN
0.5000 mg | Freq: Once | INTRAMUSCULAR | Status: AC | PRN
  Administered 2020-04-14: 0.5 mg via INTRAVENOUS

## 2020-04-14 MED ORDER — SODIUM CHLORIDE 0.9 % IV SOLN
10.0000 mg | Freq: Once | INTRAVENOUS | Status: AC
  Administered 2020-04-14: 10 mg via INTRAVENOUS
  Filled 2020-04-14: qty 10

## 2020-04-14 MED ORDER — PALONOSETRON HCL INJECTION 0.25 MG/5ML
0.2500 mg | Freq: Once | INTRAVENOUS | Status: AC
  Administered 2020-04-14: 0.25 mg via INTRAVENOUS

## 2020-04-14 MED ORDER — SODIUM CHLORIDE 0.9 % IV SOLN
180.0000 mg/m2 | Freq: Once | INTRAVENOUS | Status: AC
  Administered 2020-04-14: 400 mg via INTRAVENOUS
  Filled 2020-04-14: qty 15

## 2020-04-14 NOTE — Telephone Encounter (Signed)
Scheduled per los. Declined printout  

## 2020-04-14 NOTE — Patient Instructions (Signed)

## 2020-04-14 NOTE — Patient Instructions (Signed)
[facility] Discharge Instructions for Patients Receiving Chemotherapy  Today you received the following chemotherapy agents bevacizumab, irinotecan, leucovorin, flourouracil.   To help prevent nausea and vomiting after your treatment, we encourage you to take your nausea medication as directed.    If you develop nausea and vomiting that is not controlled by your nausea medication, call the clinic.   BELOW ARE SYMPTOMS THAT SHOULD BE REPORTED IMMEDIATELY:  *FEVER GREATER THAN 100.5 F  *CHILLS WITH OR WITHOUT FEVER  NAUSEA AND VOMITING THAT IS NOT CONTROLLED WITH YOUR NAUSEA MEDICATION  *UNUSUAL SHORTNESS OF BREATH  *UNUSUAL BRUISING OR BLEEDING  TENDERNESS IN MOUTH AND THROAT WITH OR WITHOUT PRESENCE OF ULCERS  *URINARY PROBLEMS  *BOWEL PROBLEMS  UNUSUAL RASH Items with * indicate a potential emergency and should be followed up as soon as possible.  Feel free to call the clinic should you have any questions or concerns. The clinic phone number is [phone].  Please show the [facility] at check-in to the Emergency Department and triage nurse.

## 2020-04-14 NOTE — Progress Notes (Signed)
I left a detailed message for Dave Vaughn regarding his Ct scan.  This has been scheduled for 04/22/2020 at [facility], arrive at [place] [place] at 1530.  NPO after 1130, first drink at 1330, second drink at 1430.  I encouraged him to call with any questions.

## 2020-04-16 ENCOUNTER — Ambulatory Visit: Payer: Commercial Managed Care - PPO | Admitting: Cardiology

## 2020-04-16 ENCOUNTER — Inpatient Hospital Stay: Payer: Commercial Managed Care - PPO

## 2020-04-16 ENCOUNTER — Other Ambulatory Visit: Payer: Self-pay

## 2020-04-16 DIAGNOSIS — Z7189 Other specified counseling: Secondary | ICD-10-CM

## 2020-04-16 DIAGNOSIS — C186 Malignant neoplasm of descending colon: Secondary | ICD-10-CM

## 2020-04-16 DIAGNOSIS — Z5112 Encounter for antineoplastic immunotherapy: Secondary | ICD-10-CM | POA: Diagnosis not present

## 2020-04-16 MED ORDER — HEPARIN SOD (PORK) LOCK FLUSH 100 UNIT/ML IV SOLN
500.0000 [IU] | Freq: Once | INTRAVENOUS | Status: AC | PRN
  Administered 2020-04-16: 500 [IU]
  Filled 2020-04-16: qty 5

## 2020-04-16 MED ORDER — SODIUM CHLORIDE 0.9% FLUSH
10.0000 mL | INTRAVENOUS | Status: DC | PRN
  Administered 2020-04-16: 10 mL
  Filled 2020-04-16: qty 10

## 2020-04-16 NOTE — Patient Instructions (Signed)

## 2020-04-22 ENCOUNTER — Other Ambulatory Visit: Payer: Self-pay | Admitting: Hematology

## 2020-04-22 ENCOUNTER — Ambulatory Visit (HOSPITAL_COMMUNITY): Admission: RE | Admit: 2020-04-22 | Payer: Commercial Managed Care - PPO | Source: Ambulatory Visit

## 2020-04-22 ENCOUNTER — Telehealth: Payer: Self-pay | Admitting: *Deleted

## 2020-04-22 MED ORDER — DIPHENOXYLATE-ATROPINE 2.5-0.025 MG PO TABS
1.0000 | ORAL_TABLET | Freq: Four times a day (QID) | ORAL | 0 refills | Status: DC | PRN
Start: 2020-04-22 — End: 2020-05-06

## 2020-04-22 NOTE — Telephone Encounter (Signed)
Received call from pt's wife stating pt having diarrhea & imodium not working.  Informed of imodium protocol for irinotecan b/c pt only taking in am & pm.  Informed Dr Burr Medico & she will also call in lomotil if imodium not helping.

## 2020-04-22 NOTE — Progress Notes (Unsigned)
lo

## 2020-04-25 NOTE — Progress Notes (Signed)
[place]   [phone] [phone]   Clinic Follow up Note   Patient Care Team: [name], MD as PCP - General (Family Medicine) [name], MD as Consulting Physician (Cardiology) [name], MD as Consulting Physician (Gastroenterology) [name], MD as Consulting Physician (General Surgery) [name], MD as Consulting Physician (General Surgery) [name], MD as Consulting Physician (Urology) [name], MD as Consulting Physician (Medical Oncology)  Date of Service:  04/28/2020  CHIEF COMPLAINT: F/u on colon cancer  SUMMARY OF ONCOLOGIC HISTORY: Oncology History Overview Note  Cancer Staging Cancer of left colon [facility]) Staging form: Colon and Rectum, AJCC 8th Edition - Pathologic stage from 01/11/2018: Stage IIIB (pT3, pN1c, cM0) - Signed by [name], MD on 01/16/2018     Cancer of left colon ([place])  01/10/2018 Imaging   CT AP W Contrast 01/10/18  IMPRESSION: Irregular soft tissue density causing stricture of the mid descending colon likely the site of obstruction for the dilated small bowel. 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 No evidence of perforation.  Equivocal findings involving the appendix measuring 1.2 cm at the appendiceal tip with mucosal enhancement. No adjacent free fluid or inflammatory change. Findings are nonspecific, but can be seen with early acute appendicitis.  Mild prostatic enlargement. Increased density over the posterior bladder base likely due to the large prostatic impression although cannot completely exclude a bladder mass. Urology protocol CT or ultrasound may be helpful for better evaluation.  Mild cholelithiasis.  Stable 1.5 cm cystic structure over the lower pole right kidney likely slightly hyperdense cyst.  Diverticulosis of the colon.  Aortic Atherosclerosis (ICD10-I70.0).     01/11/2018 Cancer Staging   Staging form: Colon and Rectum, AJCC  8th Edition - Pathologic stage from 01/11/2018: Stage IIIB (pT3, pN1c, cM0) - Signed by [name], MD on 01/16/2018   01/11/2018 Surgery   LEFT COLON RESECTION, TAKEDOWN SPLENIC FLEXURE, COLOSTOMY by Dr. [name]    01/11/2018 Procedure   Colonoscopy 01/11/18 by Dr. [name]  - Malignant completely obstructing tumor in the mid descending colon. Tattooed. - Diverticulosis in the sigmoid colon. - Non-bleeding internal hemorrhoids. - No specimens collected.   01/11/2018 Pathology Results   Diagnosis 01/11/18  1. Colon, segmental resection for tumor, descending colon - INVASIVE COLORECTAL ADENOCARCINOMA, 4 CM. - TUMOR EXTENDS INTO PERICOLONIC CONNECTIVE TISSUE. - TUMOR FOCALLY INVOLVES RADIAL MARGIN. - ONE MESENTERIC TUMOR DEPOSIT. - THIRTEEN BENIGN LYMPH NODES (0/13). 2. Colon, segmental resection, splenic flexure - BENIGN COLON. - NO EVIDENCE OF MALIGNANCY .   01/11/2018 Tumor Marker   Baseline CEA at 3.4   01/16/2018 Initial Diagnosis   Cancer of left colon ([place])   01/23/2018 Imaging   CT CHEST WO CONTRAST IMPRESSION: 1. No evidence for metastatic disease within the chest. 2. Small left pleural effusion with underlying opacities which may represent atelectasis. Right basilar atelectasis. 3. Few foci of gas within the upper abdomen in the omentum with surrounding fat stranding, likely postsurgical 4. Aortic Atherosclerosis (ICD10-I70.0).   03/08/2018 - 05/15/2018 Chemotherapy   adjuvant FOLOFX. Due to side effects of neuropahty Oxaliplatin was stopped after 3 cycles and chemo was stopped after 6 cycles. He declined completing 6 months of chemo treatment.     03/19/2018 Imaging   03/19/2018 CT AP IMPRESSION: 1. Interval partial left hemicolectomy and descending colostomy. 2. Heterogeneous soft tissue density along the left anterior renal fascia is most likely postoperative (favor fat necrosis). No well-defined fluid collection. 3. Mild left lower quadrant  edema, new since 01/10/2018. This  could be postoperative. Superimposed sigmoid diverticulitis and/or cystitis cannot be excluded. 4. Subtle hyperenhancing nodule within the anterior bladder dome cannot be excluded. Consider nonemergent cystoscopy. When this is performed, recommend attention to the left ureterovesicular junction and distal left ureter to evaluate questionable soft tissue fullness. 5. Cholelithiasis. 6.  Aortic Atherosclerosis (ICD10-I70.0). 7. Prostatomegaly.   11/13/2018 Imaging   CT CAP WO Contrast 11/13/18  IMPRESSION: 1. Reversal of left lower quadrant colostomy with sigmoid colon anastomosis. No complicating features. No findings for residual or recurrent tumor or metastatic disease involving the chest, abdomen or pelvis without contrast. 2. No acute abdominal/pelvic findings. 3. Gallbladder sludge and gallstones but no findings for acute cholecystitis. 4. Stable anterior abdominal wall hernia. 5. The right testicle is in the right inguinal canal.   10/03/2019 Imaging   CT CAP WO contrast  IMPRESSION: 1. New rounded density interposed between the prostate gland and anterior upper rectal wall could represent adenopathy or local extension of anterior rectal tumor. 2. Marked prostatomegaly, prostate volume 150 cubic cm. 3. Other imaging findings of potential clinical significance: Aortic Atherosclerosis (ICD10-I70.0). Coronary atherosclerosis. Trace right pleural effusion. Suspected cholelithiasis. Nonobstructive left nephrolithiasis. Multilevel lumbar impingement. Bilateral mildly retracted testicles. Hypodense exophytic lesion of the right kidney, most likely to be a cyst.   11/02/2019 Procedure   colonoscopy on 11/02/2019 by Dr. [name] Plan showed normal digital rectal exam, 3 polyps in the rectum, descending colon and cecum, and a prior sigmoid: Anastomosis characterized by erythema.  He found an extrinsic nonobstructing medium-sized mass in the proximal rectum about 4 cm in length, no internal  rectal mass.   Diagnosis Surgical [P], colon, cecum, descending, rectal, polyp (3) - TUBULAR ADENOMA (TWO) - NO HIGH GRADE DYSPLASIA OR CARCINOMA. - COLONIC FRAGMENT WITH BENIGN LYMPHOID AGGREGATE.   12/07/2019 Imaging   MRI pelvis IMPRESSION: 1. Masslike area in the rectum suspicious for rectal neoplasm, likely T4b based on the appearance of soft tissue extending along the anterior peritoneal reflection and into the seminal vesicles. Correlation with recent colonoscopy results may be helpful. Area of anastomosis and other areas of the pelvis are not imaged on today's exam.   12/21/2019 PET scan   IMPRESSION: 1. Unfortunately evidence for peritoneal metastasis. Intensely hypermetabolic nodules along the LEFT pericolic gutter. Favor hypermetabolic mass anterior to the rectum to represent serosal implant along the ventral surface of the rectum. 2. local recurrence within the LEFT abdominal wall at site prior colostomy. Intense hypermetabolic thickening of the rectus muscle at this site. 3. Two hypermetabolic hepatic metastasis.   01/15/2020 Relapse/Recurrence   FINAL MICROSCOPIC DIAGNOSIS:   A. SOFT TISSUE, LEFT ABDOMINAL WALL, BIOPSY:  - Adenocarcinoma.  - See comment.   COMMENT:   The morphology is consistent with metastatic colorectal adenocarcinoma.    01/28/2020 -  Chemotherapy   First line FOLFIRI q2weeks starting 01/28/20.Avastin added with C2.       CURRENT THERAPY:  First line FOLFIRI q2weeks starting 01/28/20. Bevacizumab added with C2.   INTERVAL HISTORY:  Dave Vaughn is here for a follow up. He presents to the clinic with his wife. He notes after C6 he felt sicker with nausea dn mostly diarrhea for 2-3 days after pump D/c. He was taking imodium up to 6 tabs and then started lomotil which helped better.  He notes his nausea has not effected his eating. He does note effects on his sleep at night.     REVIEW OF SYSTEMS:   Constitutional: Denies fevers,  chills or abnormal weight loss (+) trouble sleeping  Eyes: Denies blurriness of vision Ears, nose, mouth, throat, and face: Denies mucositis or sore throat Respiratory: Denies cough, dyspnea or wheezes Cardiovascular: Denies palpitation, chest discomfort or lower extremity swelling Gastrointestinal:  Denies heartburn (+) increased diarrhea and nausea  Skin: Denies abnormal skin rashes Lymphatics: Denies new lymphadenopathy or easy bruising Neurological:Denies numbness, tingling or new weaknesses Behavioral/Psych: Mood is stable, no new changes  All other systems were reviewed with the patient and are negative.  MEDICAL HISTORY:  Past Medical History:  Diagnosis Date  . Adenocarcinoma, colon ([place]) dx'd 01/2018  . Anemia    taking iron supplements  . Anxiety   . Atrial fibrillation with RVR ([place])   . Colonic obstruction ([place]) 01/10/2018  . Depression   . Diverticulitis   . Dysrhythmia    afib  . History of kidney stones     SURGICAL HISTORY: Past Surgical History:  Procedure Laterality Date  . ANKLE SURGERY Left    when he was in college  . COLON RESECTION N/A 01/11/2018   Procedure: LEFT COLON RESECTION, TAKEDOWN SPLENIC FLEXURE, COLOSTOMY;  Surgeon: [name], MD;  Location: [facility];  Service: General;  Laterality: N/A;  . COLONOSCOPY  01/11/2018   Procedure: COLONOSCOPY;  Surgeon: Jackquline Denmark, MD;  Location: [facility];  Service: Endoscopy;;  . COLONOSCOPY  05/10/2018  . colonscopy  05/10/2018  . HERNIA REPAIR    . HERNIA REPAIR  03/02/2019   EXPLORATORY LAPAROTOMY (N/A Abdomen)  . INCISIONAL HERNIA REPAIR N/A 03/02/2019   Procedure: INCISIONAL HERNIA REPAIR , RECTORECTUS VS TAR HERNIA REPAIR;  Surgeon: Ralene Ok, MD;  Location: [place];  Service: General;  Laterality: N/A;  . INSERTION OF MESH N/A 03/02/2019   Procedure: Insertion Of Mesh;  Surgeon: Ralene Ok, MD;  Location: [place];  Service: General;  Laterality: N/A;  . LAPAROTOMY N/A 03/02/2019   Procedure:  EXPLORATORY LAPAROTOMY;  Surgeon: Ralene Ok, MD;  Location: [place];  Service: General;  Laterality: N/A;  . LYSIS OF ADHESION N/A 06/12/2018   Procedure: LYSIS OF ADHESIONS;  Surgeon: [name], MD;  Location: [facility];  Service: General;  Laterality: N/A;  . LYSIS OF ADHESION N/A 03/02/2019   Procedure: Lysis Of Adhesion;  Surgeon: Ralene Ok, MD;  Location: [place];  Service: General;  Laterality: N/A;  . PORTACATH PLACEMENT Right 03/07/2018   Procedure: INSERTION PORT-A-CATH RIGHT SUBCLAVIAN;  Surgeon: [name], MD;  Location: [place];  Service: General;  Laterality: Right;  . PROCTOSCOPY N/A 06/12/2018   Procedure: RIGID PROCTOSCOPY;  Surgeon: [name], MD;  Location: [facility];  Service: General;  Laterality: N/A;  . thumb surgery   2018   cyst removal    I have reviewed the social history and family history with the patient and they are unchanged from previous note.  ALLERGIES:  has No Known Allergies.  MEDICATIONS:  Current Outpatient Medications  Medication Sig Dispense Refill  . apixaban (ELIQUIS) 5 MG TABS tablet Take 1 tablet (5 mg total) by mouth 2 (two) times daily. 60 tablet 0  . b complex vitamins tablet Take 1 tablet by mouth daily.    [name] diltiazem (CARDIZEM CD) 240 MG 24 hr capsule Take 240 mg by mouth daily.    . diphenoxylate-atropine (LOMOTIL) 2.5-0.025 MG tablet Take 1-2 tablets by mouth 4 (four) times daily as needed for diarrhea or loose stools. 30 tablet 0  . docusate sodium (COLACE) 100 MG capsule Take 100 mg by mouth  2 (two) times daily.    [name] levothyroxine (SYNTHROID) 75 MCG tablet Take 1 tablet (75 mcg total) by mouth daily. 90 tablet 1  . Multiple Vitamins-Iron (MULTIVITAMIN/IRON PO) Take 1 tablet by mouth daily.     . prochlorperazine (COMPAZINE) 10 MG tablet Take 1 tablet (10 mg total) by mouth every 6 (six) hours as needed for nausea or vomiting. 30 tablet 0  . tamsulosin (FLOMAX) 0.4 MG CAPS capsule Take 0.4 mg by mouth 2 (two) times daily.        No current facility-administered medications for this visit.    PHYSICAL EXAMINATION: ECOG PERFORMANCE STATUS: 1 - Symptomatic but completely ambulatory  Vitals:   04/28/20 0922  BP: (!) 124/95  Pulse: 83  Resp: 20  Temp: 98.3 F (36.8 C)  SpO2: 98%   Filed Weights   04/28/20 0922  Weight: 224 lb 12.8 oz (102 kg)    Due to COVID19 we will limit examination to appearance. Patient had no complaints.  GENERAL:alert, no distress and comfortable SKIN: skin color normal, no rashes or significant lesions EYES: normal, Conjunctiva are pink and non-injected, sclera clear  NEURO: alert & oriented x 3 with fluent speech   LABORATORY DATA:  I have reviewed the data as listed CBC Latest Ref Rng & Units 04/28/2020 04/14/2020 04/01/2020  WBC 4.0 - 10.5 K/uL 4.5 4.6 6.6  Hemoglobin 13.0 - 17.0 g/dL 13.0 12.6(L) 13.0  Hematocrit 39 - 52 % 38.9(L) 37.6(L) 39.2  Platelets 150 - 400 K/uL 207 261 227     CMP Latest Ref Rng & Units 04/28/2020 04/14/2020 04/01/2020  Glucose 70 - 99 mg/dL 132(H) 98 95  BUN 8 - 23 mg/dL _0 Creatinine 0.61 - 1.24 mg/dL 1.37(H) 1.29(H) 1.29(H)  Sodium 135 - 145 mmol/L 141 142 140  Potassium 3.5 - 5.1 mmol/L 3.8 4.2 4.1  Chloride 98 - 111 mmol/L 108 109 104  CO2 22 - 32 mmol/L _1 Calcium 8.9 - 10.3 mg/dL 9.0 8.9 9.7  Total Protein 6.5 - 8.1 g/dL 6.8 6.5 7.1  Total Bilirubin 0.3 - 1.2 mg/dL 0.5 0.5 0.6  Alkaline Phos 38 - 126 U/L 86 80 82  AST 15 - 41 U/L _2 ALT 0 - 44 U/L _3 RADIOGRAPHIC STUDIES: I have personally reviewed the radiological images as listed and agreed with the findings in the report. No results found.   ASSESSMENT & PLAN:  Dave Vaughn is a 74 y.o. male with   1. Cancer of left colon,adenocarcinoma, stage IIIB(pT3N1cM0), MSI-stable, liver and peritoneal recurrence 12/2019 -Diagnosed in 01/2018. Treated with surgery andadjuvantFOLFOX. Due to side effects Oxaliplatin was stopped after 3 cycles  and chemo was stopped after 6 cycles. He declined completing 6 months of chemo treatment. -Unfortunately he had local recurrence in left abdominal wall withperitoneal metastasis,Two hypermetabolic hepatic metastasisin 12/2019.HisUS Biopsyof abdominal wallfrom 01/2020 confirmed metastatic colorectal adenocarcinoma. -His 01/15/20 FO results showKras mutation, MSS, he is not a candidate for EGFR inhibitor or immunotherapy -I started him on first-line FOLFIRI q2weeks on 01/28/20.Bevacizumab (MVASI) was added with C2.If disease well controlled afterat least 6 monthsor poor tolerance, we can switch to maintenance chemo with 5FU or oral Xeloda.I discussed role of target therapy with RT or Surgery if indicated.Due to his both peritoneal and liver metastasis, unfortunately he is probably not a candidate for surgery such as HIPEC.  -S/p C6 he had increased diarrhea and more nausea. I reviewed  antidiarrheal and antiemetic use with him.  -Labs reviewed and adequate to proceed with C7 FOLFIRI and Bevacizumab today with 10% irinotecan dose reduction.   -Will proceed with CT CAP on 05/05/20.  -F/u in 2 weeks.    2. Diarrhea, nausea  -S/p C6 chemo his diarrhea and nausea increased. Imodium alone was not enough and he has started lomotil. He can alternate the two medications as needed during the day to manage his diarrhea.  -He has compazine which helps manage his nausea. His nausea has not effected his appetite or eating.  -I encouraged him to remain hydrated and watch for dizziness and dehydration.   3. Atrial Fibrillation -continue Eliquis. Continue follow-up with cardiologyDr Hawani  4.CKD stage III -Will avoid NSAIDs and CT IV contrast if GFR low -Will monitor on chemo.  5.BPH  -Followed by urologist Dr. Lovena Neighbours -Pelvic MRI on 7/1 shows marked prostatomegaly with signs of BPH extending into the bladder base  6.Goal of care discussion  -The patient understands the goal of care is  palliative. -He is full code now  7. Elevated TSH  -TSH on 02/15/20 at 91.79. He is on Synthroid since 02/16/20.Will continue to monitor.    Plan -Labs reviewed and adequate to proceed with C7FOLFIRI and Bevacizumabtoday with 10% irinotecan dose reduction.  -Lab, flush, f/u andchemoon 12/8, 12/27, 1/10 -CT CAP w contrast on 11/29 -restaging CT scan scheduled for next Monday     No problem-specific Assessment & Plan notes found for this encounter.   No orders of the defined types were placed in this encounter.  All questions were answered. The patient knows to call the clinic with any problems, questions or concerns. No barriers to learning was detected. The total time spent in the appointment was 30 minutes.     [name], MD 04/28/2020   I, Joslyn Devon, am acting as scribe for [name], MD.   I have reviewed the above documentation for accuracy and completeness, and I agree with the above.

## 2020-04-25 NOTE — Telephone Encounter (Signed)
scheduling

## 2020-04-28 ENCOUNTER — Encounter: Payer: Self-pay | Admitting: Hematology

## 2020-04-28 ENCOUNTER — Inpatient Hospital Stay (HOSPITAL_BASED_OUTPATIENT_CLINIC_OR_DEPARTMENT_OTHER): Payer: Commercial Managed Care - PPO | Admitting: Hematology

## 2020-04-28 ENCOUNTER — Inpatient Hospital Stay: Payer: Commercial Managed Care - PPO

## 2020-04-28 ENCOUNTER — Other Ambulatory Visit: Payer: Self-pay

## 2020-04-28 VITALS — BP 124/95 | HR 83 | Temp 98.3°F | Resp 20 | Ht 70.0 in | Wt 224.8 lb

## 2020-04-28 DIAGNOSIS — I482 Chronic atrial fibrillation, unspecified: Secondary | ICD-10-CM

## 2020-04-28 DIAGNOSIS — Z7189 Other specified counseling: Secondary | ICD-10-CM

## 2020-04-28 DIAGNOSIS — C186 Malignant neoplasm of descending colon: Secondary | ICD-10-CM

## 2020-04-28 DIAGNOSIS — Z95828 Presence of other vascular implants and grafts: Secondary | ICD-10-CM

## 2020-04-28 DIAGNOSIS — Z5112 Encounter for antineoplastic immunotherapy: Secondary | ICD-10-CM | POA: Diagnosis not present

## 2020-04-28 LAB — CBC WITH DIFFERENTIAL (CANCER CENTER ONLY)
Abs Immature Granulocytes: 0.01 10*3/uL (ref 0.00–0.07)
Basophils Absolute: 0.1 10*3/uL (ref 0.0–0.1)
Basophils Relative: 2 %
Eosinophils Absolute: 0.2 10*3/uL (ref 0.0–0.5)
Eosinophils Relative: 3 %
HCT: 38.9 % — ABNORMAL LOW (ref 39.0–52.0)
Hemoglobin: 13 g/dL (ref 13.0–17.0)
Immature Granulocytes: 0 %
Lymphocytes Relative: 34 %
Lymphs Abs: 1.5 10*3/uL (ref 0.7–4.0)
MCH: 31 pg (ref 26.0–34.0)
MCHC: 33.4 g/dL (ref 30.0–36.0)
MCV: 92.6 fL (ref 80.0–100.0)
Monocytes Absolute: 0.5 10*3/uL (ref 0.1–1.0)
Monocytes Relative: 10 %
Neutro Abs: 2.3 10*3/uL (ref 1.7–7.7)
Neutrophils Relative %: 51 %
Platelet Count: 207 10*3/uL (ref 150–400)
RBC: 4.2 MIL/uL — ABNORMAL LOW (ref 4.22–5.81)
RDW: 15.1 % (ref 11.5–15.5)
WBC Count: 4.5 10*3/uL (ref 4.0–10.5)
nRBC: 0 % (ref 0.0–0.2)

## 2020-04-28 LAB — CMP (CANCER CENTER ONLY)
ALT: 18 U/L (ref 0–44)
AST: 16 U/L (ref 15–41)
Albumin: 3.5 g/dL (ref 3.5–5.0)
Alkaline Phosphatase: 86 U/L (ref 38–126)
Anion gap: 8 (ref 5–15)
BUN: 20 mg/dL (ref 8–23)
CO2: 25 mmol/L (ref 22–32)
Calcium: 9 mg/dL (ref 8.9–10.3)
Chloride: 108 mmol/L (ref 98–111)
Creatinine: 1.37 mg/dL — ABNORMAL HIGH (ref 0.61–1.24)
GFR, Estimated: 54 mL/min — ABNORMAL LOW (ref >60)
Glucose, Bld: 132 mg/dL — ABNORMAL HIGH (ref 70–99)
Potassium: 3.8 mmol/L (ref 3.5–5.1)
Sodium: 141 mmol/L (ref 135–145)
Total Bilirubin: 0.5 mg/dL (ref 0.3–1.2)
Total Protein: 6.8 g/dL (ref 6.5–8.1)

## 2020-04-28 LAB — CEA (IN HOUSE-CHCC): CEA (CHCC-In House): 4.54 ng/mL (ref 0.00–5.00)

## 2020-04-28 MED ORDER — SODIUM CHLORIDE 0.9 % IV SOLN
2400.0000 mg/m2 | INTRAVENOUS | Status: DC
  Administered 2020-04-28: 5400 mg via INTRAVENOUS
  Filled 2020-04-28: qty 108

## 2020-04-28 MED ORDER — SODIUM CHLORIDE 0.9 % IV SOLN
400.0000 mg/m2 | Freq: Once | INTRAVENOUS | Status: AC
  Administered 2020-04-28: 900 mg via INTRAVENOUS
  Filled 2020-04-28: qty 45

## 2020-04-28 MED ORDER — SODIUM CHLORIDE 0.9 % IV SOLN
Freq: Once | INTRAVENOUS | Status: AC
  Filled 2020-04-28: qty 250

## 2020-04-28 MED ORDER — ATROPINE SULFATE 1 MG/ML IJ SOLN
0.5000 mg | Freq: Once | INTRAMUSCULAR | Status: AC | PRN
  Administered 2020-04-28: 0.5 mg via INTRAVENOUS

## 2020-04-28 MED ORDER — SODIUM CHLORIDE 0.9 % IV SOLN
5.0000 mg/kg | Freq: Once | INTRAVENOUS | Status: AC
  Administered 2020-04-28: 500 mg via INTRAVENOUS
  Filled 2020-04-28: qty 16

## 2020-04-28 MED ORDER — ATROPINE SULFATE 1 MG/ML IJ SOLN
INTRAMUSCULAR | Status: AC
  Filled 2020-04-28: qty 1

## 2020-04-28 MED ORDER — SODIUM CHLORIDE 0.9 % IV SOLN
165.0000 mg/m2 | Freq: Once | INTRAVENOUS | Status: AC
  Administered 2020-04-28: 380 mg via INTRAVENOUS
  Filled 2020-04-28: qty 15

## 2020-04-28 MED ORDER — SODIUM CHLORIDE 0.9% FLUSH
10.0000 mL | INTRAVENOUS | Status: DC | PRN
  Administered 2020-04-28: 10 mL
  Filled 2020-04-28: qty 10

## 2020-04-28 MED ORDER — PALONOSETRON HCL INJECTION 0.25 MG/5ML
0.2500 mg | Freq: Once | INTRAVENOUS | Status: AC
  Administered 2020-04-28: 0.25 mg via INTRAVENOUS

## 2020-04-28 MED ORDER — SODIUM CHLORIDE 0.9 % IV SOLN
10.0000 mg | Freq: Once | INTRAVENOUS | Status: AC
  Administered 2020-04-28: 10 mg via INTRAVENOUS
  Filled 2020-04-28: qty 10

## 2020-04-28 MED ORDER — PALONOSETRON HCL INJECTION 0.25 MG/5ML
INTRAVENOUS | Status: AC
  Filled 2020-04-28: qty 5

## 2020-04-28 NOTE — Patient Instructions (Signed)

## 2020-04-28 NOTE — Patient Instructions (Signed)
[place] Discharge Instructions for Patients Receiving Chemotherapy  Today you received the following chemotherapy agents: Avastin, Irinotecan, Leucovorin, and Fluorouracil  To help prevent nausea and vomiting after your treatment, we encourage you to take your nausea medication  as prescribed.    If you develop nausea and vomiting that is not controlled by your nausea medication, call the clinic.   BELOW ARE SYMPTOMS THAT SHOULD BE REPORTED IMMEDIATELY:  *FEVER GREATER THAN 100.5 F  *CHILLS WITH OR WITHOUT FEVER  NAUSEA AND VOMITING THAT IS NOT CONTROLLED WITH YOUR NAUSEA MEDICATION  *UNUSUAL SHORTNESS OF BREATH  *UNUSUAL BRUISING OR BLEEDING  TENDERNESS IN MOUTH AND THROAT WITH OR WITHOUT PRESENCE OF ULCERS  *URINARY PROBLEMS  *BOWEL PROBLEMS  UNUSUAL RASH Items with * indicate a potential emergency and should be followed up as soon as possible.  Feel free to call the clinic should you have any questions or concerns. The clinic phone number is [phone].  Please show the [facility] at check-in to the Emergency Department and triage nurse.

## 2020-04-30 ENCOUNTER — Telehealth: Payer: Self-pay | Admitting: Hematology

## 2020-04-30 ENCOUNTER — Inpatient Hospital Stay: Payer: Commercial Managed Care - PPO

## 2020-04-30 ENCOUNTER — Other Ambulatory Visit: Payer: Self-pay

## 2020-04-30 VITALS — BP 129/73 | HR 99 | Temp 98.5°F | Resp 18

## 2020-04-30 DIAGNOSIS — C186 Malignant neoplasm of descending colon: Secondary | ICD-10-CM

## 2020-04-30 DIAGNOSIS — Z7189 Other specified counseling: Secondary | ICD-10-CM

## 2020-04-30 DIAGNOSIS — Z5112 Encounter for antineoplastic immunotherapy: Secondary | ICD-10-CM | POA: Diagnosis not present

## 2020-04-30 MED ORDER — SODIUM CHLORIDE 0.9% FLUSH
10.0000 mL | INTRAVENOUS | Status: DC | PRN
  Administered 2020-04-30: 10 mL
  Filled 2020-04-30: qty 10

## 2020-04-30 MED ORDER — HEPARIN SOD (PORK) LOCK FLUSH 100 UNIT/ML IV SOLN
500.0000 [IU] | Freq: Once | INTRAVENOUS | Status: AC | PRN
  Administered 2020-04-30: 500 [IU]
  Filled 2020-04-30: qty 5

## 2020-04-30 NOTE — Telephone Encounter (Signed)
No 11/22 los. No changes made to pt's schedule.

## 2020-05-03 ENCOUNTER — Other Ambulatory Visit: Payer: Self-pay | Admitting: Hematology

## 2020-05-05 ENCOUNTER — Other Ambulatory Visit: Payer: Self-pay

## 2020-05-05 ENCOUNTER — Encounter (HOSPITAL_COMMUNITY): Payer: Self-pay

## 2020-05-05 ENCOUNTER — Ambulatory Visit (HOSPITAL_COMMUNITY)
Admission: RE | Admit: 2020-05-05 | Discharge: 2020-05-05 | Disposition: A | Discharge status: Home / Self Care | Payer: Commercial Managed Care - PPO | Source: Ambulatory Visit | Attending: Nurse Practitioner | Admitting: Nurse Practitioner

## 2020-05-05 DIAGNOSIS — C186 Malignant neoplasm of descending colon: Secondary | ICD-10-CM | POA: Diagnosis not present

## 2020-05-05 HISTORY — DX: Essential (primary) hypertension: I10

## 2020-05-05 MED ORDER — IOHEXOL 300 MG/ML  SOLN
100.0000 mL | Freq: Once | INTRAMUSCULAR | Status: AC | PRN
  Administered 2020-05-05: 100 mL via INTRAVENOUS

## 2020-05-06 ENCOUNTER — Ambulatory Visit (INDEPENDENT_AMBULATORY_CARE_PROVIDER_SITE_OTHER): Payer: Commercial Managed Care - PPO | Admitting: Cardiology

## 2020-05-06 ENCOUNTER — Encounter: Payer: Self-pay | Admitting: Cardiology

## 2020-05-06 VITALS — BP 100/66 | HR 137 | Ht 71.0 in | Wt 228.8 lb

## 2020-05-06 DIAGNOSIS — I482 Chronic atrial fibrillation, unspecified: Secondary | ICD-10-CM

## 2020-05-06 DIAGNOSIS — N1831 Chronic kidney disease, stage 3a: Secondary | ICD-10-CM

## 2020-05-06 MED ORDER — DILTIAZEM HCL ER COATED BEADS 360 MG PO CP24: 360.0000 mg | ORAL_CAPSULE | Freq: Every day | ORAL | 3 refills | Status: DC

## 2020-05-06 NOTE — Patient Instructions (Addendum)
Medication Instructions:  Please increase your Diltiazem to 360 mg a day.  Continue all other medications as listed.  *If you need a refill on your cardiac medications before your next appointment, please call your pharmacy*  Follow-Up: At [facility], you and your health needs are our priority.  As part of our continuing mission to provide you with exceptional heart care, we have created designated Provider Care Teams.  These Care Teams include your primary Cardiologist (physician) and Advanced Practice Providers (APPs -  Physician Assistants and Nurse Practitioners) who all work together to provide you with the care you need, when you need it.  We recommend signing up for the patient portal called "MyChart".  Sign up information is provided on this After Visit Summary.  MyChart is used to connect with patients for Virtual Visits (Telemedicine).  Patients are able to view lab/test results, encounter notes, upcoming appointments, etc.  Non-urgent messages can be sent to your provider as well.   To learn more about what you can do with MyChart, go to [email or web address].    Your next appointment:   3 day(s)  The format for your next appointment:   In Person  Provider:   Candee Furbish, MD   Thank you for choosing [facility]!!

## 2020-05-06 NOTE — Progress Notes (Signed)
Cardiology Office Note:    Date:  05/06/2020   ID:  Dave Vaughn, DOB Mar 14, 1946, MRN [id number]  PCP:  [name], MD  [place] Cardiologist:  No primary care provider on file.  [facility] Electrophysiologist:  None   Referring MD: [name], MD     History of Present Illness:    Dave Vaughn is a 74 y.o. male here for the evaluation of permanent atrial fibrillation at the request of Dr. Ethlyn Gallery.   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 Burr Medico, last office note from 04/28/2020 reviewed.  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  He also had 2 hypermetabolic hepatic metastasis in July 2021.  Goals of care discussion reviewed, palliative.  His atrial fibrillation has been treated with Eliquis for chronic anticoagulation and stroke prevention.  He previously was seen by Dr. [name].  He comes in and his heart rate is 137 bpm.  However his Fitbit watch is telling him 64 to 69 bpm.  Explained to him that the watch is not likely picking up his beats because of his irregularity and atrial fibrillation.  Has been on diltiazem 240 mg a day.  Denies any chest pain fevers chills nausea vomiting syncope bleeding.  He also has chronic kidney disease stage III as well as BPH.   Past Medical History:  Diagnosis Date  . Adenocarcinoma, colon ([place]) dx'd 01/2018  . Anemia    taking iron supplements  . Anxiety   . Atrial fibrillation with RVR ([place])   . Colonic obstruction ([place]) 01/10/2018  . Depression   . Diverticulitis   . Dysrhythmia    afib  . History of kidney stones   . Hypertension     Past Surgical History:  Procedure Laterality Date  . ANKLE SURGERY Left    when he was in college  . COLON RESECTION N/A 01/11/2018   Procedure: LEFT COLON RESECTION, TAKEDOWN SPLENIC FLEXURE, COLOSTOMY;  Surgeon: [name], MD;  [facility];  Service: General;  Laterality: N/A;    . COLONOSCOPY  01/11/2018   Procedure: COLONOSCOPY;  Surgeon: Jackquline Denmark, MD;  [facility];  Service: Endoscopy;;  . COLONOSCOPY  05/10/2018  . colonscopy  05/10/2018  . HERNIA REPAIR    . HERNIA REPAIR  03/02/2019   EXPLORATORY LAPAROTOMY (N/A Abdomen)  . INCISIONAL HERNIA REPAIR N/A 03/02/2019   Procedure: INCISIONAL HERNIA REPAIR , RECTORECTUS VS TAR HERNIA REPAIR;  Surgeon: Ralene Ok, MD;  Location: [place];  Service: General;  Laterality: N/A;  . INSERTION OF MESH N/A 03/02/2019   Procedure: Insertion Of Mesh;  Surgeon: Ralene Ok, MD;  Location: [place];  Service: General;  Laterality: N/A;  . LAPAROTOMY N/A 03/02/2019   Procedure: EXPLORATORY LAPAROTOMY;  Surgeon: Ralene Ok, MD;  Location: [place];  Service: General;  Laterality: N/A;  . LYSIS OF ADHESION N/A 06/12/2018   Procedure: LYSIS OF ADHESIONS;  Surgeon: [name], MD;  [facility];  Service: General;  Laterality: N/A;  . LYSIS OF ADHESION N/A 03/02/2019   Procedure: Lysis Of Adhesion;  Surgeon: Ralene Ok, MD;  Location: [place];  Service: General;  Laterality: N/A;  . PORTACATH PLACEMENT Right 03/07/2018   Procedure: INSERTION PORT-A-CATH RIGHT SUBCLAVIAN;  Surgeon: [name], MD;  Location: [place];  Service: General;  Laterality: Right;  . PROCTOSCOPY N/A 06/12/2018   Procedure: RIGID PROCTOSCOPY;  Surgeon: [name], MD;  Location: [facility];  Service: General;  Laterality: N/A;  . thumb surgery   2018   cyst removal    Current Medications: Current Meds  Medication Sig  . apixaban (ELIQUIS) 5 MG TABS tablet Take 1 tablet (5 mg total) by mouth 2 (two) times daily.  [name] b complex vitamins tablet Take 1 tablet by mouth daily.  [name] docusate sodium (COLACE) 100 MG capsule Take 100 mg by mouth 2 (two) times daily.  [name] levothyroxine (SYNTHROID) 75 MCG tablet Take 1 tablet (75 mcg total) by mouth daily.  . Multiple Vitamins-Iron (MULTIVITAMIN/IRON PO) Take 1 tablet by mouth daily.   .  prochlorperazine (COMPAZINE) 10 MG tablet Take 1 tablet (10 mg total) by mouth every 6 (six) hours as needed for nausea or vomiting.  . tamsulosin (FLOMAX) 0.4 MG CAPS capsule Take 0.4 mg by mouth 2 (two) times daily.   . [DISCONTINUED] diltiazem (CARDIZEM CD) 240 MG 24 hr capsule Take 240 mg by mouth daily.     Allergies:   Patient has no known allergies.   Social History   Socioeconomic History  . Marital status: Married    Spouse name: Not on file  . Number of children: Not on file  . Years of education: Not on file  . Highest education level: Not on file  Occupational History  . Not on file  Tobacco Use  . Smoking status: Never Smoker  . Smokeless tobacco: Former Systems developer    Types: Secondary school teacher  . Vaping Use: Never used  Substance and Sexual Activity  . Alcohol use: Yes    Alcohol/week: 2.0 standard drinks    Types: 1 Cans of beer, 1 Shots of liquor per week  . Drug use: Never  . Sexual activity: Not on file  Other Topics Concern  . Not on file  Social History Narrative  . Not on file   Social Determinants of Health   Financial Resource Strain:   . Difficulty of Paying Living Expenses: Not on file  Food Insecurity:   . Worried About Charity fundraiser in the Last Year: Not on file  . Ran Out of Food in the Last Year: Not on file  Transportation Needs:   . Lack of Transportation (Medical): Not on file  . Lack of Transportation (Non-Medical): Not on file  Physical Activity:   . Days of Exercise per Week: Not on file  . Minutes of Exercise per Session: Not on file  Stress:   . Feeling of Stress : Not on file  Social Connections:   . Frequency of Communication with Friends and Family: Not on file  . Frequency of Social Gatherings with Friends and Family: Not on file  . Attends Religious Services: Not on file  . Active Member of Clubs or Organizations: Not on file  . Attends Archivist Meetings: Not on file  . Marital Status: Not on file     Family  History: The patient's family history includes Breast cancer (age of onset: 85) in his mother; Cancer in his daughter; Heart attack (age of onset: 71) in his father. There is no history of Esophageal cancer, Colon cancer, Rectal cancer, or Ulcerative colitis.  ROS:   Please see the history of present illness.     All other systems reviewed and are negative.  EKGs/Labs/Other Studies Reviewed:    The following studies were reviewed today:   EKG:  EKG is  ordered today.  The ekg ordered today demonstrates atrial fibrillation 137  rapid ventricular response  Recent Labs: 02/15/2020: TSH 91.79 04/28/2020: ALT 18; BUN 20; Creatinine 1.37; Hemoglobin 13.0; Platelet Count 207; Potassium 3.8; Sodium 141  Recent Lipid Panel No results found for: CHOL, TRIG, HDL, CHOLHDL, VLDL, LDLCALC, LDLDIRECT   Physical Exam:    VS:  BP 100/66   Pulse (!) 137   Ht 5\' 11"  (1.803 m)   Wt 228 lb 12.8 oz (103.8 kg)   SpO2 96%   BMI 31.91 kg/m     Wt Readings from Last 3 Encounters:  05/06/20 228 lb 12.8 oz (103.8 kg)  04/28/20 224 lb 12.8 oz (102 kg)  04/14/20 228 lb 6.4 oz (103.6 kg)     GEN:  Well nourished, well developed in no acute distress HEENT: Normal NECK: No JVD; No carotid bruits LYMPHATICS: No lymphadenopathy CARDIAC: Irregularly irregular, tachycardic, no murmurs, rubs, gallops RESPIRATORY:  Clear to auscultation without rales, wheezing or rhonchi  ABDOMEN: Soft, non-tender, non-distended MUSCULOSKELETAL:  No edema; No deformity  SKIN: Warm and dry NEUROLOGIC:  Alert and oriented x 3 PSYCHIATRIC:  Normal affect   ASSESSMENT:    1. Chronic atrial fibrillation   2. Stage 3a chronic kidney disease (HCC)    PLAN:    In order of problems listed above:  Permanent atrial fibrillation -Heart rate seems elevated on the Cardizem CD 240 mg once a day. -We will increase his Cardizem CD to 360 mg once a day. -Agree with current treatment strategy including Eliquis for anticoagulation  and stroke prevention.  Tolerating well without any bleeding issues.   Chronic anticoagulation -Eliquis.  Continue close monitoring of hemoglobin and creatinine.  Hemoglobin 13.0  Chronic kidney disease stage IIIa -Avoiding NSAIDs. -Most recent creatinine 1.37.  Colon cancer therapy per Dr. Burr Medico. Notes reviewed  We will go ahead and send this out to his pharmacy.  I will see him back Thursday afternoon for EKG and to check his heart rate again.   Medication Adjustments/Labs and Tests Ordered: Current medicines are reviewed at length with the patient today.  Concerns regarding medicines are outlined above.  Orders Placed This Encounter  Procedures  . EKG 12-Lead   Meds ordered this encounter  Medications  . diltiazem (CARDIZEM CD) 360 MG 24 hr capsule    Sig: Take 1 capsule (360 mg total) by mouth daily.    Dispense:  90 capsule    Refill:  3    Dose increase - please d/c any other RX for Diltiazem    Patient Instructions  Medication Instructions:  Please increase your Diltiazem to 360 mg a day.  Continue all other medications as listed.  *If you need a refill on your cardiac medications before your next appointment, please call your pharmacy*  Follow-Up: At [facility], you and your health needs are our priority.  As part of our continuing mission to provide you with exceptional heart care, we have created designated Provider Care Teams.  These Care Teams include your primary Cardiologist (physician) and Advanced Practice Providers (APPs -  Physician Assistants and Nurse Practitioners) who all work together to provide you with the care you need, when you need it.  We recommend signing up for the patient portal called "MyChart".  Sign up information is provided on this After Visit Summary.  MyChart is used to connect with patients for Virtual Visits (Telemedicine).  Patients are able to view lab/test results, encounter notes, upcoming appointments, etc.  Non-urgent messages can  be sent to your provider as well.   To  learn more about what you can do with MyChart, go to [email or web address].    Your next appointment:   3 day(s)  The format for your next appointment:   In Person  Provider:   Candee Furbish, MD   Thank you for choosing [facility]!!        Signed, Candee Furbish, MD  05/06/2020 11:52 AM    [place]

## 2020-05-07 ENCOUNTER — Other Ambulatory Visit: Payer: Self-pay | Admitting: Hematology

## 2020-05-07 MED ORDER — DIPHENOXYLATE-ATROPINE 2.5-0.025 MG PO TABS
1.0000 | ORAL_TABLET | Freq: Four times a day (QID) | ORAL | 1 refills | Status: DC | PRN
Start: 2020-05-07 — End: 2021-03-04

## 2020-05-09 ENCOUNTER — Encounter: Payer: Self-pay | Admitting: Cardiology

## 2020-05-09 ENCOUNTER — Ambulatory Visit (INDEPENDENT_AMBULATORY_CARE_PROVIDER_SITE_OTHER): Payer: Commercial Managed Care - PPO | Admitting: Cardiology

## 2020-05-09 ENCOUNTER — Other Ambulatory Visit: Payer: Self-pay

## 2020-05-09 VITALS — BP 120/70 | HR 81 | Ht 71.0 in | Wt 233.0 lb

## 2020-05-09 DIAGNOSIS — N1831 Chronic kidney disease, stage 3a: Secondary | ICD-10-CM

## 2020-05-09 DIAGNOSIS — I4821 Permanent atrial fibrillation: Secondary | ICD-10-CM

## 2020-05-09 NOTE — Progress Notes (Signed)
Cardiology Office Note:    Date:  05/09/2020   ID:  Dave Vaughn, DOB September 11, 1945, MRN [id number]  PCP:  [name], MD  [place] Cardiologist:  No primary care provider on file.  [facility] Electrophysiologist:  None   Referring MD: [name], MD     History of Present Illness:    Dave Vaughn is a 74 y.o. male here for follow-up atrial fibrillation heart rate, diltiazem. We increased his Cardizem CD from 240-360 once a day.  He is on Eliquis.  Trying to optimize his rate control. No symptoms the entire time.   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 Burr Medico, last office note from 04/28/2020 reviewed.  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  He also had 2 hypermetabolic hepatic metastasis in July 2021.  Goals of care discussion reviewed, palliative.  His atrial fibrillation has been treated with Eliquis for chronic anticoagulation and stroke prevention.  He previously was seen by Dr. [name].  He comes in and his heart rate is 137 bpm.  However his Fitbit watch is telling him 64 to 69 bpm.  Explained to him that the watch is not likely picking up his beats because of his irregularity and atrial fibrillation.  Has been on diltiazem 240 mg a day.  Denies any chest pain fevers chills nausea vomiting syncope bleeding.  He also has chronic kidney disease stage III as well as BPH.  Past Medical History:  Diagnosis Date   Adenocarcinoma, colon ([place]) dx'd 01/2018   Anemia    taking iron supplements   Anxiety    Atrial fibrillation with RVR (HCC)    Colonic obstruction ([place]) 01/10/2018   Depression    Diverticulitis    Dysrhythmia    afib   History of kidney stones    Hypertension     Past Surgical History:  Procedure Laterality Date   ANKLE SURGERY Left    when he was in college   COLON RESECTION N/A 01/11/2018   Procedure: LEFT COLON RESECTION,  TAKEDOWN SPLENIC FLEXURE, COLOSTOMY;  Surgeon: [name], MD;  [facility];  Service: General;  Laterality: N/A;   COLONOSCOPY  01/11/2018   Procedure: COLONOSCOPY;  Surgeon: Jackquline Denmark, MD;  [facility];  Service: Endoscopy;;   COLONOSCOPY  05/10/2018   colonscopy  05/10/2018   HERNIA REPAIR     HERNIA REPAIR  03/02/2019   EXPLORATORY LAPAROTOMY (N/A Abdomen)   INCISIONAL HERNIA REPAIR N/A 03/02/2019   Procedure: INCISIONAL HERNIA REPAIR , RECTORECTUS VS TAR HERNIA REPAIR;  Surgeon: Ralene Ok, MD;  Location: [place];  Service: General;  Laterality: N/A;   INSERTION OF MESH N/A 03/02/2019   Procedure: Insertion Of Mesh;  Surgeon: Ralene Ok, MD;  Location: [place];  Service: General;  Laterality: N/A;   LAPAROTOMY N/A 03/02/2019   Procedure: EXPLORATORY LAPAROTOMY;  Surgeon: Ralene Ok, MD;  Location: [place];  Service: General;  Laterality: N/A;   LYSIS OF ADHESION N/A 06/12/2018   Procedure: LYSIS OF ADHESIONS;  Surgeon: [name], MD;  [facility];  Service: General;  Laterality: N/A;   LYSIS OF ADHESION N/A 03/02/2019   Procedure: Lysis Of Adhesion;  Surgeon: Ralene Ok, MD;  Location: [place];  Service: General;  Laterality: N/A;   PORTACATH PLACEMENT Right 03/07/2018   Procedure: INSERTION PORT-A-CATH RIGHT SUBCLAVIAN;  Surgeon: [name], MD;  Location: [place];  Service: General;  Laterality: Right;   PROCTOSCOPY N/A 06/12/2018   Procedure: RIGID PROCTOSCOPY;  Surgeon: [name], MD;  [facility];  Service: General;  Laterality: N/A;   thumb surgery   2018   cyst removal    Current Medications: Current Meds  Medication Sig   apixaban (ELIQUIS) 5 MG TABS tablet Take 1 tablet (5 mg total) by mouth 2 (two) times daily.   b complex vitamins tablet Take 1 tablet by mouth daily.   diltiazem (CARDIZEM CD) 360 MG 24 hr capsule Take 1 capsule (360 mg total) by mouth daily.   diphenoxylate-atropine (LOMOTIL) 2.5-0.025 MG  tablet Take 1-2 tablets by mouth 4 (four) times daily as needed for diarrhea or loose stools.   docusate sodium (COLACE) 100 MG capsule Take 100 mg by mouth 2 (two) times daily.   levothyroxine (SYNTHROID) 75 MCG tablet Take 1 tablet (75 mcg total) by mouth daily.   Multiple Vitamins-Iron (MULTIVITAMIN/IRON PO) Take 1 tablet by mouth daily.    prochlorperazine (COMPAZINE) 10 MG tablet Take 1 tablet (10 mg total) by mouth every 6 (six) hours as needed for nausea or vomiting.   tamsulosin (FLOMAX) 0.4 MG CAPS capsule Take 0.4 mg by mouth 2 (two) times daily.      Allergies:   Patient has no known allergies.   Social History   Socioeconomic History   Marital status: Married    Spouse name: Not on file   Number of children: Not on file   Years of education: Not on file   Highest education level: Not on file  Occupational History   Not on file  Tobacco Use   Smoking status: Never Smoker   Smokeless tobacco: Former User    Types: Nurse, children's Use: Never used  Substance and Sexual Activity   Alcohol use: Yes    Alcohol/week: 2.0 standard drinks    Types: 1 Cans of beer, 1 Shots of liquor per week   Drug use: Never   Sexual activity: Not on file  Other Topics Concern   Not on file  Social History Narrative   Not on file   Social Determinants of Health   Financial Resource Strain:    Difficulty of Paying Living Expenses: Not on file  Food Insecurity:    Worried About [place] in the Last Year: Not on file   Ran Out of Food in the Last Year: Not on file  Transportation Needs:    Lack of Transportation (Medical): Not on file   Lack of Transportation (Non-Medical): Not on file  Physical Activity:    Days of Exercise per Week: Not on file   Minutes of Exercise per Session: Not on file  Stress:    Feeling of Stress : Not on file  Social Connections:    Frequency of Communication with Friends and Family: Not on file    Frequency of Social Gatherings with Friends and Family: Not on file   Attends Religious Services: Not on file   Active Member of Clubs or Organizations: Not on file   Attends Archivist Meetings: Not on file   Marital Status: Not on file     Family History: The patient's family history includes Breast cancer (age of onset: 70) in his mother; Cancer in his daughter; Heart attack (age of onset: 6) in his father. There is no history of Esophageal cancer, Colon cancer, Rectal cancer, or Ulcerative colitis.  ROS:   Please see the history  of present illness.     All other systems reviewed and are negative.   EKG:  EKG is  ordered today.  The ekg ordered today demonstrates atrial fibrillation 81 bpm.  Much improved.  Recent Labs: 02/15/2020: TSH 91.79 04/28/2020: ALT 18; BUN 20; Creatinine 1.37; Hemoglobin 13.0; Platelet Count 207; Potassium 3.8; Sodium 141  Recent Lipid Panel No results found for: CHOL, TRIG, HDL, CHOLHDL, VLDL, LDLCALC, LDLDIRECT   Physical Exam:    VS:  BP 120/70 (BP Location: Left Arm, Patient Position: Sitting, Cuff Size: Normal)    Pulse 81    Ht 5\' 11"  (1.803 m)    Wt 233 lb (105.7 kg)    SpO2 97%    BMI 32.50 kg/m     Wt Readings from Last 3 Encounters:  05/09/20 233 lb (105.7 kg)  05/06/20 228 lb 12.8 oz (103.8 kg)  04/28/20 224 lb 12.8 oz (102 kg)     GEN:  Well nourished, well developed in no acute distress HEENT: Normal NECK: No JVD; No carotid bruits LYMPHATICS: No lymphadenopathy CARDIAC: irreg, no murmurs, rubs, gallops RESPIRATORY:  Clear to auscultation without rales, wheezing or rhonchi  ABDOMEN: Soft, non-tender, non-distended MUSCULOSKELETAL:  No edema; No deformity  SKIN: Warm and dry NEUROLOGIC:  Alert and oriented x 3 PSYCHIATRIC:  Normal affect   ASSESSMENT:    1. Permanent atrial fibrillation (HCC)   2. Stage 3a chronic kidney disease (HCC)    PLAN:    In order of problems listed above:  Permanent atrial  fibrillation -Cardizem increased from 2 40-3 60 at last visit.  His heart rate today is 81.  Previously it was 137.  Much improved.  Once again, he was asymptomatic the entire time from a heart perspective.  No chest pain no shortness of breath no syncope.  Continue with current strategy at diltiazem 360 once a day.  Since he is asymptomatic, we will continue with good overall adequate rate control.  Colon cancer -Seeing oncology therapy per Dr. Burr Medico.  Notes reviewed.  Palliative efforts.  Chronic anticoagulation -Eliquis.  Continue to closely monitor hemoglobin and creatinine.  Chronic any disease stage IIIa -Creatinine 1.37.  Avoid NSAIDs.  6 mth follow up.  Shared Decision Making/Informed Consent        Medication Adjustments/Labs and Tests Ordered: Current medicines are reviewed at length with the patient today.  Concerns regarding medicines are outlined above.  Orders Placed This Encounter  Procedures   EKG 12-Lead   No orders of the defined types were placed in this encounter.   Patient Instructions  Medication Instructions:  The current medical regimen is effective;  continue present plan and medications.  *If you need a refill on your cardiac medications before your next appointment, please call your pharmacy*  Follow-Up: At [facility], you and your health needs are our priority.  As part of our continuing mission to provide you with exceptional heart care, we have created designated Provider Care Teams.  These Care Teams include your primary Cardiologist (physician) and Advanced Practice Providers (APPs -  Physician Assistants and Nurse Practitioners) who all work together to provide you with the care you need, when you need it.  We recommend signing up for the patient portal called "MyChart".  Sign up information is provided on this After Visit Summary.  MyChart is used to connect with patients for Virtual Visits (Telemedicine).  Patients are able to view lab/test  results, encounter notes, upcoming appointments, etc.  Non-urgent messages can be sent  to your provider as well.   To learn more about what you can do with MyChart, go to [email or web address].    Your next appointment:   6 month(s)  The format for your next appointment:   In Person  Provider:   Candee Furbish, MD   Thank you for choosing [facility]!!        Signed, Candee Furbish, MD  05/09/2020 2:57 PM    [place]

## 2020-05-09 NOTE — Patient Instructions (Signed)

## 2020-05-13 NOTE — Progress Notes (Addendum)
[name]   [phone] Fax:(336) [phone]   Clinic Follow up Note   Patient Care Team: [name], MD as PCP - General (Family Medicine) [name], MD as Consulting Physician (Cardiology) [name], MD as Consulting Physician (Gastroenterology) [name], MD as Consulting Physician (General Surgery) [name], MD as Consulting Physician (General Surgery) [name], MD as Consulting Physician (Urology) [name], MD as Consulting Physician (Medical Oncology) 05/14/2020  CHIEF COMPLAINT: Follow up colon cancer  SUMMARY OF ONCOLOGIC HISTORY: Oncology History Overview Note  Cancer Staging Cancer of left colon [facility]) Staging form: Colon and Rectum, AJCC 8th Edition - Pathologic stage from 01/11/2018: Stage IIIB (pT3, pN1c, cM0) - Signed by [name], MD on 01/16/2018     Cancer of left colon ([place])  01/10/2018 Imaging   CT AP W Contrast 01/10/18  IMPRESSION: Irregular soft tissue density causing stricture of the mid descending colon likely the site of obstruction for the dilated small bowel. 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 No evidence of perforation.  Equivocal findings involving the appendix measuring 1.2 cm at the appendiceal tip with mucosal enhancement. No adjacent free fluid or inflammatory change. Findings are nonspecific, but can be seen with early acute appendicitis.  Mild prostatic enlargement. Increased density over the posterior bladder base likely due to the large prostatic impression although cannot completely exclude a bladder mass. Urology protocol CT or ultrasound may be helpful for better evaluation.  Mild cholelithiasis.  Stable 1.5 cm cystic structure over the lower pole right kidney likely slightly hyperdense cyst.  Diverticulosis of the colon.  Aortic Atherosclerosis (ICD10-I70.0).     01/11/2018 Cancer Staging   Staging form: Colon and Rectum, AJCC 8th Edition -  Pathologic stage from 01/11/2018: Stage IIIB (pT3, pN1c, cM0) - Signed by [name], MD on 01/16/2018   01/11/2018 Surgery   LEFT COLON RESECTION, TAKEDOWN SPLENIC FLEXURE, COLOSTOMY by Dr. [name]    01/11/2018 Procedure   Colonoscopy 01/11/18 by Dr. [name]  - Malignant completely obstructing tumor in the mid descending colon. Tattooed. - Diverticulosis in the sigmoid colon. - Non-bleeding internal hemorrhoids. - No specimens collected.   01/11/2018 Pathology Results   Diagnosis 01/11/18  1. Colon, segmental resection for tumor, descending colon - INVASIVE COLORECTAL ADENOCARCINOMA, 4 CM. - TUMOR EXTENDS INTO PERICOLONIC CONNECTIVE TISSUE. - TUMOR FOCALLY INVOLVES RADIAL MARGIN. - ONE MESENTERIC TUMOR DEPOSIT. - THIRTEEN BENIGN LYMPH NODES (0/13). 2. Colon, segmental resection, splenic flexure - BENIGN COLON. - NO EVIDENCE OF MALIGNANCY .   01/11/2018 Tumor Marker   Baseline CEA at 3.4   01/16/2018 Initial Diagnosis   Cancer of left colon ([name])   01/23/2018 Imaging   CT CHEST WO CONTRAST IMPRESSION: 1. No evidence for metastatic disease within the chest. 2. Small left pleural effusion with underlying opacities which may represent atelectasis. Right basilar atelectasis. 3. Few foci of gas within the upper abdomen in the omentum with surrounding fat stranding, likely postsurgical 4. Aortic Atherosclerosis (ICD10-I70.0).   03/08/2018 - 05/15/2018 Chemotherapy   adjuvant FOLOFX. Due to side effects of neuropahty Oxaliplatin was stopped after 3 cycles and chemo was stopped after 6 cycles. He declined completing 6 months of chemo treatment.     03/19/2018 Imaging   03/19/2018 CT AP IMPRESSION: 1. Interval partial left hemicolectomy and descending colostomy. 2. Heterogeneous soft tissue density along the left anterior renal fascia is most likely postoperative (favor fat necrosis). No well-defined fluid collection. 3. Mild left lower quadrant edema, new since 01/10/2018. This  could be  postoperative. Superimposed sigmoid diverticulitis and/or cystitis cannot be excluded. 4. Subtle hyperenhancing nodule within the anterior bladder dome cannot be excluded. Consider nonemergent cystoscopy. When this is performed, recommend attention to the left ureterovesicular junction and distal left ureter to evaluate questionable soft tissue fullness. 5. Cholelithiasis. 6.  Aortic Atherosclerosis (ICD10-I70.0). 7. Prostatomegaly.   11/13/2018 Imaging   CT CAP WO Contrast 11/13/18  IMPRESSION: 1. Reversal of left lower quadrant colostomy with sigmoid colon anastomosis. No complicating features. No findings for residual or recurrent tumor or metastatic disease involving the chest, abdomen or pelvis without contrast. 2. No acute abdominal/pelvic findings. 3. Gallbladder sludge and gallstones but no findings for acute cholecystitis. 4. Stable anterior abdominal wall hernia. 5. The right testicle is in the right inguinal canal.   10/03/2019 Imaging   CT CAP WO contrast  IMPRESSION: 1. New rounded density interposed between the prostate gland and anterior upper rectal wall could represent adenopathy or local extension of anterior rectal tumor. 2. Marked prostatomegaly, prostate volume 150 cubic cm. 3. Other imaging findings of potential clinical significance: Aortic Atherosclerosis (ICD10-I70.0). Coronary atherosclerosis. Trace right pleural effusion. Suspected cholelithiasis. Nonobstructive left nephrolithiasis. Multilevel lumbar impingement. Bilateral mildly retracted testicles. Hypodense exophytic lesion of the right kidney, most likely to be a cyst.   11/02/2019 Procedure   colonoscopy on 11/02/2019 by Dr. [name] Plan showed normal digital rectal exam, 3 polyps in the rectum, descending colon and cecum, and a prior sigmoid: Anastomosis characterized by erythema.  He found an extrinsic nonobstructing medium-sized mass in the proximal rectum about 4 cm in length, no internal rectal  mass.   Diagnosis Surgical [P], colon, cecum, descending, rectal, polyp (3) - TUBULAR ADENOMA (TWO) - NO HIGH GRADE DYSPLASIA OR CARCINOMA. - COLONIC FRAGMENT WITH BENIGN LYMPHOID AGGREGATE.   12/07/2019 Imaging   MRI pelvis IMPRESSION: 1. Masslike area in the rectum suspicious for rectal neoplasm, likely T4b based on the appearance of soft tissue extending along the anterior peritoneal reflection and into the seminal vesicles. Correlation with recent colonoscopy results may be helpful. Area of anastomosis and other areas of the pelvis are not imaged on today's exam.   12/21/2019 PET scan   IMPRESSION: 1. Unfortunately evidence for peritoneal metastasis. Intensely hypermetabolic nodules along the LEFT pericolic gutter. Favor hypermetabolic mass anterior to the rectum to represent serosal implant along the ventral surface of the rectum. 2. local recurrence within the LEFT abdominal wall at site prior colostomy. Intense hypermetabolic thickening of the rectus muscle at this site. 3. Two hypermetabolic hepatic metastasis.   01/15/2020 Relapse/Recurrence   FINAL MICROSCOPIC DIAGNOSIS:   A. SOFT TISSUE, LEFT ABDOMINAL WALL, BIOPSY:  - Adenocarcinoma.  - See comment.   COMMENT:   The morphology is consistent with metastatic colorectal adenocarcinoma.    01/28/2020 -  Chemotherapy   First line FOLFIRI q2weeks starting 01/28/20.Avastin added with C2.    05/05/2020 Imaging   IMPRESSION: 1. Improved appearance, with reduced size of the hepatic metastatic lesions and reduced size of the peritoneal tumor implants. 2. Other imaging findings of potential clinical significance: Notable prostatomegaly. Multilevel impingement in the lumbar spine. Dependent density in the gallbladder possibly from sludge or gallstones. Degenerative glenohumeral arthropathy bilaterally. 3. Aortic atherosclerosis.     CURRENT THERAPY: First line FOLFIRI q2 weeks, starting 01/28/20   INTERVAL HISTORY: Mr.  Rosemond returns for follow up and treatment as scheduled. He completed 7 cycles of FOLFIRI and underwent restaging CT on 05/05/20. He has more fatigue, nausea, and diarrhea with each cycle  from days 3 to 5. Manageable with anti-emetics and lomotil. Po intake is adequate. He is either working or in [employer], occupational. Otherwise, denies new pain, fever, chills, cough, chest pain, dyspnea, leg edema, mucositis, bleeding, neuropathy, or other new concerns.    MEDICAL HISTORY:  Past Medical History:  Diagnosis Date  . Adenocarcinoma, colon ([place]) dx'd 01/2018  . Anemia    taking iron supplements  . Anxiety   . Atrial fibrillation with RVR ([place])   . Colonic obstruction ([place]) 01/10/2018  . Depression   . Diverticulitis   . Dysrhythmia    afib  . History of kidney stones   . Hypertension     SURGICAL HISTORY: Past Surgical History:  Procedure Laterality Date  . ANKLE SURGERY Left    when he was in college  . COLON RESECTION N/A 01/11/2018   Procedure: LEFT COLON RESECTION, TAKEDOWN SPLENIC FLEXURE, COLOSTOMY;  Surgeon: [name], MD;  Location: [facility];  Service: General;  Laterality: N/A;  . COLONOSCOPY  01/11/2018   Procedure: COLONOSCOPY;  Surgeon: Jackquline Denmark, MD;  Location: [facility];  Service: Endoscopy;;  . COLONOSCOPY  05/10/2018  . colonscopy  05/10/2018  . HERNIA REPAIR    . HERNIA REPAIR  03/02/2019   EXPLORATORY LAPAROTOMY (N/A Abdomen)  . INCISIONAL HERNIA REPAIR N/A 03/02/2019   Procedure: INCISIONAL HERNIA REPAIR , RECTORECTUS VS TAR HERNIA REPAIR;  Surgeon: Ralene Ok, MD;  Location: [place];  Service: General;  Laterality: N/A;  . INSERTION OF MESH N/A 03/02/2019   Procedure: Insertion Of Mesh;  Surgeon: Ralene Ok, MD;  Location: [place];  Service: General;  Laterality: N/A;  . LAPAROTOMY N/A 03/02/2019   Procedure: EXPLORATORY LAPAROTOMY;  Surgeon: Ralene Ok, MD;  Location: [place];  Service: General;  Laterality: N/A;  . LYSIS OF ADHESION N/A 06/12/2018   Procedure:  LYSIS OF ADHESIONS;  Surgeon: [name], MD;  Location: [facility];  Service: General;  Laterality: N/A;  . LYSIS OF ADHESION N/A 03/02/2019   Procedure: Lysis Of Adhesion;  Surgeon: Ralene Ok, MD;  Location: [place];  Service: General;  Laterality: N/A;  . PORTACATH PLACEMENT Right 03/07/2018   Procedure: INSERTION PORT-A-CATH RIGHT SUBCLAVIAN;  Surgeon: [name], MD;  Location: [place];  Service: General;  Laterality: Right;  . PROCTOSCOPY N/A 06/12/2018   Procedure: RIGID PROCTOSCOPY;  Surgeon: [name], MD;  Location: [facility];  Service: General;  Laterality: N/A;  . thumb surgery   2018   cyst removal    I have reviewed the social history and family history with the patient and they are unchanged from previous note.  ALLERGIES:  has No Known Allergies.  MEDICATIONS:  Current Outpatient Medications  Medication Sig Dispense Refill  . apixaban (ELIQUIS) 5 MG TABS tablet Take 1 tablet (5 mg total) by mouth 2 (two) times daily. 60 tablet 0  . b complex vitamins tablet Take 1 tablet by mouth daily.    [name] diltiazem (CARDIZEM CD) 360 MG 24 hr capsule Take 1 capsule (360 mg total) by mouth daily. 90 capsule 3  . diphenoxylate-atropine (LOMOTIL) 2.5-0.025 MG tablet Take 1-2 tablets by mouth 4 (four) times daily as needed for diarrhea or loose stools. 60 tablet 1  . docusate sodium (COLACE) 100 MG capsule Take 100 mg by mouth 2 (two) times daily.    [name] levothyroxine (SYNTHROID) 75 MCG tablet Take 1 tablet (75 mcg total) by mouth daily. 90 tablet 1  . Multiple Vitamins-Iron (MULTIVITAMIN/IRON PO) Take 1 tablet by mouth daily.     [name]  prochlorperazine (COMPAZINE) 10 MG tablet Take 1 tablet (10 mg total) by mouth every 6 (six) hours as needed for nausea or vomiting. 30 tablet 0  . tamsulosin (FLOMAX) 0.4 MG CAPS capsule Take 0.4 mg by mouth 2 (two) times daily.     . capecitabine (XELODA) 500 MG tablet Take 4 tabs every 12 hours for 14 days then off 7 days 112 tablet 1   No current  facility-administered medications for this visit.    PHYSICAL EXAMINATION: ECOG PERFORMANCE STATUS: 1 - Symptomatic but completely ambulatory  Vitals:   05/14/20 0920  BP: 119/68  Pulse: 88  Resp: 20  Temp: (!) 97.5 F (36.4 C)  SpO2: 98%   Filed Weights   05/14/20 0920  Weight: 234 lb 8 oz (106.4 kg)    GENERAL:alert, no distress and comfortable LUNGS: normal breathing effort NEURO: alert & oriented x 3 with fluent speech, no focal motor/sensory deficits Exam limited to observation during video visit   LABORATORY DATA:  I have reviewed the data as listed CBC Latest Ref Rng & Units 05/14/2020 04/28/2020 04/14/2020  WBC 4.0 - 10.5 K/uL 4.0 4.5 4.6  Hemoglobin 13.0 - 17.0 g/dL 12.1(L) 13.0 12.6(L)  Hematocrit 39 - 52 % 36.2(L) 38.9(L) 37.6(L)  Platelets 150 - 400 K/uL 208 207 261     CMP Latest Ref Rng & Units 05/14/2020 04/28/2020 04/14/2020  Glucose 70 - 99 mg/dL 109(H) 132(H) 98  BUN 8 - 23 mg/dL 23 20 22   Creatinine 0.61 - 1.24 mg/dL 1.29(H) 1.37(H) 1.29(H)  Sodium 135 - 145 mmol/L 140 141 142  Potassium 3.5 - 5.1 mmol/L 4.0 3.8 4.2  Chloride 98 - 111 mmol/L 107 108 109  CO2 22 - 32 mmol/L 24 25 24   Calcium 8.9 - 10.3 mg/dL 9.0 9.0 8.9  Total Protein 6.5 - 8.1 g/dL 6.6 6.8 6.5  Total Bilirubin 0.3 - 1.2 mg/dL 0.6 0.5 0.5  Alkaline Phos 38 - 126 U/L 81 86 80  AST 15 - 41 U/L 19 16 18   ALT 0 - 44 U/L 17 18 15       RADIOGRAPHIC STUDIES: I have personally reviewed the radiological images as listed and agreed with the findings in the report. No results found.   ASSESSMENT & PLAN: Dave Mccannon Bennettis a 74y.o.malewith    1. Cancer of left colon,adenocarcinoma, stage IIIB(pT3N1cM0), MSI-stable, KRAS+ -Diagnosed in 01/2018. Treated with surgery andadjuvantFOLFOX. Due to side effects Oxaliplatin was stopped after 3 cycles and chemo was stopped after 6 cycles. He declined completing 6 months of chemo treatment. -He underwent hernia repair in 02/2019. He  recovered well.  -SurveillanceCT CAP from4/28/21shows no definitive evidence of metastasis, except a soft tissue density in between prostate and rectum, indeterminate.  -colonoscopy in December 2019 showed mucosal inflammation in the rectum, biopsy was negative. -Rising CEA -Repeat colonoscopy on 11/02/2019 by Dr. [name] Plan showed normal digital rectal exam, 3 polyps in the rectum, descending colon and cecum, and a prior sigmoid: Anastomosis characterized by erythema.He found an extrinsic nonobstructing medium-sized mass in the proximal rectum about 4 cm in length,no internal rectal mass. -He underwent pelvic MRI on 12/06/2019 which was reviewed in our tumor board, showing a masslike area in the rectum suspicious for rectal neoplasm, likely T4b based on the appearance of soft tissue extending along the anterior peritoneal reflection and into the seminal vesicles.  -His PET from 12/21/2019 shows peritoneal metastasis, 2 hypermetabolic hepatic metastases, and local recurrence within the left abdominal wallwhich was palpable on exam 01/28/20.His ultrasound  biopsy of the left abdominal wall from 01/15/2020 shows metastatic colorectal adenocarcinoma. -It was discussed that this is recurrent metastatic stage IV colon cancer, not resectable but still treatable. Given his good nutrition and PS he will likely tolerate treatment well. The goal is palliative -Began first line dose-reducedFOLFIRIon8/23/2021 -FoundationOneshowed Kras (+)positive, he is not a candidate for EGFR inhibitor. Bevacizumab was added with cycle 2  -S/p 7 cycles of FOLFIRI and bevacizumab from cycle 2; tolerating well with fatigue, nausea, diarrhea. CEA normalized after cycle 4 -CT AP 05/05/20 showed marked improvement in liver and peritoneal metastases. Plan to complete 1 more cycle of FOLFIRI/avastin then change to maintenance Xeloda and avastin starting 06/02/20  2. Hypothyroidism -Previously on synthroid, TSH normal 2 years  ago. He has not been taking synthroid for a while -TSH in 02/2020 up to 67, urgent message sent to PCP for management  3.BPH  -Followed by urologist Dr. Lovena Neighbours -Pelvic MRI on 7/1 shows marked prostatomegaly with signs of BPH extending into the bladder base.  -Denies new or worsening urinary symptoms  4. Atrial Fibrillation -continue Eliquis. Rate controlled.He can stop Eliquis periodically for bleeding hemorrhoids -Continue follow-up with cardiology  5.CKD stage III -he has developed mild increased Cr since he started chemo, EGFR around 40-50's -I encouraged him to control his blood pressure, cholesterol and BG. I also encouraged him to drink plenty of water. -Will avoid NSAIDs and CT IV contrast if GFR low  Disposition:  Mr. Eastman appears stable. He completed 7 cycles of FOLFIRI and bevacizumab. He tolerates treatment well with fatigue, nausea, and diarrhea, but getting more difficult. For indigestion he can take Tums BID or OTC prilosec. Side effects are well managed with supportive meds at home. He is able to recover and function well.   We reviewed his restaging CT AP which shows very good response to treatment, close to 50% reduction in size of liver metastases and peritoneal mets are smaller and less conspicuous. No new or progressive disease. CEA has normalized.   We discussed continuing FOLFIRI/avastin for 2 additional months, to complete 6 months, vs change to maintenance 5FU or Xeloda plus avastin.  Mr. Camino prefers to change to oral Xeloda sooner. He understands the risk of cancer progression on maintenance treatment. We will restage after 3 months and follow CEA which has been informative.   The plan to proceed with FOLFIRI and bevacizumab with dose-reduced irinotecan today. Labs reviewed. He will change to bevacizumab q3 weeks and begin Xeloda 2 weeks on/1 week off starting 12/27. Chemotherapy consent: Side effects including but limited to fatigue, nausea/vomiting,  diarrhea, skin toxicity with hand/foot syndrome, neuropathy, fluid retention, renal and kidney dysfunction, neutropenic fever, need for blood transfusion, bleeding, were discussed with patient in great detail. He agrees to proceed. Goal remains palliative. Our pharmacist will reach out to go over Xeloda.   Follow up 12/27 to start maintenance Xeloda and bevacizumab (q3 weeks). The patient was seen with Dr. Burr Medico.   Questions were answered. The patient knows to call the clinic with any problems, questions or concerns. No barriers to learning were detected.     Alla Feeling, NP 05/14/20   Addendum  I have seen the patient, examined him. I agree with the assessment and and plan and have edited the notes.   I personally reviewed and discussed his restaging CT with pt and his wife. He has had exellent partial response. He has became more fatigued lately from chemotherapy, and wished to change to maintenance therapy.  I think this is reasonable.  We will proceed with last cycle FOLFIRI with slight dose reduction today, and change to Xeloda with bevacizumab on next cycle on 12/27.  All questions were answered. I called in Xeloda to [place] today.   [name]  05/14/2020

## 2020-05-14 ENCOUNTER — Inpatient Hospital Stay (HOSPITAL_BASED_OUTPATIENT_CLINIC_OR_DEPARTMENT_OTHER): Payer: Commercial Managed Care - PPO | Admitting: Nurse Practitioner

## 2020-05-14 ENCOUNTER — Telehealth: Payer: Self-pay | Admitting: Pharmacist

## 2020-05-14 ENCOUNTER — Inpatient Hospital Stay: Payer: Commercial Managed Care - PPO

## 2020-05-14 ENCOUNTER — Encounter: Payer: Self-pay | Admitting: Nurse Practitioner

## 2020-05-14 ENCOUNTER — Telehealth: Payer: Self-pay

## 2020-05-14 ENCOUNTER — Inpatient Hospital Stay: Payer: Commercial Managed Care - PPO | Attending: Nurse Practitioner

## 2020-05-14 ENCOUNTER — Other Ambulatory Visit: Payer: Self-pay

## 2020-05-14 VITALS — BP 120/87 | HR 81

## 2020-05-14 VITALS — BP 119/68 | HR 88 | Temp 97.5°F | Resp 20 | Ht 71.0 in | Wt 234.5 lb

## 2020-05-14 DIAGNOSIS — Z5111 Encounter for antineoplastic chemotherapy: Secondary | ICD-10-CM | POA: Diagnosis present

## 2020-05-14 DIAGNOSIS — I4891 Unspecified atrial fibrillation: Secondary | ICD-10-CM | POA: Diagnosis not present

## 2020-05-14 DIAGNOSIS — Z7189 Other specified counseling: Secondary | ICD-10-CM

## 2020-05-14 DIAGNOSIS — N400 Enlarged prostate without lower urinary tract symptoms: Secondary | ICD-10-CM | POA: Insufficient documentation

## 2020-05-14 DIAGNOSIS — E039 Hypothyroidism, unspecified: Secondary | ICD-10-CM | POA: Insufficient documentation

## 2020-05-14 DIAGNOSIS — C786 Secondary malignant neoplasm of retroperitoneum and peritoneum: Secondary | ICD-10-CM | POA: Diagnosis not present

## 2020-05-14 DIAGNOSIS — C787 Secondary malignant neoplasm of liver and intrahepatic bile duct: Secondary | ICD-10-CM | POA: Diagnosis not present

## 2020-05-14 DIAGNOSIS — C186 Malignant neoplasm of descending colon: Secondary | ICD-10-CM

## 2020-05-14 DIAGNOSIS — I129 Hypertensive chronic kidney disease with stage 1 through stage 4 chronic kidney disease, or unspecified chronic kidney disease: Secondary | ICD-10-CM | POA: Insufficient documentation

## 2020-05-14 DIAGNOSIS — Z7901 Long term (current) use of anticoagulants: Secondary | ICD-10-CM | POA: Insufficient documentation

## 2020-05-14 DIAGNOSIS — Z452 Encounter for adjustment and management of vascular access device: Secondary | ICD-10-CM | POA: Insufficient documentation

## 2020-05-14 DIAGNOSIS — N183 Chronic kidney disease, stage 3 unspecified: Secondary | ICD-10-CM | POA: Insufficient documentation

## 2020-05-14 DIAGNOSIS — Z5112 Encounter for antineoplastic immunotherapy: Secondary | ICD-10-CM | POA: Diagnosis not present

## 2020-05-14 DIAGNOSIS — Z95828 Presence of other vascular implants and grafts: Secondary | ICD-10-CM

## 2020-05-14 LAB — CBC WITH DIFFERENTIAL (CANCER CENTER ONLY)
Abs Immature Granulocytes: 0.02 10*3/uL (ref 0.00–0.07)
Basophils Absolute: 0 10*3/uL (ref 0.0–0.1)
Basophils Relative: 1 %
Eosinophils Absolute: 0.1 10*3/uL (ref 0.0–0.5)
Eosinophils Relative: 4 %
HCT: 36.2 % — ABNORMAL LOW (ref 39.0–52.0)
Hemoglobin: 12.1 g/dL — ABNORMAL LOW (ref 13.0–17.0)
Immature Granulocytes: 1 %
Lymphocytes Relative: 35 %
Lymphs Abs: 1.4 10*3/uL (ref 0.7–4.0)
MCH: 31.3 pg (ref 26.0–34.0)
MCHC: 33.4 g/dL (ref 30.0–36.0)
MCV: 93.8 fL (ref 80.0–100.0)
Monocytes Absolute: 0.5 10*3/uL (ref 0.1–1.0)
Monocytes Relative: 13 %
Neutro Abs: 1.9 10*3/uL (ref 1.7–7.7)
Neutrophils Relative %: 46 %
Platelet Count: 208 10*3/uL (ref 150–400)
RBC: 3.86 MIL/uL — ABNORMAL LOW (ref 4.22–5.81)
RDW: 16.4 % — ABNORMAL HIGH (ref 11.5–15.5)
WBC Count: 4 10*3/uL (ref 4.0–10.5)
nRBC: 0 % (ref 0.0–0.2)

## 2020-05-14 LAB — CMP (CANCER CENTER ONLY)
ALT: 17 U/L (ref 0–44)
AST: 19 U/L (ref 15–41)
Albumin: 3.5 g/dL (ref 3.5–5.0)
Alkaline Phosphatase: 81 U/L (ref 38–126)
Anion gap: 9 (ref 5–15)
BUN: 23 mg/dL (ref 8–23)
CO2: 24 mmol/L (ref 22–32)
Calcium: 9 mg/dL (ref 8.9–10.3)
Chloride: 107 mmol/L (ref 98–111)
Creatinine: 1.29 mg/dL — ABNORMAL HIGH (ref 0.61–1.24)
GFR, Estimated: 59 mL/min — ABNORMAL LOW (ref >60)
Glucose, Bld: 109 mg/dL — ABNORMAL HIGH (ref 70–99)
Potassium: 4 mmol/L (ref 3.5–5.1)
Sodium: 140 mmol/L (ref 135–145)
Total Bilirubin: 0.6 mg/dL (ref 0.3–1.2)
Total Protein: 6.6 g/dL (ref 6.5–8.1)

## 2020-05-14 LAB — TOTAL PROTEIN, URINE DIPSTICK: Protein, ur: 100 mg/dL — AB

## 2020-05-14 LAB — CEA (IN HOUSE-CHCC): CEA (CHCC-In House): 3.56 ng/mL (ref 0.00–5.00)

## 2020-05-14 MED ORDER — PALONOSETRON HCL INJECTION 0.25 MG/5ML
0.2500 mg | Freq: Once | INTRAVENOUS | Status: AC
  Administered 2020-05-14: 0.25 mg via INTRAVENOUS

## 2020-05-14 MED ORDER — SODIUM CHLORIDE 0.9 % IV SOLN
140.0000 mg/m2 | Freq: Once | INTRAVENOUS | Status: AC
  Administered 2020-05-14: 320 mg via INTRAVENOUS
  Filled 2020-05-14: qty 5

## 2020-05-14 MED ORDER — SODIUM CHLORIDE 0.9 % IV SOLN
2400.0000 mg/m2 | INTRAVENOUS | Status: DC
  Administered 2020-05-14: 5400 mg via INTRAVENOUS
  Filled 2020-05-14: qty 108

## 2020-05-14 MED ORDER — ALTEPLASE 2 MG IJ SOLR
2.0000 mg | Freq: Once | INTRAMUSCULAR | Status: AC | PRN
  Administered 2020-05-14: 2 mg
  Filled 2020-05-14: qty 2

## 2020-05-14 MED ORDER — ALTEPLASE 2 MG IJ SOLR
INTRAMUSCULAR | Status: AC
  Filled 2020-05-14: qty 2

## 2020-05-14 MED ORDER — CAPECITABINE 500 MG PO TABS: ORAL_TABLET | ORAL | 1 refills | Status: DC

## 2020-05-14 MED ORDER — HEPARIN SOD (PORK) LOCK FLUSH 100 UNIT/ML IV SOLN
500.0000 [IU] | Freq: Once | INTRAVENOUS | Status: DC | PRN
  Filled 2020-05-14: qty 5

## 2020-05-14 MED ORDER — SODIUM CHLORIDE 0.9 % IV SOLN
Freq: Once | INTRAVENOUS | Status: AC
  Filled 2020-05-14: qty 250

## 2020-05-14 MED ORDER — SODIUM CHLORIDE 0.9% FLUSH
10.0000 mL | INTRAVENOUS | Status: DC | PRN
  Administered 2020-05-14: 10 mL
  Filled 2020-05-14: qty 10

## 2020-05-14 MED ORDER — SODIUM CHLORIDE 0.9% FLUSH
10.0000 mL | INTRAVENOUS | Status: DC | PRN
  Filled 2020-05-14: qty 10

## 2020-05-14 MED ORDER — SODIUM CHLORIDE 0.9 % IV SOLN
400.0000 mg/m2 | Freq: Once | INTRAVENOUS | Status: AC
  Administered 2020-05-14: 900 mg via INTRAVENOUS
  Filled 2020-05-14: qty 45

## 2020-05-14 MED ORDER — PALONOSETRON HCL INJECTION 0.25 MG/5ML
INTRAVENOUS | Status: AC
  Filled 2020-05-14: qty 5

## 2020-05-14 MED ORDER — ATROPINE SULFATE 1 MG/ML IJ SOLN
INTRAMUSCULAR | Status: AC
  Filled 2020-05-14: qty 1

## 2020-05-14 MED ORDER — SODIUM CHLORIDE 0.9 % IV SOLN
10.0000 mg | Freq: Once | INTRAVENOUS | Status: AC
  Administered 2020-05-14: 10 mg via INTRAVENOUS
  Filled 2020-05-14: qty 10

## 2020-05-14 MED ORDER — SODIUM CHLORIDE 0.9 % IV SOLN
5.0000 mg/kg | Freq: Once | INTRAVENOUS | Status: AC
  Administered 2020-05-14: 500 mg via INTRAVENOUS
  Filled 2020-05-14: qty 16

## 2020-05-14 MED ORDER — ATROPINE SULFATE 1 MG/ML IJ SOLN
0.5000 mg | Freq: Once | INTRAMUSCULAR | Status: AC | PRN
  Administered 2020-05-14: 0.5 mg via INTRAVENOUS

## 2020-05-14 NOTE — Patient Instructions (Signed)

## 2020-05-14 NOTE — Progress Notes (Signed)
No blood return noted when patient arrived to infusion. Blood return previously noted for flush RN. Cath flo instilled for an hour and a half with no blood return. Discussed with MD who decided to proceed with treatment today. Additionally discussed urine protein of 100 today, and MD also gave okay to proceed with that lab result.

## 2020-05-14 NOTE — Telephone Encounter (Signed)
Oral Oncology Patient Advocate Encounter  Received notification from [place] that prior authorization for Xeloda is required.  PA submitted on CoverMyMeds Key [id number] Status is pending  Oral Oncology Clinic will continue to follow.  [place] Patient [place] Phone [phone] Fax [phone] 05/14/2020 10:15 AM

## 2020-05-14 NOTE — Patient Instructions (Signed)
[facility] Discharge Instructions for Patients Receiving Chemotherapy  Today you received the following chemotherapy agents: Avastin, Irinotecan, Leucovorin, and Fluorouracil  To help prevent nausea and vomiting after your treatment, we encourage you to take your nausea medication  as prescribed.    If you develop nausea and vomiting that is not controlled by your nausea medication, call the clinic.   BELOW ARE SYMPTOMS THAT SHOULD BE REPORTED IMMEDIATELY:  *FEVER GREATER THAN 100.5 F  *CHILLS WITH OR WITHOUT FEVER  NAUSEA AND VOMITING THAT IS NOT CONTROLLED WITH YOUR NAUSEA MEDICATION  *UNUSUAL SHORTNESS OF BREATH  *UNUSUAL BRUISING OR BLEEDING  TENDERNESS IN MOUTH AND THROAT WITH OR WITHOUT PRESENCE OF ULCERS  *URINARY PROBLEMS  *BOWEL PROBLEMS  UNUSUAL RASH Items with * indicate a potential emergency and should be followed up as soon as possible.  Feel free to call the clinic should you have any questions or concerns. The clinic phone number is [phone].  Please show the [facility] at check-in to the Emergency Department and triage nurse.

## 2020-05-14 NOTE — Telephone Encounter (Signed)
Oral Oncology Pharmacist Encounter  Received new prescription for Xeloda (capecitabine) for the treatment of stage IV colon cancer in conjunction with bevacizumab, planned duration until disease progression or unacceptable drug toxicity.  Prescription dose and frequency assessed for appropriateness. Appropriate for therapy initiation.   CBC w/ Diff and CMP from 05/14/20 assessed, noted Scr of 1.29 mg/dL (CrCl ~59 mL/min) - no baseline dose adjustments required.  Current medication list in Epic reviewed, no relevant/significant DDIs with Xeloda identified.  Evaluated chart and no patient barriers to medication adherence noted.   Patient's insurance requires that Xeloda be filled through [employer] Rx has been redirected to [facility] for dispensing.  Oral Oncology Clinic will continue to follow for insurance authorization, copayment issues, initial counseling and start date.  Leron Croak, PharmD, BCPS Hematology/Oncology Clinical Pharmacist [facility] [phone] 05/14/2020 10:49 AM

## 2020-05-14 NOTE — Telephone Encounter (Addendum)
Oral Oncology Patient Advocate Encounter  Prior Authorization for Xeloda has been approved.    PA# [id number] Effective dates: 05/14/20 through 05/14/21  Patient must fill at [facility] will continue to follow.   [place] Patient [place] Phone [phone] Fax [phone] 05/14/2020 11:08 AM

## 2020-05-15 ENCOUNTER — Telehealth: Payer: Self-pay | Admitting: Hematology

## 2020-05-15 NOTE — Telephone Encounter (Signed)
Updated appointments per 12/8 los. Called patient, no answer. Left message for patient with updated appointments dates and times.

## 2020-05-16 ENCOUNTER — Inpatient Hospital Stay: Payer: Commercial Managed Care - PPO

## 2020-05-16 ENCOUNTER — Encounter: Payer: Self-pay | Admitting: Nurse Practitioner

## 2020-05-16 ENCOUNTER — Other Ambulatory Visit: Payer: Self-pay

## 2020-05-16 VITALS — BP 107/76 | HR 92

## 2020-05-16 DIAGNOSIS — C186 Malignant neoplasm of descending colon: Secondary | ICD-10-CM

## 2020-05-16 DIAGNOSIS — Z5112 Encounter for antineoplastic immunotherapy: Secondary | ICD-10-CM | POA: Diagnosis not present

## 2020-05-16 DIAGNOSIS — Z7189 Other specified counseling: Secondary | ICD-10-CM

## 2020-05-16 MED ORDER — HEPARIN SOD (PORK) LOCK FLUSH 100 UNIT/ML IV SOLN
500.0000 [IU] | Freq: Once | INTRAVENOUS | Status: AC | PRN
  Administered 2020-05-16: 500 [IU]
  Filled 2020-05-16: qty 5

## 2020-05-16 MED ORDER — SODIUM CHLORIDE 0.9% FLUSH
10.0000 mL | INTRAVENOUS | Status: DC | PRN
  Administered 2020-05-16: 10 mL
  Filled 2020-05-16: qty 10

## 2020-05-21 ENCOUNTER — Ambulatory Visit: Payer: Commercial Managed Care - PPO | Admitting: Family Medicine

## 2020-05-22 NOTE — Telephone Encounter (Signed)
Oral Chemotherapy Pharmacist Encounter  I spoke with patient for overview of: Xeloda (capecitabine) for the treatment of stage IV colon cancer in conjunction with bevacizumab, planned duration until disease progression or unacceptable drug toxicity.  Counseled patient on administration, dosing, side effects, monitoring, drug-food interactions, safe handling, storage, and disposal.  Patient will take Xeloda 500mg  tablets, 4 tablets (2000mg ) by mouth in AM and 4 tabs (2000mg ) by mouth in PM, within 30 minutes of finishing meals, on days 1-14 of each 21 day cycle.   Xeloda start date: 06/02/20  Adverse effects include but are not limited to: fatigue, decreased blood counts, GI upset, diarrhea, mouth sores, and hand-foot syndrome.  Patient will obtain anti diarrheal and alert the office of 4 or more loose stools above baseline.  Reviewed with patient importance of keeping a medication schedule and plan for any missed doses. No barriers to medication adherence identified.  Medication reconciliation performed and medication/allergy list updated.  Insurance authorization for Xeloda has been obtained. Test claim at the pharmacy revealed copayment $0 for 1st fill of Xeloda. Patient's insurance requires that Xeloda be filled through [employer] This will be shipped from [facility] for delivery to patient's home on 05/23/20. Patient aware and provided pharmacy's phone number [phone])  All questions answered.  Dave Vaughn voiced understanding and appreciation.   Medication education handout and medication calendar placed in mail for patient. Patient knows to call the office with questions or concerns. Oral Chemotherapy Clinic phone number provided to patient.   Leron Croak, PharmD, BCPS Hematology/Oncology Clinical Pharmacist [facility] [phone] 05/22/2020 10:35 AM

## 2020-05-26 ENCOUNTER — Ambulatory Visit (INDEPENDENT_AMBULATORY_CARE_PROVIDER_SITE_OTHER): Payer: Commercial Managed Care - PPO | Admitting: Family Medicine

## 2020-05-26 ENCOUNTER — Other Ambulatory Visit: Payer: Self-pay

## 2020-05-26 ENCOUNTER — Encounter: Payer: Self-pay | Admitting: Family Medicine

## 2020-05-26 VITALS — BP 116/76 | HR 86 | Temp 98.1°F | Ht 71.0 in | Wt 232.3 lb

## 2020-05-26 DIAGNOSIS — C189 Malignant neoplasm of colon, unspecified: Secondary | ICD-10-CM | POA: Diagnosis not present

## 2020-05-26 DIAGNOSIS — N1831 Chronic kidney disease, stage 3a: Secondary | ICD-10-CM

## 2020-05-26 DIAGNOSIS — Z7901 Long term (current) use of anticoagulants: Secondary | ICD-10-CM

## 2020-05-26 DIAGNOSIS — I482 Chronic atrial fibrillation, unspecified: Secondary | ICD-10-CM | POA: Diagnosis not present

## 2020-05-26 DIAGNOSIS — Z13220 Encounter for screening for lipoid disorders: Secondary | ICD-10-CM

## 2020-05-26 DIAGNOSIS — R739 Hyperglycemia, unspecified: Secondary | ICD-10-CM

## 2020-05-26 DIAGNOSIS — J3489 Other specified disorders of nose and nasal sinuses: Secondary | ICD-10-CM

## 2020-05-26 DIAGNOSIS — Z1159 Encounter for screening for other viral diseases: Secondary | ICD-10-CM

## 2020-05-26 DIAGNOSIS — E039 Hypothyroidism, unspecified: Secondary | ICD-10-CM

## 2020-05-26 MED ORDER — SHINGRIX 50 MCG/0.5ML IM SUSR: 0.5000 mL | Freq: Once | INTRAMUSCULAR | 0 refills | Status: AC

## 2020-05-26 MED ORDER — LEVOTHYROXINE SODIUM 75 MCG PO TABS: 75.0000 ug | ORAL_TABLET | Freq: Every day | ORAL | 1 refills | Status: DC

## 2020-05-26 NOTE — Progress Notes (Signed)
Dave Vaughn DOB: 26-Jun-1945 Encounter date: 05/26/2020  This is a 74 y.o. male who presents with Chief Complaint  Patient presents with  . Follow-up    History of present illness: Waking in morning with sinus congestion; small amount blood. No epistaxis during day, no sinus pain.   Following with cardiology for permanent A. Fib.  Recent knee they increased his Cardizem to 360 mg daily dose and heart rate did improve on this.  On Eliquis.  Colon cancer: Following with oncology palliative care for this.  Recently had CT for restaging prostate enlargement noted on this. Follows with Dr. Lovena Neighbours (urology) - last visit in last month or so. Chemo 1 day every 3 weeks. Treatments have been rough, but feels good today. Still working, still exercising.   CKD stage IIIa: creat has been stable on routine bloodwork through hematology.   Plans to get flu shot and booster vaccination after holidays. .  No Known Allergies Current Meds  Medication Sig  . apixaban (ELIQUIS) 5 MG TABS tablet Take 1 tablet (5 mg total) by mouth 2 (two) times daily.  [name] b complex vitamins tablet Take 1 tablet by mouth daily.  . capecitabine (XELODA) 500 MG tablet Take 4 tablets (2000 mg total) by mouth every 12 hours. Take within 30 minutes of AM and PM meals. Take for 14 days on, then 7 days off. Repeat every 21 days.  [name] diltiazem (CARDIZEM CD) 360 MG 24 hr capsule Take 1 capsule (360 mg total) by mouth daily.  . diphenoxylate-atropine (LOMOTIL) 2.5-0.025 MG tablet Take 1-2 tablets by mouth 4 (four) times daily as needed for diarrhea or loose stools.  . docusate sodium (COLACE) 100 MG capsule Take 100 mg by mouth 2 (two) times daily.  [name] levothyroxine (SYNTHROID) 75 MCG tablet Take 1 tablet (75 mcg total) by mouth daily.  . Multiple Vitamins-Iron (MULTIVITAMIN/IRON PO) Take 1 tablet by mouth daily.   . prochlorperazine (COMPAZINE) 10 MG tablet Take 1 tablet (10 mg total) by mouth every 6 (six) hours as needed for  nausea or vomiting.  . tamsulosin (FLOMAX) 0.4 MG CAPS capsule Take 0.4 mg by mouth 2 (two) times daily.     Review of Systems  Constitutional: Negative for chills, fatigue and fever.  Respiratory: Negative for cough, chest tightness, shortness of breath and wheezing.   Cardiovascular: Negative for chest pain, palpitations and leg swelling.    Objective:  BP 116/76 (BP Location: Right Arm, Patient Position: Sitting, Cuff Size: Large)   Pulse 86   Temp 98.1 F (36.7 C) (Oral)   Ht 5\' 11"  (1.803 m)   Wt 232 lb 4.8 oz (105.4 kg)   BMI 32.40 kg/m   Weight: 232 lb 4.8 oz (105.4 kg)   BP Readings from Last 3 Encounters:  05/26/20 116/76  05/16/20 107/76  05/14/20 120/87   Wt Readings from Last 3 Encounters:  05/26/20 232 lb 4.8 oz (105.4 kg)  05/14/20 234 lb 8 oz (106.4 kg)  05/09/20 233 lb (105.7 kg)    Physical Exam Constitutional:      General: He is not in acute distress.    Appearance: He is well-developed.  HENT:     Nose:     Comments: Medial membrane right there is some erosion/erythema mucosa. Not actively bleeding.  Cardiovascular:     Rate and Rhythm: Normal rate. Rhythm irregularly irregular.     Heart sounds: Normal heart sounds. No murmur heard. No friction rub.  Pulmonary:     Effort:  Pulmonary effort is normal. No respiratory distress.     Breath sounds: Normal breath sounds. No wheezing or rales.  Musculoskeletal:     Right lower leg: No edema.     Left lower leg: No edema.  Neurological:     Mental Status: He is alert and oriented to person, place, and time.  Psychiatric:        Behavior: Behavior normal.     Assessment/Plan  States he will consider pnumonia vaccine at next visit. Also discussed shingrix vaccination..   1. Chronic atrial fibrillation Followed by cardio; HR controlled on cardizem. Continues on eliquis.   2. Stage 3a chronic kidney disease (HCC) Creat has been stable.   3. Chronic anticoagulation For a fib; on  elliquis.  4. 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 Discussed importance of preventative care (like immunizations) to help maintain health.   5. Dry nose Advised vasoline in nares at bedtime. Let me know if bleeding does not stop.  6. Hyperglycemia - Hemoglobin A1c; Future  7. Lipid screening - Lipid panel; Future  8. Hypothyroidism, unspecified type - TSH; Future - levothyroxine (SYNTHROID) 75 MCG tablet; Take 1 tablet (75 mcg total) by mouth daily.  Dispense: 90 tablet; Refill: 1  9. Encounter for hepatitis C screening test for low risk patient - Hepatitis C antibody; Future   Return in about 6 months (around 11/24/2020) for physical exam.        Micheline Rough, MD

## 2020-05-26 NOTE — Patient Instructions (Addendum)
We can complete your pneumonia vaccination at next visit.    [phone] if you want to reschedule with [facility] dermatology

## 2020-06-01 ENCOUNTER — Other Ambulatory Visit: Payer: Self-pay | Admitting: Oncology

## 2020-06-01 DIAGNOSIS — C186 Malignant neoplasm of descending colon: Secondary | ICD-10-CM

## 2020-06-01 NOTE — Progress Notes (Signed)
[place]   Telephone:(336) [phone] Fax:(336) [phone]   Clinic Follow up Note   Patient Care Team: [name], MD as PCP - General (Family Medicine) [name], MD as Consulting Physician (Cardiology) [name], MD as Consulting Physician (Gastroenterology) [name], MD as Consulting Physician (General Surgery) [name], MD as Consulting Physician (General Surgery) [name], MD as Consulting Physician (Urology) [name], MD as Consulting Physician (Medical Oncology) 06/02/2020  CHIEF COMPLAINT: Follow up colon cancer   SUMMARY OF ONCOLOGIC HISTORY: Oncology History Overview Note  Cancer Staging Cancer of left colon [facility]) Staging form: Colon and Rectum, AJCC 8th Edition - Pathologic stage from 01/11/2018: Stage IIIB (pT3, pN1c, cM0) - Signed by [name], MD on 01/16/2018     Cancer of left colon ([place])  01/10/2018 Imaging   CT AP W Contrast 01/10/18  IMPRESSION: Irregular soft tissue density causing stricture of the mid descending colon likely the site of obstruction for the dilated small bowel. 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 No evidence of perforation.  Equivocal findings involving the appendix measuring 1.2 cm at the appendiceal tip with mucosal enhancement. No adjacent free fluid or inflammatory change. Findings are nonspecific, but can be seen with early acute appendicitis.  Mild prostatic enlargement. Increased density over the posterior bladder base likely due to the large prostatic impression although cannot completely exclude a bladder mass. Urology protocol CT or ultrasound may be helpful for better evaluation.  Mild cholelithiasis.  Stable 1.5 cm cystic structure over the lower pole right kidney likely slightly hyperdense cyst.  Diverticulosis of the colon.  Aortic Atherosclerosis (ICD10-I70.0).     01/11/2018 Cancer Staging   Staging form: Colon and Rectum, AJCC 8th Edition -  Pathologic stage from 01/11/2018: Stage IIIB (pT3, pN1c, cM0) - Signed by [name], MD on 01/16/2018   01/11/2018 Surgery   LEFT COLON RESECTION, TAKEDOWN SPLENIC FLEXURE, COLOSTOMY by Dr. [name]    01/11/2018 Procedure   Colonoscopy 01/11/18 by Dr. [name]  - Malignant completely obstructing tumor in the mid descending colon. Tattooed. - Diverticulosis in the sigmoid colon. - Non-bleeding internal hemorrhoids. - No specimens collected.   01/11/2018 Pathology Results   Diagnosis 01/11/18  1. Colon, segmental resection for tumor, descending colon - INVASIVE COLORECTAL ADENOCARCINOMA, 4 CM. - TUMOR EXTENDS INTO PERICOLONIC CONNECTIVE TISSUE. - TUMOR FOCALLY INVOLVES RADIAL MARGIN. - ONE MESENTERIC TUMOR DEPOSIT. - THIRTEEN BENIGN LYMPH NODES (0/13). 2. Colon, segmental resection, splenic flexure - BENIGN COLON. - NO EVIDENCE OF MALIGNANCY .   01/11/2018 Tumor Marker   Baseline CEA at 3.4   01/16/2018 Initial Diagnosis   Cancer of left colon ([place])   01/23/2018 Imaging   CT CHEST WO CONTRAST IMPRESSION: 1. No evidence for metastatic disease within the chest. 2. Small left pleural effusion with underlying opacities which may represent atelectasis. Right basilar atelectasis. 3. Few foci of gas within the upper abdomen in the omentum with surrounding fat stranding, likely postsurgical 4. Aortic Atherosclerosis (ICD10-I70.0).   03/08/2018 - 05/15/2018 Chemotherapy   adjuvant FOLOFX. Due to side effects of neuropahty Oxaliplatin was stopped after 3 cycles and chemo was stopped after 6 cycles. He declined completing 6 months of chemo treatment.     03/19/2018 Imaging   03/19/2018 CT AP IMPRESSION: 1. Interval partial left hemicolectomy and descending colostomy. 2. Heterogeneous soft tissue density along the left anterior renal fascia is most likely postoperative (favor fat necrosis). No well-defined fluid collection. 3. Mild left lower quadrant edema, new since 01/10/2018.  This could be  postoperative. Superimposed sigmoid diverticulitis and/or cystitis cannot be excluded. 4. Subtle hyperenhancing nodule within the anterior bladder dome cannot be excluded. Consider nonemergent cystoscopy. When this is performed, recommend attention to the left ureterovesicular junction and distal left ureter to evaluate questionable soft tissue fullness. 5. Cholelithiasis. 6.  Aortic Atherosclerosis (ICD10-I70.0). 7. Prostatomegaly.   11/13/2018 Imaging   CT CAP WO Contrast 11/13/18  IMPRESSION: 1. Reversal of left lower quadrant colostomy with sigmoid colon anastomosis. No complicating features. No findings for residual or recurrent tumor or metastatic disease involving the chest, abdomen or pelvis without contrast. 2. No acute abdominal/pelvic findings. 3. Gallbladder sludge and gallstones but no findings for acute cholecystitis. 4. Stable anterior abdominal wall hernia. 5. The right testicle is in the right inguinal canal.   10/03/2019 Imaging   CT CAP WO contrast  IMPRESSION: 1. New rounded density interposed between the prostate gland and anterior upper rectal wall could represent adenopathy or local extension of anterior rectal tumor. 2. Marked prostatomegaly, prostate volume 150 cubic cm. 3. Other imaging findings of potential clinical significance: Aortic Atherosclerosis (ICD10-I70.0). Coronary atherosclerosis. Trace right pleural effusion. Suspected cholelithiasis. Nonobstructive left nephrolithiasis. Multilevel lumbar impingement. Bilateral mildly retracted testicles. Hypodense exophytic lesion of the right kidney, most likely to be a cyst.   11/02/2019 Procedure   colonoscopy on 11/02/2019 by Dr. [name] Plan showed normal digital rectal exam, 3 polyps in the rectum, descending colon and cecum, and a prior sigmoid: Anastomosis characterized by erythema.  He found an extrinsic nonobstructing medium-sized mass in the proximal rectum about 4 cm in length, no internal rectal  mass.   Diagnosis Surgical [P], colon, cecum, descending, rectal, polyp (3) - TUBULAR ADENOMA (TWO) - NO HIGH GRADE DYSPLASIA OR CARCINOMA. - COLONIC FRAGMENT WITH BENIGN LYMPHOID AGGREGATE.   12/07/2019 Imaging   MRI pelvis IMPRESSION: 1. Masslike area in the rectum suspicious for rectal neoplasm, likely T4b based on the appearance of soft tissue extending along the anterior peritoneal reflection and into the seminal vesicles. Correlation with recent colonoscopy results may be helpful. Area of anastomosis and other areas of the pelvis are not imaged on today's exam.   12/21/2019 PET scan   IMPRESSION: 1. Unfortunately evidence for peritoneal metastasis. Intensely hypermetabolic nodules along the LEFT pericolic gutter. Favor hypermetabolic mass anterior to the rectum to represent serosal implant along the ventral surface of the rectum. 2. local recurrence within the LEFT abdominal wall at site prior colostomy. Intense hypermetabolic thickening of the rectus muscle at this site. 3. Two hypermetabolic hepatic metastasis.   01/15/2020 Relapse/Recurrence   FINAL MICROSCOPIC DIAGNOSIS:   A. SOFT TISSUE, LEFT ABDOMINAL WALL, BIOPSY:  - Adenocarcinoma.  - See comment.   COMMENT:   The morphology is consistent with metastatic colorectal adenocarcinoma.    01/28/2020 -  Chemotherapy   First line FOLFIRI q2weeks starting 01/28/20.Avastin added with C2.    05/05/2020 Imaging   IMPRESSION: 1. Improved appearance, with reduced size of the hepatic metastatic lesions and reduced size of the peritoneal tumor implants. 2. Other imaging findings of potential clinical significance: Notable prostatomegaly. Multilevel impingement in the lumbar spine. Dependent density in the gallbladder possibly from sludge or gallstones. Degenerative glenohumeral arthropathy bilaterally. 3. Aortic atherosclerosis.   06/02/2020 -  Chemotherapy   The patient had bevacizumab-bvzr (ZIRABEV) 800 mg in sodium  chloride 0.9 % 100 mL chemo infusion, 7.5 mg/kg = 800 mg, Intravenous,  Once, 0 of 4 cycles  for chemotherapy treatment.      CURRENT THERAPY: First  line FOLFIRI q2 weeks, starting 01/28/20 x8 cycles, changed to maintenance Xeloda and bevacizumab 06/02/20    INTERVAL HISTORY: Mr. Stipp returns for follow up as scheduled. He completed last FOLFIRI and beva on 05/14/20.  He continues to well overall, denies pain. Has periodic diarrhea at baseline. He took 4 tabs Xeloda this morning as instructed. No n/v, fever, chills, dyspnea or new concerns. All other systems were reviewed with the patient and are negative.    MEDICAL HISTORY:  Past Medical History:  Diagnosis Date  . Adenocarcinoma, colon ([place]) dx'd 01/2018  . Anemia    taking iron supplements  . Anxiety   . Atrial fibrillation with RVR ([place])   . Colonic obstruction ([place]) 01/10/2018  . Depression   . Diverticulitis   . Dysrhythmia    afib  . History of kidney stones   . Hypertension     SURGICAL HISTORY: Past Surgical History:  Procedure Laterality Date  . ANKLE SURGERY Left    when he was in college  . COLON RESECTION N/A 01/11/2018   Procedure: LEFT COLON RESECTION, TAKEDOWN SPLENIC FLEXURE, COLOSTOMY;  Surgeon: [name], MD;  Location: [facility];  Service: General;  Laterality: N/A;  . COLONOSCOPY  01/11/2018   Procedure: COLONOSCOPY;  Surgeon: Jackquline Denmark, MD;  Location: [facility];  Service: Endoscopy;;  . COLONOSCOPY  05/10/2018  . colonscopy  05/10/2018  . HERNIA REPAIR    . HERNIA REPAIR  03/02/2019   EXPLORATORY LAPAROTOMY (N/A Abdomen)  . INCISIONAL HERNIA REPAIR N/A 03/02/2019   Procedure: INCISIONAL HERNIA REPAIR , RECTORECTUS VS TAR HERNIA REPAIR;  Surgeon: Ralene Ok, MD;  Location: [place];  Service: General;  Laterality: N/A;  . INSERTION OF MESH N/A 03/02/2019   Procedure: Insertion Of Mesh;  Surgeon: Ralene Ok, MD;  Location: [place];  Service: General;  Laterality: N/A;  . LAPAROTOMY N/A 03/02/2019    Procedure: EXPLORATORY LAPAROTOMY;  Surgeon: Ralene Ok, MD;  Location: [place];  Service: General;  Laterality: N/A;  . LYSIS OF ADHESION N/A 06/12/2018   Procedure: LYSIS OF ADHESIONS;  Surgeon: [name], MD;  Location: [facility];  Service: General;  Laterality: N/A;  . LYSIS OF ADHESION N/A 03/02/2019   Procedure: Lysis Of Adhesion;  Surgeon: Ralene Ok, MD;  Location: [place];  Service: General;  Laterality: N/A;  . PORTACATH PLACEMENT Right 03/07/2018   Procedure: INSERTION PORT-A-CATH RIGHT SUBCLAVIAN;  Surgeon: [name], MD;  Location: [place];  Service: General;  Laterality: Right;  . PROCTOSCOPY N/A 06/12/2018   Procedure: RIGID PROCTOSCOPY;  Surgeon: [name], MD;  Location: [facility];  Service: General;  Laterality: N/A;  . thumb surgery   2018   cyst removal    I have reviewed the social history and family history with the patient and they are unchanged from previous note.  ALLERGIES:  has No Known Allergies.  MEDICATIONS:  Current Outpatient Medications  Medication Sig Dispense Refill  . apixaban (ELIQUIS) 5 MG TABS tablet Take 1 tablet (5 mg total) by mouth 2 (two) times daily. 60 tablet 0  . b complex vitamins tablet Take 1 tablet by mouth daily.    . capecitabine (XELODA) 500 MG tablet Take 4 tablets (2000 mg total) by mouth every 12 hours. Take within 30 minutes of AM and PM meals. Take for 14 days on, then 7 days off. Repeat every 21 days. 112 tablet 1  . diltiazem (CARDIZEM CD) 360 MG 24 hr capsule Take 1 capsule (360 mg total) by mouth  daily. 90 capsule 3  . diphenoxylate-atropine (LOMOTIL) 2.5-0.025 MG tablet Take 1-2 tablets by mouth 4 (four) times daily as needed for diarrhea or loose stools. 60 tablet 1  . docusate sodium (COLACE) 100 MG capsule Take 100 mg by mouth 2 (two) times daily.    [name] levothyroxine (SYNTHROID) 75 MCG tablet Take 1 tablet (75 mcg total) by mouth daily. 90 tablet 1  . Multiple Vitamins-Iron (MULTIVITAMIN/IRON PO) Take 1 tablet by  mouth daily.     . prochlorperazine (COMPAZINE) 10 MG tablet Take 1 tablet (10 mg total) by mouth every 6 (six) hours as needed for nausea or vomiting. 30 tablet 0  . tamsulosin (FLOMAX) 0.4 MG CAPS capsule Take 0.4 mg by mouth 2 (two) times daily.      No current facility-administered medications for this visit.    PHYSICAL EXAMINATION: ECOG PERFORMANCE STATUS: 0 - Asymptomatic  Vitals:   06/02/20 0843  BP: 130/74  Pulse: 92  Resp: 18  Temp: 98.2 F (36.8 C)  SpO2: 98%   Filed Weights   06/02/20 0843  Weight: 234 lb 3.2 oz (106.2 kg)    GENERAL:alert, no distress and comfortable SKIN: No rash.  Palms without erythema EYES: sclera clear OROPHARYNX: No thrush or ulcers LUNGS: clear with normal breathing effort HEART: regular rate & rhythm, no lower extremity edema ABDOMEN:abdomen soft, non-tender and normal bowel sounds NEURO: alert & oriented x 3 with fluent speech, no focal motor/sensory deficits PAC without erythema  LABORATORY DATA:  I have reviewed the data as listed CBC Latest Ref Rng & Units 06/02/2020 05/14/2020 04/28/2020  WBC 4.0 - 10.5 K/uL 3.6(L) 4.0 4.5  Hemoglobin 13.0 - 17.0 g/dL 12.8(L) 12.1(L) 13.0  Hematocrit 39.0 - 52.0 % 38.4(L) 36.2(L) 38.9(L)  Platelets 150 - 400 K/uL 207 208 207     CMP Latest Ref Rng & Units 06/02/2020 05/14/2020 04/28/2020  Glucose 70 - 99 mg/dL 138(H) 109(H) 132(H)  BUN 8 - 23 mg/dL 22 23 20   Creatinine 0.61 - 1.24 mg/dL 1.40(H) 1.29(H) 1.37(H)  Sodium 135 - 145 mmol/L 140 140 141  Potassium 3.5 - 5.1 mmol/L 3.7 4.0 3.8  Chloride 98 - 111 mmol/L 103 107 108  CO2 22 - 32 mmol/L 28 24 25   Calcium 8.9 - 10.3 mg/dL 9.1 9.0 9.0  Total Protein 6.5 - 8.1 g/dL 6.8 6.6 6.8  Total Bilirubin 0.3 - 1.2 mg/dL 0.8 0.6 0.5  Alkaline Phos 38 - 126 U/L 70 81 86  AST 15 - 41 U/L 25 19 16   ALT 0 - 44 U/L 23 17 18       RADIOGRAPHIC STUDIES: I have personally reviewed the radiological images as listed and agreed with the findings in  the report. No results found.   ASSESSMENT & PLAN: Dave Runco Bennettis a 74y.o.malewith    1. Cancer of left colon,adenocarcinoma, stage IIIB(pT3N1cM0), MSI-stable, KRAS+ -Diagnosed in 01/2018. Treated with surgery andadjuvantFOLFOX. Due to side effects Oxaliplatin was stopped after 3 cycles and chemo was stopped after 6 cycles. He declined completing 6 months of chemo treatment. -He underwent hernia repair in 02/2019. He recovered well.  -SurveillanceCT CAP from4/28/21shows no definitive evidence of metastasis, except a soft tissue density in between prostate and rectum, indeterminate.  -colonoscopy in December 2019 showed mucosal inflammation in the rectum, biopsy was negative. -Rising CEA -Repeat colonoscopy on 11/02/2019 by Dr. [name] Plan showed normal digital rectal exam, 3 polyps in the rectum, descending colon and cecum, and a prior sigmoid: Anastomosis characterized by erythema.He found an  extrinsic nonobstructing medium-sized mass in the proximal rectum about 4 cm in length,no internal rectal mass. -He underwent pelvic MRI on 12/06/2019 which was reviewed in our tumor board, showing a masslike area in the rectum suspicious for rectal neoplasm, likely T4b based on the appearance of soft tissue extending along the anterior peritoneal reflection and into the seminal vesicles.  -His PET from 12/21/2019 shows peritoneal metastasis, 2 hypermetabolic hepatic metastases, and local recurrence within the left abdominal wallwhich was palpable on exam 01/28/20.His ultrasound biopsy of the left abdominal wall from 01/15/2020 shows metastatic colorectal adenocarcinoma. -It was discussed that this is recurrent metastatic stage IV colon cancer, not resectable but still treatable. Given his good nutrition and PS he will likely tolerate treatment well. The goal is palliative -Began first line dose-reducedFOLFIRIon8/23/2021 -FoundationOneshowed Kras (+)positive, he is not a candidate for EGFR  inhibitor. Bevacizumab was added with cycle 2 -S/p 8 cycles of FOLFIRI and bevacizumab from cycle 2; tolerated well with fatigue, nausea, diarrhea. CEA normalized after cycle 4 -CT AP 05/05/20 showed marked improvement in liver and peritoneal metastases.  Plan is to proceed with maintenance bevacizumab 7.5 mg/kg and Xeloda 2000 mg BID days 1-14 q21 days, starting 06/02/2020   2. Hypothyroidism -Previously on synthroid, TSH normal 2 years ago. He has not been taking synthroid for a while -TSH in 02/2020 up to 67, urgent message sent to PCP for management  3.BPH  -Followed by urologist Dr. Lovena Neighbours -Pelvic MRI on 7/1 shows marked prostatomegaly with signs of BPH extending into the bladder base.  -Denies new or worsening urinary symptoms  4. Atrial Fibrillation -continue Eliquis. Rate controlled.He can stop Eliquis periodically for bleeding hemorrhoids -Continue follow-up with cardiology  5.CKD stage III -he has developed mild increased Cr since he started chemo, EGFR around 40-50's -I encouraged him to control his blood pressure, cholesterol and BG. I also encouraged him to drink plenty of water. -Will avoid NSAIDs and CT IV contrast if GFR low  Disposition: Mr. Leeds appears stable.  He has recovered well from last cycle of FOLFIRI/bevacizumab.  Labs reviewed, we will follow-up on the pending CEA from today.  He will proceed with cycle 1 maintenance Xeloda 2000 mg twice daily for 2 weeks on/1 week off and bevacizumab 7.5 mg/kg.  We again reviewed potential side effects and symptom management.  He will return for lab and follow-up in 3 weeks before cycle 2.  All questions were answered. The patient knows to call the clinic with any problems, questions or concerns. No barriers to learning were detected.     Alla Feeling, NP 06/02/20

## 2020-06-02 ENCOUNTER — Inpatient Hospital Stay: Payer: Commercial Managed Care - PPO

## 2020-06-02 ENCOUNTER — Inpatient Hospital Stay (HOSPITAL_BASED_OUTPATIENT_CLINIC_OR_DEPARTMENT_OTHER): Payer: Commercial Managed Care - PPO | Admitting: Nurse Practitioner

## 2020-06-02 ENCOUNTER — Encounter: Payer: Self-pay | Admitting: Nurse Practitioner

## 2020-06-02 ENCOUNTER — Other Ambulatory Visit: Payer: Self-pay

## 2020-06-02 VITALS — BP 130/74 | HR 92 | Temp 98.2°F | Resp 18 | Ht 71.0 in | Wt 234.2 lb

## 2020-06-02 VITALS — BP 167/68

## 2020-06-02 DIAGNOSIS — C186 Malignant neoplasm of descending colon: Secondary | ICD-10-CM

## 2020-06-02 DIAGNOSIS — Z5112 Encounter for antineoplastic immunotherapy: Secondary | ICD-10-CM | POA: Diagnosis not present

## 2020-06-02 LAB — CBC WITH DIFFERENTIAL (CANCER CENTER ONLY)
Abs Immature Granulocytes: 0.02 10*3/uL (ref 0.00–0.07)
Basophils Absolute: 0 10*3/uL (ref 0.0–0.1)
Basophils Relative: 1 %
Eosinophils Absolute: 0.2 10*3/uL (ref 0.0–0.5)
Eosinophils Relative: 5 %
HCT: 38.4 % — ABNORMAL LOW (ref 39.0–52.0)
Hemoglobin: 12.8 g/dL — ABNORMAL LOW (ref 13.0–17.0)
Immature Granulocytes: 1 %
Lymphocytes Relative: 36 %
Lymphs Abs: 1.3 10*3/uL (ref 0.7–4.0)
MCH: 31.8 pg (ref 26.0–34.0)
MCHC: 33.3 g/dL (ref 30.0–36.0)
MCV: 95.3 fL (ref 80.0–100.0)
Monocytes Absolute: 0.4 10*3/uL (ref 0.1–1.0)
Monocytes Relative: 12 %
Neutro Abs: 1.7 10*3/uL (ref 1.7–7.7)
Neutrophils Relative %: 45 %
Platelet Count: 207 10*3/uL (ref 150–400)
RBC: 4.03 MIL/uL — ABNORMAL LOW (ref 4.22–5.81)
RDW: 15.4 % (ref 11.5–15.5)
WBC Count: 3.6 10*3/uL — ABNORMAL LOW (ref 4.0–10.5)
nRBC: 0 % (ref 0.0–0.2)

## 2020-06-02 LAB — CMP (CANCER CENTER ONLY)
ALT: 23 U/L (ref 0–44)
AST: 25 U/L (ref 15–41)
Albumin: 3.8 g/dL (ref 3.5–5.0)
Alkaline Phosphatase: 70 U/L (ref 38–126)
Anion gap: 9 (ref 5–15)
BUN: 22 mg/dL (ref 8–23)
CO2: 28 mmol/L (ref 22–32)
Calcium: 9.1 mg/dL (ref 8.9–10.3)
Chloride: 103 mmol/L (ref 98–111)
Creatinine: 1.4 mg/dL — ABNORMAL HIGH (ref 0.61–1.24)
GFR, Estimated: 53 mL/min — ABNORMAL LOW (ref >60)
Glucose, Bld: 138 mg/dL — ABNORMAL HIGH (ref 70–99)
Potassium: 3.7 mmol/L (ref 3.5–5.1)
Sodium: 140 mmol/L (ref 135–145)
Total Bilirubin: 0.8 mg/dL (ref 0.3–1.2)
Total Protein: 6.8 g/dL (ref 6.5–8.1)

## 2020-06-02 LAB — TOTAL PROTEIN, URINE DIPSTICK: Protein, ur: 30 mg/dL — AB

## 2020-06-02 LAB — CEA (IN HOUSE-CHCC): CEA (CHCC-In House): 3.61 ng/mL (ref 0.00–5.00)

## 2020-06-02 MED ORDER — SODIUM CHLORIDE 0.9% FLUSH
10.0000 mL | INTRAVENOUS | Status: DC | PRN
  Administered 2020-06-02: 10 mL
  Filled 2020-06-02: qty 10

## 2020-06-02 MED ORDER — SODIUM CHLORIDE 0.9 % IV SOLN
7.5000 mg/kg | Freq: Once | INTRAVENOUS | Status: AC
  Administered 2020-06-02: 800 mg via INTRAVENOUS
  Filled 2020-06-02: qty 32

## 2020-06-02 MED ORDER — HEPARIN SOD (PORK) LOCK FLUSH 100 UNIT/ML IV SOLN
500.0000 [IU] | Freq: Once | INTRAVENOUS | Status: AC | PRN
  Administered 2020-06-02: 500 [IU]
  Filled 2020-06-02: qty 5

## 2020-06-02 MED ORDER — SODIUM CHLORIDE 0.9 % IV SOLN: 7.5000 mg/kg | Freq: Once | INTRAVENOUS | Status: DC

## 2020-06-02 MED ORDER — SODIUM CHLORIDE 0.9 % IV SOLN
Freq: Once | INTRAVENOUS | Status: AC
  Filled 2020-06-02: qty 250

## 2020-06-02 NOTE — Patient Instructions (Signed)
[facility] Discharge Instructions for Patients Receiving Chemotherapy  Today you received the following chemotherapy agents: bevacizumab  To help prevent nausea and vomiting after your treatment, we encourage you to take your nausea medication as directed.   If you develop nausea and vomiting that is not controlled by your nausea medication, call the clinic.   BELOW ARE SYMPTOMS THAT SHOULD BE REPORTED IMMEDIATELY:  *FEVER GREATER THAN 100.5 F  *CHILLS WITH OR WITHOUT FEVER  NAUSEA AND VOMITING THAT IS NOT CONTROLLED WITH YOUR NAUSEA MEDICATION  *UNUSUAL SHORTNESS OF BREATH  *UNUSUAL BRUISING OR BLEEDING  TENDERNESS IN MOUTH AND THROAT WITH OR WITHOUT PRESENCE OF ULCERS  *URINARY PROBLEMS  *BOWEL PROBLEMS  UNUSUAL RASH Items with * indicate a potential emergency and should be followed up as soon as possible.  Feel free to call the clinic should you have any questions or concerns. The clinic phone number is [phone].  Please show the CHEMO ALERT CARD at check-in to the Emergency Department and triage nurse.   

## 2020-06-02 NOTE — Progress Notes (Signed)
The following biosimilar Mvasi (bevacizumab-awwb) has been selected for use in this patient.  Mvasi pref per PA team.  Ebony Hail, Pharm.D., CPP 06/02/2020@10 :05 AM

## 2020-06-03 ENCOUNTER — Telehealth: Payer: Self-pay | Admitting: Nurse Practitioner

## 2020-06-03 NOTE — Telephone Encounter (Signed)
Scheduled appointments per 12/27 los. Called patient, no answer. Left message with appointments date and times.  

## 2020-06-16 ENCOUNTER — Ambulatory Visit: Payer: Commercial Managed Care - PPO | Admitting: Hematology

## 2020-06-16 ENCOUNTER — Other Ambulatory Visit: Payer: Commercial Managed Care - PPO

## 2020-06-16 ENCOUNTER — Other Ambulatory Visit: Payer: Self-pay | Admitting: Oncology

## 2020-06-16 ENCOUNTER — Ambulatory Visit: Payer: Commercial Managed Care - PPO

## 2020-06-18 NOTE — Progress Notes (Incomplete)
[place]   [phone] [phone]   Clinic Follow up Note   Patient Care Team: [name], MD as PCP - General (Family Medicine) [name], MD as Consulting Physician (Cardiology) [name], MD as Consulting Physician (Gastroenterology) [name], MD as Consulting Physician (General Surgery) [name], MD as Consulting Physician (General Surgery) [name], MD as Consulting Physician (Urology) [name], MD as Consulting Physician (Medical Oncology)  Date of Service:  06/18/2020  CHIEF COMPLAINT: F/u on colon cancer  SUMMARY OF ONCOLOGIC HISTORY: Oncology History Overview Note  Cancer Staging Cancer of left colon [facility]) Staging form: Colon and Rectum, AJCC 8th Edition - Pathologic stage from 01/11/2018: Stage IIIB (pT3, pN1c, cM0) - Signed by [name], MD on 01/16/2018     Cancer of left colon ([place])  01/10/2018 Imaging   CT AP W Contrast 01/10/18  IMPRESSION: Irregular soft tissue density causing stricture of the mid descending colon likely the site of obstruction for the dilated small bowel. 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 No evidence of perforation.  Equivocal findings involving the appendix measuring 1.2 cm at the appendiceal tip with mucosal enhancement. No adjacent free fluid or inflammatory change. Findings are nonspecific, but can be seen with early acute appendicitis.  Mild prostatic enlargement. Increased density over the posterior bladder base likely due to the large prostatic impression although cannot completely exclude a bladder mass. Urology protocol CT or ultrasound may be helpful for better evaluation.  Mild cholelithiasis.  Stable 1.5 cm cystic structure over the lower pole right kidney likely slightly hyperdense cyst.  Diverticulosis of the colon.  Aortic Atherosclerosis (ICD10-I70.0).     01/11/2018 Cancer Staging   Staging form: Colon and Rectum, AJCC  8th Edition - Pathologic stage from 01/11/2018: Stage IIIB (pT3, pN1c, cM0) - Signed by [name], MD on 01/16/2018   01/11/2018 Surgery   LEFT COLON RESECTION, TAKEDOWN SPLENIC FLEXURE, COLOSTOMY by Dr. [name]    01/11/2018 Procedure   Colonoscopy 01/11/18 by Dr. [name]  - Malignant completely obstructing tumor in the mid descending colon. Tattooed. - Diverticulosis in the sigmoid colon. - Non-bleeding internal hemorrhoids. - No specimens collected.   01/11/2018 Pathology Results   Diagnosis 01/11/18  1. Colon, segmental resection for tumor, descending colon - INVASIVE COLORECTAL ADENOCARCINOMA, 4 CM. - TUMOR EXTENDS INTO PERICOLONIC CONNECTIVE TISSUE. - TUMOR FOCALLY INVOLVES RADIAL MARGIN. - ONE MESENTERIC TUMOR DEPOSIT. - THIRTEEN BENIGN LYMPH NODES (0/13). 2. Colon, segmental resection, splenic flexure - BENIGN COLON. - NO EVIDENCE OF MALIGNANCY .   01/11/2018 Tumor Marker   Baseline CEA at 3.4   01/16/2018 Initial Diagnosis   Cancer of left colon ([place])   01/23/2018 Imaging   CT CHEST WO CONTRAST IMPRESSION: 1. No evidence for metastatic disease within the chest. 2. Small left pleural effusion with underlying opacities which may represent atelectasis. Right basilar atelectasis. 3. Few foci of gas within the upper abdomen in the omentum with surrounding fat stranding, likely postsurgical 4. Aortic Atherosclerosis (ICD10-I70.0).   03/08/2018 - 05/15/2018 Chemotherapy   adjuvant FOLOFX. Due to side effects of neuropahty Oxaliplatin was stopped after 3 cycles and chemo was stopped after 6 cycles. He declined completing 6 months of chemo treatment.     03/19/2018 Imaging   03/19/2018 CT AP IMPRESSION: 1. Interval partial left hemicolectomy and descending colostomy. 2. Heterogeneous soft tissue density along the left anterior renal fascia is most likely postoperative (favor fat necrosis). No well-defined fluid collection. 3. Mild left lower quadrant  edema, new since 01/10/2018. This  could be postoperative. Superimposed sigmoid diverticulitis and/or cystitis cannot be excluded. 4. Subtle hyperenhancing nodule within the anterior bladder dome cannot be excluded. Consider nonemergent cystoscopy. When this is performed, recommend attention to the left ureterovesicular junction and distal left ureter to evaluate questionable soft tissue fullness. 5. Cholelithiasis. 6.  Aortic Atherosclerosis (ICD10-I70.0). 7. Prostatomegaly.   11/13/2018 Imaging   CT CAP WO Contrast 11/13/18  IMPRESSION: 1. Reversal of left lower quadrant colostomy with sigmoid colon anastomosis. No complicating features. No findings for residual or recurrent tumor or metastatic disease involving the chest, abdomen or pelvis without contrast. 2. No acute abdominal/pelvic findings. 3. Gallbladder sludge and gallstones but no findings for acute cholecystitis. 4. Stable anterior abdominal wall hernia. 5. The right testicle is in the right inguinal canal.   10/03/2019 Imaging   CT CAP WO contrast  IMPRESSION: 1. New rounded density interposed between the prostate gland and anterior upper rectal wall could represent adenopathy or local extension of anterior rectal tumor. 2. Marked prostatomegaly, prostate volume 150 cubic cm. 3. Other imaging findings of potential clinical significance: Aortic Atherosclerosis (ICD10-I70.0). Coronary atherosclerosis. Trace right pleural effusion. Suspected cholelithiasis. Nonobstructive left nephrolithiasis. Multilevel lumbar impingement. Bilateral mildly retracted testicles. Hypodense exophytic lesion of the right kidney, most likely to be a cyst.   11/02/2019 Procedure   colonoscopy on 11/02/2019 by Dr. [name] Plan showed normal digital rectal exam, 3 polyps in the rectum, descending colon and cecum, and a prior sigmoid: Anastomosis characterized by erythema.  He found an extrinsic nonobstructing medium-sized mass in the proximal rectum about 4 cm in length, no internal  rectal mass.   Diagnosis Surgical [P], colon, cecum, descending, rectal, polyp (3) - TUBULAR ADENOMA (TWO) - NO HIGH GRADE DYSPLASIA OR CARCINOMA. - COLONIC FRAGMENT WITH BENIGN LYMPHOID AGGREGATE.   12/07/2019 Imaging   MRI pelvis IMPRESSION: 1. Masslike area in the rectum suspicious for rectal neoplasm, likely T4b based on the appearance of soft tissue extending along the anterior peritoneal reflection and into the seminal vesicles. Correlation with recent colonoscopy results may be helpful. Area of anastomosis and other areas of the pelvis are not imaged on today's exam.   12/21/2019 PET scan   IMPRESSION: 1. Unfortunately evidence for peritoneal metastasis. Intensely hypermetabolic nodules along the LEFT pericolic gutter. Favor hypermetabolic mass anterior to the rectum to represent serosal implant along the ventral surface of the rectum. 2. local recurrence within the LEFT abdominal wall at site prior colostomy. Intense hypermetabolic thickening of the rectus muscle at this site. 3. Two hypermetabolic hepatic metastasis.   01/15/2020 Relapse/Recurrence   FINAL MICROSCOPIC DIAGNOSIS:   A. SOFT TISSUE, LEFT ABDOMINAL WALL, BIOPSY:  - Adenocarcinoma.  - See comment.   COMMENT:   The morphology is consistent with metastatic colorectal adenocarcinoma.    01/28/2020 -  Chemotherapy   First line FOLFIRI q2weeks starting 01/28/20 for 8 cycles.  -----Bevacizumab added with C2.  -----Changed to maintenance Xeloda 2000 mg twice daily for 2 weeks on/1 week off and bevacizumab 06/02/20     05/05/2020 Imaging   IMPRESSION: 1. Improved appearance, with reduced size of the hepatic metastatic lesions and reduced size of the peritoneal tumor implants. 2. Other imaging findings of potential clinical significance: Notable prostatomegaly. Multilevel impingement in the lumbar spine. Dependent density in the gallbladder possibly from sludge or gallstones. Degenerative glenohumeral  arthropathy bilaterally. 3. Aortic atherosclerosis.      CURRENT THERAPY:  First line FOLFIRI q2weeks starting 01/28/20 for 8 cycles.Bevacizumabadded with C2.Changed to  maintenance Xeloda 2000 mg twice daily for 2 weeks on/1 week off and bevacizumab 06/02/20   INTERVAL HISTORY: *** Dave Vaughn is here for a follow up. He presents to the clinic alone.    REVIEW OF SYSTEMS:  *** Constitutional: Denies fevers, chills or abnormal weight loss Eyes: Denies blurriness of vision Ears, nose, mouth, throat, and face: Denies mucositis or sore throat Respiratory: Denies cough, dyspnea or wheezes Cardiovascular: Denies palpitation, chest discomfort or lower extremity swelling Gastrointestinal:  Denies nausea, heartburn or change in bowel habits Skin: Denies abnormal skin rashes Lymphatics: Denies new lymphadenopathy or easy bruising Neurological:Denies numbness, tingling or new weaknesses Behavioral/Psych: Mood is stable, no new changes  All other systems were reviewed with the patient and are negative.  MEDICAL HISTORY:  Past Medical History:  Diagnosis Date  . Adenocarcinoma, colon ([place]) dx'd 01/2018  . Anemia    taking iron supplements  . Anxiety   . Atrial fibrillation with RVR ([place])   . Colonic obstruction ([place]) 01/10/2018  . Depression   . Diverticulitis   . Dysrhythmia    afib  . History of kidney stones   . Hypertension     SURGICAL HISTORY: Past Surgical History:  Procedure Laterality Date  . ANKLE SURGERY Left    when he was in college  . COLON RESECTION N/A 01/11/2018   Procedure: LEFT COLON RESECTION, TAKEDOWN SPLENIC FLEXURE, COLOSTOMY;  Surgeon: [name], MD;  Location: [facility];  Service: General;  Laterality: N/A;  . COLONOSCOPY  01/11/2018   Procedure: COLONOSCOPY;  Surgeon: Jackquline Denmark, MD;  Location: [facility];  Service: Endoscopy;;  . COLONOSCOPY  05/10/2018  . colonscopy  05/10/2018  . HERNIA REPAIR    . HERNIA REPAIR  03/02/2019   EXPLORATORY  LAPAROTOMY (N/A Abdomen)  . INCISIONAL HERNIA REPAIR N/A 03/02/2019   Procedure: INCISIONAL HERNIA REPAIR , RECTORECTUS VS TAR HERNIA REPAIR;  Surgeon: Ralene Ok, MD;  Location: [place];  Service: General;  Laterality: N/A;  . INSERTION OF MESH N/A 03/02/2019   Procedure: Insertion Of Mesh;  Surgeon: Ralene Ok, MD;  Location: [place];  Service: General;  Laterality: N/A;  . LAPAROTOMY N/A 03/02/2019   Procedure: EXPLORATORY LAPAROTOMY;  Surgeon: Ralene Ok, MD;  Location: [place];  Service: General;  Laterality: N/A;  . LYSIS OF ADHESION N/A 06/12/2018   Procedure: LYSIS OF ADHESIONS;  Surgeon: [name], MD;  Location: [facility];  Service: General;  Laterality: N/A;  . LYSIS OF ADHESION N/A 03/02/2019   Procedure: Lysis Of Adhesion;  Surgeon: Ralene Ok, MD;  Location: [place];  Service: General;  Laterality: N/A;  . PORTACATH PLACEMENT Right 03/07/2018   Procedure: INSERTION PORT-A-CATH RIGHT SUBCLAVIAN;  Surgeon: [name], MD;  Location: [place];  Service: General;  Laterality: Right;  . PROCTOSCOPY N/A 06/12/2018   Procedure: RIGID PROCTOSCOPY;  Surgeon: [name], MD;  Location: [facility];  Service: General;  Laterality: N/A;  . thumb surgery   2018   cyst removal    I have reviewed the social history and family history with the patient and they are unchanged from previous note.  ALLERGIES:  has No Known Allergies.  MEDICATIONS:  Current Outpatient Medications  Medication Sig Dispense Refill  . apixaban (ELIQUIS) 5 MG TABS tablet Take 1 tablet (5 mg total) by mouth 2 (two) times daily. 60 tablet 0  . b complex vitamins tablet Take 1 tablet by mouth daily.    . capecitabine (XELODA) 500 MG tablet Take 4 tablets (2000  mg total) by mouth every 12 hours. Take within 30 minutes of AM and PM meals. Take for 14 days on, then 7 days off. Repeat every 21 days. 112 tablet 1  . diltiazem (CARDIZEM CD) 360 MG 24 hr capsule Take 1 capsule (360 mg total) by mouth daily. 90 capsule  3  . diphenoxylate-atropine (LOMOTIL) 2.5-0.025 MG tablet Take 1-2 tablets by mouth 4 (four) times daily as needed for diarrhea or loose stools. 60 tablet 1  . docusate sodium (COLACE) 100 MG capsule Take 100 mg by mouth 2 (two) times daily.    [name] levothyroxine (SYNTHROID) 75 MCG tablet Take 1 tablet (75 mcg total) by mouth daily. 90 tablet 1  . Multiple Vitamins-Iron (MULTIVITAMIN/IRON PO) Take 1 tablet by mouth daily.     . prochlorperazine (COMPAZINE) 10 MG tablet Take 1 tablet (10 mg total) by mouth every 6 (six) hours as needed for nausea or vomiting. 30 tablet 0  . tamsulosin (FLOMAX) 0.4 MG CAPS capsule Take 0.4 mg by mouth 2 (two) times daily.      No current facility-administered medications for this visit.    PHYSICAL EXAMINATION: ECOG PERFORMANCE STATUS: {CHL ONC ECOG [phone]}  There were no vitals filed for this visit. There were no vitals filed for this visit. *** GENERAL:alert, no distress and comfortable SKIN: skin color, texture, turgor are normal, no rashes or significant lesions EYES: normal, Conjunctiva are pink and non-injected, sclera clear {OROPHARYNX:no exudate, no erythema and lips, buccal mucosa, and tongue normal}  NECK: supple, thyroid normal size, non-tender, without nodularity LYMPH:  no palpable lymphadenopathy in the cervical, axillary {or inguinal} LUNGS: clear to auscultation and percussion with normal breathing effort HEART: regular rate & rhythm and no murmurs and no lower extremity edema ABDOMEN:abdomen soft, non-tender and normal bowel sounds Musculoskeletal:no cyanosis of digits and no clubbing  NEURO: alert & oriented x 3 with fluent speech, no focal motor/sensory deficits  LABORATORY DATA:  I have reviewed the data as listed CBC Latest Ref Rng & Units 06/02/2020 05/14/2020 04/28/2020  WBC 4.0 - 10.5 K/uL 3.6(L) 4.0 4.5  Hemoglobin 13.0 - 17.0 g/dL 12.8(L) 12.1(L) 13.0  Hematocrit 39.0 - 52.0 % 38.4(L) 36.2(L) 38.9(L)  Platelets 150 - 400  K/uL 207 208 207     CMP Latest Ref Rng & Units 06/02/2020 05/14/2020 04/28/2020  Glucose 70 - 99 mg/dL 138(H) 109(H) 132(H)  BUN 8 - 23 mg/dL 22 23 20   Creatinine 0.61 - 1.24 mg/dL 1.40(H) 1.29(H) 1.37(H)  Sodium 135 - 145 mmol/L 140 140 141  Potassium 3.5 - 5.1 mmol/L 3.7 4.0 3.8  Chloride 98 - 111 mmol/L 103 107 108  CO2 22 - 32 mmol/L 28 24 25   Calcium 8.9 - 10.3 mg/dL 9.1 9.0 9.0  Total Protein 6.5 - 8.1 g/dL 6.8 6.6 6.8  Total Bilirubin 0.3 - 1.2 mg/dL 0.8 0.6 0.5  Alkaline Phos 38 - 126 U/L 70 81 86  AST 15 - 41 U/L 25 19 16   ALT 0 - 44 U/L 23 17 18       RADIOGRAPHIC STUDIES: I have personally reviewed the radiological images as listed and agreed with the findings in the report. No results found.   ASSESSMENT & PLAN:  Dave Vaughn is a 75 y.o. male with    1. Cancer of left colon,adenocarcinoma, stage IIIB(pT3N1cM0), MSI-stable, liver and peritoneal recurrence 12/2019 -Diagnosed in 01/2018. Treated with surgery andadjuvantFOLFOX. Due to side effects Oxaliplatin was stopped after 3 cycles and chemo was stopped after 6  cycles. He declined completing 6 months of chemo treatment. -Unfortunately he had local recurrence in left abdominal wall withperitoneal metastasis,Two hypermetabolic hepatic metastasisin 12/2019.HisUS Biopsyof abdominal wallfrom8/2021 confirmed metastaticcolorectal adenocarcinoma. -His 01/15/20 FO results showKras mutation, MSS, he is not a candidate for EGFR inhibitor or immunotherapy -I started him on first-line FOLFIRI q2weeks on 01/28/20.Bevacizumab (MVASI)was added with C2.He completed 8 cycles before switching to maintenance Xeloda 2052m BID 2 weeks on/1 week off and Bevacizumab on 06/02/20.  ***   2. Comorbidities: Atrial Fibrillation, CKD stage III, BPH, Elevated TSH  -continue Eliquis. Continue follow-up with cardiologyDr HLyla Son-Also continue to Followed with urologist Dr. WLovena Neighbours Pelvic MRI on 7/1 shows marked prostatomegaly  with signs of BPH extending into the bladder base -TSH on 02/15/20 at 91.79. He is on Synthroid since 02/16/20.Will continue to monitor.   3.Goal of care discussion  -The patient understands the goal of care is palliative. -He is full code now    Plan ***  No problem-specific Assessment & Plan notes found for this encounter.   No orders of the defined types were placed in this encounter.  All questions were answered. The patient knows to call the clinic with any problems, questions or concerns. No barriers to learning was detected. The total time spent in the appointment was {CHL ONC TIME VISIT - SHQPRF:[id number]}     AJoslyn Devon1/05/2021   IOneal Deputy am acting as scribe for YTruitt Merle MD.   {Add scribe attestation statement}

## 2020-06-23 ENCOUNTER — Ambulatory Visit: Payer: Commercial Managed Care - PPO

## 2020-06-23 ENCOUNTER — Inpatient Hospital Stay: Payer: Commercial Managed Care - PPO

## 2020-06-23 ENCOUNTER — Other Ambulatory Visit: Payer: Commercial Managed Care - PPO

## 2020-06-23 ENCOUNTER — Telehealth: Payer: Self-pay | Admitting: *Deleted

## 2020-06-23 ENCOUNTER — Encounter: Payer: Self-pay | Admitting: Hematology

## 2020-06-23 ENCOUNTER — Inpatient Hospital Stay: Payer: Commercial Managed Care - PPO | Admitting: Hematology

## 2020-06-23 ENCOUNTER — Ambulatory Visit: Payer: Commercial Managed Care - PPO | Admitting: Hematology

## 2020-06-23 ENCOUNTER — Encounter: Payer: Self-pay | Admitting: Nurse Practitioner

## 2020-06-23 DIAGNOSIS — C186 Malignant neoplasm of descending colon: Secondary | ICD-10-CM

## 2020-06-23 NOTE — Telephone Encounter (Signed)
Spoke with patient. We will get appts rescheduled. Message to scheduler.  Dr Burr Medico states he can start Xeloda today if he feels [place]. He states he feels great and took Xeloda this morning

## 2020-06-27 ENCOUNTER — Inpatient Hospital Stay (HOSPITAL_BASED_OUTPATIENT_CLINIC_OR_DEPARTMENT_OTHER): Payer: Commercial Managed Care - PPO | Admitting: Physician Assistant

## 2020-06-27 ENCOUNTER — Other Ambulatory Visit: Payer: Self-pay

## 2020-06-27 ENCOUNTER — Inpatient Hospital Stay: Payer: Commercial Managed Care - PPO | Attending: Nurse Practitioner

## 2020-06-27 ENCOUNTER — Inpatient Hospital Stay: Payer: Commercial Managed Care - PPO

## 2020-06-27 VITALS — BP 124/78 | HR 78 | Temp 98.6°F | Resp 17 | Ht 71.0 in | Wt 237.6 lb

## 2020-06-27 DIAGNOSIS — C186 Malignant neoplasm of descending colon: Secondary | ICD-10-CM

## 2020-06-27 DIAGNOSIS — E039 Hypothyroidism, unspecified: Secondary | ICD-10-CM | POA: Insufficient documentation

## 2020-06-27 DIAGNOSIS — Z7901 Long term (current) use of anticoagulants: Secondary | ICD-10-CM | POA: Insufficient documentation

## 2020-06-27 DIAGNOSIS — N183 Chronic kidney disease, stage 3 unspecified: Secondary | ICD-10-CM | POA: Insufficient documentation

## 2020-06-27 DIAGNOSIS — C787 Secondary malignant neoplasm of liver and intrahepatic bile duct: Secondary | ICD-10-CM | POA: Insufficient documentation

## 2020-06-27 DIAGNOSIS — N400 Enlarged prostate without lower urinary tract symptoms: Secondary | ICD-10-CM | POA: Diagnosis not present

## 2020-06-27 DIAGNOSIS — I4891 Unspecified atrial fibrillation: Secondary | ICD-10-CM | POA: Diagnosis not present

## 2020-06-27 DIAGNOSIS — Z5112 Encounter for antineoplastic immunotherapy: Secondary | ICD-10-CM | POA: Diagnosis present

## 2020-06-27 DIAGNOSIS — Z95828 Presence of other vascular implants and grafts: Secondary | ICD-10-CM

## 2020-06-27 DIAGNOSIS — C786 Secondary malignant neoplasm of retroperitoneum and peritoneum: Secondary | ICD-10-CM | POA: Diagnosis not present

## 2020-06-27 LAB — CBC WITH DIFFERENTIAL (CANCER CENTER ONLY)
Abs Immature Granulocytes: 0 10*3/uL (ref 0.00–0.07)
Basophils Absolute: 0 10*3/uL (ref 0.0–0.1)
Basophils Relative: 1 %
Eosinophils Absolute: 0.2 10*3/uL (ref 0.0–0.5)
Eosinophils Relative: 3 %
HCT: 38.2 % — ABNORMAL LOW (ref 39.0–52.0)
Hemoglobin: 12.8 g/dL — ABNORMAL LOW (ref 13.0–17.0)
Immature Granulocytes: 0 %
Lymphocytes Relative: 36 %
Lymphs Abs: 1.7 10*3/uL (ref 0.7–4.0)
MCH: 33 pg (ref 26.0–34.0)
MCHC: 33.5 g/dL (ref 30.0–36.0)
MCV: 98.5 fL (ref 80.0–100.0)
Monocytes Absolute: 0.4 10*3/uL (ref 0.1–1.0)
Monocytes Relative: 9 %
Neutro Abs: 2.5 10*3/uL (ref 1.7–7.7)
Neutrophils Relative %: 51 %
Platelet Count: 159 10*3/uL (ref 150–400)
RBC: 3.88 MIL/uL — ABNORMAL LOW (ref 4.22–5.81)
RDW: 15.8 % — ABNORMAL HIGH (ref 11.5–15.5)
WBC Count: 4.8 10*3/uL (ref 4.0–10.5)
nRBC: 0 % (ref 0.0–0.2)

## 2020-06-27 LAB — CMP (CANCER CENTER ONLY)
ALT: 15 U/L (ref 0–44)
AST: 22 U/L (ref 15–41)
Albumin: 3.9 g/dL (ref 3.5–5.0)
Alkaline Phosphatase: 70 U/L (ref 38–126)
Anion gap: 9 (ref 5–15)
BUN: 24 mg/dL — ABNORMAL HIGH (ref 8–23)
CO2: 25 mmol/L (ref 22–32)
Calcium: 8.7 mg/dL — ABNORMAL LOW (ref 8.9–10.3)
Chloride: 108 mmol/L (ref 98–111)
Creatinine: 1.17 mg/dL (ref 0.61–1.24)
GFR, Estimated: >60 mL/min (ref >60)
Glucose, Bld: 110 mg/dL — ABNORMAL HIGH (ref 70–99)
Potassium: 4 mmol/L (ref 3.5–5.1)
Sodium: 142 mmol/L (ref 135–145)
Total Bilirubin: 0.9 mg/dL (ref 0.3–1.2)
Total Protein: 6.8 g/dL (ref 6.5–8.1)

## 2020-06-27 LAB — TOTAL PROTEIN, URINE DIPSTICK: Protein, ur: 30 mg/dL — AB

## 2020-06-27 MED ORDER — SODIUM CHLORIDE 0.9% FLUSH
10.0000 mL | INTRAVENOUS | Status: DC | PRN
  Administered 2020-06-27: 10 mL
  Filled 2020-06-27: qty 10

## 2020-06-27 MED ORDER — HEPARIN SOD (PORK) LOCK FLUSH 100 UNIT/ML IV SOLN
500.0000 [IU] | Freq: Once | INTRAVENOUS | Status: AC | PRN
  Administered 2020-06-27: 500 [IU]
  Filled 2020-06-27: qty 5

## 2020-06-27 NOTE — Progress Notes (Signed)
[facility] OFFICE PROGRESS NOTE  [name], MD [place] [address]  DIAGNOSIS: Follow up colon cancer   Oncology History Overview Note  Cancer Staging Cancer of left colon [facility]) Staging form: Colon and Rectum, AJCC 8th Edition - Pathologic stage from 01/11/2018: Stage IIIB (pT3, pN1c, cM0) - Signed by [name], MD on 01/16/2018     Cancer of left colon ([place])  01/10/2018 Imaging   CT AP W Contrast 01/10/18  IMPRESSION: Irregular soft tissue density causing stricture of the mid descending colon likely the site of obstruction for the dilated small bowel. 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 No evidence of perforation.  Equivocal findings involving the appendix measuring 1.2 cm at the appendiceal tip with mucosal enhancement. No adjacent free fluid or inflammatory change. Findings are nonspecific, but can be seen with early acute appendicitis.  Mild prostatic enlargement. Increased density over the posterior bladder base likely due to the large prostatic impression although cannot completely exclude a bladder mass. Urology protocol CT or ultrasound may be helpful for better evaluation.  Mild cholelithiasis.  Stable 1.5 cm cystic structure over the lower pole right kidney likely slightly hyperdense cyst.  Diverticulosis of the colon.  Aortic Atherosclerosis (ICD10-I70.0).     01/11/2018 Cancer Staging   Staging form: Colon and Rectum, AJCC 8th Edition - Pathologic stage from 01/11/2018: Stage IIIB (pT3, pN1c, cM0) - Signed by [name], MD on 01/16/2018   01/11/2018 Surgery   LEFT COLON RESECTION, TAKEDOWN SPLENIC FLEXURE, COLOSTOMY by Dr. [name]    01/11/2018 Procedure   Colonoscopy 01/11/18 by Dr. [name]  - Malignant completely obstructing tumor in the mid descending colon. Tattooed. - Diverticulosis in the sigmoid colon. - Non-bleeding internal hemorrhoids. - No specimens collected.   01/11/2018 Pathology Results    Diagnosis 01/11/18  1. Colon, segmental resection for tumor, descending colon - INVASIVE COLORECTAL ADENOCARCINOMA, 4 CM. - TUMOR EXTENDS INTO PERICOLONIC CONNECTIVE TISSUE. - TUMOR FOCALLY INVOLVES RADIAL MARGIN. - ONE MESENTERIC TUMOR DEPOSIT. - THIRTEEN BENIGN LYMPH NODES (0/13). 2. Colon, segmental resection, splenic flexure - BENIGN COLON. - NO EVIDENCE OF MALIGNANCY .   01/11/2018 Tumor Marker   Baseline CEA at 3.4   01/16/2018 Initial Diagnosis   Cancer of left colon ([place])   01/23/2018 Imaging   CT CHEST WO CONTRAST IMPRESSION: 1. No evidence for metastatic disease within the chest. 2. Small left pleural effusion with underlying opacities which may represent atelectasis. Right basilar atelectasis. 3. Few foci of gas within the upper abdomen in the omentum with surrounding fat stranding, likely postsurgical 4. Aortic Atherosclerosis (ICD10-I70.0).   03/08/2018 - 05/15/2018 Chemotherapy   adjuvant FOLOFX. Due to side effects of neuropahty Oxaliplatin was stopped after 3 cycles and chemo was stopped after 6 cycles. He declined completing 6 months of chemo treatment.     03/19/2018 Imaging   03/19/2018 CT AP IMPRESSION: 1. Interval partial left hemicolectomy and descending colostomy. 2. Heterogeneous soft tissue density along the left anterior renal fascia is most likely postoperative (favor fat necrosis). No well-defined fluid collection. 3. Mild left lower quadrant edema, new since 01/10/2018. This could be postoperative. Superimposed sigmoid diverticulitis and/or cystitis cannot be excluded. 4. Subtle hyperenhancing nodule within the anterior bladder dome cannot be excluded. Consider nonemergent cystoscopy. When this is performed, recommend attention to the left ureterovesicular junction and distal left ureter to evaluate questionable soft tissue fullness. 5. Cholelithiasis. 6.  Aortic Atherosclerosis (ICD10-I70.0). 7. Prostatomegaly.   11/13/2018 Imaging   CT CAP  WO  Contrast 11/13/18  IMPRESSION: 1. Reversal of left lower quadrant colostomy with sigmoid colon anastomosis. No complicating features. No findings for residual or recurrent tumor or metastatic disease involving the chest, abdomen or pelvis without contrast. 2. No acute abdominal/pelvic findings. 3. Gallbladder sludge and gallstones but no findings for acute cholecystitis. 4. Stable anterior abdominal wall hernia. 5. The right testicle is in the right inguinal canal.   10/03/2019 Imaging   CT CAP WO contrast  IMPRESSION: 1. New rounded density interposed between the prostate gland and anterior upper rectal wall could represent adenopathy or local extension of anterior rectal tumor. 2. Marked prostatomegaly, prostate volume 150 cubic cm. 3. Other imaging findings of potential clinical significance: Aortic Atherosclerosis (ICD10-I70.0). Coronary atherosclerosis. Trace right pleural effusion. Suspected cholelithiasis. Nonobstructive left nephrolithiasis. Multilevel lumbar impingement. Bilateral mildly retracted testicles. Hypodense exophytic lesion of the right kidney, most likely to be a cyst.   11/02/2019 Procedure   colonoscopy on 11/02/2019 by Dr. [name] Plan showed normal digital rectal exam, 3 polyps in the rectum, descending colon and cecum, and a prior sigmoid: Anastomosis characterized by erythema.  He found an extrinsic nonobstructing medium-sized mass in the proximal rectum about 4 cm in length, no internal rectal mass.   Diagnosis Surgical [P], colon, cecum, descending, rectal, polyp (3) - TUBULAR ADENOMA (TWO) - NO HIGH GRADE DYSPLASIA OR CARCINOMA. - COLONIC FRAGMENT WITH BENIGN LYMPHOID AGGREGATE.   12/07/2019 Imaging   MRI pelvis IMPRESSION: 1. Masslike area in the rectum suspicious for rectal neoplasm, likely T4b based on the appearance of soft tissue extending along the anterior peritoneal reflection and into the seminal vesicles. Correlation with recent colonoscopy results  may be helpful. Area of anastomosis and other areas of the pelvis are not imaged on today's exam.   12/21/2019 PET scan   IMPRESSION: 1. Unfortunately evidence for peritoneal metastasis. Intensely hypermetabolic nodules along the LEFT pericolic gutter. Favor hypermetabolic mass anterior to the rectum to represent serosal implant along the ventral surface of the rectum. 2. local recurrence within the LEFT abdominal wall at site prior colostomy. Intense hypermetabolic thickening of the rectus muscle at this site. 3. Two hypermetabolic hepatic metastasis.   01/15/2020 Relapse/Recurrence   FINAL MICROSCOPIC DIAGNOSIS:   A. SOFT TISSUE, LEFT ABDOMINAL WALL, BIOPSY:  - Adenocarcinoma.  - See comment.   COMMENT:   The morphology is consistent with metastatic colorectal adenocarcinoma.    01/28/2020 -  Chemotherapy   First line FOLFIRI q2weeks starting 01/28/20 for 8 cycles.  -----Bevacizumab added with C2.  -----Changed to maintenance Xeloda 2000 mg twice daily for 2 weeks on/1 week off and bevacizumab 06/02/20     05/05/2020 Imaging   IMPRESSION: 1. Improved appearance, with reduced size of the hepatic metastatic lesions and reduced size of the peritoneal tumor implants. 2. Other imaging findings of potential clinical significance: Notable prostatomegaly. Multilevel impingement in the lumbar spine. Dependent density in the gallbladder possibly from sludge or gallstones. Degenerative glenohumeral arthropathy bilaterally. 3. Aortic atherosclerosis.     CURRENT THERAPY: First line FOLFIRI q2 weeks, starting 01/28/20 x8 cycles, changed to maintenance Xeloda and bevacizumab 06/02/20. Status post 1 cycle  INTERVAL HISTORY: Dave Vaughn 75 y.o. male returns to the clinic today for a follow up visit. The patient is feeling well today without any concerning complaints except for mild soreness of the soles of his feet. He states it started about 1 week ago. He characterizes it as feeling  like a sunburn. No erythema or skin peeling. Denies symptoms on  the palm of his hand. His appointment for cycle #2 was delayed a few days due to the snow storm. He is here today for labs and evaluation and infusion on 06/29/20. He denies any fever, chills, night sweats, or weight loss. He denies abdominal pain, nausea, vomiting, or constipation. He reports periodic diarrhea at baseline/ Denies changes with diarrhea. He denies any abnormal bleeding or bruising in the interval. He started his Xeloda on 06/23/20. He is here for evaluation and repeat labs.     MEDICAL HISTORY: Past Medical History:  Diagnosis Date  . Adenocarcinoma, colon ([place]) dx'd 01/2018  . Anemia    taking iron supplements  . Anxiety   . Atrial fibrillation with RVR ([place])   . Colonic obstruction ([place]) 01/10/2018  . Depression   . Diverticulitis   . Dysrhythmia    afib  . History of kidney stones   . Hypertension     ALLERGIES:  has No Known Allergies.  MEDICATIONS:  Current Outpatient Medications  Medication Sig Dispense Refill  . apixaban (ELIQUIS) 5 MG TABS tablet Take 1 tablet (5 mg total) by mouth 2 (two) times daily. 60 tablet 0  . b complex vitamins tablet Take 1 tablet by mouth daily.    . capecitabine (XELODA) 500 MG tablet Take 4 tablets (2000 mg total) by mouth every 12 hours. Take within 30 minutes of AM and PM meals. Take for 14 days on, then 7 days off. Repeat every 21 days. 112 tablet 1  . diltiazem (CARDIZEM CD) 360 MG 24 hr capsule Take 1 capsule (360 mg total) by mouth daily. 90 capsule 3  . diphenoxylate-atropine (LOMOTIL) 2.5-0.025 MG tablet Take 1-2 tablets by mouth 4 (four) times daily as needed for diarrhea or loose stools. 60 tablet 1  . docusate sodium (COLACE) 100 MG capsule Take 100 mg by mouth 2 (two) times daily.    [name] levothyroxine (SYNTHROID) 75 MCG tablet Take 1 tablet (75 mcg total) by mouth daily. 90 tablet 1  . Multiple Vitamins-Iron (MULTIVITAMIN/IRON PO) Take 1 tablet by mouth daily.      . prochlorperazine (COMPAZINE) 10 MG tablet Take 1 tablet (10 mg total) by mouth every 6 (six) hours as needed for nausea or vomiting. 30 tablet 0  . tamsulosin (FLOMAX) 0.4 MG CAPS capsule Take 0.4 mg by mouth 2 (two) times daily.      No current facility-administered medications for this visit.    SURGICAL HISTORY:  Past Surgical History:  Procedure Laterality Date  . ANKLE SURGERY Left    when he was in college  . COLON RESECTION N/A 01/11/2018   Procedure: LEFT COLON RESECTION, TAKEDOWN SPLENIC FLEXURE, COLOSTOMY;  Surgeon: [name], MD;  Location: [facility];  Service: General;  Laterality: N/A;  . COLONOSCOPY  01/11/2018   Procedure: COLONOSCOPY;  Surgeon: Jackquline Denmark, MD;  Location: [facility];  Service: Endoscopy;;  . COLONOSCOPY  05/10/2018  . colonscopy  05/10/2018  . HERNIA REPAIR    . HERNIA REPAIR  03/02/2019   EXPLORATORY LAPAROTOMY (N/A Abdomen)  . INCISIONAL HERNIA REPAIR N/A 03/02/2019   Procedure: INCISIONAL HERNIA REPAIR , RECTORECTUS VS TAR HERNIA REPAIR;  Surgeon: Ralene Ok, MD;  Location: [facility];  Service: General;  Laterality: N/A;  . INSERTION OF MESH N/A 03/02/2019   Procedure: Insertion Of Mesh;  Surgeon: Ralene Ok, MD;  Location: [facility];  Service: General;  Laterality: N/A;  . LAPAROTOMY N/A 03/02/2019   Procedure: EXPLORATORY LAPAROTOMY;  Surgeon: Ralene Ok, MD;  Location:  [facility];  Service: General;  Laterality: N/A;  . LYSIS OF ADHESION N/A 06/12/2018   Procedure: LYSIS OF ADHESIONS;  Surgeon: [name], MD;  Location: [facility];  Service: General;  Laterality: N/A;  . LYSIS OF ADHESION N/A 03/02/2019   Procedure: Lysis Of Adhesion;  Surgeon: Ralene Ok, MD;  Location: [facility];  Service: General;  Laterality: N/A;  . PORTACATH PLACEMENT Right 03/07/2018   Procedure: INSERTION PORT-A-CATH RIGHT SUBCLAVIAN;  Surgeon: [name], MD;  Location: [facility];  Service: General;  Laterality: Right;  . PROCTOSCOPY N/A 06/12/2018   Procedure: RIGID  PROCTOSCOPY;  Surgeon: [name], MD;  Location: [facility];  Service: General;  Laterality: N/A;  . thumb surgery   2018   cyst removal    REVIEW OF SYSTEMS:   Review of Systems  Constitutional: Negative for appetite change, chills, fatigue, fever and unexpected weight change.  HENT: Negative for mouth sores, nosebleeds, sore throat and trouble swallowing.   Eyes: Negative for eye problems and icterus.  Respiratory: Negative for cough, hemoptysis, shortness of breath and wheezing.   Cardiovascular: Negative for chest pain and leg swelling.  Gastrointestinal: Negative for abdominal pain, constipation, diarrhea, nausea and vomiting.  Genitourinary: Negative for bladder incontinence, difficulty urinating, dysuria, frequency and hematuria.   Musculoskeletal: Negative for back pain, gait problem, neck pain and neck stiffness.  Skin: Positive for mild soreness on the soles of his feet. Negative for itching and rash.  Neurological: Negative for dizziness, extremity weakness, gait problem, headaches, light-headedness and seizures.  Hematological: Negative for adenopathy. Does not bruise/bleed easily.  Psychiatric/Behavioral: Negative for confusion, depression and sleep disturbance. The patient is not nervous/anxious.     PHYSICAL EXAMINATION:  Blood pressure 124/78, pulse 78, temperature 98.6 F (37 C), temperature source Tympanic, resp. rate 17, height 5' 11"  (1.803 m), weight 237 lb 9.6 oz (107.8 kg), SpO2 99 %.  ECOG PERFORMANCE STATUS: 1 - Symptomatic but completely ambulatory  Physical Exam  Constitutional: Oriented to person, place, and time and well-developed, well-nourished, and in no distress.  HENT:  Head: Normocephalic and atraumatic.  Mouth/Throat: Oropharynx is clear and moist. No oropharyngeal exudate.  Eyes: Conjunctivae are normal. Right eye exhibits no discharge. Left eye exhibits no discharge. No scleral icterus.  Neck: Normal range of motion. Neck supple.   Cardiovascular: Normal rate, regular rhythm, normal heart sounds and intact distal pulses.   Pulmonary/Chest: Effort normal and breath sounds normal. No respiratory distress. No wheezes. No rales.  Abdominal: Soft. Bowel sounds are normal. Exhibits no distension and no mass. There is no tenderness.  Musculoskeletal: Normal range of motion. Exhibits no edema.  Lymphadenopathy:    No cervical adenopathy.  Neurological: Alert and oriented to person, place, and time. Exhibits normal muscle tone. Gait normal. Coordination normal.  Skin: Skin is warm and dry. No rash noted. Not diaphoretic. No erythema. No pallor.  Psychiatric: Mood, memory and judgment normal.  Vitals reviewed.  LABORATORY DATA: Lab Results  Component Value Date   WBC 4.8 06/27/2020   HGB 12.8 (L) 06/27/2020   HCT 38.2 (L) 06/27/2020   MCV 98.5 06/27/2020   PLT 159 06/27/2020      Chemistry      Component Value Date/Time   NA 142 06/27/2020 1443   K 4.0 06/27/2020 1443   CL 108 06/27/2020 1443   CO2 25 06/27/2020 1443   BUN 24 (H) 06/27/2020 1443   CREATININE 1.17 06/27/2020 1443      Component Value Date/Time   CALCIUM  8.7 (L) 06/27/2020 1443   ALKPHOS 70 06/27/2020 1443   AST 22 06/27/2020 1443   ALT 15 06/27/2020 1443   BILITOT 0.9 06/27/2020 1443       RADIOGRAPHIC STUDIES:  No results found.   ASSESSMENT/PLAN:  Dave Vaughn a 74y.o.malewith    1. Cancer of left colon,adenocarcinoma, stage IIIB(pT3N1cM0), MSI-stable, KRAS+ -Diagnosed in 01/2018. Treated with surgery andadjuvantFOLFOX. Due to side effects Oxaliplatin was stopped after 3 cycles and chemo was stopped after 6 cycles. He declined completing 6 months of chemo treatment. -He underwent hernia repair in 02/2019. He recovered well.  -SurveillanceCT CAP from4/28/21shows no definitive evidence of metastasis, except a soft tissue density in between prostate and rectum, indeterminate.  -colonoscopy in December 2019 showed  mucosal inflammation in the rectum, biopsy was negative. -Rising CEA -Repeat colonoscopy on 11/02/2019 by Dr. [name] Plan showed normal digital rectal exam, 3 polyps in the rectum, descending colon and cecum, and a prior sigmoid: Anastomosis characterized by erythema.He found an extrinsic nonobstructing medium-sized mass in the proximal rectum about 4 cm in length,no internal rectal mass. -He underwent pelvic MRI on 12/06/2019 which was reviewed in tumor board, showing a masslike area in the rectum suspicious for rectal neoplasm, likely T4b based on the appearance of soft tissue extending along the anterior peritoneal reflection and into the seminal vesicles.  -His PET from 12/21/2019 shows peritoneal metastasis, 2 hypermetabolic hepatic metastases, and local recurrence within the left abdominal wallwhich was palpable on exam 01/28/20.His ultrasound biopsy of the left abdominal wall from 01/15/2020 shows metastatic colorectal adenocarcinoma. -It was discussed that this is recurrent metastatic stage IV colon cancer, not resectable but still treatable. Given his good nutrition and PS he will likely tolerate treatment well. The goal is palliative -Began first line dose-reducedFOLFIRIon8/23/2021 -FoundationOneshowed Kras (+)positive, he is not a candidate for EGFR inhibitor. Bevacizumab was added with cycle 2 -S/p 8 cycles of FOLFIRI and bevacizumab from cycle 2; tolerated well with fatigue, nausea, diarrhea.CEAnormalized after cycle 4 -CT AP 05/05/20 showed marked improvement in liver and peritoneal metastases.  Plan is to proceed with maintenance bevacizumab 7.5 mg/kg and Xeloda 2000 mg BID days 1-14 q21 days, starting 06/02/2020. He is status post 1 cycle and tolerating it well. He started developing mild soreness on the soles of his feet without overlying skin changes.  -Labs were reviewed today which are adequate for treatment. Recommend he proceed with C2 on 06/29/20 as scheduled. However, due to  the snow storm affecting his schedule, we would like to get him back on track. For the next cycle of treatment on 06/29/20, Dr. Burr Medico will reduce the dose of avastin to 5 mg/kg. Therefore, we will keep cycle #3 scheduled in 2 weeks. (February 7th) as opposed to 3 weeks.  -F/U in 2 weeks with cycle #3  2. Mild Soreness of soles of feet -Started about 1 week ago -No overlying erythema or peeling -Patient advised to use urea cream or udder cream. If symptoms not controlled, advised he could use hydrocortisone cream.   3. Hypothyroidism -Previously on synthroid, TSH normal 2 years ago. He has not been taking synthroid for a while -TSH in 02/2020 up to 67, An urgent message was previously sent to PCP for management.  4.BPH  -Followed by urologist Dr. Lovena Neighbours -Pelvic MRI on 7/1 shows marked prostatomegaly with signs of BPH extending into the bladder base. -Denies new or worsening urinary symptoms  5. Atrial Fibrillation -continue Eliquis. Rate controlled.He can stop Eliquis periodically for bleeding hemorrhoids -Continue follow-up  with cardiology  6.CKD stage III -he has developed mild increased Cr since he started chemo, EGFR around 40-50's -Dr. Burr Medico previously encouraged him to control his blood pressure, cholesterol and BG and also encouraged him to drink plenty of water. -Will avoid NSAIDs and CT IV contrast if GFR low  Disposition: Dave Vaughn appears to be doing well.  He tolerated his first cycle of Xeloda/bev well except for mild soreness of the soles of his feet. I reviewed symptom management with the patient.  Labs reviewed, recommend he proceed with avastin as scheduled on 06/29/20. To get his appointments back on track, he will receive reduced dose 5 mg/kg of avastin on 06/29/20. Therefore, we will start cycle #3 on 07/14/20 with avastin (in two weeks) which is when he is scheduled to start his next cycle of xeloda on 07/14/20.    He will return for lab and follow-up in 2 weeks  before cycle 3.    No orders of the defined types were placed in this encounter.    I spent 20-29 minutes in this encounter.   Dave Carll L Derin Granquist, PA-C 06/27/20

## 2020-06-27 NOTE — Patient Instructions (Signed)

## 2020-06-29 ENCOUNTER — Other Ambulatory Visit: Payer: Self-pay | Admitting: Hematology

## 2020-06-29 ENCOUNTER — Other Ambulatory Visit: Payer: Self-pay

## 2020-06-29 ENCOUNTER — Inpatient Hospital Stay: Payer: Commercial Managed Care - PPO

## 2020-06-29 VITALS — BP 130/98 | HR 83 | Temp 98.1°F | Resp 18

## 2020-06-29 DIAGNOSIS — Z5112 Encounter for antineoplastic immunotherapy: Secondary | ICD-10-CM | POA: Diagnosis not present

## 2020-06-29 DIAGNOSIS — C186 Malignant neoplasm of descending colon: Secondary | ICD-10-CM

## 2020-06-29 MED ORDER — SODIUM CHLORIDE 0.9% FLUSH
10.0000 mL | INTRAVENOUS | Status: DC | PRN
  Administered 2020-06-29: 10 mL
  Filled 2020-06-29: qty 10

## 2020-06-29 MED ORDER — SODIUM CHLORIDE 0.9 % IV SOLN
Freq: Once | INTRAVENOUS | Status: AC
  Filled 2020-06-29: qty 250

## 2020-06-29 MED ORDER — HEPARIN SOD (PORK) LOCK FLUSH 100 UNIT/ML IV SOLN
500.0000 [IU] | Freq: Once | INTRAVENOUS | Status: AC | PRN
  Administered 2020-06-29: 500 [IU]
  Filled 2020-06-29: qty 5

## 2020-06-29 MED ORDER — SODIUM CHLORIDE 0.9 % IV SOLN
5.0000 mg/kg | Freq: Once | INTRAVENOUS | Status: AC
  Administered 2020-06-29: 500 mg via INTRAVENOUS
  Filled 2020-06-29: qty 4

## 2020-06-29 NOTE — Patient Instructions (Signed)
[facility] Discharge Instructions for Patients Receiving Chemotherapy  Today you received the following chemotherapy agents: bevacizumab  To help prevent nausea and vomiting after your treatment, we encourage you to take your nausea medication as directed.   If you develop nausea and vomiting that is not controlled by your nausea medication, call the clinic.   BELOW ARE SYMPTOMS THAT SHOULD BE REPORTED IMMEDIATELY:  *FEVER GREATER THAN 100.5 F  *CHILLS WITH OR WITHOUT FEVER  NAUSEA AND VOMITING THAT IS NOT CONTROLLED WITH YOUR NAUSEA MEDICATION  *UNUSUAL SHORTNESS OF BREATH  *UNUSUAL BRUISING OR BLEEDING  TENDERNESS IN MOUTH AND THROAT WITH OR WITHOUT PRESENCE OF ULCERS  *URINARY PROBLEMS  *BOWEL PROBLEMS  UNUSUAL RASH Items with * indicate a potential emergency and should be followed up as soon as possible.  Feel free to call the clinic should you have any questions or concerns. The clinic phone number is [phone].  Please show the CHEMO ALERT CARD at check-in to the Emergency Department and triage nurse.   

## 2020-06-30 ENCOUNTER — Other Ambulatory Visit: Payer: Self-pay | Admitting: Hematology

## 2020-06-30 DIAGNOSIS — C186 Malignant neoplasm of descending colon: Secondary | ICD-10-CM

## 2020-06-30 LAB — CEA (IN HOUSE-CHCC): CEA (CHCC-In House): 2.94 ng/mL (ref 0.00–5.00)

## 2020-06-30 MED ORDER — CAPECITABINE 500 MG PO TABS: ORAL_TABLET | ORAL | 1 refills | Status: DC

## 2020-06-30 MED ORDER — ONDANSETRON HCL 8 MG PO TABS
ORAL_TABLET | ORAL | Status: AC
  Filled 2020-06-30: qty 1

## 2020-07-03 ENCOUNTER — Telehealth: Payer: Self-pay

## 2020-07-03 NOTE — Telephone Encounter (Signed)
[name] called stating that Dave Vaughn is having increase pain on bottoms of his feet.  It is becoming difficult to walk.  He will finish his Xeloda on Sunday. Per Dr Burr Medico he is to stop Xeloda today and use hydrocortisone on his feet as well as urea lotion.  I relayed this to [name] and she verbalized understanding.

## 2020-07-11 NOTE — Progress Notes (Signed)
[place]   [phone] [phone]   Clinic Follow up Note   Patient Care Team: [name], MD as PCP - General (Family Medicine) [name], MD as Consulting Physician (Cardiology) [name], MD as Consulting Physician (Gastroenterology) [name], MD as Consulting Physician (General Surgery) [name], MD as Consulting Physician (General Surgery) [name], MD as Consulting Physician (Urology) [name], MD as Consulting Physician (Medical Oncology)  Date of Service:  07/14/2020  CHIEF COMPLAINT: F/u of colon cancer   SUMMARY OF ONCOLOGIC HISTORY: Oncology History Overview Note  Cancer Staging Cancer of left colon [facility]) Staging form: Colon and Rectum, AJCC 8th Edition - Pathologic stage from 01/11/2018: Stage IIIB (pT3, pN1c, cM0) - Signed by [name], MD on 01/16/2018     Cancer of left colon ([place])  01/10/2018 Imaging   CT AP W Contrast 01/10/18  IMPRESSION: Irregular soft tissue density causing stricture of the mid descending colon likely the site of obstruction for the dilated small bowel. 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 No evidence of perforation.  Equivocal findings involving the appendix measuring 1.2 cm at the appendiceal tip with mucosal enhancement. No adjacent free fluid or inflammatory change. Findings are nonspecific, but can be seen with early acute appendicitis.  Mild prostatic enlargement. Increased density over the posterior bladder base likely due to the large prostatic impression although cannot completely exclude a bladder mass. Urology protocol CT or ultrasound may be helpful for better evaluation.  Mild cholelithiasis.  Stable 1.5 cm cystic structure over the lower pole right kidney likely slightly hyperdense cyst.  Diverticulosis of the colon.  Aortic Atherosclerosis (ICD10-I70.0).     01/11/2018 Cancer Staging   Staging form: Colon and Rectum, AJCC  8th Edition - Pathologic stage from 01/11/2018: Stage IIIB (pT3, pN1c, cM0) - Signed by [name], MD on 01/16/2018   01/11/2018 Surgery   LEFT COLON RESECTION, TAKEDOWN SPLENIC FLEXURE, COLOSTOMY by Dr. [name]    01/11/2018 Procedure   Colonoscopy 01/11/18 by Dr. [name]  - Malignant completely obstructing tumor in the mid descending colon. Tattooed. - Diverticulosis in the sigmoid colon. - Non-bleeding internal hemorrhoids. - No specimens collected.   01/11/2018 Pathology Results   Diagnosis 01/11/18  1. Colon, segmental resection for tumor, descending colon - INVASIVE COLORECTAL ADENOCARCINOMA, 4 CM. - TUMOR EXTENDS INTO PERICOLONIC CONNECTIVE TISSUE. - TUMOR FOCALLY INVOLVES RADIAL MARGIN. - ONE MESENTERIC TUMOR DEPOSIT. - THIRTEEN BENIGN LYMPH NODES (0/13). 2. Colon, segmental resection, splenic flexure - BENIGN COLON. - NO EVIDENCE OF MALIGNANCY .   01/11/2018 Tumor Marker   Baseline CEA at 3.4   01/16/2018 Initial Diagnosis   Cancer of left colon ([place])   01/23/2018 Imaging   CT CHEST WO CONTRAST IMPRESSION: 1. No evidence for metastatic disease within the chest. 2. Small left pleural effusion with underlying opacities which may represent atelectasis. Right basilar atelectasis. 3. Few foci of gas within the upper abdomen in the omentum with surrounding fat stranding, likely postsurgical 4. Aortic Atherosclerosis (ICD10-I70.0).   03/08/2018 - 05/15/2018 Chemotherapy   adjuvant FOLOFX. Due to side effects of neuropahty Oxaliplatin was stopped after 3 cycles and chemo was stopped after 6 cycles. He declined completing 6 months of chemo treatment.     03/19/2018 Imaging   03/19/2018 CT AP IMPRESSION: 1. Interval partial left hemicolectomy and descending colostomy. 2. Heterogeneous soft tissue density along the left anterior renal fascia is most likely postoperative (favor fat necrosis). No well-defined fluid collection. 3. Mild left lower  quadrant edema, new since 01/10/2018. This  could be postoperative. Superimposed sigmoid diverticulitis and/or cystitis cannot be excluded. 4. Subtle hyperenhancing nodule within the anterior bladder dome cannot be excluded. Consider nonemergent cystoscopy. When this is performed, recommend attention to the left ureterovesicular junction and distal left ureter to evaluate questionable soft tissue fullness. 5. Cholelithiasis. 6.  Aortic Atherosclerosis (ICD10-I70.0). 7. Prostatomegaly.   11/13/2018 Imaging   CT CAP WO Contrast 11/13/18  IMPRESSION: 1. Reversal of left lower quadrant colostomy with sigmoid colon anastomosis. No complicating features. No findings for residual or recurrent tumor or metastatic disease involving the chest, abdomen or pelvis without contrast. 2. No acute abdominal/pelvic findings. 3. Gallbladder sludge and gallstones but no findings for acute cholecystitis. 4. Stable anterior abdominal wall hernia. 5. The right testicle is in the right inguinal canal.   10/03/2019 Imaging   CT CAP WO contrast  IMPRESSION: 1. New rounded density interposed between the prostate gland and anterior upper rectal wall could represent adenopathy or local extension of anterior rectal tumor. 2. Marked prostatomegaly, prostate volume 150 cubic cm. 3. Other imaging findings of potential clinical significance: Aortic Atherosclerosis (ICD10-I70.0). Coronary atherosclerosis. Trace right pleural effusion. Suspected cholelithiasis. Nonobstructive left nephrolithiasis. Multilevel lumbar impingement. Bilateral mildly retracted testicles. Hypodense exophytic lesion of the right kidney, most likely to be a cyst.   11/02/2019 Procedure   colonoscopy on 11/02/2019 by Dr. [name] Plan showed normal digital rectal exam, 3 polyps in the rectum, descending colon and cecum, and a prior sigmoid: Anastomosis characterized by erythema.  He found an extrinsic nonobstructing medium-sized mass in the proximal rectum about 4 cm in length, no internal  rectal mass.   Diagnosis Surgical [P], colon, cecum, descending, rectal, polyp (3) - TUBULAR ADENOMA (TWO) - NO HIGH GRADE DYSPLASIA OR CARCINOMA. - COLONIC FRAGMENT WITH BENIGN LYMPHOID AGGREGATE.   12/07/2019 Imaging   MRI pelvis IMPRESSION: 1. Masslike area in the rectum suspicious for rectal neoplasm, likely T4b based on the appearance of soft tissue extending along the anterior peritoneal reflection and into the seminal vesicles. Correlation with recent colonoscopy results may be helpful. Area of anastomosis and other areas of the pelvis are not imaged on today's exam.   12/21/2019 PET scan   IMPRESSION: 1. Unfortunately evidence for peritoneal metastasis. Intensely hypermetabolic nodules along the LEFT pericolic gutter. Favor hypermetabolic mass anterior to the rectum to represent serosal implant along the ventral surface of the rectum. 2. local recurrence within the LEFT abdominal wall at site prior colostomy. Intense hypermetabolic thickening of the rectus muscle at this site. 3. Two hypermetabolic hepatic metastasis.   01/15/2020 Relapse/Recurrence   FINAL MICROSCOPIC DIAGNOSIS:   A. SOFT TISSUE, LEFT ABDOMINAL WALL, BIOPSY:  - Adenocarcinoma.  - See comment.   COMMENT:   The morphology is consistent with metastatic colorectal adenocarcinoma.    01/28/2020 -  Chemotherapy   First line FOLFIRI q2weeks starting 01/28/20 for 8 cycles.  -----Bevacizumab added with C2.  -----Changed to maintenance Xeloda 2000 mg twice daily for 2 weeks on/1 week off and bevacizumab 06/02/20     05/05/2020 Imaging   IMPRESSION: 1. Improved appearance, with reduced size of the hepatic metastatic lesions and reduced size of the peritoneal tumor implants. 2. Other imaging findings of potential clinical significance: Notable prostatomegaly. Multilevel impingement in the lumbar spine. Dependent density in the gallbladder possibly from sludge or gallstones. Degenerative glenohumeral  arthropathy bilaterally. 3. Aortic atherosclerosis.      CURRENT THERAPY:  First line FOLFIRI q2weeks starting 01/28/20 for 8 cycles. Bevacizumab added  with C2. Changed to maintenance Xeloda 2000 mg twice daily for 2 weeks on/1 week off and bevacizumab 06/02/20. C3 dose reduce to 1575m BID due to skin toxicity.    INTERVAL HISTORY:  Dave CASPERSis here for a follow up. He presents to the clinic alone. He notes he is doing well. He notes today he had [place] [facility] [place] [facility]. He notes skin rash on b/l forearms and feet tenderness from last cycle chemo with Xeloda. He notes he rather continue Xeloda than return to 5FU pump. He notes with topical medication his rash much improved. He also notes having diarrhea but manageable and intermittent based on diet.  He denies nausea and notes his appetite is adequate. He is able to maintain weight. He is able to maintain his energy, working and exercise.     REVIEW OF SYSTEMS:   Constitutional: Denies fevers, chills or abnormal weight loss Eyes: Denies blurriness of vision Ears, nose, mouth, throat, and face: Denies mucositis or sore throat Respiratory: Denies cough, dyspnea or wheezes Cardiovascular: Denies palpitation, chest discomfort or lower extremity swelling Gastrointestinal:  Denies nausea, heartburn (+) intermittent diarrhea  Skin: (+) Healing skin rash of forearms.  Lymphatics: Denies new lymphadenopathy or easy bruising Neurological:Denies numbness, tingling or new weaknesses Behavioral/Psych: Mood is stable, no new changes  All other systems were reviewed with the patient and are negative.  MEDICAL HISTORY:  Past Medical History:  Diagnosis Date  . Adenocarcinoma, colon ([place] dx'd 01/2018  . Anemia    taking iron supplements  . Anxiety   . Atrial fibrillation with RVR ([place]   . [place] 01/10/2018  . Depression   . Diverticulitis   . Dysrhythmia    afib  . History of kidney  stones   . Hypertension     SURGICAL HISTORY: Past Surgical History:  Procedure Laterality Date  . ANKLE SURGERY Left    when he was in college  . COLON RESECTION N/A 01/11/2018   Procedure: LEFT COLON RESECTION, TAKEDOWN SPLENIC FLEXURE, COLOSTOMY;  Surgeon: IFanny Skates MD;  Location: [facility];  Service: General;  Laterality: N/A;  . COLONOSCOPY  01/11/2018   Procedure: COLONOSCOPY;  Surgeon: GJackquline Denmark MD;  Location: [facility];  Service: Endoscopy;;  . COLONOSCOPY  05/10/2018  . colonscopy  05/10/2018  . HERNIA REPAIR    . HERNIA REPAIR  03/02/2019   EXPLORATORY LAPAROTOMY (N/A Abdomen)  . INCISIONAL HERNIA REPAIR N/A 03/02/2019   Procedure: INCISIONAL HERNIA REPAIR , RECTORECTUS VS TAR HERNIA REPAIR;  Surgeon: RRalene Ok MD;  [place] [facility]  Service: General;  Laterality: N/A;  . INSERTION OF MESH N/A 03/02/2019   Procedure: Insertion Of Mesh;  Surgeon: RRalene Ok MD;  [place] [facility]  Service: General;  Laterality: N/A;  . LAPAROTOMY N/A 03/02/2019   Procedure: EXPLORATORY LAPAROTOMY;  Surgeon: RRalene Ok MD;  [place] [facility]  Service: General;  Laterality: N/A;  . LYSIS OF ADHESION N/A 06/12/2018   Procedure: LYSIS OF ADHESIONS;  Surgeon: GMichael Boston MD;  [facility];  Service: General;  Laterality: N/A;  . LYSIS OF ADHESION N/A 03/02/2019   Procedure: Lysis Of Adhesion;  Surgeon: RRalene Ok MD;  [place] [facility]  Service: General;  Laterality: N/A;  . PORTACATH PLACEMENT Right 03/07/2018   Procedure: INSERTION PORT-A-CATH RIGHT SUBCLAVIAN;  Surgeon: IFanny Skates MD;  [place] [facility]  Service: General;  Laterality: Right;  . PROCTOSCOPY N/A 06/12/2018   Procedure: RIGID PROCTOSCOPY;  Surgeon: [name], MD;  Location: [facility];  Service: General;  Laterality: N/A;  . thumb surgery   2018   cyst removal    I have reviewed the social history and family history with the patient and they are unchanged from previous note.  ALLERGIES:  has No  Known Allergies.  MEDICATIONS:  Current Outpatient Medications  Medication Sig Dispense Refill  . lidocaine-prilocaine (EMLA) cream Apply 1 application topically as needed. 30 g 1  . urea (CARMOL) 10 % cream Apply topically as needed. 71 g 0  . apixaban (ELIQUIS) 5 MG TABS tablet Take 1 tablet (5 mg total) by mouth 2 (two) times daily. 60 tablet 0  . b complex vitamins tablet Take 1 tablet by mouth daily.    . capecitabine (XELODA) 500 MG tablet Take 4 tablets (2000 mg total) by mouth every 12 hours. Take within 30 minutes of AM and PM meals. Take for 14 days on, then 7 days off. Repeat every 21 days. 112 tablet 1  . diltiazem (CARDIZEM CD) 360 MG 24 hr capsule Take 1 capsule (360 mg total) by mouth daily. 90 capsule 3  . diphenoxylate-atropine (LOMOTIL) 2.5-0.025 MG tablet Take 1-2 tablets by mouth 4 (four) times daily as needed for diarrhea or loose stools. 60 tablet 1  . docusate sodium (COLACE) 100 MG capsule Take 100 mg by mouth 2 (two) times daily.    [name] levothyroxine (SYNTHROID) 75 MCG tablet Take 1 tablet (75 mcg total) by mouth daily. 90 tablet 1  . Multiple Vitamins-Iron (MULTIVITAMIN/IRON PO) Take 1 tablet by mouth daily.     . prochlorperazine (COMPAZINE) 10 MG tablet Take 1 tablet (10 mg total) by mouth every 6 (six) hours as needed for nausea or vomiting. 30 tablet 0  . tamsulosin (FLOMAX) 0.4 MG CAPS capsule Take 0.4 mg by mouth 2 (two) times daily.      No current facility-administered medications for this visit.   Facility-Administered Medications Ordered in Other Visits  Medication Dose Route Frequency Provider Last Rate Last Admin  . bevacizumab-awwb (MVASI) 800 mg in sodium chloride 0.9 % 100 mL chemo infusion  7.5 mg/kg (Treatment Plan Recorded) Intravenous Once [name], MD        PHYSICAL EXAMINATION: ECOG PERFORMANCE STATUS: 1 - Symptomatic but completely ambulatory  Vitals:   07/14/20 0926  BP: 140/87  Pulse: 78  Resp: 13  Temp: 97.8 F (36.6 C)  SpO2: 99%    Filed Weights   07/14/20 0926  Weight: 235 lb 8 oz (106.8 kg)    Due to COVID19 we will limit examination to appearance. Patient had no complaints.  GENERAL:alert, no distress and comfortable SKIN: skin color normal (+) healing skin rash of forearms and hands, dark skin discoloration on hands  EYES: normal, Conjunctiva are pink and non-injected, sclera clear  No leg edema  NEURO: alert & oriented x 3 with fluent speech   LABORATORY DATA:  I have reviewed the data as listed CBC Latest Ref Rng & Units 07/14/2020 06/27/2020 06/02/2020  WBC 4.0 - 10.5 K/uL 5.7 4.8 3.6(L)  Hemoglobin 13.0 - 17.0 g/dL 13.5 12.8(L) 12.8(L)  Hematocrit 39.0 - 52.0 % 38.5(L) 38.2(L) 38.4(L)  Platelets 150 - 400 K/uL 184 159 207     CMP Latest Ref Rng & Units 07/14/2020 06/27/2020 06/02/2020  Glucose 70 - 99 mg/dL 96 110(H) 138(H)  BUN 8 - 23 mg/dL 21 24(H) 22  Creatinine 0.61 - 1.24 mg/dL 1.22 1.17 1.40(H)  Sodium 135 - 145  mmol/L 139 142 140  Potassium 3.5 - 5.1 mmol/L 3.9 4.0 3.7  Chloride 98 - 111 mmol/L 109 108 103  CO2 22 - 32 mmol/L 22 25 28   Calcium 8.9 - 10.3 mg/dL 8.9 8.7(L) 9.1  Total Protein 6.5 - 8.1 g/dL 6.7 6.8 6.8  Total Bilirubin 0.3 - 1.2 mg/dL 0.9 0.9 0.8  Alkaline Phos 38 - 126 U/L 82 70 70  AST 15 - 41 U/L 21 22 25   ALT 0 - 44 U/L 18 15 23       RADIOGRAPHIC STUDIES: I have personally reviewed the radiological images as listed and agreed with the findings in the report. No results found.   ASSESSMENT & PLAN:  IZAYA NETHERTON is a 75 y.o. male with    1. Cancer of left colon, adenocarcinoma, stage IIIB (pT3N1cM0), MSI-stable, liver and peritoneal recurrence 12/2019 -Diagnosed in 01/2018. Treated with surgery and adjuvant FOLFOX. Due to side effects Oxaliplatin was stopped after 3 cycles and chemo was stopped after 6 cycles. He declined completing 6 months of chemo treatment.  -Unfortunately he had local recurrence in left abdominal wall with peritoneal metastasis, Two  hypermetabolic hepatic metastasis in 12/2019. His US Biopsy of abdominal wall from 01/2020 confirmed metastatic colorectal adenocarcinoma.  -His 01/15/20 FO results show Kras mutation, MSS, he is not a candidate for EGFR inhibitor or immunotherapy  -I started him on first-line FOLFIRI q2weeks on 01/28/20. Bevacizumab (MVASI) was added with C2. He completed 8 cycles before switching to maintenance Xeloda 2036m BID 2 weeks on/1 week off and Bevacizumab on 06/02/20.  -He tolerated C1 well overall. However, s/p C2 he developed moderate skin rash on forearm and feet sensitivity. Will reduce Xeloda to 15066mBID starting with C3. May slowly increase dose if tolerable.  -Labs reviewed and adequate to proceed with C3 Bevacizumab today and start next cycle Xeloda at 150050mID.  -F/u in 3 weeks. Plan to scan him before next cycle.     2. Comorbidities: Atrial Fibrillation, CKD stage III, BPH, Elevated TSH  -continue Eliquis. Continue follow-up with cardiology Dr HawLyla Sonlso continue to Followed with urologist Dr. WinLovena Neighbourselvic MRI on 7/1 shows marked prostatomegaly with signs of BPH extending into the bladder base -TSH on 02/15/20 at 91.79. He is on Synthroid since 02/16/20. Will continue to monitor.    3. Goal of care discussion  -The patient understands the goal of care is palliative. -He is full code now   4. Skin toxicity, secondary to Xeloda   -After C2 Xeloda he developed moderate skin rash of his forearms and sensitivity of his feet.  -Will reduce Xeloda dose. I encouraged him to use topical hydrocortisone cream and possibly Urea cream if needed.      Plan  -I called in EMLA cream  -Labs reviewed and adequate to proceed with C3 Bevecizumab -Start C3 Xeloda today at 1500m20mD today, for 2 weeks on and one week off. If he tolerates well, will increase dose slightly next cycle  -Lab, flush, F/u and Bevacizumab in 3 and 6  -CT CAP wo contrast in 2-3 weeks    No problem-specific Assessment &  Plan notes found for this encounter.   Orders Placed This Encounter  Procedures  . CT CHEST ABDOMEN PELVIS W CONTRAST    Standing Status:   Future    Standing Expiration Date:   07/14/2021    Order Specific Question:   If indicated for the ordered procedure, I authorize the administration of contrast media per Radiology  protocol    Answer:   Yes    Order Specific Question:   Preferred imaging location?    Answer:   [facility]    Order Specific Question:   Release to patient    Answer:   Immediate    Order Specific Question:   Is Oral Contrast requested for this exam?    Answer:   No oral contrast    Order Specific Question:   Reason for No Oral Contrast    Answer:   Other    Order Specific Question:   Please answer why no oral contrast is requested    Answer:   poor tolerance to oral contrast    Order Specific Question:   Reason for Exam (SYMPTOM  OR DIAGNOSIS REQUIRED)    Answer:   evaluate response to chemo   All questions were answered. The patient knows to call the clinic with any problems, questions or concerns. No barriers to learning was detected. The total time spent in the appointment was 30 minutes.     [name], MD 07/14/2020   I, Joslyn Devon, am acting as scribe for [name], MD.   I have reviewed the above documentation for accuracy and completeness, and I agree with the above.

## 2020-07-14 ENCOUNTER — Inpatient Hospital Stay: Payer: Commercial Managed Care - PPO

## 2020-07-14 ENCOUNTER — Inpatient Hospital Stay: Payer: Commercial Managed Care - PPO | Attending: Nurse Practitioner

## 2020-07-14 ENCOUNTER — Other Ambulatory Visit: Payer: Self-pay

## 2020-07-14 ENCOUNTER — Encounter: Payer: Self-pay | Admitting: Hematology

## 2020-07-14 ENCOUNTER — Telehealth: Payer: Self-pay | Admitting: Hematology

## 2020-07-14 ENCOUNTER — Inpatient Hospital Stay (HOSPITAL_BASED_OUTPATIENT_CLINIC_OR_DEPARTMENT_OTHER): Payer: Commercial Managed Care - PPO | Admitting: Hematology

## 2020-07-14 VITALS — BP 140/87 | HR 78 | Temp 97.8°F | Resp 13 | Ht 71.0 in | Wt 235.5 lb

## 2020-07-14 DIAGNOSIS — C787 Secondary malignant neoplasm of liver and intrahepatic bile duct: Secondary | ICD-10-CM | POA: Diagnosis not present

## 2020-07-14 DIAGNOSIS — C186 Malignant neoplasm of descending colon: Secondary | ICD-10-CM | POA: Insufficient documentation

## 2020-07-14 DIAGNOSIS — N183 Chronic kidney disease, stage 3 unspecified: Secondary | ICD-10-CM | POA: Diagnosis not present

## 2020-07-14 DIAGNOSIS — Z452 Encounter for adjustment and management of vascular access device: Secondary | ICD-10-CM | POA: Diagnosis not present

## 2020-07-14 DIAGNOSIS — I4891 Unspecified atrial fibrillation: Secondary | ICD-10-CM | POA: Diagnosis not present

## 2020-07-14 DIAGNOSIS — Z5112 Encounter for antineoplastic immunotherapy: Secondary | ICD-10-CM | POA: Insufficient documentation

## 2020-07-14 DIAGNOSIS — R946 Abnormal results of thyroid function studies: Secondary | ICD-10-CM | POA: Insufficient documentation

## 2020-07-14 DIAGNOSIS — I129 Hypertensive chronic kidney disease with stage 1 through stage 4 chronic kidney disease, or unspecified chronic kidney disease: Secondary | ICD-10-CM | POA: Insufficient documentation

## 2020-07-14 DIAGNOSIS — C786 Secondary malignant neoplasm of retroperitoneum and peritoneum: Secondary | ICD-10-CM | POA: Diagnosis not present

## 2020-07-14 DIAGNOSIS — N400 Enlarged prostate without lower urinary tract symptoms: Secondary | ICD-10-CM | POA: Insufficient documentation

## 2020-07-14 LAB — CMP (CANCER CENTER ONLY)
ALT: 18 U/L (ref 0–44)
AST: 21 U/L (ref 15–41)
Albumin: 4 g/dL (ref 3.5–5.0)
Alkaline Phosphatase: 82 U/L (ref 38–126)
Anion gap: 8 (ref 5–15)
BUN: 21 mg/dL (ref 8–23)
CO2: 22 mmol/L (ref 22–32)
Calcium: 8.9 mg/dL (ref 8.9–10.3)
Chloride: 109 mmol/L (ref 98–111)
Creatinine: 1.22 mg/dL (ref 0.61–1.24)
GFR, Estimated: >60 mL/min (ref >60)
Glucose, Bld: 96 mg/dL (ref 70–99)
Potassium: 3.9 mmol/L (ref 3.5–5.1)
Sodium: 139 mmol/L (ref 135–145)
Total Bilirubin: 0.9 mg/dL (ref 0.3–1.2)
Total Protein: 6.7 g/dL (ref 6.5–8.1)

## 2020-07-14 LAB — CBC WITH DIFFERENTIAL (CANCER CENTER ONLY)
Abs Immature Granulocytes: 0.01 10*3/uL (ref 0.00–0.07)
Basophils Absolute: 0.1 10*3/uL (ref 0.0–0.1)
Basophils Relative: 1 %
Eosinophils Absolute: 0.2 10*3/uL (ref 0.0–0.5)
Eosinophils Relative: 3 %
HCT: 38.5 % — ABNORMAL LOW (ref 39.0–52.0)
Hemoglobin: 13.5 g/dL (ref 13.0–17.0)
Immature Granulocytes: 0 %
Lymphocytes Relative: 29 %
Lymphs Abs: 1.7 10*3/uL (ref 0.7–4.0)
MCH: 34.2 pg — ABNORMAL HIGH (ref 26.0–34.0)
MCHC: 35.1 g/dL (ref 30.0–36.0)
MCV: 97.5 fL (ref 80.0–100.0)
Monocytes Absolute: 0.5 10*3/uL (ref 0.1–1.0)
Monocytes Relative: 9 %
Neutro Abs: 3.3 10*3/uL (ref 1.7–7.7)
Neutrophils Relative %: 58 %
Platelet Count: 184 10*3/uL (ref 150–400)
RBC: 3.95 MIL/uL — ABNORMAL LOW (ref 4.22–5.81)
RDW: 15.9 % — ABNORMAL HIGH (ref 11.5–15.5)
WBC Count: 5.7 10*3/uL (ref 4.0–10.5)
nRBC: 0 % (ref 0.0–0.2)

## 2020-07-14 LAB — CEA (IN HOUSE-CHCC): CEA (CHCC-In House): 3.79 ng/mL (ref 0.00–5.00)

## 2020-07-14 MED ORDER — SODIUM CHLORIDE 0.9 % IV SOLN
7.5000 mg/kg | Freq: Once | INTRAVENOUS | Status: AC
  Administered 2020-07-14: 800 mg via INTRAVENOUS
  Filled 2020-07-14: qty 32

## 2020-07-14 MED ORDER — LIDOCAINE-PRILOCAINE 2.5-2.5 % EX CREA: 1.0000 "application " | TOPICAL_CREAM | CUTANEOUS | 1 refills | Status: DC | PRN

## 2020-07-14 MED ORDER — SODIUM CHLORIDE 0.9 % IV SOLN
Freq: Once | INTRAVENOUS | Status: AC
  Filled 2020-07-14: qty 250

## 2020-07-14 MED ORDER — UREA 10 % EX CREA: TOPICAL_CREAM | CUTANEOUS | 0 refills | Status: DC | PRN

## 2020-07-14 NOTE — Telephone Encounter (Signed)
Left message with follow-up appointments per 2/7 los. Gave option to call back to reschedule if needed. 

## 2020-07-14 NOTE — Patient Instructions (Signed)
[facility] Discharge Instructions for Patients Receiving Chemotherapy  Today you received the following chemotherapy agents: bevacizumab  To help prevent nausea and vomiting after your treatment, we encourage you to take your nausea medication as directed.   If you develop nausea and vomiting that is not controlled by your nausea medication, call the clinic.   BELOW ARE SYMPTOMS THAT SHOULD BE REPORTED IMMEDIATELY:  *FEVER GREATER THAN 100.5 F  *CHILLS WITH OR WITHOUT FEVER  NAUSEA AND VOMITING THAT IS NOT CONTROLLED WITH YOUR NAUSEA MEDICATION  *UNUSUAL SHORTNESS OF BREATH  *UNUSUAL BRUISING OR BLEEDING  TENDERNESS IN MOUTH AND THROAT WITH OR WITHOUT PRESENCE OF ULCERS  *URINARY PROBLEMS  *BOWEL PROBLEMS  UNUSUAL RASH Items with * indicate a potential emergency and should be followed up as soon as possible.  Feel free to call the clinic should you have any questions or concerns. The clinic phone number is [phone].  Please show the CHEMO ALERT CARD at check-in to the Emergency Department and triage nurse.   

## 2020-07-14 NOTE — Progress Notes (Signed)
Pt discharged in no apparent distress. Pt left ambulatory without assistance. Pt aware of discharge instructions and verbalized understanding and had no further questions.  

## 2020-08-01 NOTE — Progress Notes (Signed)
[facility]   [phone] [phone]   Clinic Follow up Note   Patient Care Team: Koberlein, Junell C, MD as PCP - General (Family Medicine) Harwani, Mohan, MD as Consulting Physician (Cardiology) Stark, Malcolm T, MD as Consulting Physician (Gastroenterology) Gross, Nixon, MD as Consulting Physician (General Surgery) Ingram, Haywood, MD as Consulting Physician (General Surgery) Winter, Christopher Aaron, MD as Consulting Physician (Urology) Feng, Yan, MD as Consulting Physician (Medical Oncology)  Date of Service:  08/04/2020  CHIEF COMPLAINT: F/u of colon cancer   SUMMARY OF ONCOLOGIC HISTORY: Oncology History Overview Note  Cancer Staging Cancer of left colon (HCC) Staging form: Colon and Rectum, AJCC 8th Edition - Pathologic stage from 01/11/2018: Stage IIIB (pT3, pN1c, cM0) - Signed by Feng, Yan, MD on 01/16/2018     Cancer of left colon (HCC)  01/10/2018 Imaging   CT AP W Contrast 01/10/18  IMPRESSION: Irregular soft tissue density causing stricture of the mid descending colon likely the site of obstruction for the dilated small bowel. 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 No evidence of perforation.  Equivocal findings involving the appendix measuring 1.2 cm at the appendiceal tip with mucosal enhancement. No adjacent free fluid or inflammatory change. Findings are nonspecific, but can be seen with early acute appendicitis.  Mild prostatic enlargement. Increased density over the posterior bladder base likely due to the large prostatic impression although cannot completely exclude a bladder mass. Urology protocol CT or ultrasound may be helpful for better evaluation.  Mild cholelithiasis.  Stable 1.5 cm cystic structure over the lower pole right kidney likely slightly hyperdense cyst.  Diverticulosis of the colon.  Aortic Atherosclerosis (ICD10-I70.0).     01/11/2018 Cancer Staging   Staging form: Colon and Rectum, AJCC  8th Edition - Pathologic stage from 01/11/2018: Stage IIIB (pT3, pN1c, cM0) - Signed by Feng, Yan, MD on 01/16/2018   01/11/2018 Surgery   LEFT COLON RESECTION, TAKEDOWN SPLENIC FLEXURE, COLOSTOMY by Dr. Ingram    01/11/2018 Procedure   Colonoscopy 01/11/18 by Dr. Gupta  - Malignant completely obstructing tumor in the mid descending colon. Tattooed. - Diverticulosis in the sigmoid colon. - Non-bleeding internal hemorrhoids. - No specimens collected.   01/11/2018 Pathology Results   Diagnosis 01/11/18  1. Colon, segmental resection for tumor, descending colon - INVASIVE COLORECTAL ADENOCARCINOMA, 4 CM. - TUMOR EXTENDS INTO PERICOLONIC CONNECTIVE TISSUE. - TUMOR FOCALLY INVOLVES RADIAL MARGIN. - ONE MESENTERIC TUMOR DEPOSIT. - THIRTEEN BENIGN LYMPH NODES (0/13). 2. Colon, segmental resection, splenic flexure - BENIGN COLON. - NO EVIDENCE OF MALIGNANCY .   01/11/2018 Tumor Marker   Baseline CEA at 3.4   01/16/2018 Initial Diagnosis   Cancer of left colon (HCC)   01/23/2018 Imaging   CT CHEST WO CONTRAST IMPRESSION: 1. No evidence for metastatic disease within the chest. 2. Small left pleural effusion with underlying opacities which may represent atelectasis. Right basilar atelectasis. 3. Few foci of gas within the upper abdomen in the omentum with surrounding fat stranding, likely postsurgical 4. Aortic Atherosclerosis (ICD10-I70.0).   03/08/2018 - 05/15/2018 Chemotherapy   adjuvant FOLOFX. Due to side effects of neuropahty Oxaliplatin was stopped after 3 cycles and chemo was stopped after 6 cycles. He declined completing 6 months of chemo treatment.     03/19/2018 Imaging   03/19/2018 CT AP IMPRESSION: 1. Interval partial left hemicolectomy and descending colostomy. 2. Heterogeneous soft tissue density along the left anterior renal fascia is most likely postoperative (favor fat necrosis). No well-defined fluid collection. 3. Mild left lower   quadrant edema, new since 01/10/2018. This  could be postoperative. Superimposed sigmoid diverticulitis and/or cystitis cannot be excluded. 4. Subtle hyperenhancing nodule within the anterior bladder dome cannot be excluded. Consider nonemergent cystoscopy. When this is performed, recommend attention to the left ureterovesicular junction and distal left ureter to evaluate questionable soft tissue fullness. 5. Cholelithiasis. 6.  Aortic Atherosclerosis (ICD10-I70.0). 7. Prostatomegaly.   11/13/2018 Imaging   CT CAP WO Contrast 11/13/18  IMPRESSION: 1. Reversal of left lower quadrant colostomy with sigmoid colon anastomosis. No complicating features. No findings for residual or recurrent tumor or metastatic disease involving the chest, abdomen or pelvis without contrast. 2. No acute abdominal/pelvic findings. 3. Gallbladder sludge and gallstones but no findings for acute cholecystitis. 4. Stable anterior abdominal wall hernia. 5. The right testicle is in the right inguinal canal.   10/03/2019 Imaging   CT CAP WO contrast  IMPRESSION: 1. New rounded density interposed between the prostate gland and anterior upper rectal wall could represent adenopathy or local extension of anterior rectal tumor. 2. Marked prostatomegaly, prostate volume 150 cubic cm. 3. Other imaging findings of potential clinical significance: Aortic Atherosclerosis (ICD10-I70.0). Coronary atherosclerosis. Trace right pleural effusion. Suspected cholelithiasis. Nonobstructive left nephrolithiasis. Multilevel lumbar impingement. Bilateral mildly retracted testicles. Hypodense exophytic lesion of the right kidney, most likely to be a cyst.   11/02/2019 Procedure   colonoscopy on 11/02/2019 by Dr. Stark showed normal digital rectal exam, 3 polyps in the rectum, descending colon and cecum, and a prior sigmoid: Anastomosis characterized by erythema.  He found an extrinsic nonobstructing medium-sized mass in the proximal rectum about 4 cm in length, no internal  rectal mass.   Diagnosis Surgical [P], colon, cecum, descending, rectal, polyp (3) - TUBULAR ADENOMA (TWO) - NO HIGH GRADE DYSPLASIA OR CARCINOMA. - COLONIC FRAGMENT WITH BENIGN LYMPHOID AGGREGATE.   12/07/2019 Imaging   MRI pelvis IMPRESSION: 1. Masslike area in the rectum suspicious for rectal neoplasm, likely T4b based on the appearance of soft tissue extending along the anterior peritoneal reflection and into the seminal vesicles. Correlation with recent colonoscopy results may be helpful. Area of anastomosis and other areas of the pelvis are not imaged on today's exam.   12/21/2019 PET scan   IMPRESSION: 1. Unfortunately evidence for peritoneal metastasis. Intensely hypermetabolic nodules along the LEFT pericolic gutter. Favor hypermetabolic mass anterior to the rectum to represent serosal implant along the ventral surface of the rectum. 2. local recurrence within the LEFT abdominal wall at site prior colostomy. Intense hypermetabolic thickening of the rectus muscle at this site. 3. Two hypermetabolic hepatic metastasis.   01/15/2020 Relapse/Recurrence   FINAL MICROSCOPIC DIAGNOSIS:   A. SOFT TISSUE, LEFT ABDOMINAL WALL, BIOPSY:  - Adenocarcinoma.  - See comment.   COMMENT:   The morphology is consistent with metastatic colorectal adenocarcinoma.    01/28/2020 -  Chemotherapy   First line FOLFIRI q2weeks starting 01/28/20 for 8 cycles.  -----Bevacizumab added with C2.  -----Changed to maintenance Xeloda 2000 mg twice daily for 2 weeks on/1 week off and bevacizumab 06/02/20     05/05/2020 Imaging   IMPRESSION: 1. Improved appearance, with reduced size of the hepatic metastatic lesions and reduced size of the peritoneal tumor implants. 2. Other imaging findings of potential clinical significance: Notable prostatomegaly. Multilevel impingement in the lumbar spine. Dependent density in the gallbladder possibly from sludge or gallstones. Degenerative glenohumeral  arthropathy bilaterally. 3. Aortic atherosclerosis.      CURRENT THERAPY:  First line FOLFIRI q2weeks starting 01/28/20 for 8 cycles.Bevacizumabadded with C2.Changedto   maintenance Xeloda 2000 mg twice daily for 2 weeks on/1 week off and bevacizumab 06/02/20. C3 dose reduce to 1580m BID due to skin toxicity.   INTERVAL HISTORY:  Dave SANDLERis here for a follow up. He presents to the clinic alone. He notes he is tolerating his treatment. He notes sensitivity of his feet mildly more than his hands and sensitivity of his tongue. He notes he is not using mouthwash daily. He denies pain in his hands or feet. He notes having old skin rash on forearms. No itching. He notes occasional ankle swelling. He uses compression socks and elevate his feet. He notes his energy is better on this regimen.    REVIEW OF SYSTEMS:   Constitutional: Denies fevers, chills or abnormal weight loss Eyes: Denies blurriness of vision Ears, nose, mouth, throat, and face: Denies mucositis or sore throat Respiratory: Denies cough, dyspnea or wheezes Cardiovascular: Denies palpitation, chest discomfort (+) Occasional lower extremity swelling Gastrointestinal:  Denies nausea, heartburn or change in bowel habits Skin: Denies abnormal skin rashes (+) Mild hand/foot syndrome (+) Old skin rash of forearms  Lymphatics: Denies new lymphadenopathy or easy bruising Neurological:Denies numbness, tingling or new weaknesses Behavioral/Psych: Mood is stable, no new changes  All other systems were reviewed with the patient and are negative.  MEDICAL HISTORY:  Past Medical History:  Diagnosis Date  . Adenocarcinoma, colon ([place] dx[place] 01/2018  . Anemia    taking iron supplements  . Anxiety   . At[place]   . Colonic obstruction ([place] 01/10/2018  . Depression   . Diverticulitis   . Dysrhythmia    afib  . History of kidney stones   . Hypertension     SURGICAL HISTORY: Past Surgical History:   Procedure Laterality Date  . ANKLE SURGERY Left    when he was in college  . COLON RESECTION N/A 01/11/2018   Procedure: LEFT COLON RESECTION, TAKEDOWN SPLENIC FLEXURE, COLOSTOMY;  Surgeon: IFanny Skates MD;  Location: [facility];  Service: General;  Laterality: N/A;  . COLONOSCOPY  01/11/2018   Procedure: COLONOSCOPY;  Surgeon: GJackquline Denmark MD;  Location: [facility];  Service: Endoscopy;;  . COLONOSCOPY  05/10/2018  . colonscopy  05/10/2018  . HERNIA REPAIR    . HERNIA REPAIR  03/02/2019   EXPLORATORY LAPAROTOMY (N/A Abdomen)  . INCISIONAL HERNIA REPAIR N/A 03/02/2019   Procedure: INCISIONAL HERNIA REPAIR , RECTORECTUS VS TAR HERNIA REPAIR;  Surgeon: RRalene Ok MD;  [facility] [facility]  Service: General;  Laterality: N/A;  . INSERTION OF MESH N/A 03/02/2019   Procedure: Insertion Of Mesh;  Surgeon: RRalene Ok MD;  [facility] [facility]  Service: General;  Laterality: N/A;  . LAPAROTOMY N/A 03/02/2019   Procedure: EXPLORATORY LAPAROTOMY;  Surgeon: RRalene Ok MD;  [facility]  Service: General;  Laterality: N/A;  . LYSIS OF ADHESION N/A 06/12/2018   Procedure: LYSIS OF ADHESIONS;  Surgeon: GMichael Boston MD;  Location: [facility];  Service: General;  Laterality: N/A;  . LYSIS OF ADHESION N/A 03/02/2019   Procedure: Lysis Of Adhesion;  Surgeon: RRalene Ok MD;  [facility] [facility]  Service: General;  Laterality: N/A;  . PORTACATH PLACEMENT Right 03/07/2018   Procedure: INSERTION PORT-A-CATH RIGHT SUBCLAVIAN;  Surgeon: IFanny Skates MD;  [place] [facility]  Service: General;  Laterality: Right;  . PROCTOSCOPY N/A 06/12/2018   Procedure: RIGID PROCTOSCOPY;  Surgeon: GMichael Boston MD;  Location: [facility];  Service: General;  Laterality: N/A;  . thumb surgery  2018   cyst removal    I have reviewed the social history and family history with the patient and they are unchanged from previous note.  ALLERGIES:  has No Known Allergies.  MEDICATIONS:  Current Outpatient Medications   Medication Sig Dispense Refill  . apixaban (ELIQUIS) 5 MG TABS tablet Take 1 tablet (5 mg total) by mouth 2 (two) times daily. 60 tablet 0  . b complex vitamins tablet Take 1 tablet by mouth daily.    . capecitabine (XELODA) 500 MG tablet Take 3 tablets (2000 mg total) by mouth every 12 hours. Take within 30 minutes of AM and PM meals. Take for 14 days on, then 7 days off. Repeat every 21 days. 84 tablet 1  . diltiazem (CARDIZEM CD) 360 MG 24 hr capsule Take 1 capsule (360 mg total) by mouth daily. 90 capsule 3  . diphenoxylate-atropine (LOMOTIL) 2.5-0.025 MG tablet Take 1-2 tablets by mouth 4 (four) times daily as needed for diarrhea or loose stools. 60 tablet 1  . docusate sodium (COLACE) 100 MG capsule Take 100 mg by mouth 2 (two) times daily.    [name] levothyroxine (SYNTHROID) 75 MCG tablet Take 1 tablet (75 mcg total) by mouth daily. 90 tablet 1  . lidocaine-prilocaine (EMLA) cream Apply 1 application topically as needed. 30 g 1  . Multiple Vitamins-Iron (MULTIVITAMIN/IRON PO) Take 1 tablet by mouth daily.     . prochlorperazine (COMPAZINE) 10 MG tablet Take 1 tablet (10 mg total) by mouth every 6 (six) hours as needed for nausea or vomiting. 30 tablet 0  . tamsulosin (FLOMAX) 0.4 MG CAPS capsule Take 0.4 mg by mouth 2 (two) times daily.     . urea (CARMOL) 10 % cream Apply topically as needed. 71 g 0   No current facility-administered medications for this visit.    PHYSICAL EXAMINATION: ECOG PERFORMANCE STATUS: 1 - Symptomatic but completely ambulatory  Vitals:   08/04/20 0854  BP: 135/61  Pulse: 84  Resp: 14  Temp: 98 F (36.7 C)  SpO2: 99%   Filed Weights   08/04/20 0854  Weight: 232 lb 9.6 oz (105.5 kg)    Due to COVID19 we will limit examination to appearance. Patient had no complaints.  GENERAL:alert, no distress and comfortable SKIN: skin color norma (+) skin darkening of hands and mild dry skin rash of forearms EYES: normal, Conjunctiva are pink and non-injected,  sclera clear  NEURO: alert & oriented x 3 with fluent speech   LABORATORY DATA:  I have reviewed the data as listed CBC Latest Ref Rng & Units 08/04/2020 07/14/2020 06/27/2020  WBC 4.0 - 10.5 K/uL 5.9 5.7 4.8  Hemoglobin 13.0 - 17.0 g/dL 14.2 13.5 12.8(L)  Hematocrit 39.0 - 52.0 % 41.9 38.5(L) 38.2(L)  Platelets 150 - 400 K/uL 198 184 159     CMP Latest Ref Rng & Units 07/14/2020 06/27/2020 06/02/2020  Glucose 70 - 99 mg/dL 96 110(H) 138(H)  BUN 8 - 23 mg/dL 21 24(H) 22  Creatinine 0.61 - 1.24 mg/dL 1.22 1.17 1.40(H)  Sodium 135 - 145 mmol/L 139 142 140  Potassium 3.5 - 5.1 mmol/L 3.9 4.0 3.7  Chloride 98 - 111 mmol/L 109 108 103  CO2 22 - 32 mmol/L _0 Calcium 8.9 - 10.3 mg/dL 8.9 8.7(L) 9.1  Total Protein 6.5 - 8.1 g/dL 6.7 6.8 6.8  Total Bilirubin 0.3 - 1.2 mg/dL 0.9 0.9 0.8  Alkaline Phos 38 - 126 U/L 82 70 70  AST 15 -  41 U/L _0 ALT 0 - 44 U/L _1 RADIOGRAPHIC STUDIES: I have personally reviewed the radiological images as listed and agreed with the findings in the report. No results found.   ASSESSMENT & PLAN:  Dave Vaughn is a 75 y.o. male with   1. Cancer of left colon,adenocarcinoma, stage IIIB(pT3N1cM0), MSI-stable, liver and peritoneal recurrence 12/2019 -Diagnosed in 01/2018. Treated with surgery andadjuvantFOLFOX. Due to side effects Oxaliplatin was stopped after 3 cycles and chemo was stopped after 6 cycles. He declined completing 6 months of chemo treatment. -Unfortunately he had local recurrence in left abdominal wall withperitoneal metastasis,Two hypermetabolic hepatic metastasisin 12/2019.HisUS Biopsyof abdominal wallfrom8/2021 confirmed metastaticcolorectal adenocarcinoma. -His 01/15/20 FO results showKras mutation, MSS, he is not a candidate for EGFR inhibitor or immunotherapy -I started him on first-line FOLFIRI q2weeks on 01/28/20.Bevacizumab (MVASI)was added with C2.He completed 8 cycles before switching to  maintenance Xeloda 2095m BID 2 weeks on/1 week off and Bevacizumab on 06/02/20.  -He continues to tolerate Xeloda well with mild toxicities of hand-foot syndrome and skin darkening. Labs reviewed, and adequate to proceed with C4 Bevacizumab and start Xeloda 15083mBID today.  -Plan for scan March 17 or 18.  -F/u in 3 weeks.   2. Comorbidities: Atrial Fibrillation, CKD stage III, BPH, Elevated TSH  -continue Eliquis. Continue follow-up with cardiologyDr HaLyla SonAlso continue to Followed with urologist Dr. WiLovena NeighboursPelvic MRI on 7/1 shows marked prostatomegaly with signs of BPH extending into the bladder base -TSH on 02/15/20 at 91.79.He is on Synthroid since 02/16/20.Will continue to monitor.  3.Goal of care discussion  -The patient understands the goal of care is palliative. -He is full code now  4. Skin toxicity, secondary to Xeloda   -After C2 Xeloda he developed moderate skin rash of his forearms and sensitivity of his feet.  -Will reduce Xeloda dose. I encouraged him to use topical hydrocortisone cream and possibly Urea cream if needed. He can use this on his hands as well.  -I recommend he use mouthwash daily to help prevent mouth sores.    Plan -Labs reviewed and adequate to proceed with C4 Bevecizumab today  -Start C5 Xeloda today at 150073mID today, for 2 weeks on and one week off. -Lab, flush, F/u and Bevacizumab in 3 and 6  -CT CAP wo contrast on March 17 or 18. Pt will call radiology to schedule. I provided him with number.    No problem-specific Assessment & Plan notes found for this encounter.   No orders of the defined types were placed in this encounter.  All questions were answered. The patient knows to call the clinic with any problems, questions or concerns. No barriers to learning was detected. The total time spent in the appointment was 30 minutes.     YanTruitt MerleD 08/04/2020   I, AmoJoslyn Devonm acting as scribe for YanTruitt MerleD.   I have  reviewed the above documentation for accuracy and completeness, and I agree with the above.

## 2020-08-04 ENCOUNTER — Inpatient Hospital Stay (HOSPITAL_BASED_OUTPATIENT_CLINIC_OR_DEPARTMENT_OTHER): Payer: Commercial Managed Care - PPO | Admitting: Hematology

## 2020-08-04 ENCOUNTER — Other Ambulatory Visit: Payer: Self-pay

## 2020-08-04 ENCOUNTER — Inpatient Hospital Stay: Payer: Commercial Managed Care - PPO

## 2020-08-04 ENCOUNTER — Telehealth: Payer: Self-pay | Admitting: Hematology

## 2020-08-04 ENCOUNTER — Encounter: Payer: Self-pay | Admitting: Hematology

## 2020-08-04 VITALS — BP 135/61 | HR 84 | Temp 98.0°F | Resp 14 | Ht 71.0 in | Wt 232.6 lb

## 2020-08-04 DIAGNOSIS — C186 Malignant neoplasm of descending colon: Secondary | ICD-10-CM

## 2020-08-04 DIAGNOSIS — Z95828 Presence of other vascular implants and grafts: Secondary | ICD-10-CM

## 2020-08-04 DIAGNOSIS — Z5112 Encounter for antineoplastic immunotherapy: Secondary | ICD-10-CM | POA: Diagnosis not present

## 2020-08-04 LAB — CMP (CANCER CENTER ONLY)
ALT: 12 U/L (ref 0–44)
AST: 17 U/L (ref 15–41)
Albumin: 4 g/dL (ref 3.5–5.0)
Alkaline Phosphatase: 76 U/L (ref 38–126)
Anion gap: 6 (ref 5–15)
BUN: 22 mg/dL (ref 8–23)
CO2: 24 mmol/L (ref 22–32)
Calcium: 9 mg/dL (ref 8.9–10.3)
Chloride: 109 mmol/L (ref 98–111)
Creatinine: 1.27 mg/dL — ABNORMAL HIGH (ref 0.61–1.24)
GFR, Estimated: 60 mL/min — ABNORMAL LOW (ref >60)
Glucose, Bld: 102 mg/dL — ABNORMAL HIGH (ref 70–99)
Potassium: 4.1 mmol/L (ref 3.5–5.1)
Sodium: 139 mmol/L (ref 135–145)
Total Bilirubin: 0.7 mg/dL (ref 0.3–1.2)
Total Protein: 6.9 g/dL (ref 6.5–8.1)

## 2020-08-04 LAB — CBC WITH DIFFERENTIAL (CANCER CENTER ONLY)
Abs Immature Granulocytes: 0.01 10*3/uL (ref 0.00–0.07)
Basophils Absolute: 0 10*3/uL (ref 0.0–0.1)
Basophils Relative: 1 %
Eosinophils Absolute: 0.2 10*3/uL (ref 0.0–0.5)
Eosinophils Relative: 4 %
HCT: 41.9 % (ref 39.0–52.0)
Hemoglobin: 14.2 g/dL (ref 13.0–17.0)
Immature Granulocytes: 0 %
Lymphocytes Relative: 29 %
Lymphs Abs: 1.7 10*3/uL (ref 0.7–4.0)
MCH: 33.6 pg (ref 26.0–34.0)
MCHC: 33.9 g/dL (ref 30.0–36.0)
MCV: 99.3 fL (ref 80.0–100.0)
Monocytes Absolute: 0.6 10*3/uL (ref 0.1–1.0)
Monocytes Relative: 10 %
Neutro Abs: 3.4 10*3/uL (ref 1.7–7.7)
Neutrophils Relative %: 56 %
Platelet Count: 198 10*3/uL (ref 150–400)
RBC: 4.22 MIL/uL (ref 4.22–5.81)
RDW: 14.9 % (ref 11.5–15.5)
WBC Count: 5.9 10*3/uL (ref 4.0–10.5)
nRBC: 0 % (ref 0.0–0.2)

## 2020-08-04 LAB — CEA (IN HOUSE-CHCC): CEA (CHCC-In House): 3.99 ng/mL (ref 0.00–5.00)

## 2020-08-04 LAB — TOTAL PROTEIN, URINE DIPSTICK: Protein, ur: 30 mg/dL — AB

## 2020-08-04 MED ORDER — SODIUM CHLORIDE 0.9% FLUSH
10.0000 mL | INTRAVENOUS | Status: DC | PRN
  Administered 2020-08-04: 10 mL
  Filled 2020-08-04: qty 10

## 2020-08-04 MED ORDER — SODIUM CHLORIDE 0.9 % IV SOLN
7.5000 mg/kg | Freq: Once | INTRAVENOUS | Status: AC
  Administered 2020-08-04: 800 mg via INTRAVENOUS
  Filled 2020-08-04: qty 32

## 2020-08-04 MED ORDER — ALTEPLASE 2 MG IJ SOLR
INTRAMUSCULAR | Status: AC
  Filled 2020-08-04: qty 2

## 2020-08-04 MED ORDER — HEPARIN SOD (PORK) LOCK FLUSH 100 UNIT/ML IV SOLN
500.0000 [IU] | Freq: Once | INTRAVENOUS | Status: AC | PRN
  Administered 2020-08-04: 500 [IU]
  Filled 2020-08-04: qty 5

## 2020-08-04 MED ORDER — SODIUM CHLORIDE 0.9 % IV SOLN
Freq: Once | INTRAVENOUS | Status: DC
  Filled 2020-08-04: qty 250

## 2020-08-04 MED ORDER — CAPECITABINE 500 MG PO TABS: ORAL_TABLET | ORAL | 1 refills | Status: DC

## 2020-08-04 MED ORDER — ALTEPLASE 2 MG IJ SOLR
2.0000 mg | Freq: Once | INTRAMUSCULAR | Status: AC | PRN
  Administered 2020-08-04: 2 mg
  Filled 2020-08-04: qty 2

## 2020-08-04 NOTE — Telephone Encounter (Signed)
Checked out appointment. No LOS notes needing to be scheduled. No changes made. 

## 2020-08-04 NOTE — Patient Instructions (Signed)
[facility] Discharge Instructions for Patients Receiving Chemotherapy  Today you received the following chemotherapy agents: bevacizumab  To help prevent nausea and vomiting after your treatment, we encourage you to take your nausea medication as directed.   If you develop nausea and vomiting that is not controlled by your nausea medication, call the clinic.   BELOW ARE SYMPTOMS THAT SHOULD BE REPORTED IMMEDIATELY:  *FEVER GREATER THAN 100.5 F  *CHILLS WITH OR WITHOUT FEVER  NAUSEA AND VOMITING THAT IS NOT CONTROLLED WITH YOUR NAUSEA MEDICATION  *UNUSUAL SHORTNESS OF BREATH  *UNUSUAL BRUISING OR BLEEDING  TENDERNESS IN MOUTH AND THROAT WITH OR WITHOUT PRESENCE OF ULCERS  *URINARY PROBLEMS  *BOWEL PROBLEMS  UNUSUAL RASH Items with * indicate a potential emergency and should be followed up as soon as possible.  Feel free to call the clinic should you have any questions or concerns. The clinic phone number is [phone].  Please show the CHEMO ALERT CARD at check-in to the Emergency Department and triage nurse.   

## 2020-08-07 ENCOUNTER — Other Ambulatory Visit: Payer: Self-pay

## 2020-08-07 DIAGNOSIS — C186 Malignant neoplasm of descending colon: Secondary | ICD-10-CM

## 2020-08-07 MED ORDER — CAPECITABINE 500 MG PO TABS: ORAL_TABLET | ORAL | 1 refills | Status: DC

## 2020-08-15 ENCOUNTER — Other Ambulatory Visit: Payer: Self-pay

## 2020-08-15 ENCOUNTER — Ambulatory Visit (HOSPITAL_COMMUNITY)
Admission: RE | Admit: 2020-08-15 | Discharge: 2020-08-15 | Disposition: A | Discharge status: Home / Self Care | Payer: Commercial Managed Care - PPO | Source: Ambulatory Visit | Attending: Hematology | Admitting: Hematology

## 2020-08-15 DIAGNOSIS — C186 Malignant neoplasm of descending colon: Secondary | ICD-10-CM | POA: Diagnosis not present

## 2020-08-15 MED ORDER — IOHEXOL 300 MG/ML  SOLN
100.0000 mL | Freq: Once | INTRAMUSCULAR | Status: AC | PRN
  Administered 2020-08-15: 100 mL via INTRAVENOUS

## 2020-08-18 ENCOUNTER — Telehealth: Payer: Self-pay | Admitting: Hematology

## 2020-08-18 ENCOUNTER — Telehealth: Payer: Self-pay | Admitting: *Deleted

## 2020-08-18 NOTE — Telephone Encounter (Signed)
-----   Message from [name], MD sent at 08/17/2020  1:33 PM EDT ----- Please let pt know his CT scan result. He has had excellent response to chemo. He is scheduled to see me on 3/21, will review scan then, unless he has questions, then I can do a virtual visit before his next office visit.   [name]  08/17/2020

## 2020-08-18 NOTE — Telephone Encounter (Signed)
Called patient regarding 03/14 scheduled message, left a voicemail to reschedule appointments.

## 2020-08-18 NOTE — Telephone Encounter (Signed)
Notified of message below

## 2020-08-22 NOTE — Progress Notes (Signed)
[place]   [phone] [phone]) [phone]   Clinic Follow up Note   Patient Care Team: [name], MD as PCP - General (Family Medicine) [name], MD as Consulting Physician (Cardiology) [name], MD as Consulting Physician (Gastroenterology) [name], MD as Consulting Physician (General Surgery) [name], MD as Consulting Physician (General Surgery) [name], MD as Consulting Physician (Urology) [name], MD as Consulting Physician (Medical Oncology)  Date of Service:  08/25/2020  CHIEF COMPLAINT:  F/u of colon cancer  SUMMARY OF ONCOLOGIC HISTORY: Oncology History Overview Note  Cancer Staging Cancer of left colon [facility]) Staging form: Colon and Rectum, AJCC 8th Edition - Pathologic stage from 01/11/2018: Stage IIIB (pT3, pN1c, cM0) - Signed by [name], MD on 01/16/2018     Cancer of left colon ([place])  01/10/2018 Imaging   CT AP W Contrast 01/10/18  IMPRESSION: Irregular soft tissue density causing stricture of the mid descending colon likely the site of obstruction for the dilated small bowel. 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 No evidence of perforation.  Equivocal findings involving the appendix measuring 1.2 cm at the appendiceal tip with mucosal enhancement. No adjacent free fluid or inflammatory change. Findings are nonspecific, but can be seen with early acute appendicitis.  Mild prostatic enlargement. Increased density over the posterior bladder base likely due to the large prostatic impression although cannot completely exclude a bladder mass. Urology protocol CT or ultrasound may be helpful for better evaluation.  Mild cholelithiasis.  Stable 1.5 cm cystic structure over the lower pole right kidney likely slightly hyperdense cyst.  Diverticulosis of the colon.  Aortic Atherosclerosis (ICD10-I70.0).     01/11/2018 Cancer Staging   Staging form: Colon and Rectum, AJCC  8th Edition - Pathologic stage from 01/11/2018: Stage IIIB (pT3, pN1c, cM0) - Signed by [name], MD on 01/16/2018   01/11/2018 Surgery   LEFT COLON RESECTION, TAKEDOWN SPLENIC FLEXURE, COLOSTOMY by Dr. [name]    01/11/2018 Procedure   Colonoscopy 01/11/18 by Dr. [name]  - Malignant completely obstructing tumor in the mid descending colon. Tattooed. - Diverticulosis in the sigmoid colon. - Non-bleeding internal hemorrhoids. - No specimens collected.   01/11/2018 Pathology Results   Diagnosis 01/11/18  1. Colon, segmental resection for tumor, descending colon - INVASIVE COLORECTAL ADENOCARCINOMA, 4 CM. - TUMOR EXTENDS INTO PERICOLONIC CONNECTIVE TISSUE. - TUMOR FOCALLY INVOLVES RADIAL MARGIN. - ONE MESENTERIC TUMOR DEPOSIT. - THIRTEEN BENIGN LYMPH NODES (0/13). 2. Colon, segmental resection, splenic flexure - BENIGN COLON. - NO EVIDENCE OF MALIGNANCY .   01/11/2018 Tumor Marker   Baseline CEA at 3.4   01/16/2018 Initial Diagnosis   Cancer of left colon ([place])   01/23/2018 Imaging   CT CHEST WO CONTRAST IMPRESSION: 1. No evidence for metastatic disease within the chest. 2. Small left pleural effusion with underlying opacities which may represent atelectasis. Right basilar atelectasis. 3. Few foci of gas within the upper abdomen in the omentum with surrounding fat stranding, likely postsurgical 4. Aortic Atherosclerosis (ICD10-I70.0).   03/08/2018 - 05/15/2018 Chemotherapy   adjuvant FOLOFX. Due to side effects of neuropahty Oxaliplatin was stopped after 3 cycles and chemo was stopped after 6 cycles. He declined completing 6 months of chemo treatment.     03/19/2018 Imaging   03/19/2018 CT AP IMPRESSION: 1. Interval partial left hemicolectomy and descending colostomy. 2. Heterogeneous soft tissue density along the left anterior renal fascia is most likely postoperative (favor fat necrosis). No well-defined fluid collection. 3. Mild left lower  quadrant edema, new since 01/10/2018. This  could be postoperative. Superimposed sigmoid diverticulitis and/or cystitis cannot be excluded. 4. Subtle hyperenhancing nodule within the anterior bladder dome cannot be excluded. Consider nonemergent cystoscopy. When this is performed, recommend attention to the left ureterovesicular junction and distal left ureter to evaluate questionable soft tissue fullness. 5. Cholelithiasis. 6.  Aortic Atherosclerosis (ICD10-I70.0). 7. Prostatomegaly.   11/13/2018 Imaging   CT CAP WO Contrast 11/13/18  IMPRESSION: 1. Reversal of left lower quadrant colostomy with sigmoid colon anastomosis. No complicating features. No findings for residual or recurrent tumor or metastatic disease involving the chest, abdomen or pelvis without contrast. 2. No acute abdominal/pelvic findings. 3. Gallbladder sludge and gallstones but no findings for acute cholecystitis. 4. Stable anterior abdominal wall hernia. 5. The right testicle is in the right inguinal canal.   10/03/2019 Imaging   CT CAP WO contrast  IMPRESSION: 1. New rounded density interposed between the prostate gland and anterior upper rectal wall could represent adenopathy or local extension of anterior rectal tumor. 2. Marked prostatomegaly, prostate volume 150 cubic cm. 3. Other imaging findings of potential clinical significance: Aortic Atherosclerosis (ICD10-I70.0). Coronary atherosclerosis. Trace right pleural effusion. Suspected cholelithiasis. Nonobstructive left nephrolithiasis. Multilevel lumbar impingement. Bilateral mildly retracted testicles. Hypodense exophytic lesion of the right kidney, most likely to be a cyst.   11/02/2019 Procedure   colonoscopy on 11/02/2019 by Dr. [name] Plan showed normal digital rectal exam, 3 polyps in the rectum, descending colon and cecum, and a prior sigmoid: Anastomosis characterized by erythema.  He found an extrinsic nonobstructing medium-sized mass in the proximal rectum about 4 cm in length, no internal  rectal mass.   Diagnosis Surgical [P], colon, cecum, descending, rectal, polyp (3) - TUBULAR ADENOMA (TWO) - NO HIGH GRADE DYSPLASIA OR CARCINOMA. - COLONIC FRAGMENT WITH BENIGN LYMPHOID AGGREGATE.   12/07/2019 Imaging   MRI pelvis IMPRESSION: 1. Masslike area in the rectum suspicious for rectal neoplasm, likely T4b based on the appearance of soft tissue extending along the anterior peritoneal reflection and into the seminal vesicles. Correlation with recent colonoscopy results may be helpful. Area of anastomosis and other areas of the pelvis are not imaged on today's exam.   12/21/2019 PET scan   IMPRESSION: 1. Unfortunately evidence for peritoneal metastasis. Intensely hypermetabolic nodules along the LEFT pericolic gutter. Favor hypermetabolic mass anterior to the rectum to represent serosal implant along the ventral surface of the rectum. 2. local recurrence within the LEFT abdominal wall at site prior colostomy. Intense hypermetabolic thickening of the rectus muscle at this site. 3. Two hypermetabolic hepatic metastasis.   01/15/2020 Relapse/Recurrence   FINAL MICROSCOPIC DIAGNOSIS:   A. SOFT TISSUE, LEFT ABDOMINAL WALL, BIOPSY:  - Adenocarcinoma.  - See comment.   COMMENT:   The morphology is consistent with metastatic colorectal adenocarcinoma.    01/28/2020 -  Chemotherapy   First line FOLFIRI q2weeks starting 01/28/20 for 8 cycles.  -----Bevacizumab added with C2.  -----Changed to maintenance Xeloda 2000 mg twice daily for 2 weeks on/1 week off and bevacizumab 06/02/20     05/05/2020 Imaging   IMPRESSION: 1. Improved appearance, with reduced size of the hepatic metastatic lesions and reduced size of the peritoneal tumor implants. 2. Other imaging findings of potential clinical significance: Notable prostatomegaly. Multilevel impingement in the lumbar spine. Dependent density in the gallbladder possibly from sludge or gallstones. Degenerative glenohumeral  arthropathy bilaterally. 3. Aortic atherosclerosis.   08/15/2020 Imaging   CT CAP  IMPRESSION: Chest Impression:   No evidence of thoracic metastasis  Abdomen / Pelvis Impression:   1. Hepatic metastasis are no longer measurable by CT imaging. 2. Peritoneal nodular metastasis in the LEFT abdomen are decreased in size. 3. No evidence of new peritoneal disease. 4. Thickening and LEFT rectus muscle at site of prior metastasis. No interval change. 5. No evidence of new or progressive colorectal carcinoma.        CURRENT THERAPY:  First line FOLFIRI q2weeks starting 01/28/20 for 8 cycles.Bevacizumabadded with C2.Changedto maintenance Xeloda 2000 mg twice daily for 2 weeks on/1 week off and bevacizumab 06/02/20. C3 dose reduce to 1548m BID due to skin toxicity.   INTERVAL HISTORY:  Dave HACKMANis here for a follow up. He presents to the clinic with his wife. He notes he is doing well. He notes he has b/l LE edema. He notes he remain active. He does have redness of hands from hand-foot syndrome. The peeling of his feet improved after pedicure and use of cream. He denies fatigue.  [place]     REVIEW OF SYSTEMS:   Constitutional: Denies fevers, chills or abnormal weight loss Eyes: Denies blurriness of vision Ears, nose, mouth, throat, and face: Denies mucositis or sore throat Respiratory: Denies cough, dyspnea or wheezes Cardiovascular: Denies palpitation, chest discomfort or lower extremity swelling Gastrointestinal:  Denies nausea, heartburn or change in bowel habits Skin: Denies abnormal skin rashes (+) Hand redness  Lymphatics: Denies new lymphadenopathy or easy bruising Neurological:Denies numbness, tingling or new weaknesses Behavioral/Psych: Mood is stable, no new changes  All other systems were reviewed with the patient and are negative.  MEDICAL HISTORY:  Past Medical History:  Diagnosis Date  .  Adenocarcinoma, colon [place] dx[place] 01/[place]  . Anemia    taking iron supplements  . Anxiety   . Atrial [place] with RVR ([place]   . Colonic obstruction ([place] 01/10/2018  . Depression   . Diverticulitis   . Dysrhythmia    afib  . History of kidney stones   . Hypertension     SURGICAL HISTORY: Past Surgical History:  Procedure Laterality Date  . ANKLE SURGERY Left    when he was in college  . COLON RESECTION N/A 01/11/2018   Procedure: LEFT COLON RESECTION, TAKEDOWN SPLENIC FLEXURE, COLOSTOMY;  Surgeon: IFanny Skates MD;  Location: [facility];  Service: General;  Laterality: N/A;  . COLONOSCOPY  01/11/2018   Procedure: COLONOSCOPY;  Surgeon: GJackquline Denmark MD;  Location: [facility];  Service: Endoscopy;;  . COLONOSCOPY  05/10/2018  . colonscopy  05/10/2018  . HERNIA REPAIR    . HERNIA REPAIR  03/02/2019   EXPLORATORY LAPAROTOMY (N/A Abdomen)  . INCISIONAL HERNIA REPAIR N/A 03/02/2019   Procedure: INCISIONAL HERNIA REPAIR , RECTORECTUS VS TAR HERNIA REPAIR;  Surgeon: RRalene Ok MD;  [place]  Service: General;  Laterality: N/A;  . INSERTION OF MESH N/A 03/02/2019   Procedure: Insertion Of Mesh;  Surgeon: RRalene Ok MD;  [place]  Service: General;  Laterality: N/A;  . LAPAROTOMY N/A 03/02/2019   Procedure: EXPLORATORY LAPAROTOMY;  Surgeon: RRalene Ok MD;  [place]  Service: General;  Laterality: N/A;  . LYSIS OF ADHESION N/A 06/12/2018   Procedure: LYSIS OF ADHESIONS;  Surgeon: GMichael Boston MD;  [facility];  Service: General;  Laterality: N/A;  . LYSIS OF ADHESION N/A 03/02/2019   Procedure: Lysis Of Adhesion;  Surgeon: RRalene Ok MD;  [place]  Service: General;  Laterality: N/A;  . PORTACATH PLACEMENT Right 03/07/2018   Procedure: INSERTION PORT-A-CATH RIGHT SUBCLAVIAN;  Surgeon: [name], MD;  Location: [place];  Service: General;  Laterality: Right;  . PROCTOSCOPY N/A 06/12/2018   Procedure: RIGID PROCTOSCOPY;  Surgeon:  [name], MD;  Location: [facility];  Service: General;  Laterality: N/A;  . thumb surgery   2018   cyst removal    I have reviewed the social history and family history with the patient and they are unchanged from previous note.  ALLERGIES:  has No Known Allergies.  MEDICATIONS:  Current Outpatient Medications  Medication Sig Dispense Refill  . apixaban (ELIQUIS) 5 MG TABS tablet Take 1 tablet (5 mg total) by mouth 2 (two) times daily. 60 tablet 0  . b complex vitamins tablet Take 1 tablet by mouth daily.    . capecitabine (XELODA) 500 MG tablet Take 3 tablets (1500 mg total) by mouth every 12 hours. Take within 30 minutes of AM and PM meals. Take for 14 days on, then 7 days off. Repeat every 21 days. 84 tablet 1  . diltiazem (CARDIZEM CD) 360 MG 24 hr capsule Take 1 capsule (360 mg total) by mouth daily. 90 capsule 3  . diphenoxylate-atropine (LOMOTIL) 2.5-0.025 MG tablet Take 1-2 tablets by mouth 4 (four) times daily as needed for diarrhea or loose stools. 60 tablet 1  . docusate sodium (COLACE) 100 MG capsule Take 100 mg by mouth 2 (two) times daily.    [name] levothyroxine (SYNTHROID) 75 MCG tablet Take 1 tablet (75 mcg total) by mouth daily. 90 tablet 1  . lidocaine-prilocaine (EMLA) cream Apply 1 application topically as needed. 30 g 1  . Multiple Vitamins-Iron (MULTIVITAMIN/IRON PO) Take 1 tablet by mouth daily.     . prochlorperazine (COMPAZINE) 10 MG tablet Take 1 tablet (10 mg total) by mouth every 6 (six) hours as needed for nausea or vomiting. 30 tablet 0  . tamsulosin (FLOMAX) 0.4 MG CAPS capsule Take 0.4 mg by mouth 2 (two) times daily.     . urea (CARMOL) 10 % cream Apply topically as needed. 71 g 0   No current facility-administered medications for this visit.   Facility-Administered Medications Ordered in Other Visits  Medication Dose Route Frequency Provider Last Rate Last Admin  . sodium chloride flush (NS) 0.9 % injection 10 mL  10 mL Intracatheter PRN [name], MD   10  mL at 08/25/20 1514    PHYSICAL EXAMINATION: ECOG PERFORMANCE STATUS: 1 - Symptomatic but completely ambulatory  Vitals:   08/25/20 1303  BP: 122/82  Pulse: 68  Resp: 18  Temp: (!) 97.4 F (36.3 C)  SpO2: 100%   Filed Weights   08/25/20 1303  Weight: 235 lb (106.6 kg)    GENERAL:alert, no distress and comfortable SKIN: skin color, texture, turgor are normal, no rashes or significant lesions (+) Skin erythema and darkening of hands.  EYES: normal, Conjunctiva are pink and non-injected, sclera clear  NECK: supple, thyroid normal size, non-tender, without nodularity LYMPH:  no palpable lymphadenopathy in the cervical, axillary  LUNGS: clear to auscultation and percussion with normal breathing effort HEART: regular rate & rhythm and no murmurs (+)  lower extremity edema ABDOMEN:abdomen soft, non-tender and normal bowel sounds  Musculoskeletal:no cyanosis of digits and no clubbing  NEURO: alert & oriented x 3 with fluent speech, no focal motor/sensory deficits  LABORATORY DATA:  I have reviewed the data as listed CBC Latest Ref Rng & Units 08/25/2020 08/04/2020 07/14/2020  WBC 4.0 - 10.5 K/uL 5.6 5.9 5.7  Hemoglobin 13.0 - 17.0 g/dL 13.6 14.2 13.5  Hematocrit 39.0 - 52.0 % 39.7 41.9 38.5(L)  Platelets 150 - 400 K/uL 173 198 184     CMP Latest Ref Rng & Units 08/25/2020 08/04/2020 07/14/2020  Glucose 70 - 99 mg/dL 94 102(H) 96  BUN 8 - 23 mg/dL 15 22 21   Creatinine 0.61 - 1.24 mg/dL 1.21 1.27(H) 1.22  Sodium 135 - 145 mmol/L 139 139 139  Potassium 3.5 - 5.1 mmol/L 3.9 4.1 3.9  Chloride 98 - 111 mmol/L 108 109 109  CO2 22 - 32 mmol/L 25 24 22   Calcium 8.9 - 10.3 mg/dL 8.9 9.0 8.9  Total Protein 6.5 - 8.1 g/dL 6.8 6.9 6.7  Total Bilirubin 0.3 - 1.2 mg/dL 0.9 0.7 0.9  Alkaline Phos 38 - 126 U/L 72 76 82  AST 15 - 41 U/L 20 17 21   ALT 0 - 44 U/L 16 12 18       RADIOGRAPHIC STUDIES: I have personally reviewed the radiological images as listed and agreed with the findings in  the report. No results found.   ASSESSMENT & PLAN:  Dave Vaughn is a 75 y.o. male with    1. Cancer of left colon,adenocarcinoma, stage IIIB(pT3N1cM0), MSI-stable, liver and peritoneal recurrence 12/2019 -Diagnosed in 01/2018. Treated with surgery andadjuvantFOLFOX. Due to side effects Oxaliplatin was stopped after 3 cycles and chemo was stopped after 6 cycles. He declined completing 6 months of chemo treatment. -Unfortunately he had local recurrence in left abdominal wall withperitoneal metastasis,Two hypermetabolic hepatic metastasisin 12/2019.HisUS Biopsyof abdominal wallfrom8/2021 confirmed metastaticcolorectal adenocarcinoma. -His 01/15/20 FO results showKras mutation, MSS, he is not a candidate for EGFR inhibitor or immunotherapy -I started him on first-line FOLFIRI q2weeks on 01/28/20.Bevacizumab (MVASI)was added with C2.He completed 8 cycles before switching to maintenance Xeloda 2041m BID 2 weeks on/1 week off and Bevacizumab on 06/02/20. -We discussed his CT CAP from 08/15/20 which shows Hepatic metastasis are no longer measurable, Peritoneal nodular metastasis in the LEFT abdomen are decreased in size and No evidence of new peritoneal disease.  I personally reviewed scan images and discussed with patient today. Overall he has good response, will continue treatment.  -He continues to tolerate treatment with mild skin toxicity. Labs reviewed, CBC and CMP WNL. Will proceed with Beva today.  -Will continue Xeloda 15060mBID 2 weeks on/1 week off. Start current cycle today  -F/u in 3 weeks  -[place]    2. Comorbidities: Atrial Fibrillation, CKD stage III, BPH, Elevated TSH  -continue Eliquis. Continue follow-up with cardiologyDr HaLyla SonAlso continue to Followed with urologist Dr. WiLovena NeighboursPelvic MRI on 7/1 shows marked prostatomegaly with signs of BPH extending into the bladder base -TSH on 02/15/20 at 91.79.He is on Synthroid since  02/16/20.Will continue to monitor.Stable.   3.Goal of care discussion  -The patient understands the goal of care is palliative. -He is full code now  4. Skin toxicity, secondary to Xeloda  -After C2 Xeloda he developed moderate skin rash of his forearms and sensitivity of his feet.  -More mild with Xeloda dose reduction. I encouraged him to use topical hydrocortisone cream and possibly Urea cream if needed.He can use this on his hands as well.  -I recommend he use mouthwash daily to help prevent mouth sores.    Plan -CT CAP reviewed, great partial response.  -Labs reviewed and adequate to proceed with Bevacizumab today  -Start Xeloda today  at 1530m BIDtoday, for 2 weeks on and one week off. -Lab, flush, F/u and Bevacizumab in 3 weeks    No problem-specific Assessment & Plan notes found for this encounter.   No orders of the defined types were placed in this encounter.  All questions were answered. The patient knows to call the clinic with any problems, questions or concerns. No barriers to learning was detected. The total time spent in the appointment was 30 minutes.     YTruitt Merle MD 08/25/2020   I, AJoslyn Devon am acting as scribe for YTruitt Merle MD.   I have reviewed the above documentation for accuracy and completeness, and I agree with the above.

## 2020-08-25 ENCOUNTER — Encounter: Payer: Self-pay | Admitting: Hematology

## 2020-08-25 ENCOUNTER — Inpatient Hospital Stay: Payer: Commercial Managed Care - PPO | Attending: Nurse Practitioner

## 2020-08-25 ENCOUNTER — Inpatient Hospital Stay (HOSPITAL_BASED_OUTPATIENT_CLINIC_OR_DEPARTMENT_OTHER): Payer: Commercial Managed Care - PPO | Admitting: Hematology

## 2020-08-25 ENCOUNTER — Inpatient Hospital Stay: Payer: Commercial Managed Care - PPO

## 2020-08-25 ENCOUNTER — Other Ambulatory Visit: Payer: Self-pay

## 2020-08-25 VITALS — BP 122/82 | HR 68 | Temp 97.4°F | Resp 18 | Ht 71.0 in | Wt 235.0 lb

## 2020-08-25 DIAGNOSIS — C186 Malignant neoplasm of descending colon: Secondary | ICD-10-CM | POA: Diagnosis not present

## 2020-08-25 DIAGNOSIS — Z5112 Encounter for antineoplastic immunotherapy: Secondary | ICD-10-CM | POA: Insufficient documentation

## 2020-08-25 DIAGNOSIS — C786 Secondary malignant neoplasm of retroperitoneum and peritoneum: Secondary | ICD-10-CM | POA: Diagnosis not present

## 2020-08-25 DIAGNOSIS — I129 Hypertensive chronic kidney disease with stage 1 through stage 4 chronic kidney disease, or unspecified chronic kidney disease: Secondary | ICD-10-CM | POA: Insufficient documentation

## 2020-08-25 DIAGNOSIS — C787 Secondary malignant neoplasm of liver and intrahepatic bile duct: Secondary | ICD-10-CM | POA: Diagnosis not present

## 2020-08-25 DIAGNOSIS — I4891 Unspecified atrial fibrillation: Secondary | ICD-10-CM | POA: Insufficient documentation

## 2020-08-25 DIAGNOSIS — N400 Enlarged prostate without lower urinary tract symptoms: Secondary | ICD-10-CM | POA: Insufficient documentation

## 2020-08-25 DIAGNOSIS — R946 Abnormal results of thyroid function studies: Secondary | ICD-10-CM | POA: Diagnosis not present

## 2020-08-25 DIAGNOSIS — N183 Chronic kidney disease, stage 3 unspecified: Secondary | ICD-10-CM | POA: Diagnosis not present

## 2020-08-25 DIAGNOSIS — Z95828 Presence of other vascular implants and grafts: Secondary | ICD-10-CM

## 2020-08-25 LAB — CBC WITH DIFFERENTIAL (CANCER CENTER ONLY)
Abs Immature Granulocytes: 0.01 10*3/uL (ref 0.00–0.07)
Basophils Absolute: 0 10*3/uL (ref 0.0–0.1)
Basophils Relative: 1 %
Eosinophils Absolute: 0.1 10*3/uL (ref 0.0–0.5)
Eosinophils Relative: 3 %
HCT: 39.7 % (ref 39.0–52.0)
Hemoglobin: 13.6 g/dL (ref 13.0–17.0)
Immature Granulocytes: 0 %
Lymphocytes Relative: 31 %
Lymphs Abs: 1.7 10*3/uL (ref 0.7–4.0)
MCH: 34.2 pg — ABNORMAL HIGH (ref 26.0–34.0)
MCHC: 34.3 g/dL (ref 30.0–36.0)
MCV: 99.7 fL (ref 80.0–100.0)
Monocytes Absolute: 0.5 10*3/uL (ref 0.1–1.0)
Monocytes Relative: 9 %
Neutro Abs: 3.2 10*3/uL (ref 1.7–7.7)
Neutrophils Relative %: 56 %
Platelet Count: 173 10*3/uL (ref 150–400)
RBC: 3.98 MIL/uL — ABNORMAL LOW (ref 4.22–5.81)
RDW: 15 % (ref 11.5–15.5)
WBC Count: 5.6 10*3/uL (ref 4.0–10.5)
nRBC: 0 % (ref 0.0–0.2)

## 2020-08-25 LAB — CMP (CANCER CENTER ONLY)
ALT: 16 U/L (ref 0–44)
AST: 20 U/L (ref 15–41)
Albumin: 4 g/dL (ref 3.5–5.0)
Alkaline Phosphatase: 72 U/L (ref 38–126)
Anion gap: 6 (ref 5–15)
BUN: 15 mg/dL (ref 8–23)
CO2: 25 mmol/L (ref 22–32)
Calcium: 8.9 mg/dL (ref 8.9–10.3)
Chloride: 108 mmol/L (ref 98–111)
Creatinine: 1.21 mg/dL (ref 0.61–1.24)
GFR, Estimated: >60 mL/min (ref >60)
Glucose, Bld: 94 mg/dL (ref 70–99)
Potassium: 3.9 mmol/L (ref 3.5–5.1)
Sodium: 139 mmol/L (ref 135–145)
Total Bilirubin: 0.9 mg/dL (ref 0.3–1.2)
Total Protein: 6.8 g/dL (ref 6.5–8.1)

## 2020-08-25 LAB — CEA (IN HOUSE-CHCC): CEA (CHCC-In House): 3.31 ng/mL (ref 0.00–5.00)

## 2020-08-25 MED ORDER — SODIUM CHLORIDE 0.9% FLUSH
10.0000 mL | INTRAVENOUS | Status: DC | PRN
  Administered 2020-08-25: 10 mL
  Filled 2020-08-25: qty 10

## 2020-08-25 MED ORDER — HEPARIN SOD (PORK) LOCK FLUSH 100 UNIT/ML IV SOLN
500.0000 [IU] | Freq: Once | INTRAVENOUS | Status: AC | PRN
  Administered 2020-08-25: 500 [IU]
  Filled 2020-08-25: qty 5

## 2020-08-25 MED ORDER — SODIUM CHLORIDE 0.9 % IV SOLN
Freq: Once | INTRAVENOUS | Status: AC
  Filled 2020-08-25: qty 250

## 2020-08-25 MED ORDER — SODIUM CHLORIDE 0.9 % IV SOLN
7.5000 mg/kg | Freq: Once | INTRAVENOUS | Status: AC
  Administered 2020-08-25: 800 mg via INTRAVENOUS
  Filled 2020-08-25: qty 32

## 2020-08-25 NOTE — Patient Instructions (Signed)
[facility] Discharge Instructions for Patients Receiving Chemotherapy  Today you received the following chemotherapy agents: bevacizumab  To help prevent nausea and vomiting after your treatment, we encourage you to take your nausea medication as directed.   If you develop nausea and vomiting that is not controlled by your nausea medication, call the clinic.   BELOW ARE SYMPTOMS THAT SHOULD BE REPORTED IMMEDIATELY:  *FEVER GREATER THAN 100.5 F  *CHILLS WITH OR WITHOUT FEVER  NAUSEA AND VOMITING THAT IS NOT CONTROLLED WITH YOUR NAUSEA MEDICATION  *UNUSUAL SHORTNESS OF BREATH  *UNUSUAL BRUISING OR BLEEDING  TENDERNESS IN MOUTH AND THROAT WITH OR WITHOUT PRESENCE OF ULCERS  *URINARY PROBLEMS  *BOWEL PROBLEMS  UNUSUAL RASH Items with * indicate a potential emergency and should be followed up as soon as possible.  Feel free to call the clinic should you have any questions or concerns. The clinic phone number is [phone].  Please show the CHEMO ALERT CARD at check-in to the Emergency Department and triage nurse.   

## 2020-08-25 NOTE — Patient Instructions (Signed)

## 2020-08-26 ENCOUNTER — Telehealth: Payer: Self-pay | Admitting: Hematology

## 2020-08-26 NOTE — Telephone Encounter (Signed)
Left message with follow-up appointment per 3/21 los. Gave option to call back to reschedule if needed. 

## 2020-09-12 NOTE — Progress Notes (Signed)
Dave Vaughn   [phone] [phone]   Clinic Follow up Note   Patient Care Team: [name], MD as PCP - General (Family Medicine) [name], MD as Consulting Physician (Cardiology) [name], MD as Consulting Physician (Gastroenterology) [name], MD as Consulting Physician (General Surgery) [name], MD as Consulting Physician (General Surgery) [name], MD as Consulting Physician (Urology) [name], MD as Consulting Physician (Medical Oncology)  Date of Service:  09/15/2020  CHIEF COMPLAINT: F/u of colon cancer  SUMMARY OF ONCOLOGIC HISTORY: Oncology History Overview Note  Cancer Staging Cancer of left colon [facility]) Staging form: Colon and Rectum, AJCC 8th Edition - Pathologic stage from 01/11/2018: Stage IIIB (pT3, pN1c, cM0) - Signed by [name], MD on 01/16/2018     Cancer of left colon ([place])  01/10/2018 Imaging   CT AP W Contrast 01/10/18  IMPRESSION: Irregular soft tissue density causing stricture of the mid descending colon likely the site of obstruction for the dilated small bowel. 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 No evidence of perforation.  Equivocal findings involving the appendix measuring 1.2 cm at the appendiceal tip with mucosal enhancement. No adjacent free fluid or inflammatory change. Findings are nonspecific, but can be seen with early acute appendicitis.  Mild prostatic enlargement. Increased density over the posterior bladder base likely due to the large prostatic impression although cannot completely exclude a bladder mass. Urology protocol CT or ultrasound may be helpful for better evaluation.  Mild cholelithiasis.  Stable 1.5 cm cystic structure over the lower pole right kidney likely slightly hyperdense cyst.  Diverticulosis of the colon.  Aortic Atherosclerosis (ICD10-I70.0).     01/11/2018 Cancer Staging   Staging form: Colon and Rectum, AJCC  8th Edition - Pathologic stage from 01/11/2018: Stage IIIB (pT3, pN1c, cM0) - Signed by [name], MD on 01/16/2018   01/11/2018 Surgery   LEFT COLON RESECTION, TAKEDOWN SPLENIC FLEXURE, COLOSTOMY by Dr. [name]    01/11/2018 Procedure   Colonoscopy 01/11/18 by Dr. [name]  - Malignant completely obstructing tumor in the mid descending colon. Tattooed. - Diverticulosis in the sigmoid colon. - Non-bleeding internal hemorrhoids. - No specimens collected.   01/11/2018 Pathology Results   Diagnosis 01/11/18  1. Colon, segmental resection for tumor, descending colon - INVASIVE COLORECTAL ADENOCARCINOMA, 4 CM. - TUMOR EXTENDS INTO PERICOLONIC CONNECTIVE TISSUE. - TUMOR FOCALLY INVOLVES RADIAL MARGIN. - ONE MESENTERIC TUMOR DEPOSIT. - THIRTEEN BENIGN LYMPH NODES (0/13). 2. Colon, segmental resection, splenic flexure - BENIGN COLON. - NO EVIDENCE OF MALIGNANCY .   01/11/2018 Tumor Marker   Baseline CEA at 3.4   01/16/2018 Initial Diagnosis   Cancer of left colon ([place])   01/23/2018 Imaging   CT CHEST WO CONTRAST IMPRESSION: 1. No evidence for metastatic disease within the chest. 2. Small left pleural effusion with underlying opacities which may represent atelectasis. Right basilar atelectasis. 3. Few foci of gas within the upper abdomen in the omentum with surrounding fat stranding, likely postsurgical 4. Aortic Atherosclerosis (ICD10-I70.0).   03/08/2018 - 05/15/2018 Chemotherapy   adjuvant FOLOFX. Due to side effects of neuropahty Oxaliplatin was stopped after 3 cycles and chemo was stopped after 6 cycles. He declined completing 6 months of chemo treatment.     03/19/2018 Imaging   03/19/2018 CT AP IMPRESSION: 1. Interval partial left hemicolectomy and descending colostomy. 2. Heterogeneous soft tissue density along the left anterior renal fascia is most likely postoperative (favor fat necrosis). No well-defined fluid collection. 3. Mild left lower quadrant  edema, new since 01/10/2018. This  could be postoperative. Superimposed sigmoid diverticulitis and/or cystitis cannot be excluded. 4. Subtle hyperenhancing nodule within the anterior bladder dome cannot be excluded. Consider nonemergent cystoscopy. When this is performed, recommend attention to the left ureterovesicular junction and distal left ureter to evaluate questionable soft tissue fullness. 5. Cholelithiasis. 6.  Aortic Atherosclerosis (ICD10-I70.0). 7. Prostatomegaly.   11/13/2018 Imaging   CT CAP WO Contrast 11/13/18  IMPRESSION: 1. Reversal of left lower quadrant colostomy with sigmoid colon anastomosis. No complicating features. No findings for residual or recurrent tumor or metastatic disease involving the chest, abdomen or pelvis without contrast. 2. No acute abdominal/pelvic findings. 3. Gallbladder sludge and gallstones but no findings for acute cholecystitis. 4. Stable anterior abdominal wall hernia. 5. The right testicle is in the right inguinal canal.   10/03/2019 Imaging   CT CAP WO contrast  IMPRESSION: 1. New rounded density interposed between the prostate gland and anterior upper rectal wall could represent adenopathy or local extension of anterior rectal tumor. 2. Marked prostatomegaly, prostate volume 150 cubic cm. 3. Other imaging findings of potential clinical significance: Aortic Atherosclerosis (ICD10-I70.0). Coronary atherosclerosis. Trace right pleural effusion. Suspected cholelithiasis. Nonobstructive left nephrolithiasis. Multilevel lumbar impingement. Bilateral mildly retracted testicles. Hypodense exophytic lesion of the right kidney, most likely to be a cyst.   11/02/2019 Procedure   colonoscopy on 11/02/2019 by Dr. [name] Plan showed normal digital rectal exam, 3 polyps in the rectum, descending colon and cecum, and a prior sigmoid: Anastomosis characterized by erythema.  He found an extrinsic nonobstructing medium-sized mass in the proximal rectum about 4 cm in length, no internal  rectal mass.   Diagnosis Surgical [P], colon, cecum, descending, rectal, polyp (3) - TUBULAR ADENOMA (TWO) - NO HIGH GRADE DYSPLASIA OR CARCINOMA. - COLONIC FRAGMENT WITH BENIGN LYMPHOID AGGREGATE.   12/07/2019 Imaging   MRI pelvis IMPRESSION: 1. Masslike area in the rectum suspicious for rectal neoplasm, likely T4b based on the appearance of soft tissue extending along the anterior peritoneal reflection and into the seminal vesicles. Correlation with recent colonoscopy results may be helpful. Area of anastomosis and other areas of the pelvis are not imaged on today's exam.   12/21/2019 PET scan   IMPRESSION: 1. Unfortunately evidence for peritoneal metastasis. Intensely hypermetabolic nodules along the LEFT pericolic gutter. Favor hypermetabolic mass anterior to the rectum to represent serosal implant along the ventral surface of the rectum. 2. local recurrence within the LEFT abdominal wall at site prior colostomy. Intense hypermetabolic thickening of the rectus muscle at this site. 3. Two hypermetabolic hepatic metastasis.   01/15/2020 Relapse/Recurrence   FINAL MICROSCOPIC DIAGNOSIS:   A. SOFT TISSUE, LEFT ABDOMINAL WALL, BIOPSY:  - Adenocarcinoma.  - See comment.   COMMENT:   The morphology is consistent with metastatic colorectal adenocarcinoma.    01/28/2020 -  Chemotherapy   First line FOLFIRI q2weeks starting 01/28/20 for 8 cycles.  -----Bevacizumab added with C2.  -----Changed to maintenance Xeloda 2000 mg twice daily for 2 weeks on/1 week off and bevacizumab 06/02/20     05/05/2020 Imaging   IMPRESSION: 1. Improved appearance, with reduced size of the hepatic metastatic lesions and reduced size of the peritoneal tumor implants. 2. Other imaging findings of potential clinical significance: Notable prostatomegaly. Multilevel impingement in the lumbar spine. Dependent density in the gallbladder possibly from sludge or gallstones. Degenerative glenohumeral  arthropathy bilaterally. 3. Aortic atherosclerosis.   08/15/2020 Imaging   CT CAP  IMPRESSION: Chest Impression:   No evidence of thoracic metastasis  Abdomen / Pelvis Impression:   1. Hepatic metastasis are no longer measurable by CT imaging. 2. Peritoneal nodular metastasis in the LEFT abdomen are decreased in size. 3. No evidence of new peritoneal disease. 4. Thickening and LEFT rectus muscle at site of prior metastasis. No interval change. 5. No evidence of new or progressive colorectal carcinoma.        CURRENT THERAPY:  First line FOLFIRI q2weeks starting 01/28/20 for 8 cycles.Bevacizumabadded with C2.Changedto maintenance Xeloda 2000 mg twice daily for 2 weeks on/1 week off and bevacizumab 06/02/20. C3 dose reduce to 1515m BID due to skin toxicity.  INTERVAL HISTORY:  Dave GUICEis here for a follow up. He was last seen by me 08/25/20. He presents to the clinic alone. He notes he is doing well. He notes he is tolerating xeloda well with mild skin toxicity. He notes his watch can check his pulse and blood pressure.     REVIEW OF SYSTEMS:   Constitutional: Denies fevers, chills or abnormal weight loss Eyes: Denies blurriness of vision Ears, nose, mouth, throat, and face: Denies mucositis or sore throat Respiratory: Denies cough, dyspnea or wheezes Cardiovascular: Denies palpitation, chest discomfort or lower extremity swelling Gastrointestinal:  Denies nausea, heartburn or change in bowel habits Skin: Denies abnormal skin rashes Lymphatics: Denies new lymphadenopathy or easy bruising Neurological:Denies numbness, tingling or new weaknesses Behavioral/Psych: Mood is stable, no new changes  All other systems were reviewed with the patient and are negative.  MEDICAL HISTORY:  Past Medical History:  Diagnosis Date  . [place]  . Anemia    taking iron supplements  . Anxiety   . [place]   . Col[place]  [place]struction [place] 01/10/2018  . Depression   . Diverticulitis   . Dysrhythmia    afib  . History of kidney stones   . Hypertension     SURGICAL HISTORY: Past Surgical History:  Procedure Laterality Date  . ANKLE SURGERY Left    when he was in college  . COLON RESECTION N/A 01/11/2018   Procedure: LEFT COLON RESECTION, TAKEDOWN SPLENIC FLEXURE, COLOSTOMY;  Surgeon: IFanny Skates MD;  Location: [facility];  Service: General;  Laterality: N/A;  . COLONOSCOPY  01/11/2018   Procedure: COLONOSCOPY;  Surgeon: GJackquline Denmark MD;  Location: [facility];  Service: Endoscopy;;  . COLONOSCOPY  05/10/2018  . colonscopy  05/10/2018  . HERNIA REPAIR    . HERNIA REPAIR  03/02/2019   EXPLORATORY LAPAROTOMY (N/A Abdomen)  . INCISIONAL HERNIA REPAIR N/A 03/02/2019   Procedure: INCISIONAL HERNIA REPAIR , RECTORECTUS VS TAR HERNIA REPAIR;  Surgeon: RRalene Ok MD;  [place] [facility]  Service: General;  Laterality: N/A;  . INSERTION OF MESH N/A 03/02/2019   Procedure: Insertion Of Mesh;  Surgeon: RRalene Ok MD;  [place] [facility]  Service: General;  Laterality: N/A;  . LAPAROTOMY N/A 03/02/2019   Procedure: EXPLORATORY LAPAROTOMY;  Surgeon: RRalene Ok MD;  [place] [facility]  Service: General;  Laterality: N/A;  . LYSIS OF ADHESION N/A 06/12/2018   Procedure: LYSIS OF ADHESIONS;  Surgeon: GMichael Boston MD;  [facility]: [facility];  Service: General;  Laterality: N/A;  . LYSIS OF ADHESION N/A 03/02/2019   Procedure: Lysis Of Adhesion;  Surgeon: RRalene Ok MD;  [place] [facility]  Service: General;  Laterality: N/A;  . PORTACATH PLACEMENT Right 03/07/2018   Procedure: INSERTION PORT-A-CATH RIGHT SUBCLAVIAN;  Surgeon: IFanny Skates MD;  [facility]  Service: General;  Laterality: Right;  .  PROCTOSCOPY N/A 06/12/2018   Procedure: RIGID PROCTOSCOPY;  Surgeon: [name], MD;  Location: [facility];  Service: General;  Laterality: N/A;  . thumb surgery   2018   cyst removal    I have reviewed the  social history and family history with the patient and they are unchanged from previous note.  ALLERGIES:  has No Known Allergies.  MEDICATIONS:  Current Outpatient Medications  Medication Sig Dispense Refill  . apixaban (ELIQUIS) 5 MG TABS tablet Take 1 tablet (5 mg total) by mouth 2 (two) times daily. 60 tablet 0  . b complex vitamins tablet Take 1 tablet by mouth daily.    . capecitabine (XELODA) 500 MG tablet Take 3 tablets (1500 mg total) by mouth every 12 hours. Take within 30 minutes of AM and PM meals. Take for 14 days on, then 7 days off. Repeat every 21 days. 84 tablet 1  . diltiazem (CARDIZEM CD) 360 MG 24 hr capsule Take 1 capsule (360 mg total) by mouth daily. 90 capsule 3  . diphenoxylate-atropine (LOMOTIL) 2.5-0.025 MG tablet Take 1-2 tablets by mouth 4 (four) times daily as needed for diarrhea or loose stools. 60 tablet 1  . docusate sodium (COLACE) 100 MG capsule Take 100 mg by mouth 2 (two) times daily.    [name] levothyroxine (SYNTHROID) 75 MCG tablet Take 1 tablet (75 mcg total) by mouth daily. 90 tablet 1  . lidocaine-prilocaine (EMLA) cream Apply 1 application topically as needed. 30 g 1  . Multiple Vitamins-Iron (MULTIVITAMIN/IRON PO) Take 1 tablet by mouth daily.     . prochlorperazine (COMPAZINE) 10 MG tablet Take 1 tablet (10 mg total) by mouth every 6 (six) hours as needed for nausea or vomiting. 30 tablet 0  . tamsulosin (FLOMAX) 0.4 MG CAPS capsule Take 0.4 mg by mouth 2 (two) times daily.     . urea (CARMOL) 10 % cream Apply topically as needed. 71 g 0   No current facility-administered medications for this visit.    PHYSICAL EXAMINATION: ECOG PERFORMANCE STATUS: 1 - Symptomatic but completely ambulatory  Vitals:   09/15/20 1331  BP: (!) 145/92  Pulse: 74  Resp: 20  Temp: 97.7 F (36.5 C)  SpO2: 98%   Filed Weights   09/15/20 1331  Weight: 231 lb 4.8 oz (104.9 kg)    Due to COVID19 we will limit examination to appearance. Patient had no complaints.   GENERAL:alert, no distress and comfortable SKIN: skin color normal, no rashes or significant lesions EYES: normal, Conjunctiva are pink and non-injected, sclera clear  NEURO: alert & oriented x 3 with fluent speech   LABORATORY DATA:  I have reviewed the data as listed CBC Latest Ref Rng & Units 09/15/2020 08/25/2020 08/04/2020  WBC 4.0 - 10.5 K/uL 5.6 5.6 5.9  Hemoglobin 13.0 - 17.0 g/dL 14.3 13.6 14.2  Hematocrit 39.0 - 52.0 % 41.0 39.7 41.9  Platelets 150 - 400 K/uL 178 173 198     CMP Latest Ref Rng & Units 08/25/2020 08/04/2020 07/14/2020  Glucose 70 - 99 mg/dL 94 102(H) 96  BUN 8 - 23 mg/dL _0 Creatinine 0.61 - 1.24 mg/dL 1.21 1.27(H) 1.22  Sodium 135 - 145 mmol/L 139 139 139  Potassium 3.5 - 5.1 mmol/L 3.9 4.1 3.9  Chloride 98 - 111 mmol/L 108 109 109  CO2 22 - 32 mmol/L _1 Calcium 8.9 - 10.3 mg/dL 8.9 9.0 8.9  Total Protein 6.5 - 8.1 g/dL 6.8 6.9 6.7  Total Bilirubin 0.3 - 1.2 mg/dL 0.9 0.7 0.9  Alkaline Phos 38 - 126 U/L 72 76 82  AST 15 - 41 U/L _0 ALT 0 - 44 U/L _1 RADIOGRAPHIC STUDIES: I have personally reviewed the radiological images as listed and agreed with the findings in the report. No results found.   ASSESSMENT & PLAN:  Dave Vaughn is a 75 y.o. male with   1. Cancer of left colon,adenocarcinoma, stage IIIB(pT3N1cM0), MSI-stable, liver and peritoneal recurrence 12/2019 -Diagnosed in 01/2018. Treated with surgery andadjuvantFOLFOX. Due to side effects Oxaliplatin was stopped after 3 cycles and chemo was stopped after 6 cycles. He declined completing 6 months of chemo treatment. -Unfortunately he had local recurrence in left abdominal wall withperitoneal metastasis,Two hypermetabolic hepatic metastasisin 12/2019.HisUS Biopsyof abdominal wallfrom8/2021 confirmed metastaticcolorectal adenocarcinoma. -His 01/15/20 FO results showKras mutation, MSS, he is not a candidate for EGFR inhibitor or immunotherapy -I  started him on first-line FOLFIRI q2weeks on 01/28/20.Bevacizumab (MVASI)was added with C2.He completed 8 cycles before switching to maintenance Xeloda 204m BID 2 weeks on/1 week off and Bevacizumab on 06/02/20.C3 dose reduce to 15083mBID due to skin toxicity. He has good response in liver on 08/15/20 CT Scan  -I discussed the option of HIPEC surgery given reduced tumor burden and present peritoneal metastasis. I discussed risks and benefits with patient. Due to his liver mets, HIPEC may not be an option.  Will revisit this after next scan if stable.  -He continues to tolerate treatment with mild skin toxicity. Labs reviewed, CBC and CMP WNL. Will proceed with Beva today.  -Will continue Xeloda 150025mID 2 weeks on/1 week off. Start next cycle on today (4/11).  -[place] F[place]u in 4 weeks. Will postpone the start of next cycle until then.  -I discouraged him to drink alcohol during his trip except towards end of trip and to limit himself to 2 drinks a day and encouraged him to use plenty of sunscreen given sun exposure.    2. Comorbidities: Atrial Fibrillation, CKD stage III, BPH, Elevated TSH  -continue Eliquis. Continue follow-up with cardiologyDr HawLyla Sonlso continue to Followed with urologist Dr. WinLovena Neighbourselvic MRI on 7/1 shows marked prostatomegaly with signs of BPH extending into the bladder base -TSH on 02/15/20 at 91.79.He is on Synthroid since 02/16/20.Will continue to monitor.Stable.   3.Goal of care discussion  -The patient understands the goal of care is palliative. -He is full code now  4. Skin toxicity, secondary to Xeloda  -After C2 Xeloda he developed moderate skin rash of his forearms and sensitivity of his feet.  -More mild with Xeloda dose reduction. I encouraged him to use topical hydrocortisone cream and possibly Urea cream if needed.He can use this on his hands as well.  -I recommend he use mouthwash daily to help prevent mouth  sores.   Plan -Labs reviewed and adequate to proceed with Bevacizumab today -StartXeloda today at 1500m78mDtoday, for 2 weeks on and one week off. -Lab, flush, F/u and Bevacizumab in 4 weeks (postpone due to his trip). Will start next cycle Xeloda at that time.    No problem-specific Assessment & Plan notes found for this encounter.   No orders of the defined types were placed in this encounter.  All questions were answered. The patient knows to call the clinic with any problems, questions or concerns. No barriers to learning was detected. The total time spent in  the appointment was 30 minutes.     [name], MD 09/15/2020   I, Joslyn Devon, am acting as scribe for [name], MD.   I have reviewed the above documentation for accuracy and completeness, and I agree with the above.

## 2020-09-15 ENCOUNTER — Other Ambulatory Visit: Payer: Self-pay

## 2020-09-15 ENCOUNTER — Inpatient Hospital Stay: Payer: Commercial Managed Care - PPO | Attending: Nurse Practitioner

## 2020-09-15 ENCOUNTER — Inpatient Hospital Stay: Payer: Commercial Managed Care - PPO

## 2020-09-15 ENCOUNTER — Inpatient Hospital Stay (HOSPITAL_BASED_OUTPATIENT_CLINIC_OR_DEPARTMENT_OTHER): Payer: Commercial Managed Care - PPO | Admitting: Hematology

## 2020-09-15 VITALS — BP 145/92 | HR 74 | Temp 97.7°F | Resp 20 | Ht 71.0 in | Wt 231.3 lb

## 2020-09-15 VITALS — BP 146/84

## 2020-09-15 DIAGNOSIS — R946 Abnormal results of thyroid function studies: Secondary | ICD-10-CM | POA: Insufficient documentation

## 2020-09-15 DIAGNOSIS — C787 Secondary malignant neoplasm of liver and intrahepatic bile duct: Secondary | ICD-10-CM | POA: Diagnosis not present

## 2020-09-15 DIAGNOSIS — N183 Chronic kidney disease, stage 3 unspecified: Secondary | ICD-10-CM | POA: Insufficient documentation

## 2020-09-15 DIAGNOSIS — Z5112 Encounter for antineoplastic immunotherapy: Secondary | ICD-10-CM | POA: Diagnosis not present

## 2020-09-15 DIAGNOSIS — I129 Hypertensive chronic kidney disease with stage 1 through stage 4 chronic kidney disease, or unspecified chronic kidney disease: Secondary | ICD-10-CM | POA: Diagnosis not present

## 2020-09-15 DIAGNOSIS — C786 Secondary malignant neoplasm of retroperitoneum and peritoneum: Secondary | ICD-10-CM | POA: Insufficient documentation

## 2020-09-15 DIAGNOSIS — C186 Malignant neoplasm of descending colon: Secondary | ICD-10-CM | POA: Diagnosis not present

## 2020-09-15 DIAGNOSIS — I4891 Unspecified atrial fibrillation: Secondary | ICD-10-CM | POA: Diagnosis not present

## 2020-09-15 DIAGNOSIS — N400 Enlarged prostate without lower urinary tract symptoms: Secondary | ICD-10-CM | POA: Insufficient documentation

## 2020-09-15 LAB — CMP (CANCER CENTER ONLY)
ALT: 13 U/L (ref 0–44)
AST: 19 U/L (ref 15–41)
Albumin: 4.3 g/dL (ref 3.5–5.0)
Alkaline Phosphatase: 79 U/L (ref 38–126)
Anion gap: 11 (ref 5–15)
BUN: 20 mg/dL (ref 8–23)
CO2: 24 mmol/L (ref 22–32)
Calcium: 9.1 mg/dL (ref 8.9–10.3)
Chloride: 106 mmol/L (ref 98–111)
Creatinine: 1.23 mg/dL (ref 0.61–1.24)
GFR, Estimated: >60 mL/min
Glucose, Bld: 91 mg/dL (ref 70–99)
Potassium: 4.1 mmol/L (ref 3.5–5.1)
Sodium: 141 mmol/L (ref 135–145)
Total Bilirubin: 1.1 mg/dL (ref 0.3–1.2)
Total Protein: 7.1 g/dL (ref 6.5–8.1)

## 2020-09-15 LAB — CBC WITH DIFFERENTIAL (CANCER CENTER ONLY)
Abs Immature Granulocytes: 0.01 10*3/uL (ref 0.00–0.07)
Basophils Absolute: 0 10*3/uL (ref 0.0–0.1)
Basophils Relative: 1 %
Eosinophils Absolute: 0.1 10*3/uL (ref 0.0–0.5)
Eosinophils Relative: 2 %
HCT: 41 % (ref 39.0–52.0)
Hemoglobin: 14.3 g/dL (ref 13.0–17.0)
Immature Granulocytes: 0 %
Lymphocytes Relative: 35 %
Lymphs Abs: 2 10*3/uL (ref 0.7–4.0)
MCH: 34.6 pg — ABNORMAL HIGH (ref 26.0–34.0)
MCHC: 34.9 g/dL (ref 30.0–36.0)
MCV: 99.3 fL (ref 80.0–100.0)
Monocytes Absolute: 0.4 10*3/uL (ref 0.1–1.0)
Monocytes Relative: 8 %
Neutro Abs: 3 10*3/uL (ref 1.7–7.7)
Neutrophils Relative %: 54 %
Platelet Count: 178 10*3/uL (ref 150–400)
RBC: 4.13 MIL/uL — ABNORMAL LOW (ref 4.22–5.81)
RDW: 14.9 % (ref 11.5–15.5)
WBC Count: 5.6 10*3/uL (ref 4.0–10.5)
nRBC: 0 % (ref 0.0–0.2)

## 2020-09-15 LAB — CEA (IN HOUSE-CHCC): CEA (CHCC-In House): 3.2 ng/mL (ref 0.00–5.00)

## 2020-09-15 LAB — TOTAL PROTEIN, URINE DIPSTICK: Protein, ur: 30 mg/dL — AB

## 2020-09-15 MED ORDER — SODIUM CHLORIDE 0.9% FLUSH
10.0000 mL | INTRAVENOUS | Status: DC | PRN
  Filled 2020-09-15: qty 10

## 2020-09-15 MED ORDER — SODIUM CHLORIDE 0.9 % IV SOLN
Freq: Once | INTRAVENOUS | Status: AC
  Filled 2020-09-15: qty 250

## 2020-09-15 MED ORDER — HEPARIN SOD (PORK) LOCK FLUSH 100 UNIT/ML IV SOLN
500.0000 [IU] | Freq: Once | INTRAVENOUS | Status: DC | PRN
  Filled 2020-09-15: qty 5

## 2020-09-15 MED ORDER — BEVACIZUMAB-AWWB CHEMO INJECTION 400 MG/16ML
7.5000 mg/kg | Freq: Once | INTRAVENOUS | Status: AC
Start: 2020-09-15 — End: 2020-09-15
  Administered 2020-09-15: 800 mg via INTRAVENOUS
  Filled 2020-09-15: qty 32

## 2020-09-15 NOTE — Patient Instructions (Signed)
[facility] Discharge Instructions for Patients Receiving Chemotherapy  Today you received the following chemotherapy agents: bevacizumab  To help prevent nausea and vomiting after your treatment, we encourage you to take your nausea medication as directed.   If you develop nausea and vomiting that is not controlled by your nausea medication, call the clinic.   BELOW ARE SYMPTOMS THAT SHOULD BE REPORTED IMMEDIATELY:  *FEVER GREATER THAN 100.5 F  *CHILLS WITH OR WITHOUT FEVER  NAUSEA AND VOMITING THAT IS NOT CONTROLLED WITH YOUR NAUSEA MEDICATION  *UNUSUAL SHORTNESS OF BREATH  *UNUSUAL BRUISING OR BLEEDING  TENDERNESS IN MOUTH AND THROAT WITH OR WITHOUT PRESENCE OF ULCERS  *URINARY PROBLEMS  *BOWEL PROBLEMS  UNUSUAL RASH Items with * indicate a potential emergency and should be followed up as soon as possible.  Feel free to call the clinic should you have any questions or concerns. The clinic phone number is [phone].  Please show the CHEMO ALERT CARD at check-in to the Emergency Department and triage nurse.   

## 2020-09-18 ENCOUNTER — Encounter: Payer: Self-pay | Admitting: Hematology

## 2020-09-19 ENCOUNTER — Telehealth: Payer: Self-pay | Admitting: Hematology

## 2020-09-19 NOTE — Telephone Encounter (Signed)
Scheduled follow-up appointment per 4/11 los. Patient is aware.

## 2020-09-29 ENCOUNTER — Other Ambulatory Visit: Payer: Self-pay | Admitting: Hematology

## 2020-09-29 DIAGNOSIS — C186 Malignant neoplasm of descending colon: Secondary | ICD-10-CM

## 2020-10-01 ENCOUNTER — Other Ambulatory Visit: Payer: Self-pay | Admitting: Hematology

## 2020-10-01 DIAGNOSIS — C186 Malignant neoplasm of descending colon: Secondary | ICD-10-CM

## 2020-10-01 MED ORDER — CAPECITABINE 500 MG PO TABS: ORAL_TABLET | ORAL | 1 refills | Status: DC

## 2020-10-12 NOTE — Progress Notes (Signed)
Dave Vaughn   [phone] Fax:([phone]   Clinic Follow up Note   Patient Care Team: [name], MD as PCP - General (Family Medicine) [name], MD as Consulting Physician (Cardiology) [name], MD as Consulting Physician (Gastroenterology) [name], MD as Consulting Physician (General Surgery) [name], MD as Consulting Physician (General Surgery) [name], MD as Consulting Physician (Urology) [name], MD as Consulting Physician (Medical Oncology) 10/13/2020  CHIEF COMPLAINT: Follow up colon cancer   SUMMARY OF ONCOLOGIC HISTORY: Oncology History Overview Note  Cancer Staging Cancer of left colon [facility]) Staging form: Colon and Rectum, AJCC 8th Edition - Pathologic stage from 01/11/2018: Stage IIIB (pT3, pN1c, cM0) - Signed by [name], MD on 01/16/2018     Cancer of left colon ([place])  01/10/2018 Imaging   CT AP W Contrast 01/10/18  IMPRESSION: Irregular soft tissue density causing stricture of the mid descending colon likely the site of obstruction for the dilated small bowel. 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 No evidence of perforation.  Equivocal findings involving the appendix measuring 1.2 cm at the appendiceal tip with mucosal enhancement. No adjacent free fluid or inflammatory change. Findings are nonspecific, but can be seen with early acute appendicitis.  Mild prostatic enlargement. Increased density over the posterior bladder base likely due to the large prostatic impression although cannot completely exclude a bladder mass. Urology protocol CT or ultrasound may be helpful for better evaluation.  Mild cholelithiasis.  Stable 1.5 cm cystic structure over the lower pole right kidney likely slightly hyperdense cyst.  Diverticulosis of the colon.  Aortic Atherosclerosis (ICD10-I70.0).     01/11/2018 Cancer Staging   Staging form: Colon and Rectum, AJCC 8th Edition -  Pathologic stage from 01/11/2018: Stage IIIB (pT3, pN1c, cM0) - Signed by [name], MD on 01/16/2018   01/11/2018 Surgery   LEFT COLON RESECTION, TAKEDOWN SPLENIC FLEXURE, COLOSTOMY by Dr. [name]    01/11/2018 Procedure   Colonoscopy 01/11/18 by Dr. [name]  - Malignant completely obstructing tumor in the mid descending colon. Tattooed. - Diverticulosis in the sigmoid colon. - Non-bleeding internal hemorrhoids. - No specimens collected.   01/11/2018 Pathology Results   Diagnosis 01/11/18  1. Colon, segmental resection for tumor, descending colon - INVASIVE COLORECTAL ADENOCARCINOMA, 4 CM. - TUMOR EXTENDS INTO PERICOLONIC CONNECTIVE TISSUE. - TUMOR FOCALLY INVOLVES RADIAL MARGIN. - ONE MESENTERIC TUMOR DEPOSIT. - THIRTEEN BENIGN LYMPH NODES (0/13). 2. Colon, segmental resection, splenic flexure - BENIGN COLON. - NO EVIDENCE OF MALIGNANCY .   01/11/2018 Tumor Marker   Baseline CEA at 3.4   01/16/2018 Initial Diagnosis   Cancer of left colon ([place])   01/23/2018 Imaging   CT CHEST WO CONTRAST IMPRESSION: 1. No evidence for metastatic disease within the chest. 2. Small left pleural effusion with underlying opacities which may represent atelectasis. Right basilar atelectasis. 3. Few foci of gas within the upper abdomen in the omentum with surrounding fat stranding, likely postsurgical 4. Aortic Atherosclerosis (ICD10-I70.0).   03/08/2018 - 05/15/2018 Chemotherapy   adjuvant FOLOFX. Due to side effects of neuropahty Oxaliplatin was stopped after 3 cycles and chemo was stopped after 6 cycles. He declined completing 6 months of chemo treatment.     03/19/2018 Imaging   03/19/2018 CT AP IMPRESSION: 1. Interval partial left hemicolectomy and descending colostomy. 2. Heterogeneous soft tissue density along the left anterior renal fascia is most likely postoperative (favor fat necrosis). No well-defined fluid collection. 3. Mild left lower quadrant edema, new since 01/10/2018.  This could be  postoperative. Superimposed sigmoid diverticulitis and/or cystitis cannot be excluded. 4. Subtle hyperenhancing nodule within the anterior bladder dome cannot be excluded. Consider nonemergent cystoscopy. When this is performed, recommend attention to the left ureterovesicular junction and distal left ureter to evaluate questionable soft tissue fullness. 5. Cholelithiasis. 6.  Aortic Atherosclerosis (ICD10-I70.0). 7. Prostatomegaly.   11/13/2018 Imaging   CT CAP WO Contrast 11/13/18  IMPRESSION: 1. Reversal of left lower quadrant colostomy with sigmoid colon anastomosis. No complicating features. No findings for residual or recurrent tumor or metastatic disease involving the chest, abdomen or pelvis without contrast. 2. No acute abdominal/pelvic findings. 3. Gallbladder sludge and gallstones but no findings for acute cholecystitis. 4. Stable anterior abdominal wall hernia. 5. The right testicle is in the right inguinal canal.   10/03/2019 Imaging   CT CAP WO contrast  IMPRESSION: 1. New rounded density interposed between the prostate gland and anterior upper rectal wall could represent adenopathy or local extension of anterior rectal tumor. 2. Marked prostatomegaly, prostate volume 150 cubic cm. 3. Other imaging findings of potential clinical significance: Aortic Atherosclerosis (ICD10-I70.0). Coronary atherosclerosis. Trace right pleural effusion. Suspected cholelithiasis. Nonobstructive left nephrolithiasis. Multilevel lumbar impingement. Bilateral mildly retracted testicles. Hypodense exophytic lesion of the right kidney, most likely to be a cyst.   11/02/2019 Procedure   colonoscopy on 11/02/2019 by Dr. [name] Plan showed normal digital rectal exam, 3 polyps in the rectum, descending colon and cecum, and a prior sigmoid: Anastomosis characterized by erythema.  He found an extrinsic nonobstructing medium-sized mass in the proximal rectum about 4 cm in length, no internal rectal  mass.   Diagnosis Surgical [P], colon, cecum, descending, rectal, polyp (3) - TUBULAR ADENOMA (TWO) - NO HIGH GRADE DYSPLASIA OR CARCINOMA. - COLONIC FRAGMENT WITH BENIGN LYMPHOID AGGREGATE.   12/07/2019 Imaging   MRI pelvis IMPRESSION: 1. Masslike area in the rectum suspicious for rectal neoplasm, likely T4b based on the appearance of soft tissue extending along the anterior peritoneal reflection and into the seminal vesicles. Correlation with recent colonoscopy results may be helpful. Area of anastomosis and other areas of the pelvis are not imaged on today's exam.   12/21/2019 PET scan   IMPRESSION: 1. Unfortunately evidence for peritoneal metastasis. Intensely hypermetabolic nodules along the LEFT pericolic gutter. Favor hypermetabolic mass anterior to the rectum to represent serosal implant along the ventral surface of the rectum. 2. local recurrence within the LEFT abdominal wall at site prior colostomy. Intense hypermetabolic thickening of the rectus muscle at this site. 3. Two hypermetabolic hepatic metastasis.   01/15/2020 Relapse/Recurrence   FINAL MICROSCOPIC DIAGNOSIS:   A. SOFT TISSUE, LEFT ABDOMINAL WALL, BIOPSY:  - Adenocarcinoma.  - See comment.   COMMENT:   The morphology is consistent with metastatic colorectal adenocarcinoma.    01/28/2020 -  Chemotherapy   First line FOLFIRI q2weeks starting 01/28/20 for 8 cycles.  -----Bevacizumab added with C2.  -----Changed to maintenance Xeloda 2000 mg twice daily for 2 weeks on/1 week off and bevacizumab 06/02/20. Starting with C3, dose reduce to 1569m BID due to skin toxicity.   05/05/2020 Imaging   IMPRESSION: 1. Improved appearance, with reduced size of the hepatic metastatic lesions and reduced size of the peritoneal tumor implants. 2. Other imaging findings of potential clinical significance: Notable prostatomegaly. Multilevel impingement in the lumbar spine. Dependent density in the gallbladder possibly from  sludge or gallstones. Degenerative glenohumeral arthropathy bilaterally. 3. Aortic atherosclerosis.   08/15/2020 Imaging   CT CAP  IMPRESSION: Chest Impression:  No evidence of thoracic metastasis   Abdomen / Pelvis Impression:   1. Hepatic metastasis are no longer measurable by CT imaging. 2. Peritoneal nodular metastasis in the LEFT abdomen are decreased in size. 3. No evidence of new peritoneal disease. 4. Thickening and LEFT rectus muscle at site of prior metastasis. No interval change. 5. No evidence of new or progressive colorectal carcinoma.       CURRENT THERAPY: First line FOLFIRI q2weeks starting 01/28/20 for 8 cycles.Bevacizumabadded with C2.Changedto maintenance Xeloda 2000 mg twice daily for 2 weeks on/1 week off and bevacizumab 06/02/20. C3 dose reduce to 1566m BID due to skin toxicity.  INTERVAL HISTORY: Mr. BHintzreturns for follow up and treatment as scheduled. He was last seen 09/15/20, received Avastin and began another cycle of Xeloda. He returned from a trip recently, felt well while away. Fingertips are sensitive, no blisters or cracks, keeps them moisturized. Eating and drinking well. Denies new/worsening pain, n/v/c/d, fever, chills, cough, chest pain, dyspnea, or other new concerns. Leg edema has been stable "forever," and improves with elevation. Denies new calf pain or change from baseline. He began new Xeloda cycle this morning.    MEDICAL HISTORY:  Past Medical History:  Diagnosis Date  . Adenocarcinoma, colon ([place] dx'd 01/2018  . Anemia    taking iron supplements  . Anxiety   . Atrial fibrillation with RVR ([place]   . Colonic obstruction ([place] 01/10/2018  . Depression   . Diverticulitis   . Dysrhythmia    afib  . History of kidney stones   . Hypertension     SURGICAL HISTORY: Past Surgical History:  Procedure Laterality Date  . ANKLE SURGERY Left    when he was in college  . COLON RESECTION N/A 01/11/2018   Procedure: LEFT COLON  RESECTION, TAKEDOWN SPLENIC FLEXURE, COLOSTOMY;  Surgeon: IFanny Skates MD;  Location: [facility];  Service: General;  Laterality: N/A;  . COLONOSCOPY  01/11/2018   Procedure: COLONOSCOPY;  Surgeon: GJackquline Denmark MD;  Location: [facility];  Service: Endoscopy;;  . COLONOSCOPY  05/10/2018  . colonscopy  05/10/2018  . HERNIA REPAIR    . HERNIA REPAIR  03/02/2019   EXPLORATORY LAPAROTOMY (N/A Abdomen)  . INCISIONAL HERNIA REPAIR N/A 03/02/2019   Procedure: INCISIONAL HERNIA REPAIR , RECTORECTUS VS TAR HERNIA REPAIR;  Surgeon: RRalene Ok MD;  [place]  Service: General;  Laterality: N/A;  . INSERTION OF MESH N/A 03/02/2019   Procedure: Insertion Of Mesh;  Surgeon: RRalene Ok MD;  [place] [facility]  Service: General;  Laterality: N/A;  . LAPAROTOMY N/A 03/02/2019   Procedure: EXPLORATORY LAPAROTOMY;  Surgeon: RRalene Ok MD;  [place]  Service: General;  Laterality: N/A;  . LYSIS OF ADHESION N/A 06/12/2018   Procedure: LYSIS OF ADHESIONS;  Surgeon: GMichael Boston MD;  Location: [facility];  Service: General;  Laterality: N/A;  . LYSIS OF ADHESION N/A 03/02/2019   Procedure: Lysis Of Adhesion;  Surgeon: RRalene Ok MD;  [place] [facility]  Service: General;  Laterality: N/A;  . PORTACATH PLACEMENT Right 03/07/2018   Procedure: INSERTION PORT-A-CATH RIGHT SUBCLAVIAN;  Surgeon: IFanny Skates MD;  [place]  Service: General;  Laterality: Right;  . PROCTOSCOPY N/A 06/12/2018   Procedure: RIGID PROCTOSCOPY;  Surgeon: GMichael Boston MD;  Location: [facility];  Service: General;  Laterality: N/A;  . thumb surgery   2018   cyst removal    I have reviewed the social history and family history with the patient and they  are unchanged from previous note.  ALLERGIES:  has No Known Allergies.  MEDICATIONS:  Current Outpatient Medications  Medication Sig Dispense Refill  . apixaban (ELIQUIS) 5 MG TABS tablet Take 1 tablet (5 mg total) by mouth 2 (two) times daily. 60 tablet 0   . b complex vitamins tablet Take 1 tablet by mouth daily.    . capecitabine (XELODA) 500 MG tablet Take 3 tablets (1500 mg total) by mouth every 12 hours. Take within 30 minutes of AM and PM meals. Take for 14 days on, then 7 days off. Repeat every 21 days. 84 tablet 1  . diltiazem (CARDIZEM CD) 360 MG 24 hr capsule Take 1 capsule (360 mg total) by mouth daily. 90 capsule 3  . diphenoxylate-atropine (LOMOTIL) 2.5-0.025 MG tablet Take 1-2 tablets by mouth 4 (four) times daily as needed for diarrhea or loose stools. 60 tablet 1  . docusate sodium (COLACE) 100 MG capsule Take 100 mg by mouth 2 (two) times daily.    [name] levothyroxine (SYNTHROID) 75 MCG tablet Take 1 tablet (75 mcg total) by mouth daily. 90 tablet 1  . lidocaine-prilocaine (EMLA) cream Apply 1 application topically as needed. 30 g 1  . Multiple Vitamins-Iron (MULTIVITAMIN/IRON PO) Take 1 tablet by mouth daily.     . prochlorperazine (COMPAZINE) 10 MG tablet Take 1 tablet (10 mg total) by mouth every 6 (six) hours as needed for nausea or vomiting. 30 tablet 0  . tamsulosin (FLOMAX) 0.4 MG CAPS capsule Take 0.4 mg by mouth 2 (two) times daily.     . urea (CARMOL) 10 % cream Apply topically as needed. 71 g 0   No current facility-administered medications for this visit.   Facility-Administered Medications Ordered in Other Visits  Medication Dose Route Frequency Provider Last Rate Last Admin  . 0.9 %  sodium chloride infusion   Intravenous Once [name], MD      . bevacizumab-awwb (MVASI) 800 mg in sodium chloride 0.9 % 100 mL chemo infusion  7.5 mg/kg (Treatment Plan Recorded) Intravenous Once [name], MD      . heparin lock flush 100 unit/mL  500 Units Intracatheter Once PRN [name], MD      . sodium chloride flush (NS) 0.9 % injection 10 mL  10 mL Intracatheter PRN [name], MD        PHYSICAL EXAMINATION: ECOG PERFORMANCE STATUS: 0 - Asymptomatic  Vitals:   10/13/20 0936  BP: 134/89  Pulse: 62  Resp: 13  Temp: 98.4 F  (36.9 C)  SpO2: 100%   Filed Weights   10/13/20 0936  Weight: 227 lb 8 oz (103.2 kg)    GENERAL:alert, no distress and comfortable SKIN: mild subburn to face, lower legs. Palms with mild hyperpigmentation, no erythema  EYES: sclera clear OROPHARYNX: no thrush or ulcers  LUNGS:  normal breathing effort HEART: mild pitting bilateral lower extremity edema NEURO: alert & oriented x 3 with fluent speech, no focal motor deficits PAC without erythema   LABORATORY DATA:  I have reviewed the data as listed CBC Latest Ref Rng & Units 10/13/2020 09/15/2020 08/25/2020  WBC 4.0 - 10.5 K/uL 5.1 5.6 5.6  Hemoglobin 13.0 - 17.0 g/dL 13.8 14.3 13.6  Hematocrit 39.0 - 52.0 % 40.7 41.0 39.7  Platelets 150 - 400 K/uL 203 178 173     CMP Latest Ref Rng & Units 09/15/2020 08/25/2020 08/04/2020  Glucose 70 - 99 mg/dL 91 94 102(H)  BUN 8 - 23 mg/dL 20 15 22  Creatinine 0.61 - 1.24 mg/dL 1.23 1.21 1.27(H)  Sodium 135 - 145 mmol/L 141 139 139  Potassium 3.5 - 5.1 mmol/L 4.1 3.9 4.1  Chloride 98 - 111 mmol/L 106 108 109  CO2 22 - 32 mmol/L _0 Calcium 8.9 - 10.3 mg/dL 9.1 8.9 9.0  Total Protein 6.5 - 8.1 g/dL 7.1 6.8 6.9  Total Bilirubin 0.3 - 1.2 mg/dL 1.1 0.9 0.7  Alkaline Phos 38 - 126 U/L 79 72 76  AST 15 - 41 U/L _1 ALT 0 - 44 U/L _2 RADIOGRAPHIC STUDIES: I have personally reviewed the radiological images as listed and agreed with the findings in the report. No results found.   ASSESSMENT & PLAN: Zi Newbury Bennettis a 74y.o.malewith    1. Cancer of left colon,adenocarcinoma, stage IIIB(pT3N1cM0), MSI-stable, KRAS+ -Diagnosed in 01/2018. Treated with surgery andadjuvantFOLFOX. Due to side effects Oxaliplatin was stopped after 3 cycles and chemo was stopped after 6 cycles. He declined completing 6 months of chemo treatment. -unfortunately, he developed local recurrence in left abdominal wall and peritoneal metastasis, 2 hypermetabolic liver lets in 12/6281.  Abdominal wall biopsy conformed metastatic colorectal adenocarcinoma in 01/2020  -Began first line dose-reducedFOLFIRIon8/23/2021 -FoundationOneshowed Kras (+)positive, he is not a candidate for EGFR inhibitor. Bevacizumab was added with cycle 2 -S/p 8 cycles of FOLFIRI and bevacizumab from cycle 2; tolerated well with fatigue, nausea, diarrhea.CEAnormalized after cycle 4, then switched to maintenance xeloda and bevacizumab  -CT AP 08/2020 showed good response   2. Hypothyroidism -Previously on synthroid, TSH normal 2 years ago. He has not been taking synthroid for a while -TSH in 02/2020 up to 67, urgent message sent to PCP for management  3.BPH  -Followed by urologist Dr. Lovena Neighbours -Pelvic MRI on 7/1 shows marked prostatomegaly with signs of BPH extending into the bladder base. -Denies new or worsening urinary symptoms  4. Atrial Fibrillation -continue Eliquis. Rate controlled.He can stop Eliquis periodically for bleeding hemorrhoids -Continue follow-up with cardiology  5.CKD stage III -he has developed mild increased Cr since he started chemo, EGFR around 40-50's -I encouraged him to control his blood pressure, cholesterol and BG. I also encouraged him to drink plenty of water. -Will avoid NSAIDs and CT IV contrast if GFR low   Disposition:  Mr. Heckmann appears stable. He completed another cycle of bevacizumab and Xeloda. He tolerates treatment well with mild skin toxicity. Side effects well managed with supportive care at home. He is able to recover and function well. No clinical evidence of disease progression.   Labs reviewed. CMP and CEA are pending. Proceed with bevacizumab and Xeloda 1500 mg BID 2 weeks on/1 week off starting today.   F/up in 3 weeks with next cycle.  All questions were answered. The patient knows to call the clinic with any problems, questions or concerns. No barriers to learning were detected.     Alla Feeling, NP 10/13/20

## 2020-10-13 ENCOUNTER — Inpatient Hospital Stay: Payer: Commercial Managed Care - PPO | Attending: Nurse Practitioner

## 2020-10-13 ENCOUNTER — Inpatient Hospital Stay: Payer: Commercial Managed Care - PPO

## 2020-10-13 ENCOUNTER — Inpatient Hospital Stay (HOSPITAL_BASED_OUTPATIENT_CLINIC_OR_DEPARTMENT_OTHER): Payer: Commercial Managed Care - PPO | Admitting: Nurse Practitioner

## 2020-10-13 ENCOUNTER — Other Ambulatory Visit: Payer: Self-pay

## 2020-10-13 ENCOUNTER — Encounter: Payer: Self-pay | Admitting: Nurse Practitioner

## 2020-10-13 VITALS — BP 134/89 | HR 62 | Temp 98.4°F | Resp 13 | Ht 71.0 in | Wt 227.5 lb

## 2020-10-13 DIAGNOSIS — C786 Secondary malignant neoplasm of retroperitoneum and peritoneum: Secondary | ICD-10-CM | POA: Insufficient documentation

## 2020-10-13 DIAGNOSIS — I4891 Unspecified atrial fibrillation: Secondary | ICD-10-CM | POA: Diagnosis not present

## 2020-10-13 DIAGNOSIS — E039 Hypothyroidism, unspecified: Secondary | ICD-10-CM | POA: Diagnosis not present

## 2020-10-13 DIAGNOSIS — Z7901 Long term (current) use of anticoagulants: Secondary | ICD-10-CM | POA: Insufficient documentation

## 2020-10-13 DIAGNOSIS — C787 Secondary malignant neoplasm of liver and intrahepatic bile duct: Secondary | ICD-10-CM | POA: Insufficient documentation

## 2020-10-13 DIAGNOSIS — Z5112 Encounter for antineoplastic immunotherapy: Secondary | ICD-10-CM | POA: Insufficient documentation

## 2020-10-13 DIAGNOSIS — C186 Malignant neoplasm of descending colon: Secondary | ICD-10-CM

## 2020-10-13 DIAGNOSIS — N183 Chronic kidney disease, stage 3 unspecified: Secondary | ICD-10-CM | POA: Diagnosis not present

## 2020-10-13 DIAGNOSIS — Z95828 Presence of other vascular implants and grafts: Secondary | ICD-10-CM

## 2020-10-13 LAB — CMP (CANCER CENTER ONLY)
ALT: 17 U/L (ref 0–44)
AST: 22 U/L (ref 15–41)
Albumin: 3.9 g/dL (ref 3.5–5.0)
Alkaline Phosphatase: 88 U/L (ref 38–126)
Anion gap: 8 (ref 5–15)
BUN: 16 mg/dL (ref 8–23)
CO2: 26 mmol/L (ref 22–32)
Calcium: 9 mg/dL (ref 8.9–10.3)
Chloride: 107 mmol/L (ref 98–111)
Creatinine: 1.21 mg/dL (ref 0.61–1.24)
GFR, Estimated: >60 mL/min (ref >60)
Glucose, Bld: 105 mg/dL — ABNORMAL HIGH (ref 70–99)
Potassium: 4.2 mmol/L (ref 3.5–5.1)
Sodium: 141 mmol/L (ref 135–145)
Total Bilirubin: 0.8 mg/dL (ref 0.3–1.2)
Total Protein: 6.8 g/dL (ref 6.5–8.1)

## 2020-10-13 LAB — CEA (IN HOUSE-CHCC): CEA (CHCC-In House): 3.64 ng/mL (ref 0.00–5.00)

## 2020-10-13 LAB — CBC WITH DIFFERENTIAL (CANCER CENTER ONLY)
Abs Immature Granulocytes: 0.01 10*3/uL (ref 0.00–0.07)
Basophils Absolute: 0.1 10*3/uL (ref 0.0–0.1)
Basophils Relative: 1 %
Eosinophils Absolute: 0.2 10*3/uL (ref 0.0–0.5)
Eosinophils Relative: 3 %
HCT: 40.7 % (ref 39.0–52.0)
Hemoglobin: 13.8 g/dL (ref 13.0–17.0)
Immature Granulocytes: 0 %
Lymphocytes Relative: 34 %
Lymphs Abs: 1.8 10*3/uL (ref 0.7–4.0)
MCH: 33.8 pg (ref 26.0–34.0)
MCHC: 33.9 g/dL (ref 30.0–36.0)
MCV: 99.8 fL (ref 80.0–100.0)
Monocytes Absolute: 0.5 10*3/uL (ref 0.1–1.0)
Monocytes Relative: 10 %
Neutro Abs: 2.7 10*3/uL (ref 1.7–7.7)
Neutrophils Relative %: 52 %
Platelet Count: 203 10*3/uL (ref 150–400)
RBC: 4.08 MIL/uL — ABNORMAL LOW (ref 4.22–5.81)
RDW: 13.7 % (ref 11.5–15.5)
WBC Count: 5.1 10*3/uL (ref 4.0–10.5)
nRBC: 0 % (ref 0.0–0.2)

## 2020-10-13 LAB — TOTAL PROTEIN, URINE DIPSTICK: Protein, ur: 100 mg/dL — AB

## 2020-10-13 MED ORDER — SODIUM CHLORIDE 0.9% FLUSH
10.0000 mL | INTRAVENOUS | Status: DC | PRN
  Administered 2020-10-13: 10 mL
  Filled 2020-10-13: qty 10

## 2020-10-13 MED ORDER — BEVACIZUMAB-AWWB CHEMO INJECTION 400 MG/16ML
7.5000 mg/kg | Freq: Once | INTRAVENOUS | Status: AC
Start: 2020-10-13 — End: 2020-10-13
  Administered 2020-10-13: 800 mg via INTRAVENOUS
  Filled 2020-10-13: qty 32

## 2020-10-13 MED ORDER — HEPARIN SOD (PORK) LOCK FLUSH 100 UNIT/ML IV SOLN
500.0000 [IU] | Freq: Once | INTRAVENOUS | Status: AC | PRN
  Administered 2020-10-13: 500 [IU]
  Filled 2020-10-13: qty 5

## 2020-10-13 MED ORDER — SODIUM CHLORIDE 0.9 % IV SOLN
Freq: Once | INTRAVENOUS | Status: AC
Start: 2020-10-13 — End: 2020-10-13
  Filled 2020-10-13: qty 250

## 2020-10-13 MED ORDER — SODIUM CHLORIDE 0.9% FLUSH
10.0000 mL | INTRAVENOUS | Status: DC | PRN
Start: 2020-10-13 — End: 2020-10-13
  Administered 2020-10-13: 10 mL
  Filled 2020-10-13: qty 10

## 2020-10-13 NOTE — Progress Notes (Signed)
Per Cira Rue NP, ok to treat with elevated urine protein.

## 2020-10-17 ENCOUNTER — Telehealth: Payer: Self-pay | Admitting: Hematology

## 2020-10-17 NOTE — Telephone Encounter (Signed)
Left message with follow-up appointments per 5/9 los. Gave option to call back to reschedule if needed.

## 2020-10-27 NOTE — Progress Notes (Incomplete)
[name]   [phone] [phone]   Clinic Follow up Note   Patient Care Team: Dave Macadam, MD as PCP - General (Family Medicine) Dave Forward, MD as Consulting Physician (Cardiology) Dave Artist, MD as Consulting Physician (Gastroenterology) Dave Boston, MD as Consulting Physician (General Surgery) Dave Skates, MD as Consulting Physician (General Surgery) Dave Mons, MD as Consulting Physician (Urology) Dave Merle, MD as Consulting Physician (Medical Oncology)  Date of Service:  10/27/2020  CHIEF COMPLAINT: F/u of colon cancer  SUMMARY OF ONCOLOGIC HISTORY: Oncology History Overview Note  Cancer Staging Cancer of left colon [facility]) Staging form: Colon and Rectum, AJCC 8th Edition - Pathologic stage from 01/11/2018: Stage IIIB (pT3, pN1c, cM0) - Signed by Dave Merle, MD on 01/16/2018     Cancer of left colon ([place])  01/10/2018 Imaging   CT AP W Contrast 01/10/18  IMPRESSION: Irregular soft tissue density causing stricture of the mid descending colon likely the site of obstruction for the dilated small bowel. 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 No evidence of perforation.  Equivocal findings involving the appendix measuring 1.2 cm at the appendiceal tip with mucosal enhancement. No adjacent free fluid or inflammatory change. Findings are nonspecific, but can be seen with early acute appendicitis.  Mild prostatic enlargement. Increased density over the posterior bladder base likely due to the large prostatic impression although cannot completely exclude a bladder mass. Urology protocol CT or ultrasound may be helpful for better evaluation.  Mild cholelithiasis.  Stable 1.5 cm cystic structure over the lower pole right kidney likely slightly hyperdense cyst.  Diverticulosis of the colon.  Aortic Atherosclerosis (ICD10-I70.0).     01/11/2018 Cancer Staging   Staging form: Colon and Rectum, AJCC  8th Edition - Pathologic stage from 01/11/2018: Stage IIIB (pT3, pN1c, cM0) - Signed by Dave Merle, MD on 01/16/2018   01/11/2018 Surgery   LEFT COLON RESECTION, TAKEDOWN SPLENIC FLEXURE, COLOSTOMY by Dr. Dalbert Vaughn    01/11/2018 Procedure   Colonoscopy 01/11/18 by Dr. Lyndel Vaughn  - Malignant completely obstructing tumor in the mid descending colon. Tattooed. - Diverticulosis in the sigmoid colon. - Non-bleeding internal hemorrhoids. - No specimens collected.   01/11/2018 Pathology Results   Diagnosis 01/11/18  1. Colon, segmental resection for tumor, descending colon - INVASIVE COLORECTAL ADENOCARCINOMA, 4 CM. - TUMOR EXTENDS INTO PERICOLONIC CONNECTIVE TISSUE. - TUMOR FOCALLY INVOLVES RADIAL MARGIN. - ONE MESENTERIC TUMOR DEPOSIT. - THIRTEEN BENIGN LYMPH NODES (0/13). 2. Colon, segmental resection, splenic flexure - BENIGN COLON. - NO EVIDENCE OF MALIGNANCY .   01/11/2018 Tumor Marker   Baseline CEA at 3.4   01/16/2018 Initial Diagnosis   Cancer of left colon (Dave Vaughn)   01/23/2018 Imaging   CT CHEST WO CONTRAST IMPRESSION: 1. No evidence for metastatic disease within the chest. 2. Small left pleural effusion with underlying opacities which may represent atelectasis. Right basilar atelectasis. 3. Few foci of gas within the upper abdomen in the omentum with surrounding fat stranding, likely postsurgical 4. Aortic Atherosclerosis (ICD10-I70.0).   03/08/2018 - 05/15/2018 Chemotherapy   adjuvant FOLOFX. Due to side effects of neuropahty Oxaliplatin was stopped after 3 cycles and chemo was stopped after 6 cycles. He declined completing 6 months of chemo treatment.     03/19/2018 Imaging   03/19/2018 CT AP IMPRESSION: 1. Interval partial left hemicolectomy and descending colostomy. 2. Heterogeneous soft tissue density along the left anterior renal fascia is most likely postoperative (favor fat necrosis). No well-defined fluid collection. 3. Mild left lower quadrant  edema, new since 01/10/2018. This  could be postoperative. Superimposed sigmoid diverticulitis and/or cystitis cannot be excluded. 4. Subtle hyperenhancing nodule within the anterior bladder dome cannot be excluded. Consider nonemergent cystoscopy. When this is performed, recommend attention to the left ureterovesicular junction and distal left ureter to evaluate questionable soft tissue fullness. 5. Cholelithiasis. 6.  Aortic Atherosclerosis (ICD10-I70.0). 7. Prostatomegaly.   11/13/2018 Imaging   CT CAP WO Contrast 11/13/18  IMPRESSION: 1. Reversal of left lower quadrant colostomy with sigmoid colon anastomosis. No complicating features. No findings for residual or recurrent tumor or metastatic disease involving the chest, abdomen or pelvis without contrast. 2. No acute abdominal/pelvic findings. 3. Gallbladder sludge and gallstones but no findings for acute cholecystitis. 4. Stable anterior abdominal wall hernia. 5. The right testicle is in the right inguinal canal.   10/03/2019 Imaging   CT CAP WO contrast  IMPRESSION: 1. New rounded density interposed between the prostate gland and anterior upper rectal wall could represent adenopathy or local extension of anterior rectal tumor. 2. Marked prostatomegaly, prostate volume 150 cubic cm. 3. Other imaging findings of potential clinical significance: Aortic Atherosclerosis (ICD10-I70.0). Coronary atherosclerosis. Trace right pleural effusion. Suspected cholelithiasis. Nonobstructive left nephrolithiasis. Multilevel lumbar impingement. Bilateral mildly retracted testicles. Hypodense exophytic lesion of the right kidney, most likely to be a cyst.   11/02/2019 Procedure   colonoscopy on 11/02/2019 by Dr. [name] Vaughn showed normal digital rectal exam, 3 polyps in the rectum, descending colon and cecum, and a prior sigmoid: Anastomosis characterized by erythema.  He found an extrinsic nonobstructing medium-sized mass in the proximal rectum about 4 cm in length, no internal  rectal mass.   Diagnosis Surgical [P], colon, cecum, descending, rectal, polyp (3) - TUBULAR ADENOMA (TWO) - NO HIGH GRADE DYSPLASIA OR CARCINOMA. - COLONIC FRAGMENT WITH BENIGN LYMPHOID AGGREGATE.   12/07/2019 Imaging   MRI pelvis IMPRESSION: 1. Masslike area in the rectum suspicious for rectal neoplasm, likely T4b based on the appearance of soft tissue extending along the anterior peritoneal reflection and into the seminal vesicles. Correlation with recent colonoscopy results may be helpful. Area of anastomosis and other areas of the pelvis are not imaged on today's exam.   12/21/2019 PET scan   IMPRESSION: 1. Unfortunately evidence for peritoneal metastasis. Intensely hypermetabolic nodules along the LEFT pericolic gutter. Favor hypermetabolic mass anterior to the rectum to represent serosal implant along the ventral surface of the rectum. 2. local recurrence within the LEFT abdominal wall at site prior colostomy. Intense hypermetabolic thickening of the rectus muscle at this site. 3. Two hypermetabolic hepatic metastasis.   01/15/2020 Relapse/Recurrence   FINAL MICROSCOPIC DIAGNOSIS:   A. SOFT TISSUE, LEFT ABDOMINAL WALL, BIOPSY:  - Adenocarcinoma.  - See comment.   COMMENT:   The morphology is consistent with metastatic colorectal adenocarcinoma.    01/28/2020 -  Chemotherapy   First line FOLFIRI q2weeks starting 01/28/20 for 8 cycles.  -----Bevacizumab added with C2.  -----Changed to maintenance Xeloda 2000 mg twice daily for 2 weeks on/1 week off and bevacizumab 06/02/20. Starting with C3, dose reduce to 1566m BID due to skin toxicity.   05/05/2020 Imaging   IMPRESSION: 1. Improved appearance, with reduced size of the hepatic metastatic lesions and reduced size of the peritoneal tumor implants. 2. Other imaging findings of potential clinical significance: Notable prostatomegaly. Multilevel impingement in the lumbar spine. Dependent density in the gallbladder  possibly from sludge or gallstones. Degenerative glenohumeral arthropathy bilaterally. 3. Aortic atherosclerosis.   08/15/2020 Imaging   CT CAP  IMPRESSION:  Chest Impression:   No evidence of thoracic metastasis   Abdomen / Pelvis Impression:   1. Hepatic metastasis are no longer measurable by CT imaging. 2. Peritoneal nodular metastasis in the LEFT abdomen are decreased in size. 3. No evidence of new peritoneal disease. 4. Thickening and LEFT rectus muscle at site of prior metastasis. No interval change. 5. No evidence of new or progressive colorectal carcinoma.        CURRENT THERAPY:  First line FOLFIRI q2weeks starting 01/28/20 for 8 cycles.Bevacizumabadded with C2.Changedto maintenance Xeloda 2000 mg twice daily for 2 weeks on/1 week off and bevacizumab 06/02/20. C3 dose reduce to 1562m BID due to skin toxicity.  INTERVAL HISTORY: *** SDEMETRE MONACOis here for a follow up. He was last seen by me 2 months ago and seen by NP Lacie in interim. He presents to the clinic alone.    REVIEW OF SYSTEMS:  *** Constitutional: Denies fevers, chills or abnormal weight loss Eyes: Denies blurriness of vision Ears, nose, mouth, throat, and face: Denies mucositis or sore throat Respiratory: Denies cough, dyspnea or wheezes Cardiovascular: Denies palpitation, chest discomfort or lower extremity swelling Gastrointestinal:  Denies nausea, heartburn or change in bowel habits Skin: Denies abnormal skin rashes Lymphatics: Denies new lymphadenopathy or easy bruising Neurological:Denies numbness, tingling or new weaknesses Behavioral/Psych: Mood is stable, no new changes  All other systems were reviewed with the patient and are negative.  MEDICAL HISTORY:  Past Medical History:  Diagnosis Date  . Adenocarcinoma, colon [place]  . Anemia    taking iron supplements  . Anxiety   . Atrial fibrillation with RVR ([place]   . Colonic obstruction ([place] 01/10/2018  . Depression    . Diverticulitis   . Dysrhythmia    afib  . History of kidney stones   . Hypertension     SURGICAL HISTORY: Past Surgical History:  Procedure Laterality Date  . ANKLE SURGERY Left    when he was in college  . COLON RESECTION N/A 01/11/2018   Procedure: LEFT COLON RESECTION, TAKEDOWN SPLENIC FLEXURE, COLOSTOMY;  Surgeon: IFanny Skates MD;  Location: [facility];  Service: General;  Laterality: N/A;  . COLONOSCOPY  01/11/2018   Procedure: COLONOSCOPY;  Surgeon: GJackquline Denmark MD;  Location: [facility];  Service: Endoscopy;;  . COLONOSCOPY  05/10/2018  . colonscopy  05/10/2018  . HERNIA REPAIR    . HERNIA REPAIR  03/02/2019   EXPLORATORY LAPAROTOMY (N/A Abdomen)  . INCISIONAL HERNIA REPAIR N/A 03/02/2019   Procedure: INCISIONAL HERNIA REPAIR , RECTORECTUS VS TAR HERNIA REPAIR;  Surgeon: RRalene Ok MD;  [place]  Service: General;  Laterality: N/A;  . INSERTION OF MESH N/A 03/02/2019   Procedure: Insertion Of Mesh;  Surgeon: RRalene Ok MD;  [place]  Service: General;  Laterality: N/A;  . LAPAROTOMY N/A 03/02/2019   Procedure: EXPLORATORY LAPAROTOMY;  Surgeon: RRalene Ok MD;  [place]  Service: General;  Laterality: N/A;  . LYSIS OF ADHESION N/A 06/12/2018   Procedure: LYSIS OF ADHESIONS;  Surgeon: GMichael Boston MD;  [facility];  Service: General;  Laterality: N/A;  . LYSIS OF ADHESION N/A 03/02/2019   Procedure: Lysis Of Adhesion;  Surgeon: RRalene Ok MD;  [place]  Service: General;  Laterality: N/A;  . PORTACATH PLACEMENT Right 03/07/2018   Procedure: INSERTION PORT-A-CATH RIGHT SUBCLAVIAN;  Surgeon: IFanny Skates MD;  [place]  Service: General;  Laterality: Right;  . PROCTOSCOPY N/A 06/12/2018   Procedure: RIGID  PROCTOSCOPY;  Surgeon: Dave Boston, MD;  [facility];  Service: General;  Laterality: N/A;  . thumb surgery   2018   cyst removal    I have reviewed the social history and family history with the  patient and they are unchanged from previous note.  ALLERGIES:  has No Known Allergies.  MEDICATIONS:  Current Outpatient Medications  Medication Sig Dispense Refill  . apixaban (ELIQUIS) 5 MG TABS tablet Take 1 tablet (5 mg total) by mouth 2 (two) times daily. 60 tablet 0  . b complex vitamins tablet Take 1 tablet by mouth daily.    . capecitabine (XELODA) 500 MG tablet Take 3 tablets (1500 mg total) by mouth every 12 hours. Take within 30 minutes of AM and PM meals. Take for 14 days on, then 7 days off. Repeat every 21 days. 84 tablet 1  . diltiazem (CARDIZEM CD) 360 MG 24 hr capsule Take 1 capsule (360 mg total) by mouth daily. 90 capsule 3  . diphenoxylate-atropine (LOMOTIL) 2.5-0.025 MG tablet Take 1-2 tablets by mouth 4 (four) times daily as needed for diarrhea or loose stools. 60 tablet 1  . docusate sodium (COLACE) 100 MG capsule Take 100 mg by mouth 2 (two) times daily.    [name] levothyroxine (SYNTHROID) 75 MCG tablet Take 1 tablet (75 mcg total) by mouth daily. 90 tablet 1  . lidocaine-prilocaine (EMLA) cream Apply 1 application topically as needed. 30 g 1  . Multiple Vitamins-Iron (MULTIVITAMIN/IRON PO) Take 1 tablet by mouth daily.     . prochlorperazine (COMPAZINE) 10 MG tablet Take 1 tablet (10 mg total) by mouth every 6 (six) hours as needed for nausea or vomiting. 30 tablet 0  . tamsulosin (FLOMAX) 0.4 MG CAPS capsule Take 0.4 mg by mouth 2 (two) times daily.     . urea (CARMOL) 10 % cream Apply topically as needed. 71 g 0   No current facility-administered medications for this visit.    PHYSICAL EXAMINATION: ECOG PERFORMANCE STATUS: {CHL ONC ECOG PS:[id number]}  There were no vitals filed for this visit. There were no vitals filed for this visit. *** GENERAL:alert, no distress and comfortable SKIN: skin color, texture, turgor are normal, no rashes or significant lesions EYES: normal, Conjunctiva are pink and non-injected, sclera clear {OROPHARYNX:no exudate, no erythema  and lips, buccal mucosa, and tongue normal}  NECK: supple, thyroid normal size, non-tender, without nodularity LYMPH:  no palpable lymphadenopathy in the cervical, axillary {or inguinal} LUNGS: clear to auscultation and percussion with normal breathing effort HEART: regular rate & rhythm and no murmurs and no lower extremity edema ABDOMEN:abdomen soft, non-tender and normal bowel sounds Musculoskeletal:no cyanosis of digits and no clubbing  NEURO: alert & oriented x 3 with fluent speech, no focal motor/sensory deficits  LABORATORY DATA:  I have reviewed the data as listed CBC Latest Ref Rng & Units 10/13/2020 09/15/2020 08/25/2020  WBC 4.0 - 10.5 K/uL 5.1 5.6 5.6  Hemoglobin 13.0 - 17.0 g/dL 13.8 14.3 13.6  Hematocrit 39.0 - 52.0 % 40.7 41.0 39.7  Platelets 150 - 400 K/uL 203 178 173     CMP Latest Ref Rng & Units 10/13/2020 09/15/2020 08/25/2020  Glucose 70 - 99 mg/dL 105(H) 91 94  BUN 8 - 23 mg/dL 16 20 15   Creatinine 0.61 - 1.24 mg/dL 1.21 1.23 1.21  Sodium 135 - 145 mmol/L 141 141 139  Potassium 3.5 - 5.1 mmol/L 4.2 4.1 3.9  Chloride 98 - 111 mmol/L 107 106 108  CO2 22 -  32 mmol/L 26 24 25   Calcium 8.9 - 10.3 mg/dL 9.0 9.1 8.9  Total Protein 6.5 - 8.1 g/dL 6.8 7.1 6.8  Total Bilirubin 0.3 - 1.2 mg/dL 0.8 1.1 0.9  Alkaline Phos 38 - 126 U/L 88 79 72  AST 15 - 41 U/L 22 19 20   ALT 0 - 44 U/L 17 13 16       RADIOGRAPHIC STUDIES: I have personally reviewed the radiological images as listed and agreed with the findings in the report. No results found.   ASSESSMENT & Vaughn:  Dave Vaughn is a 75 y.o. male with    1. Cancer of left colon,adenocarcinoma, stage IIIB(pT3N1cM0), MSI-stable, liver and peritoneal recurrence 12/2019 -Diagnosed in 01/2018. Treated with surgery andadjuvantFOLFOX. Due to side effects Oxaliplatin was stopped after 3 cycles and chemo was stopped after 6 cycles. He declined completing 6 months of chemo treatment. -Unfortunately he had local recurrence in  left abdominal wall withperitoneal metastasis,Two hypermetabolic hepatic metastasisin 12/2019.HisUS Biopsyof abdominal wallfrom8/2021 confirmed metastaticcolorectal adenocarcinoma. -His 01/15/20 FO results showKras mutation, MSS, he is not a candidate for EGFR inhibitor or immunotherapy -I started him on first-line FOLFIRI q2weeks on 01/28/20.Bevacizumab (MVASI)was added with C2.He completed 8 cycles before switching to maintenance Xeloda 2046m BID 2 weeks on/1 week off and Bevacizumab on 06/02/20.C3 dose reduce to 15051mBID due to skin toxicity. He has good response in liver on 08/15/20 CT Scan  -I discussed the option of HIPEC surgery given reduced tumor burden and present peritoneal metastasis. I discussed risks and benefits with patient. Due to his liver mets, HIPEC may not be an option.  Will revisit this after next scan if stable.  -He continues to tolerate treatment with mild skin toxicity. Labs reviewed, CBC and CMP WNL. Will proceed with Beva today.  -Will continue Xeloda 150081mID 2 weeks on/1 week off. Start next cycle on today (4/11).  -[place]. Will postpone the start of next cycle until then.  -I discouraged him to drink alcohol during his trip except towards end of trip and to limit himself to 2 drinks a day and encouraged him to use plenty of sunscreen given sun exposure.    2. Comorbidities: Atrial Fibrillation, CKD stage III, BPH, Elevated TSH  -continue Eliquis. Continue follow-up with cardiologyDr HawLyla Sonlso continue to Followed with urologist Dr. WinLovena Neighbourselvic MRI on 7/1 shows marked prostatomegaly with signs of BPH extending into the bladder base -TSH on 02/15/20 at 91.79.He is on Synthroid since 02/16/20.Will continue to monitor.Stable.  3.Goal of care discussion  -The patient understands the goal of care is palliative. -He is full code now  4. Skin toxicity, secondary to Xeloda  -After C2 Xeloda he  developed moderate skin rash of his forearms and sensitivity of his feet.  -More mild with Xeloda dose reduction.I encouraged him to use topical hydrocortisone cream and possibly Urea cream if needed.He can use this on his hands as well.  -I recommend he use mouthwash daily to help prevent mouth sores.   Vaughn -Labs reviewed and adequate to proceed with Bevacizumab today -StartXeloda today at 1500m23mDtoday, for 2 weeks on and one week off. -Lab, flush, F/u and Bevacizumab in 4 weeks (postpone due to his trip). Will start next cycle Xeloda at that time.     No problem-specific Assessment & Vaughn notes found for this encounter.   No orders of the defined types were placed in this encounter.  All questions were answered.  The patient knows to call the clinic with any problems, questions or concerns. No barriers to learning was detected. The total time spent in the appointment was {CHL ONC TIME VISIT - AZMLP:[id number]}.     Joslyn Devon 10/27/2020   Oneal Deputy, am acting as scribe for Dave Merle, MD.   {Add scribe attestation statement}

## 2020-10-29 ENCOUNTER — Telehealth: Payer: Self-pay | Admitting: Hematology

## 2020-10-29 NOTE — Telephone Encounter (Signed)
Pt called in to r/s his appts per 5/25 sch msg. Pt stated that he will be out of town until 6/6. I sent an add on request and told pt I would give him a call back once the appts were r/s.

## 2020-10-29 NOTE — Telephone Encounter (Signed)
R/s appts per 5/25 sch msg. Called pt, no answer. Left msg with appts date and times. Let pt know infusion will be at the [facility] location.

## 2020-10-31 ENCOUNTER — Inpatient Hospital Stay: Payer: Commercial Managed Care - PPO

## 2020-10-31 ENCOUNTER — Inpatient Hospital Stay: Payer: Commercial Managed Care - PPO | Admitting: Hematology

## 2020-11-04 ENCOUNTER — Inpatient Hospital Stay: Payer: Commercial Managed Care - PPO

## 2020-11-05 NOTE — Progress Notes (Signed)
[place]   [phone] Fax:(336) [phone]   Clinic Follow up Note   Patient Care Team: [name], MD as PCP - General (Family Medicine) [name], MD as Consulting Physician (Cardiology) [name], MD as Consulting Physician (Gastroenterology) [name], MD as Consulting Physician (General Surgery) [name], MD as Consulting Physician (General Surgery) [name], MD as Consulting Physician (Urology) [name], MD as Consulting Physician (Medical Oncology)  Date of Service:  11/10/2020  CHIEF COMPLAINT: F/u of colon cancer  SUMMARY OF ONCOLOGIC HISTORY: Oncology History Overview Note  Cancer Staging Cancer of left colon [facility]) Staging form: Colon and Rectum, AJCC 8th Edition - Pathologic stage from 01/11/2018: Stage IIIB (pT3, pN1c, cM0) - Signed by [name], MD on 01/16/2018     Cancer of left colon ([place])  01/10/2018 Imaging   CT AP W Contrast 01/10/18  IMPRESSION: Irregular soft tissue density causing stricture of the mid descending colon likely the site of obstruction for the dilated small bowel. 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 No evidence of perforation.  Equivocal findings involving the appendix measuring 1.2 cm at the appendiceal tip with mucosal enhancement. No adjacent free fluid or inflammatory change. Findings are nonspecific, but can be seen with early acute appendicitis.  Mild prostatic enlargement. Increased density over the posterior bladder base likely due to the large prostatic impression although cannot completely exclude a bladder mass. Urology protocol CT or ultrasound may be helpful for better evaluation.  Mild cholelithiasis.  Stable 1.5 cm cystic structure over the lower pole right kidney likely slightly hyperdense cyst.  Diverticulosis of the colon.  Aortic Atherosclerosis (ICD10-I70.0).     01/11/2018 Cancer Staging   Staging form: Colon and Rectum, AJCC  8th Edition - Pathologic stage from 01/11/2018: Stage IIIB (pT3, pN1c, cM0) - Signed by [name], MD on 01/16/2018   01/11/2018 Surgery   LEFT COLON RESECTION, TAKEDOWN SPLENIC FLEXURE, COLOSTOMY by Dr. [name]    01/11/2018 Procedure   Colonoscopy 01/11/18 by Dr. [name]  - Malignant completely obstructing tumor in the mid descending colon. Tattooed. - Diverticulosis in the sigmoid colon. - Non-bleeding internal hemorrhoids. - No specimens collected.   01/11/2018 Pathology Results   Diagnosis 01/11/18  1. Colon, segmental resection for tumor, descending colon - INVASIVE COLORECTAL ADENOCARCINOMA, 4 CM. - TUMOR EXTENDS INTO PERICOLONIC CONNECTIVE TISSUE. - TUMOR FOCALLY INVOLVES RADIAL MARGIN. - ONE MESENTERIC TUMOR DEPOSIT. - THIRTEEN BENIGN LYMPH NODES (0/13). 2. Colon, segmental resection, splenic flexure - BENIGN COLON. - NO EVIDENCE OF MALIGNANCY .   01/11/2018 Tumor Marker   Baseline CEA at 3.4   01/16/2018 Initial Diagnosis   Cancer of left colon ([place])   01/23/2018 Imaging   CT CHEST WO CONTRAST IMPRESSION: 1. No evidence for metastatic disease within the chest. 2. Small left pleural effusion with underlying opacities which may represent atelectasis. Right basilar atelectasis. 3. Few foci of gas within the upper abdomen in the omentum with surrounding fat stranding, likely postsurgical 4. Aortic Atherosclerosis (ICD10-I70.0).   03/08/2018 - 05/15/2018 Chemotherapy   adjuvant FOLOFX. Due to side effects of neuropahty Oxaliplatin was stopped after 3 cycles and chemo was stopped after 6 cycles. He declined completing 6 months of chemo treatment.     03/19/2018 Imaging   03/19/2018 CT AP IMPRESSION: 1. Interval partial left hemicolectomy and descending colostomy. 2. Heterogeneous soft tissue density along the left anterior renal fascia is most likely postoperative (favor fat necrosis). No well-defined fluid collection. 3. Mild left lower quadrant  edema, new since 01/10/2018. This  could be postoperative. Superimposed sigmoid diverticulitis and/or cystitis cannot be excluded. 4. Subtle hyperenhancing nodule within the anterior bladder dome cannot be excluded. Consider nonemergent cystoscopy. When this is performed, recommend attention to the left ureterovesicular junction and distal left ureter to evaluate questionable soft tissue fullness. 5. Cholelithiasis. 6.  Aortic Atherosclerosis (ICD10-I70.0). 7. Prostatomegaly.   11/13/2018 Imaging   CT CAP WO Contrast 11/13/18  IMPRESSION: 1. Reversal of left lower quadrant colostomy with sigmoid colon anastomosis. No complicating features. No findings for residual or recurrent tumor or metastatic disease involving the chest, abdomen or pelvis without contrast. 2. No acute abdominal/pelvic findings. 3. Gallbladder sludge and gallstones but no findings for acute cholecystitis. 4. Stable anterior abdominal wall hernia. 5. The right testicle is in the right inguinal canal.   10/03/2019 Imaging   CT CAP WO contrast  IMPRESSION: 1. New rounded density interposed between the prostate gland and anterior upper rectal wall could represent adenopathy or local extension of anterior rectal tumor. 2. Marked prostatomegaly, prostate volume 150 cubic cm. 3. Other imaging findings of potential clinical significance: Aortic Atherosclerosis (ICD10-I70.0). Coronary atherosclerosis. Trace right pleural effusion. Suspected cholelithiasis. Nonobstructive left nephrolithiasis. Multilevel lumbar impingement. Bilateral mildly retracted testicles. Hypodense exophytic lesion of the right kidney, most likely to be a cyst.   11/02/2019 Procedure   colonoscopy on 11/02/2019 by Dr. [name] Plan showed normal digital rectal exam, 3 polyps in the rectum, descending colon and cecum, and a prior sigmoid: Anastomosis characterized by erythema.  He found an extrinsic nonobstructing medium-sized mass in the proximal rectum about 4 cm in length, no internal  rectal mass.   Diagnosis Surgical [P], colon, cecum, descending, rectal, polyp (3) - TUBULAR ADENOMA (TWO) - NO HIGH GRADE DYSPLASIA OR CARCINOMA. - COLONIC FRAGMENT WITH BENIGN LYMPHOID AGGREGATE.   12/07/2019 Imaging   MRI pelvis IMPRESSION: 1. Masslike area in the rectum suspicious for rectal neoplasm, likely T4b based on the appearance of soft tissue extending along the anterior peritoneal reflection and into the seminal vesicles. Correlation with recent colonoscopy results may be helpful. Area of anastomosis and other areas of the pelvis are not imaged on today's exam.   12/21/2019 PET scan   IMPRESSION: 1. Unfortunately evidence for peritoneal metastasis. Intensely hypermetabolic nodules along the LEFT pericolic gutter. Favor hypermetabolic mass anterior to the rectum to represent serosal implant along the ventral surface of the rectum. 2. local recurrence within the LEFT abdominal wall at site prior colostomy. Intense hypermetabolic thickening of the rectus muscle at this site. 3. Two hypermetabolic hepatic metastasis.   01/15/2020 Relapse/Recurrence   FINAL MICROSCOPIC DIAGNOSIS:   A. SOFT TISSUE, LEFT ABDOMINAL WALL, BIOPSY:  - Adenocarcinoma.  - See comment.   COMMENT:   The morphology is consistent with metastatic colorectal adenocarcinoma.    01/28/2020 -  Chemotherapy   First line FOLFIRI q2weeks starting 01/28/20 for 8 cycles.  -----Bevacizumab added with C2.  -----Changed to maintenance Xeloda 2000 mg twice daily for 2 weeks on/1 week off and bevacizumab 06/02/20. Starting with C3, dose reduce to 1566m BID due to skin toxicity.   05/05/2020 Imaging   IMPRESSION: 1. Improved appearance, with reduced size of the hepatic metastatic lesions and reduced size of the peritoneal tumor implants. 2. Other imaging findings of potential clinical significance: Notable prostatomegaly. Multilevel impingement in the lumbar spine. Dependent density in the gallbladder  possibly from sludge or gallstones. Degenerative glenohumeral arthropathy bilaterally. 3. Aortic atherosclerosis.   08/15/2020 Imaging   CT CAP  IMPRESSION:  Chest Impression:   No evidence of thoracic metastasis   Abdomen / Pelvis Impression:   1. Hepatic metastasis are no longer measurable by CT imaging. 2. Peritoneal nodular metastasis in the LEFT abdomen are decreased in size. 3. No evidence of new peritoneal disease. 4. Thickening and LEFT rectus muscle at site of prior metastasis. No interval change. 5. No evidence of new or progressive colorectal carcinoma.        CURRENT THERAPY:  First line FOLFIRI q2weeks starting 01/28/20 for 8 cycles.Bevacizumabadded with C2.Changedto maintenance Xeloda 2000 mg twice daily for 2 weeks on/1 week off and bevacizumab 06/02/20. Continue Beva every 3 weeks. C3 dose reduce to 1570m BID due to skin toxicity.  INTERVAL HISTORY:  SDORTHY HUSTEADis here for a follow up. He was last seen by me 09/15/20 and seen by NP Lacie in interim. She presents to the clinic alone. He notes he is doing well. He notes he is eating well and denies pain. He notes his hair has grown back. He is still on Xeloda and takes it 15053mBID 2 weeks on/1 week off and tolerating well. He has mild sensitivity at tips of fingers and bottoms of feet. He notes he started current cycle this morning. He notes he usually has LE more in the left. He notes this is chronic and improves in the morning.    REVIEW OF SYSTEMS:   Constitutional: Denies fevers, chills or abnormal weight loss Eyes: Denies blurriness of vision Ears, nose, mouth, throat, and face: Denies mucositis or sore throat Respiratory: Denies cough, dyspnea or wheezes Cardiovascular: Denies palpitation, chest discomfort or lower extremity swelling Gastrointestinal:  Denies nausea, heartburn or change in bowel habits Skin: Denies abnormal skin rashes Lymphatics: Denies new lymphadenopathy or easy  bruising Neurological:Denies numbness, tingling or new weaknesses Behavioral/Psych: Mood is stable, no new changes  All other systems were reviewed with the patient and are negative.  MEDICAL HISTORY:  Past Medical History:  Diagnosis Date  . Adenocarcinoma, colon (HCO[facility]isdx'd 01/2018  . Anemia    taking iron supplements  . Anxiety   . Atrial fibrillation with RVR ([facility]  . Colonic obstruction ([facility]11[facility]2017  . Depression   . Diverticulitis   . Dysrhythmia    afib  . History of kidney stones   . Hypertension     SURGICAL HISTORY: Past Surgical History:  Procedure Laterality Date  . ANKLE SURGERY Left    when he was in college  . COLON RESECTION N/A 01/11/2018   Procedure: LEFT COLON RESECTION, TAKEDOWN SPLENIC FLEXURE, COLOSTOMY;  Surgeon: InFanny SkatesMD;  Location: [facility];  Service: General;  Laterality: N/A;  . COLONOSCOPY  01/11/2018   Procedure: COLONOSCOPY;  Surgeon: GuJackquline DenmarkMD;  Location: [facility];  Service: Endoscopy;;  . COLONOSCOPY  05/10/2018  . colonscopy  05/10/2018  . HERNIA REPAIR    . HERNIA REPAIR  03/02/2019   EXPLORATORY LAPAROTOMY (N/A Abdomen)  . INCISIONAL HERNIA REPAIR N/A 03/02/2019   Procedure: INCISIONAL HERNIA REPAIR , RECTORECTUS VS TAR HERNIA REPAIR;  Surgeon: RaRalene OkMD;  [facility] Service: General;  Laterality: N/A;  . INSERTION OF MESH N/A 03/02/2019   Procedure: Insertion Of Mesh;  Surgeon: RaRalene OkMD;  [facility] Service: General;  Laterality: N/A;  . LAPAROTOMY N/A 03/02/2019   Procedure: EXPLORATORY LAPAROTOMY;  Surgeon: RaRalene OkMD;  [facility] Service: General;  Laterality: N/A;  . LYSIS OF ADHESION N/A 06/12/2018   Procedure:  LYSIS OF ADHESIONS;  Surgeon: [name], MD;  [facility]: [facility];  Service: General;  Laterality: N/A;  . LYSIS OF ADHESION N/A 03/02/2019   Procedure: Lysis Of Adhesion;  Surgeon: Ralene Ok, MD;  Location: [facility];  Service: General;  Laterality: N/A;  .  PORTACATH PLACEMENT Right 03/07/2018   Procedure: INSERTION PORT-A-CATH RIGHT SUBCLAVIAN;  Surgeon: [name], MD;  Location: [place];  Service: General;  Laterality: Right;  . PROCTOSCOPY N/A 06/12/2018   Procedure: RIGID PROCTOSCOPY;  Surgeon: [name], MD;  Location: [facility];  Service: General;  Laterality: N/A;  . thumb surgery   2018   cyst removal    I have reviewed the social history and family history with the patient and they are unchanged from previous note.  ALLERGIES:  has No Known Allergies.  MEDICATIONS:  Current Outpatient Medications  Medication Sig Dispense Refill  . apixaban (ELIQUIS) 5 MG TABS tablet Take 1 tablet (5 mg total) by mouth 2 (two) times daily. 60 tablet 0  . b complex vitamins tablet Take 1 tablet by mouth daily.    . capecitabine (XELODA) 500 MG tablet Take 3 tablets (1500 mg total) by mouth every 12 hours. Take within 30 minutes of AM and PM meals. Take for 14 days on, then 7 days off. Repeat every 21 days. 84 tablet 1  . diltiazem (CARDIZEM CD) 360 MG 24 hr capsule Take 1 capsule (360 mg total) by mouth daily. 90 capsule 3  . diphenoxylate-atropine (LOMOTIL) 2.5-0.025 MG tablet Take 1-2 tablets by mouth 4 (four) times daily as needed for diarrhea or loose stools. 60 tablet 1  . docusate sodium (COLACE) 100 MG capsule Take 100 mg by mouth 2 (two) times daily.    [name] levothyroxine (SYNTHROID) 75 MCG tablet Take 1 tablet (75 mcg total) by mouth daily. 90 tablet 1  . lidocaine-prilocaine (EMLA) cream Apply 1 application topically as needed. 30 g 1  . Multiple Vitamins-Iron (MULTIVITAMIN/IRON PO) Take 1 tablet by mouth daily.     . prochlorperazine (COMPAZINE) 10 MG tablet Take 1 tablet (10 mg total) by mouth every 6 (six) hours as needed for nausea or vomiting. 30 tablet 0  . tamsulosin (FLOMAX) 0.4 MG CAPS capsule Take 0.4 mg by mouth 2 (two) times daily.     . urea (CARMOL) 10 % cream Apply topically as needed. 71 g 0   No current facility-administered  medications for this visit.    PHYSICAL EXAMINATION: ECOG PERFORMANCE STATUS: 0 - Asymptomatic  Vitals:   11/10/20 1133  BP: (!) 149/86  Pulse: 88  Resp: 17  Temp: 97.6 F (36.4 C)  SpO2: 100%   Filed Weights   11/10/20 1133  Weight: 226 lb 1.6 oz (102.6 kg)    Due to COVID19 we will limit examination to appearance. Patient had no complaints.  GENERAL:alert, no distress and comfortable SKIN: skin color normal, no rashes or significant lesions EYES: normal, Conjunctiva are pink and non-injected, sclera clear  NEURO: alert & oriented x 3 with fluent speech (+) LE edema, L>R   LABORATORY DATA:  I have reviewed the data as listed CBC Latest Ref Rng & Units 11/10/2020 10/13/2020 09/15/2020  WBC 4.0 - 10.5 K/uL 5.3 5.1 5.6  Hemoglobin 13.0 - 17.0 g/dL 13.9 13.8 14.3  Hematocrit 39.0 - 52.0 % 40.3 40.7 41.0  Platelets 150 - 400 K/uL 155 203 178     CMP Latest Ref Rng & Units 11/10/2020 10/13/2020 09/15/2020  Glucose 70 - 99 mg/dL  102(H) 105(H) 91  BUN 8 - 23 mg/dL 27(H) 16 20  Creatinine 0.61 - 1.24 mg/dL 1.16 1.21 1.23  Sodium 135 - 145 mmol/L 142 141 141  Potassium 3.5 - 5.1 mmol/L 4.2 4.2 4.1  Chloride 98 - 111 mmol/L 107 107 106  CO2 22 - 32 mmol/L _0 Calcium 8.9 - 10.3 mg/dL 9.2 9.0 9.1  Total Protein 6.5 - 8.1 g/dL 6.8 6.8 7.1  Total Bilirubin 0.3 - 1.2 mg/dL 0.8 0.8 1.1  Alkaline Phos 38 - 126 U/L 86 88 79  AST 15 - 41 U/L _1 ALT 0 - 44 U/L _2 RADIOGRAPHIC STUDIES: I have personally reviewed the radiological images as listed and agreed with the findings in the report. No results found.   ASSESSMENT & PLAN:  Dave [place] is a 75 y.o. male with    1. Cancer of left colon,adenocarcinoma, stage IIIB(pT3N1cM0), MSI-stable, liver and peritoneal recurrence 12/2019 -Diagnosed in 01/2018. Treated with surgery andadjuvantFOLFOX. Due to side effects Oxaliplatin was stopped after 3 cycles and chemo was stopped after 6 cycles. He declined  completing 6 months of chemo treatment. -Unfortunately he had local recurrence in left abdominal wall withperitoneal metastasis,Two hypermetabolic hepatic metastasisin 12/2019.HisUS Biopsyof abdominal wallfrom8/2021 confirmed metastaticcolorectal adenocarcinoma. -His 01/15/20 FO results showKras mutation, MSS, he is not a candidate for EGFR inhibitor or immunotherapy -I started him on first-line FOLFIRI q2weeks on 01/28/20.Bevacizumab (MVASI)was added with C2.He completed 8 cycles before switching to maintenance Xeloda 2038m BID 2 weeks on/1 week off and Bevacizumab on 06/02/20.C3 dose reduce to 15015mBID due to skin toxicity. He has good response in liver on 08/15/20 CT Scan.  -I discussed the option of HIPEC surgery given reduced tumor burden and present peritoneal metastasis. I discussed risks and benefits with patient. Due to his liver mets, HIPEC may not be an option.  Will revisit this after next scan if stable.  -He continues to tolerate treatment with mild skin toxicity. Labs reviewed, CBC and CMP WNL. Will proceed with Beva today.  -Will continue Xeloda 150041mID 2 weeks on/1 week off. Start next cycle on today (11/10/20).  -Plan to scan him with PET in 3 weeks.  -F/u in 3 weeks with scan results.    2. Comorbidities: Atrial Fibrillation, CKD stage III, BPH, Elevated TSH  -continue Eliquis. Continue follow-up with cardiologyDr HawLyla Sonlso continue to Followed with urologist Dr. WinLovena Neighbourselvic MRI on 7/1 shows marked prostatomegaly with signs of BPH extending into the bladder base -TSH on 02/15/20 at 91.79.He is on Synthroid since 02/16/20.Will continue to monitor.Stable.  3.Goal of care discussion  -The patient understands the goal of care is palliative. -He is full code now  4. Skin toxicity, secondary to Xeloda  -After C2 Xeloda he developed moderate skin rash of his forearms and sensitivity of his feet.  -More mild with Xeloda dose reduction.I encouraged  him to use topical hydrocortisone cream and possibly Urea cream if needed.He can use this on his hands as well.  -I recommend he use mouthwash daily to help prevent mouth sores. -Has only had mild sensitivity in fingertip and bottoms of feet. Stable.    Plan -Labs reviewed and adequate to proceed with Bevacizumab todayat [facility]  -StartXeloda today at 1500m70mDtoday, for 2 weeks on and one week off. I refilled today for next cycle -Lab, flush, F/u and Bevacizumab in 3 weeks with PET scan a few days before.  -Lab, flush,  F/u and Bevacizumab in 6 weeks.   No problem-specific Assessment & Plan notes found for this encounter.   Orders Placed This Encounter  Procedures  . NM PET Image Restag (PS) Skull Base To Thigh    Standing Status:   Future    Standing Expiration Date:   11/10/2021    Order Specific Question:   If indicated for the ordered procedure, I authorize the administration of a radiopharmaceutical per Radiology protocol    Answer:   Yes    Order Specific Question:   Preferred imaging location?    Answer:   [facility]   All questions were answered. The patient knows to call the clinic with any problems, questions or concerns. No barriers to learning was detected. The total time spent in the appointment was 30 minutes.     [name], MD 11/10/2020   I, Joslyn Devon, am acting as scribe for [name], MD.   I have reviewed the above documentation for accuracy and completeness, and I agree with the above.

## 2020-11-10 ENCOUNTER — Other Ambulatory Visit: Payer: Self-pay

## 2020-11-10 ENCOUNTER — Inpatient Hospital Stay: Payer: Commercial Managed Care - PPO

## 2020-11-10 ENCOUNTER — Inpatient Hospital Stay: Payer: Commercial Managed Care - PPO | Attending: Nurse Practitioner

## 2020-11-10 ENCOUNTER — Encounter: Payer: Self-pay | Admitting: Hematology

## 2020-11-10 ENCOUNTER — Inpatient Hospital Stay (HOSPITAL_BASED_OUTPATIENT_CLINIC_OR_DEPARTMENT_OTHER): Payer: Commercial Managed Care - PPO | Admitting: Hematology

## 2020-11-10 VITALS — BP 149/86 | HR 88 | Temp 97.6°F | Resp 17 | Ht 71.0 in | Wt 226.1 lb

## 2020-11-10 DIAGNOSIS — Z5112 Encounter for antineoplastic immunotherapy: Secondary | ICD-10-CM | POA: Insufficient documentation

## 2020-11-10 DIAGNOSIS — N183 Chronic kidney disease, stage 3 unspecified: Secondary | ICD-10-CM | POA: Diagnosis not present

## 2020-11-10 DIAGNOSIS — N400 Enlarged prostate without lower urinary tract symptoms: Secondary | ICD-10-CM | POA: Diagnosis not present

## 2020-11-10 DIAGNOSIS — C787 Secondary malignant neoplasm of liver and intrahepatic bile duct: Secondary | ICD-10-CM | POA: Diagnosis not present

## 2020-11-10 DIAGNOSIS — C186 Malignant neoplasm of descending colon: Secondary | ICD-10-CM | POA: Insufficient documentation

## 2020-11-10 DIAGNOSIS — J90 Pleural effusion, not elsewhere classified: Secondary | ICD-10-CM | POA: Diagnosis not present

## 2020-11-10 DIAGNOSIS — Z95828 Presence of other vascular implants and grafts: Secondary | ICD-10-CM

## 2020-11-10 DIAGNOSIS — C786 Secondary malignant neoplasm of retroperitoneum and peritoneum: Secondary | ICD-10-CM | POA: Insufficient documentation

## 2020-11-10 DIAGNOSIS — Z452 Encounter for adjustment and management of vascular access device: Secondary | ICD-10-CM | POA: Diagnosis not present

## 2020-11-10 DIAGNOSIS — Z79890 Hormone replacement therapy (postmenopausal): Secondary | ICD-10-CM | POA: Insufficient documentation

## 2020-11-10 DIAGNOSIS — I129 Hypertensive chronic kidney disease with stage 1 through stage 4 chronic kidney disease, or unspecified chronic kidney disease: Secondary | ICD-10-CM | POA: Diagnosis not present

## 2020-11-10 DIAGNOSIS — Z7901 Long term (current) use of anticoagulants: Secondary | ICD-10-CM | POA: Insufficient documentation

## 2020-11-10 DIAGNOSIS — I700 Atherosclerosis of aorta: Secondary | ICD-10-CM | POA: Insufficient documentation

## 2020-11-10 DIAGNOSIS — I4891 Unspecified atrial fibrillation: Secondary | ICD-10-CM | POA: Insufficient documentation

## 2020-11-10 DIAGNOSIS — R946 Abnormal results of thyroid function studies: Secondary | ICD-10-CM | POA: Insufficient documentation

## 2020-11-10 LAB — CBC WITH DIFFERENTIAL (CANCER CENTER ONLY)
Abs Immature Granulocytes: 0.01 10*3/uL (ref 0.00–0.07)
Basophils Absolute: 0 10*3/uL (ref 0.0–0.1)
Basophils Relative: 1 %
Eosinophils Absolute: 0.1 10*3/uL (ref 0.0–0.5)
Eosinophils Relative: 2 %
HCT: 40.3 % (ref 39.0–52.0)
Hemoglobin: 13.9 g/dL (ref 13.0–17.0)
Immature Granulocytes: 0 %
Lymphocytes Relative: 31 %
Lymphs Abs: 1.6 10*3/uL (ref 0.7–4.0)
MCH: 34.4 pg — ABNORMAL HIGH (ref 26.0–34.0)
MCHC: 34.5 g/dL (ref 30.0–36.0)
MCV: 99.8 fL (ref 80.0–100.0)
Monocytes Absolute: 0.4 10*3/uL (ref 0.1–1.0)
Monocytes Relative: 8 %
Neutro Abs: 3.1 10*3/uL (ref 1.7–7.7)
Neutrophils Relative %: 58 %
Platelet Count: 155 10*3/uL (ref 150–400)
RBC: 4.04 MIL/uL — ABNORMAL LOW (ref 4.22–5.81)
RDW: 13.9 % (ref 11.5–15.5)
WBC Count: 5.3 10*3/uL (ref 4.0–10.5)
nRBC: 0 % (ref 0.0–0.2)

## 2020-11-10 LAB — CMP (CANCER CENTER ONLY)
ALT: 27 U/L (ref 0–44)
AST: 21 U/L (ref 15–41)
Albumin: 3.8 g/dL (ref 3.5–5.0)
Alkaline Phosphatase: 86 U/L (ref 38–126)
Anion gap: 10 (ref 5–15)
BUN: 27 mg/dL — ABNORMAL HIGH (ref 8–23)
CO2: 25 mmol/L (ref 22–32)
Calcium: 9.2 mg/dL (ref 8.9–10.3)
Chloride: 107 mmol/L (ref 98–111)
Creatinine: 1.16 mg/dL (ref 0.61–1.24)
GFR, Estimated: >60 mL/min (ref >60)
Glucose, Bld: 102 mg/dL — ABNORMAL HIGH (ref 70–99)
Potassium: 4.2 mmol/L (ref 3.5–5.1)
Sodium: 142 mmol/L (ref 135–145)
Total Bilirubin: 0.8 mg/dL (ref 0.3–1.2)
Total Protein: 6.8 g/dL (ref 6.5–8.1)

## 2020-11-10 LAB — CEA (IN HOUSE-CHCC): CEA (CHCC-In House): 3.77 ng/mL (ref 0.00–5.00)

## 2020-11-10 MED ORDER — HEPARIN SOD (PORK) LOCK FLUSH 100 UNIT/ML IV SOLN
500.0000 [IU] | Freq: Once | INTRAVENOUS | Status: AC | PRN
  Administered 2020-11-10: 500 [IU]
  Filled 2020-11-10: qty 5

## 2020-11-10 MED ORDER — SODIUM CHLORIDE 0.9% FLUSH
10.0000 mL | INTRAVENOUS | Status: DC | PRN
  Administered 2020-11-10: 10 mL
  Filled 2020-11-10: qty 10

## 2020-11-10 MED ORDER — CAPECITABINE 500 MG PO TABS: ORAL_TABLET | ORAL | 1 refills | Status: DC

## 2020-11-10 MED ORDER — SODIUM CHLORIDE 0.9 % IV SOLN
7.5000 mg/kg | Freq: Once | INTRAVENOUS | Status: AC
  Administered 2020-11-10: 800 mg via INTRAVENOUS
  Filled 2020-11-10: qty 32

## 2020-11-10 MED ORDER — SODIUM CHLORIDE 0.9 % IV SOLN
Freq: Once | INTRAVENOUS | Status: AC
Start: 2020-11-10 — End: 2020-11-10
  Filled 2020-11-10: qty 250

## 2020-11-10 NOTE — Patient Instructions (Signed)
[facility]  Discharge Instructions: Thank you for choosing [facility] to provide your oncology and hematology care.   If you have a lab appointment with the Cancer Center, please go directly to the Cancer Center and check in at the registration area.   Wear comfortable clothing and clothing appropriate for easy access to any Portacath or PICC line.   We strive to give you quality time with your provider. You may need to reschedule your appointment if you arrive late (15 or more minutes).  Arriving late affects you and other patients whose appointments are after yours.  Also, if you miss three or more appointments without notifying the office, you may be dismissed from the clinic at the provider's discretion.      For prescription refill requests, have your pharmacy contact our office and allow 72 hours for refills to be completed.    Today you received the following chemotherapy and/or immunotherapy agents Avastin      To help prevent nausea and vomiting after your treatment, we encourage you to take your nausea medication as directed.  BELOW ARE SYMPTOMS THAT SHOULD BE REPORTED IMMEDIATELY: *FEVER GREATER THAN 100.4 F (38 C) OR HIGHER *CHILLS OR SWEATING *NAUSEA AND VOMITING THAT IS NOT CONTROLLED WITH YOUR NAUSEA MEDICATION *UNUSUAL SHORTNESS OF BREATH *UNUSUAL BRUISING OR BLEEDING *URINARY PROBLEMS (pain or burning when urinating, or frequent urination) *BOWEL PROBLEMS (unusual diarrhea, constipation, pain near the anus) TENDERNESS IN MOUTH AND THROAT WITH OR WITHOUT PRESENCE OF ULCERS (sore throat, sores in mouth, or a toothache) UNUSUAL RASH, SWELLING OR PAIN  UNUSUAL VAGINAL DISCHARGE OR ITCHING   Items with * indicate a potential emergency and should be followed up as soon as possible or go to the Emergency Department if any problems should occur.  Please show the CHEMOTHERAPY ALERT CARD or IMMUNOTHERAPY ALERT CARD at check-in to the  Emergency Department and triage nurse.  Should you have questions after your visit or need to cancel or reschedule your appointment, please contact [facility]  Dept: [phone]  and follow the prompts.  Office hours are 8:00 a.m. to 4:30 p.m. Monday - Friday. Please note that voicemails left after 4:00 p.m. may not be returned until the following business day.  We are closed weekends and major holidays. You have access to a nurse at all times for urgent questions. Please call the main number to the clinic Dept: [phone] and follow the prompts.   For any non-urgent questions, you may also contact your provider using MyChart. We now offer e-Visits for anyone 18 and older to request care online for non-urgent symptoms. For details visit [email or web address].   Also download the MyChart app! Go to the app store, search "MyChart", open the app, select [facility], and log in with your MyChart username and password.  Due to Covid, a mask is required upon entering the hospital/clinic. If you do not have a mask, one will be given to you upon arrival. For doctor visits, patients may have 1 support person aged 18 or older with them. For treatment visits, patients cannot have anyone with them due to current Covid guidelines and our immunocompromised population.   

## 2020-11-11 ENCOUNTER — Ambulatory Visit: Payer: Commercial Managed Care - PPO | Admitting: Cardiology

## 2020-11-18 ENCOUNTER — Other Ambulatory Visit: Payer: Self-pay | Admitting: Hematology

## 2020-11-18 DIAGNOSIS — C186 Malignant neoplasm of descending colon: Secondary | ICD-10-CM

## 2020-11-24 ENCOUNTER — Ambulatory Visit: Payer: Commercial Managed Care - PPO | Admitting: Hematology

## 2020-11-24 ENCOUNTER — Other Ambulatory Visit: Payer: Self-pay

## 2020-11-24 ENCOUNTER — Encounter: Payer: Self-pay | Admitting: Family Medicine

## 2020-11-24 ENCOUNTER — Ambulatory Visit: Payer: Commercial Managed Care - PPO

## 2020-11-24 ENCOUNTER — Ambulatory Visit (INDEPENDENT_AMBULATORY_CARE_PROVIDER_SITE_OTHER): Payer: Commercial Managed Care - PPO | Admitting: Family Medicine

## 2020-11-24 ENCOUNTER — Other Ambulatory Visit: Payer: Commercial Managed Care - PPO

## 2020-11-24 ENCOUNTER — Ambulatory Visit: Payer: Commercial Managed Care - PPO | Admitting: Nurse Practitioner

## 2020-11-24 VITALS — BP 117/78 | HR 67 | Temp 98.5°F | Ht 71.0 in | Wt 221.0 lb

## 2020-11-24 DIAGNOSIS — Z13220 Encounter for screening for lipoid disorders: Secondary | ICD-10-CM

## 2020-11-24 DIAGNOSIS — I482 Chronic atrial fibrillation, unspecified: Secondary | ICD-10-CM

## 2020-11-24 DIAGNOSIS — Z1159 Encounter for screening for other viral diseases: Secondary | ICD-10-CM

## 2020-11-24 DIAGNOSIS — C186 Malignant neoplasm of descending colon: Secondary | ICD-10-CM

## 2020-11-24 DIAGNOSIS — Z7901 Long term (current) use of anticoagulants: Secondary | ICD-10-CM

## 2020-11-24 DIAGNOSIS — N1831 Chronic kidney disease, stage 3a: Secondary | ICD-10-CM | POA: Diagnosis not present

## 2020-11-24 DIAGNOSIS — E039 Hypothyroidism, unspecified: Secondary | ICD-10-CM

## 2020-11-24 DIAGNOSIS — R739 Hyperglycemia, unspecified: Secondary | ICD-10-CM

## 2020-11-24 NOTE — Progress Notes (Addendum)
Dave Vaughn DOB: [birth year] Encounter date: 11/24/2020  This is a 75 y.o. 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 Currently in planning for follow up scans - CT Thursday.   Energy level is good.   Chronic a fib: Cardizem 360 mg, Eliquis 5 mg. No shortness of breath, no palpitations.   Ckd:has been stable; regular bloodwork through oncology  Hypothyroid: Synthroid 75 mcg daily  Did complete third covid vaccination; uncertain date.   No issues with urination.   Working on losing weight - cutting out lunch most days.   No Known Allergies Current Meds  Medication Sig   b complex vitamins tablet Take 1 tablet by mouth daily.   capecitabine (XELODA) 500 MG tablet Take 3 tablets (1500 mg total) by mouth every 12 hours. Take within 30 minutes of AM and PM meals. Take for 14 days on, then 7 days off. Repeat every 21 days.   diltiazem (CARDIZEM CD) 360 MG 24 hr capsule Take 1 capsule (360 mg total) by mouth daily.   diphenoxylate-atropine (LOMOTIL) 2.5-0.025 MG tablet Take 1-2 tablets by mouth 4 (four) times daily as needed for diarrhea or loose stools.   levothyroxine (SYNTHROID) 75 MCG tablet Take 1 tablet (75 mcg total) by mouth daily.   lidocaine-prilocaine (EMLA) cream Apply 1 application topically as needed.   Multiple Vitamins-Iron (MULTIVITAMIN/IRON PO) Take 1 tablet by mouth daily.    prochlorperazine (COMPAZINE) 10 MG tablet Take 1 tablet (10 mg total) by mouth every 6 (six) hours as needed for nausea or vomiting.   tamsulosin (FLOMAX) 0.4 MG CAPS capsule Take 0.4 mg by mouth 2 (two) times daily.    urea (CARMOL) 10 % cream Apply topically as needed.    Review of Systems  Constitutional:  Negative for chills, fatigue and fever.  Respiratory:  Negative for cough, chest tightness, shortness of breath and wheezing.    Cardiovascular:  Negative for chest pain, palpitations and leg swelling.  Genitourinary:  Negative for difficulty urinating and dysuria.  Psychiatric/Behavioral:  The patient is not nervous/anxious.        Feeling well overall. Working full time. Just got back from great vacation in [place].   Objective:  BP 140/62   Pulse 67   Temp 98.5 F (36.9 C) (Oral)   Ht 5\' 11"  (1.803 m)   Wt 221 lb (100.2 kg)   SpO2 98%   BMI 30.82 kg/m   Weight: 221 lb (100.2 kg)   BP Readings from Last 3 Encounters:  11/24/20 140/62  11/10/20 (!) 149/86  10/13/20 134/89   Wt Readings from Last 3 Encounters:  11/24/20 221 lb (100.2 kg)  11/10/20 226 lb 1.6 oz (102.6 kg)  10/13/20 227 lb 8 oz (103.2 kg)    Physical Exam Constitutional:      General: He is not in acute distress.    Appearance: He is well-developed.  Cardiovascular:     Rate and Rhythm: Normal rate and regular rhythm.     Heart sounds: Normal heart sounds. No murmur heard.   No friction rub.  Pulmonary:     Effort: Pulmonary effort is normal. No respiratory distress.     Breath sounds: Normal breath sounds. No wheezing or rales.  Musculoskeletal:     Right lower leg: 1+ Pitting Edema present.     Left lower  leg: 1+ Pitting Edema present.  Neurological:     Mental Status: He is alert and oriented to person, place, and time.  Psychiatric:        Behavior: Behavior normal.    Assessment/Plan  1. Chronic atrial fibrillation Rate controlled; continue with current medication.   2. Stage 3a chronic kidney disease ([place]) Kidney function has been stable. Getting regular CMP through oncology.   3. Cancer of left colon [facility]) Following with oncology; CT this week. Doing very well with treatments.   4. Chronic anticoagulation On eliquis for a fib. No side effects of this medication.   5. Acquired hypothyroidism Did order tsh to be rechecked. I have asked him to see if he can get my bloodwork done through cancer center at next  visit.   Return in about 6 months (around 05/26/2021) for physical exam.  Patient declines tdap, shingles, pneumonia vaccinations.      Micheline Rough, MD

## 2020-11-26 NOTE — Progress Notes (Signed)
[facility]   Telephone:(336) [phone] Fax:(336) [phone]   Clinic Follow up Note   Patient Care Team: [name], MD as PCP - General (Family Medicine) [name], MD as Consulting Physician (Cardiology) [name], MD as Consulting Physician (Gastroenterology) [name], MD as Consulting Physician (General Surgery) [name], MD as Consulting Physician (General Surgery) [name], MD as Consulting Physician (Urology) [name], MD as Consulting Physician (Medical Oncology)  Date of Service:  12/01/2020  CHIEF COMPLAINT: F/u of colon cancer    SUMMARY OF ONCOLOGIC HISTORY: Oncology History Overview Note  Cancer Staging Cancer of left colon [facility]) Staging form: Colon and Rectum, AJCC 8th Edition - Pathologic stage from 01/11/2018: Stage IIIB (pT3, pN1c, cM0) - Signed by [name], MD on 01/16/2018      Cancer of left colon ([place])  01/10/2018 Imaging   CT AP W Contrast 01/10/18  IMPRESSION: Irregular soft tissue density causing stricture of the mid descending colon likely the site of obstruction for the dilated small bowel. 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 No evidence of perforation.   Equivocal findings involving the appendix measuring 1.2 cm at the appendiceal tip with mucosal enhancement. No adjacent free fluid or inflammatory change. Findings are nonspecific, but can be seen with early acute appendicitis.   Mild prostatic enlargement. Increased density over the posterior bladder base likely due to the large prostatic impression although cannot completely exclude a bladder mass. Urology protocol CT or ultrasound may be helpful for better evaluation.   Mild cholelithiasis.   Stable 1.5 cm cystic structure over the lower pole right kidney likely slightly hyperdense cyst.   Diverticulosis of the colon.   Aortic Atherosclerosis (ICD10-I70.0).      01/11/2018 Cancer Staging   Staging form: Colon and Rectum,  AJCC 8th Edition - Pathologic stage from 01/11/2018: Stage IIIB (pT3, pN1c, cM0) - Signed by [name], MD on 01/16/2018    01/11/2018 Surgery   LEFT COLON RESECTION, TAKEDOWN SPLENIC FLEXURE, COLOSTOMY by Dr. [name]     01/11/2018 Procedure   Colonoscopy 01/11/18 by Dr. [name]  - Malignant completely obstructing tumor in the mid descending colon. Tattooed. - Diverticulosis in the sigmoid colon. - Non-bleeding internal hemorrhoids. - No specimens collected.    01/11/2018 Pathology Results   Diagnosis 01/11/18  1. Colon, segmental resection for tumor, descending colon - INVASIVE COLORECTAL ADENOCARCINOMA, 4 CM. - TUMOR EXTENDS INTO PERICOLONIC CONNECTIVE TISSUE. - TUMOR FOCALLY INVOLVES RADIAL MARGIN. - ONE MESENTERIC TUMOR DEPOSIT. - THIRTEEN BENIGN LYMPH NODES (0/13). 2. Colon, segmental resection, splenic flexure - BENIGN COLON. - NO EVIDENCE OF MALIGNANCY .    01/11/2018 Tumor Marker   Baseline CEA at 3.4    01/16/2018 Initial Diagnosis   Cancer of left colon ([place])    01/23/2018 Imaging   CT CHEST WO CONTRAST IMPRESSION: 1. No evidence for metastatic disease within the chest. 2. Small left pleural effusion with underlying opacities which may represent atelectasis. Right basilar atelectasis. 3. Few foci of gas within the upper abdomen in the omentum with surrounding fat stranding, likely postsurgical 4. Aortic Atherosclerosis (ICD10-I70.0).    03/08/2018 - 05/15/2018 Chemotherapy   adjuvant FOLOFX. Due to side effects of neuropahty Oxaliplatin was stopped after 3 cycles and chemo was stopped after 6 cycles. He declined completing 6 months of chemo treatment.     03/19/2018 Imaging   03/19/2018 CT AP IMPRESSION: 1. Interval partial left hemicolectomy and descending colostomy. 2. Heterogeneous soft tissue density along the left anterior renal  fascia is most likely postoperative (favor fat necrosis). No well-defined fluid collection. 3. Mild left lower quadrant edema, new since  01/10/2018. This could be postoperative. Superimposed sigmoid diverticulitis and/or cystitis cannot be excluded. 4. Subtle hyperenhancing nodule within the anterior bladder dome cannot be excluded. Consider nonemergent cystoscopy. When this is performed, recommend attention to the left ureterovesicular junction and distal left ureter to evaluate questionable soft tissue fullness. 5. Cholelithiasis. 6.  Aortic Atherosclerosis (ICD10-I70.0). 7. Prostatomegaly.    11/13/2018 Imaging   CT CAP WO Contrast 11/13/18  IMPRESSION: 1. Reversal of left lower quadrant colostomy with sigmoid colon anastomosis. No complicating features. No findings for residual or recurrent tumor or metastatic disease involving the chest, abdomen or pelvis without contrast. 2. No acute abdominal/pelvic findings. 3. Gallbladder sludge and gallstones but no findings for acute cholecystitis. 4. Stable anterior abdominal wall hernia. 5. The right testicle is in the right inguinal canal.    10/03/2019 Imaging   CT CAP WO contrast  IMPRESSION: 1. New rounded density interposed between the prostate gland and anterior upper rectal wall could represent adenopathy or local extension of anterior rectal tumor. 2. Marked prostatomegaly, prostate volume 150 cubic cm. 3. Other imaging findings of potential clinical significance: Aortic Atherosclerosis (ICD10-I70.0). Coronary atherosclerosis. Trace right pleural effusion. Suspected cholelithiasis. Nonobstructive left nephrolithiasis. Multilevel lumbar impingement. Bilateral mildly retracted testicles. Hypodense exophytic lesion of the right kidney, most likely to be a cyst.   11/02/2019 Procedure   colonoscopy on 11/02/2019 by Dr. [name] Plan showed normal digital rectal exam, 3 polyps in the rectum, descending colon and cecum, and a prior sigmoid: Anastomosis characterized by erythema.  He found an extrinsic nonobstructing medium-sized mass in the proximal rectum about 4 cm in  length, no internal rectal mass.   Diagnosis Surgical [P], colon, cecum, descending, rectal, polyp (3) - TUBULAR ADENOMA (TWO) - NO HIGH GRADE DYSPLASIA OR CARCINOMA. - COLONIC FRAGMENT WITH BENIGN LYMPHOID AGGREGATE.   12/07/2019 Imaging   MRI pelvis IMPRESSION: 1. Masslike area in the rectum suspicious for rectal neoplasm, likely T4b based on the appearance of soft tissue extending along the anterior peritoneal reflection and into the seminal vesicles. Correlation with recent colonoscopy results may be helpful. Area of anastomosis and other areas of the pelvis are not imaged on today's exam.   12/21/2019 PET scan   IMPRESSION: 1. Unfortunately evidence for peritoneal metastasis. Intensely hypermetabolic nodules along the LEFT pericolic gutter. Favor hypermetabolic mass anterior to the rectum to represent serosal implant along the ventral surface of the rectum. 2. local recurrence within the LEFT abdominal wall at site prior colostomy. Intense hypermetabolic thickening of the rectus muscle at this site. 3. Two hypermetabolic hepatic metastasis.   01/15/2020 Relapse/Recurrence   FINAL MICROSCOPIC DIAGNOSIS:   A. SOFT TISSUE, LEFT ABDOMINAL WALL, BIOPSY:  - Adenocarcinoma.  - See comment.   COMMENT:   The morphology is consistent with metastatic colorectal adenocarcinoma.    01/28/2020 -  Chemotherapy   First line FOLFIRI q2weeks starting 01/28/20 for 8 cycles.  -----Bevacizumab added with C2.  -----Changed to maintenance Xeloda 2000 mg twice daily for 2 weeks on/1 week off and bevacizumab 06/02/20. Starting with C3, dose reduce to 158m BID due to skin toxicity.   05/05/2020 Imaging   IMPRESSION: 1. Improved appearance, with reduced size of the hepatic metastatic lesions and reduced size of the peritoneal tumor implants. 2. Other imaging findings of potential clinical significance: Notable prostatomegaly. Multilevel impingement in the lumbar spine. Dependent density in the  gallbladder possibly from sludge  or gallstones. Degenerative glenohumeral arthropathy bilaterally. 3. Aortic atherosclerosis.   08/15/2020 Imaging   CT CAP  IMPRESSION: Chest Impression:   No evidence of thoracic metastasis   Abdomen / Pelvis Impression:   1. Hepatic metastasis are no longer measurable by CT imaging. 2. Peritoneal nodular metastasis in the LEFT abdomen are decreased in size. 3. No evidence of new peritoneal disease. 4. Thickening and LEFT rectus muscle at site of prior metastasis. No interval change. 5. No evidence of new or progressive colorectal carcinoma.     11/27/2020 Imaging   CT CAP  IMPRESSION: 1. There are multiple small pulmonary nodules in the right upper lobe that are new or enlarged compared to prior examination but measuring 4 mm or smaller, suspicious for pulmonary metastatic disease. 2. Unchanged peritoneal nodule adjacent to the tip of the spleen measuring 1.3 x 1.2 cm. Slightly peritoneal nodule adjacent to the splenic flexure measuring no greater than 6 mm, previously 8 mm. 3. No significant change in previously hypermetabolic soft tissue mass centered about the left lower quadrant colostomy site. 4. Previously established hypermetabolic hepatic metastatic disease remains inapparent by CT. 5. Status post sigmoid colon resection and reanastomosis. 6. Prostatomegaly with thickening of the decompressed urinary bladder, likely secondary to chronic outlet obstruction. 7. Cholelithiasis.   Aortic Atherosclerosis (ICD10-I70.0).      CURRENT THERAPY:  First line FOLFIRI q2weeks starting 01/28/20 for 8 cycles. Bevacizumab added with C2. Changed to maintenance Xeloda 2000 mg twice daily for 2 weeks on/1 week off and bevacizumab 06/02/20. Continue Beva every 3 weeks. C3 dose reduce to 1535m BID due to skin toxicity.   INTERVAL HISTORY:  Dave BRINDISIis here for a follow up. He was last seen by me 11/10/20. He presents to the clinic with his  wife. He notes no new major changes. He notes he is eating adequately and stable skin changes from chemo. He denies nay change in breathing or a cough or recent URI. He started her his current Xeloda cycle today.    REVIEW OF SYSTEMS:   Constitutional: Denies fevers, chills or abnormal weight loss Eyes: Denies blurriness of vision Ears, nose, mouth, throat, and face: Denies mucositis or sore throat Respiratory: Denies cough, dyspnea or wheezes Cardiovascular: Denies palpitation, chest discomfort or lower extremity swelling Gastrointestinal:  Denies nausea, heartburn or change in bowel habits Skin: Denies abnormal skin rashes Lymphatics: Denies new lymphadenopathy or easy bruising Neurological:Denies numbness, tingling or new weaknesses Behavioral/Psych: Mood is stable, no new changes  All other systems were reviewed with the patient and are negative.  MEDICAL HISTORY:  Past Medical History:  Diagnosis Date   Adenocarcinoma, colon ([place] dx'd 01/2018   Anemia    taking iron supplements   Anxiety    Atrial fibrillation with RVR ([facility])    Colonic obstruction ([place] 01/10/2018   Depression    Diverticulitis    Dysrhythmia    afib   History of kidney stones    Hypertension     SURGICAL HISTORY: Past Surgical History:  Procedure Laterality Date   ANKLE SURGERY Left    when he was in college   COLON RESECTION N/A 01/11/2018   Procedure: LEFT COLON RESECTION, TAKEDOWN SPLENIC FLEXURE, COLOSTOMY;  Surgeon: IFanny Skates MD;  Location: [facility];  Service: General;  Laterality: N/A;   COLONOSCOPY  01/11/2018   Procedure: COLONOSCOPY;  Surgeon: GJackquline Denmark MD;  Location: [facility];  Service: Endoscopy;;   COLONOSCOPY  05/10/2018   colonscopy  05/10/2018   HERNIA REPAIR  HERNIA REPAIR  03/02/2019   EXPLORATORY LAPAROTOMY (N/A Abdomen)   INCISIONAL HERNIA REPAIR N/A 03/02/2019   Procedure: INCISIONAL HERNIA REPAIR , RECTORECTUS VS TAR HERNIA REPAIR;  Surgeon: Ralene Ok, MD;   Location: [place];  Service: General;  Laterality: N/A;   INSERTION OF MESH N/A 03/02/2019   Procedure: Insertion Of Mesh;  Surgeon: Ralene Ok, MD;  Location: [place];  Service: General;  Laterality: N/A;   LAPAROTOMY N/A 03/02/2019   Procedure: EXPLORATORY LAPAROTOMY;  Surgeon: Ralene Ok, MD;  Location: [place];  Service: General;  Laterality: N/A;   LYSIS OF ADHESION N/A 06/12/2018   Procedure: LYSIS OF ADHESIONS;  Surgeon: [name], MD;  Location: [facility];  Service: General;  Laterality: N/A;   LYSIS OF ADHESION N/A 03/02/2019   Procedure: Lysis Of Adhesion;  Surgeon: Ralene Ok, MD;  Location: [place];  Service: General;  Laterality: N/A;   PORTACATH PLACEMENT Right 03/07/2018   Procedure: INSERTION PORT-A-CATH RIGHT SUBCLAVIAN;  Surgeon: [name], MD;  Location: [place];  Service: General;  Laterality: Right;   PROCTOSCOPY N/A 06/12/2018   Procedure: RIGID PROCTOSCOPY;  Surgeon: [name], MD;  Location: [facility];  Service: General;  Laterality: N/A;   thumb surgery   2018   cyst removal    I have reviewed the social history and family history with the patient and they are unchanged from previous note.  ALLERGIES:  has No Known Allergies.  MEDICATIONS:  Current Outpatient Medications  Medication Sig Dispense Refill   apixaban (ELIQUIS) 5 MG TABS tablet Take 1 tablet (5 mg total) by mouth 2 (two) times daily. 60 tablet 0   b complex vitamins tablet Take 1 tablet by mouth daily.     capecitabine (XELODA) 500 MG tablet Take 3 tablets (1500 mg total) by mouth every 12 hours. Take within 30 minutes of AM and PM meals. Take for 14 days on, then 7 days off. Repeat every 21 days. 84 tablet 1   diltiazem (CARDIZEM CD) 360 MG 24 hr capsule Take 1 capsule (360 mg total) by mouth daily. 90 capsule 3   diphenoxylate-atropine (LOMOTIL) 2.5-0.025 MG tablet Take 1-2 tablets by mouth 4 (four) times daily as needed for diarrhea or loose stools. 60 tablet 1   docusate sodium (COLACE)  100 MG capsule Take 100 mg by mouth 2 (two) times daily. (Patient not taking: Reported on 11/24/2020)     levothyroxine (SYNTHROID) 75 MCG tablet Take 1 tablet (75 mcg total) by mouth daily. 90 tablet 1   lidocaine-prilocaine (EMLA) cream Apply 1 application topically as needed. 30 g 1   Multiple Vitamins-Iron (MULTIVITAMIN/IRON PO) Take 1 tablet by mouth daily.      prochlorperazine (COMPAZINE) 10 MG tablet Take 1 tablet (10 mg total) by mouth every 6 (six) hours as needed for nausea or vomiting. 30 tablet 0   tamsulosin (FLOMAX) 0.4 MG CAPS capsule Take 0.4 mg by mouth 2 (two) times daily.      urea (CARMOL) 10 % cream Apply topically as needed. 71 g 0   Current Facility-Administered Medications  Medication Dose Route Frequency Provider Last Rate Last Admin   alteplase (CATHFLO ACTIVASE) injection 2 mg  2 mg Intracatheter Once PRN [name], MD        PHYSICAL EXAMINATION: ECOG PERFORMANCE STATUS: 1 - Symptomatic but completely ambulatory  Vitals:   12/01/20 1005  BP: 131/87  Pulse: (!) 48  Resp: 17  Temp: 97.8 F (36.6 C)  SpO2: 100%   Filed  Weights   12/01/20 1005  Weight: 219 lb 14.4 oz (99.7 kg)    Due to COVID19 we will limit examination to appearance. Patient had no complaints.  GENERAL:alert, no distress and comfortable SKIN: skin color normal, no rashes or significant lesions EYES: normal, Conjunctiva are pink and non-injected, sclera clear  NEURO: alert & oriented x 3 with fluent speech   LABORATORY DATA:  I have reviewed the data as listed CBC Latest Ref Rng & Units 12/01/2020 11/10/2020 10/13/2020  WBC 4.0 - 10.5 K/uL 6.6 5.3 5.1  Hemoglobin 13.0 - 17.0 g/dL 14.9 13.9 13.8  Hematocrit 39.0 - 52.0 % 42.9 40.3 40.7  Platelets 150 - 400 K/uL 190 155 203     CMP Latest Ref Rng & Units 12/01/2020 11/10/2020 10/13/2020  Glucose 70 - 99 mg/dL 99 102(H) 105(H)  BUN 8 - 23 mg/dL 18 27(H) 16  Creatinine 0.61 - 1.24 mg/dL 1.21 1.16 1.21  Sodium 135 - 145 mmol/L 140 142 141   Potassium 3.5 - 5.1 mmol/L 4.0 4.2 4.2  Chloride 98 - 111 mmol/L 106 107 107  CO2 22 - 32 mmol/L 25 25 26   Calcium 8.9 - 10.3 mg/dL 9.2 9.2 9.0  Total Protein 6.5 - 8.1 g/dL 7.0 6.8 6.8  Total Bilirubin 0.3 - 1.2 mg/dL 0.9 0.8 0.8  Alkaline Phos 38 - 126 U/L 95 86 88  AST 15 - 41 U/L 18 21 22   ALT 0 - 44 U/L 11 27 17       RADIOGRAPHIC STUDIES: I have personally reviewed the radiological images as listed and agreed with the findings in the report. No results found.   ASSESSMENT & PLAN:  Dave Vaughn is a 75 y.o. male with   1. Cancer of left colon, adenocarcinoma, stage IIIB (pT3N1cM0), MSI-stable, liver and peritoneal recurrence 12/2019 -Diagnosed in 01/2018. Treated with surgery and adjuvant FOLFOX. Due to side effects Oxaliplatin was stopped after 3 cycles and chemo was stopped after 6 cycles. He declined completing 6 months of chemo treatment.  -Unfortunately he had local recurrence in left abdominal wall with peritoneal metastasis, Two hypermetabolic hepatic metastasis in 12/2019. His US Biopsy of abdominal wall from 01/2020 confirmed metastatic colorectal adenocarcinoma.  -His 01/15/20 FO results show Kras mutation, MSS, he is not a candidate for EGFR inhibitor or immunotherapy  -I started him on first-line FOLFIRI q2weeks on 01/28/20. Bevacizumab (MVASI) was added with C2. He completed 8 cycles before switching to maintenance Xeloda 2011m BID 2 weeks on/1 week off and Bevacizumab on 06/02/20. C3 dose reduce to 15059mBID due to skin toxicity.  -I discussed the option of HIPEC surgery given reduced tumor burden and present peritoneal metastasis. I discussed risks and benefits with patient. Due to his liver mets, HIPEC may not be an option.  -I personally reviewed and discussed his CT CAP from 11/27/20 which showed overall stable disease with multiple small pulmonary nodules up to 71m11mn the right upper lobe that are new or enlarged. I discussed the possibility of his small lung nodules  being lung metastasis or inflammatory, I recommend observation for now.  -I discussed with mostly stable disease, we can continue current regimen. I also discussed option of switching to IV FOLFIRI and Beva q2weeks. Will consider as next line therapy.  -Labs reviewed, urine protein 300. I will hold Beva this cycle. Will proceed with start of current Xeloda cycle today. Continue at 1500m51mD 2 weeks on/1 week off.  -F/u in 3 weeks  2. Comorbidities: Atrial Fibrillation, CKD stage III, BPH, Elevated TSH -continue Eliquis. Continue follow-up with cardiology Dr Lyla Son -Also continue to Followed with urologist Dr. Lovena Neighbours. Pelvic MRI on 7/1 shows marked prostatomegaly with signs of BPH extending into the bladder base -TSH on 02/15/20 at 91.79. He is on Synthroid since 02/16/20. Will continue to monitor. Stable.    3. Goal of care discussion -The patient understands the goal of care is palliative. -He is full code now    4. Skin toxicity, secondary to Xeloda   -After C2 Xeloda he developed moderate skin rash of his forearms and sensitivity of his feet. -More mild with Xeloda dose reduction. I encouraged him to use topical hydrocortisone cream and possibly Urea cream if needed. He can use this on his hands as well. -I recommend he use mouthwash daily to help prevent mouth sores.  -Has only had mild sensitivity in fingertip and bottoms of feet.  -Stable.      Plan  -scan reviewed, overall SD -Given elevated urine protein 300, will hold Bevacizumab today  -Start Xeloda today at 1575m BID today, for 2 weeks on and one week off. -Lab, flush, F/u and Bevacizumab in 3 and 6 weeks.   No problem-specific Assessment & Plan notes found for this encounter.   Orders Placed This Encounter  Procedures   SCHEDULING COMMUNICATION INJECTION    Schedule 30 minute port flush appointment    All questions were answered. The patient knows to call the clinic with any problems, questions or concerns. No  barriers to learning was detected. The total time spent in the appointment was 30 minutes.     YTruitt Merle MD 12/01/2020   I, AJoslyn Devon am acting as scribe for YTruitt Merle MD.   I have reviewed the above documentation for accuracy and completeness, and I agree with the above.

## 2020-11-27 ENCOUNTER — Other Ambulatory Visit: Payer: Self-pay

## 2020-11-27 ENCOUNTER — Ambulatory Visit (HOSPITAL_COMMUNITY)
Admission: RE | Admit: 2020-11-27 | Discharge: 2020-11-27 | Disposition: A | Discharge status: Home / Self Care | Payer: Commercial Managed Care - PPO | Source: Ambulatory Visit | Attending: Hematology | Admitting: Hematology

## 2020-11-27 ENCOUNTER — Ambulatory Visit (HOSPITAL_COMMUNITY): Payer: Commercial Managed Care - PPO

## 2020-11-27 DIAGNOSIS — C186 Malignant neoplasm of descending colon: Secondary | ICD-10-CM | POA: Diagnosis not present

## 2020-11-27 MED ORDER — SODIUM CHLORIDE (PF) 0.9 % IJ SOLN
INTRAMUSCULAR | Status: AC
  Filled 2020-11-27: qty 50

## 2020-11-27 MED ORDER — IOHEXOL 300 MG/ML  SOLN
100.0000 mL | Freq: Once | INTRAMUSCULAR | Status: AC | PRN
  Administered 2020-11-27: 100 mL via INTRAVENOUS

## 2020-11-28 ENCOUNTER — Other Ambulatory Visit: Payer: Self-pay

## 2020-11-28 DIAGNOSIS — C186 Malignant neoplasm of descending colon: Secondary | ICD-10-CM

## 2020-12-01 ENCOUNTER — Inpatient Hospital Stay: Payer: Commercial Managed Care - PPO

## 2020-12-01 ENCOUNTER — Encounter: Payer: Self-pay | Admitting: Hematology

## 2020-12-01 ENCOUNTER — Other Ambulatory Visit: Payer: Self-pay

## 2020-12-01 ENCOUNTER — Inpatient Hospital Stay (HOSPITAL_BASED_OUTPATIENT_CLINIC_OR_DEPARTMENT_OTHER): Payer: Commercial Managed Care - PPO | Admitting: Hematology

## 2020-12-01 VITALS — BP 131/87 | HR 48 | Temp 97.8°F | Resp 17 | Wt 219.9 lb

## 2020-12-01 DIAGNOSIS — C186 Malignant neoplasm of descending colon: Secondary | ICD-10-CM

## 2020-12-01 DIAGNOSIS — Z5112 Encounter for antineoplastic immunotherapy: Secondary | ICD-10-CM | POA: Diagnosis not present

## 2020-12-01 DIAGNOSIS — Z95828 Presence of other vascular implants and grafts: Secondary | ICD-10-CM

## 2020-12-01 LAB — CBC WITH DIFFERENTIAL (CANCER CENTER ONLY)
Abs Immature Granulocytes: 0.01 10*3/uL (ref 0.00–0.07)
Basophils Absolute: 0 10*3/uL (ref 0.0–0.1)
Basophils Relative: 1 %
Eosinophils Absolute: 0.2 10*3/uL (ref 0.0–0.5)
Eosinophils Relative: 2 %
HCT: 42.9 % (ref 39.0–52.0)
Hemoglobin: 14.9 g/dL (ref 13.0–17.0)
Immature Granulocytes: 0 %
Lymphocytes Relative: 30 %
Lymphs Abs: 1.9 10*3/uL (ref 0.7–4.0)
MCH: 34.3 pg — ABNORMAL HIGH (ref 26.0–34.0)
MCHC: 34.7 g/dL (ref 30.0–36.0)
MCV: 98.6 fL (ref 80.0–100.0)
Monocytes Absolute: 0.6 10*3/uL (ref 0.1–1.0)
Monocytes Relative: 9 %
Neutro Abs: 3.8 10*3/uL (ref 1.7–7.7)
Neutrophils Relative %: 58 %
Platelet Count: 190 10*3/uL (ref 150–400)
RBC: 4.35 MIL/uL (ref 4.22–5.81)
RDW: 14.3 % (ref 11.5–15.5)
WBC Count: 6.6 10*3/uL (ref 4.0–10.5)
nRBC: 0 % (ref 0.0–0.2)

## 2020-12-01 LAB — CMP (CANCER CENTER ONLY)
ALT: 11 U/L (ref 0–44)
AST: 18 U/L (ref 15–41)
Albumin: 3.8 g/dL (ref 3.5–5.0)
Alkaline Phosphatase: 95 U/L (ref 38–126)
Anion gap: 9 (ref 5–15)
BUN: 18 mg/dL (ref 8–23)
CO2: 25 mmol/L (ref 22–32)
Calcium: 9.2 mg/dL (ref 8.9–10.3)
Chloride: 106 mmol/L (ref 98–111)
Creatinine: 1.21 mg/dL (ref 0.61–1.24)
GFR, Estimated: >60 mL/min (ref >60)
Glucose, Bld: 99 mg/dL (ref 70–99)
Potassium: 4 mmol/L (ref 3.5–5.1)
Sodium: 140 mmol/L (ref 135–145)
Total Bilirubin: 0.9 mg/dL (ref 0.3–1.2)
Total Protein: 7 g/dL (ref 6.5–8.1)

## 2020-12-01 LAB — TOTAL PROTEIN, URINE DIPSTICK: Protein, ur: 300 mg/dL — AB

## 2020-12-01 LAB — CEA (IN HOUSE-CHCC): CEA (CHCC-In House): 4.65 ng/mL (ref 0.00–5.00)

## 2020-12-01 MED ORDER — SODIUM CHLORIDE 0.9% FLUSH
10.0000 mL | INTRAVENOUS | Status: DC | PRN
  Administered 2020-12-01: 10 mL
  Filled 2020-12-01: qty 10

## 2020-12-01 MED ORDER — HEPARIN SOD (PORK) LOCK FLUSH 100 UNIT/ML IV SOLN
500.0000 [IU] | Freq: Once | INTRAVENOUS | Status: AC | PRN
  Administered 2020-12-01: 500 [IU]
  Filled 2020-12-01: qty 5

## 2020-12-01 MED ORDER — ALTEPLASE 2 MG IJ SOLR
2.0000 mg | Freq: Once | INTRAMUSCULAR | Status: DC | PRN
  Filled 2020-12-01: qty 2

## 2020-12-01 NOTE — Progress Notes (Signed)
Per Dr. Burr Medico, no treatment today due to elevated urine protein.

## 2020-12-03 ENCOUNTER — Telehealth: Payer: Self-pay | Admitting: Hematology

## 2020-12-03 NOTE — Telephone Encounter (Signed)
Left message with follow-up appointment per 6/27 los. 

## 2020-12-13 ENCOUNTER — Other Ambulatory Visit: Payer: Self-pay | Admitting: Family Medicine

## 2020-12-13 DIAGNOSIS — E039 Hypothyroidism, unspecified: Secondary | ICD-10-CM

## 2020-12-16 ENCOUNTER — Other Ambulatory Visit: Payer: Self-pay | Admitting: Hematology

## 2020-12-16 DIAGNOSIS — C186 Malignant neoplasm of descending colon: Secondary | ICD-10-CM

## 2020-12-21 NOTE — Progress Notes (Signed)
[place]   [phone] [phone]   Clinic Follow up Note   Patient Care Team: [name], MD as PCP - General (Family Medicine) [name], MD as Consulting Physician (Cardiology) [name], MD as Consulting Physician (Gastroenterology) [name], MD as Consulting Physician (General Surgery) [name], MD as Consulting Physician (General Surgery) [name], MD as Consulting Physician (Urology) [name], MD as Consulting Physician (Medical Oncology)  Date of Service:  12/22/2020  CHIEF COMPLAINT: f/u of colon cancer  SUMMARY OF ONCOLOGIC HISTORY: Oncology History Overview Note  Cancer Staging Cancer of left colon [facility]) Staging form: Colon and Rectum, AJCC 8th Edition - Pathologic stage from 01/11/2018: Stage IIIB (pT3, pN1c, cM0) - Signed by [name], MD on 01/16/2018      Cancer of left colon ([place])  01/10/2018 Imaging   CT AP W Contrast 01/10/18  IMPRESSION: Irregular soft tissue density causing stricture of the mid descending colon likely the site of obstruction for the dilated small bowel. 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 No evidence of perforation.   Equivocal findings involving the appendix measuring 1.2 cm at the appendiceal tip with mucosal enhancement. No adjacent free fluid or inflammatory change. Findings are nonspecific, but can be seen with early acute appendicitis.   Mild prostatic enlargement. Increased density over the posterior bladder base likely due to the large prostatic impression although cannot completely exclude a bladder mass. Urology protocol CT or ultrasound may be helpful for better evaluation.   Mild cholelithiasis.   Stable 1.5 cm cystic structure over the lower pole right kidney likely slightly hyperdense cyst.   Diverticulosis of the colon.   Aortic Atherosclerosis (ICD10-I70.0).      01/11/2018 Cancer Staging   Staging form: Colon and Rectum,  AJCC 8th Edition - Pathologic stage from 01/11/2018: Stage IIIB (pT3, pN1c, cM0) - Signed by [name], MD on 01/16/2018    01/11/2018 Surgery   LEFT COLON RESECTION, TAKEDOWN SPLENIC FLEXURE, COLOSTOMY by Dr. [name]     01/11/2018 Procedure   Colonoscopy 01/11/18 by Dr. [name]  - Malignant completely obstructing tumor in the mid descending colon. Tattooed. - Diverticulosis in the sigmoid colon. - Non-bleeding internal hemorrhoids. - No specimens collected.    01/11/2018 Pathology Results   Diagnosis 01/11/18  1. Colon, segmental resection for tumor, descending colon - INVASIVE COLORECTAL ADENOCARCINOMA, 4 CM. - TUMOR EXTENDS INTO PERICOLONIC CONNECTIVE TISSUE. - TUMOR FOCALLY INVOLVES RADIAL MARGIN. - ONE MESENTERIC TUMOR DEPOSIT. - THIRTEEN BENIGN LYMPH NODES (0/13). 2. Colon, segmental resection, splenic flexure - BENIGN COLON. - NO EVIDENCE OF MALIGNANCY .    01/11/2018 Tumor Marker   Baseline CEA at 3.4    01/16/2018 Initial Diagnosis   Cancer of left colon ([place])    01/23/2018 Imaging   CT CHEST WO CONTRAST IMPRESSION: 1. No evidence for metastatic disease within the chest. 2. Small left pleural effusion with underlying opacities which may represent atelectasis. Right basilar atelectasis. 3. Few foci of gas within the upper abdomen in the omentum with surrounding fat stranding, likely postsurgical 4. Aortic Atherosclerosis (ICD10-I70.0).    03/08/2018 - 05/15/2018 Chemotherapy   adjuvant FOLOFX. Due to side effects of neuropahty Oxaliplatin was stopped after 3 cycles and chemo was stopped after 6 cycles. He declined completing 6 months of chemo treatment.     03/19/2018 Imaging   03/19/2018 CT AP IMPRESSION: 1. Interval partial left hemicolectomy and descending colostomy. 2. Heterogeneous soft tissue density along the left anterior renal fascia is  most likely postoperative (favor fat necrosis). No well-defined fluid collection. 3. Mild left lower quadrant edema, new since  01/10/2018. This could be postoperative. Superimposed sigmoid diverticulitis and/or cystitis cannot be excluded. 4. Subtle hyperenhancing nodule within the anterior bladder dome cannot be excluded. Consider nonemergent cystoscopy. When this is performed, recommend attention to the left ureterovesicular junction and distal left ureter to evaluate questionable soft tissue fullness. 5. Cholelithiasis. 6.  Aortic Atherosclerosis (ICD10-I70.0). 7. Prostatomegaly.    11/13/2018 Imaging   CT CAP WO Contrast 11/13/18  IMPRESSION: 1. Reversal of left lower quadrant colostomy with sigmoid colon anastomosis. No complicating features. No findings for residual or recurrent tumor or metastatic disease involving the chest, abdomen or pelvis without contrast. 2. No acute abdominal/pelvic findings. 3. Gallbladder sludge and gallstones but no findings for acute cholecystitis. 4. Stable anterior abdominal wall hernia. 5. The right testicle is in the right inguinal canal.    10/03/2019 Imaging   CT CAP WO contrast  IMPRESSION: 1. New rounded density interposed between the prostate gland and anterior upper rectal wall could represent adenopathy or local extension of anterior rectal tumor. 2. Marked prostatomegaly, prostate volume 150 cubic cm. 3. Other imaging findings of potential clinical significance: Aortic Atherosclerosis (ICD10-I70.0). Coronary atherosclerosis. Trace right pleural effusion. Suspected cholelithiasis. Nonobstructive left nephrolithiasis. Multilevel lumbar impingement. Bilateral mildly retracted testicles. Hypodense exophytic lesion of the right kidney, most likely to be a cyst.   11/02/2019 Procedure   colonoscopy on 11/02/2019 by Dr. [name] Plan showed normal digital rectal exam, 3 polyps in the rectum, descending colon and cecum, and a prior sigmoid: Anastomosis characterized by erythema.  He found an extrinsic nonobstructing medium-sized mass in the proximal rectum about 4 cm in  length, no internal rectal mass.   Diagnosis Surgical [P], colon, cecum, descending, rectal, polyp (3) - TUBULAR ADENOMA (TWO) - NO HIGH GRADE DYSPLASIA OR CARCINOMA. - COLONIC FRAGMENT WITH BENIGN LYMPHOID AGGREGATE.   12/07/2019 Imaging   MRI pelvis IMPRESSION: 1. Masslike area in the rectum suspicious for rectal neoplasm, likely T4b based on the appearance of soft tissue extending along the anterior peritoneal reflection and into the seminal vesicles. Correlation with recent colonoscopy results may be helpful. Area of anastomosis and other areas of the pelvis are not imaged on today's exam.   12/21/2019 PET scan   IMPRESSION: 1. Unfortunately evidence for peritoneal metastasis. Intensely hypermetabolic nodules along the LEFT pericolic gutter. Favor hypermetabolic mass anterior to the rectum to represent serosal implant along the ventral surface of the rectum. 2. local recurrence within the LEFT abdominal wall at site prior colostomy. Intense hypermetabolic thickening of the rectus muscle at this site. 3. Two hypermetabolic hepatic metastasis.   01/15/2020 Relapse/Recurrence   FINAL MICROSCOPIC DIAGNOSIS:   A. SOFT TISSUE, LEFT ABDOMINAL WALL, BIOPSY:  - Adenocarcinoma.  - See comment.   COMMENT:   The morphology is consistent with metastatic colorectal adenocarcinoma.    01/28/2020 -  Chemotherapy   First line FOLFIRI q2weeks starting 01/28/20 for 8 cycles.  -----Bevacizumab added with C2.  -----Changed to maintenance Xeloda 2000 mg twice daily for 2 weeks on/1 week off and bevacizumab 06/02/20. Starting with C3, dose reduce to 1531m BID due to skin toxicity.   05/05/2020 Imaging   IMPRESSION: 1. Improved appearance, with reduced size of the hepatic metastatic lesions and reduced size of the peritoneal tumor implants. 2. Other imaging findings of potential clinical significance: Notable prostatomegaly. Multilevel impingement in the lumbar spine. Dependent density in the  gallbladder possibly from sludge or gallstones.  Degenerative glenohumeral arthropathy bilaterally. 3. Aortic atherosclerosis.   08/15/2020 Imaging   CT CAP  IMPRESSION: Chest Impression:   No evidence of thoracic metastasis   Abdomen / Pelvis Impression:   1. Hepatic metastasis are no longer measurable by CT imaging. 2. Peritoneal nodular metastasis in the LEFT abdomen are decreased in size. 3. No evidence of new peritoneal disease. 4. Thickening and LEFT rectus muscle at site of prior metastasis. No interval change. 5. No evidence of new or progressive colorectal carcinoma.     11/27/2020 Imaging   CT CAP  IMPRESSION: 1. There are multiple small pulmonary nodules in the right upper lobe that are new or enlarged compared to prior examination but measuring 4 mm or smaller, suspicious for pulmonary metastatic disease. 2. Unchanged peritoneal nodule adjacent to the tip of the spleen measuring 1.3 x 1.2 cm. Slightly peritoneal nodule adjacent to the splenic flexure measuring no greater than 6 mm, previously 8 mm. 3. No significant change in previously hypermetabolic soft tissue mass centered about the left lower quadrant colostomy site. 4. Previously established hypermetabolic hepatic metastatic disease remains inapparent by CT. 5. Status post sigmoid colon resection and reanastomosis. 6. Prostatomegaly with thickening of the decompressed urinary bladder, likely secondary to chronic outlet obstruction. 7. Cholelithiasis.   Aortic Atherosclerosis (ICD10-I70.0).      CURRENT THERAPY:  First line FOLFIRI q2weeks starting 01/28/20 for 8 cycles. Bevacizumab added with C2. Changed to maintenance Xeloda 2000 mg twice daily for 2 weeks on/1 week off and bevacizumab 06/02/20. Continue Beva every 3 weeks. C3 dose reduce to $RemoveB'1500mg'[id number]$  BID due to skin toxicity.   INTERVAL HISTORY:  Dave Vaughn is here for a follow up of colon cancer. He was last seen by me on 12/01/20. She presents to  the clinic alone. He reports improvement to the swelling in his ankles-- he notes he has been elevating his legs more. He notes he is due to begin his next cycle today. He notes his wife is picking up the medication today.   All other systems were reviewed with the patient and are negative.  MEDICAL HISTORY:  Past Medical History:  Diagnosis Date   Adenocarcinoma, colon ([place]) dx'd 01/2018   Anemia    taking iron supplements   Anxiety    Atrial fibrillation with RVR ([facility])    Colonic obstruction ([place]) 01/10/2018   Depression    Diverticulitis    Dysrhythmia    afib   History of kidney stones    Hypertension     SURGICAL HISTORY: Past Surgical History:  Procedure Laterality Date   ANKLE SURGERY Left    when he was in college   COLON RESECTION N/A 01/11/2018   Procedure: LEFT COLON RESECTION, TAKEDOWN SPLENIC FLEXURE, COLOSTOMY;  Surgeon: [name], MD;  Location: [facility];  Service: General;  Laterality: N/A;   COLONOSCOPY  01/11/2018   Procedure: COLONOSCOPY;  Surgeon: Jackquline Denmark, MD;  Location: [facility];  Service: Endoscopy;;   COLONOSCOPY  05/10/2018   colonscopy  05/10/2018   HERNIA REPAIR     HERNIA REPAIR  03/02/2019   EXPLORATORY LAPAROTOMY (N/A Abdomen)   INCISIONAL HERNIA REPAIR N/A 03/02/2019   Procedure: INCISIONAL HERNIA REPAIR , RECTORECTUS VS TAR HERNIA REPAIR;  Surgeon: Ralene Ok, MD;  Location: [place];  Service: General;  Laterality: N/A;   INSERTION OF MESH N/A 03/02/2019   Procedure: Insertion Of Mesh;  Surgeon: Ralene Ok, MD;  Location: [place];  Service: General;  Laterality: N/A;   LAPAROTOMY N/A  03/02/2019   Procedure: EXPLORATORY LAPAROTOMY;  Surgeon: Ralene Ok, MD;  Location: [place];  Service: General;  Laterality: N/A;   LYSIS OF ADHESION N/A 06/12/2018   Procedure: LYSIS OF ADHESIONS;  Surgeon: [name], MD;  Location: [facility];  Service: General;  Laterality: N/A;   LYSIS OF ADHESION N/A 03/02/2019   Procedure: Lysis Of Adhesion;   Surgeon: Ralene Ok, MD;  Location: [place];  Service: General;  Laterality: N/A;   PORTACATH PLACEMENT Right 03/07/2018   Procedure: INSERTION PORT-A-CATH RIGHT SUBCLAVIAN;  Surgeon: [name], MD;  Location: [place];  Service: General;  Laterality: Right;   PROCTOSCOPY N/A 06/12/2018   Procedure: RIGID PROCTOSCOPY;  Surgeon: [name], MD;  Location: [facility];  Service: General;  Laterality: N/A;   thumb surgery   2018   cyst removal    I have reviewed the social history and family history with the patient and they are unchanged from previous note.  ALLERGIES:  has No Known Allergies.  MEDICATIONS:  Current Outpatient Medications  Medication Sig Dispense Refill   apixaban (ELIQUIS) 5 MG TABS tablet Take 1 tablet (5 mg total) by mouth 2 (two) times daily. 60 tablet 0   b complex vitamins tablet Take 1 tablet by mouth daily.     capecitabine (XELODA) 500 MG tablet TAKE 3 TABLETS BY MOUTH  EVERY 12 HOURS WITHIN 30  MINUTES OF A MEAL FOR 14  DAYS ON THEN 7 DAYS OFF 84 tablet 1   diltiazem (CARDIZEM CD) 360 MG 24 hr capsule Take 1 capsule (360 mg total) by mouth daily. 90 capsule 3   diphenoxylate-atropine (LOMOTIL) 2.5-0.025 MG tablet Take 1-2 tablets by mouth 4 (four) times daily as needed for diarrhea or loose stools. 60 tablet 1   docusate sodium (COLACE) 100 MG capsule Take 100 mg by mouth 2 (two) times daily. (Patient not taking: Reported on 11/24/2020)     levothyroxine (SYNTHROID) 75 MCG tablet TAKE 1 TABLET(75 MCG) BY MOUTH DAILY 90 tablet 1   lidocaine-prilocaine (EMLA) cream Apply 1 application topically as needed. 30 g 1   Multiple Vitamins-Iron (MULTIVITAMIN/IRON PO) Take 1 tablet by mouth daily.      prochlorperazine (COMPAZINE) 10 MG tablet Take 1 tablet (10 mg total) by mouth every 6 (six) hours as needed for nausea or vomiting. 30 tablet 0   tamsulosin (FLOMAX) 0.4 MG CAPS capsule Take 0.4 mg by mouth 2 (two) times daily.      urea (CARMOL) 10 % cream Apply topically as  needed. 71 g 0   No current facility-administered medications for this visit.   Facility-Administered Medications Ordered in Other Visits  Medication Dose Route Frequency Provider Last Rate Last Admin   sodium chloride flush (NS) 0.9 % injection 10 mL  10 mL Intracatheter PRN [name], MD   10 mL at 12/22/20 1209    PHYSICAL EXAMINATION: ECOG PERFORMANCE STATUS: 1 - Symptomatic but completely ambulatory  Vitals:   12/22/20 1004  BP: (!) 146/96  Pulse: 84  Resp: 18  Temp: 98.9 F (37.2 C)  SpO2: 98%   Filed Weights   12/22/20 1004  Weight: 216 lb 12.8 oz (98.3 kg)    Due to COVID19 we will limit examination to appearance. Patient had no complaints.  GENERAL:alert, no distress and comfortable SKIN: skin color normal, no rashes or significant lesions EYES: normal, Conjunctiva are pink and non-injected, sclera clear  NEURO: alert & oriented x 3 with fluent speech  LABORATORY DATA:  I have  reviewed the data as listed CBC Latest Ref Rng & Units 12/22/2020 12/01/2020 11/10/2020  WBC 4.0 - 10.5 K/uL 6.3 6.6 5.3  Hemoglobin 13.0 - 17.0 g/dL 14.3 14.9 13.9  Hematocrit 39.0 - 52.0 % 41.6 42.9 40.3  Platelets 150 - 400 K/uL 214 190 155     CMP Latest Ref Rng & Units 12/22/2020 12/01/2020 11/10/2020  Glucose 70 - 99 mg/dL 124(H) 99 102(H)  BUN 8 - 23 mg/dL 21 18 27(H)  Creatinine 0.61 - 1.24 mg/dL 1.23 1.21 1.16  Sodium 135 - 145 mmol/L 144 140 142  Potassium 3.5 - 5.1 mmol/L 3.9 4.0 4.2  Chloride 98 - 111 mmol/L 107 106 107  CO2 22 - 32 mmol/L 27 25 25   Calcium 8.9 - 10.3 mg/dL 9.2 9.2 9.2  Total Protein 6.5 - 8.1 g/dL 6.9 7.0 6.8  Total Bilirubin 0.3 - 1.2 mg/dL 1.0 0.9 0.8  Alkaline Phos 38 - 126 U/L 94 95 86  AST 15 - 41 U/L 16 18 21   ALT 0 - 44 U/L 10 11 27       RADIOGRAPHIC STUDIES: I have personally reviewed the radiological images as listed and agreed with the findings in the report. No results found.   ASSESSMENT & PLAN:  Dave Vaughn is a 75 y.o. male with    1. Cancer of left colon, adenocarcinoma, stage IIIB (pT3N1cM0), MSI-stable, liver and peritoneal recurrence 12/2019 -Diagnosed in 01/2018. Treated with surgery and adjuvant FOLFOX. Due to side effects Oxaliplatin was stopped after 3 cycles and chemo was stopped after 6 cycles. He declined completing 6 months of chemo treatment.  -Unfortunately he had local recurrence in left abdominal wall with peritoneal metastasis, Two hypermetabolic hepatic metastasis in 12/2019. His US Biopsy of abdominal wall from 01/2020 confirmed metastatic colorectal adenocarcinoma.  -His 01/15/20 FO results show Kras mutation, MSS, he is not a candidate for EGFR inhibitor or immunotherapy  -I started him on first-line FOLFIRI q2weeks on 01/28/20. Bevacizumab (MVASI) was added with C2. He completed 8 cycles before switching to maintenance Xeloda 2000mg  BID 2 weeks on/1 week off and Bevacizumab on 06/02/20. C3 dose reduce to 1500mg  BID due to skin toxicity.  -I discussed the option of HIPEC surgery given reduced tumor burden and present peritoneal metastasis. I discussed risks and benefits with patient. Due to his liver mets, HIPEC may not be an option.  -CT CAP 11/27/20 showed overall stable disease with multiple small pulmonary nodules up to 76mm in the right upper lobe that are new or enlarged. I recommended observation for now. -Plan for restaging CT in 02/2021. I will order at next visit. -Labs reviewed, CBC WNL, CBC pending. He was not given a urine cup as ordered, so we will need to obtain this before he can receive treatment.    2. Comorbidities: Atrial Fibrillation, CKD stage III, BPH, Elevated TSH -continue Eliquis. Continue follow-up with cardiology Dr Lyla Son -Also continue to Followed with urologist Dr. Lovena Neighbours. Pelvic MRI on 7/1 shows marked prostatomegaly with signs of BPH extending into the bladder base -TSH on 02/15/20 at 91.79. He is on Synthroid since 02/16/20. Will continue to monitor. Stable.    3. Goal of care  discussion -The patient understands the goal of care is palliative. -He is full code now    4. Skin toxicity, secondary to Xeloda   -After C2 Xeloda he developed moderate skin rash of his forearms and sensitivity of his feet. -More mild with Xeloda dose reduction. I encouraged him to use topical  hydrocortisone cream and possibly Urea cream if needed. He can use this on his hands as well.  -Has only had mild sensitivity in fingertip and bottoms of feet.  -Stable.      Plan  -urine protein 100 today, will proceed with bevacizumab -Start Xeloda today at $Remove'1500mg'[id number]$  BID today, for 2 weeks on and one week off. -Lab, flush, F/u and Bevacizumab in 3 and 6 weeks. -Plan for restaging CT in 02/2021, will order at next visit    No problem-specific Assessment & Plan notes found for this encounter.   No orders of the defined types were placed in this encounter.  All questions were answered. The patient knows to call the clinic with any problems, questions or concerns. No barriers to learning was detected. The total time spent in the appointment was 30 minutes.     [name], MD 12/22/2020   I, Wilburn Mylar, am acting as scribe for [name], MD.   I have reviewed the above documentation for accuracy and completeness, and I agree with the above.

## 2020-12-22 ENCOUNTER — Inpatient Hospital Stay: Payer: Commercial Managed Care - PPO

## 2020-12-22 ENCOUNTER — Other Ambulatory Visit: Payer: Self-pay

## 2020-12-22 ENCOUNTER — Inpatient Hospital Stay: Payer: Commercial Managed Care - PPO | Attending: Nurse Practitioner | Admitting: Hematology

## 2020-12-22 ENCOUNTER — Encounter: Payer: Self-pay | Admitting: Hematology

## 2020-12-22 VITALS — BP 146/96 | HR 84 | Temp 98.9°F | Resp 18 | Ht 71.0 in | Wt 216.8 lb

## 2020-12-22 VITALS — BP 135/81 | HR 66

## 2020-12-22 DIAGNOSIS — I129 Hypertensive chronic kidney disease with stage 1 through stage 4 chronic kidney disease, or unspecified chronic kidney disease: Secondary | ICD-10-CM | POA: Insufficient documentation

## 2020-12-22 DIAGNOSIS — I482 Chronic atrial fibrillation, unspecified: Secondary | ICD-10-CM

## 2020-12-22 DIAGNOSIS — C186 Malignant neoplasm of descending colon: Secondary | ICD-10-CM

## 2020-12-22 DIAGNOSIS — N1831 Chronic kidney disease, stage 3a: Secondary | ICD-10-CM | POA: Diagnosis not present

## 2020-12-22 DIAGNOSIS — C787 Secondary malignant neoplasm of liver and intrahepatic bile duct: Secondary | ICD-10-CM | POA: Diagnosis not present

## 2020-12-22 DIAGNOSIS — N400 Enlarged prostate without lower urinary tract symptoms: Secondary | ICD-10-CM | POA: Diagnosis not present

## 2020-12-22 DIAGNOSIS — I4891 Unspecified atrial fibrillation: Secondary | ICD-10-CM | POA: Diagnosis not present

## 2020-12-22 DIAGNOSIS — Z95828 Presence of other vascular implants and grafts: Secondary | ICD-10-CM

## 2020-12-22 DIAGNOSIS — N183 Chronic kidney disease, stage 3 unspecified: Secondary | ICD-10-CM | POA: Insufficient documentation

## 2020-12-22 DIAGNOSIS — R946 Abnormal results of thyroid function studies: Secondary | ICD-10-CM | POA: Diagnosis not present

## 2020-12-22 DIAGNOSIS — C786 Secondary malignant neoplasm of retroperitoneum and peritoneum: Secondary | ICD-10-CM | POA: Diagnosis not present

## 2020-12-22 DIAGNOSIS — Z5112 Encounter for antineoplastic immunotherapy: Secondary | ICD-10-CM | POA: Insufficient documentation

## 2020-12-22 LAB — CBC WITH DIFFERENTIAL (CANCER CENTER ONLY)
Abs Immature Granulocytes: 0.01 10*3/uL (ref 0.00–0.07)
Basophils Absolute: 0.1 10*3/uL (ref 0.0–0.1)
Basophils Relative: 1 %
Eosinophils Absolute: 0.3 10*3/uL (ref 0.0–0.5)
Eosinophils Relative: 5 %
HCT: 41.6 % (ref 39.0–52.0)
Hemoglobin: 14.3 g/dL (ref 13.0–17.0)
Immature Granulocytes: 0 %
Lymphocytes Relative: 27 %
Lymphs Abs: 1.7 10*3/uL (ref 0.7–4.0)
MCH: 33.7 pg (ref 26.0–34.0)
MCHC: 34.4 g/dL (ref 30.0–36.0)
MCV: 98.1 fL (ref 80.0–100.0)
Monocytes Absolute: 0.5 10*3/uL (ref 0.1–1.0)
Monocytes Relative: 8 %
Neutro Abs: 3.7 10*3/uL (ref 1.7–7.7)
Neutrophils Relative %: 59 %
Platelet Count: 214 10*3/uL (ref 150–400)
RBC: 4.24 MIL/uL (ref 4.22–5.81)
RDW: 14.9 % (ref 11.5–15.5)
WBC Count: 6.3 10*3/uL (ref 4.0–10.5)
nRBC: 0 % (ref 0.0–0.2)

## 2020-12-22 LAB — CMP (CANCER CENTER ONLY)
ALT: 10 U/L (ref 0–44)
AST: 16 U/L (ref 15–41)
Albumin: 3.6 g/dL (ref 3.5–5.0)
Alkaline Phosphatase: 94 U/L (ref 38–126)
Anion gap: 10 (ref 5–15)
BUN: 21 mg/dL (ref 8–23)
CO2: 27 mmol/L (ref 22–32)
Calcium: 9.2 mg/dL (ref 8.9–10.3)
Chloride: 107 mmol/L (ref 98–111)
Creatinine: 1.23 mg/dL (ref 0.61–1.24)
GFR, Estimated: >60 mL/min (ref >60)
Glucose, Bld: 124 mg/dL — ABNORMAL HIGH (ref 70–99)
Potassium: 3.9 mmol/L (ref 3.5–5.1)
Sodium: 144 mmol/L (ref 135–145)
Total Bilirubin: 1 mg/dL (ref 0.3–1.2)
Total Protein: 6.9 g/dL (ref 6.5–8.1)

## 2020-12-22 LAB — TOTAL PROTEIN, URINE DIPSTICK: Protein, ur: 100 mg/dL — AB

## 2020-12-22 LAB — CEA (IN HOUSE-CHCC): CEA (CHCC-In House): 4.6 ng/mL (ref 0.00–5.00)

## 2020-12-22 MED ORDER — SODIUM CHLORIDE 0.9% FLUSH
10.0000 mL | INTRAVENOUS | Status: DC | PRN
  Administered 2020-12-22: 10 mL via INTRAVENOUS
  Filled 2020-12-22: qty 10

## 2020-12-22 MED ORDER — SODIUM CHLORIDE 0.9% FLUSH
10.0000 mL | INTRAVENOUS | Status: DC | PRN
  Administered 2020-12-22: 10 mL
  Filled 2020-12-22: qty 10

## 2020-12-22 MED ORDER — SODIUM CHLORIDE 0.9 % IV SOLN
Freq: Once | INTRAVENOUS | Status: AC
  Filled 2020-12-22: qty 250

## 2020-12-22 MED ORDER — SODIUM CHLORIDE 0.9 % IV SOLN
7.5000 mg/kg | Freq: Once | INTRAVENOUS | Status: AC
  Administered 2020-12-22: 800 mg via INTRAVENOUS
  Filled 2020-12-22: qty 16

## 2020-12-22 MED ORDER — HEPARIN SOD (PORK) LOCK FLUSH 100 UNIT/ML IV SOLN
500.0000 [IU] | Freq: Once | INTRAVENOUS | Status: AC | PRN
  Administered 2020-12-22: 500 [IU]
  Filled 2020-12-22: qty 5

## 2020-12-22 NOTE — Progress Notes (Signed)
Urine protein 100, ok to treat per Dr. Burr Medico.

## 2020-12-22 NOTE — Patient Instructions (Signed)
[facility]  Discharge Instructions: Thank you for choosing [facility] to provide your oncology and hematology care.   If you have a lab appointment with the [facility], please go directly to the [facility] and check in at the registration area.   Wear comfortable clothing and clothing appropriate for easy access to any Portacath or PICC line.   We strive to give you quality time with your provider. You may need to reschedule your appointment if you arrive late (15 or more minutes).  Arriving late affects you and other patients whose appointments are after yours.  Also, if you miss three or more appointments without notifying the office, you may be dismissed from the clinic at the provider's discretion.      For prescription refill requests, have your pharmacy contact our office and allow 72 hours for refills to be completed.    Today you received the following chemotherapy and/or immunotherapy agents - Bevacizumab      To help prevent nausea and vomiting after your treatment, we encourage you to take your nausea medication as directed.  BELOW ARE SYMPTOMS THAT SHOULD BE REPORTED IMMEDIATELY: *FEVER GREATER THAN 100.4 F (38 C) OR HIGHER *CHILLS OR SWEATING *NAUSEA AND VOMITING THAT IS NOT CONTROLLED WITH YOUR NAUSEA MEDICATION *UNUSUAL SHORTNESS OF BREATH *UNUSUAL BRUISING OR BLEEDING *URINARY PROBLEMS (pain or burning when urinating, or frequent urination) *BOWEL PROBLEMS (unusual diarrhea, constipation, pain near the anus) TENDERNESS IN MOUTH AND THROAT WITH OR WITHOUT PRESENCE OF ULCERS (sore throat, sores in mouth, or a toothache) UNUSUAL RASH, SWELLING OR PAIN  UNUSUAL VAGINAL DISCHARGE OR ITCHING   Items with * indicate a potential emergency and should be followed up as soon as possible or go to the Emergency Department if any problems should occur.  Please show the CHEMOTHERAPY ALERT CARD or IMMUNOTHERAPY ALERT CARD at check-in  to the Emergency Department and triage nurse.  Should you have questions after your visit or need to cancel or reschedule your appointment, please contact [facility]  Dept: [phone]  and follow the prompts.  Office hours are 8:00 a.m. to 4:30 p.m. Monday - Friday. Please note that voicemails left after 4:00 p.m. may not be returned until the following business day.  We are closed weekends and major holidays. You have access to a nurse at all times for urgent questions. Please call the main number to the clinic Dept: [phone] and follow the prompts.   For any non-urgent questions, you may also contact your provider using MyChart. We now offer e-Visits for anyone 43 and older to request care online for non-urgent symptoms. For details visit [email or web address].   Also download the MyChart app! Go to the app store, search "MyChart", open the app, select [facility], and log in with your MyChart username and password.  Due to Covid, a mask is required upon entering the hospital/clinic. If you do not have a mask, one will be given to you upon arrival. For doctor visits, patients may have 1 support person aged 77 or older with them. For treatment visits, patients cannot have anyone with them due to current Covid guidelines and our immunocompromised population.   Bevacizumab injection What is this medication? BEVACIZUMAB (be va SIZ yoo mab) is a monoclonal antibody. It is used to treatmany types of cancer. This medicine may be used for other purposes; ask your health care provider orpharmacist if you have questions. COMMON BRAND NAME(S): Avastin, MVASI, Noah Charon  What should I tell my care team before I take this medication? They need to know if you have any of these conditions: diabetes heart disease high blood pressure history of coughing up blood prior anthracycline chemotherapy (e.g., doxorubicin, daunorubicin, epirubicin) recent or ongoing radiation  therapy recent or planning to have surgery stroke an unusual or allergic reaction to bevacizumab, hamster proteins, mouse proteins, other medicines, foods, dyes, or preservatives pregnant or trying to get pregnant breast-feeding How should I use this medication? This medicine is for infusion into a vein. It is given by a health careprofessional in a hospital or clinic setting. Talk to your pediatrician regarding the use of this medicine in children.Special care may be needed. Overdosage: If you think you have taken too much of this medicine contact apoison control center or emergency room at once. NOTE: This medicine is only for you. Do not share this medicine with others. What if I miss a dose? It is important not to miss your dose. Call your doctor or health careprofessional if you are unable to keep an appointment. What may interact with this medication? Interactions are not expected. This list may not describe all possible interactions. Give your health care provider a list of all the medicines, herbs, non-prescription drugs, or dietary supplements you use. Also tell them if you smoke, drink alcohol, or use illegaldrugs. Some items may interact with your medicine. What should I watch for while using this medication? Your condition will be monitored carefully while you are receiving this medicine. You will need important blood work and urine testing done while youare taking this medicine. This medicine may increase your risk to bruise or bleed. Call your doctor orhealth care professional if you notice any unusual bleeding. Before having surgery, talk to your health care provider to make sure it is ok. This drug can increase the risk of poor healing of your surgical site or wound. You will need to stop this drug for 28 days before surgery. After surgery, wait at least 28 days before restarting this drug. Make sure the surgical site or wound is healed enough before restarting this drug. Talk to  your health careprovider if questions. Do not become pregnant while taking this medicine or for 6 months after stopping it. Women should inform their doctor if they wish to become pregnant or think they might be pregnant. There is a potential for serious side effects to an unborn child. Talk to your health care professional or pharmacist for more information. Do not breast-feed an infant while taking this medicine andfor 6 months after the last dose. This medicine has caused ovarian failure in some women. This medicine may interfere with the ability to have a child. You should talk to your doctor orhealth care professional if you are concerned about your fertility. What side effects may I notice from receiving this medication? Side effects that you should report to your doctor or health care professionalas soon as possible: allergic reactions like skin rash, itching or hives, swelling of the face, lips, or tongue chest pain or chest tightness chills coughing up blood high fever seizures severe constipation signs and symptoms of bleeding such as bloody or black, tarry stools; red or dark-brown urine; spitting up blood or brown material that looks like coffee grounds; red spots on the skin; unusual bruising or bleeding from the eye, gums, or nose signs and symptoms of a blood clot such as breathing problems; chest pain; severe, sudden headache; pain, swelling, warmth in the leg signs and  symptoms of a stroke like changes in vision; confusion; trouble speaking or understanding; severe headaches; sudden numbness or weakness of the face, arm or leg; trouble walking; dizziness; loss of balance or coordination stomach pain sweating swelling of legs or ankles vomiting weight gain Side effects that usually do not require medical attention (report to yourdoctor or health care professional if they continue or are bothersome): back pain changes in taste decreased appetite dry  skin nausea tiredness This list may not describe all possible side effects. Call your doctor for medical advice about side effects. You may report side effects to FDA at1-800-FDA-1088. Where should I keep my medication? This drug is given in a hospital or clinic and will not be stored at home. NOTE: This sheet is a summary. It may not cover all possible information. If you have questions about this medicine, talk to your doctor, pharmacist, orhealth care provider.  2022 Elsevier/Gold Standard (2019-03-21 10:50:46)

## 2020-12-26 ENCOUNTER — Telehealth (INDEPENDENT_AMBULATORY_CARE_PROVIDER_SITE_OTHER): Payer: Commercial Managed Care - PPO | Admitting: Internal Medicine

## 2020-12-26 DIAGNOSIS — J0100 Acute maxillary sinusitis, unspecified: Secondary | ICD-10-CM | POA: Diagnosis not present

## 2020-12-26 MED ORDER — AZITHROMYCIN 250 MG PO TABS: ORAL_TABLET | ORAL | 0 refills | Status: AC

## 2020-12-26 NOTE — Progress Notes (Signed)
Virtual Visit via Video Note  I connected with Dave Vaughn on 12/26/20 at  3:00 PM EDT by a video enabled telemedicine application and verified that I am speaking with the correct person using two identifiers.  Location patient: home Location provider: work office Persons participating in the virtual visit: patient, provider  I discussed the limitations of evaluation and management by telemedicine and the availability of in person appointments. The patient expressed understanding and agreed to proceed.   HPI: He is scheduled this visit to discuss URI symptoms.  About 3 days ago he started having nasal congestion and pain over his maxillary sinus.  He has what he describes as "lots of green goop" coming from his nose.  He denies fever, only a mild cough.  He took a home COVID test that was negative.   ROS: Constitutional: Denies fever, chills, diaphoresis, appetite change and fatigue.  HEENT: Denies photophobia, eye pain, redness, mouth sores, trouble swallowing, neck pain, neck stiffness and tinnitus.   Respiratory: Denies SOB, DOE,  chest tightness,  and wheezing.   Cardiovascular: Denies chest pain, palpitations and leg swelling.  Gastrointestinal: Denies nausea, vomiting, abdominal pain, diarrhea, constipation, blood in stool and abdominal distention.  Genitourinary: Denies dysuria, urgency, frequency, hematuria, flank pain and difficulty urinating.  Endocrine: Denies: hot or cold intolerance, sweats, changes in hair or nails, polyuria, polydipsia. Musculoskeletal: Denies myalgias, back pain, joint swelling, arthralgias and gait problem.  Skin: Denies pallor, rash and wound.  Neurological: Denies dizziness, seizures, syncope, weakness, light-headedness, numbness and headaches.  Hematological: Denies adenopathy. 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   Anemia    taking iron supplements   Anxiety    Atrial fibrillation with RVR ([facility] [facility])    Colonic obstruction ([place] [facility]) 01/10/2018   Depression    Diverticulitis    Dysrhythmia    afib   History of kidney stones    Hypertension     Past Surgical History:  Procedure Laterality Date   ANKLE SURGERY Left    when he was in college   COLON RESECTION N/A 01/11/2018   Procedure: LEFT COLON RESECTION, TAKEDOWN SPLENIC FLEXURE, COLOSTOMY;  Surgeon: [name], MD;  Location: [facility];  Service: General;  Laterality: N/A;   COLONOSCOPY  01/11/2018   Procedure: COLONOSCOPY;  Surgeon: Jackquline Denmark, MD;  Location: [facility];  Service: Endoscopy;;   COLONOSCOPY  05/10/2018   colonscopy  05/10/2018   HERNIA REPAIR     HERNIA REPAIR  03/02/2019   EXPLORATORY LAPAROTOMY (N/A Abdomen)   INCISIONAL HERNIA REPAIR N/A 03/02/2019   Procedure: INCISIONAL HERNIA REPAIR , RECTORECTUS VS TAR HERNIA REPAIR;  Surgeon: Ralene Ok, MD;  Location: [facility];  Service: General;  Laterality: N/A;   INSERTION OF MESH N/A 03/02/2019   Procedure: Insertion Of Mesh;  Surgeon: Ralene Ok, MD;  Location: [facility];  Service: General;  Laterality: N/A;   LAPAROTOMY N/A 03/02/2019   Procedure: EXPLORATORY LAPAROTOMY;  Surgeon: Ralene Ok, MD;  Location: [facility];  Service: General;  Laterality: N/A;   LYSIS OF ADHESION N/A 06/12/2018   Procedure: LYSIS OF ADHESIONS;  Surgeon: [name], MD;  Location: [facility];  Service: General;  Laterality: N/A;   LYSIS OF ADHESION N/A 03/02/2019   Procedure: Lysis Of Adhesion;  Surgeon: Ralene Ok, MD;  Location: [facility];  Service:  General;  Laterality: N/A;   PORTACATH PLACEMENT Right 03/07/2018   Procedure: INSERTION PORT-A-CATH RIGHT SUBCLAVIAN;  Surgeon: [name], MD;  Location: [facility];  Service: General;  Laterality: Right;   PROCTOSCOPY N/A 06/12/2018   Procedure: RIGID  PROCTOSCOPY;  Surgeon: [name], MD;  Location: [facility];  Service: General;  Laterality: N/A;   thumb surgery   2018   cyst removal    Family History  Problem Relation Age of Onset   Breast cancer Mother 78       metastatin; recurrence x7   Heart attack Father 14   Cancer Daughter        melanoma, leukemia   Esophageal cancer Neg Hx    Colon cancer Neg Hx    Rectal cancer Neg Hx    Ulcerative colitis Neg Hx     SOCIAL HX:   reports that he has never smoked. He quit smokeless tobacco use about 14 months ago.  His smokeless tobacco use included chew. He reports current alcohol use of about 2.0 standard drinks of alcohol per week. He reports that he does not use drugs.   Current Outpatient Medications:    apixaban (ELIQUIS) 5 MG TABS tablet, Take 1 tablet (5 mg total) by mouth 2 (two) times daily., Disp: 60 tablet, Rfl: 0   azithromycin (ZITHROMAX) 250 MG tablet, Take 2 tablets on day 1, then 1 tablet daily on days 2 through 5, Disp: 6 tablet, Rfl: 0   b complex vitamins tablet, Take 1 tablet by mouth daily., Disp: , Rfl:    capecitabine (XELODA) 500 MG tablet, TAKE 3 TABLETS BY MOUTH  EVERY 12 HOURS WITHIN 30  MINUTES OF A MEAL FOR 14  DAYS ON THEN 7 DAYS OFF, Disp: 84 tablet, Rfl: 1   diltiazem (CARDIZEM CD) 360 MG 24 hr capsule, Take 1 capsule (360 mg total) by mouth daily., Disp: 90 capsule, Rfl: 3   diphenoxylate-atropine (LOMOTIL) 2.5-0.025 MG tablet, Take 1-2 tablets by mouth 4 (four) times daily as needed for diarrhea or loose stools., Disp: 60 tablet, Rfl: 1   docusate sodium (COLACE) 100 MG capsule, Take 100 mg by mouth 2 (two) times daily., Disp: , Rfl:    levothyroxine (SYNTHROID) 75 MCG tablet, TAKE 1 TABLET(75 MCG) BY MOUTH DAILY, Disp: 90 tablet, Rfl: 1   lidocaine-prilocaine (EMLA) cream, Apply 1 application topically as needed., Disp: 30 g, Rfl: 1   Multiple Vitamins-Iron (MULTIVITAMIN/IRON PO), Take 1 tablet by mouth daily. , Disp: , Rfl:    prochlorperazine  (COMPAZINE) 10 MG tablet, Take 1 tablet (10 mg total) by mouth every 6 (six) hours as needed for nausea or vomiting., Disp: 30 tablet, Rfl: 0   tamsulosin (FLOMAX) 0.4 MG CAPS capsule, Take 0.4 mg by mouth 2 (two) times daily. , Disp: , Rfl:    urea (CARMOL) 10 % cream, Apply topically as needed., Disp: 71 g, Rfl: 0  EXAM:   VITALS per patient if applicable: None reported  GENERAL: alert, oriented, sounds congested  HEENT: atraumatic, conjunttiva clear, no obvious abnormalities on inspection of external nose and ears  NECK: normal movements of the head and neck  LUNGS: on inspection no signs of respiratory distress, breathing rate appears normal, no obvious gross increased work of breathing, gasping or wheezing  CV: no obvious cyanosis  MS: moves all visible extremities without noticeable abnormality  PSYCH/NEURO: pleasant and cooperative, no obvious depression or anxiety, speech and thought processing grossly intact  ASSESSMENT AND PLAN:   Acute non-recurrent  maxillary sinusitis  - Plan: azithromycin (ZITHROMAX) 250 MG tablet -Cannot rule out bacterial sinusitis especially given green nasal secretion. -I will send in for Z-Pak, he has also been advised use of over-the-counter pain relievers, decongestants, guaifenesin. -He knows to follow-up with [place] next week if no improvement.     I discussed the assessment and treatment plan with the patient. The patient was provided an opportunity to ask questions and all were answered. The patient agreed with the plan and demonstrated an understanding of the instructions.   The patient was advised to call back or seek an in-person evaluation if the symptoms worsen or if the condition fails to improve as anticipated.    Lelon Frohlich, MD  [facility]

## 2021-01-12 ENCOUNTER — Inpatient Hospital Stay: Payer: Commercial Managed Care - PPO

## 2021-01-12 ENCOUNTER — Other Ambulatory Visit: Payer: Self-pay | Admitting: Hematology

## 2021-01-12 ENCOUNTER — Other Ambulatory Visit: Payer: Self-pay

## 2021-01-12 ENCOUNTER — Encounter: Payer: Self-pay | Admitting: Hematology

## 2021-01-12 ENCOUNTER — Inpatient Hospital Stay: Payer: Commercial Managed Care - PPO | Attending: Nurse Practitioner

## 2021-01-12 ENCOUNTER — Encounter: Payer: Self-pay | Admitting: Oncology

## 2021-01-12 ENCOUNTER — Inpatient Hospital Stay (HOSPITAL_BASED_OUTPATIENT_CLINIC_OR_DEPARTMENT_OTHER): Payer: Commercial Managed Care - PPO | Admitting: Hematology

## 2021-01-12 VITALS — BP 133/80 | HR 69 | Temp 97.9°F | Resp 17 | Wt 215.7 lb

## 2021-01-12 DIAGNOSIS — N183 Chronic kidney disease, stage 3 unspecified: Secondary | ICD-10-CM | POA: Insufficient documentation

## 2021-01-12 DIAGNOSIS — C186 Malignant neoplasm of descending colon: Secondary | ICD-10-CM | POA: Diagnosis not present

## 2021-01-12 DIAGNOSIS — N400 Enlarged prostate without lower urinary tract symptoms: Secondary | ICD-10-CM | POA: Diagnosis not present

## 2021-01-12 DIAGNOSIS — C786 Secondary malignant neoplasm of retroperitoneum and peritoneum: Secondary | ICD-10-CM | POA: Diagnosis not present

## 2021-01-12 DIAGNOSIS — Z452 Encounter for adjustment and management of vascular access device: Secondary | ICD-10-CM | POA: Diagnosis not present

## 2021-01-12 DIAGNOSIS — Z95828 Presence of other vascular implants and grafts: Secondary | ICD-10-CM

## 2021-01-12 DIAGNOSIS — I4891 Unspecified atrial fibrillation: Secondary | ICD-10-CM | POA: Diagnosis not present

## 2021-01-12 DIAGNOSIS — C787 Secondary malignant neoplasm of liver and intrahepatic bile duct: Secondary | ICD-10-CM | POA: Insufficient documentation

## 2021-01-12 DIAGNOSIS — R946 Abnormal results of thyroid function studies: Secondary | ICD-10-CM | POA: Insufficient documentation

## 2021-01-12 DIAGNOSIS — Z5112 Encounter for antineoplastic immunotherapy: Secondary | ICD-10-CM | POA: Diagnosis not present

## 2021-01-12 DIAGNOSIS — I129 Hypertensive chronic kidney disease with stage 1 through stage 4 chronic kidney disease, or unspecified chronic kidney disease: Secondary | ICD-10-CM | POA: Diagnosis not present

## 2021-01-12 LAB — CMP (CANCER CENTER ONLY)
ALT: 15 U/L (ref 0–44)
AST: 19 U/L (ref 15–41)
Albumin: 3.8 g/dL (ref 3.5–5.0)
Alkaline Phosphatase: 94 U/L (ref 38–126)
Anion gap: 8 (ref 5–15)
BUN: 15 mg/dL (ref 8–23)
CO2: 27 mmol/L (ref 22–32)
Calcium: 9.2 mg/dL (ref 8.9–10.3)
Chloride: 106 mmol/L (ref 98–111)
Creatinine: 1.3 mg/dL — ABNORMAL HIGH (ref 0.61–1.24)
GFR, Estimated: 58 mL/min — ABNORMAL LOW (ref >60)
Glucose, Bld: 124 mg/dL — ABNORMAL HIGH (ref 70–99)
Potassium: 4.1 mmol/L (ref 3.5–5.1)
Sodium: 141 mmol/L (ref 135–145)
Total Bilirubin: 1.2 mg/dL (ref 0.3–1.2)
Total Protein: 6.7 g/dL (ref 6.5–8.1)

## 2021-01-12 LAB — CBC WITH DIFFERENTIAL (CANCER CENTER ONLY)
Abs Immature Granulocytes: 0.01 10*3/uL (ref 0.00–0.07)
Basophils Absolute: 0 10*3/uL (ref 0.0–0.1)
Basophils Relative: 1 %
Eosinophils Absolute: 0.2 10*3/uL (ref 0.0–0.5)
Eosinophils Relative: 5 %
HCT: 41.1 % (ref 39.0–52.0)
Hemoglobin: 14.1 g/dL (ref 13.0–17.0)
Immature Granulocytes: 0 %
Lymphocytes Relative: 31 %
Lymphs Abs: 1.6 10*3/uL (ref 0.7–4.0)
MCH: 34 pg (ref 26.0–34.0)
MCHC: 34.3 g/dL (ref 30.0–36.0)
MCV: 99 fL (ref 80.0–100.0)
Monocytes Absolute: 0.5 10*3/uL (ref 0.1–1.0)
Monocytes Relative: 10 %
Neutro Abs: 2.8 10*3/uL (ref 1.7–7.7)
Neutrophils Relative %: 53 %
Platelet Count: 174 10*3/uL (ref 150–400)
RBC: 4.15 MIL/uL — ABNORMAL LOW (ref 4.22–5.81)
RDW: 15.7 % — ABNORMAL HIGH (ref 11.5–15.5)
WBC Count: 5.1 10*3/uL (ref 4.0–10.5)
nRBC: 0 % (ref 0.0–0.2)

## 2021-01-12 LAB — CEA (IN HOUSE-CHCC): CEA (CHCC-In House): 5.4 ng/mL — ABNORMAL HIGH (ref 0.00–5.00)

## 2021-01-12 LAB — TOTAL PROTEIN, URINE DIPSTICK: Protein, ur: 100 mg/dL — AB

## 2021-01-12 MED ORDER — HEPARIN SOD (PORK) LOCK FLUSH 100 UNIT/ML IV SOLN
500.0000 [IU] | Freq: Once | INTRAVENOUS | Status: AC | PRN
  Administered 2021-01-12: 500 [IU]
  Filled 2021-01-12: qty 5

## 2021-01-12 MED ORDER — SODIUM CHLORIDE 0.9% FLUSH
10.0000 mL | INTRAVENOUS | Status: DC | PRN
  Administered 2021-01-12: 10 mL
  Filled 2021-01-12: qty 10

## 2021-01-12 MED ORDER — UREA 10 % EX CREA: TOPICAL_CREAM | CUTANEOUS | 2 refills | Status: DC | PRN

## 2021-01-12 MED ORDER — SODIUM CHLORIDE 0.9 % IV SOLN
7.5000 mg/kg | Freq: Once | INTRAVENOUS | Status: AC
  Administered 2021-01-12: 800 mg via INTRAVENOUS
  Filled 2021-01-12: qty 32

## 2021-01-12 MED ORDER — SODIUM CHLORIDE 0.9 % IV SOLN
Freq: Once | INTRAVENOUS | Status: DC
  Filled 2021-01-12: qty 250

## 2021-01-12 NOTE — Progress Notes (Signed)
Ok to treat with urine protein 100 per Dr Burr Medico.

## 2021-01-12 NOTE — Progress Notes (Signed)
[place]   [phone] [phone]   Clinic Follow up Note   Patient Care Team: [name], MD as PCP - General (Family Medicine) [name], MD as Consulting Physician (Cardiology) [name], MD as Consulting Physician (Gastroenterology) [name], MD as Consulting Physician (General Surgery) [name], MD as Consulting Physician (General Surgery) [name], MD as Consulting Physician (Urology) [name], MD as Consulting Physician (Medical Oncology)  Date of Service:  01/12/2021  CHIEF COMPLAINT: f/u of colon cancer  SUMMARY OF ONCOLOGIC HISTORY: Oncology History Overview Note  Cancer Staging Cancer of left colon [facility]) Staging form: Colon and Rectum, AJCC 8th Edition - Pathologic stage from 01/11/2018: Stage IIIB (pT3, pN1c, cM0) - Signed by [name], MD on 01/16/2018      Cancer of left colon ([place])  01/10/2018 Imaging   CT AP W Contrast 01/10/18  IMPRESSION: Irregular soft tissue density causing stricture of the mid descending colon likely the site of obstruction for the dilated small bowel. 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 No evidence of perforation.   Equivocal findings involving the appendix measuring 1.2 cm at the appendiceal tip with mucosal enhancement. No adjacent free fluid or inflammatory change. Findings are nonspecific, but can be seen with early acute appendicitis.   Mild prostatic enlargement. Increased density over the posterior bladder base likely due to the large prostatic impression although cannot completely exclude a bladder mass. Urology protocol CT or ultrasound may be helpful for better evaluation.   Mild cholelithiasis.   Stable 1.5 cm cystic structure over the lower pole right kidney likely slightly hyperdense cyst.   Diverticulosis of the colon.   Aortic Atherosclerosis (ICD10-I70.0).      01/11/2018 Cancer Staging   Staging form: Colon and Rectum, AJCC  8th Edition - Pathologic stage from 01/11/2018: Stage IIIB (pT3, pN1c, cM0) - Signed by [name], MD on 01/16/2018    01/11/2018 Surgery   LEFT COLON RESECTION, TAKEDOWN SPLENIC FLEXURE, COLOSTOMY by Dr. [name]     01/11/2018 Procedure   Colonoscopy 01/11/18 by Dr. [name]  - Malignant completely obstructing tumor in the mid descending colon. Tattooed. - Diverticulosis in the sigmoid colon. - Non-bleeding internal hemorrhoids. - No specimens collected.    01/11/2018 Pathology Results   Diagnosis 01/11/18  1. Colon, segmental resection for tumor, descending colon - INVASIVE COLORECTAL ADENOCARCINOMA, 4 CM. - TUMOR EXTENDS INTO PERICOLONIC CONNECTIVE TISSUE. - TUMOR FOCALLY INVOLVES RADIAL MARGIN. - ONE MESENTERIC TUMOR DEPOSIT. - THIRTEEN BENIGN LYMPH NODES (0/13). 2. Colon, segmental resection, splenic flexure - BENIGN COLON. - NO EVIDENCE OF MALIGNANCY .    01/11/2018 Tumor Marker   Baseline CEA at 3.4    01/16/2018 Initial Diagnosis   Cancer of left colon ([place])    01/23/2018 Imaging   CT CHEST WO CONTRAST IMPRESSION: 1. No evidence for metastatic disease within the chest. 2. Small left pleural effusion with underlying opacities which may represent atelectasis. Right basilar atelectasis. 3. Few foci of gas within the upper abdomen in the omentum with surrounding fat stranding, likely postsurgical 4. Aortic Atherosclerosis (ICD10-I70.0).    03/08/2018 - 05/15/2018 Chemotherapy   adjuvant FOLOFX. Due to side effects of neuropahty Oxaliplatin was stopped after 3 cycles and chemo was stopped after 6 cycles. He declined completing 6 months of chemo treatment.     03/19/2018 Imaging   03/19/2018 CT AP IMPRESSION: 1. Interval partial left hemicolectomy and descending colostomy. 2. Heterogeneous soft tissue density along the left anterior renal fascia is  most likely postoperative (favor fat necrosis). No well-defined fluid collection. 3. Mild left lower quadrant edema, new since  01/10/2018. This could be postoperative. Superimposed sigmoid diverticulitis and/or cystitis cannot be excluded. 4. Subtle hyperenhancing nodule within the anterior bladder dome cannot be excluded. Consider nonemergent cystoscopy. When this is performed, recommend attention to the left ureterovesicular junction and distal left ureter to evaluate questionable soft tissue fullness. 5. Cholelithiasis. 6.  Aortic Atherosclerosis (ICD10-I70.0). 7. Prostatomegaly.    11/13/2018 Imaging   CT CAP WO Contrast 11/13/18  IMPRESSION: 1. Reversal of left lower quadrant colostomy with sigmoid colon anastomosis. No complicating features. No findings for residual or recurrent tumor or metastatic disease involving the chest, abdomen or pelvis without contrast. 2. No acute abdominal/pelvic findings. 3. Gallbladder sludge and gallstones but no findings for acute cholecystitis. 4. Stable anterior abdominal wall hernia. 5. The right testicle is in the right inguinal canal.    10/03/2019 Imaging   CT CAP WO contrast  IMPRESSION: 1. New rounded density interposed between the prostate gland and anterior upper rectal wall could represent adenopathy or local extension of anterior rectal tumor. 2. Marked prostatomegaly, prostate volume 150 cubic cm. 3. Other imaging findings of potential clinical significance: Aortic Atherosclerosis (ICD10-I70.0). Coronary atherosclerosis. Trace right pleural effusion. Suspected cholelithiasis. Nonobstructive left nephrolithiasis. Multilevel lumbar impingement. Bilateral mildly retracted testicles. Hypodense exophytic lesion of the right kidney, most likely to be a cyst.   11/02/2019 Procedure   colonoscopy on 11/02/2019 by Dr. [name] Plan showed normal digital rectal exam, 3 polyps in the rectum, descending colon and cecum, and a prior sigmoid: Anastomosis characterized by erythema.  He found an extrinsic nonobstructing medium-sized mass in the proximal rectum about 4 cm in  length, no internal rectal mass.   Diagnosis Surgical [P], colon, cecum, descending, rectal, polyp (3) - TUBULAR ADENOMA (TWO) - NO HIGH GRADE DYSPLASIA OR CARCINOMA. - COLONIC FRAGMENT WITH BENIGN LYMPHOID AGGREGATE.   12/07/2019 Imaging   MRI pelvis IMPRESSION: 1. Masslike area in the rectum suspicious for rectal neoplasm, likely T4b based on the appearance of soft tissue extending along the anterior peritoneal reflection and into the seminal vesicles. Correlation with recent colonoscopy results may be helpful. Area of anastomosis and other areas of the pelvis are not imaged on today's exam.   12/21/2019 PET scan   IMPRESSION: 1. Unfortunately evidence for peritoneal metastasis. Intensely hypermetabolic nodules along the LEFT pericolic gutter. Favor hypermetabolic mass anterior to the rectum to represent serosal implant along the ventral surface of the rectum. 2. local recurrence within the LEFT abdominal wall at site prior colostomy. Intense hypermetabolic thickening of the rectus muscle at this site. 3. Two hypermetabolic hepatic metastasis.   01/15/2020 Relapse/Recurrence   FINAL MICROSCOPIC DIAGNOSIS:   A. SOFT TISSUE, LEFT ABDOMINAL WALL, BIOPSY:  - Adenocarcinoma.  - See comment.   COMMENT:   The morphology is consistent with metastatic colorectal adenocarcinoma.    01/28/2020 -  Chemotherapy   First line FOLFIRI q2weeks starting 01/28/20 for 8 cycles.  -----Bevacizumab added with C2.  -----Changed to maintenance Xeloda 2000 mg twice daily for 2 weeks on/1 week off and bevacizumab 06/02/20. Starting with C3, dose reduce to 1531m BID due to skin toxicity.   05/05/2020 Imaging   IMPRESSION: 1. Improved appearance, with reduced size of the hepatic metastatic lesions and reduced size of the peritoneal tumor implants. 2. Other imaging findings of potential clinical significance: Notable prostatomegaly. Multilevel impingement in the lumbar spine. Dependent density in the  gallbladder possibly from sludge or gallstones.  Degenerative glenohumeral arthropathy bilaterally. 3. Aortic atherosclerosis.   08/15/2020 Imaging   CT CAP  IMPRESSION: Chest Impression:   No evidence of thoracic metastasis   Abdomen / Pelvis Impression:   1. Hepatic metastasis are no longer measurable by CT imaging. 2. Peritoneal nodular metastasis in the LEFT abdomen are decreased in size. 3. No evidence of new peritoneal disease. 4. Thickening and LEFT rectus muscle at site of prior metastasis. No interval change. 5. No evidence of new or progressive colorectal carcinoma.     11/27/2020 Imaging   CT CAP  IMPRESSION: 1. There are multiple small pulmonary nodules in the right upper lobe that are new or enlarged compared to prior examination but measuring 4 mm or smaller, suspicious for pulmonary metastatic disease. 2. Unchanged peritoneal nodule adjacent to the tip of the spleen measuring 1.3 x 1.2 cm. Slightly peritoneal nodule adjacent to the splenic flexure measuring no greater than 6 mm, previously 8 mm. 3. No significant change in previously hypermetabolic soft tissue mass centered about the left lower quadrant colostomy site. 4. Previously established hypermetabolic hepatic metastatic disease remains inapparent by CT. 5. Status post sigmoid colon resection and reanastomosis. 6. Prostatomegaly with thickening of the decompressed urinary bladder, likely secondary to chronic outlet obstruction. 7. Cholelithiasis.   Aortic Atherosclerosis (ICD10-I70.0).      CURRENT THERAPY:  First line FOLFIRI q2weeks starting 01/28/20 for 8 cycles. Bevacizumab added with C2. Changed to maintenance Xeloda 2000 mg twice daily for 2 weeks on/1 week off and bevacizumab 06/02/20. Continue Beva every 3 weeks. C3 dose reduce to 1575m BID due to skin toxicity.   INTERVAL HISTORY:  Dave Vaughn here for a follow up of colon cancer. He was last seen by me on 12/22/20. He presents to  the clinic alone. He reports pain (rated 4/10) to his left mid chest when he stands for a long period of time or walks for a long time. He notes it is in the area of his prior mesh placement. He points above his scar line. He notes it goes away after sitting. He reports some skin peeling/cracking but no bleeding or open wounds. [place]   All other systems were reviewed with the patient and are negative.  MEDICAL HISTORY:  Past Medical History:  Diagnosis Date   Adenocarcinoma, colon ([place] dx[place] 01[place]   Anemia    taking iron supplements   Anxiety    Atrial fibrillation with RVR ([facility])    [place] 01/10[place]   Depression    Diverticulitis    Dysrhythmia    afib   History of kidney stones    Hypertension     SURGICAL HISTORY: Past Surgical History:  Procedure Laterality Date   ANKLE SURGERY Left    when he was in college   COLON RESECTION N/A 01/11/2018   Procedure: LEFT COLON RESECTION, TAKEDOWN SPLENIC FLEXURE, COLOSTOMY;  Surgeon: IFanny Skates MD;  Location: [facility];  Service: General;  Laterality: N/A;   COLONOSCOPY  01/11/2018   Procedure: COLONOSCOPY;  Surgeon: GJackquline Denmark MD;  Location: [facility];  Service: Endoscopy;;   COLONOSCOPY  05/10/2018   colonscopy  05/10/2018   HERNIA REPAIR     HERNIA REPAIR  03/02/2019   EXPLORATORY LAPAROTOMY (N/A Abdomen)   INCISIONAL HERNIA REPAIR N/A 03/02/2019   Procedure: INCISIONAL HERNIA REPAIR , RECTORECTUS VS TAR HERNIA REPAIR;  Surgeon: Ralene Ok, MD;  Location: [place];  Service: General;  Laterality: N/A;   INSERTION OF MESH N/A 03/02/2019   Procedure: Insertion Of Mesh;  Surgeon: Ralene Ok, MD;  Location: [place];  Service: General;  Laterality: N/A;   LAPAROTOMY N/A 03/02/2019   Procedure: EXPLORATORY LAPAROTOMY;  Surgeon: Ralene Ok, MD;  Location: [place];  Service: General;  Laterality:  N/A;   LYSIS OF ADHESION N/A 06/12/2018   Procedure: LYSIS OF ADHESIONS;  Surgeon: [name], MD;  Location: [facility];  Service: General;  Laterality: N/A;   LYSIS OF ADHESION N/A 03/02/2019   Procedure: Lysis Of Adhesion;  Surgeon: Ralene Ok, MD;  Location: [place];  Service: General;  Laterality: N/A;   PORTACATH PLACEMENT Right 03/07/2018   Procedure: INSERTION PORT-A-CATH RIGHT SUBCLAVIAN;  Surgeon: [name], MD;  Location: [place];  Service: General;  Laterality: Right;   PROCTOSCOPY N/A 06/12/2018   Procedure: RIGID PROCTOSCOPY;  Surgeon: [name], MD;  Location: [facility];  Service: General;  Laterality: N/A;   thumb surgery   2018   cyst removal    I have reviewed the social history and family history with the patient and they are unchanged from previous note.  ALLERGIES:  has No Known Allergies.  MEDICATIONS:  Current Outpatient Medications  Medication Sig Dispense Refill   apixaban (ELIQUIS) 5 MG TABS tablet Take 1 tablet (5 mg total) by mouth 2 (two) times daily. 60 tablet 0   b complex vitamins tablet Take 1 tablet by mouth daily.     capecitabine (XELODA) 500 MG tablet TAKE 3 TABLETS BY MOUTH  EVERY 12 HOURS WITHIN 30  MINUTES OF A MEAL FOR 14  DAYS ON THEN 7 DAYS OFF 84 tablet 1   diltiazem (CARDIZEM CD) 360 MG 24 hr capsule Take 1 capsule (360 mg total) by mouth daily. 90 capsule 3   diphenoxylate-atropine (LOMOTIL) 2.5-0.025 MG tablet Take 1-2 tablets by mouth 4 (four) times daily as needed for diarrhea or loose stools. 60 tablet 1   docusate sodium (COLACE) 100 MG capsule Take 100 mg by mouth 2 (two) times daily.     levothyroxine (SYNTHROID) 75 MCG tablet TAKE 1 TABLET(75 MCG) BY MOUTH DAILY 90 tablet 1   lidocaine-prilocaine (EMLA) cream Apply 1 application topically as needed. 30 g 1   Multiple Vitamins-Iron (MULTIVITAMIN/IRON PO) Take 1 tablet by mouth daily.      prochlorperazine (COMPAZINE) 10 MG tablet Take 1 tablet (10 mg total) by mouth every 6 (six)  hours as needed for nausea or vomiting. 30 tablet 0   tamsulosin (FLOMAX) 0.4 MG CAPS capsule Take 0.4 mg by mouth 2 (two) times daily.      urea (CARMOL) 10 % cream Apply topically as needed. 142 g 2   No current facility-administered medications for this visit.   Facility-Administered Medications Ordered in Other Visits  Medication Dose Route Frequency Provider Last Rate Last Admin   0.9 %  sodium chloride infusion   Intravenous Once [name], MD       sodium chloride flush (NS) 0.9 % injection 10 mL  10 mL Intracatheter PRN [name], MD   10 mL at 01/12/21 1250    PHYSICAL EXAMINATION: ECOG PERFORMANCE STATUS: 1 - Symptomatic but completely ambulatory  Vitals:   01/12/21 1033  BP: 133/80  Pulse: 69  Resp: 17  Temp: 97.9 F (36.6 C)  SpO2: 99%   Wt Readings from Last 3 Encounters:  01/12/21 215 lb 11.2 oz (  97.8 kg)  12/22/20 216 lb 12.8 oz (98.3 kg)  12/01/20 219 lb 14.4 oz (99.7 kg)     GENERAL:alert, no distress and comfortable SKIN: skin color, texture, turgor are normal, no rashes or significant lesions EYES: normal, Conjunctiva are pink and non-injected, sclera clear  NECK: supple, thyroid normal size, non-tender, without nodularity LUNGS: clear to auscultation and percussion with normal breathing effort HEART: regular rate & rhythm and no murmurs and no lower extremity edema ABDOMEN:abdomen soft, non-tender and normal bowel sounds; (+) area of scar tissue to site of reported pain. Musculoskeletal:no cyanosis of digits and no clubbing  NEURO: alert & oriented x 3 with fluent speech, no focal motor/sensory deficits  LABORATORY DATA:  I have reviewed the data as listed CBC Latest Ref Rng & Units 01/12/2021 12/22/2020 12/01/2020  WBC 4.0 - 10.5 K/uL 5.1 6.3 6.6  Hemoglobin 13.0 - 17.0 g/dL 14.1 14.3 14.9  Hematocrit 39.0 - 52.0 % 41.1 41.6 42.9  Platelets 150 - 400 K/uL 174 214 190     CMP Latest Ref Rng & Units 01/12/2021 12/22/2020 12/01/2020  Glucose 70 - 99 mg/dL  124(H) 124(H) 99  BUN 8 - 23 mg/dL _0 Creatinine 0.61 - 1.24 mg/dL 1.30(H) 1.23 1.21  Sodium 135 - 145 mmol/L 141 144 140  Potassium 3.5 - 5.1 mmol/L 4.1 3.9 4.0  Chloride 98 - 111 mmol/L 106 107 106  CO2 22 - 32 mmol/L _1 Calcium 8.9 - 10.3 mg/dL 9.2 9.2 9.2  Total Protein 6.5 - 8.1 g/dL 6.7 6.9 7.0  Total Bilirubin 0.3 - 1.2 mg/dL 1.2 1.0 0.9  Alkaline Phos 38 - 126 U/L 94 94 95  AST 15 - 41 U/L _2 ALT 0 - 44 U/L _3 RADIOGRAPHIC STUDIES: I have personally reviewed the radiological images as listed and agreed with the findings in the report. No results found.   ASSESSMENT & PLAN:  Dave Vaughn is a 75 y.o. male with   1. Cancer of left colon, adenocarcinoma, stage IIIB (pT3N1cM0), MSI-stable, liver and peritoneal recurrence 12/2019 -Diagnosed in 01/2018. Treated with surgery and adjuvant FOLFOX. Due to side effects Oxaliplatin was stopped after 3 cycles and chemo was stopped after 6 cycles. He declined completing 6 months of chemo treatment.  -Unfortunately he had local recurrence in left abdominal wall with peritoneal metastasis, Two hypermetabolic hepatic metastasis in 12/2019. His US Biopsy of abdominal wall from 01/2020 confirmed metastatic colorectal adenocarcinoma.  -His 01/15/20 FO results show Kras mutation, MSS, he is not a candidate for EGFR inhibitor or immunotherapy  -I started him on first-line FOLFIRI q2weeks on 01/28/20. Bevacizumab (MVASI) was added with C2. He completed 8 cycles before switching to maintenance Xeloda 209m BID 2 weeks on/1 week off and Bevacizumab on 06/02/20. C3 dose reduce to 1506mBID due to skin toxicity.  -Due to his liver mets, HIPEC may not be an option.  -CT CAP 11/27/20 showed overall stable disease. -Labs reviewed, CBC unremarkable, CBC pending. Urine protein 100 today. Overall adequate for Beva today. He has started this cycle Xeloda this morning, he is tolerating well overall. I called in Urea cream for him  today  -Plan for restaging CT in 5-6 weeks   2. Lung Nodules -CT CAP 11/27/20 showed multiple small pulmonary nodules up to 4 mm in RUL that are new or enlarged. -I recommend observation for now. Next CT planned for 02/2021.   2. Comorbidities:  Atrial Fibrillation, CKD stage III, BPH, Elevated TSH -continue Eliquis. Continue follow-up with cardiology Dr Lyla Son -Also continue to Followed with urologist Dr. Lovena Neighbours. Pelvic MRI on 7/1 shows marked prostatomegaly with signs of BPH extending into the bladder base -TSH on 02/15/20 at 91.79. He is on Synthroid since 02/16/20. Will continue to monitor. Stable.    3. Goal of care discussion -The patient understands the goal of care is palliative. -He is full code now    4. Skin toxicity, secondary to Xeloda   -After C2 Xeloda he developed moderate skin rash of his forearms and sensitivity of his feet. -More mild with Xeloda dose reduction. -He is experiencing skin peeling. I recommended Urea cream, which I had previously prescribed for him. He is unsure if he has this, so I will send in a refill.      Plan  -proceed with bevacizumab -Lab, flush, F/u and Bevacizumab in 3 and 6 weeks. -Plan for restaging CT in 5-6 weeks    No problem-specific Assessment & Plan notes found for this encounter.   Orders Placed This Encounter  Procedures   CT CHEST ABDOMEN PELVIS W CONTRAST    Standing Status:   Future    Standing Expiration Date:   01/12/2022    Order Specific Question:   If indicated for the ordered procedure, I authorize the administration of contrast media per Radiology protocol    Answer:   Yes    Order Specific Question:   Preferred imaging location?    Answer:   [facility]    Order Specific Question:   Is Oral Contrast requested for this exam?    Answer:   No oral contrast    Order Specific Question:   Reason for No Oral Contrast    Answer:   Other    Order Specific Question:   Please answer why no oral contrast is requested     Answer:   intolerance to oral contrast    Order Specific Question:   Reason for Exam (SYMPTOM  OR DIAGNOSIS REQUIRED)    Answer:   evaluate chemo response   All questions were answered. The patient knows to call the clinic with any problems, questions or concerns. No barriers to learning was detected. The total time spent in the appointment was 30 minutes.     [name], MD 01/12/2021   I, Wilburn Mylar, am acting as scribe for [name], MD.   I have reviewed the above documentation for accuracy and completeness, and I agree with the above.

## 2021-01-12 NOTE — Patient Instructions (Signed)
[facility]  Discharge Instructions: Thank you for choosing [facility] to provide your oncology and hematology care.   If you have a lab appointment with the [facility], please go directly to the [facility] and check in at the registration area.   Wear comfortable clothing and clothing appropriate for easy access to any Portacath or PICC line.   We strive to give you quality time with your provider. You may need to reschedule your appointment if you arrive late (15 or more minutes).  Arriving late affects you and other patients whose appointments are after yours.  Also, if you miss three or more appointments without notifying the office, you may be dismissed from the clinic at the provider's discretion.      For prescription refill requests, have your pharmacy contact our office and allow 72 hours for refills to be completed.    Today you received the following chemotherapy and/or immunotherapy agents - Bevacizumab      To help prevent nausea and vomiting after your treatment, we encourage you to take your nausea medication as directed.  BELOW ARE SYMPTOMS THAT SHOULD BE REPORTED IMMEDIATELY: *FEVER GREATER THAN 100.4 F (38 C) OR HIGHER *CHILLS OR SWEATING *NAUSEA AND VOMITING THAT IS NOT CONTROLLED WITH YOUR NAUSEA MEDICATION *UNUSUAL SHORTNESS OF BREATH *UNUSUAL BRUISING OR BLEEDING *URINARY PROBLEMS (pain or burning when urinating, or frequent urination) *BOWEL PROBLEMS (unusual diarrhea, constipation, pain near the anus) TENDERNESS IN MOUTH AND THROAT WITH OR WITHOUT PRESENCE OF ULCERS (sore throat, sores in mouth, or a toothache) UNUSUAL RASH, SWELLING OR PAIN  UNUSUAL VAGINAL DISCHARGE OR ITCHING   Items with * indicate a potential emergency and should be followed up as soon as possible or go to the Emergency Department if any problems should occur.  Please show the CHEMOTHERAPY ALERT CARD or IMMUNOTHERAPY ALERT CARD at check-in  to the Emergency Department and triage nurse.  Should you have questions after your visit or need to cancel or reschedule your appointment, please contact [facility]  Dept: [phone]  and follow the prompts.  Office hours are 8:00 a.m. to 4:30 p.m. Monday - Friday. Please note that voicemails left after 4:00 p.m. may not be returned until the following business day.  We are closed weekends and major holidays. You have access to a nurse at all times for urgent questions. Please call the main number to the clinic Dept: [phone] and follow the prompts.   For any non-urgent questions, you may also contact your provider using MyChart. We now offer e-Visits for anyone 42 and older to request care online for non-urgent symptoms. For details visit [email or web address].   Also download the MyChart app! Go to the app store, search "MyChart", open the app, select [facility], and log in with your MyChart username and password.  Due to Covid, a mask is required upon entering the hospital/clinic. If you do not have a mask, one will be given to you upon arrival. For doctor visits, patients may have 1 support person aged 7 or older with them. For treatment visits, patients cannot have anyone with them due to current Covid guidelines and our immunocompromised population.   Bevacizumab injection What is this medication? BEVACIZUMAB (be va SIZ yoo mab) is a monoclonal antibody. It is used to treatmany types of cancer. This medicine may be used for other purposes; ask your health care provider orpharmacist if you have questions. COMMON BRAND NAME(S): Avastin, MVASI, Noah Charon  What should I tell my care team before I take this medication? They need to know if you have any of these conditions: diabetes heart disease high blood pressure history of coughing up blood prior anthracycline chemotherapy (e.g., doxorubicin, daunorubicin, epirubicin) recent or ongoing radiation  therapy recent or planning to have surgery stroke an unusual or allergic reaction to bevacizumab, hamster proteins, mouse proteins, other medicines, foods, dyes, or preservatives pregnant or trying to get pregnant breast-feeding How should I use this medication? This medicine is for infusion into a vein. It is given by a health careprofessional in a hospital or clinic setting. Talk to your pediatrician regarding the use of this medicine in children.Special care may be needed. Overdosage: If you think you have taken too much of this medicine contact apoison control center or emergency room at once. NOTE: This medicine is only for you. Do not share this medicine with others. What if I miss a dose? It is important not to miss your dose. Call your doctor or health careprofessional if you are unable to keep an appointment. What may interact with this medication? Interactions are not expected. This list may not describe all possible interactions. Give your health care provider a list of all the medicines, herbs, non-prescription drugs, or dietary supplements you use. Also tell them if you smoke, drink alcohol, or use illegaldrugs. Some items may interact with your medicine. What should I watch for while using this medication? Your condition will be monitored carefully while you are receiving this medicine. You will need important blood work and urine testing done while youare taking this medicine. This medicine may increase your risk to bruise or bleed. Call your doctor orhealth care professional if you notice any unusual bleeding. Before having surgery, talk to your health care provider to make sure it is ok. This drug can increase the risk of poor healing of your surgical site or wound. You will need to stop this drug for 28 days before surgery. After surgery, wait at least 28 days before restarting this drug. Make sure the surgical site or wound is healed enough before restarting this drug. Talk to  your health careprovider if questions. Do not become pregnant while taking this medicine or for 6 months after stopping it. Women should inform their doctor if they wish to become pregnant or think they might be pregnant. There is a potential for serious side effects to an unborn child. Talk to your health care professional or pharmacist for more information. Do not breast-feed an infant while taking this medicine andfor 6 months after the last dose. This medicine has caused ovarian failure in some women. This medicine may interfere with the ability to have a child. You should talk to your doctor orhealth care professional if you are concerned about your fertility. What side effects may I notice from receiving this medication? Side effects that you should report to your doctor or health care professionalas soon as possible: allergic reactions like skin rash, itching or hives, swelling of the face, lips, or tongue chest pain or chest tightness chills coughing up blood high fever seizures severe constipation signs and symptoms of bleeding such as bloody or black, tarry stools; red or dark-brown urine; spitting up blood or brown material that looks like coffee grounds; red spots on the skin; unusual bruising or bleeding from the eye, gums, or nose signs and symptoms of a blood clot such as breathing problems; chest pain; severe, sudden headache; pain, swelling, warmth in the leg signs and  symptoms of a stroke like changes in vision; confusion; trouble speaking or understanding; severe headaches; sudden numbness or weakness of the face, arm or leg; trouble walking; dizziness; loss of balance or coordination stomach pain sweating swelling of legs or ankles vomiting weight gain Side effects that usually do not require medical attention (report to yourdoctor or health care professional if they continue or are bothersome): back pain changes in taste decreased appetite dry  skin nausea tiredness This list may not describe all possible side effects. Call your doctor for medical advice about side effects. You may report side effects to FDA at1-800-FDA-1088. Where should I keep my medication? This drug is given in a hospital or clinic and will not be stored at home. NOTE: This sheet is a summary. It may not cover all possible information. If you have questions about this medicine, talk to your doctor, pharmacist, orhealth care provider.  2022 [employer] Standard (2019-03-21 10:50:46)

## 2021-01-14 ENCOUNTER — Telehealth: Payer: Self-pay | Admitting: Hematology

## 2021-01-14 NOTE — Telephone Encounter (Signed)
Left message with follow-up appointment per 8/8 los. 

## 2021-01-19 ENCOUNTER — Telehealth: Payer: Self-pay

## 2021-01-19 NOTE — Telephone Encounter (Signed)
This nurse called patient related to scheduling CT scan.  Left a message with the scheduled date and time and instructions to check his MyChart message for full instructions for CT scan.  Patient knows to call clinic with any further questions or concerns oar problems.

## 2021-01-27 ENCOUNTER — Other Ambulatory Visit: Payer: Self-pay | Admitting: Hematology

## 2021-01-27 DIAGNOSIS — C186 Malignant neoplasm of descending colon: Secondary | ICD-10-CM

## 2021-01-29 ENCOUNTER — Encounter: Payer: Self-pay | Admitting: Hematology

## 2021-01-29 ENCOUNTER — Encounter: Payer: Self-pay | Admitting: Oncology

## 2021-01-29 ENCOUNTER — Encounter (HOSPITAL_COMMUNITY): Payer: Self-pay

## 2021-01-29 ENCOUNTER — Other Ambulatory Visit: Payer: Self-pay

## 2021-01-29 ENCOUNTER — Ambulatory Visit (HOSPITAL_COMMUNITY)
Admission: RE | Admit: 2021-01-29 | Discharge: 2021-01-29 | Disposition: A | Discharge status: Home / Self Care | Payer: Commercial Managed Care - PPO | Source: Ambulatory Visit | Attending: Hematology | Admitting: Hematology

## 2021-01-29 DIAGNOSIS — K6389 Other specified diseases of intestine: Secondary | ICD-10-CM | POA: Diagnosis not present

## 2021-01-29 DIAGNOSIS — C186 Malignant neoplasm of descending colon: Secondary | ICD-10-CM | POA: Diagnosis not present

## 2021-01-29 DIAGNOSIS — K8020 Calculus of gallbladder without cholecystitis without obstruction: Secondary | ICD-10-CM | POA: Diagnosis not present

## 2021-01-29 DIAGNOSIS — R918 Other nonspecific abnormal finding of lung field: Secondary | ICD-10-CM | POA: Diagnosis not present

## 2021-01-29 DIAGNOSIS — I700 Atherosclerosis of aorta: Secondary | ICD-10-CM | POA: Diagnosis not present

## 2021-01-29 DIAGNOSIS — N400 Enlarged prostate without lower urinary tract symptoms: Secondary | ICD-10-CM | POA: Diagnosis not present

## 2021-01-29 DIAGNOSIS — C19 Malignant neoplasm of rectosigmoid junction: Secondary | ICD-10-CM | POA: Diagnosis not present

## 2021-01-29 DIAGNOSIS — I2510 Atherosclerotic heart disease of native coronary artery without angina pectoris: Secondary | ICD-10-CM | POA: Diagnosis not present

## 2021-01-29 MED ORDER — IOHEXOL 350 MG/ML SOLN
80.0000 mL | Freq: Once | INTRAVENOUS | Status: AC | PRN
  Administered 2021-01-29: 80 mL via INTRAVENOUS

## 2021-02-02 ENCOUNTER — Other Ambulatory Visit: Payer: Self-pay

## 2021-02-02 ENCOUNTER — Inpatient Hospital Stay: Payer: Commercial Managed Care - PPO

## 2021-02-02 ENCOUNTER — Inpatient Hospital Stay (HOSPITAL_BASED_OUTPATIENT_CLINIC_OR_DEPARTMENT_OTHER): Payer: Commercial Managed Care - PPO | Admitting: Hematology

## 2021-02-02 VITALS — BP 151/85 | HR 88 | Temp 98.1°F | Resp 18 | Ht 71.0 in | Wt 218.1 lb

## 2021-02-02 DIAGNOSIS — C186 Malignant neoplasm of descending colon: Secondary | ICD-10-CM

## 2021-02-02 DIAGNOSIS — N183 Chronic kidney disease, stage 3 unspecified: Secondary | ICD-10-CM | POA: Diagnosis not present

## 2021-02-02 DIAGNOSIS — C786 Secondary malignant neoplasm of retroperitoneum and peritoneum: Secondary | ICD-10-CM | POA: Diagnosis not present

## 2021-02-02 DIAGNOSIS — R946 Abnormal results of thyroid function studies: Secondary | ICD-10-CM | POA: Diagnosis not present

## 2021-02-02 DIAGNOSIS — Z5112 Encounter for antineoplastic immunotherapy: Secondary | ICD-10-CM | POA: Diagnosis not present

## 2021-02-02 DIAGNOSIS — I4891 Unspecified atrial fibrillation: Secondary | ICD-10-CM | POA: Diagnosis not present

## 2021-02-02 DIAGNOSIS — C787 Secondary malignant neoplasm of liver and intrahepatic bile duct: Secondary | ICD-10-CM | POA: Diagnosis not present

## 2021-02-02 DIAGNOSIS — I129 Hypertensive chronic kidney disease with stage 1 through stage 4 chronic kidney disease, or unspecified chronic kidney disease: Secondary | ICD-10-CM | POA: Diagnosis not present

## 2021-02-02 DIAGNOSIS — Z452 Encounter for adjustment and management of vascular access device: Secondary | ICD-10-CM | POA: Diagnosis not present

## 2021-02-02 DIAGNOSIS — N400 Enlarged prostate without lower urinary tract symptoms: Secondary | ICD-10-CM | POA: Diagnosis not present

## 2021-02-02 DIAGNOSIS — Z95828 Presence of other vascular implants and grafts: Secondary | ICD-10-CM

## 2021-02-02 LAB — CBC WITH DIFFERENTIAL (CANCER CENTER ONLY)
Abs Immature Granulocytes: 0.01 10*3/uL (ref 0.00–0.07)
Basophils Absolute: 0 10*3/uL (ref 0.0–0.1)
Basophils Relative: 1 %
Eosinophils Absolute: 0.1 10*3/uL (ref 0.0–0.5)
Eosinophils Relative: 3 %
HCT: 40.5 % (ref 39.0–52.0)
Hemoglobin: 14.3 g/dL (ref 13.0–17.0)
Immature Granulocytes: 0 %
Lymphocytes Relative: 32 %
Lymphs Abs: 1.7 10*3/uL (ref 0.7–4.0)
MCH: 34.8 pg — ABNORMAL HIGH (ref 26.0–34.0)
MCHC: 35.3 g/dL (ref 30.0–36.0)
MCV: 98.5 fL (ref 80.0–100.0)
Monocytes Absolute: 0.4 10*3/uL (ref 0.1–1.0)
Monocytes Relative: 8 %
Neutro Abs: 2.9 10*3/uL (ref 1.7–7.7)
Neutrophils Relative %: 56 %
Platelet Count: 191 10*3/uL (ref 150–400)
RBC: 4.11 MIL/uL — ABNORMAL LOW (ref 4.22–5.81)
RDW: 15.8 % — ABNORMAL HIGH (ref 11.5–15.5)
WBC Count: 5.2 10*3/uL (ref 4.0–10.5)
nRBC: 0 % (ref 0.0–0.2)

## 2021-02-02 LAB — CMP (CANCER CENTER ONLY)
ALT: 15 U/L (ref 0–44)
AST: 23 U/L (ref 15–41)
Albumin: 4 g/dL (ref 3.5–5.0)
Alkaline Phosphatase: 105 U/L (ref 38–126)
Anion gap: 12 (ref 5–15)
BUN: 18 mg/dL (ref 8–23)
CO2: 24 mmol/L (ref 22–32)
Calcium: 9.1 mg/dL (ref 8.9–10.3)
Chloride: 106 mmol/L (ref 98–111)
Creatinine: 1.25 mg/dL — ABNORMAL HIGH (ref 0.61–1.24)
GFR, Estimated: >60 mL/min (ref >60)
Glucose, Bld: 114 mg/dL — ABNORMAL HIGH (ref 70–99)
Potassium: 4 mmol/L (ref 3.5–5.1)
Sodium: 142 mmol/L (ref 135–145)
Total Bilirubin: 1 mg/dL (ref 0.3–1.2)
Total Protein: 7.1 g/dL (ref 6.5–8.1)

## 2021-02-02 LAB — TOTAL PROTEIN, URINE DIPSTICK: Protein, ur: 300 mg/dL — AB

## 2021-02-02 MED ORDER — SODIUM CHLORIDE 0.9% FLUSH
10.0000 mL | INTRAVENOUS | Status: DC | PRN
  Administered 2021-02-02: 10 mL

## 2021-02-02 MED ORDER — LIDOCAINE 5 % EX PTCH: 1.0000 | MEDICATED_PATCH | CUTANEOUS | 0 refills | Status: DC

## 2021-02-02 NOTE — Progress Notes (Signed)
[place]   [phone] [phone]   Clinic Follow up Note   Patient Care Team: [name], MD as PCP - General (Family Medicine) [name], MD as Consulting Physician (Cardiology) [name], MD as Consulting Physician (Gastroenterology) [name], MD as Consulting Physician (General Surgery) [name], MD as Consulting Physician (General Surgery) [name], MD as Consulting Physician (Urology) [name], MD as Consulting Physician (Medical Oncology)  Date of Service:  02/02/2021  CHIEF COMPLAINT: f/u of colon cancer  CURRENT THERAPY:  First line FOLFIRI q2weeks starting 01/28/20 for 8 cycles. Bevacizumab added with C2. Changed to maintenance Xeloda 2000 mg twice daily for 2 weeks on/1 week off and bevacizumab 06/02/20. Continue Beva every 3 weeks. C3 dose reduce to 1570m BID due to skin toxicity.   ASSESSMENT & PLAN:  SANGELGABRIEL WILLMOREis a 75y.o. male with   1. Cancer of left colon, adenocarcinoma, stage IIIB (pT3N1cM0), MSI-stable, liver and peritoneal recurrence 12/2019 -Diagnosed in 01/2018. Treated with surgery and adjuvant FOLFOX. Due to side effects Oxaliplatin was stopped after 3 cycles and chemo was stopped after 6 cycles. He declined completing 6 months of chemo treatment.  -Unfortunately he had local recurrence in left abdominal wall with peritoneal metastasis, Two hypermetabolic hepatic metastasis in 12/2019. His [place] of [place] from 01/2020 confirmed metastatic colorectal adenocarcinoma.  -His 01/15/20 FO results show Kras mutation, MSS, he is not a candidate for EGFR inhibitor or immunotherapy  -I started him on first-line FOLFIRI q2weeks on 01/28/20. Bevacizumab (MVASI) was added with C2. He completed 8 cycles before switching to maintenance Xeloda 20062mBID 2 weeks on/1 week off and Bevacizumab on 06/02/20. C3 dose reduce to 15001mID due to skin toxicity.  -Due to his liver mets,  HIPEC may not be an option.  -CT CAP 01/29/21 showed overall stable disease. I personally reviewed his scan images and discussed with pt today  -Labs reviewed, CBC unremarkable, CBC pending. Urine protein 300 today. Will hold Beva today. He has started this cycle Xeloda this morning, he is tolerating well overall.  -labs and f/u with beva in 3 and 6 weeks   2. Lung Nodules -CT CAP 11/27/20 showed multiple small pulmonary nodules up to 4 mm in RUL that are new or enlarged. -repeat CT CAP 01/29/21 showed only minimal interval progression   2. Comorbidities: Atrial Fibrillation, CKD stage III, BPH, Elevated TSH -continue Eliquis. Continue follow-up with cardiology Dr HawLyla Sonlso continue to Followed with urologist Dr. WinLovena Neighbourselvic MRI on 7/1 shows marked prostatomegaly with signs of BPH extending into the bladder base -TSH on 02/15/20 at 91.79. He is on Synthroid since 02/16/20. Will continue to monitor. Stable.    3. Goal of care discussion -The patient understands the goal of care is palliative. -He is full code now    4. Skin toxicity, secondary to Xeloda   -After C2 Xeloda he developed moderate skin rash of his forearms and sensitivity of his feet. -More mild with Xeloda dose reduction.     Plan  -cancel bevacizumab today d/t high urine protein -I called in lidocaine patch for pain -Lab, flush, F/u and Bevacizumab in 3 and 6 weeks.   No problem-specific Assessment & Plan notes found for this encounter.   SUMMARY OF ONCOLOGIC HISTORY: Oncology History Overview Note  Cancer Staging Cancer of left colon ([facility] form: Colon and Rectum, AJCC 8th Edition - Pathologic stage from 01/11/2018: Stage IIIB (pT3, pN1c, cM0) -  Signed by [name], MD on 01/16/2018     Cancer of left colon [facility])  01/10/2018 Imaging   CT AP W Contrast 01/10/18  IMPRESSION: Irregular soft tissue density causing stricture of the mid descending colon likely the site of obstruction for the dilated small  bowel. 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 No evidence of perforation.   Equivocal findings involving the appendix measuring 1.2 cm at the appendiceal tip with mucosal enhancement. No adjacent free fluid or inflammatory change. Findings are nonspecific, but can be seen with early acute appendicitis.   Mild prostatic enlargement. Increased density over the posterior bladder base likely due to the large prostatic impression although cannot completely exclude a bladder mass. Urology protocol CT or ultrasound may be helpful for better evaluation.   Mild cholelithiasis.   Stable 1.5 cm cystic structure over the lower pole right kidney likely slightly hyperdense cyst.   Diverticulosis of the colon.   Aortic Atherosclerosis (ICD10-I70.0).     01/11/2018 Cancer Staging   Staging form: Colon and Rectum, AJCC 8th Edition - Pathologic stage from 01/11/2018: Stage IIIB (pT3, pN1c, cM0) - Signed by [name], MD on 01/16/2018   01/11/2018 Surgery   LEFT COLON RESECTION, TAKEDOWN SPLENIC FLEXURE, COLOSTOMY by Dr. [name]    01/11/2018 Procedure   Colonoscopy 01/11/18 by Dr. [name]  - Malignant completely obstructing tumor in the mid descending colon. Tattooed. - Diverticulosis in the sigmoid colon. - Non-bleeding internal hemorrhoids. - No specimens collected.   01/11/2018 Pathology Results   Diagnosis 01/11/18  1. Colon, segmental resection for tumor, descending colon - INVASIVE COLORECTAL ADENOCARCINOMA, 4 CM. - TUMOR EXTENDS INTO PERICOLONIC CONNECTIVE TISSUE. - TUMOR FOCALLY INVOLVES RADIAL MARGIN. - ONE MESENTERIC TUMOR DEPOSIT. - THIRTEEN BENIGN LYMPH NODES (0/13). 2. Colon, segmental resection, splenic flexure - BENIGN COLON. - NO EVIDENCE OF MALIGNANCY .   01/11/2018 Tumor Marker   Baseline CEA at 3.4   01/16/2018 Initial Diagnosis   Cancer of left colon ([place])   01/23/2018 Imaging   CT CHEST WO CONTRAST IMPRESSION: 1. No evidence for metastatic disease within the chest. 2. Small left  pleural effusion with underlying opacities which may represent atelectasis. Right basilar atelectasis. 3. Few foci of gas within the upper abdomen in the omentum with surrounding fat stranding, likely postsurgical 4. Aortic Atherosclerosis (ICD10-I70.0).   03/08/2018 - 05/15/2018 Chemotherapy   adjuvant FOLOFX. Due to side effects of neuropahty Oxaliplatin was stopped after 3 cycles and chemo was stopped after 6 cycles. He declined completing 6 months of chemo treatment.     03/19/2018 Imaging   03/19/2018 CT AP IMPRESSION: 1. Interval partial left hemicolectomy and descending colostomy. 2. Heterogeneous soft tissue density along the left anterior renal fascia is most likely postoperative (favor fat necrosis). No well-defined fluid collection. 3. Mild left lower quadrant edema, new since 01/10/2018. This could be postoperative. Superimposed sigmoid diverticulitis and/or cystitis cannot be excluded. 4. Subtle hyperenhancing nodule within the anterior bladder dome cannot be excluded. Consider nonemergent cystoscopy. When this is performed, recommend attention to the left ureterovesicular junction and distal left ureter to evaluate questionable soft tissue fullness. 5. Cholelithiasis. 6.  Aortic Atherosclerosis (ICD10-I70.0). 7. Prostatomegaly.   11/13/2018 Imaging   CT CAP WO Contrast 11/13/18  IMPRESSION: 1. Reversal of left lower quadrant colostomy with sigmoid colon anastomosis. No complicating features. No findings for residual or recurrent tumor or metastatic disease involving the chest, abdomen or pelvis without contrast. 2. No acute abdominal/pelvic findings. 3. Gallbladder sludge and gallstones but no findings for  acute cholecystitis. 4. Stable anterior abdominal wall hernia. 5. The right testicle is in the right inguinal canal.   10/03/2019 Imaging   CT CAP WO contrast  IMPRESSION: 1. New rounded density interposed between the prostate gland and anterior upper rectal  wall could represent adenopathy or local extension of anterior rectal tumor. 2. Marked prostatomegaly, prostate volume 150 cubic cm. 3. Other imaging findings of potential clinical significance: Aortic Atherosclerosis (ICD10-I70.0). Coronary atherosclerosis. Trace right pleural effusion. Suspected cholelithiasis. Nonobstructive left nephrolithiasis. Multilevel lumbar impingement. Bilateral mildly retracted testicles. Hypodense exophytic lesion of the right kidney, most likely to be a cyst.   11/02/2019 Procedure   colonoscopy on 11/02/2019 by Dr. [name] Plan showed normal digital rectal exam, 3 polyps in the rectum, descending colon and cecum, and a prior sigmoid: Anastomosis characterized by erythema.  He found an extrinsic nonobstructing medium-sized mass in the proximal rectum about 4 cm in length, no internal rectal mass.   Diagnosis Surgical [P], colon, cecum, descending, rectal, polyp (3) - TUBULAR ADENOMA (TWO) - NO HIGH GRADE DYSPLASIA OR CARCINOMA. - COLONIC FRAGMENT WITH BENIGN LYMPHOID AGGREGATE.   12/07/2019 Imaging   MRI pelvis IMPRESSION: 1. Masslike area in the rectum suspicious for rectal neoplasm, likely T4b based on the appearance of soft tissue extending along the anterior peritoneal reflection and into the seminal vesicles. Correlation with recent colonoscopy results may be helpful. Area of anastomosis and other areas of the pelvis are not imaged on today's exam.   12/21/2019 PET scan   IMPRESSION: 1. Unfortunately evidence for peritoneal metastasis. Intensely hypermetabolic nodules along the LEFT pericolic gutter. Favor hypermetabolic mass anterior to the rectum to represent serosal implant along the ventral surface of the rectum. 2. local recurrence within the LEFT abdominal wall at site prior colostomy. Intense hypermetabolic thickening of the rectus muscle at this site. 3. Two hypermetabolic hepatic metastasis.   01/15/2020 Relapse/Recurrence   FINAL MICROSCOPIC  DIAGNOSIS:   A. SOFT TISSUE, LEFT ABDOMINAL WALL, BIOPSY:  - Adenocarcinoma.  - See comment.   COMMENT:   The morphology is consistent with metastatic colorectal adenocarcinoma.    01/28/2020 -  Chemotherapy   First line FOLFIRI q2weeks starting 01/28/20 for 8 cycles.  -----Bevacizumab added with C2.  -----Changed to maintenance Xeloda 2000 mg twice daily for 2 weeks on/1 week off and bevacizumab 06/02/20. Starting with C3, dose reduce to 1518m BID due to skin toxicity.   05/05/2020 Imaging   IMPRESSION: 1. Improved appearance, with reduced size of the hepatic metastatic lesions and reduced size of the peritoneal tumor implants. 2. Other imaging findings of potential clinical significance: Notable prostatomegaly. Multilevel impingement in the lumbar spine. Dependent density in the gallbladder possibly from sludge or gallstones. Degenerative glenohumeral arthropathy bilaterally. 3. Aortic atherosclerosis.   08/15/2020 Imaging   CT CAP  IMPRESSION: Chest Impression:   No evidence of thoracic metastasis   Abdomen / Pelvis Impression:   1. Hepatic metastasis are no longer measurable by CT imaging. 2. Peritoneal nodular metastasis in the LEFT abdomen are decreased in size. 3. No evidence of new peritoneal disease. 4. Thickening and LEFT rectus muscle at site of prior metastasis. No interval change. 5. No evidence of new or progressive colorectal carcinoma.     11/27/2020 Imaging   CT CAP  IMPRESSION: 1. There are multiple small pulmonary nodules in the right upper lobe that are new or enlarged compared to prior examination but measuring 4 mm or smaller, suspicious for pulmonary metastatic disease. 2. Unchanged peritoneal nodule adjacent to the tip  of the spleen measuring 1.3 x 1.2 cm. Slightly peritoneal nodule adjacent to the splenic flexure measuring no greater than 6 mm, previously 8 mm. 3. No significant change in previously hypermetabolic soft tissue mass centered  about the left lower quadrant colostomy site. 4. Previously established hypermetabolic hepatic metastatic disease remains inapparent by CT. 5. Status post sigmoid colon resection and reanastomosis. 6. Prostatomegaly with thickening of the decompressed urinary bladder, likely secondary to chronic outlet obstruction. 7. Cholelithiasis.   Aortic Atherosclerosis (ICD10-I70.0).      INTERVAL HISTORY:  Dave Vaughn is here for a follow up of colon cancer. He was last seen by me on 01/12/21. He presents to the clinic alone. He reports feeling well overall with no change recently. He reports some skin darkening and cracking to his hands and feet. He notes this is stable and does not inhibit his function. He notes continued soreness to his side and is scheduled to see his surgeon soon (Dr. Kathlyn [place]. Rosendo Gros on 02/18/21).   All other systems were reviewed with the patient and are negative.  MEDICAL HISTORY:  Past Medical History:  Diagnosis Date   Adenocarcinoma, colon ([place]) dx'd 01/2018   Anemia    taking iron supplements   Anxiety    Atrial fibrillation with RVR ([facility])    Colonic obstruction ([place]) 01/10/2018   Depression    Diverticulitis    Dysrhythmia    afib   History of kidney stones    Hypertension     SURGICAL HISTORY: Past Surgical History:  Procedure Laterality Date   ANKLE SURGERY Left    when he was in college   COLON RESECTION N/A 01/11/2018   Procedure: LEFT COLON RESECTION, TAKEDOWN SPLENIC FLEXURE, COLOSTOMY;  Surgeon: [name], MD;  Location: [facility];  Service: General;  Laterality: N/A;   COLONOSCOPY  01/11/2018   Procedure: COLONOSCOPY;  Surgeon: Jackquline Denmark, MD;  Location: [facility];  Service: Endoscopy;;   COLONOSCOPY  05/10/2018   colonscopy  05/10/2018   HERNIA REPAIR     HERNIA REPAIR  03/02/2019   EXPLORATORY LAPAROTOMY (N/A Abdomen)   INCISIONAL HERNIA REPAIR N/A 03/02/2019   Procedure: INCISIONAL HERNIA REPAIR , RECTORECTUS VS TAR HERNIA REPAIR;   Surgeon: Ralene Ok, MD;  Location: [place];  Service: General;  Laterality: N/A;   INSERTION OF MESH N/A 03/02/2019   Procedure: Insertion Of Mesh;  Surgeon: Ralene Ok, MD;  Location: [place];  Service: General;  Laterality: N/A;   LAPAROTOMY N/A 03/02/2019   Procedure: EXPLORATORY LAPAROTOMY;  Surgeon: Ralene Ok, MD;  Location: [place];  Service: General;  Laterality: N/A;   LYSIS OF ADHESION N/A 06/12/2018   Procedure: LYSIS OF ADHESIONS;  Surgeon: [name], MD;  Location: [facility];  Service: General;  Laterality: N/A;   LYSIS OF ADHESION N/A 03/02/2019   Procedure: Lysis Of Adhesion;  Surgeon: Ralene Ok, MD;  Location: [place];  Service: General;  Laterality: N/A;   PORTACATH PLACEMENT Right 03/07/2018   Procedure: INSERTION PORT-A-CATH RIGHT SUBCLAVIAN;  Surgeon: [name], MD;  Location: [place];  Service: General;  Laterality: Right;   PROCTOSCOPY N/A 06/12/2018   Procedure: RIGID PROCTOSCOPY;  Surgeon: [name], MD;  Location: [facility];  Service: General;  Laterality: N/A;   thumb surgery   2018   cyst removal    I have reviewed the social history and family history with the patient and they are unchanged from previous note.  ALLERGIES:  has No Known Allergies.  MEDICATIONS:  Current  Outpatient Medications  Medication Sig Dispense Refill   apixaban (ELIQUIS) 5 MG TABS tablet Take 1 tablet (5 mg total) by mouth 2 (two) times daily. 60 tablet 0   b complex vitamins tablet Take 1 tablet by mouth daily.     capecitabine (XELODA) 500 MG tablet TAKE 3 TABLETS BY MOUTH  EVERY 12 HOURS WITHIN 30  MINUTES OF A MEAL FOR 14  DAYS ON THEN 7 DAYS OFF 84 tablet 1   diltiazem (CARDIZEM CD) 360 MG 24 hr capsule Take 1 capsule (360 mg total) by mouth daily. 90 capsule 3   diphenoxylate-atropine (LOMOTIL) 2.5-0.025 MG tablet Take 1-2 tablets by mouth 4 (four) times daily as needed for diarrhea or loose stools. 60 tablet 1   docusate sodium (COLACE) 100 MG capsule Take 100 mg  by mouth 2 (two) times daily.     levothyroxine (SYNTHROID) 75 MCG tablet TAKE 1 TABLET(75 MCG) BY MOUTH DAILY 90 tablet 1   lidocaine-prilocaine (EMLA) cream Apply 1 application topically as needed. 30 g 1   Multiple Vitamins-Iron (MULTIVITAMIN/IRON PO) Take 1 tablet by mouth daily.      prochlorperazine (COMPAZINE) 10 MG tablet Take 1 tablet (10 mg total) by mouth every 6 (six) hours as needed for nausea or vomiting. 30 tablet 0   tamsulosin (FLOMAX) 0.4 MG CAPS capsule Take 0.4 mg by mouth 2 (two) times daily.      urea (CARMOL) 10 % cream Apply topically as needed. 142 g 2   No current facility-administered medications for this visit.   Facility-Administered Medications Ordered in Other Visits  Medication Dose Route Frequency Provider Last Rate Last Admin   sodium chloride flush (NS) 0.9 % injection 10 mL  10 mL Intracatheter PRN [name], MD   10 mL at 02/02/21 1344    PHYSICAL EXAMINATION: ECOG PERFORMANCE STATUS: 1 - Symptomatic but completely ambulatory  There were no vitals filed for this visit. Wt Readings from Last 3 Encounters:  01/12/21 215 lb 11.2 oz (97.8 kg)  12/22/20 216 lb 12.8 oz (98.3 kg)  12/01/20 219 lb 14.4 oz (99.7 kg)     GENERAL:alert, no distress and comfortable SKIN: skin color normal, no rashes or significant lesions EYES: normal, Conjunctiva are pink and non-injected, sclera clear  NEURO: alert & oriented x 3 with fluent speech  LABORATORY DATA:  I have reviewed the data as listed CBC Latest Ref Rng & Units 02/02/2021 01/12/2021 12/22/2020  WBC 4.0 - 10.5 K/uL 5.2 5.1 6.3  Hemoglobin 13.0 - 17.0 g/dL 14.3 14.1 14.3  Hematocrit 39.0 - 52.0 % 40.5 41.1 41.6  Platelets 150 - 400 K/uL 191 174 214     CMP Latest Ref Rng & Units 02/02/2021 01/12/2021 12/22/2020  Glucose 70 - 99 mg/dL 114(H) 124(H) 124(H)  BUN 8 - 23 mg/dL 18 15 21   Creatinine 0.61 - 1.24 mg/dL 1.25(H) 1.30(H) 1.23  Sodium 135 - 145 mmol/L 142 141 144  Potassium 3.5 - 5.1 mmol/L 4.0 4.1  3.9  Chloride 98 - 111 mmol/L 106 106 107  CO2 22 - 32 mmol/L 24 27 27   Calcium 8.9 - 10.3 mg/dL 9.1 9.2 9.2  Total Protein 6.5 - 8.1 g/dL 7.1 6.7 6.9  Total Bilirubin 0.3 - 1.2 mg/dL 1.0 1.2 1.0  Alkaline Phos 38 - 126 U/L 105 94 94  AST 15 - 41 U/L 23 19 16   ALT 0 - 44 U/L 15 15 10       RADIOGRAPHIC STUDIES: I have personally reviewed the  radiological images as listed and agreed with the findings in the report. No results found.    No orders of the defined types were placed in this encounter.  All questions were answered. The patient knows to call the clinic with any problems, questions or concerns. No barriers to learning was detected. The total time spent in the appointment was 30 minutes.     [name], MD 02/02/2021   I, Wilburn Mylar, am acting as scribe for [name], MD.   I have reviewed the above documentation for accuracy and completeness, and I agree with the above.

## 2021-02-02 NOTE — Progress Notes (Signed)
Cardiology Office Note    Date:  02/04/2021   ID:  [name], DOB 01-Jan-1946, MRN [id number]   PCP:  [name], MD   [place]  Cardiologist:  None   Advanced Practice Provider:  No care team member to display Electrophysiologist:  None   [id number]   Chief Complaint  Patient presents with   Follow-up     History of Present Illness:  Dave Vaughn is a 75 y.o. male with history of permanent atrial fibrillation on Eliquis and diltiazem, Colon CA with mets, CKD stage III, hypertension.  Patient saw Dr. Marlou Porch 05/2020 at which time his heart rate improved from 137 bpm  after increasing diltiazem to 360 mg daily.  Patient comes in for f/u. Feels really good. HR well controlled 70's. Chemo has been changed and he's feeling much better. Labs yest stable. Crt 1.25. No bleeding problems on Eliquis. Runs 1-3 miles 4 times a week, lifts weights, rides horses, golfing. Watched HR with exercise and doesn't get over 120's. Going to [place] soon.    Past Medical History:  Diagnosis Date   Adenocarcinoma, colon ([place]) dx'd 01/2018   Anemia    taking iron supplements   Anxiety    Atrial fibrillation with RVR (HCC)    Colonic obstruction ([place]) 01/10/2018   Depression    Diverticulitis    Dysrhythmia    afib   History of kidney stones    Hypertension     Past Surgical History:  Procedure Laterality Date   ANKLE SURGERY Left    when he was in college   COLON RESECTION N/A 01/11/2018   Procedure: LEFT COLON RESECTION, TAKEDOWN SPLENIC FLEXURE, COLOSTOMY;  Surgeon: [name], MD;  Location: [facility];  Service: General;  Laterality: N/A;   COLONOSCOPY  01/11/2018   Procedure: COLONOSCOPY;  Surgeon: Jackquline Denmark, MD;  Location: [facility];  Service: Endoscopy;;   COLONOSCOPY  05/10/2018   colonscopy  05/10/2018   HERNIA REPAIR     HERNIA REPAIR  03/02/2019   EXPLORATORY LAPAROTOMY (N/A Abdomen)   INCISIONAL HERNIA REPAIR N/A 03/02/2019    Procedure: INCISIONAL HERNIA REPAIR , RECTORECTUS VS TAR HERNIA REPAIR;  Surgeon: Ralene Ok, MD;  Location: [facility];  Service: General;  Laterality: N/A;   INSERTION OF MESH N/A 03/02/2019   Procedure: Insertion Of Mesh;  Surgeon: Ralene Ok, MD;  Location[facility];  Service: General;  Laterality: N/A;   LAPAROTOMY N/A 03/02/2019   Procedure: EXPLORATORY LAPAROTOMY;  Surgeon: Ralene Ok, MD;  Location: [facility];  Service: General;  Laterality: N/A;   LYSIS OF ADHESION N/A 06/12/2018   Procedure: LYSIS OF ADHESIONS;  Surgeon: [name], MD;  Location: [facility];  Service: General;  Laterality: N/A;   LYSIS OF ADHESION N/A 03/02/2019   Procedure: Lysis Of Adhesion;  Surgeon: Ralene Ok, MD;  Location: [facility];  Service: General;  Laterality: N/A;   PORTACATH PLACEMENT Right 03/07/2018   Procedure: INSERTION PORT-A-CATH RIGHT SUBCLAVIAN;  Surgeon: [name], MD;  Location: [facility];  Service: General;  Laterality: Right;   PROCTOSCOPY N/A 06/12/2018   Procedure: RIGID PROCTOSCOPY;  Surgeon: [name], MD;  Location: [facility];  Service: General;  Laterality: N/A;   thumb surgery   2018   cyst removal    Current Medications: Current Meds  Medication Sig   apixaban (ELIQUIS) 5 MG TABS tablet Take 1 tablet (5 mg total) by mouth 2 (two) times daily.   b complex vitamins  tablet Take 1 tablet by mouth daily.   capecitabine (XELODA) 500 MG tablet TAKE 3 TABLETS BY MOUTH  EVERY 12 HOURS WITHIN 30  MINUTES OF A MEAL FOR 14  DAYS ON THEN 7 DAYS OFF   diltiazem (CARDIZEM CD) 360 MG 24 hr capsule Take 1 capsule (360 mg total) by mouth daily.   diphenoxylate-atropine (LOMOTIL) 2.5-0.025 MG tablet Take 1-2 tablets by mouth 4 (four) times daily as needed for diarrhea or loose stools.   docusate sodium (COLACE) 100 MG capsule Take 100 mg by mouth 2 (two) times daily.   levothyroxine (SYNTHROID) 75 MCG tablet TAKE 1 TABLET(75 MCG) BY MOUTH DAILY   lidocaine (LIDODERM) 5 % Place 1 patch onto the  skin daily. Remove & Discard patch within 12 hours or as directed by MD   lidocaine-prilocaine (EMLA) cream Apply 1 application topically as needed.   Multiple Vitamins-Iron (MULTIVITAMIN/IRON PO) Take 1 tablet by mouth daily.    prochlorperazine (COMPAZINE) 10 MG tablet Take 1 tablet (10 mg total) by mouth every 6 (six) hours as needed for nausea or vomiting.   tamsulosin (FLOMAX) 0.4 MG CAPS capsule Take 0.4 mg by mouth 2 (two) times daily.    urea (CARMOL) 10 % cream Apply topically as needed.     Allergies:   Patient has no known allergies.   Social History   Socioeconomic History   Marital status: Married    Spouse name: Not on file   Number of children: Not on file   Years of education: Not on file   Highest education level: Not on file  Occupational History   Not on file  Tobacco Use   Smoking status: Never   Smokeless tobacco: Former    Types: Chew    Quit date: 10/22/2019  Vaping Use   Vaping Use: Never used  Substance and Sexual Activity   Alcohol use: Yes    Alcohol/week: 2.0 standard drinks    Types: 1 Cans of beer, 1 Shots of liquor per week   Drug use: Never   Sexual activity: Not on file  Other Topics Concern   Not on file  Social History Narrative   Not on file   Social Determinants of Health   Financial Resource Strain: Not on file  Food Insecurity: Not on file  Transportation Needs: Not on file  Physical Activity: Not on file  Stress: Not on file  Social Connections: Not on file     Family History:  The patient's  family history includes Breast cancer (age of onset: 11) in his mother; Cancer in his daughter; Heart attack (age of onset: 41) in his father.   ROS:   Please see the history of present illness.    ROS All other systems reviewed and are negative.   PHYSICAL EXAM:   VS:  BP 140/84   Pulse 87   Ht '5\' 10"'$  (1.778 m)   Wt 217 lb 6.4 oz (98.6 kg)   SpO2 97%   BMI 31.19 kg/m   Physical Exam  GEN: Well nourished, well developed, in no  acute distress  Neck: no JVD, carotid bruits, or masses Cardiac:RRR; no murmurs, rubs, or gallops  Respiratory:  clear to auscultation bilaterally, normal work of breathing GI: soft, nontender, nondistended, + BS Ext: without cyanosis, clubbing, or edema, Good distal pulses bilaterally Neuro:  Alert and Oriented x 3 Psych: euthymic mood, full affect  Wt Readings from Last 3 Encounters:  02/04/21 217 lb 6.4 oz (98.6 kg)  02/02/21  218 lb 1.6 oz (98.9 kg)  01/12/21 215 lb 11.2 oz (97.8 kg)      Studies/Labs Reviewed:   EKG:  EKG is not ordered today.     Recent Labs: 02/15/2020: TSH 91.79 02/02/2021: ALT 15; BUN 18; Creatinine 1.25; Hemoglobin 14.3; Platelet Count 191; Potassium 4.0; Sodium 142   Lipid Panel No results found for: CHOL, TRIG, HDL, CHOLHDL, VLDL, LDLCALC, LDLDIRECT  Additional studies/ records that were reviewed today include:  NST 01/17/2019 IMPRESSION: 1. Small region of mild to moderate reversible ischemia in the apical segment anterior wall.   2. Normal left ventricular wall motion.   3. Left ventricular ejection fraction 47%   4. Non invasive risk stratification*: Intermediate   *2012 Appropriate Use Criteria for Coronary Revascularization Focused Update: J Am Coll Cardiol. N6492421. http://content.airportbarriers.com.aspx?articleid=1201161     Electronically Signed   By: Suzy Bouchard M.D.   On: 01/17/2019 15:08  Echo 01/12/2018 Study Conclusions   - Left ventricle: The cavity size was normal. Systolic function was    normal. The estimated ejection fraction was in the range of 55%    to 60%. Wall motion was normal; there were no regional wall    motion abnormalities.  - Mitral valve: There was moderate regurgitation.  - Left atrium: The atrium was mildly to moderately dilated.  - Right ventricle: The cavity size was mildly dilated.      Risk Assessment/Calculations:    CHA2DS2-VASc Score = 2  This indicates a 2.2% annual  risk of stroke. The patient's score is based upon: CHF History: No HTN History: Yes Diabetes History: No Stroke History: No Vascular Disease History: No Age Score: 1 Gender Score: 0       ASSESSMENT:    1. Permanent atrial fibrillation (HCC)   2. Stage 3a chronic kidney disease ([place])   3. Cancer of left colon (HCC)      PLAN:  In order of problems listed above:  Permanent atrial fibrillation on Eliquis and diltiazem, no bleeding problems, labs yesterday stable. Asymptomatic. Exercises regularly and HR controlled  Colon cancer with mets on new chemo that he's tolerating much better.  CKD stage III Crt 1.25 yest  Hypertension BP controlled  Shared Decision Making/Informed Consent        Medication Adjustments/Labs and Tests Ordered: Current medicines are reviewed at length with the patient today.  Concerns regarding medicines are outlined above.  Medication changes, Labs and Tests ordered today are listed in the Patient Instructions below. Patient Instructions  Medication Instructions:  Your physician recommends that you continue on your current medications as directed. Please refer to the Current Medication list given to you today.  *If you need a refill on your cardiac medications before your next appointment, please call your pharmacy*   Lab Work: NONE If you have labs (blood work) drawn today and your tests are completely normal, you will receive your results only by: [place] (if you have MyChart) OR A paper copy in the mail If you have any lab test that is abnormal or we need to change your treatment, we will call you to review the results.   Testing/Procedures: NONE   Follow-Up: At [facility], you and your health needs are our priority.  As part of our continuing mission to provide you with exceptional heart care, we have created designated Provider Care Teams.  These Care Teams include your primary Cardiologist (physician) and Advanced  Practice Providers (APPs -  Physician Assistants and Nurse Practitioners) who all  work together to provide you with the care you need, when you need it.  We recommend signing up for the patient portal called "MyChart".  Sign up information is provided on this After Visit Summary.  MyChart is used to connect with patients for Virtual Visits (Telemedicine).  Patients are able to view lab/test results, encounter notes, upcoming appointments, etc.  Non-urgent messages can be sent to your provider as well.   To learn more about what you can do with MyChart, go to [email or web address].    Your next appointment:   6 month(s)  The format for your next appointment:   In Person  Provider:   You may see DR. SKAINS or one of the following Advanced Practice Providers on your designated Care Team:   Cecilie Kicks, NP     Signed, Ermalinda Barrios, PA-C  02/04/2021 8:49 AM    [address], [place], [place]  [address] Phone: [phone]; Fax: [phone]

## 2021-02-03 ENCOUNTER — Encounter: Payer: Self-pay | Admitting: Hematology

## 2021-02-03 ENCOUNTER — Encounter: Payer: Self-pay | Admitting: Oncology

## 2021-02-04 ENCOUNTER — Ambulatory Visit (INDEPENDENT_AMBULATORY_CARE_PROVIDER_SITE_OTHER): Payer: Commercial Managed Care - PPO | Admitting: Physician Assistant

## 2021-02-04 ENCOUNTER — Other Ambulatory Visit: Payer: Self-pay

## 2021-02-04 ENCOUNTER — Encounter: Payer: Self-pay | Admitting: Physician Assistant

## 2021-02-04 VITALS — BP 140/84 | HR 87 | Ht 70.0 in | Wt 217.4 lb

## 2021-02-04 DIAGNOSIS — I4821 Permanent atrial fibrillation: Secondary | ICD-10-CM | POA: Diagnosis not present

## 2021-02-04 DIAGNOSIS — C186 Malignant neoplasm of descending colon: Secondary | ICD-10-CM

## 2021-02-04 DIAGNOSIS — N1831 Chronic kidney disease, stage 3a: Secondary | ICD-10-CM | POA: Diagnosis not present

## 2021-02-04 NOTE — Patient Instructions (Signed)
Medication Instructions:  Your physician recommends that you continue on your current medications as directed. Please refer to the Current Medication list given to you today.  *If you need a refill on your cardiac medications before your next appointment, please call your pharmacy*   Lab Work: NONE If you have labs (blood work) drawn today and your tests are completely normal, you will receive your results only by: [place] (if you have MyChart) OR A paper copy in the mail If you have any lab test that is abnormal or we need to change your treatment, we will call you to review the results.   Testing/Procedures: NONE   Follow-Up: At [facility], you and your health needs are our priority.  As part of our continuing mission to provide you with exceptional heart care, we have created designated Provider Care Teams.  These Care Teams include your primary Cardiologist (physician) and Advanced Practice Providers (APPs -  Physician Assistants and Nurse Practitioners) who all work together to provide you with the care you need, when you need it.  We recommend signing up for the patient portal called "MyChart".  Sign up information is provided on this After Visit Summary.  MyChart is used to connect with patients for Virtual Visits (Telemedicine).  Patients are able to view lab/test results, encounter notes, upcoming appointments, etc.  Non-urgent messages can be sent to your provider as well.   To learn more about what you can do with MyChart, go to [email or web address].    Your next appointment:   6 month(s)  The format for your next appointment:   In Person  Provider:   You may see DR. SKAINS or one of the following Advanced Practice Providers on your designated Care Team:   Cecilie Kicks, NP

## 2021-02-10 ENCOUNTER — Other Ambulatory Visit: Payer: Self-pay

## 2021-02-11 ENCOUNTER — Telehealth: Payer: Self-pay

## 2021-02-18 DIAGNOSIS — R109 Unspecified abdominal pain: Secondary | ICD-10-CM | POA: Diagnosis not present

## 2021-02-22 NOTE — Progress Notes (Signed)
[place]   [phone] [phone]   Clinic Follow up Note   Patient Care Team: [name], MD as PCP - General (Family Medicine) [name], MD as Consulting Physician (Cardiology) [name], MD as Consulting Physician (Gastroenterology) [name], MD as Consulting Physician (General Surgery) [name], MD as Consulting Physician (General Surgery) [name], MD as Consulting Physician (Urology) [name], MD as Consulting Physician (Medical Oncology) 02/23/2021  CHIEF COMPLAINT: Follow up metastatic colon cancer   SUMMARY OF ONCOLOGIC HISTORY: Oncology History Overview Note  Cancer Staging Cancer of left colon [facility]) Staging form: Colon and Rectum, AJCC 8th Edition - Pathologic stage from 01/11/2018: Stage IIIB (pT3, pN1c, cM0) - Signed by [name], MD on 01/16/2018     Cancer of left colon ([place])  01/10/2018 Imaging   CT AP W Contrast 01/10/18  IMPRESSION: Irregular soft tissue density causing stricture of the mid descending colon likely the site of obstruction for the dilated small bowel. 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 No evidence of perforation.   Equivocal findings involving the appendix measuring 1.2 cm at the appendiceal tip with mucosal enhancement. No adjacent free fluid or inflammatory change. Findings are nonspecific, but can be seen with early acute appendicitis.   Mild prostatic enlargement. Increased density over the posterior bladder base likely due to the large prostatic impression although cannot completely exclude a bladder mass. Urology protocol CT or ultrasound may be helpful for better evaluation.   Mild cholelithiasis.   Stable 1.5 cm cystic structure over the lower pole right kidney likely slightly hyperdense cyst.   Diverticulosis of the colon.   Aortic Atherosclerosis (ICD10-I70.0).   01/11/2018 Cancer Staging   Staging form: Colon and Rectum, AJCC 8th  Edition - Pathologic stage from 01/11/2018: Stage IIIB (pT3, pN1c, cM0) - Signed by [name], MD on 01/16/2018   01/11/2018 Surgery   LEFT COLON RESECTION, TAKEDOWN SPLENIC FLEXURE, COLOSTOMY by Dr. [name]    01/11/2018 Procedure   Colonoscopy 01/11/18 by Dr. [name]  - Malignant completely obstructing tumor in the mid descending colon. Tattooed. - Diverticulosis in the sigmoid colon. - Non-bleeding internal hemorrhoids. - No specimens collected.   01/11/2018 Pathology Results   Diagnosis 01/11/18  1. Colon, segmental resection for tumor, descending colon - INVASIVE COLORECTAL ADENOCARCINOMA, 4 CM. - TUMOR EXTENDS INTO PERICOLONIC CONNECTIVE TISSUE. - TUMOR FOCALLY INVOLVES RADIAL MARGIN. - ONE MESENTERIC TUMOR DEPOSIT. - THIRTEEN BENIGN LYMPH NODES (0/13). 2. Colon, segmental resection, splenic flexure - BENIGN COLON. - NO EVIDENCE OF MALIGNANCY .   01/11/2018 Tumor Marker   Baseline CEA at 3.4   01/16/2018 Initial Diagnosis   Cancer of left colon ([place])   01/23/2018 Imaging   CT CHEST WO CONTRAST IMPRESSION: 1. No evidence for metastatic disease within the chest. 2. Small left pleural effusion with underlying opacities which may represent atelectasis. Right basilar atelectasis. 3. Few foci of gas within the upper abdomen in the omentum with surrounding fat stranding, likely postsurgical 4. Aortic Atherosclerosis (ICD10-I70.0).   03/08/2018 - 05/15/2018 Chemotherapy   adjuvant FOLOFX. Due to side effects of neuropahty Oxaliplatin was stopped after 3 cycles and chemo was stopped after 6 cycles. He declined completing 6 months of chemo treatment.     03/19/2018 Imaging   03/19/2018 CT AP IMPRESSION: 1. Interval partial left hemicolectomy and descending colostomy. 2. Heterogeneous soft tissue density along the left anterior renal fascia is most likely postoperative (favor fat necrosis). No well-defined fluid collection. 3. Mild left lower  quadrant edema, new since 01/10/2018. This  could be postoperative. Superimposed sigmoid diverticulitis and/or cystitis cannot be excluded. 4. Subtle hyperenhancing nodule within the anterior bladder dome cannot be excluded. Consider nonemergent cystoscopy. When this is performed, recommend attention to the left ureterovesicular junction and distal left ureter to evaluate questionable soft tissue fullness. 5. Cholelithiasis. 6.  Aortic Atherosclerosis (ICD10-I70.0). 7. Prostatomegaly.   11/13/2018 Imaging   CT CAP WO Contrast 11/13/18  IMPRESSION: 1. Reversal of left lower quadrant colostomy with sigmoid colon anastomosis. No complicating features. No findings for residual or recurrent tumor or metastatic disease involving the chest, abdomen or pelvis without contrast. 2. No acute abdominal/pelvic findings. 3. Gallbladder sludge and gallstones but no findings for acute cholecystitis. 4. Stable anterior abdominal wall hernia. 5. The right testicle is in the right inguinal canal.   10/03/2019 Imaging   CT CAP WO contrast  IMPRESSION: 1. New rounded density interposed between the prostate gland and anterior upper rectal wall could represent adenopathy or local extension of anterior rectal tumor. 2. Marked prostatomegaly, prostate volume 150 cubic cm. 3. Other imaging findings of potential clinical significance: Aortic Atherosclerosis (ICD10-I70.0). Coronary atherosclerosis. Trace right pleural effusion. Suspected cholelithiasis. Nonobstructive left nephrolithiasis. Multilevel lumbar impingement. Bilateral mildly retracted testicles. Hypodense exophytic lesion of the right kidney, most likely to be a cyst.   11/02/2019 Procedure   colonoscopy on 11/02/2019 by Dr. [name] Plan showed normal digital rectal exam, 3 polyps in the rectum, descending colon and cecum, and a prior sigmoid: Anastomosis characterized by erythema.  He found an extrinsic nonobstructing medium-sized mass in the proximal rectum about 4 cm in length, no internal  rectal mass.   Diagnosis Surgical [P], colon, cecum, descending, rectal, polyp (3) - TUBULAR ADENOMA (TWO) - NO HIGH GRADE DYSPLASIA OR CARCINOMA. - COLONIC FRAGMENT WITH BENIGN LYMPHOID AGGREGATE.   12/07/2019 Imaging   MRI pelvis IMPRESSION: 1. Masslike area in the rectum suspicious for rectal neoplasm, likely T4b based on the appearance of soft tissue extending along the anterior peritoneal reflection and into the seminal vesicles. Correlation with recent colonoscopy results may be helpful. Area of anastomosis and other areas of the pelvis are not imaged on today's exam.   12/21/2019 PET scan   IMPRESSION: 1. Unfortunately evidence for peritoneal metastasis. Intensely hypermetabolic nodules along the LEFT pericolic gutter. Favor hypermetabolic mass anterior to the rectum to represent serosal implant along the ventral surface of the rectum. 2. local recurrence within the LEFT abdominal wall at site prior colostomy. Intense hypermetabolic thickening of the rectus muscle at this site. 3. Two hypermetabolic hepatic metastasis.   01/15/2020 Relapse/Recurrence   FINAL MICROSCOPIC DIAGNOSIS:   A. SOFT TISSUE, LEFT ABDOMINAL WALL, BIOPSY:  - Adenocarcinoma.  - See comment.   COMMENT:   The morphology is consistent with metastatic colorectal adenocarcinoma.    01/28/2020 -  Chemotherapy   First line FOLFIRI q2weeks starting 01/28/20 for 8 cycles.  -----Bevacizumab added with C2.  -----Changed to maintenance Xeloda 2000 mg twice daily for 2 weeks on/1 week off and bevacizumab 06/02/20. Starting with C3, dose reduce to 1559m BID due to skin toxicity.   05/05/2020 Imaging   IMPRESSION: 1. Improved appearance, with reduced size of the hepatic metastatic lesions and reduced size of the peritoneal tumor implants. 2. Other imaging findings of potential clinical significance: Notable prostatomegaly. Multilevel impingement in the lumbar spine. Dependent density in the gallbladder  possibly from sludge or gallstones. Degenerative glenohumeral arthropathy bilaterally. 3. Aortic atherosclerosis.   08/15/2020 Imaging   CT CAP  IMPRESSION: Chest Impression:   No evidence of thoracic metastasis   Abdomen / Pelvis Impression:   1. Hepatic metastasis are no longer measurable by CT imaging. 2. Peritoneal nodular metastasis in the LEFT abdomen are decreased in size. 3. No evidence of new peritoneal disease. 4. Thickening and LEFT rectus muscle at site of prior metastasis. No interval change. 5. No evidence of new or progressive colorectal carcinoma.     11/27/2020 Imaging   CT CAP  IMPRESSION: 1. There are multiple small pulmonary nodules in the right upper lobe that are new or enlarged compared to prior examination but measuring 4 mm or smaller, suspicious for pulmonary metastatic disease. 2. Unchanged peritoneal nodule adjacent to the tip of the spleen measuring 1.3 x 1.2 cm. Slightly peritoneal nodule adjacent to the splenic flexure measuring no greater than 6 mm, previously 8 mm. 3. No significant change in previously hypermetabolic soft tissue mass centered about the left lower quadrant colostomy site. 4. Previously established hypermetabolic hepatic metastatic disease remains inapparent by CT. 5. Status post sigmoid colon resection and reanastomosis. 6. Prostatomegaly with thickening of the decompressed urinary bladder, likely secondary to chronic outlet obstruction. 7. Cholelithiasis.   Aortic Atherosclerosis (ICD10-I70.0).   01/29/2021 Imaging   CT CAP   IMPRESSION: 1. Minimal interval progression of bilateral pulmonary nodules, concerning for metastatic disease. 2. No substantial change in size of the peritoneal nodule at the inferior tip of the spleen and splenic flexure nodule. 3. Nodular soft tissue measured at the ostomy site previously is not evident today. 4. Marked prostatomegaly. 5. Cholelithiasis. 6. Aortic Atherosclerosis  (ICD10-I70.0).     CURRENT THERAPY:  First line FOLFIRI q2weeks starting 01/28/20 for 8 cycles. Bevacizumab added with C2. Changed to maintenance Xeloda 2000 mg twice daily for 2 weeks on/1 week off and bevacizumab 06/02/20. Continue Beva every 3 weeks. C3 dose reduce to 1563m BID due to skin toxicity.   INTERVAL HISTORY: Mr. BGendronreturns for follow up and treatment as scheduled. He was last seen by Dr. FBurr Medicoand began cycle 3 Xeloda on 02/02/21, avastin was held for proteinuria (last avastin dose 01/12/21). He was seen by surgeon Dr. ARalene Ok9/14 for abdominal wall pain on standing and is planning to undergo exploratory stitch removal and scar tissue revision, the date is not set but he hopes that will be next week.  He began next cycle of Xeloda this morning on/19/22.  He tolerates well, hands and feet get dry and stiff mostly in the morning.  He remains functional without difficulty.  Energy and appetite are adequate.  Denies nausea/vomiting, constipation, diarrhea, bleeding, new or worsening pain, fever, chills, cough, chest pain, dyspnea, or other new concerns.   MEDICAL HISTORY:  Past Medical History:  Diagnosis Date   Aden[place] colon [place]   Anemia    taking iron supplements   Anxiety    Atrial fibrillation with RVR ([facility])    [place] 01/10[place]2018   Depression    Diverticulitis    Dysrhythmia    afib   History of kidney stones    Hypertension     SURGICAL HISTORY: Past Surgical History:  Procedure Laterality Date   ANKLE SURGERY Left    when he was in college   COLON RESECTION N/A 01/11/2018   Procedure: LEFT COLON RESECTION, TAKEDOWN SPLENIC FLEXURE, COLOSTOMY;  Surgeon: IFanny Skates MD;  Location: [facility];  Service: General;  Laterality: N/A;   COLONOSCOPY  01/11/2018   Procedure: COLONOSCOPY;  Surgeon: GJackquline Denmark  MD;  Location: [facility];  Service: Endoscopy;;   COLONOSCOPY  05/10/2018   colonscopy  05/10/2018   HERNIA REPAIR      HERNIA REPAIR  03/02/2019   EXPLORATORY LAPAROTOMY (N/A Abdomen)   INCISIONAL HERNIA REPAIR N/A 03/02/2019   Procedure: INCISIONAL HERNIA REPAIR , RECTORECTUS VS TAR HERNIA REPAIR;  Surgeon: Ralene Ok, MD;  Location: [place];  Service: General;  Laterality: N/A;   INSERTION OF MESH N/A 03/02/2019   Procedure: Insertion Of Mesh;  Surgeon: Ralene Ok, MD;  Location: [place];  Service: General;  Laterality: N/A;   LAPAROTOMY N/A 03/02/2019   Procedure: EXPLORATORY LAPAROTOMY;  Surgeon: Ralene Ok, MD;  Location: [place];  Service: General;  Laterality: N/A;   LYSIS OF ADHESION N/A 06/12/2018   Procedure: LYSIS OF ADHESIONS;  Surgeon: [name], MD;  Location: [facility];  Service: General;  Laterality: N/A;   LYSIS OF ADHESION N/A 03/02/2019   Procedure: Lysis Of Adhesion;  Surgeon: Ralene Ok, MD;  Location: [place];  Service: General;  Laterality: N/A;   PORTACATH PLACEMENT Right 03/07/2018   Procedure: INSERTION PORT-A-CATH RIGHT SUBCLAVIAN;  Surgeon: [name], MD;  Location: [place];  Service: General;  Laterality: Right;   PROCTOSCOPY N/A 06/12/2018   Procedure: RIGID PROCTOSCOPY;  Surgeon: [name], MD;  Location: [facility];  Service: General;  Laterality: N/A;   thumb surgery   2018   cyst removal    I have reviewed the social history and family history with the patient and they are unchanged from previous note.  ALLERGIES:  has No Known Allergies.  MEDICATIONS:  Current Outpatient Medications  Medication Sig Dispense Refill   apixaban (ELIQUIS) 5 MG TABS tablet Take 1 tablet (5 mg total) by mouth 2 (two) times daily. 60 tablet 0   b complex vitamins tablet Take 1 tablet by mouth daily.     capecitabine (XELODA) 500 MG tablet TAKE 3 TABLETS BY MOUTH  EVERY 12 HOURS WITHIN 30  MINUTES OF A MEAL FOR 14  DAYS ON THEN 7 DAYS OFF 84 tablet 1   diltiazem (CARDIZEM CD) 360 MG 24 hr capsule Take 1 capsule (360 mg total) by mouth daily. 90 capsule 3   diphenoxylate-atropine  (LOMOTIL) 2.5-0.025 MG tablet Take 1-2 tablets by mouth 4 (four) times daily as needed for diarrhea or loose stools. 60 tablet 1   docusate sodium (COLACE) 100 MG capsule Take 100 mg by mouth 2 (two) times daily.     levothyroxine (SYNTHROID) 75 MCG tablet TAKE 1 TABLET(75 MCG) BY MOUTH DAILY 90 tablet 1   lidocaine (LIDODERM) 5 % Place 1 patch onto the skin daily. Remove & Discard patch within 12 hours or as directed by MD 30 patch 0   lidocaine-prilocaine (EMLA) cream Apply 1 application topically as needed. 30 g 1   Multiple Vitamins-Iron (MULTIVITAMIN/IRON PO) Take 1 tablet by mouth daily.      prochlorperazine (COMPAZINE) 10 MG tablet Take 1 tablet (10 mg total) by mouth every 6 (six) hours as needed for nausea or vomiting. 30 tablet 0   tamsulosin (FLOMAX) 0.4 MG CAPS capsule Take 0.4 mg by mouth 2 (two) times daily.      urea (CARMOL) 10 % cream Apply topically as needed. 142 g 2   No current facility-administered medications for this visit.    PHYSICAL EXAMINATION: ECOG PERFORMANCE STATUS: 1 - Symptomatic but completely ambulatory  Vitals:   02/23/21 0947  BP: 124/76  Pulse: 74  Resp: 18  Temp:  98 F (36.7 C)  SpO2: 99%   Filed Weights   02/23/21 0947  Weight: 219 lb 9.6 oz (99.6 kg)    GENERAL:alert, no distress and comfortable SKIN: No rash.  Palms are dry without erythema EYES: sclera clear NECK: Without mass LUNGS: clear with normal breathing effort HEART: A. Fib, no lower extremity edema ABDOMEN: abdomen soft, normal bowel sounds.  Firmness in the left upper quadrant NEURO: alert & oriented x 3 with fluent speech, no focal motor/sensory deficits PAC without erythema  LABORATORY DATA:  I have reviewed the data as listed CBC Latest Ref Rng & Units 02/23/2021 02/02/2021 01/12/2021  WBC 4.0 - 10.5 K/uL 5.6 5.2 5.1  Hemoglobin 13.0 - 17.0 g/dL 14.5 14.3 14.1  Hematocrit 39.0 - 52.0 % 42.4 40.5 41.1  Platelets 150 - 400 K/uL 144(L) 191 174     CMP Latest Ref Rng &  Units 02/23/2021 02/02/2021 01/12/2021  Glucose 70 - 99 mg/dL 104(H) 114(H) 124(H)  BUN 8 - 23 mg/dL _0 Creatinine 0.61 - 1.24 mg/dL 1.18 1.25(H) 1.30(H)  Sodium 135 - 145 mmol/L 141 142 141  Potassium 3.5 - 5.1 mmol/L 4.0 4.0 4.1  Chloride 98 - 111 mmol/L 106 106 106  CO2 22 - 32 mmol/L _1 Calcium 8.9 - 10.3 mg/dL 9.2 9.1 9.2  Total Protein 6.5 - 8.1 g/dL 7.0 7.1 6.7  Total Bilirubin 0.3 - 1.2 mg/dL 0.9 1.0 1.2  Alkaline Phos 38 - 126 U/L 98 105 94  AST 15 - 41 U/L _2 ALT 0 - 44 U/L _3 RADIOGRAPHIC STUDIES: I have personally reviewed the radiological images as listed and agreed with the findings in the report. No results found.   ASSESSMENT & PLAN: Dave Vaughn is a 75 y.o. male with      1. Cancer of left colon, adenocarcinoma, stage IIIB (pT3N1cM0), MSI-stable, KRAS+ -Diagnosed in 01/2018. Treated with surgery and adjuvant FOLFOX. Due to side effects Oxaliplatin was stopped after 3 cycles and chemo was stopped after 6 cycles. He declined completing 6 months of chemo treatment.  -unfortunately, he developed local recurrence in left abdominal wall and peritoneal metastasis, 2 hypermetabolic liver lets in 0/2585. Abdominal wall biopsy conformed metastatic colorectal adenocarcinoma in 01/2020  -Began first line dose-reduced FOLFIRI on 01/28/2020 -[facility] showed Kras (+) positive, he is not a candidate for EGFR inhibitor.  Bevacizumab was added with cycle 2  -S/p 8 cycles of FOLFIRI and bevacizumab from cycle 2; tolerated well with fatigue, nausea, diarrhea. CEA normalized after cycle 4, then switched to maintenance xeloda and bevacizumab  -Cycle 2 Xeloda reduced due to skin toxicity (skin rash to his forearms and sensitivity to his feet) -CT CAP 01/29/2021 showed overall stable disease, continue Xeloda 2 weeks on/1 week off and Beva every 3 weeks when urine protein less than 300   2. Hypothyroidism -Previously on synthroid, TSH normal 2 years ago.  He has not been taking synthroid for a while -TSH in 02/2020 up to 67, he went back on Synthroid since 02/16/2020   3.  BPH  -Followed by urologist Dr. Lovena Neighbours -Pelvic MRI on 7/1 shows marked prostatomegaly with signs of BPH extending into the bladder base.  -Denies new or worsening urinary symptoms   4. Atrial Fibrillation -continue Eliquis. Rate controlled.  He can stop Eliquis periodically for bleeding hemorrhoids -Continue follow-up with cardiology -Plan to hold Eliquis for 5 days prior to upcoming  abdominal surgery   5. CKD stage III -he has developed mild increased Cr since he started chemo, EGFR around 40-50's  -I encouraged him to control his blood pressure, cholesterol and BG. I also encouraged him to drink plenty of water.  -Will avoid NSAIDs and CT IV contrast if GFR low -Improved today   Disposition: Mr. Mcqueary appears stable.  He continues Xeloda 1500 mg twice daily 2 weeks on/1 week off, begin the current cycle today 02/23/2021, and q3 week bevacizumab when urine protein less than 300.  He tolerates treatment well with mild hand-foot syndrome.  Side effects are well managed with supportive care at home, I encouraged him to use hydrocortisone and urea skin cream.  He is otherwise able to recover and function well.  There is no clinical evidence of disease progression.  He plans to undergo exploratory surgery for stitch removal and scar tissue revision by Dr. Rosendo Gros this month.  We will hold bevacizumab for upcoming abdominal surgery, last dose was 01/12/2021.  He will take Xeloda for 7 days then off for surgery.  Follow-up in 03/16/2021 as scheduled to resume treatment if he has healed well.  I will CC my note to Dr. Rosendo Gros.  The plan was discussed with Dr. Burr Medico.  All questions were answered. The patient knows to call the clinic with any problems, questions or concerns. No barriers to learning were detected.  Total encounter time was 30 minutes.      Alla Feeling,  NP 02/23/21

## 2021-02-23 ENCOUNTER — Inpatient Hospital Stay: Payer: Commercial Managed Care - PPO

## 2021-02-23 ENCOUNTER — Encounter: Payer: Self-pay | Admitting: Nurse Practitioner

## 2021-02-23 ENCOUNTER — Other Ambulatory Visit: Payer: Self-pay

## 2021-02-23 ENCOUNTER — Inpatient Hospital Stay: Payer: Commercial Managed Care - PPO | Attending: Nurse Practitioner | Admitting: Nurse Practitioner

## 2021-02-23 VITALS — BP 124/76 | HR 74 | Temp 98.0°F | Resp 18 | Ht 70.0 in | Wt 219.6 lb

## 2021-02-23 DIAGNOSIS — N183 Chronic kidney disease, stage 3 unspecified: Secondary | ICD-10-CM | POA: Insufficient documentation

## 2021-02-23 DIAGNOSIS — Z933 Colostomy status: Secondary | ICD-10-CM | POA: Diagnosis not present

## 2021-02-23 DIAGNOSIS — C186 Malignant neoplasm of descending colon: Secondary | ICD-10-CM | POA: Insufficient documentation

## 2021-02-23 DIAGNOSIS — C787 Secondary malignant neoplasm of liver and intrahepatic bile duct: Secondary | ICD-10-CM | POA: Insufficient documentation

## 2021-02-23 DIAGNOSIS — Z79890 Hormone replacement therapy (postmenopausal): Secondary | ICD-10-CM | POA: Insufficient documentation

## 2021-02-23 DIAGNOSIS — I700 Atherosclerosis of aorta: Secondary | ICD-10-CM | POA: Insufficient documentation

## 2021-02-23 DIAGNOSIS — E039 Hypothyroidism, unspecified: Secondary | ICD-10-CM | POA: Diagnosis not present

## 2021-02-23 DIAGNOSIS — D120 Benign neoplasm of cecum: Secondary | ICD-10-CM | POA: Diagnosis not present

## 2021-02-23 DIAGNOSIS — K648 Other hemorrhoids: Secondary | ICD-10-CM | POA: Insufficient documentation

## 2021-02-23 DIAGNOSIS — C786 Secondary malignant neoplasm of retroperitoneum and peritoneum: Secondary | ICD-10-CM | POA: Diagnosis not present

## 2021-02-23 DIAGNOSIS — Z79899 Other long term (current) drug therapy: Secondary | ICD-10-CM | POA: Insufficient documentation

## 2021-02-23 DIAGNOSIS — N400 Enlarged prostate without lower urinary tract symptoms: Secondary | ICD-10-CM | POA: Diagnosis not present

## 2021-02-23 DIAGNOSIS — I2510 Atherosclerotic heart disease of native coronary artery without angina pectoris: Secondary | ICD-10-CM | POA: Diagnosis not present

## 2021-02-23 DIAGNOSIS — N200 Calculus of kidney: Secondary | ICD-10-CM | POA: Insufficient documentation

## 2021-02-23 DIAGNOSIS — Z87442 Personal history of urinary calculi: Secondary | ICD-10-CM | POA: Diagnosis not present

## 2021-02-23 DIAGNOSIS — Z95828 Presence of other vascular implants and grafts: Secondary | ICD-10-CM

## 2021-02-23 DIAGNOSIS — K8020 Calculus of gallbladder without cholecystitis without obstruction: Secondary | ICD-10-CM | POA: Diagnosis not present

## 2021-02-23 DIAGNOSIS — J90 Pleural effusion, not elsewhere classified: Secondary | ICD-10-CM | POA: Diagnosis not present

## 2021-02-23 DIAGNOSIS — I4891 Unspecified atrial fibrillation: Secondary | ICD-10-CM | POA: Diagnosis not present

## 2021-02-23 DIAGNOSIS — R809 Proteinuria, unspecified: Secondary | ICD-10-CM | POA: Insufficient documentation

## 2021-02-23 DIAGNOSIS — Z7901 Long term (current) use of anticoagulants: Secondary | ICD-10-CM | POA: Insufficient documentation

## 2021-02-23 LAB — CBC WITH DIFFERENTIAL (CANCER CENTER ONLY)
Abs Immature Granulocytes: 0.01 10*3/uL (ref 0.00–0.07)
Basophils Absolute: 0 10*3/uL (ref 0.0–0.1)
Basophils Relative: 1 %
Eosinophils Absolute: 0.2 10*3/uL (ref 0.0–0.5)
Eosinophils Relative: 3 %
HCT: 42.4 % (ref 39.0–52.0)
Hemoglobin: 14.5 g/dL (ref 13.0–17.0)
Immature Granulocytes: 0 %
Lymphocytes Relative: 34 %
Lymphs Abs: 1.9 10*3/uL (ref 0.7–4.0)
MCH: 34.4 pg — ABNORMAL HIGH (ref 26.0–34.0)
MCHC: 34.2 g/dL (ref 30.0–36.0)
MCV: 100.7 fL — ABNORMAL HIGH (ref 80.0–100.0)
Monocytes Absolute: 0.5 10*3/uL (ref 0.1–1.0)
Monocytes Relative: 8 %
Neutro Abs: 3 10*3/uL (ref 1.7–7.7)
Neutrophils Relative %: 54 %
Platelet Count: 144 10*3/uL — ABNORMAL LOW (ref 150–400)
RBC: 4.21 MIL/uL — ABNORMAL LOW (ref 4.22–5.81)
RDW: 15 % (ref 11.5–15.5)
WBC Count: 5.6 10*3/uL (ref 4.0–10.5)
nRBC: 0 % (ref 0.0–0.2)

## 2021-02-23 LAB — CMP (CANCER CENTER ONLY)
ALT: 14 U/L (ref 0–44)
AST: 21 U/L (ref 15–41)
Albumin: 4.1 g/dL (ref 3.5–5.0)
Alkaline Phosphatase: 98 U/L (ref 38–126)
Anion gap: 11 (ref 5–15)
BUN: 17 mg/dL (ref 8–23)
CO2: 24 mmol/L (ref 22–32)
Calcium: 9.2 mg/dL (ref 8.9–10.3)
Chloride: 106 mmol/L (ref 98–111)
Creatinine: 1.18 mg/dL (ref 0.61–1.24)
GFR, Estimated: >60 mL/min (ref >60)
Glucose, Bld: 104 mg/dL — ABNORMAL HIGH (ref 70–99)
Potassium: 4 mmol/L (ref 3.5–5.1)
Sodium: 141 mmol/L (ref 135–145)
Total Bilirubin: 0.9 mg/dL (ref 0.3–1.2)
Total Protein: 7 g/dL (ref 6.5–8.1)

## 2021-02-23 LAB — CEA (IN HOUSE-CHCC): CEA (CHCC-In House): 6.59 ng/mL — ABNORMAL HIGH (ref 0.00–5.00)

## 2021-02-23 LAB — TOTAL PROTEIN, URINE DIPSTICK: Protein, ur: 100 mg/dL — AB

## 2021-02-23 MED ORDER — SODIUM CHLORIDE 0.9% FLUSH
10.0000 mL | INTRAVENOUS | Status: DC | PRN
  Administered 2021-02-23: 10 mL

## 2021-02-24 ENCOUNTER — Encounter: Payer: Self-pay | Admitting: Oncology

## 2021-02-24 ENCOUNTER — Telehealth: Payer: Self-pay | Admitting: Nurse Practitioner

## 2021-02-24 ENCOUNTER — Encounter: Payer: Self-pay | Admitting: Hematology

## 2021-02-24 NOTE — Telephone Encounter (Signed)
Left message with follow-up appointment per 9/19 los. 

## 2021-03-03 ENCOUNTER — Encounter: Payer: Self-pay | Admitting: Oncology

## 2021-03-03 ENCOUNTER — Encounter: Payer: Self-pay | Admitting: Hematology

## 2021-03-03 NOTE — Progress Notes (Signed)
Surgical Instructions    Your procedure is scheduled on Monday, October 3rd, 2022.   Report to [facility] Main Entrance "A" at 05:30 A.M., then check in with the Admitting office.  Call this number if you have problems the morning of surgery:  [phone]   If you have any questions prior to your surgery date call [phone]: Open Monday-Friday 8am-4pm    Remember:  Do not eat after midnight the night before your surgery  You may drink clear liquids until 04:30 the morning of your surgery.   Clear liquids allowed are: Water, Non-Citrus Juices (without pulp), Carbonated Beverages, Clear Tea, Black Coffee ONLY (NO MILK, CREAM OR POWDERED CREAMER of any kind), and Gatorade    Take these medicines the morning of surgery with A SIP OF WATER:  diltiazem (CARDIZEM CD)  levothyroxine (SYNTHROID)  tamsulosin (FLOMAX)   If needed:   prochlorperazine (COMPAZINE)  diphenoxylate-atropine (LOMOTIL)  Ask MD if you need to stop taking capecitabine (XELODA) before surgery  Follow your surgeon's instructions on when to stop Eliquis.  If no instructions were given by your surgeon then you will need to call the office to get those instructions.     As of today, STOP taking any Aspirin (unless otherwise instructed by your surgeon) Aleve, Naproxen, Ibuprofen, Motrin, Advil, Goody's, BC's, all herbal medications, fish oil, and all vitamins.           Do not wear jewelry  Do not wear lotions, powders, colognes, or deodorant. Men may shave face and neck. Do not bring valuables to the hospital.              [facility] is not responsible for any belongings or valuables.  Do NOT Smoke (Tobacco/Vaping)  24 hours prior to your procedure If you use a CPAP at night, you may bring your mask for your overnight stay.   Contacts, glasses, dentures or bridgework may not be worn into surgery, please bring cases for these belongings   For patients admitted to the hospital, discharge time will be  determined by your treatment team.   Patients discharged the day of surgery will not be allowed to drive home, and someone needs to stay with them for 24 hours.  NO VISITORS WILL BE ALLOWED IN PRE-OP WHERE PATIENTS GET READY FOR SURGERY.  ONLY 1 SUPPORT PERSON MAY BE PRESENT WHILE YOU ARE IN SURGERY.  IF YOU ARE TO BE ADMITTED, ONCE YOU ARE IN YOUR ROOM YOU WILL BE ALLOWED TWO (2) VISITORS.  Minor children may have two parents present. Special consideration for safety and communication needs will be reviewed on a case by case basis.  Special instructions:    Oral Hygiene is also important to reduce your risk of infection.  Remember - BRUSH YOUR TEETH THE MORNING OF SURGERY WITH YOUR REGULAR TOOTHPASTE   [place]- Preparing For Surgery  Before surgery, you can play an important role. Because skin is not sterile, your skin needs to be as free of germs as possible. You can reduce the number of germs on your skin by washing with CHG (chlorahexidine gluconate) Soap before surgery.  CHG is an antiseptic cleaner which kills germs and bonds with the skin to continue killing germs even after washing.     Please do not use if you have an allergy to CHG or antibacterial soaps. If your skin becomes reddened/irritated stop using the CHG.  Do not shave (including legs and underarms) for at least 48 hours prior to first CHG  shower. It is OK to shave your face.  Please follow these instructions carefully.     Shower the NIGHT BEFORE SURGERY and the MORNING OF SURGERY with CHG Soap.   If you chose to wash your hair, wash your hair first as usual with your normal shampoo. After you shampoo, rinse your hair and body thoroughly to remove the shampoo.  Then [employer] and genitals (private parts) with your normal soap and rinse thoroughly to remove soap.  After that Use CHG Soap as you would any other liquid soap. You can apply CHG directly to the skin and wash gently with a scrungie or a clean washcloth.    Apply the CHG Soap to your body ONLY FROM THE NECK DOWN.  Do not use on open wounds or open sores. Avoid contact with your eyes, ears, mouth and genitals (private parts). Wash Face and genitals (private parts)  with your normal soap.   Wash thoroughly, paying special attention to the area where your surgery will be performed.  Thoroughly rinse your body with warm water from the neck down.  DO NOT shower/wash with your normal soap after using and rinsing off the CHG Soap.  Pat yourself dry with a CLEAN TOWEL.  Wear CLEAN PAJAMAS to bed the night before surgery  Place CLEAN SHEETS on your bed the night before your surgery  DO NOT SLEEP WITH PETS.   Day of Surgery:  Take a shower with CHG soap. Wear Clean/Comfortable clothing the morning of surgery Do not apply any deodorants/lotions.   Remember to brush your teeth WITH YOUR REGULAR TOOTHPASTE.   Please read over the following fact sheets that you were given.

## 2021-03-03 NOTE — Progress Notes (Signed)
This Probation officer called Dr. Rosendo Gros office for pre-op orders. RN said that Dr. Rosendo Gros is unavailable until 03/05/2021.

## 2021-03-04 ENCOUNTER — Encounter (HOSPITAL_COMMUNITY)
Admission: RE | Admit: 2021-03-04 | Discharge: 2021-03-04 | Disposition: A | Discharge status: Home / Self Care | Payer: Commercial Managed Care - PPO | Source: Ambulatory Visit | Attending: General Surgery | Admitting: General Surgery

## 2021-03-04 ENCOUNTER — Encounter (HOSPITAL_COMMUNITY): Payer: Self-pay

## 2021-03-04 ENCOUNTER — Other Ambulatory Visit: Payer: Self-pay

## 2021-03-04 DIAGNOSIS — X58XXXA Exposure to other specified factors, initial encounter: Secondary | ICD-10-CM | POA: Diagnosis not present

## 2021-03-04 DIAGNOSIS — Z01818 Encounter for other preprocedural examination: Secondary | ICD-10-CM | POA: Diagnosis not present

## 2021-03-04 DIAGNOSIS — S30851A Superficial foreign body of abdominal wall, initial encounter: Secondary | ICD-10-CM | POA: Diagnosis not present

## 2021-03-04 NOTE — Progress Notes (Signed)
PCP - Lorenda Ishihara MD Cardiologist - Candee Furbish MD  PPM/ICD - denies Device Orders -  Rep Notified -   Chest x-ray -none  EKG - 05/09/20 Stress Test - 01/2019 ECHO - 01/12/18 Cardiac Cath -   Sleep Study - no CPAP - no  Fasting Blood Sugar - n/a Checks Blood Sugar _____ times a day  Blood Thinner Instructions:pt states LD Eliquis 9/25 per Dr. Rosendo Gros instructions. Also states LD Xeloda was 9/25 per Dr. Johney Frame instructions. Aspirin Instructions:N/A  ERAS Protcol - yes -clear liquids until 0430 PRE-SURGERY Ensure or G2-   COVID TEST- no;ambulatory surgery    Anesthesia review: yes -cardiac history;chemo 02/23/21  Patient denies shortness of breath, fever, cough and chest pain at PAT appointment   All instructions explained to the patient, with a verbal understanding of the material. Patient agrees to go over the instructions while at home for a better understanding. Patient also instructed to self quarantine after being tested for COVID-19. The opportunity to ask questions was provided.

## 2021-03-04 NOTE — Progress Notes (Signed)
Anesthesia APP Note:  Case: [id number] Date/Time: 03/09/21 0715   Procedures:      EXPLORATION OF ABDOMINAL WALL     EXCISION OF FOREIGN BODY   Anesthesia type: Monitor Anesthesia Care   Pre-op diagnosis: ABDOMINAL WALL PAIN   Location: [place] OR ROOM 02 / [facility] OR   Surgeons: Ralene Ok, MD       DISCUSSION: Patient is a 75 year old male scheduled for the above procedure. He has a history of colon cancer diagnosed in 2019, s/p multiple surgeries as outlined below. At 02/18/21 follow-up he was noted to have scar tissue or transapical sutures causing some pain and discomfort. Local exploration with excision of scar or suture planned.   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 BMi is consistent with obesity.  Last oncology visit 02/23/21 with Cira Rue, NP. Last Xeloda cycles started 02/23/21. Avastin on hold hold for proteinuria, last dose 01/12/21. She wrote, "He plans to undergo exploratory surgery for stitch removal and scar tissue revision by Dr. Rosendo Gros this month.  We will hold bevacizumab for upcoming abdominal surgery, last dose was 01/12/2021.  He will take Xeloda for 7 days then off for surgery."  Last cardiology visit 02/04/21 with Ermalinda Barrios, PA-C. HR well controlled in the 70's on diltiazem. He was running 1-3 miles several times a week, lifting weights, riding horses and playing golf. He monitor his HR with his smart watch. Six month follow-up planned.   He reported  instructions to hold Xeloda and Eliquis after 03/01/21. Discussed that Eliquis typically only held for 72 hours prior to surgery. He says he continues to monitor his HR through his watch and reports overall good rate control.   Surgeon orders were not available at PAT visit. Anesthesia team to evaluate on the day of surgery.    VS: BP (!) 152/69   Pulse 64   Temp 36.7 C (Oral)   Resp 17   Ht 5\' 11"  (1.803 m)   Wt 100.8 kg   SpO2 100%   BMI 31.00 kg/m    PROVIDERS: [name], MD is PCP Candee Furbish, MD is cardiologist. Previously he saw [name], MD. [name], MD is Teryl Lucy, MD is GI  Ellison Hughs, MD is GU    LABS: Labs from 02/23/21 noted reviewed. Results include: Lab Results  Component Value Date   WBC 5.6 02/23/2021   HGB 14.5 02/23/2021   HCT 42.4 02/23/2021   PLT 144 (L) 02/23/2021   GLUCOSE 104 (H) 02/23/2021   ALT 14 02/23/2021   AST 21 02/23/2021   NA 141 02/23/2021   K 4.0 02/23/2021   CL 106 02/23/2021   CREATININE 1.18 02/23/2021   BUN 17 02/23/2021   CO2 24 02/23/2021   HGBA1C 5.1 06/09/2018     IMAGES: CT Chest/abd/pelvis 01/29/21: IMPRESSION: 1. Minimal interval progression of bilateral pulmonary nodules, concerning for metastatic disease. 2. No substantial change in size of the peritoneal nodule at the inferior tip of the spleen and splenic flexure nodule. 3. Nodular soft tissue measured at the ostomy site previously is not evident today. 4. Marked prostatomegaly. 5.  Cholelithiasis. 6. Aortic Atherosclerosis (ICD10-I70.0).    EKG: 05/09/20: Afib at 81 bpm   CV: Nuclear stress test 01/17/19: IMPRESSION: 1. Small region of mild to moderate reversible ischemia in the apical segment anterior wall. 2. Normal left ventricular wall motion. 3. Left ventricular ejection fraction 47% 4. Non invasive risk stratification*: Intermediate *2012 Appropriate Use Criteria for Coronary Revascularization Focused  Update: J Am Coll Cardiol. 9326;71(2):458-099. http://content.airportbarriers.com.aspx?articleid=1201161     Echo 01/12/18: Study Conclusions - Left ventricle: The cavity size was normal. Systolic function was   normal. The estimated ejection fraction was in the range of 55%   to 60%. Wall motion was normal; there were no regional wall   motion abnormalities. - Mitral valve: There was moderate regurgitation. - Left atrium: The atrium was mildly to moderately dilated. - Right ventricle: The cavity size was mildly dilated.   Past Medical History:  Diagnosis Date   Adenocarcinoma, colon ([place]) dx'd 01/2018   Anemia    taking iron supplements   Anxiety    Atrial fibrillation with RVR (HCC)    Colonic obstruction ([place]) 01/10/2018   Depression    Diverticulitis    Dysrhythmia    afib   History of kidney stones    Hypertension     Past Surgical History:  Procedure Laterality Date   ANKLE SURGERY Left    when he was in college   COLON RESECTION N/A 01/11/2018   Procedure: LEFT COLON RESECTION, TAKEDOWN SPLENIC FLEXURE, COLOSTOMY;  Surgeon: [name], MD;  Location: [facility];  Service: General;  Laterality: N/A;   COLONOSCOPY  01/11/2018   Procedure: COLONOSCOPY;  Surgeon: Jackquline Denmark, MD;  Location: [facility];  Service: Endoscopy;;   COLONOSCOPY  05/10/2018   colonscopy  05/10/2018   HERNIA REPAIR     HERNIA REPAIR  03/02/2019   EXPLORATORY LAPAROTOMY (N/A Abdomen)   INCISIONAL HERNIA REPAIR N/A 03/02/2019   Procedure: INCISIONAL HERNIA REPAIR , RECTORECTUS VS TAR HERNIA REPAIR;  Surgeon: Ralene Ok, MD;  Location: [place];  Service: General;  Laterality: N/A;   INSERTION OF MESH N/A 03/02/2019   Procedure: Insertion Of Mesh;  Surgeon: Ralene Ok, MD;  Location: [place];  Service: General;  Laterality: N/A;   LAPAROTOMY N/A 03/02/2019   Procedure: EXPLORATORY LAPAROTOMY;  Surgeon: Ralene Ok, MD;  Location: [place];  Service: General;  Laterality: N/A;   LYSIS OF  ADHESION N/A 06/12/2018   Procedure: LYSIS OF ADHESIONS;  Surgeon: [name], MD;  Location: [facility];  Service: General;  Laterality: N/A;   LYSIS OF ADHESION N/A 03/02/2019   Procedure: Lysis Of Adhesion;  Surgeon: Ralene Ok, MD;  Location: [place];  Service: General;  Laterality: N/A;   PORTACATH PLACEMENT Right 03/07/2018   Procedure: INSERTION PORT-A-CATH RIGHT SUBCLAVIAN;  Surgeon: [name], MD;  Location: [place];  Service: General;  Laterality: Right;   PROCTOSCOPY N/A 06/12/2018   Procedure: RIGID PROCTOSCOPY;  Surgeon: [name], MD;  Location: [facility];  Service: General;  Laterality: N/A;   thumb surgery   2018   cyst removal    MEDICATIONS:  apixaban (ELIQUIS) 5 MG TABS tablet   b complex vitamins tablet   capecitabine (XELODA) 500 MG tablet   diltiazem (CARDIZEM CD) 360 MG 24 hr capsule   levothyroxine (SYNTHROID) 75 MCG tablet   lidocaine (LIDODERM) 5 %   lidocaine-prilocaine (EMLA) cream   Multiple Vitamins-Iron (MULTIVITAMIN/IRON PO)   prochlorperazine (COMPAZINE) 10 MG tablet   tamsulosin (FLOMAX) 0.4 MG CAPS capsule  urea (CARMOL) 10 % cream   No current facility-administered medications for this encounter.    Myra Gianotti, PA-C Surgical Short Stay/Anesthesiology [facility] Phone [phone] [facility] Phone [phone] 03/05/2021 10:53 AM

## 2021-03-05 NOTE — Anesthesia Preprocedure Evaluation (Addendum)
Anesthesia Evaluation  Patient identified by MRN, date of birth, ID band Patient awake    Reviewed: Allergy & Precautions, NPO status , Patient's Chart, lab work & pertinent test results  History of Anesthesia Complications Negative for: history of anesthetic complications  Airway Mallampati: III  TM Distance: >3 FB Neck ROM: Full    Dental  (+) Chipped,    Pulmonary neg pulmonary ROS,    Pulmonary exam normal        Cardiovascular hypertension, Pt. on medications Normal cardiovascular exam+ dysrhythmias (on Eliquis) Atrial Fibrillation      Neuro/Psych Anxiety Depression negative neurological ROS     GI/Hepatic Neg liver ROS,   Endo/Other  Hypothyroidism   Renal/GU Renal InsufficiencyRenal disease  negative genitourinary   Musculoskeletal negative musculoskeletal ROS (+)   Abdominal   Peds  Hematology Plt 144k   Anesthesia Other Findings Day of surgery medications reviewed with patient.  Reproductive/Obstetrics negative OB ROS                           Anesthesia Physical Anesthesia Plan  ASA: 2  Anesthesia Plan: MAC   Post-op Pain Management:    Induction:   PONV Risk Score and Plan: 1 and Treatment may vary due to age or medical condition, Propofol infusion and Ondansetron  Airway Management Planned: Natural Airway and Nasal Cannula  Additional Equipment: None  Intra-op Plan:   Post-operative Plan:   Informed Consent: I have reviewed the patients History and Physical, chart, labs and discussed the procedure including the risks, benefits and alternatives for the proposed anesthesia with the patient or authorized representative who has indicated his/her understanding and acceptance.       Plan Discussed with: CRNA  Anesthesia Plan Comments: (PAT note written 03/05/2021 by Myra Gianotti, PA-C. )      Anesthesia Quick Evaluation

## 2021-03-08 NOTE — H&P (Signed)
Chief Complaint: Hernia       History of Present Illness: Dave Vaughn is a 75 y.o. male who is seen today for left-sided abdominal wall pain. Patient is a 75 year old male with a history of colon cancer, history of colostomy, comes in secondary to left abdominal wall pain.  Patient had previous exploratory laparotomy, T AR hernia repair in 2020.  Patient's been doing well from that.  He states his most recent pain began approximately 1 to 2 months ago.  He states he has pain with standing.  He has pain with no activity.  He states that the pain has been more more constant in the last several months.  He states that the pain is burning in sensation.  He has noticed a small bulge in the area.   Patient on CT scan.  CT scan was not significant for any recurrence of the hernia.  His previous hernia repair appears to be intact.   He has a area of scar tissue over the area of concern.  This is just above the previous scar for his previous colostomy.   .       Review of Systems: A complete review of systems was obtained from the patient.  I have reviewed this information and discussed as appropriate with the patient.  See HPI as well for other ROS.   Review of Systems  Constitutional: Negative for fever.  HENT: Negative for congestion.   Eyes: Negative for blurred vision.  Respiratory: Negative for cough, shortness of breath and wheezing.   Cardiovascular: Negative for chest pain and palpitations.  Gastrointestinal: Positive for abdominal pain. Negative for heartburn.  Genitourinary: Negative for dysuria.  Musculoskeletal: Negative for myalgias.  Skin: Negative for rash.  Neurological: Negative for dizziness and headaches.  Psychiatric/Behavioral: Negative for depression and suicidal ideas.  All other systems reviewed and are negative.       Medical History: Past Medical History      Past Medical History:  Diagnosis Date   History of cancer          There is no problem  list on file for this patient.     Past Surgical History       Past Surgical History:  Procedure Laterality Date   COLON SURGERY       HERNIA REPAIR            Allergies  No Known Allergies           Current Outpatient Medications on File Prior to Visit  Medication Sig Dispense Refill   b complex vitamins tablet Take 1 tablet by mouth once daily       capecitabine (XELODA) 500 MG tablet         diltiazem (TIAZAC) 360 MG ER capsule Take by mouth       ELIQUIS 5 mg tablet Take 5 mg by mouth 2 (two) times daily       levothyroxine (SYNTHROID) 75 MCG tablet         multivitamin tablet Take 1 tablet by mouth once daily       tamsulosin (FLOMAX) 0.4 mg capsule Take 0.4 mg by mouth 2 (two) times daily        No current facility-administered medications on file prior to visit.      Family History       Family History  Problem Relation Age of Onset   Breast cancer Mother     Hyperlipidemia (Elevated cholesterol) Father  Social History       Tobacco Use  Smoking Status Never Smoker  Smokeless Tobacco Never Used      Social History  Social History        Socioeconomic History   Marital status: Married  Tobacco Use   Smoking status: Never Smoker   Smokeless tobacco: Never Used  Scientific laboratory technician Use: Never used  Substance and Sexual Activity   Alcohol use: Defer   Drug use: Defer   Sexual activity: Defer        Objective:         Vitals:    02/18/21 1021  BP: (!) 142/80  Pulse: 65  Temp: 36.6 C (97.8 F)  SpO2: 99%  Weight: 99.5 kg (219 lb 6.4 oz)  Height: 177.8 cm (5\' 10" )  PainSc: 0-No pain    Body mass index is 31.48 kg/m.   Physical Exam Constitutional:      General: He is not in acute distress.    Appearance: Normal appearance.  HENT:     Head: Normocephalic.     Nose: No rhinorrhea.     Mouth/Throat:     Mouth: Mucous membranes are moist.     Pharynx: Oropharynx is clear.  Eyes:     General: No scleral icterus.     Pupils: Pupils are equal, round, and reactive to light.  Cardiovascular:     Rate and Rhythm: Normal rate.     Pulses: Normal pulses.  Pulmonary:     Effort: Pulmonary effort is normal. No respiratory distress.     Breath sounds: No stridor. No wheezing.  Abdominal:     General: Abdomen is flat. There is no distension.     Tenderness: There is no abdominal tenderness. There is no guarding or rebound.    Musculoskeletal:        General: Normal range of motion.     Cervical back: Normal range of motion and neck supple.  Skin:    General: Skin is warm and dry.     Capillary Refill: Capillary refill takes less than 2 seconds.     Coloration: Skin is not jaundiced.  Neurological:     General: No focal deficit present.     Mental Status: He is alert and oriented to person, place, and time. Mental status is at baseline.  Psychiatric:        Mood and Affect: Mood normal.        Thought Content: Thought content normal.        Judgment: Judgment normal.          Assessment and Plan:  Diagnoses and all orders for this visit:   Abdominal wall pain, unspecified     Patient is a 75 year old male with a history of a colostomy secondary to cancer, hernia repair in 2020.   It appears that the patient either has scar tissue or the transapical sutures causing some pain and discomfort. 1.  We will proceed to the operating for local exploration of the stitch as well as any excision of scar tissue.   2.  Discussed with him the risk and benefits of the procedure to include but not limited to: Infection, bleeding, damage surrounding structures, possible need for further surgery.  Patient voiced understanding wishes to proceed.   No follow-ups on file.   Ralene Ok, MD

## 2021-03-09 ENCOUNTER — Other Ambulatory Visit: Payer: Self-pay

## 2021-03-09 ENCOUNTER — Encounter (HOSPITAL_COMMUNITY): Payer: Self-pay | Admitting: General Surgery

## 2021-03-09 ENCOUNTER — Ambulatory Visit (HOSPITAL_COMMUNITY): Payer: Commercial Managed Care - PPO | Admitting: Vascular Surgery

## 2021-03-09 ENCOUNTER — Encounter (HOSPITAL_COMMUNITY)
Admission: RE | Disposition: A | Discharge status: Home / Self Care | Payer: Self-pay | Source: Home / Self Care | Attending: General Surgery

## 2021-03-09 ENCOUNTER — Ambulatory Visit (HOSPITAL_COMMUNITY): Payer: Commercial Managed Care - PPO | Admitting: Certified Registered Nurse Anesthetist

## 2021-03-09 ENCOUNTER — Encounter: Payer: Self-pay | Admitting: Hematology

## 2021-03-09 ENCOUNTER — Ambulatory Visit (HOSPITAL_COMMUNITY)
Admission: RE | Admit: 2021-03-09 | Discharge: 2021-03-09 | Disposition: A | Discharge status: Home / Self Care | Payer: Commercial Managed Care - PPO | Attending: General Surgery | Admitting: General Surgery

## 2021-03-09 ENCOUNTER — Encounter: Payer: Self-pay | Admitting: Oncology

## 2021-03-09 DIAGNOSIS — Z933 Colostomy status: Secondary | ICD-10-CM | POA: Diagnosis not present

## 2021-03-09 DIAGNOSIS — Z8719 Personal history of other diseases of the digestive system: Secondary | ICD-10-CM | POA: Insufficient documentation

## 2021-03-09 DIAGNOSIS — R109 Unspecified abdominal pain: Secondary | ICD-10-CM | POA: Diagnosis present

## 2021-03-09 DIAGNOSIS — Z79890 Hormone replacement therapy (postmenopausal): Secondary | ICD-10-CM | POA: Insufficient documentation

## 2021-03-09 DIAGNOSIS — Z85038 Personal history of other malignant neoplasm of large intestine: Secondary | ICD-10-CM | POA: Insufficient documentation

## 2021-03-09 DIAGNOSIS — M795 Residual foreign body in soft tissue: Secondary | ICD-10-CM | POA: Diagnosis not present

## 2021-03-09 DIAGNOSIS — L905 Scar conditions and fibrosis of skin: Secondary | ICD-10-CM | POA: Insufficient documentation

## 2021-03-09 DIAGNOSIS — Z7901 Long term (current) use of anticoagulants: Secondary | ICD-10-CM | POA: Diagnosis not present

## 2021-03-09 DIAGNOSIS — Z803 Family history of malignant neoplasm of breast: Secondary | ICD-10-CM | POA: Diagnosis not present

## 2021-03-09 HISTORY — PX: LAPAROTOMY: SHX154

## 2021-03-09 HISTORY — PX: FOREIGN BODY REMOVAL ABDOMINAL: SHX5319

## 2021-03-09 SURGERY — LAPAROTOMY, EXPLORATORY
Anesthesia: Monitor Anesthesia Care | Site: Abdomen

## 2021-03-09 MED ORDER — PROPOFOL 10 MG/ML IV BOLUS
INTRAVENOUS | Status: DC | PRN
  Administered 2021-03-09: 50 mg via INTRAVENOUS

## 2021-03-09 MED ORDER — MIDAZOLAM HCL 2 MG/2ML IJ SOLN
INTRAMUSCULAR | Status: AC
  Filled 2021-03-09: qty 2

## 2021-03-09 MED ORDER — LIDOCAINE-EPINEPHRINE 1 %-1:100000 IJ SOLN
INTRAMUSCULAR | Status: AC
  Filled 2021-03-09: qty 1

## 2021-03-09 MED ORDER — TRAMADOL HCL 50 MG PO TABS: 50.0000 mg | ORAL_TABLET | Freq: Four times a day (QID) | ORAL | 0 refills | Status: DC | PRN

## 2021-03-09 MED ORDER — ORAL CARE MOUTH RINSE: 15.0000 mL | Freq: Once | OROMUCOSAL | Status: AC

## 2021-03-09 MED ORDER — ONDANSETRON HCL 4 MG/2ML IJ SOLN
INTRAMUSCULAR | Status: DC | PRN
  Administered 2021-03-09: 4 mg via INTRAVENOUS

## 2021-03-09 MED ORDER — CHLORHEXIDINE GLUCONATE 0.12 % MT SOLN
15.0000 mL | Freq: Once | OROMUCOSAL | Status: AC
  Administered 2021-03-09: 15 mL via OROMUCOSAL
  Filled 2021-03-09: qty 15

## 2021-03-09 MED ORDER — MIDAZOLAM HCL 5 MG/5ML IJ SOLN
INTRAMUSCULAR | Status: DC | PRN
Start: 2021-03-09 — End: 2021-03-09
  Administered 2021-03-09: 2 mg via INTRAVENOUS

## 2021-03-09 MED ORDER — PROPOFOL 10 MG/ML IV BOLUS
INTRAVENOUS | Status: AC
  Filled 2021-03-09: qty 20

## 2021-03-09 MED ORDER — OXYCODONE HCL 5 MG/5ML PO SOLN
5.0000 mg | Freq: Once | ORAL | Status: DC | PRN
Start: 2021-03-09 — End: 2021-03-09

## 2021-03-09 MED ORDER — FENTANYL CITRATE (PF) 100 MCG/2ML IJ SOLN
25.0000 ug | INTRAMUSCULAR | Status: DC | PRN
  Administered 2021-03-09 (×2): 25 ug via INTRAVENOUS

## 2021-03-09 MED ORDER — FENTANYL CITRATE (PF) 250 MCG/5ML IJ SOLN
INTRAMUSCULAR | Status: DC | PRN
  Administered 2021-03-09 (×2): 25 ug via INTRAVENOUS

## 2021-03-09 MED ORDER — CEFAZOLIN SODIUM-DEXTROSE 2-4 GM/100ML-% IV SOLN
INTRAVENOUS | Status: AC
  Filled 2021-03-09: qty 100

## 2021-03-09 MED ORDER — BUPIVACAINE-EPINEPHRINE (PF) 0.25% -1:200000 IJ SOLN
INTRAMUSCULAR | Status: DC | PRN
  Administered 2021-03-09: 21 mL

## 2021-03-09 MED ORDER — FENTANYL CITRATE (PF) 100 MCG/2ML IJ SOLN
INTRAMUSCULAR | Status: AC
  Filled 2021-03-09: qty 2

## 2021-03-09 MED ORDER — PROPOFOL 500 MG/50ML IV EMUL
INTRAVENOUS | Status: DC | PRN
  Administered 2021-03-09: 100 ug/kg/min via INTRAVENOUS

## 2021-03-09 MED ORDER — 0.9 % SODIUM CHLORIDE (POUR BTL) OPTIME
TOPICAL | Status: DC | PRN
  Administered 2021-03-09: 1000 mL

## 2021-03-09 MED ORDER — FENTANYL CITRATE (PF) 250 MCG/5ML IJ SOLN
INTRAMUSCULAR | Status: AC
  Filled 2021-03-09: qty 5

## 2021-03-09 MED ORDER — OXYCODONE HCL 5 MG PO TABS: 5.0000 mg | ORAL_TABLET | Freq: Once | ORAL | Status: DC | PRN

## 2021-03-09 MED ORDER — LACTATED RINGERS IV SOLN: INTRAVENOUS | Status: DC

## 2021-03-09 MED ORDER — BUPIVACAINE-EPINEPHRINE (PF) 0.25% -1:200000 IJ SOLN
INTRAMUSCULAR | Status: AC
  Filled 2021-03-09: qty 30

## 2021-03-09 MED ORDER — CEFAZOLIN SODIUM-DEXTROSE 1-4 GM/50ML-% IV SOLN
INTRAVENOUS | Status: DC | PRN
  Administered 2021-03-09: 2 g via INTRAVENOUS

## 2021-03-09 SURGICAL SUPPLY — 48 items
ADH SKN CLS APL DERMABOND .7 (GAUZE/BANDAGES/DRESSINGS) ×1
APL PRP STRL LF DISP 70% ISPRP (MISCELLANEOUS) ×1
BLADE CLIPPER SURG (BLADE) IMPLANT
CANISTER SUCT 3000ML PPV (MISCELLANEOUS) ×2 IMPLANT
CHLORAPREP W/TINT 26 (MISCELLANEOUS) ×2 IMPLANT
COVER SURGICAL LIGHT HANDLE (MISCELLANEOUS) ×2 IMPLANT
DERMABOND ADVANCED (GAUZE/BANDAGES/DRESSINGS) ×1
DERMABOND ADVANCED .7 DNX12 (GAUZE/BANDAGES/DRESSINGS) IMPLANT
DRAPE LAPAROSCOPIC ABDOMINAL (DRAPES) ×2 IMPLANT
DRAPE WARM FLUID 44X44 (DRAPES) ×1 IMPLANT
DRSG OPSITE POSTOP 4X10 (GAUZE/BANDAGES/DRESSINGS) IMPLANT
DRSG OPSITE POSTOP 4X8 (GAUZE/BANDAGES/DRESSINGS) IMPLANT
ELECT BLADE 6.5 EXT (BLADE) IMPLANT
ELECT CAUTERY BLADE 6.4 (BLADE) ×2 IMPLANT
ELECT REM PT RETURN 9FT ADLT (ELECTROSURGICAL) ×2
ELECTRODE REM PT RTRN 9FT ADLT (ELECTROSURGICAL) ×1 IMPLANT
GLOVE SRG 8 PF TXTR STRL LF DI (GLOVE) ×1 IMPLANT
GLOVE SURG ENC MOIS LTX SZ7.5 (GLOVE) ×2 IMPLANT
GLOVE SURG UNDER POLY LF SZ8 (GLOVE) ×2
GOWN STRL REUS W/ TWL LRG LVL3 (GOWN DISPOSABLE) ×1 IMPLANT
GOWN STRL REUS W/ TWL XL LVL3 (GOWN DISPOSABLE) ×1 IMPLANT
GOWN STRL REUS W/TWL LRG LVL3 (GOWN DISPOSABLE) ×2
GOWN STRL REUS W/TWL XL LVL3 (GOWN DISPOSABLE) ×2
HANDLE SUCTION POOLE (INSTRUMENTS) ×1 IMPLANT
KIT BASIN OR (CUSTOM PROCEDURE TRAY) ×2 IMPLANT
KIT TURNOVER KIT B (KITS) ×2 IMPLANT
LIGASURE IMPACT 36 18CM CVD LR (INSTRUMENTS) IMPLANT
NDL HYPO 25GX1X1/2 BEV (NEEDLE) IMPLANT
NEEDLE HYPO 25GX1X1/2 BEV (NEEDLE) ×2 IMPLANT
NS IRRIG 1000ML POUR BTL (IV SOLUTION) ×4 IMPLANT
PACK GENERAL/GYN (CUSTOM PROCEDURE TRAY) ×2 IMPLANT
PAD ARMBOARD 7.5X6 YLW CONV (MISCELLANEOUS) ×2 IMPLANT
PENCIL SMOKE EVACUATOR (MISCELLANEOUS) ×1 IMPLANT
SPECIMEN JAR LARGE (MISCELLANEOUS) IMPLANT
SPONGE T-LAP 18X18 ~~LOC~~+RFID (SPONGE) IMPLANT
STAPLER VISISTAT 35W (STAPLE) ×1 IMPLANT
SUCTION POOLE HANDLE (INSTRUMENTS)
SUT MNCRL AB 4-0 PS2 18 (SUTURE) ×1 IMPLANT
SUT PDS AB 1 TP1 54 (SUTURE) ×2 IMPLANT
SUT SILK 2 0 SH CR/8 (SUTURE) ×1 IMPLANT
SUT SILK 2 0 TIES 10X30 (SUTURE) ×1 IMPLANT
SUT SILK 3 0 SH CR/8 (SUTURE) ×1 IMPLANT
SUT SILK 3 0 TIES 10X30 (SUTURE) ×1 IMPLANT
SUT VIC AB 3-0 SH 8-18 (SUTURE) ×1 IMPLANT
SYR CONTROL 10ML LL (SYRINGE) ×1 IMPLANT
TOWEL GREEN STERILE (TOWEL DISPOSABLE) ×2 IMPLANT
TRAY FOLEY MTR SLVR 16FR STAT (SET/KITS/TRAYS/PACK) ×2 IMPLANT
YANKAUER SUCT BULB TIP NO VENT (SUCTIONS) IMPLANT

## 2021-03-09 NOTE — Transfer of Care (Signed)
Immediate Anesthesia Transfer of Care Note  Patient: Dave Vaughn  Procedure(s) Performed: EXPLORATION OF ABDOMINAL WALL (Abdomen) EXCISION OF FOREIGN BODY (Abdomen)  Patient Location: PACU  Anesthesia Type:MAC  Level of Consciousness: awake, alert  and oriented    Airway & Oxygen Therapy: Patient Spontanous Breathing  Post-op Assessment: Report given to RN, Post -op Vital signs reviewed and stable and Patient moving all extremities  Post vital signs: Reviewed and stable  Last Vitals:  Vitals Value Taken Time  BP 115/59   Temp    Pulse 68 03/09/21 0825  Resp 11 03/09/21 0825  SpO2 99 % 03/09/21 0825  Vitals shown include unvalidated device data.  Last Pain:  Vitals:   03/09/21 0624  TempSrc:   PainSc: 6       Patients Stated Pain Goal: 1 (39/43/20 0379)  Complications: No notable events documented.

## 2021-03-09 NOTE — Op Note (Signed)
03/09/2021  8:18 AM  PATIENT:  Dave Vaughn  75 y.o. male  PRE-OPERATIVE DIAGNOSIS:  ABDOMINAL WALL PAIN  POST-OPERATIVE DIAGNOSIS:  ABDOMINAL WALL PAIN  PROCEDURE:  Procedure(s): EXPLORATION OF ABDOMINAL WALL (N/A) EXCISION OF FOREIGN BODY x2 (sutures) (N/A)  SURGEON:  Surgeon(s) and Role:    * Ralene Ok, MD - Primary  ANESTHESIA:   local and IV sedation  EBL:  minimal   BLOOD ADMINISTERED:none  DRAINS: none   LOCAL MEDICATIONS USED:  BUPIVICAINE   SPECIMEN:  No Specimen  DISPOSITION OF SPECIMEN:  N/A  COUNTS:  YES  TOURNIQUET:  * No tourniquets in log *  DICTATION: .Dragon Dictation Indication procedure: Patient is a 75 year old male who previously had undergone T AR hernia repair with mesh.  Patient had transfascial sutures.  Patient developed pain 2 to 3 years after surgery to the left lower quadrant area.  Patient did not have a recurrence on CT scan.  Patient was taken back to the operating for exploration and removal of sutures, scar tissue.  Findings: Patient had 2 Prolene sutures that were removed from the left lower quadrant for the previous ostomy site area.  These appear to be where the anchoring sutures were to the mesh.  There was some dense scar tissue over the previous ostomy site as expected.  Part this was excised laterally.  Details procedure: After the patient was consented he was taken back to the OR and placed in supine position with bilateral SCDs in place.  He underwent moderate sedation.  Patient then prepped and draped standard fashion.  Timeout was called on fax verified.  A linear incision was made over the previous ostomy site laterally.  At this time cautery was used to maintain hemostasis and dissection was taken down to the fascia.  There was a fair amount of calcified scar tissue over the ostomy site area.  This was excised down to the fascia.  At this time superior and inferior to the incision site subcutaneous tissue to Prolene  sutures were found.  These were excised individually.  These appear to be transfascial sutures of the mesh.  At this time the subcutaneous tissue was reapproximated using 3-0 Vicryl's in a subcutaneous fashion.  The skin was reapproximated 4 Monocryl subcuticular fashion.  Skin was dressed Dermabond.  Patient taught the procedure well was taken to the recovery in stable condition.   PLAN OF CARE: Discharge to home after PACU  PATIENT DISPOSITION:  PACU - hemodynamically stable.   Delay start of Pharmacological VTE agent (>24hrs) due to surgical blood loss or risk of bleeding: not applicable

## 2021-03-09 NOTE — Interval H&P Note (Signed)
History and Physical Interval Note:  03/09/2021 7:19 AM  Dave Vaughn  has presented today for surgery, with the diagnosis of ABDOMINAL WALL PAIN.  The various methods of treatment have been discussed with the patient and family. After consideration of risks, benefits and other options for treatment, the patient has consented to  Procedure(s): EXPLORATION OF ABDOMINAL WALL (N/A) EXCISION OF FOREIGN BODY (N/A) as a surgical intervention.  The patient's history has been reviewed, patient examined, no change in status, stable for surgery.  I have reviewed the patient's chart and labs.  Questions were answered to the patient's satisfaction.     Ralene Ok

## 2021-03-09 NOTE — Anesthesia Procedure Notes (Signed)
Procedure Name: MAC Date/Time: 03/09/2021 7:39 AM Performed by: Kerry Fort, CRNA Pre-anesthesia Checklist: Patient identified, Emergency Drugs available, Suction available and Patient being monitored Patient Re-evaluated:Patient Re-evaluated prior to induction Oxygen Delivery Method: Simple face mask Dental Injury: Teeth and Oropharynx as per pre-operative assessment

## 2021-03-09 NOTE — Anesthesia Postprocedure Evaluation (Signed)
Anesthesia Post Note  Patient: Dave Vaughn  Procedure(s) Performed: EXPLORATION OF ABDOMINAL WALL (Abdomen) EXCISION OF FOREIGN BODY (Abdomen)     Patient location during evaluation: PACU Anesthesia Type: MAC Level of consciousness: awake and alert and oriented Pain management: pain level controlled Vital Signs Assessment: post-procedure vital signs reviewed and stable Respiratory status: spontaneous breathing, nonlabored ventilation and respiratory function stable Cardiovascular status: blood pressure returned to baseline Postop Assessment: no apparent nausea or vomiting Anesthetic complications: no   No notable events documented.  Last Vitals:  Vitals:   03/09/21 0842 03/09/21 0857  BP: 110/66 121/72  Pulse: 64 (!) 56  Resp: 15 20  Temp:  (!) 36.1 C  SpO2: 95% 99%    Last Pain:  Vitals:   03/09/21 0857  TempSrc:   PainSc: [place]

## 2021-03-10 ENCOUNTER — Encounter (HOSPITAL_COMMUNITY): Payer: Self-pay | Admitting: General Surgery

## 2021-03-14 ENCOUNTER — Other Ambulatory Visit: Payer: Self-pay | Admitting: Hematology

## 2021-03-14 DIAGNOSIS — C186 Malignant neoplasm of descending colon: Secondary | ICD-10-CM

## 2021-03-16 ENCOUNTER — Inpatient Hospital Stay: Payer: Commercial Managed Care - PPO

## 2021-03-16 ENCOUNTER — Other Ambulatory Visit: Payer: Self-pay

## 2021-03-16 ENCOUNTER — Encounter: Payer: Self-pay | Admitting: Hematology

## 2021-03-16 ENCOUNTER — Inpatient Hospital Stay: Payer: Commercial Managed Care - PPO | Attending: Nurse Practitioner | Admitting: Hematology

## 2021-03-16 ENCOUNTER — Telehealth: Payer: Self-pay | Admitting: Hematology

## 2021-03-16 VITALS — BP 144/95 | HR 66 | Temp 98.2°F | Resp 17 | Ht 70.0 in | Wt 220.4 lb

## 2021-03-16 DIAGNOSIS — Z79899 Other long term (current) drug therapy: Secondary | ICD-10-CM | POA: Diagnosis not present

## 2021-03-16 DIAGNOSIS — K8020 Calculus of gallbladder without cholecystitis without obstruction: Secondary | ICD-10-CM | POA: Diagnosis not present

## 2021-03-16 DIAGNOSIS — C186 Malignant neoplasm of descending colon: Secondary | ICD-10-CM | POA: Insufficient documentation

## 2021-03-16 DIAGNOSIS — E039 Hypothyroidism, unspecified: Secondary | ICD-10-CM | POA: Diagnosis not present

## 2021-03-16 DIAGNOSIS — Z95828 Presence of other vascular implants and grafts: Secondary | ICD-10-CM

## 2021-03-16 DIAGNOSIS — C787 Secondary malignant neoplasm of liver and intrahepatic bile duct: Secondary | ICD-10-CM | POA: Diagnosis not present

## 2021-03-16 DIAGNOSIS — I700 Atherosclerosis of aorta: Secondary | ICD-10-CM | POA: Diagnosis not present

## 2021-03-16 DIAGNOSIS — C786 Secondary malignant neoplasm of retroperitoneum and peritoneum: Secondary | ICD-10-CM | POA: Insufficient documentation

## 2021-03-16 DIAGNOSIS — I4891 Unspecified atrial fibrillation: Secondary | ICD-10-CM | POA: Diagnosis not present

## 2021-03-16 DIAGNOSIS — N400 Enlarged prostate without lower urinary tract symptoms: Secondary | ICD-10-CM | POA: Diagnosis not present

## 2021-03-16 LAB — CBC WITH DIFFERENTIAL (CANCER CENTER ONLY)
Abs Immature Granulocytes: 0.02 10*3/uL (ref 0.00–0.07)
Basophils Absolute: 0 10*3/uL (ref 0.0–0.1)
Basophils Relative: 1 %
Eosinophils Absolute: 0.1 10*3/uL (ref 0.0–0.5)
Eosinophils Relative: 2 %
HCT: 44 % (ref 39.0–52.0)
Hemoglobin: 15.4 g/dL (ref 13.0–17.0)
Immature Granulocytes: 0 %
Lymphocytes Relative: 33 %
Lymphs Abs: 2 10*3/uL (ref 0.7–4.0)
MCH: 34.7 pg — ABNORMAL HIGH (ref 26.0–34.0)
MCHC: 35 g/dL (ref 30.0–36.0)
MCV: 99.1 fL (ref 80.0–100.0)
Monocytes Absolute: 0.6 10*3/uL (ref 0.1–1.0)
Monocytes Relative: 10 %
Neutro Abs: 3.2 10*3/uL (ref 1.7–7.7)
Neutrophils Relative %: 54 %
Platelet Count: 195 10*3/uL (ref 150–400)
RBC: 4.44 MIL/uL (ref 4.22–5.81)
RDW: 13.5 % (ref 11.5–15.5)
WBC Count: 5.9 10*3/uL (ref 4.0–10.5)
nRBC: 0 % (ref 0.0–0.2)

## 2021-03-16 LAB — CMP (CANCER CENTER ONLY)
ALT: 16 U/L (ref 0–44)
AST: 21 U/L (ref 15–41)
Albumin: 4.1 g/dL (ref 3.5–5.0)
Alkaline Phosphatase: 105 U/L (ref 38–126)
Anion gap: 10 (ref 5–15)
BUN: 22 mg/dL (ref 8–23)
CO2: 27 mmol/L (ref 22–32)
Calcium: 9.6 mg/dL (ref 8.9–10.3)
Chloride: 103 mmol/L (ref 98–111)
Creatinine: 1.18 mg/dL (ref 0.61–1.24)
GFR, Estimated: >60 mL/min (ref >60)
Glucose, Bld: 99 mg/dL (ref 70–99)
Potassium: 4.1 mmol/L (ref 3.5–5.1)
Sodium: 140 mmol/L (ref 135–145)
Total Bilirubin: 1.3 mg/dL — ABNORMAL HIGH (ref 0.3–1.2)
Total Protein: 7.2 g/dL (ref 6.5–8.1)

## 2021-03-16 LAB — TOTAL PROTEIN, URINE DIPSTICK: Protein, ur: 300 mg/dL — AB

## 2021-03-16 MED ORDER — SODIUM CHLORIDE 0.9% FLUSH
10.0000 mL | INTRAVENOUS | Status: DC | PRN
  Administered 2021-03-16: 10 mL

## 2021-03-16 NOTE — Progress Notes (Signed)
Dave Vaughn   [phone] [phone]   Clinic Follow up Note   Patient Care Team: [name], MD as PCP - General (Family Medicine) [name], MD as Consulting Physician (Cardiology) [name], MD as Consulting Physician (Gastroenterology) [name], MD as Consulting Physician (General Surgery) [name], MD as Consulting Physician (General Surgery) [name], MD as Consulting Physician (Urology) [name], MD as Consulting Physician (Medical Oncology)  Date of Service:  03/16/2021  CHIEF COMPLAINT: f/u of metastatic colon cancer  CURRENT THERAPY:  First line FOLFIRI q2weeks starting 01/28/20 for 8 cycles. Bevacizumab added with C2. Changed to maintenance Xeloda 2000 mg twice daily for 2 weeks on/1 week off and bevacizumab 06/02/20. Continue Beva every 3 weeks. C3 dose reduce to 1535m BID due to skin toxicity.   ASSESSMENT & PLAN:  Dave CARLEYis a 75y.o. male with   1. Cancer of left colon, adenocarcinoma, stage IIIB (pT3N1cM0), MSI-stable, KRAS+ -Diagnosed in 01/2018. Treated with surgery and adjuvant FOLFOX. Due to side effects Oxaliplatin was stopped after 3 cycles and chemo was stopped after 6 cycles. He declined completing 6 months of chemo treatment.  -unfortunately, he developed local recurrence in left abdominal wall and peritoneal metastasis, 2 hypermetabolic liver lets in 78/4132 Abdominal wall biopsy conformed metastatic colorectal adenocarcinoma in 01/2020  -Began first line dose-reduced FOLFIRI on 01/28/2020 -[facility] One showed Kras (+) positive, he is not a candidate for EGFR inhibitor.  Bevacizumab was added with cycle 2  -S/p 8 cycles of FOLFIRI and bevacizumab from cycle 2; tolerated well with fatigue, nausea, diarrhea. CEA normalized after cycle 4, then switched to maintenance xeloda and bevacizumab  -Cycle 2 Xeloda reduced due to skin toxicity (skin rash to his forearms and  sensitivity to his feet) -CT CAP 01/29/21 showed overall stable disease, continue Xeloda 2 weeks on/1 week off and Beva every 3 weeks when urine protein less than 300. Beva has been held multiple times due to proteinuria  -he recently had abdominal surgery to repair mesh and sutures. Between this and his elevated urine protein, we will continue to hold Avastin. He will also continue to hold Xeloda until he heals. I will see him again in 2 weeks to restart Xeloda. -I reviewed that his CEA has been slowly going up which is concerning for cancer progression and plan to repeat scan in late Nov    2. Hypothyroidism -Previously on synthroid, TSH normal 2 years ago. He has not been taking synthroid for a while -TSH in 02/2020 up to 67, he went back on Synthroid since 02/16/20   3.  BPH  -Followed by urologist Dr. WLovena Neighbours-Pelvic MRI on 7/1 shows marked prostatomegaly with signs of BPH extending into the bladder base.  -Denies new or worsening urinary symptoms   4. Atrial Fibrillation -continue Eliquis. Rate controlled.  He can stop Eliquis periodically for bleeding hemorrhoids -Continue follow-up with cardiology -Plan to hold Eliquis for 5 days prior to upcoming abdominal surgery   5. CKD stage III -he has developed mild increased Cr since he started chemo, EGFR around 40-50's  -I encouraged him to control his blood pressure, cholesterol and BG. I also encouraged him to drink plenty of water.  -Will avoid NSAIDs and CT IV contrast if GFR low -Improved today   PLAN: -cancel Beva today -hold Xeloda until next visit due to his recent surgery  -lab and f/u in 2 weeks, plan to restart Xeloda on next visit  No problem-specific Assessment & Plan notes found for this encounter.   SUMMARY OF ONCOLOGIC HISTORY: Oncology History Overview Note  Cancer Staging Cancer of left colon [facility]) Staging form: Colon and Rectum, AJCC 8th Edition - Pathologic stage from 01/11/2018: Stage IIIB (pT3, pN1c, cM0) -  Signed by [name], MD on 01/16/2018     Cancer of left colon ([place])  01/10/2018 Imaging   CT AP W Contrast 01/10/18  IMPRESSION: Irregular soft tissue density causing stricture of the mid descending colon likely the site of obstruction for the dilated small bowel. 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 No evidence of perforation.   Equivocal findings involving the appendix measuring 1.2 cm at the appendiceal tip with mucosal enhancement. No adjacent free fluid or inflammatory change. Findings are nonspecific, but can be seen with early acute appendicitis.   Mild prostatic enlargement. Increased density over the posterior bladder base likely due to the large prostatic impression although cannot completely exclude a bladder mass. Urology protocol CT or ultrasound may be helpful for better evaluation.   Mild cholelithiasis.   Stable 1.5 cm cystic structure over the lower pole right kidney likely slightly hyperdense cyst.   Diverticulosis of the colon.   Aortic Atherosclerosis (ICD10-I70.0).   01/11/2018 Cancer Staging   Staging form: Colon and Rectum, AJCC 8th Edition - Pathologic stage from 01/11/2018: Stage IIIB (pT3, pN1c, cM0) - Signed by [name], MD on 01/16/2018   01/11/2018 Surgery   LEFT COLON RESECTION, TAKEDOWN SPLENIC FLEXURE, COLOSTOMY by Dr. [name]    01/11/2018 Procedure   Colonoscopy 01/11/18 by Dr. [name]  - Malignant completely obstructing tumor in the mid descending colon. Tattooed. - Diverticulosis in the sigmoid colon. - Non-bleeding internal hemorrhoids. - No specimens collected.   01/11/2018 Pathology Results   Diagnosis 01/11/18  1. Colon, segmental resection for tumor, descending colon - INVASIVE COLORECTAL ADENOCARCINOMA, 4 CM. - TUMOR EXTENDS INTO PERICOLONIC CONNECTIVE TISSUE. - TUMOR FOCALLY INVOLVES RADIAL MARGIN. - ONE MESENTERIC TUMOR DEPOSIT. - THIRTEEN BENIGN LYMPH NODES (0/13). 2. Colon, segmental resection, splenic flexure - BENIGN COLON. - NO  EVIDENCE OF MALIGNANCY .   01/11/2018 Tumor Marker   Baseline CEA at 3.4   01/16/2018 Initial Diagnosis   Cancer of left colon ([place])   01/23/2018 Imaging   CT CHEST WO CONTRAST IMPRESSION: 1. No evidence for metastatic disease within the chest. 2. Small left pleural effusion with underlying opacities which may represent atelectasis. Right basilar atelectasis. 3. Few foci of gas within the upper abdomen in the omentum with surrounding fat stranding, likely postsurgical 4. Aortic Atherosclerosis (ICD10-I70.0).   03/08/2018 - 05/15/2018 Chemotherapy   adjuvant FOLOFX. Due to side effects of neuropahty Oxaliplatin was stopped after 3 cycles and chemo was stopped after 6 cycles. He declined completing 6 months of chemo treatment.     03/19/2018 Imaging   03/19/2018 CT AP IMPRESSION: 1. Interval partial left hemicolectomy and descending colostomy. 2. Heterogeneous soft tissue density along the left anterior renal fascia is most likely postoperative (favor fat necrosis). No well-defined fluid collection. 3. Mild left lower quadrant edema, new since 01/10/2018. This could be postoperative. Superimposed sigmoid diverticulitis and/or cystitis cannot be excluded. 4. Subtle hyperenhancing nodule within the anterior bladder dome cannot be excluded. Consider nonemergent cystoscopy. When this is performed, recommend attention to the left ureterovesicular junction and distal left ureter to evaluate questionable soft tissue fullness. 5. Cholelithiasis. 6.  Aortic Atherosclerosis (ICD10-I70.0). 7. Prostatomegaly.   11/13/2018 Imaging   CT CAP WO Contrast 11/13/18  IMPRESSION: 1.  Reversal of left lower quadrant colostomy with sigmoid colon anastomosis. No complicating features. No findings for residual or recurrent tumor or metastatic disease involving the chest, abdomen or pelvis without contrast. 2. No acute abdominal/pelvic findings. 3. Gallbladder sludge and gallstones but no findings for  acute cholecystitis. 4. Stable anterior abdominal wall hernia. 5. The right testicle is in the right inguinal canal.   10/03/2019 Imaging   CT CAP WO contrast  IMPRESSION: 1. New rounded density interposed between the prostate gland and anterior upper rectal wall could represent adenopathy or local extension of anterior rectal tumor. 2. Marked prostatomegaly, prostate volume 150 cubic cm. 3. Other imaging findings of potential clinical significance: Aortic Atherosclerosis (ICD10-I70.0). Coronary atherosclerosis. Trace right pleural effusion. Suspected cholelithiasis. Nonobstructive left nephrolithiasis. Multilevel lumbar impingement. Bilateral mildly retracted testicles. Hypodense exophytic lesion of the right kidney, most likely to be a cyst.   11/02/2019 Procedure   colonoscopy on 11/02/2019 by Dr. [name] Plan showed normal digital rectal exam, 3 polyps in the rectum, descending colon and cecum, and a prior sigmoid: Anastomosis characterized by erythema.  He found an extrinsic nonobstructing medium-sized mass in the proximal rectum about 4 cm in length, no internal rectal mass.   Diagnosis Surgical [P], colon, cecum, descending, rectal, polyp (3) - TUBULAR ADENOMA (TWO) - NO HIGH GRADE DYSPLASIA OR CARCINOMA. - COLONIC FRAGMENT WITH BENIGN LYMPHOID AGGREGATE.   12/07/2019 Imaging   MRI pelvis IMPRESSION: 1. Masslike area in the rectum suspicious for rectal neoplasm, likely T4b based on the appearance of soft tissue extending along the anterior peritoneal reflection and into the seminal vesicles. Correlation with recent colonoscopy results may be helpful. Area of anastomosis and other areas of the pelvis are not imaged on today's exam.   12/21/2019 PET scan   IMPRESSION: 1. Unfortunately evidence for peritoneal metastasis. Intensely hypermetabolic nodules along the LEFT pericolic gutter. Favor hypermetabolic mass anterior to the rectum to represent serosal implant along the ventral  surface of the rectum. 2. local recurrence within the LEFT abdominal wall at site prior colostomy. Intense hypermetabolic thickening of the rectus muscle at this site. 3. Two hypermetabolic hepatic metastasis.   01/15/2020 Relapse/Recurrence   FINAL MICROSCOPIC DIAGNOSIS:   A. SOFT TISSUE, LEFT ABDOMINAL WALL, BIOPSY:  - Adenocarcinoma.  - See comment.   COMMENT:   The morphology is consistent with metastatic colorectal adenocarcinoma.    01/28/2020 -  Chemotherapy   First line FOLFIRI q2weeks starting 01/28/20 for 8 cycles.  -----Bevacizumab added with C2.  -----Changed to maintenance Xeloda 2000 mg twice daily for 2 weeks on/1 week off and bevacizumab 06/02/20. Starting with C3, dose reduce to 1544m BID due to skin toxicity.   05/05/2020 Imaging   IMPRESSION: 1. Improved appearance, with reduced size of the hepatic metastatic lesions and reduced size of the peritoneal tumor implants. 2. Other imaging findings of potential clinical significance: Notable prostatomegaly. Multilevel impingement in the lumbar spine. Dependent density in the gallbladder possibly from sludge or gallstones. Degenerative glenohumeral arthropathy bilaterally. 3. Aortic atherosclerosis.   08/15/2020 Imaging   CT CAP  IMPRESSION: Chest Impression:   No evidence of thoracic metastasis   Abdomen / Pelvis Impression:   1. Hepatic metastasis are no longer measurable by CT imaging. 2. Peritoneal nodular metastasis in the LEFT abdomen are decreased in size. 3. No evidence of new peritoneal disease. 4. Thickening and LEFT rectus muscle at site of prior metastasis. No interval change. 5. No evidence of new or progressive colorectal carcinoma.     11/27/2020 Imaging  CT CAP  IMPRESSION: 1. There are multiple small pulmonary nodules in the right upper lobe that are new or enlarged compared to prior examination but measuring 4 mm or smaller, suspicious for pulmonary metastatic disease. 2. Unchanged  peritoneal nodule adjacent to the tip of the spleen measuring 1.3 x 1.2 cm. Slightly peritoneal nodule adjacent to the splenic flexure measuring no greater than 6 mm, previously 8 mm. 3. No significant change in previously hypermetabolic soft tissue mass centered about the left lower quadrant colostomy site. 4. Previously established hypermetabolic hepatic metastatic disease remains inapparent by CT. 5. Status post sigmoid colon resection and reanastomosis. 6. Prostatomegaly with thickening of the decompressed urinary bladder, likely secondary to chronic outlet obstruction. 7. Cholelithiasis.   Aortic Atherosclerosis (ICD10-I70.0).   01/29/2021 Imaging   CT CAP   IMPRESSION: 1. Minimal interval progression of bilateral pulmonary nodules, concerning for metastatic disease. 2. No substantial change in size of the peritoneal nodule at the inferior tip of the spleen and splenic flexure nodule. 3. Nodular soft tissue measured at the ostomy site previously is not evident today. 4. Marked prostatomegaly. 5. Cholelithiasis. 6. Aortic Atherosclerosis (ICD10-I70.0).      INTERVAL HISTORY:  Dave Vaughn is here for a follow up of metastatic colon cancer. He was last seen by me on 02/02/21 and by NP Lacie in the interim. He presents to the clinic alone. He notes he underwent surgery a week ago today to fix the mesh and staples that were out of place. He reports his last Xeloda dose was 2 weeks ago.   All other systems were reviewed with the patient and are negative.  MEDICAL HISTORY:  Past Medical History:  Diagnosis Date   Adenocarcinoma, colon ([place]) dx'd 01/2018   Anemia    taking iron supplements   Anxiety    Atrial fibrillation with RVR ([facility])    Colonic obstruction ([place]) 01/10/2018   Depression    Diverticulitis    Dysrhythmia    afib   History of kidney stones    Hypertension     SURGICAL HISTORY: Past Surgical History:  Procedure Laterality Date   ANKLE SURGERY Left     when he was in college   COLON RESECTION N/A 01/11/2018   Procedure: LEFT COLON RESECTION, TAKEDOWN SPLENIC FLEXURE, COLOSTOMY;  Surgeon: [name], MD;  Location: [facility];  Service: General;  Laterality: N/A;   COLONOSCOPY  01/11/2018   Procedure: COLONOSCOPY;  Surgeon: Jackquline Denmark, MD;  Location: [facility];  Service: Endoscopy;;   COLONOSCOPY  05/10/2018   colonscopy  05/10/2018   FOREIGN BODY REMOVAL ABDOMINAL N/A 03/09/2021   Procedure: EXCISION OF FOREIGN BODY;  Surgeon: Ralene Ok, MD;  Location: [place];  Service: General;  Laterality: N/A;   HERNIA REPAIR     HERNIA REPAIR  03/02/2019   EXPLORATORY LAPAROTOMY (N/A Abdomen)   INCISIONAL HERNIA REPAIR N/A 03/02/2019   Procedure: INCISIONAL HERNIA REPAIR , RECTORECTUS VS TAR HERNIA REPAIR;  Surgeon: Ralene Ok, MD;  Location: [place];  Service: General;  Laterality: N/A;   INSERTION OF MESH N/A 03/02/2019   Procedure: Insertion Of Mesh;  Surgeon: Ralene Ok, MD;  Location: [place];  Service: General;  Laterality: N/A;   LAPAROTOMY N/A 03/02/2019   Procedure: EXPLORATORY LAPAROTOMY;  Surgeon: Ralene Ok, MD;  Location: [place];  Service: General;  Laterality: N/A;   LAPAROTOMY N/A 03/09/2021   Procedure: EXPLORATION OF ABDOMINAL WALL;  Surgeon: Ralene Ok, MD;  Location: [place];  Service: General;  Laterality:  N/A;   LYSIS OF ADHESION N/A 06/12/2018   Procedure: LYSIS OF ADHESIONS;  Surgeon: [name], MD;  Location: [facility];  Service: General;  Laterality: N/A;   LYSIS OF ADHESION N/A 03/02/2019   Procedure: Lysis Of Adhesion;  Surgeon: Ralene Ok, MD;  Location: [place];  Service: General;  Laterality: N/A;   PORTACATH PLACEMENT Right 03/07/2018   Procedure: INSERTION PORT-A-CATH RIGHT SUBCLAVIAN;  Surgeon: [name], MD;  Location: [place];  Service: General;  Laterality: Right;   PROCTOSCOPY N/A 06/12/2018   Procedure: RIGID PROCTOSCOPY;  Surgeon: [name], MD;  Location: [facility];  Service: General;   Laterality: N/A;   thumb surgery   2018   cyst removal    I have reviewed the social history and family history with the patient and they are unchanged from previous note.  ALLERGIES:  has No Known Allergies.  MEDICATIONS:  Current Outpatient Medications  Medication Sig Dispense Refill   apixaban (ELIQUIS) 5 MG TABS tablet Take 1 tablet (5 mg total) by mouth 2 (two) times daily. 60 tablet 0   b complex vitamins tablet Take 1 tablet by mouth daily.     capecitabine (XELODA) 500 MG tablet TAKE 3 TABLETS BY MOUTH  EVERY 12 HOURS WITHIN 30  MINUTES OF A MEAL FOR 14  DAYS ON THEN 7 DAYS OFF 84 tablet 1   diltiazem (CARDIZEM CD) 360 MG 24 hr capsule Take 1 capsule (360 mg total) by mouth daily. 90 capsule 3   levothyroxine (SYNTHROID) 75 MCG tablet TAKE 1 TABLET(75 MCG) BY MOUTH DAILY (Patient taking differently: Take 75 mcg by mouth daily.) 90 tablet 1   lidocaine (LIDODERM) 5 % Place 1 patch onto the skin daily. Remove & Discard patch within 12 hours or as directed by MD (Patient not taking: No sig reported) 30 patch 0   lidocaine-prilocaine (EMLA) cream Apply 1 application topically as needed. (Patient taking differently: Apply 1 application topically as needed (port site).) 30 g 1   Multiple Vitamins-Iron (MULTIVITAMIN/IRON PO) Take 1 tablet by mouth daily.      prochlorperazine (COMPAZINE) 10 MG tablet Take 1 tablet (10 mg total) by mouth every 6 (six) hours as needed for nausea or vomiting. 30 tablet 0   tamsulosin (FLOMAX) 0.4 MG CAPS capsule Take 0.4 mg by mouth 2 (two) times daily.      traMADol (ULTRAM) 50 MG tablet Take 1 tablet (50 mg total) by mouth every 6 (six) hours as needed. 20 tablet 0   urea (CARMOL) 10 % cream Apply topically as needed. (Patient not taking: No sig reported) 142 g 2   No current facility-administered medications for this visit.    PHYSICAL EXAMINATION: ECOG PERFORMANCE STATUS: 1 - Symptomatic but completely ambulatory  Vitals:   03/16/21 1139  BP: (!)  144/95  Pulse: 66  Resp: 17  Temp: 98.2 F (36.8 C)  SpO2: 100%   Wt Readings from Last 3 Encounters:  03/16/21 220 lb 6.4 oz (100 kg)  03/09/21 205 lb (93 kg)  03/04/21 222 lb 4.8 oz (100.8 kg)     GENERAL:alert, no distress and comfortable SKIN: skin color, texture, turgor are normal, no rashes or significant lesions EYES: normal, Conjunctiva are pink and non-injected, sclera clear  ABDOMEN:abdomen soft, non-tender and normal bowel sounds NEURO: alert & oriented x 3 with fluent speech, no focal motor/sensory deficits  LABORATORY DATA:  I have reviewed the data as listed CBC Latest Ref Rng & Units 03/16/2021 02/23/2021 02/02/2021  WBC  4.0 - 10.5 K/uL 5.9 5.6 5.2  Hemoglobin 13.0 - 17.0 g/dL 15.4 14.5 14.3  Hematocrit 39.0 - 52.0 % 44.0 42.4 40.5  Platelets 150 - 400 K/uL 195 144(L) 191     CMP Latest Ref Rng & Units 03/16/2021 02/23/2021 02/02/2021  Glucose 70 - 99 mg/dL 99 104(H) 114(H)  BUN 8 - 23 mg/dL _0 Creatinine 0.61 - 1.24 mg/dL 1.18 1.18 1.25(H)  Sodium 135 - 145 mmol/L 140 141 142  Potassium 3.5 - 5.1 mmol/L 4.1 4.0 4.0  Chloride 98 - 111 mmol/L 103 106 106  CO2 22 - 32 mmol/L _1 Calcium 8.9 - 10.3 mg/dL 9.6 9.2 9.1  Total Protein 6.5 - 8.1 g/dL 7.2 7.0 7.1  Total Bilirubin 0.3 - 1.2 mg/dL 1.3(H) 0.9 1.0  Alkaline Phos 38 - 126 U/L 105 98 105  AST 15 - 41 U/L _2 ALT 0 - 44 U/L _3 RADIOGRAPHIC STUDIES: I have personally reviewed the radiological images as listed and agreed with the findings in the report. No results found.    No orders of the defined types were placed in this encounter.  All questions were answered. The patient knows to call the clinic with any problems, questions or concerns. No barriers to learning was detected. The total time spent in the appointment was 30 minutes.     [name], MD 03/16/2021   I, Wilburn Mylar, am acting as scribe for [name], MD.   I have reviewed the above documentation  for accuracy and completeness, and I agree with the above.

## 2021-03-16 NOTE — Telephone Encounter (Signed)
Left message with follow-up appointment per 10/10 los. 

## 2021-03-17 ENCOUNTER — Telehealth: Payer: Self-pay | Admitting: Hematology

## 2021-03-17 NOTE — Telephone Encounter (Signed)
Cancelled 10/31 appts per sch msg. Called and left msg

## 2021-03-31 ENCOUNTER — Other Ambulatory Visit: Payer: Self-pay | Admitting: Hematology

## 2021-03-31 DIAGNOSIS — C186 Malignant neoplasm of descending colon: Secondary | ICD-10-CM

## 2021-04-01 ENCOUNTER — Inpatient Hospital Stay: Payer: Commercial Managed Care - PPO

## 2021-04-01 ENCOUNTER — Other Ambulatory Visit: Payer: Self-pay

## 2021-04-01 ENCOUNTER — Inpatient Hospital Stay (HOSPITAL_BASED_OUTPATIENT_CLINIC_OR_DEPARTMENT_OTHER): Payer: Commercial Managed Care - PPO | Admitting: Hematology

## 2021-04-01 ENCOUNTER — Encounter: Payer: Self-pay | Admitting: Hematology

## 2021-04-01 VITALS — BP 131/80 | HR 65 | Temp 98.0°F | Resp 18 | Ht 70.0 in | Wt 225.5 lb

## 2021-04-01 DIAGNOSIS — C186 Malignant neoplasm of descending colon: Secondary | ICD-10-CM

## 2021-04-01 DIAGNOSIS — Z95828 Presence of other vascular implants and grafts: Secondary | ICD-10-CM

## 2021-04-01 LAB — CBC WITH DIFFERENTIAL (CANCER CENTER ONLY)
Abs Immature Granulocytes: 0.01 10*3/uL (ref 0.00–0.07)
Basophils Absolute: 0 10*3/uL (ref 0.0–0.1)
Basophils Relative: 1 %
Eosinophils Absolute: 0.2 10*3/uL (ref 0.0–0.5)
Eosinophils Relative: 3 %
HCT: 42.8 % (ref 39.0–52.0)
Hemoglobin: 14.6 g/dL (ref 13.0–17.0)
Immature Granulocytes: 0 %
Lymphocytes Relative: 29 %
Lymphs Abs: 1.8 10*3/uL (ref 0.7–4.0)
MCH: 33.6 pg (ref 26.0–34.0)
MCHC: 34.1 g/dL (ref 30.0–36.0)
MCV: 98.6 fL (ref 80.0–100.0)
Monocytes Absolute: 0.6 10*3/uL (ref 0.1–1.0)
Monocytes Relative: 9 %
Neutro Abs: 3.6 10*3/uL (ref 1.7–7.7)
Neutrophils Relative %: 58 %
Platelet Count: 189 10*3/uL (ref 150–400)
RBC: 4.34 MIL/uL (ref 4.22–5.81)
RDW: 12.6 % (ref 11.5–15.5)
WBC Count: 6.2 10*3/uL (ref 4.0–10.5)
nRBC: 0 % (ref 0.0–0.2)

## 2021-04-01 LAB — CMP (CANCER CENTER ONLY)
ALT: 16 U/L (ref 0–44)
AST: 19 U/L (ref 15–41)
Albumin: 3.8 g/dL (ref 3.5–5.0)
Alkaline Phosphatase: 96 U/L (ref 38–126)
Anion gap: 11 (ref 5–15)
BUN: 24 mg/dL — ABNORMAL HIGH (ref 8–23)
CO2: 24 mmol/L (ref 22–32)
Calcium: 9.3 mg/dL (ref 8.9–10.3)
Chloride: 106 mmol/L (ref 98–111)
Creatinine: 1.19 mg/dL (ref 0.61–1.24)
GFR, Estimated: >60 mL/min (ref >60)
Glucose, Bld: 111 mg/dL — ABNORMAL HIGH (ref 70–99)
Potassium: 4 mmol/L (ref 3.5–5.1)
Sodium: 141 mmol/L (ref 135–145)
Total Bilirubin: 0.9 mg/dL (ref 0.3–1.2)
Total Protein: 6.9 g/dL (ref 6.5–8.1)

## 2021-04-01 LAB — TOTAL PROTEIN, URINE DIPSTICK: Protein, ur: 100 mg/dL — AB

## 2021-04-01 LAB — CEA (IN HOUSE-CHCC): CEA (CHCC-In House): 6.91 ng/mL — ABNORMAL HIGH (ref 0.00–5.00)

## 2021-04-01 MED ORDER — SODIUM CHLORIDE 0.9% FLUSH: 10.0000 mL | INTRAVENOUS | Status: DC | PRN

## 2021-04-01 MED ORDER — HEPARIN SOD (PORK) LOCK FLUSH 100 UNIT/ML IV SOLN: 500.0000 [IU] | Freq: Once | INTRAVENOUS | Status: DC | PRN

## 2021-04-01 NOTE — Progress Notes (Signed)
[place]   [phone] Fax:([phone]) [phone]   Clinic Follow up Note   Patient Care Team: [name], MD as PCP - General (Family Medicine) [name], MD as Consulting Physician (Cardiology) [name], MD as Consulting Physician (Gastroenterology) [name], MD as Consulting Physician (General Surgery) [name], MD as Consulting Physician (General Surgery) [name], MD as Consulting Physician (Urology) [name], MD as Consulting Physician (Medical Oncology)  Date of Service:  04/01/2021  CHIEF COMPLAINT: f/u of metastatic colon cancer  CURRENT THERAPY:  First line FOLFIRI q2weeks starting 01/28/20 for 8 cycles. Bevacizumab added with C2. Changed to maintenance Xeloda 2000 mg twice daily for 2 weeks on/1 week off and bevacizumab 06/02/20. Continue Beva every 3 weeks. C3 dose reduce to 1573m BID due to skin toxicity.   ASSESSMENT & PLAN:  Dave Vaughn a 75y.o. male with   1. Cancer of left colon, adenocarcinoma, stage IIIB (pT3N1cM0), MSI-stable, KRAS+ -Diagnosed in 01/2018. Treated with surgery and adjuvant FOLFOX. Due to side effects Oxaliplatin was stopped after 3 cycles and chemo was stopped after 6 cycles. He declined completing 6 months of chemo treatment.  -unfortunately, he developed local recurrence in left abdominal wall and peritoneal metastasis, 2 hypermetabolic liver lets in 72/1224 Abdominal wall biopsy conformed metastatic colorectal adenocarcinoma in 01/2020  -Began first line dose-reduced FOLFIRI on 01/28/2020 -Foundation One showed Kras (+) positive, he is not a candidate for EGFR inhibitor.  Bevacizumab was added with cycle 2  -S/p 8 cycles of FOLFIRI and bevacizumab from cycle 2; tolerated well with fatigue, nausea, diarrhea. CEA normalized after cycle 4, then switched to maintenance xeloda and bevacizumab  -Cycle 2 Xeloda reduced due to skin toxicity (skin rash to his forearms and  sensitivity to his feet) -CT CAP 01/29/21 showed overall stable disease, continue Xeloda 2 weeks on/1 week off and Beva every 3 weeks when urine protein less than 300. Beva has been held multiple times due to proteinuria  -he continues to have abdominal pain from his surgery, but his wound has healed. His last urine protein on 10/10 was high again. He also notes he will be out of town next week. In light of all of these, we will continue to hold Beva and Xeloda for now. We will plan for him to restart Xeloda on 04/13/21 when he returns. If his urine protein today is low, we will also proceed with Beva on 11/7. -I reviewed that his CEA has been slowly going up which is concerning for cancer progression. Today's result is pending, but we will plan to repeat scan in late Nov for Dec    2. Hypothyroidism -Previously on synthroid, TSH normal 2 years ago. He has not been taking synthroid for a while -TSH in 02/2020 up to 67, he went back on Synthroid since 02/16/20   3.  BPH  -Followed by urologist Dr. WLovena Neighbours-Pelvic MRI on 7/1 shows marked prostatomegaly with signs of BPH extending into the bladder base.  -Denies new or worsening urinary symptoms   4. Atrial Fibrillation -continue Eliquis. Rate controlled.  He can stop Eliquis periodically for bleeding hemorrhoids -Continue follow-up with cardiology -Plan to hold Eliquis for 5 days prior to upcoming abdominal surgery   5. CKD stage III -he has developed mild increased Cr since he started chemo, EGFR around 40-50's  -I encouraged him to control his blood pressure, cholesterol and BG. I also encouraged him to drink plenty of water.  -Will avoid  NSAIDs and CT IV contrast if GFR low -Improved today     PLAN: -hold Xeloda until he is back in town on 11/7 -his urine protein is 100today, will restart Beva on 11/7, no lab need on 11/7  -f/u on 11/28 before next cycle chemo, will order scan on next visit    No problem-specific Assessment & Plan notes  found for this encounter.   SUMMARY OF ONCOLOGIC HISTORY: Oncology History Overview Note  Cancer Staging Cancer of left colon [facility]) Staging form: Colon and Rectum, AJCC 8th Edition - Pathologic stage from 01/11/2018: Stage IIIB (pT3, pN1c, cM0) - Signed by [name], MD on 01/16/2018     Cancer of left colon ([place])  01/10/2018 Imaging   CT AP W Contrast 01/10/18  IMPRESSION: Irregular soft tissue density causing stricture of the mid descending colon likely the site of obstruction for the dilated small bowel. 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 No evidence of perforation.   Equivocal findings involving the appendix measuring 1.2 cm at the appendiceal tip with mucosal enhancement. No adjacent free fluid or inflammatory change. Findings are nonspecific, but can be seen with early acute appendicitis.   Mild prostatic enlargement. Increased density over the posterior bladder base likely due to the large prostatic impression although cannot completely exclude a bladder mass. Urology protocol CT or ultrasound may be helpful for better evaluation.   Mild cholelithiasis.   Stable 1.5 cm cystic structure over the lower pole right kidney likely slightly hyperdense cyst.   Diverticulosis of the colon.   Aortic Atherosclerosis (ICD10-I70.0).   01/11/2018 Cancer Staging   Staging form: Colon and Rectum, AJCC 8th Edition - Pathologic stage from 01/11/2018: Stage IIIB (pT3, pN1c, cM0) - Signed by [name], MD on 01/16/2018    01/11/2018 Surgery   LEFT COLON RESECTION, TAKEDOWN SPLENIC FLEXURE, COLOSTOMY by Dr. [name]    01/11/2018 Procedure   Colonoscopy 01/11/18 by Dr. [name]  - Malignant completely obstructing tumor in the mid descending colon. Tattooed. - Diverticulosis in the sigmoid colon. - Non-bleeding internal hemorrhoids. - No specimens collected.   01/11/2018 Pathology Results   Diagnosis 01/11/18  1. Colon, segmental resection for tumor, descending colon - INVASIVE COLORECTAL  ADENOCARCINOMA, 4 CM. - TUMOR EXTENDS INTO PERICOLONIC CONNECTIVE TISSUE. - TUMOR FOCALLY INVOLVES RADIAL MARGIN. - ONE MESENTERIC TUMOR DEPOSIT. - THIRTEEN BENIGN LYMPH NODES (0/13). 2. Colon, segmental resection, splenic flexure - BENIGN COLON. - NO EVIDENCE OF MALIGNANCY .   01/11/2018 Tumor Marker   Baseline CEA at 3.4   01/16/2018 Initial Diagnosis   Cancer of left colon ([place])   01/23/2018 Imaging   CT CHEST WO CONTRAST IMPRESSION: 1. No evidence for metastatic disease within the chest. 2. Small left pleural effusion with underlying opacities which may represent atelectasis. Right basilar atelectasis. 3. Few foci of gas within the upper abdomen in the omentum with surrounding fat stranding, likely postsurgical 4. Aortic Atherosclerosis (ICD10-I70.0).   03/08/2018 - 05/15/2018 Chemotherapy   adjuvant FOLOFX. Due to side effects of neuropahty Oxaliplatin was stopped after 3 cycles and chemo was stopped after 6 cycles. He declined completing 6 months of chemo treatment.     03/19/2018 Imaging   03/19/2018 CT AP IMPRESSION: 1. Interval partial left hemicolectomy and descending colostomy. 2. Heterogeneous soft tissue density along the left anterior renal fascia is most likely postoperative (favor fat necrosis). No well-defined fluid collection. 3. Mild left lower quadrant edema, new since 01/10/2018. This could be postoperative. Superimposed sigmoid diverticulitis and/or cystitis cannot be excluded. 4.  Subtle hyperenhancing nodule within the anterior bladder dome cannot be excluded. Consider nonemergent cystoscopy. When this is performed, recommend attention to the left ureterovesicular junction and distal left ureter to evaluate questionable soft tissue fullness. 5. Cholelithiasis. 6.  Aortic Atherosclerosis (ICD10-I70.0). 7. Prostatomegaly.   11/13/2018 Imaging   CT CAP WO Contrast 11/13/18  IMPRESSION: 1. Reversal of left lower quadrant colostomy with sigmoid  colon anastomosis. No complicating features. No findings for residual or recurrent tumor or metastatic disease involving the chest, abdomen or pelvis without contrast. 2. No acute abdominal/pelvic findings. 3. Gallbladder sludge and gallstones but no findings for acute cholecystitis. 4. Stable anterior abdominal wall hernia. 5. The right testicle is in the right inguinal canal.   10/03/2019 Imaging   CT CAP WO contrast  IMPRESSION: 1. New rounded density interposed between the prostate gland and anterior upper rectal wall could represent adenopathy or local extension of anterior rectal tumor. 2. Marked prostatomegaly, prostate volume 150 cubic cm. 3. Other imaging findings of potential clinical significance: Aortic Atherosclerosis (ICD10-I70.0). Coronary atherosclerosis. Trace right pleural effusion. Suspected cholelithiasis. Nonobstructive left nephrolithiasis. Multilevel lumbar impingement. Bilateral mildly retracted testicles. Hypodense exophytic lesion of the right kidney, most likely to be a cyst.   11/02/2019 Procedure   colonoscopy on 11/02/2019 by Dr. [name] Plan showed normal digital rectal exam, 3 polyps in the rectum, descending colon and cecum, and a prior sigmoid: Anastomosis characterized by erythema.  He found an extrinsic nonobstructing medium-sized mass in the proximal rectum about 4 cm in length, no internal rectal mass.   Diagnosis Surgical [P], colon, cecum, descending, rectal, polyp (3) - TUBULAR ADENOMA (TWO) - NO HIGH GRADE DYSPLASIA OR CARCINOMA. - COLONIC FRAGMENT WITH BENIGN LYMPHOID AGGREGATE.   12/07/2019 Imaging   MRI pelvis IMPRESSION: 1. Masslike area in the rectum suspicious for rectal neoplasm, likely T4b based on the appearance of soft tissue extending along the anterior peritoneal reflection and into the seminal vesicles. Correlation with recent colonoscopy results may be helpful. Area of anastomosis and other areas of the pelvis are not imaged on  today's exam.   12/21/2019 PET scan   IMPRESSION: 1. Unfortunately evidence for peritoneal metastasis. Intensely hypermetabolic nodules along the LEFT pericolic gutter. Favor hypermetabolic mass anterior to the rectum to represent serosal implant along the ventral surface of the rectum. 2. local recurrence within the LEFT abdominal wall at site prior colostomy. Intense hypermetabolic thickening of the rectus muscle at this site. 3. Two hypermetabolic hepatic metastasis.   01/15/2020 Relapse/Recurrence   FINAL MICROSCOPIC DIAGNOSIS:   A. SOFT TISSUE, LEFT ABDOMINAL WALL, BIOPSY:  - Adenocarcinoma.  - See comment.   COMMENT:   The morphology is consistent with metastatic colorectal adenocarcinoma.    01/28/2020 -  Chemotherapy   First line FOLFIRI q2weeks starting 01/28/20 for 8 cycles.  -----Bevacizumab added with C2.  -----Changed to maintenance Xeloda 2000 mg twice daily for 2 weeks on/1 week off and bevacizumab 06/02/20. Starting with C3, dose reduce to 1567m BID due to skin toxicity.   05/05/2020 Imaging   IMPRESSION: 1. Improved appearance, with reduced size of the hepatic metastatic lesions and reduced size of the peritoneal tumor implants. 2. Other imaging findings of potential clinical significance: Notable prostatomegaly. Multilevel impingement in the lumbar spine. Dependent density in the gallbladder possibly from sludge or gallstones. Degenerative glenohumeral arthropathy bilaterally. 3. Aortic atherosclerosis.   08/15/2020 Imaging   CT CAP  IMPRESSION: Chest Impression:   No evidence of thoracic metastasis   Abdomen / Pelvis Impression:   1.  Hepatic metastasis are no longer measurable by CT imaging. 2. Peritoneal nodular metastasis in the LEFT abdomen are decreased in size. 3. No evidence of new peritoneal disease. 4. Thickening and LEFT rectus muscle at site of prior metastasis. No interval change. 5. No evidence of new or progressive colorectal  carcinoma.     11/27/2020 Imaging   CT CAP  IMPRESSION: 1. There are multiple small pulmonary nodules in the right upper lobe that are new or enlarged compared to prior examination but measuring 4 mm or smaller, suspicious for pulmonary metastatic disease. 2. Unchanged peritoneal nodule adjacent to the tip of the spleen measuring 1.3 x 1.2 cm. Slightly peritoneal nodule adjacent to the splenic flexure measuring no greater than 6 mm, previously 8 mm. 3. No significant change in previously hypermetabolic soft tissue mass centered about the left lower quadrant colostomy site. 4. Previously established hypermetabolic hepatic metastatic disease remains inapparent by CT. 5. Status post sigmoid colon resection and reanastomosis. 6. Prostatomegaly with thickening of the decompressed urinary bladder, likely secondary to chronic outlet obstruction. 7. Cholelithiasis.   Aortic Atherosclerosis (ICD10-I70.0).   01/29/2021 Imaging   CT CAP   IMPRESSION: 1. Minimal interval progression of bilateral pulmonary nodules, concerning for metastatic disease. 2. No substantial change in size of the peritoneal nodule at the inferior tip of the spleen and splenic flexure nodule. 3. Nodular soft tissue measured at the ostomy site previously is not evident today. 4. Marked prostatomegaly. 5. Cholelithiasis. 6. Aortic Atherosclerosis (ICD10-I70.0).      INTERVAL HISTORY:  Dave Vaughn is here for a follow up of metastatic colon cancer. He was last seen by me on 03/16/21. He presents to the clinic alone. He reports he continues to have pain from his abdominal surgery. He notes he was prescribed gabapentin by his surgeon, which he feels is helping some. 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 He will also be in [place] the week of Thanksgiving to visit his son who is studying abroad.   All other systems were reviewed with the patient and are negative.  MEDICAL  HISTORY:  Past Medical History:  Diagnosis Date   Adenocarcinoma, colon ([place]) dx'd 01/2018   Anemia    taking iron supplements   Anxiety    Atrial fibrillation with RVR ([facility])    Colonic obstruction ([place]) 01/10/2018   Depression    Diverticulitis    Dysrhythmia    afib   History of kidney stones    Hypertension     SURGICAL HISTORY: Past Surgical History:  Procedure Laterality Date   ANKLE SURGERY Left    when he was in college   COLON RESECTION N/A 01/11/2018   Procedure: LEFT COLON RESECTION, TAKEDOWN SPLENIC FLEXURE, COLOSTOMY;  Surgeon: [name], MD;  Location: [facility];  Service: General;  Laterality: N/A;   COLONOSCOPY  01/11/2018   Procedure: COLONOSCOPY;  Surgeon: Jackquline Denmark, MD;  Location: [facility];  Service: Endoscopy;;   COLONOSCOPY  05/10/2018   colonscopy  05/10/2018   FOREIGN BODY REMOVAL ABDOMINAL N/A 03/09/2021   Procedure: EXCISION OF FOREIGN BODY;  Surgeon: Ralene Ok, MD;  Location: [facility];  Service: General;  Laterality: N/A;   HERNIA REPAIR     HERNIA REPAIR  03/02/2019   EXPLORATORY LAPAROTOMY (N/A Abdomen)   INCISIONAL HERNIA REPAIR N/A 03/02/2019   Procedure: INCISIONAL HERNIA REPAIR , RECTORECTUS VS TAR HERNIA REPAIR;  Surgeon: Ralene Ok, MD;  Location: [place];  Service: General;  Laterality:  N/A;   INSERTION OF MESH N/A 03/02/2019   Procedure: Insertion Of Mesh;  Surgeon: Ralene Ok, MD;  Location: [place];  Service: General;  Laterality: N/A;   LAPAROTOMY N/A 03/02/2019   Procedure: EXPLORATORY LAPAROTOMY;  Surgeon: Ralene Ok, MD;  Location: [place];  Service: General;  Laterality: N/A;   LAPAROTOMY N/A 03/09/2021   Procedure: EXPLORATION OF ABDOMINAL WALL;  Surgeon: Ralene Ok, MD;  Location: [place];  Service: General;  Laterality: N/A;   LYSIS OF ADHESION N/A 06/12/2018   Procedure: LYSIS OF ADHESIONS;  Surgeon: [name], MD;  Location: [facility];  Service: General;  Laterality: N/A;   LYSIS OF ADHESION N/A 03/02/2019    Procedure: Lysis Of Adhesion;  Surgeon: Ralene Ok, MD;  Location: [place];  Service: General;  Laterality: N/A;   PORTACATH PLACEMENT Right 03/07/2018   Procedure: INSERTION PORT-A-CATH RIGHT SUBCLAVIAN;  Surgeon: [name], MD;  Location: [place];  Service: General;  Laterality: Right;   PROCTOSCOPY N/A 06/12/2018   Procedure: RIGID PROCTOSCOPY;  Surgeon: [name], MD;  Location: [facility];  Service: General;  Laterality: N/A;   thumb surgery   2018   cyst removal    I have reviewed the social history and family history with the patient and they are unchanged from previous note.  ALLERGIES:  has No Known Allergies.  MEDICATIONS:  Current Outpatient Medications  Medication Sig Dispense Refill   apixaban (ELIQUIS) 5 MG TABS tablet Take 1 tablet (5 mg total) by mouth 2 (two) times daily. 60 tablet 0   b complex vitamins tablet Take 1 tablet by mouth daily.     capecitabine (XELODA) 500 MG tablet TAKE 3 TABLETS BY MOUTH  EVERY 12 HOURS WITHIN 30  MINUTES OF A MEAL FOR 14  DAYS ON THEN 7 DAYS OFF 84 tablet 1   diltiazem (CARDIZEM CD) 360 MG 24 hr capsule Take 1 capsule (360 mg total) by mouth daily. 90 capsule 3   levothyroxine (SYNTHROID) 75 MCG tablet TAKE 1 TABLET(75 MCG) BY MOUTH DAILY (Patient taking differently: Take 75 mcg by mouth daily.) 90 tablet 1   lidocaine (LIDODERM) 5 % Place 1 patch onto the skin daily. Remove & Discard patch within 12 hours or as directed by MD (Patient not taking: No sig reported) 30 patch 0   lidocaine-prilocaine (EMLA) cream Apply 1 application topically as needed. (Patient taking differently: Apply 1 application topically as needed (port site).) 30 g 1   Multiple Vitamins-Iron (MULTIVITAMIN/IRON PO) Take 1 tablet by mouth daily.      prochlorperazine (COMPAZINE) 10 MG tablet Take 1 tablet (10 mg total) by mouth every 6 (six) hours as needed for nausea or vomiting. 30 tablet 0   tamsulosin (FLOMAX) 0.4 MG CAPS capsule Take 0.4 mg by mouth 2 (two)  times daily.      traMADol (ULTRAM) 50 MG tablet Take 1 tablet (50 mg total) by mouth every 6 (six) hours as needed. 20 tablet 0   urea (CARMOL) 10 % cream Apply topically as needed. (Patient not taking: No sig reported) 142 g 2   No current facility-administered medications for this visit.    PHYSICAL EXAMINATION: ECOG PERFORMANCE STATUS: 1 - Symptomatic but completely ambulatory  Vitals:   04/01/21 0838  BP: 131/80  Pulse: 65  Resp: 18  Temp: 98 F (36.7 C)  SpO2: 97%   Wt Readings from Last 3 Encounters:  04/01/21 102.3 kg  03/16/21 100 kg  03/09/21 93 kg  GENERAL:alert, no distress and comfortable SKIN: skin color normal, no rashes or significant lesions EYES: normal, Conjunctiva are pink and non-injected, sclera clear  NEURO: alert & oriented x 3 with fluent speech  LABORATORY DATA:  I have reviewed the data as listed CBC Latest Ref Rng & Units 04/01/2021 03/16/2021 02/23/2021  WBC 4.0 - 10.5 K/uL 6.2 5.9 5.6  Hemoglobin 13.0 - 17.0 g/dL 14.6 15.4 14.5  Hematocrit 39.0 - 52.0 % 42.8 44.0 42.4  Platelets 150 - 400 K/uL 189 195 144(L)     CMP Latest Ref Rng & Units 04/01/2021 03/16/2021 02/23/2021  Glucose 70 - 99 mg/dL 111(H) 99 104(H)  BUN 8 - 23 mg/dL 24(H) 22 17  Creatinine 0.61 - 1.24 mg/dL 1.19 1.18 1.18  Sodium 135 - 145 mmol/L 141 140 141  Potassium 3.5 - 5.1 mmol/L 4.0 4.1 4.0  Chloride 98 - 111 mmol/L 106 103 106  CO2 22 - 32 mmol/L 24 27 24   Calcium 8.9 - 10.3 mg/dL 9.3 9.6 9.2  Total Protein 6.5 - 8.1 g/dL 6.9 7.2 7.0  Total Bilirubin 0.3 - 1.2 mg/dL 0.9 1.3(H) 0.9  Alkaline Phos 38 - 126 U/L 96 105 98  AST 15 - 41 U/L 19 21 21   ALT 0 - 44 U/L 16 16 14       RADIOGRAPHIC STUDIES: I have personally reviewed the radiological images as listed and agreed with the findings in the report. No results found.    No orders of the defined types were placed in this encounter.  All questions were answered. The patient knows to call the clinic with  any problems, questions or concerns. No barriers to learning was detected. The total time spent in the appointment was 30 minutes.     [name], MD 04/01/2021   I, Wilburn Mylar, am acting as scribe for [name], MD.   I have reviewed the above documentation for accuracy and completeness, and I agree with the above.

## 2021-04-02 ENCOUNTER — Telehealth: Payer: Self-pay | Admitting: Hematology

## 2021-04-02 NOTE — Telephone Encounter (Signed)
Scheduled per sch msg. Called and left msg  

## 2021-04-06 ENCOUNTER — Emergency Department (HOSPITAL_BASED_OUTPATIENT_CLINIC_OR_DEPARTMENT_OTHER): Payer: Commercial Managed Care - PPO

## 2021-04-06 ENCOUNTER — Ambulatory Visit: Payer: Commercial Managed Care - PPO

## 2021-04-06 ENCOUNTER — Emergency Department (HOSPITAL_BASED_OUTPATIENT_CLINIC_OR_DEPARTMENT_OTHER)
Admission: EM | Admit: 2021-04-06 | Discharge: 2021-04-06 | Disposition: A | Discharge status: Home / Self Care | Payer: Commercial Managed Care - PPO | Attending: Emergency Medicine | Admitting: Emergency Medicine

## 2021-04-06 ENCOUNTER — Other Ambulatory Visit: Payer: Commercial Managed Care - PPO

## 2021-04-06 ENCOUNTER — Ambulatory Visit: Payer: Commercial Managed Care - PPO | Admitting: Hematology

## 2021-04-06 ENCOUNTER — Other Ambulatory Visit: Payer: Self-pay

## 2021-04-06 ENCOUNTER — Encounter (HOSPITAL_BASED_OUTPATIENT_CLINIC_OR_DEPARTMENT_OTHER): Payer: Self-pay | Admitting: Emergency Medicine

## 2021-04-06 DIAGNOSIS — N3001 Acute cystitis with hematuria: Secondary | ICD-10-CM | POA: Insufficient documentation

## 2021-04-06 DIAGNOSIS — I129 Hypertensive chronic kidney disease with stage 1 through stage 4 chronic kidney disease, or unspecified chronic kidney disease: Secondary | ICD-10-CM | POA: Diagnosis not present

## 2021-04-06 DIAGNOSIS — Z7901 Long term (current) use of anticoagulants: Secondary | ICD-10-CM | POA: Insufficient documentation

## 2021-04-06 DIAGNOSIS — Z79899 Other long term (current) drug therapy: Secondary | ICD-10-CM | POA: Insufficient documentation

## 2021-04-06 DIAGNOSIS — E039 Hypothyroidism, unspecified: Secondary | ICD-10-CM | POA: Insufficient documentation

## 2021-04-06 DIAGNOSIS — Z87891 Personal history of nicotine dependence: Secondary | ICD-10-CM | POA: Diagnosis not present

## 2021-04-06 DIAGNOSIS — N183 Chronic kidney disease, stage 3 unspecified: Secondary | ICD-10-CM | POA: Insufficient documentation

## 2021-04-06 DIAGNOSIS — N179 Acute kidney failure, unspecified: Secondary | ICD-10-CM | POA: Insufficient documentation

## 2021-04-06 DIAGNOSIS — I4891 Unspecified atrial fibrillation: Secondary | ICD-10-CM | POA: Insufficient documentation

## 2021-04-06 DIAGNOSIS — R350 Frequency of micturition: Secondary | ICD-10-CM | POA: Diagnosis present

## 2021-04-06 LAB — URINALYSIS, ROUTINE W REFLEX MICROSCOPIC
Glucose, UA: NEGATIVE mg/dL
Ketones, ur: NEGATIVE mg/dL
Nitrite: NEGATIVE
Protein, ur: >300 mg/dL — AB
Specific Gravity, Urine: 1.035 — ABNORMAL HIGH (ref 1.005–1.030)
pH: 6 (ref 5.0–8.0)

## 2021-04-06 LAB — BASIC METABOLIC PANEL
Anion gap: 12 (ref 5–15)
BUN: 33 mg/dL — ABNORMAL HIGH (ref 8–23)
CO2: 25 mmol/L (ref 22–32)
Calcium: 9.6 mg/dL (ref 8.9–10.3)
Chloride: 97 mmol/L — ABNORMAL LOW (ref 98–111)
Creatinine, Ser: 1.92 mg/dL — ABNORMAL HIGH (ref 0.61–1.24)
GFR, Estimated: 36 mL/min — ABNORMAL LOW (ref >60)
Glucose, Bld: 118 mg/dL — ABNORMAL HIGH (ref 70–99)
Potassium: 3.7 mmol/L (ref 3.5–5.1)
Sodium: 134 mmol/L — ABNORMAL LOW (ref 135–145)

## 2021-04-06 LAB — CBC
HCT: 41.4 % (ref 39.0–52.0)
Hemoglobin: 13.8 g/dL (ref 13.0–17.0)
MCH: 32.9 pg (ref 26.0–34.0)
MCHC: 33.3 g/dL (ref 30.0–36.0)
MCV: 98.8 fL (ref 80.0–100.0)
Platelets: 161 10*3/uL (ref 150–400)
RBC: 4.19 MIL/uL — ABNORMAL LOW (ref 4.22–5.81)
RDW: 13.2 % (ref 11.5–15.5)
WBC: 17.5 10*3/uL — ABNORMAL HIGH (ref 4.0–10.5)
nRBC: 0 % (ref 0.0–0.2)

## 2021-04-06 MED ORDER — CEPHALEXIN 500 MG PO CAPS: 500.0000 mg | ORAL_CAPSULE | Freq: Four times a day (QID) | ORAL | 0 refills | Status: DC

## 2021-04-06 MED ORDER — SODIUM CHLORIDE 0.9 % IV BOLUS
1000.0000 mL | Freq: Once | INTRAVENOUS | Status: AC
  Administered 2021-04-06: 1000 mL via INTRAVENOUS

## 2021-04-06 MED ORDER — SODIUM CHLORIDE 0.9 % IV SOLN
1.0000 g | Freq: Once | INTRAVENOUS | Status: AC
  Administered 2021-04-06: 1 g via INTRAVENOUS
  Filled 2021-04-06: qty 10

## 2021-04-06 NOTE — ED Notes (Signed)
Patient complaining of lower abdominal pain and pressure similar to urinary retention he has had before.  Bladder scan performed at bedside with small amounts of urine seen on multiple scans.

## 2021-04-06 NOTE — Discharge Instructions (Signed)
You were seen in the emergency department for lower abdominal pain and urinary symptoms.  Your urinalysis looks concerning for infection.  You had blood work done that showed your infection cell count elevated and for you to be dehydrated.  Your CAT scan also showed some signs of bladder infection.  Please increase your fluid intake.  We are prescribing you some antibiotics.  Follow-up with your primary care doctor and [facility].  Return to the emergency department if any worsening or concerning symptoms.

## 2021-04-06 NOTE — ED Provider Notes (Signed)
[facility] EMERGENCY DEPT Provider Note   CSN: [id number] Arrival date & time: 04/06/21  1637     History Chief Complaint  Patient presents with   Urinary Frequency    Dave Vaughn is a 75 y.o. male.  He is here with 4 days of suprapubic pain and urinary frequency and poor urine output.  He had 1 day of nausea and vomiting and had a low-grade temp here.  They talk to his urologist and it was recommended that he come to the ED for evaluation and a possible catheter if he is obstructed.  He is required catheter intermittently since his colon surgery for colon cancer.  The history is provided by the patient.  Urinary Frequency This is a new problem. The current episode started more than 2 days ago. The problem occurs hourly. The problem has not changed since onset.Associated symptoms include abdominal pain. Pertinent negatives include no chest pain, no headaches and no shortness of breath. Nothing aggravates the symptoms. Nothing relieves the symptoms. He has tried rest for the symptoms. The treatment provided no relief.      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   Anemia    taking iron supplements   Anxiety    Atrial fibrillation with RVR (HCC)    Colonic obstruction ([place]) 01/10/2018   Depression    Diverticulitis    Dysrhythmia    afib   History of kidney stones    Hypertension     Patient Active Problem List   Diagnosis Date Noted   Hypothyroid 11/24/2020   Goals of care, counseling/discussion 01/17/2020   S/P hernia repair 03/02/2019   CKD (chronic kidney disease) stage 3, GFR 30-59 ml/min ([place]) 06/13/2018   Hypomagnesemia 06/13/2018   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 06/12/2018 06/12/2018   Chronic anticoagulation 06/12/2018   Port-A-Cath in place 03/22/2018   Cancer of left colon ([place]) 01/16/2018   Chronic atrial fibrillation 01/11/2018    Past Surgical History:  Procedure  Laterality Date   ANKLE SURGERY Left    when he was in college   COLON RESECTION N/A 01/11/2018   Procedure: LEFT COLON RESECTION, TAKEDOWN SPLENIC FLEXURE, COLOSTOMY;  Surgeon: [name], MD;  [facility];  Service: General;  Laterality: N/A;   COLONOSCOPY  01/11/2018   Procedure: COLONOSCOPY;  Surgeon: Jackquline Denmark, MD;  [facility];  Service: Endoscopy;;   COLONOSCOPY  05/10/2018   colonscopy  05/10/2018   FOREIGN BODY REMOVAL ABDOMINAL N/A 03/09/2021   Procedure: EXCISION OF FOREIGN BODY;  Surgeon: Ralene Ok, MD;  Location: [place];  Service: General;  Laterality: N/A;   HERNIA REPAIR     HERNIA REPAIR  03/02/2019   EXPLORATORY LAPAROTOMY (N/A Abdomen)   INCISIONAL HERNIA REPAIR N/A 03/02/2019   Procedure: INCISIONAL HERNIA REPAIR , RECTORECTUS VS TAR HERNIA REPAIR;  Surgeon: Ralene Ok, MD;  Location: [place];  Service: General;  Laterality: N/A;   INSERTION OF MESH N/A 03/02/2019   Procedure: Insertion Of Mesh;  Surgeon: Ralene Ok, MD;  Location: [facility] [place];  Service: General;  Laterality: N/A;   LAPAROTOMY N/A 03/02/2019   Procedure: EXPLORATORY LAPAROTOMY;  Surgeon: Ralene Ok, MD;  Location: [place];  Service: General;  Laterality: N/A;   LAPAROTOMY N/A 03/09/2021   Procedure: EXPLORATION OF ABDOMINAL WALL;  Surgeon: Ralene Ok, MD;  Location: [place];  Service: General;  Laterality: N/A;   LYSIS OF ADHESION N/A 06/12/2018   Procedure: LYSIS OF  ADHESIONS;  Surgeon: Zakira Ressel Boston, MD;  [facility];  Service: General;  Laterality: N/A;   LYSIS OF ADHESION N/A 03/02/2019   Procedure: Lysis Of Adhesion;  Surgeon: Ralene Ok, MD;  Location: [facility];  Service: General;  Laterality: N/A;   PORTACATH PLACEMENT Right 03/07/2018   Procedure: INSERTION PORT-A-CATH RIGHT SUBCLAVIAN;  Surgeon: [name], MD;  Location: [place] [facility];  Service: General;  Laterality: Right;   PROCTOSCOPY N/A 06/12/2018   Procedure: RIGID PROCTOSCOPY;  Surgeon: Silvano Garofano Boston, MD;   [facility];  Service: General;  Laterality: N/A;   thumb surgery   2018   cyst removal       Family History  Problem Relation Age of Onset   Breast cancer Mother 35       metastatin; recurrence x7   Heart attack Father 68   Cancer Daughter        melanoma, leukemia   Esophageal cancer Neg Hx    Colon cancer Neg Hx    Rectal cancer Neg Hx    Ulcerative colitis Neg Hx     Social History   Tobacco Use   Smoking status: Never   Smokeless tobacco: Former    Types: Chew    Quit date: 10/22/2019  Vaping Use   Vaping Use: Never used  Substance Use Topics   Alcohol use: Yes    Alcohol/week: 2.0 standard drinks    Types: 1 Cans of beer, 1 Shots of liquor per week    Comment: sometimes   Drug use: Never    Home Medications Prior to Admission medications   Medication Sig Start Date End Date Taking? Authorizing Provider  apixaban (ELIQUIS) 5 MG TABS tablet Take 1 tablet (5 mg total) by mouth 2 (two) times daily. 01/17/18   Georgette Shell, MD  b complex vitamins tablet Take 1 tablet by mouth daily.    [provider]  capecitabine (XELODA) 500 MG tablet TAKE 3 TABLETS BY MOUTH  EVERY 12 HOURS WITHIN 30  MINUTES OF A MEAL FOR 14  DAYS ON THEN 7 DAYS OFF 03/14/21   [name], MD  diltiazem (CARDIZEM CD) 360 MG 24 hr capsule Take 1 capsule (360 mg total) by mouth daily. 05/06/20   Jerline Pain, MD  levothyroxine (SYNTHROID) 75 MCG tablet TAKE 1 TABLET(75 MCG) BY MOUTH DAILY Patient taking differently: Take 75 mcg by mouth daily. 12/14/20   Koberlein, Steele Berg, MD  lidocaine (LIDODERM) 5 % Place 1 patch onto the skin daily. Remove & Discard patch within 12 hours or as directed by MD Patient not taking: No sig reported 02/02/21   [name], MD  lidocaine-prilocaine (EMLA) cream Apply 1 application topically as needed. Patient taking differently: Apply 1 application topically as needed (port site). 07/14/20   [name], MD  Multiple Vitamins-Iron (MULTIVITAMIN/IRON PO)  Take 1 tablet by mouth daily.     [provider]  prochlorperazine (COMPAZINE) 10 MG tablet Take 1 tablet (10 mg total) by mouth every 6 (six) hours as needed for nausea or vomiting. 01/28/20   [name], MD  tamsulosin (FLOMAX) 0.4 MG CAPS capsule Take 0.4 mg by mouth 2 (two) times daily.     [provider]  traMADol (ULTRAM) 50 MG tablet Take 1 tablet (50 mg total) by mouth every 6 (six) hours as needed. 03/09/21 03/09/22  Ralene Ok, MD  urea (CARMOL) 10 % cream Apply topically as needed. Patient not taking: No sig reported 01/12/21   [name],  MD    Allergies    Patient has no known allergies.  Review of Systems   Review of Systems  Constitutional:  Positive for fever.  HENT:  Negative for sore throat.   Eyes:  Negative for visual disturbance.  Respiratory:  Negative for shortness of breath.   Cardiovascular:  Negative for chest pain.  Gastrointestinal:  Positive for abdominal pain, nausea and vomiting. Negative for constipation.  Genitourinary:  Positive for frequency. Negative for dysuria.  Musculoskeletal:  Negative for neck pain.  Skin:  Negative for rash.  Neurological:  Negative for headaches.   Physical Exam Updated Vital Signs BP (!) 126/92 (BP Location: Right Arm)   Pulse (!) 114   Temp 99.1 F (37.3 C) (Oral)   Resp 20   Ht 5\' 11"  (1.803 m)   Wt 93 kg   SpO2 96%   BMI 28.59 kg/m   Physical Exam Vitals and nursing note reviewed.  Constitutional:      Appearance: Normal appearance. He is well-developed.  HENT:     Head: Normocephalic and atraumatic.  Eyes:     Conjunctiva/sclera: Conjunctivae normal.  Cardiovascular:     Rate and Rhythm: Normal rate and regular rhythm.     Heart sounds: No murmur heard. Pulmonary:     Effort: Pulmonary effort is normal. No respiratory distress.     Breath sounds: Normal breath sounds.  Abdominal:     Palpations: Abdomen is soft.     Tenderness: There is no abdominal tenderness. There is no  guarding or rebound.  Musculoskeletal:        General: Normal range of motion.     Cervical back: Neck supple.     Right lower leg: No edema.     Left lower leg: No edema.  Skin:    General: Skin is warm and dry.  Neurological:     General: No focal deficit present.     Mental Status: He is alert.    ED Results / Procedures / Treatments   Labs (all labs ordered are listed, but only abnormal results are displayed) Labs Reviewed  URINALYSIS, ROUTINE W REFLEX MICROSCOPIC - Abnormal; Notable for the following components:      Result Value   APPearance HAZY (*)    Specific Gravity, Urine 1.035 (*)    Hgb urine dipstick LARGE (*)    Bilirubin Urine SMALL (*)    Protein, ur >300 (*)    Leukocytes,Ua SMALL (*)    Bacteria, UA FEW (*)    All other components within normal limits  BASIC METABOLIC PANEL - Abnormal; Notable for the following components:   Sodium 134 (*)    Chloride 97 (*)    Glucose, Bld 118 (*)    BUN 33 (*)    Creatinine, Ser 1.92 (*)    GFR, Estimated 36 (*)    All other components within normal limits  CBC - Abnormal; Notable for the following components:   WBC 17.5 (*)    RBC 4.19 (*)    All other components within normal limits    EKG None  Radiology CT Renal Stone Study  Result Date: 04/06/2021 CLINICAL DATA:  Hematuria and lower abdominal pain. History of colorectal cancer. EXAM: CT ABDOMEN AND PELVIS WITHOUT CONTRAST TECHNIQUE: Multidetector CT imaging of the abdomen and pelvis was performed following the standard protocol without IV contrast. COMPARISON:  CT chest abdomen and pelvis 01/29/2021. FINDINGS: Lower chest: There is a trace right pleural effusion. Hepatobiliary: Gallstones are present. There  is no biliary ductal dilatation. The liver is within normal limits. Pancreas: Unremarkable. No pancreatic ductal dilatation or surrounding inflammatory changes. Spleen: Normal in size without focal abnormality. Adrenals/Urinary Tract: Kidneys and adrenal  glands appear within normal limits. There is diffuse bladder wall thickening with surrounding inflammatory stranding. The bladder is decompressed which limits evaluation. Stomach/Bowel: Partial colectomy changes are present. There is gaseous distention of the colon with relative transition just proximal to the anastomosis. No wall thickening or surrounding inflammation. Appendix within normal limits. Small bowel and stomach within normal limits. Vascular/Lymphatic: Aortic atherosclerosis. No enlarged abdominal or pelvic lymph nodes. Reproductive: Prostate gland is enlarged measuring 6.7 cm in transverse dimension similar to the prior study. Other: There is anterior abdominal wall scarring. There is a small fluid collection in the subcutaneous tissues at the level of the prior ostomy in the left abdomen. This collection measures 7.4 x 1.5 x 5.7 cm. Intramuscular hyperdensity at this level measures up to 1.5 cm in thickness image 2/40. This was not apparent on the prior study. Nodularity in the left upper quadrant and adjacent to the tip of the spleen appears unchanged from the prior study. No ascites or free air. Musculoskeletal: Degenerative changes affect the spine IMPRESSION: 1. Bladder wall thickening with surrounding inflammation concerning for cystitis. 2. Partial colectomy. Colon is air-filled with relative transition just proximal to the anastomosis. Stricture or recurrence at this level would be difficult to exclude. No focal mass identified. 3. There is a new subcutaneous fluid collection at the level of prior ostomy in the left abdominal wall. Infection not excluded. 4. There is new intramuscular hyperdensity at the level of the prior ostomy, indeterminate. Findings may related to scarring, tumor recurrence or metastatic disease is not excluded. 5. Stable nodularity in the left upper quadrant. 6. Cholelithiasis. 7. Trace right pleural effusion. 8. Stable prostatomegaly. 9.  Aortic Atherosclerosis  (ICD10-I70.0). Electronically Signed   By: Ronney Asters M.D.   On: 04/06/2021 21:15    Procedures Procedures   Medications Ordered in ED Medications  sodium chloride 0.9 % bolus 1,000 mL (0 mLs Intravenous Stopped 04/06/21 2251)  cefTRIAXone (ROCEPHIN) 1 g in sodium chloride 0.9 % 100 mL IVPB (0 g Intravenous Stopped 04/06/21 2251)    ED Course  I have reviewed the triage vital signs and the nursing notes.  Pertinent labs & imaging results that were available during my care of the patient were reviewed by me and considered in my medical decision making (see chart for details).    MDM Rules/Calculators/A&P                           This patient complains of suprapubic abdominal pain urinary frequency; this involves an extensive number of treatment Options and is a complaint that carries with it a high risk of complications and Morbidity. The differential includes UTI, retention, renal failure, kidney stone  I ordered, reviewed and interpreted labs, which included CBC with elevated white count, stable hemoglobin, chemistries with new AKI, urinalysis with possible signs of infection with elevated red blood cells white blood cells and bacteria I ordered medication IV fluids IV antibiotics I ordered imaging studies which included CT abdomen pelvis and I independently    visualized and interpreted imaging which showed cholecystitis.  Also does show some new findings near his prior knee reversal.  Patient does not appear to have any particular tenderness in that area. Additional history obtained from patient's family member Previous records  obtained and reviewed in epic no recent admissions  After the interventions stated above, I reevaluated the patient and found patient to be hemodynamically stable.  Mildly tachycardic.  He clinically feels improved and not demonstrating any signs of retention at this time so we will hold off on urinary catheter.  Will cover with antibiotics and  recommended close follow-up with urology.  Return instructions discussed  Final Clinical Impression(s) / ED Diagnoses Final diagnoses:  Acute cystitis with hematuria  AKI (acute kidney injury) ([place])    Rx / DC Orders ED Discharge Orders          Ordered    cephALEXin (KEFLEX) 500 MG capsule  4 times daily        04/06/21 2229             Hayden Rasmussen, MD 04/07/21 [phone]

## 2021-04-06 NOTE — ED Triage Notes (Signed)
Ptia pov from home with urinary urgency and decreased urinary output. Pt states he has had surgery for colon cancer three years ago and had problems with retention, for which he had indwelling catheter off and on for a while. Pt states the symptoms are similar and that his urologist sent him to ED and scheduled a follow up, assuming that he would leave with an indwelling catheter. Pt's bladder scanned after urination and had volume of 4ml (x3). Pt reports some suprapubic pain that varies in intensity x 4 days. Pt alert & oriented, nad noted.

## 2021-04-09 ENCOUNTER — Encounter (HOSPITAL_BASED_OUTPATIENT_CLINIC_OR_DEPARTMENT_OTHER): Payer: Self-pay

## 2021-04-09 ENCOUNTER — Emergency Department (HOSPITAL_BASED_OUTPATIENT_CLINIC_OR_DEPARTMENT_OTHER)
Admission: EM | Admit: 2021-04-09 | Discharge: 2021-04-09 | Disposition: A | Discharge status: Home / Self Care | Payer: Commercial Managed Care - PPO | Attending: Emergency Medicine | Admitting: Emergency Medicine

## 2021-04-09 ENCOUNTER — Other Ambulatory Visit: Payer: Self-pay

## 2021-04-09 DIAGNOSIS — R300 Dysuria: Secondary | ICD-10-CM | POA: Diagnosis present

## 2021-04-09 DIAGNOSIS — Z79899 Other long term (current) drug therapy: Secondary | ICD-10-CM | POA: Insufficient documentation

## 2021-04-09 DIAGNOSIS — N3289 Other specified disorders of bladder: Secondary | ICD-10-CM | POA: Diagnosis not present

## 2021-04-09 DIAGNOSIS — Z87891 Personal history of nicotine dependence: Secondary | ICD-10-CM | POA: Insufficient documentation

## 2021-04-09 DIAGNOSIS — I10 Essential (primary) hypertension: Secondary | ICD-10-CM | POA: Insufficient documentation

## 2021-04-09 DIAGNOSIS — I4891 Unspecified atrial fibrillation: Secondary | ICD-10-CM | POA: Diagnosis not present

## 2021-04-09 DIAGNOSIS — Z7901 Long term (current) use of anticoagulants: Secondary | ICD-10-CM | POA: Insufficient documentation

## 2021-04-09 DIAGNOSIS — N390 Urinary tract infection, site not specified: Secondary | ICD-10-CM | POA: Insufficient documentation

## 2021-04-09 LAB — URINALYSIS, ROUTINE W REFLEX MICROSCOPIC
Bilirubin Urine: NEGATIVE
Glucose, UA: NEGATIVE mg/dL
Ketones, ur: NEGATIVE mg/dL
Nitrite: NEGATIVE
Protein, ur: 30 mg/dL — AB
Specific Gravity, Urine: 1.015 (ref 1.005–1.030)
WBC, UA: >50 WBC/hpf — ABNORMAL HIGH (ref 0–5)
pH: 6 (ref 5.0–8.0)

## 2021-04-09 MED ORDER — CIPROFLOXACIN HCL 500 MG PO TABS: 500.0000 mg | ORAL_TABLET | Freq: Two times a day (BID) | ORAL | 0 refills | Status: DC

## 2021-04-09 MED ORDER — BELLADONNA ALKALOIDS-OPIUM 16.2-30 MG RE SUPP: 1.0000 | Freq: Three times a day (TID) | RECTAL | 0 refills | Status: DC | PRN

## 2021-04-09 MED ORDER — CIPROFLOXACIN HCL 500 MG PO TABS
500.0000 mg | ORAL_TABLET | Freq: Once | ORAL | Status: AC
  Administered 2021-04-09: 500 mg via ORAL
  Filled 2021-04-09: qty 1

## 2021-04-09 MED ORDER — BELLADONNA ALKALOIDS-OPIUM 16.2-30 MG RE SUPP: 1.0000 | Freq: Once | RECTAL | Status: DC

## 2021-04-09 MED ORDER — HYOSCYAMINE SULFATE 0.125 MG SL SUBL
0.2500 mg | SUBLINGUAL_TABLET | Freq: Once | SUBLINGUAL | Status: AC
  Administered 2021-04-09: 0.25 mg via SUBLINGUAL
  Filled 2021-04-09: qty 2

## 2021-04-09 NOTE — ED Provider Notes (Signed)
[facility] Provider Note: Dave Spurling, MD, FACEP  CSN: [id number] MRN: [id number] ARRIVAL: 04/09/21 at [place]: [id number]   CHIEF COMPLAINT  Urinary Retention   HISTORY OF PRESENT ILLNESS  04/09/21 3:08 AM Dave Vaughn is a 75 y.o. male who was recently diagnosed with a urinary tract infection and treated with Keflex 4 times daily.  Urine culture was not done.  His symptoms had gotten better but yesterday evening he had difficulty urinating and had an increase in pain.  He is now having pain in his bladder to the base of his penis which she describes as burning in nature.  It is worse when attempting to urinate but he feels he cannot urinate (bladder scan in the ED showed only 172 mL retained urine), but he is able to void small amounts.  He describes the pain as moderate to severe.  He also had a fever with this to 100.7 which was relieved with Tylenol prior to arrival.   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   Anemia    taking iron supplements   Anxiety    Atrial fibrillation with RVR ([place])    Colonic obstruction ([place]) 01/10/2018   Depression    Diverticulitis    Dysrhythmia    afib   History of kidney stones    Hypertension     Past Surgical History:  Procedure Laterality Date   ANKLE SURGERY Left    when he was in college   COLON RESECTION N/A 01/11/2018   Procedure: LEFT COLON RESECTION, TAKEDOWN SPLENIC FLEXURE, COLOSTOMY;  Surgeon: [name], MD;  [facility];  Service: General;  Laterality: N/A;   COLONOSCOPY  01/11/2018   Procedure: COLONOSCOPY;  Surgeon: Jackquline Denmark, MD;  [facility];  Service: Endoscopy;;   COLONOSCOPY  05/10/2018   colonscopy  05/10/2018   FOREIGN BODY REMOVAL ABDOMINAL N/A 03/09/2021   Procedure: EXCISION OF FOREIGN BODY;  Surgeon: Ralene Ok, MD;  Location: [place];  Service: General;  Laterality: N/A;   HERNIA REPAIR     HERNIA REPAIR  03/02/2019   EXPLORATORY  LAPAROTOMY (N/A Abdomen)   INCISIONAL HERNIA REPAIR N/A 03/02/2019   Procedure: INCISIONAL HERNIA REPAIR , RECTORECTUS VS TAR HERNIA REPAIR;  Surgeon: Ralene Ok, MD;  Location: [place];  Service: General;  Laterality: N/A;   INSERTION OF MESH N/A 03/02/2019   Procedure: Insertion Of Mesh;  Surgeon: Ralene Ok, MD;  Location: [place];  Service: General;  Laterality: N/A;   LAPAROTOMY N/A 03/02/2019   Procedure: EXPLORATORY LAPAROTOMY;  Surgeon: Ralene Ok, MD;  Location: [place];  Service: General;  Laterality: N/A;   LAPAROTOMY N/A 03/09/2021   Procedure: EXPLORATION OF ABDOMINAL WALL;  Surgeon: Ralene Ok, MD;  Location: [place];  Service: General;  Laterality: N/A;   LYSIS OF ADHESION N/A 06/12/2018   Procedure: LYSIS OF ADHESIONS;  Surgeon: [name], MD;  [facility];  Service: General;  Laterality: N/A;   LYSIS OF ADHESION N/A 03/02/2019   Procedure: Lysis Of Adhesion;  Surgeon: Ralene Ok, MD;  Location: [place];  Service: General;  Laterality: N/A;   PORTACATH PLACEMENT Right 03/07/2018   Procedure: INSERTION PORT-A-CATH RIGHT SUBCLAVIAN;  Surgeon: [name], MD;  Location: [place];  Service: General;  Laterality: Right;   PROCTOSCOPY N/A 06/12/2018   Procedure: RIGID PROCTOSCOPY;  Surgeon: [name], MD;  [facility];  Service: General;  Laterality: N/A;   thumb surgery  2018   cyst removal    Family History  Problem Relation Age of Onset   Breast cancer Mother 58       metastatin; recurrence x7   Heart attack Father 54   Cancer Daughter        melanoma, leukemia   Esophageal cancer Neg Hx    Colon cancer Neg Hx    Rectal cancer Neg Hx    Ulcerative colitis Neg Hx     Social History   Tobacco Use   Smoking status: Never   Smokeless tobacco: Former    Types: Chew    Quit date: 10/22/2019  Vaping Use   Vaping Use: Never used  Substance Use Topics   Alcohol use: Yes    Alcohol/week: 2.0 standard drinks    Types: 1 Cans of beer, 1  Shots of liquor per week    Comment: sometimes   Drug use: Never    Prior to Admission medications   Medication Sig Start Date End Date Taking? Authorizing Provider  belladonna-opium (B&O SUPPRETTES) 16.2-30 MG suppository Place 1 suppository rectally every 8 (eight) hours as needed (For spasm; may cause constipation). 04/09/21  Yes Lilly Gasser, MD  ciprofloxacin (CIPRO) 500 MG tablet Take 1 tablet (500 mg total) by mouth 2 (two) times daily. One po bid x 7 days 04/09/21  Yes Lakevia Perris, MD  apixaban (ELIQUIS) 5 MG TABS tablet Take 1 tablet (5 mg total) by mouth 2 (two) times daily. 01/17/18   Georgette Shell, MD  b complex vitamins tablet Take 1 tablet by mouth daily.    [provider]  capecitabine (XELODA) 500 MG tablet TAKE 3 TABLETS BY MOUTH  EVERY 12 HOURS WITHIN 30  MINUTES OF A MEAL FOR 14  DAYS ON THEN 7 DAYS OFF 03/14/21   [name], MD  diltiazem (CARDIZEM CD) 360 MG 24 hr capsule Take 1 capsule (360 mg total) by mouth daily. 05/06/20   Jerline Pain, MD  levothyroxine (SYNTHROID) 75 MCG tablet TAKE 1 TABLET(75 MCG) BY MOUTH DAILY Patient taking differently: Take 75 mcg by mouth daily. 12/14/20   [name], MD  lidocaine-prilocaine (EMLA) cream Apply 1 application topically as needed. Patient taking differently: Apply 1 application topically as needed (port site). 07/14/20   [name], MD  Multiple Vitamins-Iron (MULTIVITAMIN/IRON PO) Take 1 tablet by mouth daily.     [provider]  prochlorperazine (COMPAZINE) 10 MG tablet Take 1 tablet (10 mg total) by mouth every 6 (six) hours as needed for nausea or vomiting. 01/28/20   [name], MD  tamsulosin (FLOMAX) 0.4 MG CAPS capsule Take 0.4 mg by mouth 2 (two) times daily.     [provider]    Allergies Patient has no known allergies.   REVIEW OF SYSTEMS  Negative except as noted here or in the History of Present Illness.   PHYSICAL EXAMINATION  Initial Vital Signs Blood pressure (!)  132/91, pulse 95, temperature 98.6 F (37 C), temperature source Oral, resp. rate (!) 22, height 5\' 11"  (1.803 m), weight 93 kg, SpO2 97 %.  Examination General: Well-developed, well-nourished male in no acute distress; appearance consistent with age of record HENT: normocephalic; atraumatic Eyes: Normal appearance Neck: supple Heart: regular rate and rhythm Lungs: clear to auscultation bilaterally Abdomen: soft; nondistended; suprapubic tenderness bowel sounds present GU: No CVA tenderness Extremities: No deformity; full range of motion; pulses normal Neurologic: Awake, alert and oriented; motor function intact in all extremities and symmetric; no  facial droop Skin: Warm and dry Psychiatric: Normal mood and affect   RESULTS  Summary of this visit's results, reviewed and interpreted by myself:   EKG Interpretation  Date/Time:    Ventricular Rate:    PR Interval:    QRS Duration:   QT Interval:    QTC Calculation:   R Axis:     Text Interpretation:         Laboratory Studies: Results for orders placed or performed during the hospital encounter of 04/09/21 (from the past 24 hour(s))  Urinalysis, Routine w reflex microscopic Urine, Clean Catch     Status: Abnormal   Collection Time: 04/09/21  2:58 AM  Result Value Ref Range   Color, Urine YELLOW YELLOW   APPearance CLEAR CLEAR   Specific Gravity, Urine 1.015 1.005 - 1.030   pH 6.0 5.0 - 8.0   Glucose, UA NEGATIVE NEGATIVE mg/dL   Hgb urine dipstick SMALL (A) NEGATIVE   Bilirubin Urine NEGATIVE NEGATIVE   Ketones, ur NEGATIVE NEGATIVE mg/dL   Protein, ur 30 (A) NEGATIVE mg/dL   Nitrite NEGATIVE NEGATIVE   Leukocytes,Ua MODERATE (A) NEGATIVE   RBC / HPF 0-5 0 - 5 RBC/hpf   WBC, UA >50 (H) 0 - 5 WBC/hpf   Squamous Epithelial / LPF 0-5 0 - 5   Mucus PRESENT    Imaging Studies: No results found.  ED COURSE and MDM  Nursing notes, initial and subsequent vitals signs, including pulse oximetry, reviewed and  interpreted by myself.  Vitals:   04/09/21 0239 04/09/21 0240 04/09/21 0430  BP: (!) 132/91  (!) 163/101  Pulse: 95  79  Resp: (!) 22    Temp: 98.6 F (37 C)    TempSrc: Oral    SpO2: 97%  95%  Weight:  93 kg   Height:  5\' 11"  (1.803 m)    Medications  hyoscyamine (LEVSIN SL) SL tablet 0.25 mg (0.25 mg Sublingual Given 04/09/21 0326)  ciprofloxacin (CIPRO) tablet 500 mg (500 mg Oral Given 04/09/21 0412)   4:34 AM No relief with hyoscyamine.  Patient's bladder has only 172 mL of urine and I do not believe a Foley catheter is indicated, especially in the context of an infection.  His infection appears worse than previously and I suspect he has some organism resistant to Keflex.  Unfortunately no culture was done.  Urine today was sent for culture and we will switch him to ciprofloxacin.  I suspect his pain is due to bladder spasms and we will start him on B&O suppositories.   PROCEDURES  Procedures   ED DIAGNOSES     ICD-10-CM   1. Lower urinary tract infection  N39.0     2. Bladder spasms  N32.89          Shanon Rosser, MD 04/09/21 [phone]

## 2021-04-09 NOTE — ED Triage Notes (Addendum)
Patient here POV from Home with Urinary Retention.  Patient states he was seen recently for UTI and discharged with PO Antibiotics. Patient Symptoms seem to have gotten better but this PM the patient had difficulty urinating and is now having constant Bladder Pain.  Patient also has History of Cancer and is currently on Chemotherapy. Fever 100.7 this PM.  Ambulatory. A&Ox4. GCS 15. Patient in Obvious Discomfort.

## 2021-04-10 ENCOUNTER — Telehealth: Payer: Self-pay | Admitting: Hematology

## 2021-04-10 LAB — URINE CULTURE: Culture: NO GROWTH

## 2021-04-10 NOTE — Telephone Encounter (Signed)
Cancelled per 11/4 pt request, pt said they will call back to r/s

## 2021-04-13 ENCOUNTER — Inpatient Hospital Stay: Payer: Commercial Managed Care - PPO

## 2021-04-15 ENCOUNTER — Encounter: Payer: Self-pay | Admitting: Oncology

## 2021-04-15 ENCOUNTER — Encounter: Payer: Self-pay | Admitting: Hematology

## 2021-04-15 NOTE — Telephone Encounter (Signed)
Chart Review only.  No changes made.

## 2021-04-16 DIAGNOSIS — Z8719 Personal history of other diseases of the digestive system: Secondary | ICD-10-CM | POA: Diagnosis not present

## 2021-04-16 DIAGNOSIS — Z9889 Other specified postprocedural states: Secondary | ICD-10-CM | POA: Diagnosis not present

## 2021-04-17 DIAGNOSIS — N401 Enlarged prostate with lower urinary tract symptoms: Secondary | ICD-10-CM | POA: Diagnosis not present

## 2021-04-17 DIAGNOSIS — R338 Other retention of urine: Secondary | ICD-10-CM | POA: Diagnosis not present

## 2021-04-18 ENCOUNTER — Other Ambulatory Visit: Payer: Self-pay | Admitting: Cardiology

## 2021-04-24 DIAGNOSIS — R109 Unspecified abdominal pain: Secondary | ICD-10-CM | POA: Diagnosis not present

## 2021-05-03 ENCOUNTER — Encounter (HOSPITAL_COMMUNITY): Payer: Self-pay | Admitting: General Surgery

## 2021-05-03 NOTE — Progress Notes (Signed)
PCP - Lorenda Ishihara @ [place] Cardiologist - Mark skains EKG - 05/09/20 Chest x-ray - na ECHO - 01/12/18 Cardiac Cath - na CPAP - na   Blood Thinner Instructions: last dose 04/30/21   ERAS Protcol - yes  COVID TEST- na  Anesthesia review: na  -------------  SDW INSTRUCTIONS:  Your procedure is scheduled on 11/29. Please report to [facility] Main Entrance "A" at 1330 A.M., and check in at the Admitting office. Call this number if you have problems the morning of surgery: [phone]   Remember: Do not eat  after midnight the night before your surgery  You may drink clear liquids until 1300 the morning of your surgery.   Clear liquids allowed are: Water, Non-Citrus Juices (without pulp), Carbonated Beverages, Clear Tea, Black Coffee Only, and Gatorade   Medications to take morning of surgery with a sip of water include: Levothyroxine,tamsulosin, compazine-prn  As of today, STOP taking any Aspirin (unless otherwise instructed by your surgeon), Aleve, Naproxen, Ibuprofen, Motrin, Advil, Goody's, BC's, all herbal medications, fish oil, and all vitamins.    The Morning of Surgery Do not wear jewelry. Do not wear lotions, powders, or perfumes/colognes, or deodorant Do not shave 48 hours prior to surgery.   Men may shave face and neck. Do not bring valuables to the hospital. [facility] is not responsible for any belongings or valuables.  If you are a smoker, DO NOT Smoke 24 hours prior to surgery  If you wear a CPAP at night please bring your mask the morning of surgery   Remember that you must have someone to transport you home after your surgery, and remain with you for 24 hours if you are discharged the same day.  Please bring cases for contacts, glasses, hearing aids, dentures or bridgework because it cannot be worn into surgery.   Patients discharged the day of surgery will not be allowed to drive home.   Please shower the NIGHT BEFORE/MORNING OF SURGERY (use  antibacterial soap like DIAL soap if possible). Wear comfortable clothes the morning of surgery. Oral Hygiene is also important to reduce your risk of infection.  Remember - BRUSH YOUR TEETH THE MORNING OF SURGERY WITH YOUR REGULAR TOOTHPASTE  Patient denies shortness of breath, fever, cough and chest pain.

## 2021-05-04 ENCOUNTER — Ambulatory Visit: Payer: Self-pay | Admitting: General Surgery

## 2021-05-04 ENCOUNTER — Telehealth: Payer: Self-pay

## 2021-05-04 NOTE — Telephone Encounter (Signed)
Pt called stating he's having surgery tomorrow but he's still scheduled for lab, to see Dr. Burr Medico, and infusion tomorrow (05/05/2021).  Pt stated he didn't think that Dr. Burr Medico wanted him to receive tx nor see him on 05/05/2021 since he will be having surgery at 2pm on 05/05/2021.  Sent message to Dr. Burr Medico to confirm that she wants the pt's appts tomorrow rescheduled.

## 2021-05-04 NOTE — H&P (Addendum)
Chief Complaint: Post Operative Visit   History of Present Illness: Dave Vaughn is a 75 y.o. male who is seen today for abdominal pain. Patient continued with abdominal pain after the last weeks injection with local. He states that there have been no change of his abdominal pain. He still states that it sharp and over the area of the previous ostomy site.  Patient is continue with his gabapentin.  Review of Systems: A complete review of systems was obtained from the patient. I have reviewed this information and discussed as appropriate with the patient. See HPI as well for other ROS.  Review of Systems  Constitutional: Negative for fever.  HENT: Negative for congestion.  Eyes: Negative for blurred vision.  Respiratory: Negative for cough, shortness of breath and wheezing.  Cardiovascular: Negative for chest pain and palpitations.  Gastrointestinal: Negative for heartburn.  Genitourinary: Negative for dysuria.  Musculoskeletal: Negative for myalgias.  Skin: Negative for rash.  Neurological: Negative for dizziness and headaches.  Psychiatric/Behavioral: Negative for depression and suicidal ideas.  All other systems reviewed and are negative.   Medical History: Past Medical History:  Diagnosis Date   History of cancer   Patient Active Problem List  Diagnosis   Cancer of left colon (CMS-HCC)   Chronic anticoagulation   Chronic atrial fibrillation (CMS-HCC)   CKD (chronic kidney disease) stage 3, GFR 30-59 ml/min (CMS-HCC)   Goals of care, counseling/discussion   Hypomagnesemia   Hypothyroid, unspecified   Parastomal hernia without obstruction or gangrene   Port-A-Cath in place   S/P hernia repair   Past Surgical History:  Procedure Laterality Date   COLON SURGERY   HERNIA REPAIR    No Known Allergies  Current Outpatient Medications on File Prior to Visit  Medication Sig Dispense Refill   b complex vitamins tablet Take 1 tablet by mouth once daily    capecitabine (XELODA) 500 MG tablet   diazePAM (VALIUM) 5 MG tablet Take 1 tablet (5 mg total) by mouth every 6 (six) hours as needed for Anxiety for up to 15 days 30 tablet 0   diltiazem (TIAZAC) 360 MG ER capsule Take by mouth   ELIQUIS 5 mg tablet Take 5 mg by mouth 2 (two) times daily   gabapentin (NEURONTIN) 300 MG capsule Take 2 capsules (600 mg total) by mouth 3 (three) times daily 180 capsule 11   levothyroxine (SYNTHROID) 75 MCG tablet   multivitamin tablet Take 1 tablet by mouth once daily   tamsulosin (FLOMAX) 0.4 mg capsule Take 0.4 mg by mouth 2 (two) times daily   gabapentin (NEURONTIN) 300 MG capsule Take 1 capsule (300 mg total) by mouth 3 (three) times daily (Patient not taking: Reported on 04/24/2021) 90 capsule 11   No current facility-administered medications on file prior to visit.   Family History  Problem Relation Age of Onset   Breast cancer Mother   Hyperlipidemia (Elevated cholesterol) Father    Social History   Tobacco Use  Smoking Status Never  Smokeless Tobacco Never    Social History   Socioeconomic History   Marital status: Married  Tobacco Use   Smoking status: Never   Smokeless tobacco: Never  Vaping Use   Vaping Use: Never used  Substance and Sexual Activity   Alcohol use: Defer   Drug use: Defer   Sexual activity: Defer   Objective:   There were no vitals filed for this visit.  There is no height or weight on file to calculate BMI.  Physical  Exam Constitutional:  General: He is not in acute distress. Appearance: Normal appearance.  HENT:  Head: Normocephalic.  Nose: No rhinorrhea.  Mouth/Throat:  Mouth: Mucous membranes are moist.  Pharynx: Oropharynx is clear.  Eyes:  General: No scleral icterus. Pupils: Pupils are equal, round, and reactive to light.  Cardiovascular:  Rate and Rhythm: Normal rate.  Pulses: Normal pulses.  Pulmonary:  Effort: Pulmonary effort is normal. No respiratory distress.  Breath sounds: No  stridor. No wheezing.  Abdominal:  General: Abdomen is flat. There is no distension.  Tenderness: There is no abdominal tenderness. There is no guarding or rebound.  Musculoskeletal:  General: Normal range of motion.  Cervical back: Normal range of motion and neck supple.  Skin: General: Skin is warm and dry.  Capillary Refill: Capillary refill takes less than 2 seconds.  Coloration: Skin is not jaundiced.  Neurological:  General: No focal deficit present.  Mental Status: He is alert and oriented to person, place, and time. Mental status is at baseline.  Psychiatric:  Mood and Affect: Mood normal.  Thought Content: Thought content normal.  Judgment: Judgment normal.     Assessment and Plan:  Diagnoses and all orders for this visit:  Abdominal wall pain, unspecified    1. Failure of resolution of abdominal pain after injection of kenelog. 2. Will proceed to the OR for exploration and excision of scar tissue. 3. I d/w the patient the risks and benefits of the procedure to include but not limited: continued pain, need for further surgery, infection, bleeding, and damage to surrounding structures.  Patient voiced understanding and wishes to proceed.  No follow-ups on file.  Ralene Ok, MD

## 2021-05-05 ENCOUNTER — Inpatient Hospital Stay: Payer: Commercial Managed Care - PPO | Admitting: Hematology

## 2021-05-05 ENCOUNTER — Ambulatory Visit (HOSPITAL_COMMUNITY)
Admission: RE | Admit: 2021-05-05 | Discharge: 2021-05-05 | Disposition: A | Discharge status: Home / Self Care | Payer: Commercial Managed Care - PPO | Attending: General Surgery | Admitting: General Surgery

## 2021-05-05 ENCOUNTER — Inpatient Hospital Stay: Payer: Commercial Managed Care - PPO

## 2021-05-05 ENCOUNTER — Encounter (HOSPITAL_COMMUNITY): Payer: Self-pay | Admitting: General Surgery

## 2021-05-05 ENCOUNTER — Ambulatory Visit (HOSPITAL_COMMUNITY): Payer: Commercial Managed Care - PPO | Admitting: Anesthesiology

## 2021-05-05 ENCOUNTER — Encounter (HOSPITAL_COMMUNITY)
Admission: RE | Disposition: A | Discharge status: Home / Self Care | Payer: Self-pay | Source: Home / Self Care | Attending: General Surgery

## 2021-05-05 ENCOUNTER — Other Ambulatory Visit: Payer: Self-pay

## 2021-05-05 DIAGNOSIS — Z79899 Other long term (current) drug therapy: Secondary | ICD-10-CM | POA: Insufficient documentation

## 2021-05-05 DIAGNOSIS — I129 Hypertensive chronic kidney disease with stage 1 through stage 4 chronic kidney disease, or unspecified chronic kidney disease: Secondary | ICD-10-CM | POA: Insufficient documentation

## 2021-05-05 DIAGNOSIS — N183 Chronic kidney disease, stage 3 unspecified: Secondary | ICD-10-CM | POA: Diagnosis not present

## 2021-05-05 DIAGNOSIS — C44599 Other specified malignant neoplasm of skin of other part of trunk: Secondary | ICD-10-CM | POA: Insufficient documentation

## 2021-05-05 DIAGNOSIS — D631 Anemia in chronic kidney disease: Secondary | ICD-10-CM | POA: Diagnosis not present

## 2021-05-05 DIAGNOSIS — Z85038 Personal history of other malignant neoplasm of large intestine: Secondary | ICD-10-CM | POA: Diagnosis not present

## 2021-05-05 DIAGNOSIS — R109 Unspecified abdominal pain: Secondary | ICD-10-CM | POA: Diagnosis present

## 2021-05-05 DIAGNOSIS — C186 Malignant neoplasm of descending colon: Secondary | ICD-10-CM | POA: Diagnosis not present

## 2021-05-05 HISTORY — PX: EXCISION OF KELOID: SHX6267

## 2021-05-05 HISTORY — DX: Presence of other specified devices: Z97.8

## 2021-05-05 LAB — CBC
HCT: 44.6 % (ref 39.0–52.0)
Hemoglobin: 14.7 g/dL (ref 13.0–17.0)
MCH: 31.9 pg (ref 26.0–34.0)
MCHC: 33 g/dL (ref 30.0–36.0)
MCV: 96.7 fL (ref 80.0–100.0)
Platelets: 169 10*3/uL (ref 150–400)
RBC: 4.61 MIL/uL (ref 4.22–5.81)
RDW: 13.6 % (ref 11.5–15.5)
WBC: 8 10*3/uL (ref 4.0–10.5)
nRBC: 0 % (ref 0.0–0.2)

## 2021-05-05 LAB — BASIC METABOLIC PANEL
Anion gap: 7 (ref 5–15)
BUN: 18 mg/dL (ref 8–23)
CO2: 26 mmol/L (ref 22–32)
Calcium: 9 mg/dL (ref 8.9–10.3)
Chloride: 104 mmol/L (ref 98–111)
Creatinine, Ser: 1.16 mg/dL (ref 0.61–1.24)
GFR, Estimated: >60 mL/min (ref >60)
Glucose, Bld: 100 mg/dL — ABNORMAL HIGH (ref 70–99)
Potassium: 4.1 mmol/L (ref 3.5–5.1)
Sodium: 137 mmol/L (ref 135–145)

## 2021-05-05 SURGERY — EXCISION, KELOID
Anesthesia: Regional | Site: Abdomen

## 2021-05-05 MED ORDER — CHLORHEXIDINE GLUCONATE 0.12 % MT SOLN
15.0000 mL | Freq: Once | OROMUCOSAL | Status: AC
  Administered 2021-05-05: 15 mL via OROMUCOSAL

## 2021-05-05 MED ORDER — CHLORHEXIDINE GLUCONATE 0.12 % MT SOLN
OROMUCOSAL | Status: AC
  Filled 2021-05-05: qty 15

## 2021-05-05 MED ORDER — FENTANYL CITRATE (PF) 100 MCG/2ML IJ SOLN
25.0000 ug | INTRAMUSCULAR | Status: DC | PRN
  Administered 2021-05-05: 50 ug via INTRAVENOUS

## 2021-05-05 MED ORDER — ROPIVACAINE HCL 5 MG/ML IJ SOLN
INTRAMUSCULAR | Status: DC | PRN
  Administered 2021-05-05: 30 mL

## 2021-05-05 MED ORDER — OXYCODONE HCL 5 MG PO TABS: 5.0000 mg | ORAL_TABLET | Freq: Once | ORAL | Status: DC | PRN

## 2021-05-05 MED ORDER — ACETAMINOPHEN 500 MG PO TABS
1000.0000 mg | ORAL_TABLET | ORAL | Status: AC
  Administered 2021-05-05: 1000 mg via ORAL

## 2021-05-05 MED ORDER — FENTANYL CITRATE (PF) 100 MCG/2ML IJ SOLN
INTRAMUSCULAR | Status: DC | PRN
  Administered 2021-05-05: 25 ug via INTRAVENOUS
  Administered 2021-05-05: 50 ug via INTRAVENOUS

## 2021-05-05 MED ORDER — ENSURE PRE-SURGERY PO LIQD: 296.0000 mL | Freq: Once | ORAL | Status: DC

## 2021-05-05 MED ORDER — FENTANYL CITRATE (PF) 100 MCG/2ML IJ SOLN
INTRAMUSCULAR | Status: AC
  Filled 2021-05-05: qty 2

## 2021-05-05 MED ORDER — 0.9 % SODIUM CHLORIDE (POUR BTL) OPTIME
TOPICAL | Status: DC | PRN
  Administered 2021-05-05: 1000 mL

## 2021-05-05 MED ORDER — CHLORHEXIDINE GLUCONATE CLOTH 2 % EX PADS: 6.0000 | MEDICATED_PAD | Freq: Once | CUTANEOUS | Status: DC

## 2021-05-05 MED ORDER — OXYCODONE HCL 5 MG/5ML PO SOLN: 5.0000 mg | Freq: Once | ORAL | Status: DC | PRN

## 2021-05-05 MED ORDER — FENTANYL CITRATE (PF) 250 MCG/5ML IJ SOLN
INTRAMUSCULAR | Status: AC
  Filled 2021-05-05: qty 5

## 2021-05-05 MED ORDER — FENTANYL CITRATE (PF) 100 MCG/2ML IJ SOLN
100.0000 ug | Freq: Once | INTRAMUSCULAR | Status: AC
  Administered 2021-05-05: 50 ug via INTRAVENOUS

## 2021-05-05 MED ORDER — OXYCODONE-ACETAMINOPHEN 5-325 MG PO TABS: 1.0000 | ORAL_TABLET | ORAL | 0 refills | Status: DC | PRN

## 2021-05-05 MED ORDER — LACTATED RINGERS IV SOLN: INTRAVENOUS | Status: DC

## 2021-05-05 MED ORDER — CEFAZOLIN SODIUM-DEXTROSE 2-4 GM/100ML-% IV SOLN
2.0000 g | INTRAVENOUS | Status: AC
  Administered 2021-05-05: 2 g via INTRAVENOUS

## 2021-05-05 MED ORDER — MIDAZOLAM HCL 2 MG/2ML IJ SOLN
2.0000 mg | Freq: Once | INTRAMUSCULAR | Status: AC
  Administered 2021-05-05: 2 mg via INTRAVENOUS

## 2021-05-05 MED ORDER — ONDANSETRON HCL 4 MG/2ML IJ SOLN
INTRAMUSCULAR | Status: DC | PRN
  Administered 2021-05-05: 4 mg via INTRAVENOUS

## 2021-05-05 MED ORDER — MIDAZOLAM HCL 2 MG/2ML IJ SOLN
INTRAMUSCULAR | Status: AC
  Filled 2021-05-05: qty 2

## 2021-05-05 MED ORDER — ACETAMINOPHEN 500 MG PO TABS
ORAL_TABLET | ORAL | Status: AC
  Filled 2021-05-05: qty 2

## 2021-05-05 MED ORDER — SODIUM CHLORIDE 0.9 % IV SOLN
INTRAVENOUS | Status: DC | PRN
  Administered 2021-05-05: 40 mL

## 2021-05-05 MED ORDER — ORAL CARE MOUTH RINSE: 15.0000 mL | Freq: Once | OROMUCOSAL | Status: AC

## 2021-05-05 MED ORDER — CEFAZOLIN SODIUM-DEXTROSE 2-4 GM/100ML-% IV SOLN
INTRAVENOUS | Status: AC
  Filled 2021-05-05: qty 100

## 2021-05-05 MED ORDER — BUPIVACAINE LIPOSOME 1.3 % IJ SUSP
INTRAMUSCULAR | Status: AC
  Filled 2021-05-05: qty 20

## 2021-05-05 MED ORDER — PROPOFOL 10 MG/ML IV BOLUS
INTRAVENOUS | Status: DC | PRN
  Administered 2021-05-05: 180 mg via INTRAVENOUS

## 2021-05-05 MED ORDER — DEXAMETHASONE SODIUM PHOSPHATE 4 MG/ML IJ SOLN
INTRAMUSCULAR | Status: DC | PRN
  Administered 2021-05-05: 10 mg via INTRAVENOUS

## 2021-05-05 MED ORDER — LIDOCAINE 2% (20 MG/ML) 5 ML SYRINGE
INTRAMUSCULAR | Status: DC | PRN
  Administered 2021-05-05: 80 mg via INTRAVENOUS

## 2021-05-05 SURGICAL SUPPLY — 36 items
ADH SKN CLS APL DERMABOND .7 (GAUZE/BANDAGES/DRESSINGS) ×1
APL PRP STRL LF DISP 70% ISPRP (MISCELLANEOUS) ×1
BAG COUNTER SPONGE SURGICOUNT (BAG) ×2 IMPLANT
BAG SPNG CNTER NS LX DISP (BAG) ×1
CANISTER SUCT 3000ML PPV (MISCELLANEOUS) IMPLANT
CHLORAPREP W/TINT 26 (MISCELLANEOUS) ×2 IMPLANT
COVER SURGICAL LIGHT HANDLE (MISCELLANEOUS) ×2 IMPLANT
DECANTER SPIKE VIAL GLASS SM (MISCELLANEOUS) ×2 IMPLANT
DERMABOND ADVANCED (GAUZE/BANDAGES/DRESSINGS) ×1
DERMABOND ADVANCED .7 DNX12 (GAUZE/BANDAGES/DRESSINGS) ×1 IMPLANT
DRAPE LAPAROTOMY 100X72 PEDS (DRAPES) ×2 IMPLANT
ELECT REM PT RETURN 9FT ADLT (ELECTROSURGICAL) ×2
ELECTRODE REM PT RTRN 9FT ADLT (ELECTROSURGICAL) ×1 IMPLANT
GLOVE SRG 8 PF TXTR STRL LF DI (GLOVE) ×1 IMPLANT
GLOVE SURG ENC MOIS LTX SZ7.5 (GLOVE) ×2 IMPLANT
GLOVE SURG UNDER POLY LF SZ8 (GLOVE) ×2
GOWN STRL REUS W/ TWL LRG LVL3 (GOWN DISPOSABLE) ×1 IMPLANT
GOWN STRL REUS W/ TWL XL LVL3 (GOWN DISPOSABLE) ×1 IMPLANT
GOWN STRL REUS W/TWL LRG LVL3 (GOWN DISPOSABLE) ×2
GOWN STRL REUS W/TWL XL LVL3 (GOWN DISPOSABLE) ×2
KIT BASIN OR (CUSTOM PROCEDURE TRAY) ×2 IMPLANT
KIT TURNOVER KIT B (KITS) ×2 IMPLANT
NDL HYPO 25GX1X1/2 BEV (NEEDLE) ×1 IMPLANT
NEEDLE HYPO 25GX1X1/2 BEV (NEEDLE) ×2 IMPLANT
NS IRRIG 1000ML POUR BTL (IV SOLUTION) ×2 IMPLANT
PACK GENERAL/GYN (CUSTOM PROCEDURE TRAY) ×2 IMPLANT
PAD ARMBOARD 7.5X6 YLW CONV (MISCELLANEOUS) ×2 IMPLANT
PENCIL SMOKE EVACUATOR (MISCELLANEOUS) ×1 IMPLANT
SUT MNCRL AB 4-0 PS2 18 (SUTURE) ×2 IMPLANT
SUT PDS AB 2-0 CT2 27 (SUTURE) ×3 IMPLANT
SUT VIC AB 3-0 SH 27 (SUTURE) ×2
SUT VIC AB 3-0 SH 27XBRD (SUTURE) ×1 IMPLANT
SUT VIC AB 3-0 SH 8-18 (SUTURE) ×1 IMPLANT
SYR CONTROL 10ML LL (SYRINGE) ×2 IMPLANT
TOWEL GREEN STERILE (TOWEL DISPOSABLE) ×2 IMPLANT
TOWEL GREEN STERILE FF (TOWEL DISPOSABLE) ×2 IMPLANT

## 2021-05-05 NOTE — Op Note (Signed)
05/05/2021  5:09 PM  PATIENT:  Dave Vaughn  75 y.o. male  PRE-OPERATIVE DIAGNOSIS:  abdominal wall pain  POST-OPERATIVE DIAGNOSIS:  abdominal wall pain  PROCEDURE:  Procedure(s): ABDOMINAL SCAR EXCISION (N/A) and closure of anterior rectus fascia  SURGEON:  Surgeon(s) and Role:    Ralene Ok, MD - Primary  ASSISTANTS: Pryor Curia, RNFA  ANESTHESIA:   local, regional, and general  EBL:  20 mL   BLOOD ADMINISTERED:none  DRAINS: none   LOCAL MEDICATIONS USED:  OTHER Exparel  SPECIMEN:  Source of Specimen: Left lower quadrant abdominal wall scar  DISPOSITION OF SPECIMEN:  PATHOLOGY  COUNTS:  YES  TOURNIQUET:  * No tourniquets in log *  DICTATION: .Dragon Dictation  Indication procedure: Patient is a 75 year old male who previously underwent incisional hernia repair with mesh.  Patient did well subsequently.  Patient had long-term follow-up in clinic Conservative measures secondary to sharp stabbing pain over the previous ostomy site.  Patient failed local anesthetic, Kenalog injection, and patient was counseled.  Patient decided proceed to the operating for local scar excision.  Findings: Patient had approximately a 5 x 7 cm piece of calcified scar tissue over the rectus fascia.  This was excised in its entirety.  This did appear to penetrate some of the mesh that was there.  The peritoneum was not violated.  There was an area that was thin and was reapproximated using figure-of-eight 2-0 PDS x4.  The anterior rectus fascia was mobilized some and reapproximated using running 2-0 PDS.  It appears that the calcification likely was due to a calcified hematoma as there was a large space above the anterior rectus fascia and the scar tissue that was excised.  Details of procedure: After the patient was consented the patient was consented he was taken back to the OR and placed in supine position with bilateral SCDs in place.  He underwent general endotracheal  intubation.  Patient was in prepped and draped in standard fashion.  A timeout was called and all facts verified.  At this time elliptical incision was made over the previous left lower quadrant ostomy scar.  Cautery was used to maintain the stasis and dissection of the subcutaneous fat was undertaken.  This was excised.  At this time this allowed making into a pocket just above the fascial layer.  It was evident there was a very calcified portion of scar tissue over the rectus fascia.  At this time I proceeded to dissect this off the anterior abdominal wall using cautery.  Most of the calcification came out in its entirety.  A portion medially did appear to go down to the peritoneum past the mesh.  There was some mesh fibers that were seen.  The peritoneum was not violated.  Once this was excised this was sent off to pathology.  At this time the area of thin peritoneum was reapproximated using 2-0 PDS in figure-of-eight fashion x4.  This allowed the muscle in lateral scar tissue to reapproximate and cover the area imbricating it.  At this time I mobilized the anterior rectus fascia for the muscle and subcutaneous tissue.  This was done circumferentially.  At this time this allowed me to reapproximate the fascia in a cephalad to caudad direction.  This was done using running 2-0 PDS in a standard running fashion.  At this time Exparel was then used to infiltrate the subfascial layer.  2-0 Vicryl's were used in interrupted fashion to reapproximate the deep dermal layer.  Skin was  reapproximated using 4 Monocryl in subcuticular fashion.  The skin was dressed Dermabond.  Patient taught the procedure well was taken to the recovery in stable condition.    PLAN OF CARE: Discharge to home after PACU  PATIENT DISPOSITION:  PACU - hemodynamically stable.   Delay start of Pharmacological VTE agent (>24hrs) due to surgical blood loss or risk of bleeding: not applicable

## 2021-05-05 NOTE — Anesthesia Postprocedure Evaluation (Signed)
Anesthesia Post Note  Patient: Dave Vaughn  Procedure(s) Performed: ABDOMINAL SCAR EXCISION (Abdomen)     Patient location during evaluation: PACU Anesthesia Type: Regional and General Level of consciousness: awake and alert, oriented and patient cooperative Pain management: pain level controlled Vital Signs Assessment: post-procedure vital signs reviewed and stable Respiratory status: spontaneous breathing, nonlabored ventilation and respiratory function stable Cardiovascular status: blood pressure returned to baseline and stable Postop Assessment: no apparent nausea or vomiting Anesthetic complications: no   No notable events documented.  Last Vitals:  Vitals:   05/05/21 1730 05/05/21 1745  BP: (!) 142/94 (!) 141/108  Pulse: (!) 105 76  Resp: 13 15  Temp: 36.5 C   SpO2: 98% 94%    Last Pain:  Vitals:   05/05/21 1739  TempSrc:   PainSc: [place]

## 2021-05-05 NOTE — Interval H&P Note (Signed)
History and Physical Interval Note:  05/05/2021 3:08 PM  Dave Vaughn  has presented today for surgery, with the diagnosis of abdominal wall pain.  The various methods of treatment have been discussed with the patient and family. After consideration of risks, benefits and other options for treatment, the patient has consented to  Procedure(s): SCAR EXCISION (N/A) as a surgical intervention.  The patient's history has been reviewed, patient examined, no change in status, stable for surgery.  I have reviewed the patient's chart and labs.  Questions were answered to the patient's satisfaction.     Ralene Ok

## 2021-05-05 NOTE — Anesthesia Procedure Notes (Signed)
Anesthesia Regional Block: TAP block   Pre-Anesthetic Checklist: , timeout performed,  Correct Patient, Correct Site, Correct Laterality,  Correct Procedure, Correct Position, site marked,  Risks and benefits discussed,  Surgical consent,  Pre-op evaluation,  At surgeon's request and post-op pain management  Laterality: Left  Prep: Dura Prep       Needles:  Injection technique: Single-shot  Needle Type: Echogenic Stimulator Needle     Needle Length: 10cm  Needle Gauge: 20     Additional Needles:   Procedures:,,,, ultrasound used (permanent image in chart),,    Narrative:  Start time: 05/05/2021 3:03 PM End time: 05/05/2021 3:08 PM Injection made incrementally with aspirations every 5 mL.  Performed by: Personally  Anesthesiologist: Darral Dash, DO  Additional Notes: Patient identified. Risks/Benefits/Options discussed with patient including but not limited to bleeding, infection, nerve damage, failed block, incomplete pain control. Patient expressed understanding and wished to proceed. All questions were answered. Sterile technique was used throughout the entire procedure. Please see nursing notes for vital signs. Aspirated in 5cc intervals with injection for negative confirmation. Patient was given instructions on fall risk and not to get out of bed. All questions and concerns addressed with instructions to call with any issues or inadequate analgesia.

## 2021-05-05 NOTE — Transfer of Care (Signed)
Immediate Anesthesia Transfer of Care Note  Patient: Dave Vaughn  Procedure(s) Performed: ABDOMINAL SCAR EXCISION (Abdomen)  Patient Location: PACU  Anesthesia Type:General and Regional  Level of Consciousness: awake, alert  and oriented  Airway & Oxygen Therapy: Patient Spontanous Breathing  Post-op Assessment: Report given to RN and Post -op Vital signs reviewed and stable  Post vital signs: Reviewed and stable  Last Vitals:  Vitals Value Taken Time  BP 142/94 05/05/21 1728  Temp    Pulse 87 05/05/21 1731  Resp 22 05/05/21 1731  SpO2 84 % 05/05/21 1731  Vitals shown include unvalidated device data.  Last Pain:  Vitals:   05/05/21 1530  TempSrc:   PainSc: 0-No pain      Patients Stated Pain Goal: 3 (11/46/43 1427)  Complications: No notable events documented.

## 2021-05-05 NOTE — Anesthesia Preprocedure Evaluation (Signed)
Anesthesia Evaluation  Patient identified by MRN, date of birth, ID band Patient awake    Airway Mallampati: II  TM Distance: >3 FB Neck ROM: Limited    Dental no notable dental hx.    Pulmonary neg pulmonary ROS,    Pulmonary exam normal        Cardiovascular hypertension, Pt. on medications + dysrhythmias (on Eliquis) Atrial Fibrillation  Rhythm:Irregular Rate:Normal     Neuro/Psych Anxiety Depression negative neurological ROS     GI/Hepatic Neg liver ROS, Abdominal wall pain   Endo/Other  Hypothyroidism   Renal/GU   negative genitourinary   Musculoskeletal negative musculoskeletal ROS (+)   Abdominal Normal abdominal exam  (+)   Peds  Hematology  (+) anemia ,   Anesthesia Other Findings   Reproductive/Obstetrics                            Anesthesia Physical Anesthesia Plan  ASA: 3  Anesthesia Plan: General and Regional   Post-op Pain Management: Regional block   Induction: Intravenous  PONV Risk Score and Plan: 2 and Ondansetron, Dexamethasone and Treatment may vary due to age or medical condition  Airway Management Planned: Mask and LMA  Additional Equipment: None  Intra-op Plan:   Post-operative Plan: Extubation in OR  Informed Consent: I have reviewed the patients History and Physical, chart, labs and discussed the procedure including the risks, benefits and alternatives for the proposed anesthesia with the patient or authorized representative who has indicated his/her understanding and acceptance.     Dental advisory given  Plan Discussed with: CRNA  Anesthesia Plan Comments: (Lab Results      Component                Value               Date                      WBC                      8.0                 05/05/2021                HGB                      14.7                05/05/2021                HCT                      44.6                05/05/2021                 MCV                      96.7                05/05/2021                PLT                      169  05/05/2021           Lab Results      Component                Value               Date                      NA                       134 (L)             04/06/2021                K                        3.7                 04/06/2021                CO2                      25                  04/06/2021                GLUCOSE                  118 (H)             04/06/2021                BUN                      33 (H)              04/06/2021                CREATININE               1.92 (H)            04/06/2021                CALCIUM                  9.6                 04/06/2021                GFRNONAA                 36 (L)              04/06/2021          )        Anesthesia Quick Evaluation

## 2021-05-05 NOTE — Anesthesia Procedure Notes (Signed)
Procedure Name: LMA Insertion Date/Time: 05/05/2021 3:54 PM Performed by: Griffin Dakin, CRNA Pre-anesthesia Checklist: Patient identified, Emergency Drugs available, Suction available, Patient being monitored and Timeout performed Patient Re-evaluated:Patient Re-evaluated prior to induction Oxygen Delivery Method: Circle system utilized Preoxygenation: Pre-oxygenation with 100% oxygen Induction Type: IV induction Ventilation: Mask ventilation without difficulty LMA: LMA inserted LMA Size: 4.0 Number of attempts: 1 Placement Confirmation: positive ETCO2 and breath sounds checked- equal and bilateral Tube secured with: Tape Dental Injury: Teeth and Oropharynx as per pre-operative assessment

## 2021-05-06 ENCOUNTER — Encounter (HOSPITAL_COMMUNITY): Payer: Self-pay | Admitting: General Surgery

## 2021-05-07 LAB — SURGICAL PATHOLOGY

## 2021-05-08 ENCOUNTER — Ambulatory Visit: Payer: Commercial Managed Care - PPO | Admitting: Hematology

## 2021-05-08 ENCOUNTER — Other Ambulatory Visit: Payer: Self-pay

## 2021-05-08 ENCOUNTER — Inpatient Hospital Stay: Payer: Commercial Managed Care - PPO

## 2021-05-08 ENCOUNTER — Other Ambulatory Visit: Payer: Commercial Managed Care - PPO

## 2021-05-08 ENCOUNTER — Inpatient Hospital Stay: Payer: Commercial Managed Care - PPO | Attending: Nurse Practitioner | Admitting: Hematology

## 2021-05-08 VITALS — BP 137/96 | HR 71 | Temp 98.1°F | Resp 18 | Ht 71.0 in | Wt 222.8 lb

## 2021-05-08 DIAGNOSIS — C186 Malignant neoplasm of descending colon: Secondary | ICD-10-CM | POA: Insufficient documentation

## 2021-05-08 DIAGNOSIS — Z95828 Presence of other vascular implants and grafts: Secondary | ICD-10-CM

## 2021-05-08 DIAGNOSIS — Z7901 Long term (current) use of anticoagulants: Secondary | ICD-10-CM | POA: Insufficient documentation

## 2021-05-08 DIAGNOSIS — I4891 Unspecified atrial fibrillation: Secondary | ICD-10-CM | POA: Insufficient documentation

## 2021-05-08 DIAGNOSIS — N183 Chronic kidney disease, stage 3 unspecified: Secondary | ICD-10-CM | POA: Insufficient documentation

## 2021-05-08 DIAGNOSIS — E039 Hypothyroidism, unspecified: Secondary | ICD-10-CM | POA: Diagnosis not present

## 2021-05-08 DIAGNOSIS — Z452 Encounter for adjustment and management of vascular access device: Secondary | ICD-10-CM | POA: Insufficient documentation

## 2021-05-08 DIAGNOSIS — C787 Secondary malignant neoplasm of liver and intrahepatic bile duct: Secondary | ICD-10-CM | POA: Insufficient documentation

## 2021-05-08 DIAGNOSIS — C786 Secondary malignant neoplasm of retroperitoneum and peritoneum: Secondary | ICD-10-CM | POA: Insufficient documentation

## 2021-05-08 DIAGNOSIS — I129 Hypertensive chronic kidney disease with stage 1 through stage 4 chronic kidney disease, or unspecified chronic kidney disease: Secondary | ICD-10-CM | POA: Insufficient documentation

## 2021-05-08 DIAGNOSIS — Z87440 Personal history of urinary (tract) infections: Secondary | ICD-10-CM | POA: Insufficient documentation

## 2021-05-08 LAB — CBC WITH DIFFERENTIAL (CANCER CENTER ONLY)
Abs Immature Granulocytes: 0.06 10*3/uL (ref 0.00–0.07)
Basophils Absolute: 0 10*3/uL (ref 0.0–0.1)
Basophils Relative: 0 %
Eosinophils Absolute: 0.1 10*3/uL (ref 0.0–0.5)
Eosinophils Relative: 1 %
HCT: 44.5 % (ref 39.0–52.0)
Hemoglobin: 14.7 g/dL (ref 13.0–17.0)
Immature Granulocytes: 1 %
Lymphocytes Relative: 25 %
Lymphs Abs: 3 10*3/uL (ref 0.7–4.0)
MCH: 31.7 pg (ref 26.0–34.0)
MCHC: 33 g/dL (ref 30.0–36.0)
MCV: 96.1 fL (ref 80.0–100.0)
Monocytes Absolute: 1.1 10*3/uL — ABNORMAL HIGH (ref 0.1–1.0)
Monocytes Relative: 9 %
Neutro Abs: 7.6 10*3/uL (ref 1.7–7.7)
Neutrophils Relative %: 64 %
Platelet Count: 155 10*3/uL (ref 150–400)
RBC: 4.63 MIL/uL (ref 4.22–5.81)
RDW: 13.9 % (ref 11.5–15.5)
WBC Count: 11.9 10*3/uL — ABNORMAL HIGH (ref 4.0–10.5)
nRBC: 0 % (ref 0.0–0.2)

## 2021-05-08 LAB — TOTAL PROTEIN, URINE DIPSTICK: Protein, ur: 100 mg/dL — AB

## 2021-05-08 LAB — CMP (CANCER CENTER ONLY)
ALT: 21 U/L (ref 0–44)
AST: 19 U/L (ref 15–41)
Albumin: 3.7 g/dL (ref 3.5–5.0)
Alkaline Phosphatase: 127 U/L — ABNORMAL HIGH (ref 38–126)
Anion gap: 9 (ref 5–15)
BUN: 31 mg/dL — ABNORMAL HIGH (ref 8–23)
CO2: 26 mmol/L (ref 22–32)
Calcium: 9.2 mg/dL (ref 8.9–10.3)
Chloride: 105 mmol/L (ref 98–111)
Creatinine: 1.24 mg/dL (ref 0.61–1.24)
GFR, Estimated: >60 mL/min (ref >60)
Glucose, Bld: 76 mg/dL (ref 70–99)
Potassium: 4.1 mmol/L (ref 3.5–5.1)
Sodium: 140 mmol/L (ref 135–145)
Total Bilirubin: 0.5 mg/dL (ref 0.3–1.2)
Total Protein: 7.1 g/dL (ref 6.5–8.1)

## 2021-05-08 LAB — CEA (IN HOUSE-CHCC): CEA (CHCC-In House): 17.19 ng/mL — ABNORMAL HIGH (ref 0.00–5.00)

## 2021-05-08 MED ORDER — SODIUM CHLORIDE 0.9% FLUSH
10.0000 mL | INTRAVENOUS | Status: DC | PRN
  Administered 2021-05-08: 10 mL

## 2021-05-08 NOTE — Progress Notes (Addendum)
[place]   [phone] [phone]   Clinic Follow up Note   Patient Care Team: [name], MD as PCP - General (Family Medicine) [name], MD as Consulting Physician (Cardiology) [name], MD as Consulting Physician (Gastroenterology) [name], MD as Consulting Physician (General Surgery) [name], MD as Consulting Physician (General Surgery) [name], MD as Consulting Physician (Urology) [name], MD as Consulting Physician (Medical Oncology)  Date of Service:  05/08/2021  CHIEF COMPLAINT: f/u of metastatic colon cancer  CURRENT THERAPY:  First line FOLFIRI q2weeks starting 01/28/20 for 8 cycles. Bevacizumab added with C2. Changed to maintenance Xeloda 2000 mg twice daily for 2 weeks on/1 week off and bevacizumab 06/02/20. Continue Beva every 3 weeks. C3 dose reduce to 1526m BID due to skin toxicity. chemo held after 04/01/21 due to surgery   ASSESSMENT & PLAN:  SDEMARLO RIOJASis a 75y.o. male with   1. Cancer of left colon, adenocarcinoma, stage IIIB (pT3N1cM0), MSI-stable, KRAS Q61H mutation (+) -Diagnosed in 01/2018. Treated with surgery and adjuvant FOLFOX. Due to side effects Oxaliplatin was stopped after 3 cycles and chemo was stopped after 6 cycles. He declined completing 6 months of chemo treatment.  -unfortunately, he developed local recurrence in left abdominal wall and peritoneal metastasis, 2 hypermetabolic liver lets in 71/3086 Abdominal wall biopsy conformed metastatic colorectal adenocarcinoma in 01/2020  -Began first line dose-reduced FOLFIRI on 01/28/2020 -Foundation One showed Kras (+) positive, he is not a candidate for EGFR inhibitor.  Bevacizumab was added with cycle 2  -S/p 8 cycles of FOLFIRI and bevacizumab from cycle 2; tolerated well with fatigue, nausea, diarrhea. CEA normalized after cycle 4, then switched to maintenance xeloda and bevacizumab  -Cycle 2 Xeloda reduced  due to skin toxicity (skin rash to his forearms and sensitivity to his feet) -beva has been held since 01/12/2021 due to high proteinuria and the recent surgeries -he went to the ED on 04/06/21 with hematuria. CT renal stone protocol showed new intramuscular hyperdensity at level of prior ostomy, indeterminate. -he underwent abdominal scar excision on 05/05/21 under Dr. RRosendo Gros Unfortunately, pathology from the procedure showed adenocarcinoma. I reviewed the results with the patient and his wife today. I recommend returning to FOLFIRI, but he is hesitant and wants to improve his quality of life now that surgery is done.  We discussed the option of Capox. We will have him restart the Xeloda on 05/20/21; we will give him another month to heal before moving forward with CAPOX, he is agreeable. He previously had it as adjuvant chemo  -I will also order restaging scan to be done in the next 3 weeks. -labs reviewed, his CEA continues to rise, now 17.19 today, consistent with cancer progression.   2. Hypothyroidism -Previously on synthroid, TSH normal 2 years ago. He has not been taking synthroid for a while -TSH in 02/2020 up to 67, he went back on Synthroid since 02/16/20   3.  BPH  -Followed by urologist Dr. WLovena Neighbours-Pelvic MRI on 7/1 shows marked prostatomegaly with signs of BPH extending into the bladder base.  -Denies new or worsening urinary symptoms   4. Atrial Fibrillation -continue Eliquis. Rate controlled.  He can stop Eliquis periodically for bleeding hemorrhoids -Continue follow-up with cardiology -Plan to hold Eliquis for 5 days prior to upcoming abdominal surgery   5. CKD stage III -he has developed mild increased Cr since he started chemo, EGFR around 40-50's  -I encouraged him to  control his blood pressure, cholesterol and BG. I also encouraged him to drink plenty of water.  -Will avoid NSAIDs and CT IV contrast if GFR low -Improved today     PLAN: -restaging CT in next 2-3  weeks -restart Xeloda 05/20/21 for 2 weeks on and one week off  -Lab, flush, f/u and chemo oxaliplatin the week of 06/08/2021  -I spoke with his wife on the phone today    No problem-specific Assessment & Plan notes found for this encounter.   SUMMARY OF ONCOLOGIC HISTORY: Oncology History Overview Note  Cancer Staging Cancer of left colon [facility]) Staging form: Colon and Rectum, AJCC 8th Edition - Pathologic stage from 01/11/2018: Stage IIIB (pT3, pN1c, cM0) - Signed by [name], MD on 01/16/2018     Cancer of left colon ([place])  01/10/2018 Imaging   CT AP W Contrast 01/10/18  IMPRESSION: Irregular soft tissue density causing stricture of the mid descending colon likely the site of obstruction for the dilated small bowel. 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 No evidence of perforation.   Equivocal findings involving the appendix measuring 1.2 cm at the appendiceal tip with mucosal enhancement. No adjacent free fluid or inflammatory change. Findings are nonspecific, but can be seen with early acute appendicitis.   Mild prostatic enlargement. Increased density over the posterior bladder base likely due to the large prostatic impression although cannot completely exclude a bladder mass. Urology protocol CT or ultrasound may be helpful for better evaluation.   Mild cholelithiasis.   Stable 1.5 cm cystic structure over the lower pole right kidney likely slightly hyperdense cyst.   Diverticulosis of the colon.   Aortic Atherosclerosis (ICD10-I70.0).   01/11/2018 Cancer Staging   Staging form: Colon and Rectum, AJCC 8th Edition - Pathologic stage from 01/11/2018: Stage IIIB (pT3, pN1c, cM0) - Signed by [name], MD on 01/16/2018    01/11/2018 Surgery   LEFT COLON RESECTION, TAKEDOWN SPLENIC FLEXURE, COLOSTOMY by Dr. [name]    01/11/2018 Procedure   Colonoscopy 01/11/18 by Dr. [name]  - Malignant completely obstructing tumor in the mid descending colon. Tattooed. - Diverticulosis in the  sigmoid colon. - Non-bleeding internal hemorrhoids. - No specimens collected.   01/11/2018 Pathology Results   Diagnosis 01/11/18  1. Colon, segmental resection for tumor, descending colon - INVASIVE COLORECTAL ADENOCARCINOMA, 4 CM. - TUMOR EXTENDS INTO PERICOLONIC CONNECTIVE TISSUE. - TUMOR FOCALLY INVOLVES RADIAL MARGIN. - ONE MESENTERIC TUMOR DEPOSIT. - THIRTEEN BENIGN LYMPH NODES (0/13). 2. Colon, segmental resection, splenic flexure - BENIGN COLON. - NO EVIDENCE OF MALIGNANCY .   01/11/2018 Tumor Marker   Baseline CEA at 3.4   01/16/2018 Initial Diagnosis   Cancer of left colon ([place])   01/23/2018 Imaging   CT CHEST WO CONTRAST IMPRESSION: 1. No evidence for metastatic disease within the chest. 2. Small left pleural effusion with underlying opacities which may represent atelectasis. Right basilar atelectasis. 3. Few foci of gas within the upper abdomen in the omentum with surrounding fat stranding, likely postsurgical 4. Aortic Atherosclerosis (ICD10-I70.0).   03/08/2018 - 05/15/2018 Chemotherapy   adjuvant FOLOFX. Due to side effects of neuropahty Oxaliplatin was stopped after 3 cycles and chemo was stopped after 6 cycles. He declined completing 6 months of chemo treatment.     03/19/2018 Imaging   03/19/2018 CT AP IMPRESSION: 1. Interval partial left hemicolectomy and descending colostomy. 2. Heterogeneous soft tissue density along the left anterior renal fascia is most likely postoperative (favor fat necrosis). No well-defined fluid collection. 3. Mild left lower  quadrant edema, new since 01/10/2018. This could be postoperative. Superimposed sigmoid diverticulitis and/or cystitis cannot be excluded. 4. Subtle hyperenhancing nodule within the anterior bladder dome cannot be excluded. Consider nonemergent cystoscopy. When this is performed, recommend attention to the left ureterovesicular junction and distal left ureter to evaluate questionable soft tissue fullness. 5.  Cholelithiasis. 6.  Aortic Atherosclerosis (ICD10-I70.0). 7. Prostatomegaly.   11/13/2018 Imaging   CT CAP WO Contrast 11/13/18  IMPRESSION: 1. Reversal of left lower quadrant colostomy with sigmoid colon anastomosis. No complicating features. No findings for residual or recurrent tumor or metastatic disease involving the chest, abdomen or pelvis without contrast. 2. No acute abdominal/pelvic findings. 3. Gallbladder sludge and gallstones but no findings for acute cholecystitis. 4. Stable anterior abdominal wall hernia. 5. The right testicle is in the right inguinal canal.   10/03/2019 Imaging   CT CAP WO contrast  IMPRESSION: 1. New rounded density interposed between the prostate gland and anterior upper rectal wall could represent adenopathy or local extension of anterior rectal tumor. 2. Marked prostatomegaly, prostate volume 150 cubic cm. 3. Other imaging findings of potential clinical significance: Aortic Atherosclerosis (ICD10-I70.0). Coronary atherosclerosis. Trace right pleural effusion. Suspected cholelithiasis. Nonobstructive left nephrolithiasis. Multilevel lumbar impingement. Bilateral mildly retracted testicles. Hypodense exophytic lesion of the right kidney, most likely to be a cyst.   11/02/2019 Procedure   colonoscopy on 11/02/2019 by Dr. [name] Plan showed normal digital rectal exam, 3 polyps in the rectum, descending colon and cecum, and a prior sigmoid: Anastomosis characterized by erythema.  He found an extrinsic nonobstructing medium-sized mass in the proximal rectum about 4 cm in length, no internal rectal mass.   Diagnosis Surgical [P], colon, cecum, descending, rectal, polyp (3) - TUBULAR ADENOMA (TWO) - NO HIGH GRADE DYSPLASIA OR CARCINOMA. - COLONIC FRAGMENT WITH BENIGN LYMPHOID AGGREGATE.   12/07/2019 Imaging   MRI pelvis IMPRESSION: 1. Masslike area in the rectum suspicious for rectal neoplasm, likely T4b based on the appearance of soft tissue extending  along the anterior peritoneal reflection and into the seminal vesicles. Correlation with recent colonoscopy results may be helpful. Area of anastomosis and other areas of the pelvis are not imaged on today's exam.   12/21/2019 PET scan   IMPRESSION: 1. Unfortunately evidence for peritoneal metastasis. Intensely hypermetabolic nodules along the LEFT pericolic gutter. Favor hypermetabolic mass anterior to the rectum to represent serosal implant along the ventral surface of the rectum. 2. local recurrence within the LEFT abdominal wall at site prior colostomy. Intense hypermetabolic thickening of the rectus muscle at this site. 3. Two hypermetabolic hepatic metastasis.   01/15/2020 Relapse/Recurrence   FINAL MICROSCOPIC DIAGNOSIS:   A. SOFT TISSUE, LEFT ABDOMINAL WALL, BIOPSY:  - Adenocarcinoma.  - See comment.   COMMENT:   The morphology is consistent with metastatic colorectal adenocarcinoma.    01/28/2020 -  Chemotherapy   First line FOLFIRI q2weeks starting 01/28/20 for 8 cycles.  -----Bevacizumab added with C2.  -----Changed to maintenance Xeloda 2000 mg twice daily for 2 weeks on/1 week off and bevacizumab 06/02/20. Starting with C3, dose reduce to $RemoveB'1500mg'hbZJCHax$  BID due to skin toxicity.   05/05/2020 Imaging   IMPRESSION: 1. Improved appearance, with reduced size of the hepatic metastatic lesions and reduced size of the peritoneal tumor implants. 2. Other imaging findings of potential clinical significance: Notable prostatomegaly. Multilevel impingement in the lumbar spine. Dependent density in the gallbladder possibly from sludge or gallstones. Degenerative glenohumeral arthropathy bilaterally. 3. Aortic atherosclerosis.   08/15/2020 Imaging   CT CAP  IMPRESSION:  Chest Impression:   No evidence of thoracic metastasis   Abdomen / Pelvis Impression:   1. Hepatic metastasis are no longer measurable by CT imaging. 2. Peritoneal nodular metastasis in the LEFT abdomen are  decreased in size. 3. No evidence of new peritoneal disease. 4. Thickening and LEFT rectus muscle at site of prior metastasis. No interval change. 5. No evidence of new or progressive colorectal carcinoma.     11/27/2020 Imaging   CT CAP  IMPRESSION: 1. There are multiple small pulmonary nodules in the right upper lobe that are new or enlarged compared to prior examination but measuring 4 mm or smaller, suspicious for pulmonary metastatic disease. 2. Unchanged peritoneal nodule adjacent to the tip of the spleen measuring 1.3 x 1.2 cm. Slightly peritoneal nodule adjacent to the splenic flexure measuring no greater than 6 mm, previously 8 mm. 3. No significant change in previously hypermetabolic soft tissue mass centered about the left lower quadrant colostomy site. 4. Previously established hypermetabolic hepatic metastatic disease remains inapparent by CT. 5. Status post sigmoid colon resection and reanastomosis. 6. Prostatomegaly with thickening of the decompressed urinary bladder, likely secondary to chronic outlet obstruction. 7. Cholelithiasis.   Aortic Atherosclerosis (ICD10-I70.0).   01/29/2021 Imaging   CT CAP   IMPRESSION: 1. Minimal interval progression of bilateral pulmonary nodules, concerning for metastatic disease. 2. No substantial change in size of the peritoneal nodule at the inferior tip of the spleen and splenic flexure nodule. 3. Nodular soft tissue measured at the ostomy site previously is not evident today. 4. Marked prostatomegaly. 5. Cholelithiasis. 6. Aortic Atherosclerosis (ICD10-I70.0).      INTERVAL HISTORY:  Dave Vaughn is here for a follow up of metastatic colon cancer. He was last seen by me on 04/01/21. He presents to the clinic alone. We connected with his wife via phone. He reports he is still in a lot of pain from surgery. He notes the prior abdominal pain is gone, but he still has just pain from the procedure itself (was performed  only 2 days ago). He reports that Dr. Rosendo Gros removed the mesh and found a few staples. He also reports he has a foley catheter in place following UTI earlier this month. He notes he will see his urologist next week.   All other systems were reviewed with the patient and are negative.  MEDICAL HISTORY:  Past Medical History:  Diagnosis Date   Adenocarcinoma, colon ([place]) dx'd 01/2018   Anemia    taking iron supplements   Anxiety    Atrial fibrillation with RVR (H[facility])    Colonic obstruction ([place]) 01/10/2018   Depression    Diverticulitis    Dysrhythmia    afib   History of kidney stones    Hypertension    Indwelling Foley catheter present    due to UTI    SURGICAL HISTORY: Past Surgical History:  Procedure Laterality Date   ANKLE SURGERY Left    when he was in college   COLON RESECTION N/A 01/11/2018   Procedure: LEFT COLON RESECTION, TAKEDOWN SPLENIC FLEXURE, COLOSTOMY;  Surgeon: [name], MD;  Location: [facility];  Service: General;  Laterality: N/A;   COLONOSCOPY  01/11/2018   Procedure: COLONOSCOPY;  Surgeon: Jackquline Denmark, MD;  Location: [facility];  Service: Endoscopy;;   COLONOSCOPY  05/10/2018   colonscopy  05/10/2018   EXCISION OF KELOID N/A 05/05/2021   Procedure: ABDOMINAL SCAR EXCISION;  Surgeon: Ralene Ok, MD;  Location: [place];  Service: General;  Laterality: N/A;  FOREIGN BODY REMOVAL ABDOMINAL N/A 03/09/2021   Procedure: EXCISION OF FOREIGN BODY;  Surgeon: Ralene Ok, MD;  Location: [place];  Service: General;  Laterality: N/A;   HERNIA REPAIR     HERNIA REPAIR  03/02/2019   EXPLORATORY LAPAROTOMY (N/A Abdomen)   INCISIONAL HERNIA REPAIR N/A 03/02/2019   Procedure: INCISIONAL HERNIA REPAIR , RECTORECTUS VS TAR HERNIA REPAIR;  Surgeon: Ralene Ok, MD;  Location: [place];  Service: General;  Laterality: N/A;   INSERTION OF MESH N/A 03/02/2019   Procedure: Insertion Of Mesh;  Surgeon: Ralene Ok, MD;  Location: [place];  Service: General;  Laterality:  N/A;   LAPAROTOMY N/A 03/02/2019   Procedure: EXPLORATORY LAPAROTOMY;  Surgeon: Ralene Ok, MD;  Location: [place];  Service: General;  Laterality: N/A;   LAPAROTOMY N/A 03/09/2021   Procedure: EXPLORATION OF ABDOMINAL WALL;  Surgeon: Ralene Ok, MD;  Location: [place];  Service: General;  Laterality: N/A;   LYSIS OF ADHESION N/A 06/12/2018   Procedure: LYSIS OF ADHESIONS;  Surgeon: [name], MD;  Location: [facility];  Service: General;  Laterality: N/A;   LYSIS OF ADHESION N/A 03/02/2019   Procedure: Lysis Of Adhesion;  Surgeon: Ralene Ok, MD;  Location: [place];  Service: General;  Laterality: N/A;   PORTACATH PLACEMENT Right 03/07/2018   Procedure: INSERTION PORT-A-CATH RIGHT SUBCLAVIAN;  Surgeon: [name], MD;  Location: [place];  Service: General;  Laterality: Right;   PROCTOSCOPY N/A 06/12/2018   Procedure: RIGID PROCTOSCOPY;  Surgeon: [name], MD;  Location: [facility];  Service: General;  Laterality: N/A;   thumb surgery   2018   cyst removal    I have reviewed the social history and family history with the patient and they are unchanged from previous note.  ALLERGIES:  has No Known Allergies.  MEDICATIONS:  Current Outpatient Medications  Medication Sig Dispense Refill   apixaban (ELIQUIS) 5 MG TABS tablet Take 1 tablet (5 mg total) by mouth 2 (two) times daily. 60 tablet 0   b complex vitamins tablet Take 1 tablet by mouth daily.     capecitabine (XELODA) 500 MG tablet TAKE 3 TABLETS BY MOUTH  EVERY 12 HOURS WITHIN 30  MINUTES OF A MEAL FOR 14  DAYS ON THEN 7 DAYS OFF 84 tablet 1   diazepam (VALIUM) 5 MG tablet Take 5 mg by mouth daily as needed for anxiety.     diltiazem (CARDIZEM CD) 360 MG 24 hr capsule TAKE 1 CAPSULE(360 MG) BY MOUTH DAILY 90 capsule 3   diphenoxylate-atropine (LOMOTIL) 2.5-0.025 MG tablet Take 1 tablet by mouth 4 (four) times daily as needed for diarrhea or loose stools.     finasteride (PROSCAR) 5 MG tablet Take 5 mg by mouth daily.      gabapentin (NEURONTIN) 300 MG capsule Take 600 mg by mouth 3 (three) times daily.     levothyroxine (SYNTHROID) 75 MCG tablet TAKE 1 TABLET(75 MCG) BY MOUTH DAILY (Patient taking differently: Take 75 mcg by mouth daily.) 90 tablet 1   lidocaine-prilocaine (EMLA) cream Apply 1 application topically as needed. (Patient taking differently: Apply 1 application topically as needed (port site).) 30 g 1   Multiple Vitamins-Iron (MULTIVITAMIN/IRON PO) Take 1 tablet by mouth daily.      oxyCODONE-acetaminophen (PERCOCET) 5-325 MG tablet Take 1 tablet by mouth every 4 (four) hours as needed for severe pain. 20 tablet 0   prochlorperazine (COMPAZINE) 10 MG tablet Take 1 tablet (10 mg total) by mouth every 6 (six) hours  as needed for nausea or vomiting. 30 tablet 0   tamsulosin (FLOMAX) 0.4 MG CAPS capsule Take 0.4 mg by mouth 2 (two) times daily.      No current facility-administered medications for this visit.    PHYSICAL EXAMINATION: ECOG PERFORMANCE STATUS: 2 - Symptomatic, <50% confined to bed  Vitals:   05/08/21 1045  BP: (!) 137/96  Pulse: 71  Resp: 18  Temp: 98.1 F (36.7 C)  SpO2: 98%   Wt Readings from Last 3 Encounters:  05/08/21 222 lb 12.8 oz (101.1 kg)  05/05/21 205 lb (93 kg)  04/09/21 205 lb 0.4 oz (93 kg)     GENERAL:alert, no distress and comfortable SKIN: skin color normal, no rashes or significant lesions EYES: normal, Conjunctiva are pink and non-injected, sclera clear  NEURO: alert & oriented x 3 with fluent speech  LABORATORY DATA:  I have reviewed the data as listed CBC Latest Ref Rng & Units 05/08/2021 05/05/2021 04/06/2021  WBC 4.0 - 10.5 K/uL 11.9(H) 8.0 17.5(H)  Hemoglobin 13.0 - 17.0 g/dL 14.7 14.7 13.8  Hematocrit 39.0 - 52.0 % 44.5 44.6 41.4  Platelets 150 - 400 K/uL 155 169 161     CMP Latest Ref Rng & Units 05/08/2021 05/05/2021 04/06/2021  Glucose 70 - 99 mg/dL 76 100(H) 118(H)  BUN 8 - 23 mg/dL 31(H) 18 33(H)  Creatinine 0.61 - 1.24 mg/dL 1.24 1.16  1.92(H)  Sodium 135 - 145 mmol/L 140 137 134(L)  Potassium 3.5 - 5.1 mmol/L 4.1 4.1 3.7  Chloride 98 - 111 mmol/L 105 104 97(L)  CO2 22 - 32 mmol/L _0 Calcium 8.9 - 10.3 mg/dL 9.2 9.0 9.6  Total Protein 6.5 - 8.1 g/dL 7.1 - -  Total Bilirubin 0.3 - 1.2 mg/dL 0.5 - -  Alkaline Phos 38 - 126 U/L 127(H) - -  AST 15 - 41 U/L 19 - -  ALT 0 - 44 U/L 21 - -      RADIOGRAPHIC STUDIES: I have personally reviewed the radiological images as listed and agreed with the findings in the report. No results found.    No orders of the defined types were placed in this encounter.  All questions were answered. The patient knows to call the clinic with any problems, questions or concerns. No barriers to learning was detected. The total time spent in the appointment was 40 minutes.     [name], MD 05/08/2021   I, Wilburn Mylar, am acting as scribe for [name], MD.   I have reviewed the above documentation for accuracy and completeness, and I agree with the above.

## 2021-05-10 ENCOUNTER — Encounter: Payer: Self-pay | Admitting: Hematology

## 2021-05-10 NOTE — Progress Notes (Signed)
DISCONTINUE ON PATHWAY REGIMEN - Colorectal     A cycle is every 14 days:     Bevacizumab-xxxx      Irinotecan      Leucovorin      Fluorouracil      Fluorouracil   **Always confirm dose/schedule in your pharmacy ordering system**  REASON: Disease Progression PRIOR TREATMENT: [id number]: FOLFIRI + Bevacizumab q14 Days TREATMENT RESPONSE: Partial Response (PR)  START ON PATHWAY REGIMEN - Colorectal     A cycle is every 21 days:     Capecitabine      Bevacizumab-xxxx      Oxaliplatin   **Always confirm dose/schedule in your pharmacy ordering system**  Patient Characteristics: Distant Metastases, Nonsurgical Candidate, KRAS/NRAS Mutation Positive/Unknown (BRAF V600 Wild-Type/Unknown), Standard Cytotoxic Therapy, Second Line Standard Cytotoxic Therapy, Bevacizumab Eligible Tumor Location: Colon Therapeutic Status: Distant Metastases Microsatellite/Mismatch Repair Status: MSS/pMMR BRAF Mutation Status: Wild-Type (no mutation) KRAS/NRAS Mutation Status: Mutation Positive Standard Cytotoxic Line of Therapy: Second Line Standard Cytotoxic Therapy Bevacizumab Eligibility: Eligible Intent of Therapy: Non-Curative / Palliative Intent, Discussed with Patient

## 2021-05-11 ENCOUNTER — Encounter (HOSPITAL_COMMUNITY): Payer: Self-pay | Admitting: General Surgery

## 2021-05-13 ENCOUNTER — Other Ambulatory Visit: Payer: Self-pay

## 2021-05-13 NOTE — Progress Notes (Signed)
The proposed treatment discussed in conference is for discussion purpose only and is not a binding recommendation.  The patients have not been physically examined, or presented with their treatment options.  Therefore, final treatment plans cannot be decided.  

## 2021-06-02 ENCOUNTER — Ambulatory Visit (HOSPITAL_COMMUNITY)
Admission: RE | Admit: 2021-06-02 | Discharge: 2021-06-02 | Disposition: A | Discharge status: Home / Self Care | Payer: Commercial Managed Care - PPO | Source: Ambulatory Visit | Attending: Hematology | Admitting: Hematology

## 2021-06-02 DIAGNOSIS — C186 Malignant neoplasm of descending colon: Secondary | ICD-10-CM | POA: Insufficient documentation

## 2021-06-02 MED ORDER — IOHEXOL 350 MG/ML SOLN
100.0000 mL | Freq: Once | INTRAVENOUS | Status: AC | PRN
  Administered 2021-06-02: 17:00:00 100 mL via INTRAVENOUS

## 2021-06-02 MED ORDER — SODIUM CHLORIDE (PF) 0.9 % IJ SOLN
INTRAMUSCULAR | Status: AC
  Filled 2021-06-02: qty 50

## 2021-06-03 NOTE — Progress Notes (Signed)
Pharmacist Chemotherapy Monitoring - Initial Assessment    Anticipated start date: 06/11/21    The following has been reviewed per standard work regarding the patient's treatment regimen: The patient's diagnosis, treatment plan and drug doses, and organ/hematologic function Lab orders and baseline tests specific to treatment regimen  The treatment plan start date, drug sequencing, and pre-medications Prior authorization status  Patient's documented medication list, including drug-drug interaction screen and prescriptions for anti-emetics and supportive care specific to the treatment regimen The drug concentrations, fluid compatibility, administration routes, and timing of the medications to be used The patient's access for treatment and lifetime cumulative dose history, if applicable  The patient's medication allergies and previous infusion related reactions, if applicable   Changes made to treatment plan:  N/A  Follow up needed:  N/A   Dave Vaughn, [place], 06/03/2021  12:58 PM

## 2021-06-10 MED FILL — Dexamethasone Sodium Phosphate Inj 100 MG/10ML: INTRAMUSCULAR | Qty: 1 | Status: AC

## 2021-06-11 ENCOUNTER — Other Ambulatory Visit: Payer: Self-pay

## 2021-06-11 ENCOUNTER — Inpatient Hospital Stay: Payer: Commercial Managed Care - PPO

## 2021-06-11 ENCOUNTER — Inpatient Hospital Stay (HOSPITAL_BASED_OUTPATIENT_CLINIC_OR_DEPARTMENT_OTHER): Payer: Commercial Managed Care - PPO | Admitting: Hematology

## 2021-06-11 ENCOUNTER — Inpatient Hospital Stay: Payer: Commercial Managed Care - PPO | Attending: Nurse Practitioner

## 2021-06-11 ENCOUNTER — Encounter: Payer: Self-pay | Admitting: Hematology

## 2021-06-11 ENCOUNTER — Ambulatory Visit: Payer: Commercial Managed Care - PPO

## 2021-06-11 VITALS — BP 143/77 | HR 84 | Temp 98.6°F | Resp 17 | Ht 71.0 in | Wt 228.3 lb

## 2021-06-11 DIAGNOSIS — Z5112 Encounter for antineoplastic immunotherapy: Secondary | ICD-10-CM | POA: Insufficient documentation

## 2021-06-11 DIAGNOSIS — C186 Malignant neoplasm of descending colon: Secondary | ICD-10-CM | POA: Diagnosis present

## 2021-06-11 DIAGNOSIS — C786 Secondary malignant neoplasm of retroperitoneum and peritoneum: Secondary | ICD-10-CM | POA: Insufficient documentation

## 2021-06-11 DIAGNOSIS — C787 Secondary malignant neoplasm of liver and intrahepatic bile duct: Secondary | ICD-10-CM | POA: Diagnosis not present

## 2021-06-11 DIAGNOSIS — Z5111 Encounter for antineoplastic chemotherapy: Secondary | ICD-10-CM | POA: Diagnosis present

## 2021-06-11 DIAGNOSIS — N183 Chronic kidney disease, stage 3 unspecified: Secondary | ICD-10-CM | POA: Diagnosis not present

## 2021-06-11 DIAGNOSIS — Z7901 Long term (current) use of anticoagulants: Secondary | ICD-10-CM | POA: Diagnosis not present

## 2021-06-11 DIAGNOSIS — E039 Hypothyroidism, unspecified: Secondary | ICD-10-CM | POA: Diagnosis not present

## 2021-06-11 DIAGNOSIS — I129 Hypertensive chronic kidney disease with stage 1 through stage 4 chronic kidney disease, or unspecified chronic kidney disease: Secondary | ICD-10-CM | POA: Insufficient documentation

## 2021-06-11 DIAGNOSIS — Z95828 Presence of other vascular implants and grafts: Secondary | ICD-10-CM

## 2021-06-11 DIAGNOSIS — I4891 Unspecified atrial fibrillation: Secondary | ICD-10-CM | POA: Insufficient documentation

## 2021-06-11 DIAGNOSIS — C7800 Secondary malignant neoplasm of unspecified lung: Secondary | ICD-10-CM | POA: Diagnosis not present

## 2021-06-11 LAB — CMP (CANCER CENTER ONLY)
ALT: 17 U/L (ref 0–44)
AST: 19 U/L (ref 15–41)
Albumin: 4.1 g/dL (ref 3.5–5.0)
Alkaline Phosphatase: 99 U/L (ref 38–126)
Anion gap: 6 (ref 5–15)
BUN: 25 mg/dL — ABNORMAL HIGH (ref 8–23)
CO2: 30 mmol/L (ref 22–32)
Calcium: 9.4 mg/dL (ref 8.9–10.3)
Chloride: 105 mmol/L (ref 98–111)
Creatinine: 1.26 mg/dL — ABNORMAL HIGH (ref 0.61–1.24)
GFR, Estimated: 60 mL/min — ABNORMAL LOW (ref >60)
Glucose, Bld: 97 mg/dL (ref 70–99)
Potassium: 3.8 mmol/L (ref 3.5–5.1)
Sodium: 141 mmol/L (ref 135–145)
Total Bilirubin: 1 mg/dL (ref 0.3–1.2)
Total Protein: 7.1 g/dL (ref 6.5–8.1)

## 2021-06-11 LAB — CBC WITH DIFFERENTIAL (CANCER CENTER ONLY)
Abs Immature Granulocytes: 0.01 10*3/uL (ref 0.00–0.07)
Basophils Absolute: 0 10*3/uL (ref 0.0–0.1)
Basophils Relative: 1 %
Eosinophils Absolute: 0.1 10*3/uL (ref 0.0–0.5)
Eosinophils Relative: 2 %
HCT: 41.5 % (ref 39.0–52.0)
Hemoglobin: 13.6 g/dL (ref 13.0–17.0)
Immature Granulocytes: 0 %
Lymphocytes Relative: 32 %
Lymphs Abs: 1.9 10*3/uL (ref 0.7–4.0)
MCH: 31 pg (ref 26.0–34.0)
MCHC: 32.8 g/dL (ref 30.0–36.0)
MCV: 94.5 fL (ref 80.0–100.0)
Monocytes Absolute: 0.5 10*3/uL (ref 0.1–1.0)
Monocytes Relative: 8 %
Neutro Abs: 3.4 10*3/uL (ref 1.7–7.7)
Neutrophils Relative %: 57 %
Platelet Count: 171 10*3/uL (ref 150–400)
RBC: 4.39 MIL/uL (ref 4.22–5.81)
RDW: 15.9 % — ABNORMAL HIGH (ref 11.5–15.5)
WBC Count: 6 10*3/uL (ref 4.0–10.5)
nRBC: 0 % (ref 0.0–0.2)

## 2021-06-11 LAB — TOTAL PROTEIN, URINE DIPSTICK: Protein, ur: 100 mg/dL — AB

## 2021-06-11 LAB — CEA (IN HOUSE-CHCC): CEA (CHCC-In House): 10.5 ng/mL — ABNORMAL HIGH (ref 0.00–5.00)

## 2021-06-11 MED ORDER — OXALIPLATIN CHEMO INJECTION 100 MG/20ML
100.0000 mg/m2 | Freq: Once | INTRAVENOUS | Status: AC
  Administered 2021-06-11: 225 mg via INTRAVENOUS
  Filled 2021-06-11: qty 40

## 2021-06-11 MED ORDER — SODIUM CHLORIDE 0.9 % IV SOLN
7.5000 mg/kg | Freq: Once | INTRAVENOUS | Status: AC
  Administered 2021-06-11: 800 mg via INTRAVENOUS
  Filled 2021-06-11: qty 32

## 2021-06-11 MED ORDER — ONDANSETRON HCL 8 MG PO TABS: 8.0000 mg | ORAL_TABLET | Freq: Three times a day (TID) | ORAL | 1 refills | Status: DC | PRN

## 2021-06-11 MED ORDER — HEPARIN SOD (PORK) LOCK FLUSH 100 UNIT/ML IV SOLN: 500.0000 [IU] | Freq: Once | INTRAVENOUS | Status: DC | PRN

## 2021-06-11 MED ORDER — SODIUM CHLORIDE 0.9 % IV SOLN: Freq: Once | INTRAVENOUS | Status: AC

## 2021-06-11 MED ORDER — SODIUM CHLORIDE 0.9% FLUSH
10.0000 mL | INTRAVENOUS | Status: DC | PRN
  Administered 2021-06-11: 10 mL

## 2021-06-11 MED ORDER — PALONOSETRON HCL INJECTION 0.25 MG/5ML
0.2500 mg | Freq: Once | INTRAVENOUS | Status: AC
  Administered 2021-06-11: 0.25 mg via INTRAVENOUS
  Filled 2021-06-11: qty 5

## 2021-06-11 MED ORDER — DEXTROSE 5 % IV SOLN: Freq: Once | INTRAVENOUS | Status: AC

## 2021-06-11 MED ORDER — LORATADINE 10 MG PO TABS
10.0000 mg | ORAL_TABLET | Freq: Once | ORAL | Status: AC
  Administered 2021-06-11: 10 mg via ORAL
  Filled 2021-06-11: qty 1

## 2021-06-11 MED ORDER — SODIUM CHLORIDE 0.9 % IV SOLN
10.0000 mg | Freq: Once | INTRAVENOUS | Status: AC
  Administered 2021-06-11: 10 mg via INTRAVENOUS
  Filled 2021-06-11: qty 10

## 2021-06-11 MED ORDER — PROCHLORPERAZINE MALEATE 10 MG PO TABS: 10.0000 mg | ORAL_TABLET | Freq: Four times a day (QID) | ORAL | 1 refills | Status: DC | PRN

## 2021-06-11 MED ORDER — FAMOTIDINE 20 MG IN NS 100 ML IVPB
20.0000 mg | Freq: Once | INTRAVENOUS | Status: AC
  Administered 2021-06-11: 20 mg via INTRAVENOUS
  Filled 2021-06-11: qty 100

## 2021-06-11 MED ORDER — SODIUM CHLORIDE 0.9% FLUSH: 10.0000 mL | INTRAVENOUS | Status: DC | PRN

## 2021-06-11 NOTE — Progress Notes (Signed)
[place]   [phone] [phone]   Clinic Follow up Note   Patient Care Team: [name], MD as PCP - General (Family Medicine) [name], MD as Consulting Physician (Cardiology) [name], MD as Consulting Physician (Gastroenterology) [name], MD as Consulting Physician (General Surgery) [name], MD as Consulting Physician (General Surgery) [name], MD as Consulting Physician (Urology) [name], MD as Consulting Physician (Medical Oncology)  Date of Service:  06/11/2021  CHIEF COMPLAINT: f/u of metastatic colon cancer  CURRENT THERAPY:  CAPOX -Xeloda 1500 mg BID 2 weeks on/1 week off, started 05/20/21 -Oxaliplatin and Bevacizumab q3weeks starting 06/11/21  ASSESSMENT & PLAN:  Dave Vaughn is a 76 y.o. male with   1. Cancer of left colon, adenocarcinoma, stage IIIB (pT3N1cM0), MSI-stable, KRAS Q61H mutation (+) -Diagnosed in 01/2018. Treated with surgery and adjuvant FOLFOX. Due to side effects Oxaliplatin was stopped after 3 cycles and chemo was stopped after 6 cycles. He declined completing 6 months of chemo treatment.  -unfortunately, he developed local recurrence in left abdominal wall and peritoneal metastasis, 2 hypermetabolic liver lets in 02/6282. Abdominal wall biopsy conformed metastatic colorectal adenocarcinoma in 01/2020  -Began first line dose-reduced FOLFIRI on 01/28/2020 -Foundation One showed Kras (+) positive, he is not a candidate for EGFR inhibitor.  Bevacizumab was added with cycle 2  -S/p 8 cycles of FOLFIRI and bevacizumab from cycle 2; tolerated well with fatigue, nausea, diarrhea. CEA normalized after cycle 4, then switched to maintenance xeloda and bevacizumab  -Cycle 2 Xeloda reduced due to skin toxicity (skin rash to his forearms and sensitivity to his feet) -beva has been held since 01/12/21 due to high proteinuria and the recent surgeries -he went to the ED on  04/06/21 with hematuria. CT renal stone protocol showed new intramuscular hyperdensity at level of prior ostomy, indeterminate. -he underwent abdominal scar excision on 05/05/21 under Dr. Rosendo Gros. Pathology revealed adenocarcinoma. -CT CAP on 06/02/21 showed progressive disease in lung, liver, colon, and abdominal wall. -He opted to restart CAPOX. He began Xeloda, 1500 mg BID 2 weeks on/1 week off, on 05/20/21 and will start oxaliplatin and bevacizumab today, 06/11/21. He started his most recent cycle on Monday, 06/08/21. He denies skin changes yet. -labs reviewed, urine protein 100, CBC WNL. CEA and CMP pending. Adequate to proceed with treatment today.   2. Hypothyroidism -Previously on synthroid, TSH normal 2 years ago. He has not been taking synthroid for a while -TSH in 02/2020 up to 67, he went back on Synthroid since 02/16/20   3.  BPH  -Followed by urologist Dr. Lovena Neighbours -Pelvic MRI on 7/1 shows marked prostatomegaly with signs of BPH extending into the bladder base.  -Denies new or worsening urinary symptoms   4. Atrial Fibrillation -continue Eliquis. Rate controlled.  He can stop Eliquis periodically for bleeding hemorrhoids -Continue follow-up with cardiology   5. CKD stage III -he has developed mild increased Cr since he started chemo, EGFR around 40-50's  -I encouraged him to control his blood pressure, cholesterol and BG. I also encouraged him to drink plenty of water.  -Will avoid NSAIDs and CT IV contrast if GFR low -Improved today     PLAN: -proceed with oxaliplatin/bevacizumab today -continue Xeloda 1500 mg BID, 14 days on/7 days off.  -I refilled anti-nausea medications  -Lab, flush, f/u and oxali/beva on 07/06/21   No problem-specific Assessment & Plan notes found for this encounter.   SUMMARY OF ONCOLOGIC HISTORY: Oncology History  Overview Note   Cancer Staging  Cancer of left colon [facility]) Staging form: Colon and Rectum, AJCC 8th Edition - Pathologic stage from  01/11/2018: Stage IIIB (pT3, pN1c, cM0) - Signed by [name], MD on 01/16/2018 Total positive nodes: 0 Histologic grading system: 4 grade system Histologic grade (G): G2     Cancer of left colon ([place])  01/10/2018 Imaging   CT AP W Contrast 01/10/18  IMPRESSION: Irregular soft tissue density causing stricture of the mid descending colon likely the site of obstruction for the dilated small bowel. 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 No evidence of perforation.   Equivocal findings involving the appendix measuring 1.2 cm at the appendiceal tip with mucosal enhancement. No adjacent free fluid or inflammatory change. Findings are nonspecific, but can be seen with early acute appendicitis.   Mild prostatic enlargement. Increased density over the posterior bladder base likely due to the large prostatic impression although cannot completely exclude a bladder mass. Urology protocol CT or ultrasound may be helpful for better evaluation.   Mild cholelithiasis.   Stable 1.5 cm cystic structure over the lower pole right kidney likely slightly hyperdense cyst.   Diverticulosis of the colon.   Aortic Atherosclerosis (ICD10-I70.0).   01/11/2018 Cancer Staging   Staging form: Colon and Rectum, AJCC 8th Edition - Pathologic stage from 01/11/2018: Stage IIIB (pT3, pN1c, cM0) - Signed by [name], MD on 01/16/2018    01/11/2018 Surgery   LEFT COLON RESECTION, TAKEDOWN SPLENIC FLEXURE, COLOSTOMY by Dr. [name]    01/11/2018 Procedure   Colonoscopy 01/11/18 by Dr. [name]  - Malignant completely obstructing tumor in the mid descending colon. Tattooed. - Diverticulosis in the sigmoid colon. - Non-bleeding internal hemorrhoids. - No specimens collected.   01/11/2018 Pathology Results   Diagnosis 01/11/18  1. Colon, segmental resection for tumor, descending colon - INVASIVE COLORECTAL ADENOCARCINOMA, 4 CM. - TUMOR EXTENDS INTO PERICOLONIC CONNECTIVE TISSUE. - TUMOR FOCALLY INVOLVES RADIAL MARGIN. - ONE  MESENTERIC TUMOR DEPOSIT. - THIRTEEN BENIGN LYMPH NODES (0/13). 2. Colon, segmental resection, splenic flexure - BENIGN COLON. - NO EVIDENCE OF MALIGNANCY .   01/11/2018 Tumor Marker   Baseline CEA at 3.4   01/16/2018 Initial Diagnosis   Cancer of left colon ([place])   01/23/2018 Imaging   CT CHEST WO CONTRAST IMPRESSION: 1. No evidence for metastatic disease within the chest. 2. Small left pleural effusion with underlying opacities which may represent atelectasis. Right basilar atelectasis. 3. Few foci of gas within the upper abdomen in the omentum with surrounding fat stranding, likely postsurgical 4. Aortic Atherosclerosis (ICD10-I70.0).   03/08/2018 - 05/15/2018 Chemotherapy   adjuvant FOLOFX. Due to side effects of neuropahty Oxaliplatin was stopped after 3 cycles and chemo was stopped after 6 cycles. He declined completing 6 months of chemo treatment.     03/19/2018 Imaging   03/19/2018 CT AP IMPRESSION: 1. Interval partial left hemicolectomy and descending colostomy. 2. Heterogeneous soft tissue density along the left anterior renal fascia is most likely postoperative (favor fat necrosis). No well-defined fluid collection. 3. Mild left lower quadrant edema, new since 01/10/2018. This could be postoperative. Superimposed sigmoid diverticulitis and/or cystitis cannot be excluded. 4. Subtle hyperenhancing nodule within the anterior bladder dome cannot be excluded. Consider nonemergent cystoscopy. When this is performed, recommend attention to the left ureterovesicular junction and distal left ureter to evaluate questionable soft tissue fullness. 5. Cholelithiasis. 6.  Aortic Atherosclerosis (ICD10-I70.0). 7. Prostatomegaly.   11/13/2018 Imaging   CT CAP WO Contrast 11/13/18  IMPRESSION: 1. Reversal  of left lower quadrant colostomy with sigmoid colon anastomosis. No complicating features. No findings for residual or recurrent tumor or metastatic disease involving the chest,  abdomen or pelvis without contrast. 2. No acute abdominal/pelvic findings. 3. Gallbladder sludge and gallstones but no findings for acute cholecystitis. 4. Stable anterior abdominal wall hernia. 5. The right testicle is in the right inguinal canal.   10/03/2019 Imaging   CT CAP WO contrast  IMPRESSION: 1. New rounded density interposed between the prostate gland and anterior upper rectal wall could represent adenopathy or local extension of anterior rectal tumor. 2. Marked prostatomegaly, prostate volume 150 cubic cm. 3. Other imaging findings of potential clinical significance: Aortic Atherosclerosis (ICD10-I70.0). Coronary atherosclerosis. Trace right pleural effusion. Suspected cholelithiasis. Nonobstructive left nephrolithiasis. Multilevel lumbar impingement. Bilateral mildly retracted testicles. Hypodense exophytic lesion of the right kidney, most likely to be a cyst.   11/02/2019 Procedure   colonoscopy on 11/02/2019 by Dr. [name] Plan showed normal digital rectal exam, 3 polyps in the rectum, descending colon and cecum, and a prior sigmoid: Anastomosis characterized by erythema.  He found an extrinsic nonobstructing medium-sized mass in the proximal rectum about 4 cm in length, no internal rectal mass.   Diagnosis Surgical [P], colon, cecum, descending, rectal, polyp (3) - TUBULAR ADENOMA (TWO) - NO HIGH GRADE DYSPLASIA OR CARCINOMA. - COLONIC FRAGMENT WITH BENIGN LYMPHOID AGGREGATE.   12/07/2019 Imaging   MRI pelvis IMPRESSION: 1. Masslike area in the rectum suspicious for rectal neoplasm, likely T4b based on the appearance of soft tissue extending along the anterior peritoneal reflection and into the seminal vesicles. Correlation with recent colonoscopy results may be helpful. Area of anastomosis and other areas of the pelvis are not imaged on today's exam.   12/21/2019 PET scan   IMPRESSION: 1. Unfortunately evidence for peritoneal metastasis. Intensely hypermetabolic  nodules along the LEFT pericolic gutter. Favor hypermetabolic mass anterior to the rectum to represent serosal implant along the ventral surface of the rectum. 2. local recurrence within the LEFT abdominal wall at site prior colostomy. Intense hypermetabolic thickening of the rectus muscle at this site. 3. Two hypermetabolic hepatic metastasis.   01/15/2020 Relapse/Recurrence   FINAL MICROSCOPIC DIAGNOSIS:   A. SOFT TISSUE, LEFT ABDOMINAL WALL, BIOPSY:  - Adenocarcinoma.  - See comment.   COMMENT:   The morphology is consistent with metastatic colorectal adenocarcinoma.    01/28/2020 -  Chemotherapy   First line FOLFIRI q2weeks starting 01/28/20 for 8 cycles.  -----Bevacizumab added with C2.  -----Changed to maintenance Xeloda 2000 mg twice daily for 2 weeks on/1 week off and bevacizumab 06/02/20. Starting with C3, dose reduce to 1538m BID due to skin toxicity.   05/05/2020 Imaging   IMPRESSION: 1. Improved appearance, with reduced size of the hepatic metastatic lesions and reduced size of the peritoneal tumor implants. 2. Other imaging findings of potential clinical significance: Notable prostatomegaly. Multilevel impingement in the lumbar spine. Dependent density in the gallbladder possibly from sludge or gallstones. Degenerative glenohumeral arthropathy bilaterally. 3. Aortic atherosclerosis.   08/15/2020 Imaging   CT CAP  IMPRESSION: Chest Impression:   No evidence of thoracic metastasis   Abdomen / Pelvis Impression:   1. Hepatic metastasis are no longer measurable by CT imaging. 2. Peritoneal nodular metastasis in the LEFT abdomen are decreased in size. 3. No evidence of new peritoneal disease. 4. Thickening and LEFT rectus muscle at site of prior metastasis. No interval change. 5. No evidence of new or progressive colorectal carcinoma.     11/27/2020 Imaging  CT CAP  IMPRESSION: 1. There are multiple small pulmonary nodules in the right upper lobe that are  new or enlarged compared to prior examination but measuring 4 mm or smaller, suspicious for pulmonary metastatic disease. 2. Unchanged peritoneal nodule adjacent to the tip of the spleen measuring 1.3 x 1.2 cm. Slightly peritoneal nodule adjacent to the splenic flexure measuring no greater than 6 mm, previously 8 mm. 3. No significant change in previously hypermetabolic soft tissue mass centered about the left lower quadrant colostomy site. 4. Previously established hypermetabolic hepatic metastatic disease remains inapparent by CT. 5. Status post sigmoid colon resection and reanastomosis. 6. Prostatomegaly with thickening of the decompressed urinary bladder, likely secondary to chronic outlet obstruction. 7. Cholelithiasis.   Aortic Atherosclerosis (ICD10-I70.0).   01/29/2021 Imaging   CT CAP   IMPRESSION: 1. Minimal interval progression of bilateral pulmonary nodules, concerning for metastatic disease. 2. No substantial change in size of the peritoneal nodule at the inferior tip of the spleen and splenic flexure nodule. 3. Nodular soft tissue measured at the ostomy site previously is not evident today. 4. Marked prostatomegaly. 5. Cholelithiasis. 6. Aortic Atherosclerosis (ICD10-I70.0).   04/06/2021 Imaging   EXAM: CT ABDOMEN AND PELVIS WITHOUT CONTRAST (Renal Stone Protocol)  IMPRESSION: 1. Bladder wall thickening with surrounding inflammation concerning for cystitis. 2. Partial colectomy. Colon is air-filled with relative transition just proximal to the anastomosis. Stricture or recurrence at this level would be difficult to exclude. No focal mass identified. 3. There is a new subcutaneous fluid collection at the level of prior ostomy in the left abdominal wall. Infection not excluded. 4. There is new intramuscular hyperdensity at the level of the prior ostomy, indeterminate. Findings may related to scarring, tumor recurrence or metastatic disease is not excluded. 5.  Stable nodularity in the left upper quadrant. 6. Cholelithiasis. 7. Trace right pleural effusion. 8. Stable prostatomegaly. 9.  Aortic Atherosclerosis (ICD10-I70.0).   05/05/2021 Pathology Results   FINAL MICROSCOPIC DIAGNOSIS:   A. ABDOMINAL WALL SCAR, EXCISION:  -  Adenocarcinoma  -  See comment   COMMENT:  Morphologically consistent with colonic adenocarcinoma (similar to  previously reported ([id number]).   06/02/2021 Imaging   EXAM: CT CHEST, ABDOMEN, AND PELVIS WITH CONTRAST  IMPRESSION: 1. Progressive multifocal pulmonary metastatic disease. 2. New low-density hepatic lesions, likely metastases. 3. Progressive peritoneal implant near the splenic flexure of the colon. Small peritoneal implant inferior to the spleen appears unchanged. 4. Increased soft tissue nodularity near the midline incision in the upper anterior abdominal wall, suspicious for tumor recurrence at the incision. This could reflect keloid formation. 5. Fluid collection in the left anterior abdominal wall attributed to recent abdominal surgery with associated small incisional hernia containing fat. 6. Additional incidental findings including prostatomegaly, cholelithiasis and Aortic Atherosclerosis (ICD10-I70.0).   06/11/2021 -  Chemotherapy   Patient is on Treatment Plan : COLORECTAL CapeOx + Bevacizumab q21d        INTERVAL HISTORY:  Dave Vaughn is here for a follow up of metastatic colon cancer. He was last seen by me on 05/08/21. He presents to the clinic alone. He reports he is recovering from surgery. He notes some residual tenderness, but this is improved.   All other systems were reviewed with the patient and are negative.  MEDICAL HISTORY:  Past Medical History:  Diagnosis Date   Adenocarcinoma, colon ([place]) dx'd 01/2018   Anemia    taking iron supplements   Anxiety    Atrial fibrillation with RVR ([place])  Colonic obstruction ([place]) 01/10/2018   Depression    Diverticulitis     Dysrhythmia    afib   History of kidney stones    Hypertension    Indwelling Foley catheter present    due to UTI    SURGICAL HISTORY: Past Surgical History:  Procedure Laterality Date   ANKLE SURGERY Left    when he was in college   COLON RESECTION N/A 01/11/2018   Procedure: LEFT COLON RESECTION, TAKEDOWN SPLENIC FLEXURE, COLOSTOMY;  Surgeon: [name], MD;  Location: [facility];  Service: General;  Laterality: N/A;   COLONOSCOPY  01/11/2018   Procedure: COLONOSCOPY;  Surgeon: Jackquline Denmark, MD;  Location: [facility];  Service: Endoscopy;;   COLONOSCOPY  05/10/2018   colonscopy  05/10/2018   EXCISION OF KELOID N/A 05/05/2021   Procedure: ABDOMINAL SCAR EXCISION;  Surgeon: Ralene Ok, MD;  Location: [place];  Service: General;  Laterality: N/A;   FOREIGN BODY REMOVAL ABDOMINAL N/A 03/09/2021   Procedure: EXCISION OF FOREIGN BODY;  Surgeon: Ralene Ok, MD;  Location: [place];  Service: General;  Laterality: N/A;   HERNIA REPAIR     HERNIA REPAIR  03/02/2019   EXPLORATORY LAPAROTOMY (N/A Abdomen)   INCISIONAL HERNIA REPAIR N/A 03/02/2019   Procedure: INCISIONAL HERNIA REPAIR , RECTORECTUS VS TAR HERNIA REPAIR;  Surgeon: Ralene Ok, MD;  Location: [place];  Service: General;  Laterality: N/A;   INSERTION OF MESH N/A 03/02/2019   Procedure: Insertion Of Mesh;  Surgeon: Ralene Ok, MD;  Location: [place];  Service: General;  Laterality: N/A;   LAPAROTOMY N/A 03/02/2019   Procedure: EXPLORATORY LAPAROTOMY;  Surgeon: Ralene Ok, MD;  Location: [place];  Service: General;  Laterality: N/A;   LAPAROTOMY N/A 03/09/2021   Procedure: EXPLORATION OF ABDOMINAL WALL;  Surgeon: Ralene Ok, MD;  Location: [place];  Service: General;  Laterality: N/A;   LYSIS OF ADHESION N/A 06/12/2018   Procedure: LYSIS OF ADHESIONS;  Surgeon: [name], MD;  Location: [facility];  Service: General;  Laterality: N/A;   LYSIS OF ADHESION N/A 03/02/2019   Procedure: Lysis Of Adhesion;  Surgeon: Ralene Ok, MD;  Location: [place];  Service: General;  Laterality: N/A;   PORTACATH PLACEMENT Right 03/07/2018   Procedure: INSERTION PORT-A-CATH RIGHT SUBCLAVIAN;  Surgeon: [name], MD;  Location: [place];  Service: General;  Laterality: Right;   PROCTOSCOPY N/A 06/12/2018   Procedure: RIGID PROCTOSCOPY;  Surgeon: [name], MD;  Location: [facility];  Service: General;  Laterality: N/A;   thumb surgery   2018   cyst removal    I have reviewed the social history and family history with the patient and they are unchanged from previous note.  ALLERGIES:  has No Known Allergies.  MEDICATIONS:  Current Outpatient Medications  Medication Sig Dispense Refill   ondansetron (ZOFRAN) 8 MG tablet Take 1 tablet (8 mg total) by mouth every 8 (eight) hours as needed for nausea or vomiting. 20 tablet 1   apixaban (ELIQUIS) 5 MG TABS tablet Take 1 tablet (5 mg total) by mouth 2 (two) times daily. 60 tablet 0   b complex vitamins tablet Take 1 tablet by mouth daily.     capecitabine (XELODA) 500 MG tablet TAKE 3 TABLETS BY MOUTH  EVERY 12 HOURS WITHIN 30  MINUTES OF A MEAL FOR 14  DAYS ON THEN 7 DAYS OFF 84 tablet 1   diltiazem (CARDIZEM CD) 360 MG 24 hr capsule TAKE 1 CAPSULE(360 MG) BY MOUTH DAILY 90 capsule  3   diphenoxylate-atropine (LOMOTIL) 2.5-0.025 MG tablet Take 1 tablet by mouth 4 (four) times daily as needed for diarrhea or loose stools.     finasteride (PROSCAR) 5 MG tablet Take 5 mg by mouth daily.     levothyroxine (SYNTHROID) 75 MCG tablet TAKE 1 TABLET(75 MCG) BY MOUTH DAILY (Patient taking differently: Take 75 mcg by mouth daily.) 90 tablet 1   lidocaine-prilocaine (EMLA) cream Apply 1 application topically as needed. (Patient taking differently: Apply 1 application topically as needed (port site).) 30 g 1   Multiple Vitamins-Iron (MULTIVITAMIN/IRON PO) Take 1 tablet by mouth daily.      oxyCODONE-acetaminophen (PERCOCET) 5-325 MG tablet Take 1 tablet by mouth every 4 (four) hours as  needed for severe pain. 20 tablet 0   prochlorperazine (COMPAZINE) 10 MG tablet Take 1 tablet (10 mg total) by mouth every 6 (six) hours as needed for nausea or vomiting. 30 tablet 1   tamsulosin (FLOMAX) 0.4 MG CAPS capsule Take 0.4 mg by mouth 2 (two) times daily.      No current facility-administered medications for this visit.    PHYSICAL EXAMINATION: ECOG PERFORMANCE STATUS: 1 - Symptomatic but completely ambulatory  Vitals:   06/11/21 0928  BP: (!) 143/77  Pulse: 84  Resp: 17  Temp: 98.6 F (37 C)  SpO2: 96%   Wt Readings from Last 3 Encounters:  06/11/21 228 lb 4.8 oz (103.6 kg)  05/08/21 222 lb 12.8 oz (101.1 kg)  05/05/21 205 lb (93 kg)     GENERAL:alert, no distress and comfortable SKIN: skin color normal, no rashes or significant lesions EYES: normal, Conjunctiva are pink and non-injected, sclera clear  NEURO: alert & oriented x 3 with fluent speech  LABORATORY DATA:  I have reviewed the data as listed CBC Latest Ref Rng & Units 06/11/2021 05/08/2021 05/05/2021  WBC 4.0 - 10.5 K/uL 6.0 11.9(H) 8.0  Hemoglobin 13.0 - 17.0 g/dL 13.6 14.7 14.7  Hematocrit 39.0 - 52.0 % 41.5 44.5 44.6  Platelets 150 - 400 K/uL 171 155 169     CMP Latest Ref Rng & Units 06/11/2021 05/08/2021 05/05/2021  Glucose 70 - 99 mg/dL 97 76 100(H)  BUN 8 - 23 mg/dL 25(H) 31(H) 18  Creatinine 0.61 - 1.24 mg/dL 1.26(H) 1.24 1.16  Sodium 135 - 145 mmol/L 141 140 137  Potassium 3.5 - 5.1 mmol/L 3.8 4.1 4.1  Chloride 98 - 111 mmol/L 105 105 104  CO2 22 - 32 mmol/L _0 Calcium 8.9 - 10.3 mg/dL 9.4 9.2 9.0  Total Protein 6.5 - 8.1 g/dL 7.1 7.1 -  Total Bilirubin 0.3 - 1.2 mg/dL 1.0 0.5 -  Alkaline Phos 38 - 126 U/L 99 127(H) -  AST 15 - 41 U/L 19 19 -  ALT 0 - 44 U/L 17 21 -      RADIOGRAPHIC STUDIES: I have personally reviewed the radiological images as listed and agreed with the findings in the report. No results found.    No orders of the defined types were placed in this  encounter.  All questions were answered. The patient knows to call the clinic with any problems, questions or concerns. No barriers to learning was detected. The total time spent in the appointment was 30 minutes.     [name], MD 06/11/2021   I, Wilburn Mylar, am acting as scribe for [name], MD.   I have reviewed the above documentation for accuracy and completeness, and I agree with the above.

## 2021-06-11 NOTE — Patient Instructions (Signed)
[facility]  Discharge Instructions: Thank you for choosing [facility] to provide your oncology and hematology care.   If you have a lab appointment with the Cancer Center, please go directly to the Cancer Center and check in at the registration area.   Wear comfortable clothing and clothing appropriate for easy access to any Portacath or PICC line.   We strive to give you quality time with your provider. You may need to reschedule your appointment if you arrive late (15 or more minutes).  Arriving late affects you and other patients whose appointments are after yours.  Also, if you miss three or more appointments without notifying the office, you may be dismissed from the clinic at the provider's discretion.      For prescription refill requests, have your pharmacy contact our office and allow 72 hours for refills to be completed.    Today you received the following chemotherapy and/or immunotherapy agents: bevacizumab, oxaliplatin      To help prevent nausea and vomiting after your treatment, we encourage you to take your nausea medication as directed.  BELOW ARE SYMPTOMS THAT SHOULD BE REPORTED IMMEDIATELY: *FEVER GREATER THAN 100.4 F (38 C) OR HIGHER *CHILLS OR SWEATING *NAUSEA AND VOMITING THAT IS NOT CONTROLLED WITH YOUR NAUSEA MEDICATION *UNUSUAL SHORTNESS OF BREATH *UNUSUAL BRUISING OR BLEEDING *URINARY PROBLEMS (pain or burning when urinating, or frequent urination) *BOWEL PROBLEMS (unusual diarrhea, constipation, pain near the anus) TENDERNESS IN MOUTH AND THROAT WITH OR WITHOUT PRESENCE OF ULCERS (sore throat, sores in mouth, or a toothache) UNUSUAL RASH, SWELLING OR PAIN  UNUSUAL VAGINAL DISCHARGE OR ITCHING   Items with * indicate a potential emergency and should be followed up as soon as possible or go to the Emergency Department if any problems should occur.  Please show the CHEMOTHERAPY ALERT CARD or IMMUNOTHERAPY ALERT CARD  at check-in to the Emergency Department and triage nurse.  Should you have questions after your visit or need to cancel or reschedule your appointment, please contact [facility]  Dept: [phone]  and follow the prompts.  Office hours are 8:00 a.m. to 4:30 p.m. Monday - Friday. Please note that voicemails left after 4:00 p.m. may not be returned until the following business day.  We are closed weekends and major holidays. You have access to a nurse at all times for urgent questions. Please call the main number to the clinic Dept: [phone] and follow the prompts.   For any non-urgent questions, you may also contact your provider using MyChart. We now offer e-Visits for anyone 18 and older to request care online for non-urgent symptoms. For details visit [email or web address].   Also download the MyChart app! Go to the app store, search "MyChart", open the app, select [facility], and log in with your MyChart username and password.  Due to Covid, a mask is required upon entering the hospital/clinic. If you do not have a mask, one will be given to you upon arrival. For doctor visits, patients may have 1 support person aged 18 or older with them. For treatment visits, patients cannot have anyone with them due to current Covid guidelines and our immunocompromised population.   

## 2021-06-25 ENCOUNTER — Other Ambulatory Visit: Payer: Self-pay | Admitting: Family Medicine

## 2021-06-25 DIAGNOSIS — E039 Hypothyroidism, unspecified: Secondary | ICD-10-CM

## 2021-07-03 ENCOUNTER — Telehealth: Payer: Self-pay | Admitting: *Deleted

## 2021-07-03 MED FILL — Dexamethasone Sodium Phosphate Inj 100 MG/10ML: INTRAMUSCULAR | Qty: 1 | Status: AC

## 2021-07-03 NOTE — Telephone Encounter (Signed)
Transition Care Management Follow-up Telephone Call Date of discharge and from where: 06/20/21  [facility] How have you been since you were released from the hospital?  "Doing well, got good report" Any questions or concerns? Yes  Items Reviewed: Did the pt receive and understand the discharge instructions provided? Yes  Medications obtained and verified? Yes  Other? No  Any new allergies since your discharge? No  Dietary orders reviewed? Yes Do you have support at home? Yes   Home Care and Equipment/Supplies: Were home health services ordered? no If so, what is the name of the agency? N/a  Has the agency set up a time to come to the patient's home? not applicable Were any new equipment or medical supplies ordered?  No What is the name of the medical supply agency? N/a Were you able to get the supplies/equipment? not applicable Do you have any questions related to the use of the equipment or supplies? N/a  Functional Questionnaire: (I = Independent and D = Dependent) ADLs: I  Bathing/Dressing- I  Meal Prep- I  Eating- I  Maintaining continence- I  Transferring/Ambulation- I  Managing Meds- I  Follow up appointments reviewed:  PCP Hospital f/u appt confirmed? No   Specialist Hospital f/u appt confirmed? Yes  Saw surgeon Dr. Leonia Corona this past week Are transportation arrangements needed? No  If their condition worsens, is the pt aware to call PCP or go to the Emergency Dept.? Yes Was the patient provided with contact information for the PCP's office or ED? Yes Was to pt encouraged to call back with questions or concerns? Yes  Jacqlyn Larsen [facility], BSN RN Case Manager [phone]

## 2021-07-06 ENCOUNTER — Inpatient Hospital Stay: Payer: Commercial Managed Care - PPO

## 2021-07-06 ENCOUNTER — Inpatient Hospital Stay: Payer: Commercial Managed Care - PPO | Admitting: Hematology

## 2021-07-07 ENCOUNTER — Other Ambulatory Visit: Payer: Self-pay

## 2021-07-07 NOTE — Progress Notes (Signed)
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 Jenkins Rouge w/[facility] in [place], [place].  Dr. Leonia Corona is recommending that the pt does not resume chemotherapy until late February 2023 per pt's spouse.  Pt is scheduled for a follow-up appt with Dr. Leonia Corona in 1 month per pt's spouse.

## 2021-07-09 ENCOUNTER — Telehealth: Payer: Self-pay

## 2021-07-09 NOTE — Telephone Encounter (Signed)
Pt called and canceled his chemo treatment.  Pt wants to speak with Dr. Burr Medico regarding changing his tx plan.  Informed pt that this RN will notify Dr. Burr Medico of his wishes.  Spoke with Dr Burr Medico regarding the conversation with the pt.  Dr. Burr Medico would like to speak with the pt and his spouse via [place] visit or phone visit today if possible.  Unsuccessful in reaching pt and spouse; therefore, LVM stating Dr. Burr Medico would like to have a MyChart or phone visit with the patient and his wife today if possible to go over pt's tx plan.  Requested if pt or spouse could please give this RN a return call stating their availability.  Awaiting their response.

## 2021-07-10 ENCOUNTER — Inpatient Hospital Stay: Payer: Commercial Managed Care - PPO | Attending: Nurse Practitioner | Admitting: Hematology

## 2021-07-10 DIAGNOSIS — C186 Malignant neoplasm of descending colon: Secondary | ICD-10-CM | POA: Diagnosis not present

## 2021-07-10 NOTE — Progress Notes (Signed)
Dave Vaughn   [phone] Fax:([phone]   Clinic Follow up Note   Patient Care Team: [name], MD as PCP - General (Family Medicine) [name], MD as Consulting Physician (Cardiology) [name], MD as Consulting Physician (Gastroenterology) [name], MD as Consulting Physician (General Surgery) [name], MD as Consulting Physician (General Surgery) [name], MD as Consulting Physician (Urology) [name], MD as Consulting Physician (Medical Oncology)  Date of Service:  07/10/2021  I connected with Dave Vaughn on 07/10/2021 at  4:00 PM EST by telephone visit and verified that I am speaking with the correct person using two identifiers.  I discussed the limitations, risks, security and privacy concerns of performing an evaluation and management service by telephone and the availability of in person appointments. I also discussed with the patient that there may be a patient responsible charge related to this service. The patient expressed understanding and agreed to proceed.   Other persons participating in the visit and their role in the encounter:  patient's wife Dave Vaughn  Patient's location:  home Provider's location:  my office  CHIEF COMPLAINT: f/u of metastatic colon cancer  CURRENT THERAPY:  On Hold due to his recent surgery   ASSESSMENT & PLAN:  Dave Vaughn is a 76 y.o. male with   1. Abdominal hernia with bowel obstruction -required emergent surgery on 06/16/21 while in [place] due to strangulation. -he underwent hernia repair as well as small bowel resection. Pathology was negative for malignancy (results available in Care Everywhere).  2. Cancer of left colon, adenocarcinoma, stage IIIB (pT3N1cM0), MSI-stable, KRAS Q61H mutation (+) -Diagnosed in 01/2018. Treated with surgery and adjuvant FOLFOX. Due to side effects Oxaliplatin was stopped after 3 cycles and chemo was stopped after 6 cycles.  He declined additional treatment. -unfortunately, he developed local recurrence in left abdominal wall and peritoneal metastasis, 2 hypermetabolic liver lets in 0/1751. Abdominal wall biopsy conformed metastatic colorectal adenocarcinoma in 01/2020  -Began first line dose-reduced FOLFIRI on 01/28/20 -Foundation One showed Kras (+) positive, he is not a candidate for EGFR inhibitor.  Bevacizumab was added with cycle 2  -S/p 8 cycles of FOLFIRI and bevacizumab from cycle 2; tolerated well with fatigue, nausea, diarrhea. CEA normalized after cycle 4, then switched to maintenance xeloda and bevacizumab  -Cycle 2 Xeloda reduced due to skin toxicity (skin rash to his forearms and sensitivity to his feet) -beva was held since 01/12/21 due to high proteinuria and recent surgeries -he went to the ED on 04/06/21 with hematuria. CT renal stone protocol showed new intramuscular hyperdensity at level of prior ostomy, indeterminate. -he underwent abdominal scar excision on 05/05/21 under Dr. Rosendo Gros. Pathology revealed adenocarcinoma. -CT CAP on 06/02/21 showed progressive disease in lung, liver, colon, and abdominal wall. -He opted to restart CAPOX. He began Xeloda, 1500 mg BID 2 weeks on/1 week off, on 05/20/21 and started oxaliplatin and bevacizumab on1/5/23. He tolerated infusion poorly with a number of side effects but was able to recover. -we discussed changing treatment. He does not want to try CAPOX or FOLFIRI again due to significant AEs, he expressed interest in returning to [facility]. I explained to him that this is likely not enough to control his cancer at this point due to his recent progression. I offered to have him try low dose CAPOX or FOLFIRI, but he declines due to side effects. I explained that he can restart Xeloda/beva, but his cancer may have developed resistance to this regimen.  He expressed understanding but notes concern with quality of life. He would like to discuss things further with  his surgeon prior to making a final decision. -he will continue to hold the Xeloda to allow for healing from recent surgery. We will call be back after his f/u with his surgeon.   3. Hypothyroidism -Previously on synthroid, TSH normal 2 years ago. He has not been taking synthroid for a while -TSH in 02/2020 up to 67, he went back on Synthroid since 02/16/20   4. BPH  -Followed by urologist Dr. Lovena Neighbours -Pelvic MRI on 7/1 shows marked prostatomegaly with signs of BPH extending into the bladder base.  -Denies new or worsening urinary symptoms   5. Atrial Fibrillation -continue Eliquis. Rate controlled.  He can stop Eliquis periodically for bleeding hemorrhoids -Continue follow-up with cardiology   6. CKD stage III -he has developed mild increased Cr since he started chemo, EGFR around 40-50's  -I encouraged him to control his blood pressure, cholesterol and BG. I also encouraged him to drink plenty of water.  -Will avoid NSAIDs and CT IV contrast if GFR low -Improved today     PLAN: -will continue holding Xeloda and other cancer treatment  -pt will call me back to schedule his f/u and Beva after his f/u with his surgeon in [facility]   No problem-specific Assessment & Plan notes found for this encounter.    SUMMARY OF ONCOLOGIC HISTORY: Oncology History Overview Note   Cancer Staging  Cancer of left colon [facility]) Staging form: Colon and Rectum, AJCC 8th Edition - Pathologic stage from 01/11/2018: Stage IIIB (pT3, pN1c, cM0) - Signed by [name], MD on 01/16/2018 Total positive nodes: 0 Histologic grading system: 4 grade system Histologic grade (G): G2     Cancer of left colon ([place])  01/10/2018 Imaging   CT AP W Contrast 01/10/18  IMPRESSION: Irregular soft tissue density causing stricture of the mid descending colon likely the site of obstruction for the dilated small bowel. 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 No evidence of perforation.   Equivocal findings involving the appendix  measuring 1.2 cm at the appendiceal tip with mucosal enhancement. No adjacent free fluid or inflammatory change. Findings are nonspecific, but can be seen with early acute appendicitis.   Mild prostatic enlargement. Increased density over the posterior bladder base likely due to the large prostatic impression although cannot completely exclude a bladder mass. Urology protocol CT or ultrasound may be helpful for better evaluation.   Mild cholelithiasis.   Stable 1.5 cm cystic structure over the lower pole right kidney likely slightly hyperdense cyst.   Diverticulosis of the colon.   Aortic Atherosclerosis (ICD10-I70.0).   01/11/2018 Cancer Staging   Staging form: Colon and Rectum, AJCC 8th Edition - Pathologic stage from 01/11/2018: Stage IIIB (pT3, pN1c, cM0) - Signed by [name], MD on 01/16/2018    01/11/2018 Surgery   LEFT COLON RESECTION, TAKEDOWN SPLENIC FLEXURE, COLOSTOMY by Dr. [name]    01/11/2018 Procedure   Colonoscopy 01/11/18 by Dr. [name]  - Malignant completely obstructing tumor in the mid descending colon. Tattooed. - Diverticulosis in the sigmoid colon. - Non-bleeding internal hemorrhoids. - No specimens collected.   01/11/2018 Pathology Results   Diagnosis 01/11/18  1. Colon, segmental resection for tumor, descending colon - INVASIVE COLORECTAL ADENOCARCINOMA, 4 CM. - TUMOR EXTENDS INTO PERICOLONIC CONNECTIVE TISSUE. - TUMOR FOCALLY INVOLVES RADIAL MARGIN. - ONE MESENTERIC TUMOR DEPOSIT. - THIRTEEN BENIGN LYMPH NODES (0/13). 2. Colon, segmental resection, splenic flexure - BENIGN  COLON. - NO EVIDENCE OF MALIGNANCY .   01/11/2018 Tumor Marker   Baseline CEA at 3.4   01/16/2018 Initial Diagnosis   Cancer of left colon ([place])   01/23/2018 Imaging   CT CHEST WO CONTRAST IMPRESSION: 1. No evidence for metastatic disease within the chest. 2. Small left pleural effusion with underlying opacities which may represent atelectasis. Right basilar atelectasis. 3. Few foci of  gas within the upper abdomen in the omentum with surrounding fat stranding, likely postsurgical 4. Aortic Atherosclerosis (ICD10-I70.0).   03/08/2018 - 05/15/2018 Chemotherapy   adjuvant FOLOFX. Due to side effects of neuropahty Oxaliplatin was stopped after 3 cycles and chemo was stopped after 6 cycles. He declined completing 6 months of chemo treatment.     03/19/2018 Imaging   03/19/2018 CT AP IMPRESSION: 1. Interval partial left hemicolectomy and descending colostomy. 2. Heterogeneous soft tissue density along the left anterior renal fascia is most likely postoperative (favor fat necrosis). No well-defined fluid collection. 3. Mild left lower quadrant edema, new since 01/10/2018. This could be postoperative. Superimposed sigmoid diverticulitis and/or cystitis cannot be excluded. 4. Subtle hyperenhancing nodule within the anterior bladder dome cannot be excluded. Consider nonemergent cystoscopy. When this is performed, recommend attention to the left ureterovesicular junction and distal left ureter to evaluate questionable soft tissue fullness. 5. Cholelithiasis. 6.  Aortic Atherosclerosis (ICD10-I70.0). 7. Prostatomegaly.   11/13/2018 Imaging   CT CAP WO Contrast 11/13/18  IMPRESSION: 1. Reversal of left lower quadrant colostomy with sigmoid colon anastomosis. No complicating features. No findings for residual or recurrent tumor or metastatic disease involving the chest, abdomen or pelvis without contrast. 2. No acute abdominal/pelvic findings. 3. Gallbladder sludge and gallstones but no findings for acute cholecystitis. 4. Stable anterior abdominal wall hernia. 5. The right testicle is in the right inguinal canal.   10/03/2019 Imaging   CT CAP WO contrast  IMPRESSION: 1. New rounded density interposed between the prostate gland and anterior upper rectal wall could represent adenopathy or local extension of anterior rectal tumor. 2. Marked prostatomegaly, prostate volume  150 cubic cm. 3. Other imaging findings of potential clinical significance: Aortic Atherosclerosis (ICD10-I70.0). Coronary atherosclerosis. Trace right pleural effusion. Suspected cholelithiasis. Nonobstructive left nephrolithiasis. Multilevel lumbar impingement. Bilateral mildly retracted testicles. Hypodense exophytic lesion of the right kidney, most likely to be a cyst.   11/02/2019 Procedure   colonoscopy on 11/02/2019 by Dr. [name] Plan showed normal digital rectal exam, 3 polyps in the rectum, descending colon and cecum, and a prior sigmoid: Anastomosis characterized by erythema.  He found an extrinsic nonobstructing medium-sized mass in the proximal rectum about 4 cm in length, no internal rectal mass.   Diagnosis Surgical [P], colon, cecum, descending, rectal, polyp (3) - TUBULAR ADENOMA (TWO) - NO HIGH GRADE DYSPLASIA OR CARCINOMA. - COLONIC FRAGMENT WITH BENIGN LYMPHOID AGGREGATE.   12/07/2019 Imaging   MRI pelvis IMPRESSION: 1. Masslike area in the rectum suspicious for rectal neoplasm, likely T4b based on the appearance of soft tissue extending along the anterior peritoneal reflection and into the seminal vesicles. Correlation with recent colonoscopy results may be helpful. Area of anastomosis and other areas of the pelvis are not imaged on today's exam.   12/21/2019 PET scan   IMPRESSION: 1. Unfortunately evidence for peritoneal metastasis. Intensely hypermetabolic nodules along the LEFT pericolic gutter. Favor hypermetabolic mass anterior to the rectum to represent serosal implant along the ventral surface of the rectum. 2. local recurrence within the LEFT abdominal wall at site prior colostomy. Intense hypermetabolic thickening of the  rectus muscle at this site. 3. Two hypermetabolic hepatic metastasis.   01/15/2020 Relapse/Recurrence   FINAL MICROSCOPIC DIAGNOSIS:   A. SOFT TISSUE, LEFT ABDOMINAL WALL, BIOPSY:  - Adenocarcinoma.  - See comment.   COMMENT:   The  morphology is consistent with metastatic colorectal adenocarcinoma.    01/28/2020 -  Chemotherapy   First line FOLFIRI q2weeks starting 01/28/20 for 8 cycles.  -----Bevacizumab added with C2.  -----Changed to maintenance Xeloda 2000 mg twice daily for 2 weeks on/1 week off and bevacizumab 06/02/20. Starting with C3, dose reduce to 1554m BID due to skin toxicity.   05/05/2020 Imaging   IMPRESSION: 1. Improved appearance, with reduced size of the hepatic metastatic lesions and reduced size of the peritoneal tumor implants. 2. Other imaging findings of potential clinical significance: Notable prostatomegaly. Multilevel impingement in the lumbar spine. Dependent density in the gallbladder possibly from sludge or gallstones. Degenerative glenohumeral arthropathy bilaterally. 3. Aortic atherosclerosis.   08/15/2020 Imaging   CT CAP  IMPRESSION: Chest Impression:   No evidence of thoracic metastasis   Abdomen / Pelvis Impression:   1. Hepatic metastasis are no longer measurable by CT imaging. 2. Peritoneal nodular metastasis in the LEFT abdomen are decreased in size. 3. No evidence of new peritoneal disease. 4. Thickening and LEFT rectus muscle at site of prior metastasis. No interval change. 5. No evidence of new or progressive colorectal carcinoma.     11/27/2020 Imaging   CT CAP  IMPRESSION: 1. There are multiple small pulmonary nodules in the right upper lobe that are new or enlarged compared to prior examination but measuring 4 mm or smaller, suspicious for pulmonary metastatic disease. 2. Unchanged peritoneal nodule adjacent to the tip of the spleen measuring 1.3 x 1.2 cm. Slightly peritoneal nodule adjacent to the splenic flexure measuring no greater than 6 mm, previously 8 mm. 3. No significant change in previously hypermetabolic soft tissue mass centered about the left lower quadrant colostomy site. 4. Previously established hypermetabolic hepatic metastatic  disease remains inapparent by CT. 5. Status post sigmoid colon resection and reanastomosis. 6. Prostatomegaly with thickening of the decompressed urinary bladder, likely secondary to chronic outlet obstruction. 7. Cholelithiasis.   Aortic Atherosclerosis (ICD10-I70.0).   01/29/2021 Imaging   CT CAP   IMPRESSION: 1. Minimal interval progression of bilateral pulmonary nodules, concerning for metastatic disease. 2. No substantial change in size of the peritoneal nodule at the inferior tip of the spleen and splenic flexure nodule. 3. Nodular soft tissue measured at the ostomy site previously is not evident today. 4. Marked prostatomegaly. 5. Cholelithiasis. 6. Aortic Atherosclerosis (ICD10-I70.0).   04/06/2021 Imaging   EXAM: CT ABDOMEN AND PELVIS WITHOUT CONTRAST (Renal Stone Protocol)  IMPRESSION: 1. Bladder wall thickening with surrounding inflammation concerning for cystitis. 2. Partial colectomy. Colon is air-filled with relative transition just proximal to the anastomosis. Stricture or recurrence at this level would be difficult to exclude. No focal mass identified. 3. There is a new subcutaneous fluid collection at the level of prior ostomy in the left abdominal wall. Infection not excluded. 4. There is new intramuscular hyperdensity at the level of the prior ostomy, indeterminate. Findings may related to scarring, tumor recurrence or metastatic disease is not excluded. 5. Stable nodularity in the left upper quadrant. 6. Cholelithiasis. 7. Trace right pleural effusion. 8. Stable prostatomegaly. 9.  Aortic Atherosclerosis (ICD10-I70.0).   05/05/2021 Pathology Results   FINAL MICROSCOPIC DIAGNOSIS:   A. ABDOMINAL WALL SCAR, EXCISION:  -  Adenocarcinoma  -  See comment  COMMENT:  Morphologically consistent with colonic adenocarcinoma (similar to  previously reported ([id number]).   06/02/2021 Imaging   EXAM: CT CHEST, ABDOMEN, AND PELVIS WITH  CONTRAST  IMPRESSION: 1. Progressive multifocal pulmonary metastatic disease. 2. New low-density hepatic lesions, likely metastases. 3. Progressive peritoneal implant near the splenic flexure of the colon. Small peritoneal implant inferior to the spleen appears unchanged. 4. Increased soft tissue nodularity near the midline incision in the upper anterior abdominal wall, suspicious for tumor recurrence at the incision. This could reflect keloid formation. 5. Fluid collection in the left anterior abdominal wall attributed to recent abdominal surgery with associated small incisional hernia containing fat. 6. Additional incidental findings including prostatomegaly, cholelithiasis and Aortic Atherosclerosis (ICD10-I70.0).   06/11/2021 -  Chemotherapy   Patient is on Treatment Plan : COLORECTAL CapeOx + Bevacizumab q21d        INTERVAL HISTORY:  Dave Vaughn was contacted for a follow up of metastatic colon cancer. He was last seen by me on 06/11/21. He reports he did not tolerate the oxaliplatin/bevacizumab infusion.     All other systems were reviewed with the patient and are negative.  MEDICAL HISTORY:  Past Medical History:  Diagnosis Date   Adenocarcinoma, colon ([place]) dx'd 01/2018   Anemia    taking iron supplements   Anxiety    Atrial fibrillation with RVR (H[facility])    Colonic obstruction ([place]) 01/10/2018   Depression    Diverticulitis    Dysrhythmia    afib   History of kidney stones    Hypertension    Indwelling Foley catheter present    due to UTI    SURGICAL HISTORY: Past Surgical History:  Procedure Laterality Date   ANKLE SURGERY Left    when he was in college   COLON RESECTION N/A 01/11/2018   Procedure: LEFT COLON RESECTION, TAKEDOWN SPLENIC FLEXURE, COLOSTOMY;  Surgeon: [name], MD;  Location: [facility];  Service: General;  Laterality: N/A;   COLONOSCOPY  01/11/2018   Procedure: COLONOSCOPY;  Surgeon: Jackquline Denmark, MD;  Location: [facility];  Service:  Endoscopy;;   COLONOSCOPY  05/10/2018   colonscopy  05/10/2018   EXCISION OF KELOID N/A 05/05/2021   Procedure: ABDOMINAL SCAR EXCISION;  Surgeon: Ralene Ok, MD;  Location: [place];  Service: General;  Laterality: N/A;   FOREIGN BODY REMOVAL ABDOMINAL N/A 03/09/2021   Procedure: EXCISION OF FOREIGN BODY;  Surgeon: Ralene Ok, MD;  Location: [place];  Service: General;  Laterality: N/A;   HERNIA REPAIR     HERNIA REPAIR  03/02/2019   EXPLORATORY LAPAROTOMY (N/A Abdomen)   INCISIONAL HERNIA REPAIR N/A 03/02/2019   Procedure: INCISIONAL HERNIA REPAIR , RECTORECTUS VS TAR HERNIA REPAIR;  Surgeon: Ralene Ok, MD;  Location: [place];  Service: General;  Laterality: N/A;   INSERTION OF MESH N/A 03/02/2019   Procedure: Insertion Of Mesh;  Surgeon: Ralene Ok, MD;  Location: [place];  Service: General;  Laterality: N/A;   LAPAROTOMY N/A 03/02/2019   Procedure: EXPLORATORY LAPAROTOMY;  Surgeon: Ralene Ok, MD;  Location: [place];  Service: General;  Laterality: N/A;   LAPAROTOMY N/A 03/09/2021   Procedure: EXPLORATION OF ABDOMINAL WALL;  Surgeon: Ralene Ok, MD;  Location: [place];  Service: General;  Laterality: N/A;   LYSIS OF ADHESION N/A 06/12/2018   Procedure: LYSIS OF ADHESIONS;  Surgeon: [name], MD;  Location: [facility];  Service: General;  Laterality: N/A;   LYSIS OF ADHESION N/A 03/02/2019   Procedure: Lysis Of Adhesion;  Surgeon: Rosendo Gros,  Anne Hahn, MD;  Location: [place];  Service: General;  Laterality: N/A;   PORTACATH PLACEMENT Right 03/07/2018   Procedure: INSERTION PORT-A-CATH RIGHT SUBCLAVIAN;  Surgeon: [name], MD;  Location: [place];  Service: General;  Laterality: Right;   PROCTOSCOPY N/A 06/12/2018   Procedure: RIGID PROCTOSCOPY;  Surgeon: [name], MD;  Location: [facility];  Service: General;  Laterality: N/A;   thumb surgery   2018   cyst removal    I have reviewed the social history and family history with the patient and they are unchanged from  previous note.  ALLERGIES:  has No Known Allergies.  MEDICATIONS:  Current Outpatient Medications  Medication Sig Dispense Refill   apixaban (ELIQUIS) 5 MG TABS tablet Take 1 tablet (5 mg total) by mouth 2 (two) times daily. 60 tablet 0   b complex vitamins tablet Take 1 tablet by mouth daily.     capecitabine (XELODA) 500 MG tablet TAKE 3 TABLETS BY MOUTH  EVERY 12 HOURS WITHIN 30  MINUTES OF A MEAL FOR 14  DAYS ON THEN 7 DAYS OFF 84 tablet 1   diltiazem (CARDIZEM CD) 360 MG 24 hr capsule TAKE 1 CAPSULE(360 MG) BY MOUTH DAILY 90 capsule 3   diphenoxylate-atropine (LOMOTIL) 2.5-0.025 MG tablet Take 1 tablet by mouth 4 (four) times daily as needed for diarrhea or loose stools.     finasteride (PROSCAR) 5 MG tablet Take 5 mg by mouth daily.     levothyroxine (SYNTHROID) 75 MCG tablet TAKE 1 TABLET(75 MCG) BY MOUTH DAILY 90 tablet 0   lidocaine-prilocaine (EMLA) cream Apply 1 application topically as needed. (Patient taking differently: Apply 1 application topically as needed (port site).) 30 g 1   Multiple Vitamins-Iron (MULTIVITAMIN/IRON PO) Take 1 tablet by mouth daily.      ondansetron (ZOFRAN) 8 MG tablet Take 1 tablet (8 mg total) by mouth every 8 (eight) hours as needed for nausea or vomiting. 20 tablet 1   oxyCODONE-acetaminophen (PERCOCET) 5-325 MG tablet Take 1 tablet by mouth every 4 (four) hours as needed for severe pain. 20 tablet 0   prochlorperazine (COMPAZINE) 10 MG tablet Take 1 tablet (10 mg total) by mouth every 6 (six) hours as needed for nausea or vomiting. 30 tablet 1   tamsulosin (FLOMAX) 0.4 MG CAPS capsule Take 0.4 mg by mouth 2 (two) times daily.      No current facility-administered medications for this visit.    PHYSICAL EXAMINATION: ECOG PERFORMANCE STATUS: 1 - Symptomatic but completely ambulatory  There were no vitals filed for this visit. Wt Readings from Last 3 Encounters:  06/11/21 228 lb 4.8 oz (103.6 kg)  05/08/21 222 lb 12.8 oz (101.1 kg)  05/05/21 205  lb (93 kg)     No vitals taken today, Exam not performed today  LABORATORY DATA:  I have reviewed the data as listed CBC Latest Ref Rng & Units 06/11/2021 05/08/2021 05/05/2021  WBC 4.0 - 10.5 K/uL 6.0 11.9(H) 8.0  Hemoglobin 13.0 - 17.0 g/dL 13.6 14.7 14.7  Hematocrit 39.0 - 52.0 % 41.5 44.5 44.6  Platelets 150 - 400 K/uL 171 155 169     CMP Latest Ref Rng & Units 06/11/2021 05/08/2021 05/05/2021  Glucose 70 - 99 mg/dL 97 76 100(H)  BUN 8 - 23 mg/dL 25(H) 31(H) 18  Creatinine 0.61 - 1.24 mg/dL 1.26(H) 1.24 1.16  Sodium 135 - 145 mmol/L 141 140 137  Potassium 3.5 - 5.1 mmol/L 3.8 4.1 4.1  Chloride 98 - 111 mmol/L 105  105 104  CO2 22 - 32 mmol/L _0 Calcium 8.9 - 10.3 mg/dL 9.4 9.2 9.0  Total Protein 6.5 - 8.1 g/dL 7.1 7.1 -  Total Bilirubin 0.3 - 1.2 mg/dL 1.0 0.5 -  Alkaline Phos 38 - 126 U/L 99 127(H) -  AST 15 - 41 U/L 19 19 -  ALT 0 - 44 U/L 17 21 -      RADIOGRAPHIC STUDIES: I have personally reviewed the radiological images as listed and agreed with the findings in the report. No results found.    No orders of the defined types were placed in this encounter.  All questions were answered. The patient knows to call the clinic with any problems, questions or concerns. No barriers to learning was detected. The total time spent in the appointment was 25 minutes.     [name], MD 07/10/2021   I, Wilburn Mylar, am acting as scribe for [name], MD.   I have reviewed the above documentation for accuracy and completeness, and I agree with the above.

## 2021-07-11 ENCOUNTER — Encounter: Payer: Self-pay | Admitting: Hematology

## 2021-07-13 ENCOUNTER — Other Ambulatory Visit: Payer: Self-pay

## 2021-07-13 ENCOUNTER — Ambulatory Visit: Payer: Commercial Managed Care - PPO | Admitting: Hematology

## 2021-07-13 ENCOUNTER — Other Ambulatory Visit: Payer: Commercial Managed Care - PPO

## 2021-07-13 ENCOUNTER — Ambulatory Visit: Payer: Commercial Managed Care - PPO

## 2021-07-30 ENCOUNTER — Telehealth: Payer: Self-pay

## 2021-07-30 NOTE — Telephone Encounter (Signed)
Pt called to make Dr. Burr Medico aware that his surgeon has cleared him to resume chemotherapy as of 07/30/2021.  Sent staff message to Dr. Burr Medico.

## 2021-07-31 ENCOUNTER — Telehealth: Payer: Self-pay

## 2021-07-31 NOTE — Telephone Encounter (Signed)
Pt LVM wanting to know when he's supposed to restart his oral chemotherapy.  Sent message to Dr. Burr Medico to please advise.

## 2021-08-07 ENCOUNTER — Ambulatory Visit: Payer: Commercial Managed Care - PPO | Admitting: Hematology

## 2021-08-07 ENCOUNTER — Other Ambulatory Visit: Payer: Commercial Managed Care - PPO

## 2021-08-07 ENCOUNTER — Ambulatory Visit: Payer: Commercial Managed Care - PPO

## 2021-08-12 ENCOUNTER — Inpatient Hospital Stay (HOSPITAL_BASED_OUTPATIENT_CLINIC_OR_DEPARTMENT_OTHER): Payer: Commercial Managed Care - PPO | Admitting: Hematology

## 2021-08-12 ENCOUNTER — Inpatient Hospital Stay: Payer: Commercial Managed Care - PPO | Attending: Nurse Practitioner

## 2021-08-12 ENCOUNTER — Other Ambulatory Visit: Payer: Self-pay

## 2021-08-12 ENCOUNTER — Inpatient Hospital Stay: Payer: Commercial Managed Care - PPO

## 2021-08-12 VITALS — BP 145/69 | HR 86 | Temp 98.6°F | Resp 18 | Ht 71.0 in | Wt 228.7 lb

## 2021-08-12 DIAGNOSIS — I129 Hypertensive chronic kidney disease with stage 1 through stage 4 chronic kidney disease, or unspecified chronic kidney disease: Secondary | ICD-10-CM | POA: Diagnosis not present

## 2021-08-12 DIAGNOSIS — Z7901 Long term (current) use of anticoagulants: Secondary | ICD-10-CM | POA: Diagnosis not present

## 2021-08-12 DIAGNOSIS — C787 Secondary malignant neoplasm of liver and intrahepatic bile duct: Secondary | ICD-10-CM | POA: Diagnosis not present

## 2021-08-12 DIAGNOSIS — C7800 Secondary malignant neoplasm of unspecified lung: Secondary | ICD-10-CM | POA: Diagnosis not present

## 2021-08-12 DIAGNOSIS — E039 Hypothyroidism, unspecified: Secondary | ICD-10-CM | POA: Insufficient documentation

## 2021-08-12 DIAGNOSIS — C786 Secondary malignant neoplasm of retroperitoneum and peritoneum: Secondary | ICD-10-CM | POA: Diagnosis not present

## 2021-08-12 DIAGNOSIS — I4891 Unspecified atrial fibrillation: Secondary | ICD-10-CM | POA: Insufficient documentation

## 2021-08-12 DIAGNOSIS — C186 Malignant neoplasm of descending colon: Secondary | ICD-10-CM

## 2021-08-12 DIAGNOSIS — Z5112 Encounter for antineoplastic immunotherapy: Secondary | ICD-10-CM | POA: Insufficient documentation

## 2021-08-12 DIAGNOSIS — N183 Chronic kidney disease, stage 3 unspecified: Secondary | ICD-10-CM | POA: Diagnosis not present

## 2021-08-12 DIAGNOSIS — Z95828 Presence of other vascular implants and grafts: Secondary | ICD-10-CM

## 2021-08-12 LAB — CMP (CANCER CENTER ONLY)
ALT: 15 U/L (ref 0–44)
AST: 18 U/L (ref 15–41)
Albumin: 4 g/dL (ref 3.5–5.0)
Alkaline Phosphatase: 97 U/L (ref 38–126)
Anion gap: 6 (ref 5–15)
BUN: 19 mg/dL (ref 8–23)
CO2: 28 mmol/L (ref 22–32)
Calcium: 9.4 mg/dL (ref 8.9–10.3)
Chloride: 104 mmol/L (ref 98–111)
Creatinine: 0.99 mg/dL (ref 0.61–1.24)
GFR, Estimated: >60 mL/min (ref >60)
Glucose, Bld: 93 mg/dL (ref 70–99)
Potassium: 4.1 mmol/L (ref 3.5–5.1)
Sodium: 138 mmol/L (ref 135–145)
Total Bilirubin: 0.5 mg/dL (ref 0.3–1.2)
Total Protein: 6.6 g/dL (ref 6.5–8.1)

## 2021-08-12 LAB — CBC WITH DIFFERENTIAL (CANCER CENTER ONLY)
Abs Immature Granulocytes: 0.01 10*3/uL (ref 0.00–0.07)
Basophils Absolute: 0 10*3/uL (ref 0.0–0.1)
Basophils Relative: 1 %
Eosinophils Absolute: 0.1 10*3/uL (ref 0.0–0.5)
Eosinophils Relative: 2 %
HCT: 40.8 % (ref 39.0–52.0)
Hemoglobin: 13.6 g/dL (ref 13.0–17.0)
Immature Granulocytes: 0 %
Lymphocytes Relative: 37 %
Lymphs Abs: 2.2 10*3/uL (ref 0.7–4.0)
MCH: 30.2 pg (ref 26.0–34.0)
MCHC: 33.3 g/dL (ref 30.0–36.0)
MCV: 90.5 fL (ref 80.0–100.0)
Monocytes Absolute: 0.6 10*3/uL (ref 0.1–1.0)
Monocytes Relative: 10 %
Neutro Abs: 2.9 10*3/uL (ref 1.7–7.7)
Neutrophils Relative %: 50 %
Platelet Count: 190 10*3/uL (ref 150–400)
RBC: 4.51 MIL/uL (ref 4.22–5.81)
RDW: 13.8 % (ref 11.5–15.5)
WBC Count: 5.8 10*3/uL (ref 4.0–10.5)
nRBC: 0 % (ref 0.0–0.2)

## 2021-08-12 LAB — TOTAL PROTEIN, URINE DIPSTICK: Protein, ur: NEGATIVE mg/dL

## 2021-08-12 LAB — CEA (IN HOUSE-CHCC): CEA (CHCC-In House): 12.32 ng/mL — ABNORMAL HIGH (ref 0.00–5.00)

## 2021-08-12 MED ORDER — SODIUM CHLORIDE 0.9% FLUSH
10.0000 mL | INTRAVENOUS | Status: DC | PRN
  Administered 2021-08-12: 10 mL

## 2021-08-12 MED ORDER — SODIUM CHLORIDE 0.9 % IV SOLN
7.5000 mg/kg | Freq: Once | INTRAVENOUS | Status: AC
  Administered 2021-08-12: 800 mg via INTRAVENOUS
  Filled 2021-08-12: qty 32

## 2021-08-12 MED ORDER — HEPARIN SOD (PORK) LOCK FLUSH 100 UNIT/ML IV SOLN
500.0000 [IU] | Freq: Once | INTRAVENOUS | Status: AC | PRN
  Administered 2021-08-12: 500 [IU]

## 2021-08-12 MED ORDER — SODIUM CHLORIDE 0.9 % IV SOLN: Freq: Once | INTRAVENOUS | Status: AC

## 2021-08-12 MED ORDER — CAPECITABINE 500 MG PO TABS: ORAL_TABLET | ORAL | 1 refills | Status: DC

## 2021-08-12 NOTE — Progress Notes (Signed)
[name]   [phone] Fax:([phone]) [phone]   Clinic Follow up Note   Patient Care Team: [name], MD as PCP - General (Family Medicine) [name], MD as Consulting Physician (Cardiology) [name], MD as Consulting Physician (Gastroenterology) [name], MD as Consulting Physician (General Surgery) [name], MD as Consulting Physician (General Surgery) [name], MD as Consulting Physician (Urology) [name], MD as Consulting Physician (Medical Oncology)  Date of Service:  08/16/2021  CHIEF COMPLAINT: f/u of metastatic colon cancer  CURRENT THERAPY:  Restart Bevacizumab/Xeloda 08/12/21  ASSESSMENT & PLAN:  Dave Vaughn is a 76 y.o. male with   1. Cancer of left colon, adenocarcinoma, stage IIIB (pT3N1cM0), MSI-stable, KRAS Q61H mutation (+) -Diagnosed in 01/2018. Treated with surgery and adjuvant FOLFOX. Due to side effects Oxaliplatin was stopped after 3 cycles and chemo was stopped after 6 cycles. He declined additional treatment. -unfortunately, he developed local recurrence in left abdominal wall and peritoneal metastasis, 2 hypermetabolic liver lets in 02/4173. Abdominal wall biopsy conformed metastatic colorectal adenocarcinoma in 01/2020  -Began first line dose-reduced FOLFIRI on 01/28/20 -Foundation One showed Kras (+) positive, he is not a candidate for EGFR inhibitor.  Bevacizumab was added with cycle 2  -S/p 8 cycles of FOLFIRI and bevacizumab from cycle 2; tolerated well with fatigue, nausea, diarrhea. CEA normalized after cycle 4, then switched to maintenance xeloda and bevacizumab  -Cycle 2 Xeloda reduced due to skin toxicity (skin rash to his forearms and sensitivity to his feet) -beva was held since 01/12/21 due to high proteinuria and recent surgeries -he went to the ED on 04/06/21 with hematuria. CT renal stone protocol showed new intramuscular hyperdensity at level of prior ostomy,  indeterminate. -he underwent abdominal scar excision on 05/05/21 under Dr. Rosendo Gros. Pathology revealed adenocarcinoma. -CT CAP on 06/02/21 showed progressive disease in lung, liver, colon, and abdominal wall. -He opted to restart CAPOX. He began Xeloda, 1500 mg BID 2 weeks on/1 week off, on 05/20/21 and started oxaliplatin and bevacizumab on 06/11/21. He tolerated infusion poorly with a number of side effects but was able to recover. -we again discussed treatment. He still declines to restart oxaliplatin or FOLFIRI again due to significant AEs, he agrees to restart Xeloda/bevcizumab. He will restart beva today and I called in Xeloda.  2. Abdominal hernia with bowel obstruction -required emergent surgery on 06/16/21 while in [place] due to strangulation. -he underwent hernia repair as well as small bowel resection. Pathology was negative for malignancy (results available in Care Everywhere).   3. Hypothyroidism -Previously on synthroid, TSH normal 2 years ago. He has not been taking synthroid for a while -TSH in 02/2020 up to 67, he went back on Synthroid since 02/16/20   4. BPH  -Followed by urologist Dr. Lovena Neighbours -Pelvic MRI on 7/1 shows marked prostatomegaly with signs of BPH extending into the bladder base.  -Denies new or worsening urinary symptoms   5. Atrial Fibrillation -continue Eliquis. Rate controlled.  He can stop Eliquis periodically for bleeding hemorrhoids -Continue follow-up with cardiology   6. CKD stage III -he has developed mild increased Cr since he started chemo, EGFR around 40-50's  -I encouraged him to control his blood pressure, cholesterol and BG. I also encouraged him to drink plenty of water.  -Will avoid NSAIDs and CT IV contrast if GFR low -Improved today     PLAN: -restart bevacizumab today -I called in Xeloda for him to start -lab, flush, f/u, and beva in  3 weeks with chest CT several days before   No problem-specific Assessment & Plan notes found for this  encounter.   SUMMARY OF ONCOLOGIC HISTORY: Oncology History Overview Note   Cancer Staging  Cancer of left colon [facility]) Staging form: Colon and Rectum, AJCC 8th Edition - Pathologic stage from 01/11/2018: Stage IIIB (pT3, pN1c, cM0) - Signed by [name], MD on 01/16/2018 Total positive nodes: 0 Histologic grading system: 4 grade system Histologic grade (G): G2     Cancer of left colon ([place])  01/10/2018 Imaging   CT AP W Contrast 01/10/18  IMPRESSION: Irregular soft tissue density causing stricture of the mid descending colon likely the site of obstruction for the dilated small bowel. 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 No evidence of perforation.   Equivocal findings involving the appendix measuring 1.2 cm at the appendiceal tip with mucosal enhancement. No adjacent free fluid or inflammatory change. Findings are nonspecific, but can be seen with early acute appendicitis.   Mild prostatic enlargement. Increased density over the posterior bladder base likely due to the large prostatic impression although cannot completely exclude a bladder mass. Urology protocol CT or ultrasound may be helpful for better evaluation.   Mild cholelithiasis.   Stable 1.5 cm cystic structure over the lower pole right kidney likely slightly hyperdense cyst.   Diverticulosis of the colon.   Aortic Atherosclerosis (ICD10-I70.0).   01/11/2018 Cancer Staging   Staging form: Colon and Rectum, AJCC 8th Edition - Pathologic stage from 01/11/2018: Stage IIIB (pT3, pN1c, cM0) - Signed by [name], MD on 01/16/2018    01/11/2018 Surgery   LEFT COLON RESECTION, TAKEDOWN SPLENIC FLEXURE, COLOSTOMY by Dr. [name]    01/11/2018 Procedure   Colonoscopy 01/11/18 by Dr. [name]  - Malignant completely obstructing tumor in the mid descending colon. Tattooed. - Diverticulosis in the sigmoid colon. - Non-bleeding internal hemorrhoids. - No specimens collected.   01/11/2018 Pathology Results   Diagnosis 01/11/18  1. Colon,  segmental resection for tumor, descending colon - INVASIVE COLORECTAL ADENOCARCINOMA, 4 CM. - TUMOR EXTENDS INTO PERICOLONIC CONNECTIVE TISSUE. - TUMOR FOCALLY INVOLVES RADIAL MARGIN. - ONE MESENTERIC TUMOR DEPOSIT. - THIRTEEN BENIGN LYMPH NODES (0/13). 2. Colon, segmental resection, splenic flexure - BENIGN COLON. - NO EVIDENCE OF MALIGNANCY .   01/11/2018 Tumor Marker   Baseline CEA at 3.4   01/16/2018 Initial Diagnosis   Cancer of left colon ([place])   01/23/2018 Imaging   CT CHEST WO CONTRAST IMPRESSION: 1. No evidence for metastatic disease within the chest. 2. Small left pleural effusion with underlying opacities which may represent atelectasis. Right basilar atelectasis. 3. Few foci of gas within the upper abdomen in the omentum with surrounding fat stranding, likely postsurgical 4. Aortic Atherosclerosis (ICD10-I70.0).   03/08/2018 - 05/15/2018 Chemotherapy   adjuvant FOLOFX. Due to side effects of neuropahty Oxaliplatin was stopped after 3 cycles and chemo was stopped after 6 cycles. He declined completing 6 months of chemo treatment.     03/19/2018 Imaging   03/19/2018 CT AP IMPRESSION: 1. Interval partial left hemicolectomy and descending colostomy. 2. Heterogeneous soft tissue density along the left anterior renal fascia is most likely postoperative (favor fat necrosis). No well-defined fluid collection. 3. Mild left lower quadrant edema, new since 01/10/2018. This could be postoperative. Superimposed sigmoid diverticulitis and/or cystitis cannot be excluded. 4. Subtle hyperenhancing nodule within the anterior bladder dome cannot be excluded. Consider nonemergent cystoscopy. When this is performed, recommend attention to the left ureterovesicular junction and distal left ureter to evaluate  questionable soft tissue fullness. 5. Cholelithiasis. 6.  Aortic Atherosclerosis (ICD10-I70.0). 7. Prostatomegaly.   11/13/2018 Imaging   CT CAP WO Contrast  11/13/18  IMPRESSION: 1. Reversal of left lower quadrant colostomy with sigmoid colon anastomosis. No complicating features. No findings for residual or recurrent tumor or metastatic disease involving the chest, abdomen or pelvis without contrast. 2. No acute abdominal/pelvic findings. 3. Gallbladder sludge and gallstones but no findings for acute cholecystitis. 4. Stable anterior abdominal wall hernia. 5. The right testicle is in the right inguinal canal.   10/03/2019 Imaging   CT CAP WO contrast  IMPRESSION: 1. New rounded density interposed between the prostate gland and anterior upper rectal wall could represent adenopathy or local extension of anterior rectal tumor. 2. Marked prostatomegaly, prostate volume 150 cubic cm. 3. Other imaging findings of potential clinical significance: Aortic Atherosclerosis (ICD10-I70.0). Coronary atherosclerosis. Trace right pleural effusion. Suspected cholelithiasis. Nonobstructive left nephrolithiasis. Multilevel lumbar impingement. Bilateral mildly retracted testicles. Hypodense exophytic lesion of the right kidney, most likely to be a cyst.   11/02/2019 Procedure   colonoscopy on 11/02/2019 by Dr. [name] Plan showed normal digital rectal exam, 3 polyps in the rectum, descending colon and cecum, and a prior sigmoid: Anastomosis characterized by erythema.  He found an extrinsic nonobstructing medium-sized mass in the proximal rectum about 4 cm in length, no internal rectal mass.   Diagnosis Surgical [P], colon, cecum, descending, rectal, polyp (3) - TUBULAR ADENOMA (TWO) - NO HIGH GRADE DYSPLASIA OR CARCINOMA. - COLONIC FRAGMENT WITH BENIGN LYMPHOID AGGREGATE.   12/07/2019 Imaging   MRI pelvis IMPRESSION: 1. Masslike area in the rectum suspicious for rectal neoplasm, likely T4b based on the appearance of soft tissue extending along the anterior peritoneal reflection and into the seminal vesicles. Correlation with recent colonoscopy results may be  helpful. Area of anastomosis and other areas of the pelvis are not imaged on today's exam.   12/21/2019 PET scan   IMPRESSION: 1. Unfortunately evidence for peritoneal metastasis. Intensely hypermetabolic nodules along the LEFT pericolic gutter. Favor hypermetabolic mass anterior to the rectum to represent serosal implant along the ventral surface of the rectum. 2. local recurrence within the LEFT abdominal wall at site prior colostomy. Intense hypermetabolic thickening of the rectus muscle at this site. 3. Two hypermetabolic hepatic metastasis.   01/15/2020 Relapse/Recurrence   FINAL MICROSCOPIC DIAGNOSIS:   A. SOFT TISSUE, LEFT ABDOMINAL WALL, BIOPSY:  - Adenocarcinoma.  - See comment.   COMMENT:   The morphology is consistent with metastatic colorectal adenocarcinoma.    01/28/2020 -  Chemotherapy   First line FOLFIRI q2weeks starting 01/28/20 for 8 cycles.  -----Bevacizumab added with C2.  -----Changed to maintenance Xeloda 2000 mg twice daily for 2 weeks on/1 week off and bevacizumab 06/02/20. Starting with C3, dose reduce to 1558m BID due to skin toxicity.   05/05/2020 Imaging   IMPRESSION: 1. Improved appearance, with reduced size of the hepatic metastatic lesions and reduced size of the peritoneal tumor implants. 2. Other imaging findings of potential clinical significance: Notable prostatomegaly. Multilevel impingement in the lumbar spine. Dependent density in the gallbladder possibly from sludge or gallstones. Degenerative glenohumeral arthropathy bilaterally. 3. Aortic atherosclerosis.   08/15/2020 Imaging   CT CAP  IMPRESSION: Chest Impression:   No evidence of thoracic metastasis   Abdomen / Pelvis Impression:   1. Hepatic metastasis are no longer measurable by CT imaging. 2. Peritoneal nodular metastasis in the LEFT abdomen are decreased in size. 3. No evidence of new peritoneal disease. 4. Thickening and  LEFT rectus muscle at site of prior metastasis.  No interval change. 5. No evidence of new or progressive colorectal carcinoma.     11/27/2020 Imaging   CT CAP  IMPRESSION: 1. There are multiple small pulmonary nodules in the right upper lobe that are new or enlarged compared to prior examination but measuring 4 mm or smaller, suspicious for pulmonary metastatic disease. 2. Unchanged peritoneal nodule adjacent to the tip of the spleen measuring 1.3 x 1.2 cm. Slightly peritoneal nodule adjacent to the splenic flexure measuring no greater than 6 mm, previously 8 mm. 3. No significant change in previously hypermetabolic soft tissue mass centered about the left lower quadrant colostomy site. 4. Previously established hypermetabolic hepatic metastatic disease remains inapparent by CT. 5. Status post sigmoid colon resection and reanastomosis. 6. Prostatomegaly with thickening of the decompressed urinary bladder, likely secondary to chronic outlet obstruction. 7. Cholelithiasis.   Aortic Atherosclerosis (ICD10-I70.0).   01/29/2021 Imaging   CT CAP   IMPRESSION: 1. Minimal interval progression of bilateral pulmonary nodules, concerning for metastatic disease. 2. No substantial change in size of the peritoneal nodule at the inferior tip of the spleen and splenic flexure nodule. 3. Nodular soft tissue measured at the ostomy site previously is not evident today. 4. Marked prostatomegaly. 5. Cholelithiasis. 6. Aortic Atherosclerosis (ICD10-I70.0).   04/06/2021 Imaging   EXAM: CT ABDOMEN AND PELVIS WITHOUT CONTRAST (Renal Stone Protocol)  IMPRESSION: 1. Bladder wall thickening with surrounding inflammation concerning for cystitis. 2. Partial colectomy. Colon is air-filled with relative transition just proximal to the anastomosis. Stricture or recurrence at this level would be difficult to exclude. No focal mass identified. 3. There is a new subcutaneous fluid collection at the level of prior ostomy in the left abdominal wall.  Infection not excluded. 4. There is new intramuscular hyperdensity at the level of the prior ostomy, indeterminate. Findings may related to scarring, tumor recurrence or metastatic disease is not excluded. 5. Stable nodularity in the left upper quadrant. 6. Cholelithiasis. 7. Trace right pleural effusion. 8. Stable prostatomegaly. 9.  Aortic Atherosclerosis (ICD10-I70.0).   05/05/2021 Pathology Results   FINAL MICROSCOPIC DIAGNOSIS:   A. ABDOMINAL WALL SCAR, EXCISION:  -  Adenocarcinoma  -  See comment   COMMENT:  Morphologically consistent with colonic adenocarcinoma (similar to  previously reported ([id number]).   06/02/2021 Imaging   EXAM: CT CHEST, ABDOMEN, AND PELVIS WITH CONTRAST  IMPRESSION: 1. Progressive multifocal pulmonary metastatic disease. 2. New low-density hepatic lesions, likely metastases. 3. Progressive peritoneal implant near the splenic flexure of the colon. Small peritoneal implant inferior to the spleen appears unchanged. 4. Increased soft tissue nodularity near the midline incision in the upper anterior abdominal wall, suspicious for tumor recurrence at the incision. This could reflect keloid formation. 5. Fluid collection in the left anterior abdominal wall attributed to recent abdominal surgery with associated small incisional hernia containing fat. 6. Additional incidental findings including prostatomegaly, cholelithiasis and Aortic Atherosclerosis (ICD10-I70.0).   06/11/2021 -  Chemotherapy   Patient is on Treatment Plan : COLORECTAL CapeOx + Bevacizumab q21d     07/29/2021 Imaging   EXAMINATION: CT ABDOMEN PELVIS WO CONTRAST  Impression  1. Left anterior abdominal wall postsurgical changes with no recurrent hernia.  2. Stable indeterminate 3 cm ventral abdominal wall mass located at the L1-2 level. Question scarring from prior surgery versus neoplasm.  3. Cholelithiasis.  4. Stable indeterminate bibasilar lung nodules. All of with a CT  chest is recommended.  5. Indeterminate stable right hepatic lesions. Consider follow-up  with an abdominal MRI with and without contrast for additional characterization.        INTERVAL HISTORY:  EVEN BUDLONG is here for a follow up of metastatic colon cancer. He was last seen by me on 07/10/21. He presents to the clinic alone. He reports he is doing well, no new complaints.   All other systems were reviewed with the patient and are negative.  MEDICAL HISTORY:  Past Medical History:  Diagnosis Date   Adenocarcinoma, colon ([place]) dx'd 01/2018   Anemia    taking iron supplements   Anxiety    Atrial fibrillation with RVR ([facility])    Colonic obstruction ([place]) 01/10/2018   Depression    Diverticulitis    Dysrhythmia    afib   History of kidney stones    Hypertension    Indwelling Foley catheter present    due to UTI    SURGICAL HISTORY: Past Surgical History:  Procedure Laterality Date   ANKLE SURGERY Left    when he was in college   COLON RESECTION N/A 01/11/2018   Procedure: LEFT COLON RESECTION, TAKEDOWN SPLENIC FLEXURE, COLOSTOMY;  Surgeon: [name], MD;  Location: [facility];  Service: General;  Laterality: N/A;   COLONOSCOPY  01/11/2018   Procedure: COLONOSCOPY;  Surgeon: Jackquline Denmark, MD;  Location: [facility];  Service: Endoscopy;;   COLONOSCOPY  05/10/2018   colonscopy  05/10/2018   EXCISION OF KELOID N/A 05/05/2021   Procedure: ABDOMINAL SCAR EXCISION;  Surgeon: Ralene Ok, MD;  Location: [place];  Service: General;  Laterality: N/A;   FOREIGN BODY REMOVAL ABDOMINAL N/A 03/09/2021   Procedure: EXCISION OF FOREIGN BODY;  Surgeon: Ralene Ok, MD;  Location: [place];  Service: General;  Laterality: N/A;   HERNIA REPAIR     HERNIA REPAIR  03/02/2019   EXPLORATORY LAPAROTOMY (N/A Abdomen)   INCISIONAL HERNIA REPAIR N/A 03/02/2019   Procedure: INCISIONAL HERNIA REPAIR , RECTORECTUS VS TAR HERNIA REPAIR;  Surgeon: Ralene Ok, MD;  Location: [place];  Service:  General;  Laterality: N/A;   INSERTION OF MESH N/A 03/02/2019   Procedure: Insertion Of Mesh;  Surgeon: Ralene Ok, MD;  Location: [place];  Service: General;  Laterality: N/A;   LAPAROTOMY N/A 03/02/2019   Procedure: EXPLORATORY LAPAROTOMY;  Surgeon: Ralene Ok, MD;  Location: [place];  Service: General;  Laterality: N/A;   LAPAROTOMY N/A 03/09/2021   Procedure: EXPLORATION OF ABDOMINAL WALL;  Surgeon: Ralene Ok, MD;  Location: [place];  Service: General;  Laterality: N/A;   LYSIS OF ADHESION N/A 06/12/2018   Procedure: LYSIS OF ADHESIONS;  Surgeon: [name], MD;  Location: [facility];  Service: General;  Laterality: N/A;   LYSIS OF ADHESION N/A 03/02/2019   Procedure: Lysis Of Adhesion;  Surgeon: Ralene Ok, MD;  Location: [place];  Service: General;  Laterality: N/A;   PORTACATH PLACEMENT Right 03/07/2018   Procedure: INSERTION PORT-A-CATH RIGHT SUBCLAVIAN;  Surgeon: [name], MD;  Location: [place];  Service: General;  Laterality: Right;   PROCTOSCOPY N/A 06/12/2018   Procedure: RIGID PROCTOSCOPY;  Surgeon: [name], MD;  Location: [facility];  Service: General;  Laterality: N/A;   thumb surgery   2018   cyst removal    I have reviewed the social history and family history with the patient and they are unchanged from previous note.  ALLERGIES:  has No Known Allergies.  MEDICATIONS:  Current Outpatient Medications  Medication Sig Dispense Refill   apixaban (ELIQUIS) 5 MG TABS tablet Take 1  tablet (5 mg total) by mouth 2 (two) times daily. 60 tablet 0   b complex vitamins tablet Take 1 tablet by mouth daily.     capecitabine (XELODA) 500 MG tablet Take 3 tabs every 12 hours after a meal, for 14 days then off 7 days 84 tablet 1   diltiazem (CARDIZEM CD) 360 MG 24 hr capsule TAKE 1 CAPSULE(360 MG) BY MOUTH DAILY 90 capsule 3   diphenoxylate-atropine (LOMOTIL) 2.5-0.025 MG tablet Take 1 tablet by mouth 4 (four) times daily as needed for diarrhea or loose stools.      finasteride (PROSCAR) 5 MG tablet Take 5 mg by mouth daily.     levothyroxine (SYNTHROID) 75 MCG tablet TAKE 1 TABLET(75 MCG) BY MOUTH DAILY 90 tablet 0   lidocaine-prilocaine (EMLA) cream Apply 1 application topically as needed. (Patient taking differently: Apply 1 application topically as needed (port site).) 30 g 1   Multiple Vitamins-Iron (MULTIVITAMIN/IRON PO) Take 1 tablet by mouth daily.      ondansetron (ZOFRAN) 8 MG tablet Take 1 tablet (8 mg total) by mouth every 8 (eight) hours as needed for nausea or vomiting. 20 tablet 1   oxyCODONE-acetaminophen (PERCOCET) 5-325 MG tablet Take 1 tablet by mouth every 4 (four) hours as needed for severe pain. 20 tablet 0   prochlorperazine (COMPAZINE) 10 MG tablet Take 1 tablet (10 mg total) by mouth every 6 (six) hours as needed for nausea or vomiting. 30 tablet 1   tamsulosin (FLOMAX) 0.4 MG CAPS capsule Take 0.4 mg by mouth 2 (two) times daily.      No current facility-administered medications for this visit.    PHYSICAL EXAMINATION: ECOG PERFORMANCE STATUS: 1 - Symptomatic but completely ambulatory  Vitals:   08/12/21 1355  BP: (!) 145/69  Pulse: 86  Resp: 18  Temp: 98.6 F (37 C)  SpO2: 98%   Wt Readings from Last 3 Encounters:  08/12/21 228 lb 11.2 oz (103.7 kg)  06/11/21 228 lb 4.8 oz (103.6 kg)  05/08/21 222 lb 12.8 oz (101.1 kg)     GENERAL:alert, no distress and comfortable SKIN: skin color normal, no rashes or significant lesions EYES: normal, Conjunctiva are pink and non-injected, sclera clear  NEURO: alert & oriented x 3 with fluent speech  LABORATORY DATA:  I have reviewed the data as listed CBC Latest Ref Rng & Units 08/12/2021 06/11/2021 05/08/2021  WBC 4.0 - 10.5 K/uL 5.8 6.0 11.9(H)  Hemoglobin 13.0 - 17.0 g/dL 13.6 13.6 14.7  Hematocrit 39.0 - 52.0 % 40.8 41.5 44.5  Platelets 150 - 400 K/uL 190 171 155     CMP Latest Ref Rng & Units 08/12/2021 06/11/2021 05/08/2021  Glucose 70 - 99 mg/dL 93 97 76  BUN 8 - 23 mg/dL  19 25(H) 31(H)  Creatinine 0.61 - 1.24 mg/dL 0.99 1.26(H) 1.24  Sodium 135 - 145 mmol/L 138 141 140  Potassium 3.5 - 5.1 mmol/L 4.1 3.8 4.1  Chloride 98 - 111 mmol/L 104 105 105  CO2 22 - 32 mmol/L 28 30 26   Calcium 8.9 - 10.3 mg/dL 9.4 9.4 9.2  Total Protein 6.5 - 8.1 g/dL 6.6 7.1 7.1  Total Bilirubin 0.3 - 1.2 mg/dL 0.5 1.0 0.5  Alkaline Phos 38 - 126 U/L 97 99 127(H)  AST 15 - 41 U/L 18 19 19   ALT 0 - 44 U/L 15 17 21       RADIOGRAPHIC STUDIES: I have personally reviewed the radiological images as listed and agreed with the findings in  the report. No results found.    Orders Placed This Encounter  Procedures   CT Chest Wo Contrast    Standing Status:   Future    Standing Expiration Date:   08/12/2022    Order Specific Question:   Preferred imaging location?    Answer:   [facility]   All questions were answered. The patient knows to call the clinic with any problems, questions or concerns. No barriers to learning was detected. The total time spent in the appointment was 30 minutes.     [name], MD 08/16/2021   I, Wilburn Mylar, am acting as scribe for [name], MD.   I have reviewed the above documentation for accuracy and completeness, and I agree with the above.

## 2021-08-12 NOTE — Patient Instructions (Signed)
[facility]ICAL [facility]COL[facility]  Discharge Instructions: °Thank you for choosing [facility] to provide your oncology and hematology care.  ° °If you have a lab appointment with the Cancer Center, please go directly to the Cancer Center and check in at the registration area. °  °Wear comfortable clothing and clothing appropriate for easy access to any Portacath or PICC line.  ° °We strive to give you quality time with your provider. You may need to reschedule your appointment if you arrive late (15 or more minutes).  Arriving late affects you and other patients whose appointments are after yours.  Also, if you miss three or more appointments without notifying the office, you may be dismissed from the clinic at the provider’s discretion.    °  °For prescription refill requests, have your pharmacy contact our office and allow 72 hours for refills to be completed.   ° °Today you received the following chemotherapy and/or immunotherapy agents: Bevacizumab.     °  °To help prevent nausea and vomiting after your treatment, we encourage you to take your nausea medication as directed. ° °BELOW ARE SYMPTOMS THAT SHOULD BE REPORTED IMMEDIATELY: °*FEVER GREATER THAN 100.4 F (38 °C) OR HIGHER °*CHILLS OR SWEATING °*NAUSEA AND VOMITING THAT IS NOT CONTROLLED WITH YOUR NAUSEA MEDICATION °*UNUSUAL SHORTNESS OF BREATH °*UNUSUAL BRUISING OR BLEEDING °*URINARY PROBLEMS (pain or burning when urinating, or frequent urination) °*BOWEL PROBLEMS (unusual diarrhea, constipation, pain near the anus) °TENDERNESS IN MOUTH AND THROAT WITH OR WITHOUT PRESENCE OF ULCERS (sore throat, sores in mouth, or a toothache) °UNUSUAL RASH, SWELLING OR PAIN  °UNUSUAL VAGINAL DISCHARGE OR ITCHING  ° °Items with * indicate a potential emergency and should be followed up as soon as possible or go to the Emergency Department if any problems should occur. ° °Please show the CHEMOTHERAPY ALERT CARD or IMMUNOTHERAPY ALERT CARD at check-in  to the Emergency Department and triage nurse. ° °Should you have questions after your visit or need to cancel or reschedule your appointment, please contact [facility]  Dept: [phone]  and follow the prompts.  Office hours are 8:00 a.m. to 4:30 p.m. Monday - Friday. Please note that voicemails left after 4:00 p.m. may not be returned until the following business day.  We are closed weekends and major holidays. You have access to a nurse at all times for urgent questions. Please call the main number to the clinic Dept: [phone] and follow the prompts. ° ° °For any non-urgent questions, you may also contact your provider using MyChart. We now offer e-Visits for anyone 18 and older to request care online for non-urgent symptoms. For details visit [email or web address]. °  °Also download the MyChart app! Go to the app store, search "MyChart", open the app, select [facility], and log in with your MyChart username and password. ° °Due to Covid, a mask is required upon entering the hospital/clinic. If you do not have a mask, one will be given to you upon arrival. For doctor visits, patients may have 1 support person aged 18 or older with them. For treatment visits, patients cannot have anyone with them due to current Covid guidelines and our immunocompromised population.  ° °

## 2021-08-13 ENCOUNTER — Telehealth: Payer: Self-pay | Admitting: Hematology

## 2021-08-13 ENCOUNTER — Encounter: Payer: Self-pay | Admitting: Hematology

## 2021-08-13 ENCOUNTER — Other Ambulatory Visit (HOSPITAL_COMMUNITY): Payer: Self-pay

## 2021-08-13 ENCOUNTER — Telehealth: Payer: Self-pay | Admitting: Pharmacist

## 2021-08-13 NOTE — Telephone Encounter (Signed)
Oral Oncology Pharmacist Encounter ? ?Received prescription for Xeloda (capecitabine) for re-initiation for the treatment of metastatic colon cancer in conjunction with bevacizumab, planned duration until disease progression or unacceptable drug toxicity. ? ?CBC w/ Diff and CMP from 08/12/21 assessed, no relevant lab abnormalities noted. Prescription dose and frequency assessed for appropriateness. Dose reduced due to previous history of side effects from Xeloda.  ? ?Current medication list in Epic reviewed, DDIs with Xeloda identified: ?Category C DDI between Xeloda and Ondansetron due to risk of Qtc prolongation with fluorouracil products. Noted patient only taking PRN and PO route, risk higher with IV administration. No change in therapy warranted at this time.  ? ?Evaluated chart and no patient barriers to medication adherence noted.  ? ?Patient agreement for treatment documented in MD note on 08/12/21. ? ?Prescription has been e-scribed to [employer] due to insurance restrictions. Prior authorization will still need to be completed as previous PA on Xeloda expired 05/2021. ? ?Oral Oncology Clinic will continue to follow for insurance authorization, copayment issues, initial counseling and start date. ? ?Leron Croak, PharmD, BCPS ?Hematology/Oncology Clinical Pharmacist ?[facility] Oral Chemotherapy Navigation Clinics ?[phone] ?08/13/2021 8:27 AM ? ?

## 2021-08-13 NOTE — Telephone Encounter (Signed)
Left message with follow-up appointment per 3/8 los. ?

## 2021-08-14 NOTE — Telephone Encounter (Signed)
Oral Chemotherapy Pharmacist Encounter ? ?I spoke with patient for re-education of: Xeloda (capecitabine) for the  treatment of metastatic colon cancer in conjunction with bevacizumab, planned duration until disease progression or unacceptable drug toxicity. ? ?Reviewed with patient administration, dosing, side effects, monitoring, drug-food interactions, safe handling, storage, and disposal. ? ?Patient will take Xeloda '500mg'$  tablets, 3 tablets ('1500mg'$ ) by mouth in AM and 3 tabs ('1500mg'$ ) by mouth in PM, within 30 minutes of finishing meals, for 14 days on, 7 days off, repeated every 21 days. ? ?Xeloda start date: 08/13/21 PM ? ?Adverse effects include but are not limited to: fatigue, decreased blood counts, GI upset, diarrhea, mouth sores, and hand-foot syndrome. ? ?Patient has anti-emetic on hand and knows to take it if nausea develops.   ?Patient has anti diarrheal on hand and alert the office of 4 or more loose stools above baseline. ? ?Reviewed with patient importance of keeping a medication schedule and plan for any missed doses. No barriers to medication adherence identified. ? ?Medication reconciliation performed and medication/allergy list updated. ? ?Medication is required to be filled through [employer]. ? ?All questions answered. ? ?Dave Vaughn voiced understanding and appreciation.  ? ?Medication education handout placed in mail for patient. Patient knows to call the office with questions or concerns. Oral Chemotherapy Clinic phone number provided to patient.  ? ?Leron Croak, PharmD, BCPS ?Hematology/Oncology Clinical Pharmacist ?[facility] Oral Chemotherapy Navigation Clinics ?[phone] ?08/14/2021 9:36 AM ? ?

## 2021-08-16 ENCOUNTER — Encounter: Payer: Self-pay | Admitting: Hematology

## 2021-08-17 ENCOUNTER — Ambulatory Visit (INDEPENDENT_AMBULATORY_CARE_PROVIDER_SITE_OTHER): Payer: Commercial Managed Care - PPO | Admitting: Cardiology

## 2021-08-17 ENCOUNTER — Other Ambulatory Visit: Payer: Self-pay

## 2021-08-17 ENCOUNTER — Encounter: Payer: Self-pay | Admitting: Cardiology

## 2021-08-17 VITALS — BP 138/70 | HR 68 | Ht 70.0 in | Wt 222.8 lb

## 2021-08-17 DIAGNOSIS — N1831 Chronic kidney disease, stage 3a: Secondary | ICD-10-CM | POA: Diagnosis not present

## 2021-08-17 DIAGNOSIS — I4821 Permanent atrial fibrillation: Secondary | ICD-10-CM | POA: Diagnosis not present

## 2021-08-17 DIAGNOSIS — I482 Chronic atrial fibrillation, unspecified: Secondary | ICD-10-CM

## 2021-08-17 DIAGNOSIS — C186 Malignant neoplasm of descending colon: Secondary | ICD-10-CM

## 2021-08-17 DIAGNOSIS — Z7901 Long term (current) use of anticoagulants: Secondary | ICD-10-CM | POA: Diagnosis not present

## 2021-08-17 MED ORDER — APIXABAN 5 MG PO TABS: 5.0000 mg | ORAL_TABLET | Freq: Two times a day (BID) | ORAL | 6 refills | Status: DC

## 2021-08-17 NOTE — Progress Notes (Signed)
Cardiology Office Note:    Date:  08/17/2021   ID:  Dave Vaughn, DOB 22-Sep-1945, MRN [id number]  PCP:  [name], MD   [facility] Providers Cardiologist:  None     Referring MD: [name], MD    History of Present Illness:    Dave Vaughn is a 76 y.o. male here for the follow-up of persistent atrial fibrillation.  Since we last saw each other, in January 2023 he was admitted with a strangulated hernia, post repair.  He states he has had 3 surgeries, all stable now.  Has been on diltiazem for optimization of rate control.  Doing well.  Has been treated for cancer of the left colon adenocarcinoma Dr. Burr Medico.  Echocardiogram shows EF of 55 to 60% with moderate mitral regurgitation moderately dilated left atrium.  Eliquis is on hold, Dr. [name] stopped months ago he states.  He has not had any recent bleeding.  No fevers chills nausea vomiting syncope bleeding.  No chest pain.  No strokelike symptoms.  Past Medical History:  Diagnosis Date   Adenocarcinoma, colon ([place]) dx'd 01/2018   Anemia    taking iron supplements   Anxiety    Atrial fibrillation with RVR (HCC)    Colonic obstruction ([place]) 01/10/2018   Depression    Diverticulitis    Dysrhythmia    afib   History of kidney stones    Hypertension    Indwelling Foley catheter present    due to UTI    Past Surgical History:  Procedure Laterality Date   ANKLE SURGERY Left    when he was in college   COLON RESECTION N/A 01/11/2018   Procedure: LEFT COLON RESECTION, TAKEDOWN SPLENIC FLEXURE, COLOSTOMY;  Surgeon: [name], MD;  Location: [facility];  Service: General;  Laterality: N/A;   COLONOSCOPY  01/11/2018   Procedure: COLONOSCOPY;  Surgeon: Jackquline Denmark, MD;  Location: [facility];  Service: Endoscopy;;   COLONOSCOPY  05/10/2018   colonscopy  05/10/2018   EXCISION OF KELOID N/A 05/05/2021   Procedure: ABDOMINAL SCAR EXCISION;  Surgeon: Ralene Ok, MD;  Location: [place];  Service:  General;  Laterality: N/A;   FOREIGN BODY REMOVAL ABDOMINAL N/A 03/09/2021   Procedure: EXCISION OF FOREIGN BODY;  Surgeon: Ralene Ok, MD;  Location: [place];  Service: General;  Laterality: N/A;   HERNIA REPAIR     HERNIA REPAIR  03/02/2019   EXPLORATORY LAPAROTOMY (N/A Abdomen)   INCISIONAL HERNIA REPAIR N/A 03/02/2019   Procedure: INCISIONAL HERNIA REPAIR , RECTORECTUS VS TAR HERNIA REPAIR;  Surgeon: Ralene Ok, MD;  Location: [place];  Service: General;  Laterality: N/A;   INSERTION OF MESH N/A 03/02/2019   Procedure: Insertion Of Mesh;  Surgeon: Ralene Ok, MD;  Location: [place];  Service: General;  Laterality: N/A;   LAPAROTOMY N/A 03/02/2019   Procedure: EXPLORATORY LAPAROTOMY;  Surgeon: Ralene Ok, MD;  Location: [place];  Service: General;  Laterality: N/A;   LAPAROTOMY N/A 03/09/2021   Procedure: EXPLORATION OF ABDOMINAL WALL;  Surgeon: Ralene Ok, MD;  Location: [place];  Service: General;  Laterality: N/A;   LYSIS OF ADHESION N/A 06/12/2018   Procedure: LYSIS OF ADHESIONS;  Surgeon: [name], MD;  Location: [facility];  Service: General;  Laterality: N/A;   LYSIS OF ADHESION N/A 03/02/2019   Procedure: Lysis Of Adhesion;  Surgeon: Ralene Ok, MD;  Location: [place];  Service: General;  Laterality: N/A;   PORTACATH PLACEMENT Right 03/07/2018   Procedure: INSERTION  PORT-A-CATH RIGHT SUBCLAVIAN;  Surgeon: [name], MD;  Location: [place];  Service: General;  Laterality: Right;   PROCTOSCOPY N/A 06/12/2018   Procedure: RIGID PROCTOSCOPY;  Surgeon: [name], MD;  Location: [facility];  Service: General;  Laterality: N/A;   thumb surgery   2018   cyst removal    Current Medications: Current Meds  Medication Sig   b complex vitamins tablet Take 1 tablet by mouth daily.   capecitabine (XELODA) 500 MG tablet Take 3 tabs every 12 hours after a meal, for 14 days then off 7 days   diltiazem (CARDIZEM CD) 360 MG 24 hr capsule TAKE 1 CAPSULE(360 MG) BY MOUTH DAILY    finasteride (PROSCAR) 5 MG tablet Take 5 mg by mouth daily.   levothyroxine (SYNTHROID) 75 MCG tablet TAKE 1 TABLET(75 MCG) BY MOUTH DAILY   lidocaine-prilocaine (EMLA) cream Apply 1 application topically as needed.   Multiple Vitamins-Iron (MULTIVITAMIN/IRON PO) Take 1 tablet by mouth daily.    tamsulosin (FLOMAX) 0.4 MG CAPS capsule Take 0.4 mg by mouth 2 (two) times daily.    [DISCONTINUED] apixaban (ELIQUIS) 5 MG TABS tablet Take 1 tablet (5 mg total) by mouth 2 (two) times daily.     Allergies:   Patient has no known allergies.   Social History   Socioeconomic History   Marital status: Married    Spouse name: Not on file   Number of children: Not on file   Years of education: Not on file   Highest education level: Not on file  Occupational History   Not on file  Tobacco Use   Smoking status: Never   Smokeless tobacco: Former    Types: Chew    Quit date: 10/22/2019  Vaping Use   Vaping Use: Never used  Substance and Sexual Activity   Alcohol use: Yes    Alcohol/week: 2.0 standard drinks    Types: 1 Cans of beer, 1 Shots of liquor per week    Comment: sometimes   Drug use: Never   Sexual activity: Not on file  Other Topics Concern   Not on file  Social History Narrative   Not on file   Social Determinants of Health   Financial Resource Strain: Not on file  Food Insecurity: Not on file  Transportation Needs: Not on file  Physical Activity: Not on file  Stress: Not on file  Social Connections: Not on file     Family History: The patient's family history includes Breast cancer (age of onset: 71) in his mother; Cancer in his daughter; Heart attack (age of onset: 47) in his father. There is no history of Esophageal cancer, Colon cancer, Rectal cancer, or Ulcerative colitis.  ROS:   Please see the history of present illness.     All other systems reviewed and are negative.  EKGs/Labs/Other Studies Reviewed:    The following studies were reviewed today: Echo  as above normal EF  EKG:  EKG is  ordered today.  The ekg ordered today demonstrates 68 bpm atrial fibrillation  Recent Labs: 08/12/2021: ALT 15; BUN 19; Creatinine 0.99; Hemoglobin 13.6; Platelet Count 190; Potassium 4.1; Sodium 138  Recent Lipid Panel No results found for: CHOL, TRIG, HDL, CHOLHDL, VLDL, LDLCALC, LDLDIRECT   Risk Assessment/Calculations:              Physical Exam:    VS:  BP 138/70 (BP Location: Left Arm, Patient Position: Sitting, Cuff Size: Normal)    Pulse 68    Ht 5'  10" (1.778 m)    Wt 222 lb 12.8 oz (101.1 kg)    SpO2 97%    BMI 31.97 kg/m     Wt Readings from Last 3 Encounters:  08/17/21 222 lb 12.8 oz (101.1 kg)  08/12/21 228 lb 11.2 oz (103.7 kg)  06/11/21 228 lb 4.8 oz (103.6 kg)     GEN:  Well nourished, well developed in no acute distress HEENT: Normal NECK: No JVD; No carotid bruits LYMPHATICS: No lymphadenopathy CARDIAC: Irregularly irregular normal rate, no murmurs, no rubs, gallops RESPIRATORY:  Clear to auscultation without rales, wheezing or rhonchi  ABDOMEN: Soft, non-tender, non-distended MUSCULOSKELETAL:  No edema; No deformity  SKIN: Warm and dry NEUROLOGIC:  Alert and oriented x 3 PSYCHIATRIC:  Normal affect   ASSESSMENT:    1. Permanent atrial fibrillation ([place])   2. Chronic atrial fibrillation   3. Stage 3a chronic kidney disease ([place])   4. Chronic anticoagulation   5. Cancer of left colon ([place])    PLAN:    In order of problems listed above:  Chronic atrial fibrillation Very well rate controlled on diltiazem CD3 160 mg a day.  Heart rate 68.  No syncope.  EKG reviewed.  We will go ahead and restart his Eliquis.  He states that he has been off of this for quite some time.  We will go ahead and restart it.  I reviewed Dr. Ernestina Penna note and they were comfortable with him being on this medication.  Obviously if bleeding does occur, he may need to stop.  CKD (chronic kidney disease) stage 3, GFR 30-59 ml/min (HCC) His team is  monitoring closely.  Last creatinine actually 0.9.  Previously 1.37.  Avoiding NSAIDs.  Chronic anticoagulation Eliquis will be restarted as above for stroke prophylaxis.  Obviously if bleeding does occur, he will need to stop.  Hemoglobin currently 13.6  Cancer of left colon (HCC) He is back on oral chemotherapy.  Oncology notes reviewed.         Medication Adjustments/Labs and Tests Ordered: Current medicines are reviewed at length with the patient today.  Concerns regarding medicines are outlined above.  Orders Placed This Encounter  Procedures   EKG 12-Lead   Meds ordered this encounter  Medications   apixaban (ELIQUIS) 5 MG TABS tablet    Sig: Take 1 tablet (5 mg total) by mouth 2 (two) times daily.    Dispense:  60 tablet    Refill:  6    Patient Instructions  Medication Instructions:  Please start Eliquis 5 mg twice a day. Continue all other medications as listed.  *If you need a refill on your cardiac medications before your next appointment, please call your pharmacy*  Follow-Up: At [facility], you and your health needs are our priority.  As part of our continuing mission to provide you with exceptional heart care, we have created designated Provider Care Teams.  These Care Teams include your primary Cardiologist (physician) and Advanced Practice Providers (APPs -  Physician Assistants and Nurse Practitioners) who all work together to provide you with the care you need, when you need it.  We recommend signing up for the patient portal called "MyChart".  Sign up information is provided on this After Visit Summary.  MyChart is used to connect with patients for Virtual Visits (Telemedicine).  Patients are able to view lab/test results, encounter notes, upcoming appointments, etc.  Non-urgent messages can be sent to your provider as well.   To learn more about what you  can do with MyChart, go to [email or web address].    Your next appointment:   1 year(s)  The  format for your next appointment:   In Person  Provider:   Dr Candee Furbish  Thank you for choosing [facility]!!      Signed, Candee Furbish, MD  08/17/2021 9:30 AM    [place]

## 2021-08-17 NOTE — Assessment & Plan Note (Signed)
His team is monitoring closely.  Last creatinine actually 0.9.  Previously 1.37.  Avoiding NSAIDs. ?

## 2021-08-17 NOTE — Assessment & Plan Note (Signed)
Eliquis will be restarted as above for stroke prophylaxis.  Obviously if bleeding does occur, he will need to stop.  Hemoglobin currently 13.6 ?

## 2021-08-17 NOTE — Assessment & Plan Note (Signed)
He is back on oral chemotherapy.  Oncology notes reviewed. ?

## 2021-08-17 NOTE — Assessment & Plan Note (Signed)
Very well rate controlled on diltiazem CD3 160 mg a day.  Heart rate 68.  No syncope.  EKG reviewed.  We will go ahead and restart his Eliquis.  He states that he has been off of this for quite some time.  We will go ahead and restart it.  I reviewed Dr. Ernestina Penna note and they were comfortable with him being on this medication.  Obviously if bleeding does occur, he may need to stop. ?

## 2021-08-17 NOTE — Patient Instructions (Signed)
Medication Instructions:  ?Please start Eliquis 5 mg twice a day. ?Continue all other medications as listed. ? ?*If you need a refill on your cardiac medications before your next appointment, please call your pharmacy* ? ?Follow-Up: ?At [facility], you and your health needs are our priority.  As part of our continuing mission to provide you with exceptional heart care, we have created designated Provider Care Teams.  These Care Teams include your primary Cardiologist (physician) and Advanced Practice Providers (APPs -  Physician Assistants and Nurse Practitioners) who all work together to provide you with the care you need, when you need it. ? ?We recommend signing up for the patient portal called "MyChart".  Sign up information is provided on this After Visit Summary.  MyChart is used to connect with patients for Virtual Visits (Telemedicine).  Patients are able to view lab/test results, encounter notes, upcoming appointments, etc.  Non-urgent messages can be sent to your provider as well.   ?To learn more about what you can do with MyChart, go to [email or web address].   ? ?Your next appointment:   ?1 year(s) ? ?The format for your next appointment:   ?In Person ? ?Provider:   ?Dr Candee Furbish ? ?Thank you for choosing [facility]!! ? ? ? ?

## 2021-08-26 ENCOUNTER — Ambulatory Visit (HOSPITAL_COMMUNITY): Payer: Commercial Managed Care - PPO

## 2021-08-26 ENCOUNTER — Telehealth: Payer: Self-pay

## 2021-08-26 NOTE — Telephone Encounter (Signed)
This nurse reached out to patient related to rescheduling CT scan.  Patient did not answer.  This nurse will send a message to patient on My Chart.  No concerns at this time.   ?

## 2021-08-27 ENCOUNTER — Telehealth: Payer: Self-pay | Admitting: Hematology

## 2021-08-27 NOTE — Telephone Encounter (Signed)
.  Called pt per 3/23 inbasket , Patient was unavailable, a message with appt time and date was left with number on file.  ? ?

## 2021-08-31 ENCOUNTER — Ambulatory Visit
Admission: RE | Admit: 2021-08-31 | Discharge: 2021-08-31 | Disposition: A | Discharge status: Home / Self Care | Payer: Self-pay | Source: Ambulatory Visit | Attending: Hematology | Admitting: Hematology

## 2021-08-31 ENCOUNTER — Inpatient Hospital Stay (HOSPITAL_BASED_OUTPATIENT_CLINIC_OR_DEPARTMENT_OTHER): Payer: Commercial Managed Care - PPO | Admitting: Hematology

## 2021-08-31 ENCOUNTER — Encounter: Payer: Self-pay | Admitting: Hematology

## 2021-08-31 ENCOUNTER — Other Ambulatory Visit: Payer: Self-pay

## 2021-08-31 ENCOUNTER — Inpatient Hospital Stay: Payer: Commercial Managed Care - PPO

## 2021-08-31 VITALS — BP 146/87 | HR 82 | Temp 98.6°F | Resp 17 | Wt 229.1 lb

## 2021-08-31 DIAGNOSIS — C186 Malignant neoplasm of descending colon: Secondary | ICD-10-CM | POA: Diagnosis not present

## 2021-08-31 DIAGNOSIS — C786 Secondary malignant neoplasm of retroperitoneum and peritoneum: Secondary | ICD-10-CM | POA: Diagnosis not present

## 2021-08-31 DIAGNOSIS — Z5112 Encounter for antineoplastic immunotherapy: Secondary | ICD-10-CM | POA: Diagnosis not present

## 2021-08-31 DIAGNOSIS — C787 Secondary malignant neoplasm of liver and intrahepatic bile duct: Secondary | ICD-10-CM | POA: Diagnosis not present

## 2021-08-31 DIAGNOSIS — C7800 Secondary malignant neoplasm of unspecified lung: Secondary | ICD-10-CM | POA: Diagnosis not present

## 2021-08-31 DIAGNOSIS — N183 Chronic kidney disease, stage 3 unspecified: Secondary | ICD-10-CM | POA: Diagnosis not present

## 2021-08-31 DIAGNOSIS — I4891 Unspecified atrial fibrillation: Secondary | ICD-10-CM | POA: Diagnosis not present

## 2021-08-31 DIAGNOSIS — Z95828 Presence of other vascular implants and grafts: Secondary | ICD-10-CM

## 2021-08-31 DIAGNOSIS — I129 Hypertensive chronic kidney disease with stage 1 through stage 4 chronic kidney disease, or unspecified chronic kidney disease: Secondary | ICD-10-CM | POA: Diagnosis not present

## 2021-08-31 DIAGNOSIS — Z7901 Long term (current) use of anticoagulants: Secondary | ICD-10-CM | POA: Diagnosis not present

## 2021-08-31 DIAGNOSIS — E039 Hypothyroidism, unspecified: Secondary | ICD-10-CM | POA: Diagnosis not present

## 2021-08-31 LAB — CBC WITH DIFFERENTIAL (CANCER CENTER ONLY)
Abs Immature Granulocytes: 0.02 10*3/uL (ref 0.00–0.07)
Basophils Absolute: 0 10*3/uL (ref 0.0–0.1)
Basophils Relative: 1 %
Eosinophils Absolute: 0.1 10*3/uL (ref 0.0–0.5)
Eosinophils Relative: 2 %
HCT: 41.3 % (ref 39.0–52.0)
Hemoglobin: 13.7 g/dL (ref 13.0–17.0)
Immature Granulocytes: 0 %
Lymphocytes Relative: 33 %
Lymphs Abs: 1.9 10*3/uL (ref 0.7–4.0)
MCH: 30.3 pg (ref 26.0–34.0)
MCHC: 33.2 g/dL (ref 30.0–36.0)
MCV: 91.4 fL (ref 80.0–100.0)
Monocytes Absolute: 0.5 10*3/uL (ref 0.1–1.0)
Monocytes Relative: 8 %
Neutro Abs: 3.2 10*3/uL (ref 1.7–7.7)
Neutrophils Relative %: 56 %
Platelet Count: 186 10*3/uL (ref 150–400)
RBC: 4.52 MIL/uL (ref 4.22–5.81)
RDW: 15.5 % (ref 11.5–15.5)
WBC Count: 5.8 10*3/uL (ref 4.0–10.5)
nRBC: 0 % (ref 0.0–0.2)

## 2021-08-31 LAB — CMP (CANCER CENTER ONLY)
ALT: 15 U/L (ref 0–44)
AST: 22 U/L (ref 15–41)
Albumin: 4 g/dL (ref 3.5–5.0)
Alkaline Phosphatase: 91 U/L (ref 38–126)
Anion gap: 5 (ref 5–15)
BUN: 21 mg/dL (ref 8–23)
CO2: 29 mmol/L (ref 22–32)
Calcium: 9.2 mg/dL (ref 8.9–10.3)
Chloride: 105 mmol/L (ref 98–111)
Creatinine: 1.06 mg/dL (ref 0.61–1.24)
GFR, Estimated: >60 mL/min (ref >60)
Glucose, Bld: 97 mg/dL (ref 70–99)
Potassium: 4.1 mmol/L (ref 3.5–5.1)
Sodium: 139 mmol/L (ref 135–145)
Total Bilirubin: 0.7 mg/dL (ref 0.3–1.2)
Total Protein: 6.8 g/dL (ref 6.5–8.1)

## 2021-08-31 LAB — TOTAL PROTEIN, URINE DIPSTICK: Protein, ur: 30 mg/dL — AB

## 2021-08-31 LAB — CEA (IN HOUSE-CHCC): CEA (CHCC-In House): 11.61 ng/mL — ABNORMAL HIGH (ref 0.00–5.00)

## 2021-08-31 MED ORDER — SODIUM CHLORIDE 0.9 % IV SOLN: Freq: Once | INTRAVENOUS | Status: AC

## 2021-08-31 MED ORDER — SODIUM CHLORIDE 0.9% FLUSH
10.0000 mL | INTRAVENOUS | Status: DC | PRN
  Administered 2021-08-31: 10 mL

## 2021-08-31 MED ORDER — HEPARIN SOD (PORK) LOCK FLUSH 100 UNIT/ML IV SOLN
500.0000 [IU] | Freq: Once | INTRAVENOUS | Status: AC | PRN
  Administered 2021-08-31: 500 [IU]

## 2021-08-31 MED ORDER — SODIUM CHLORIDE 0.9 % IV SOLN
7.5000 mg/kg | Freq: Once | INTRAVENOUS | Status: AC
  Administered 2021-08-31: 800 mg via INTRAVENOUS
  Filled 2021-08-31: qty 32

## 2021-08-31 NOTE — Addendum Note (Signed)
Addended by: [name] on: 08/31/2021 03:16 PM ? ? Modules accepted: Orders ? ?

## 2021-08-31 NOTE — Progress Notes (Signed)
?[place]   ?[phone] [phone]   ?Clinic Follow up Note  ? ?Patient Care Team: ?[name], MD as PCP - General (Family Medicine) ?[name], MD as Consulting Physician (Cardiology) ?[name], MD as Consulting Physician (Gastroenterology) ?[name], MD as Consulting Physician (General Surgery) ?[name], MD as Consulting Physician (General Surgery) ?[name], MD as Consulting Physician (Urology) ?[name], MD as Consulting Physician (Medical Oncology) ? ?Date of Service:  08/31/2021 ? ?CHIEF COMPLAINT: f/u of metastatic colon cancer ? ?CURRENT THERAPY:  ?Bevacizumab/Xeloda restarted 08/12/21 ? -Xeloda dose: 1529m BID, days 1-14 q21days ? ?ASSESSMENT & PLAN:  ?Dave KOZAKis a 76y.o. male with  ? ?1. Cancer of left colon, adenocarcinoma, stage IIIB (pT3N1cM0), MSI-stable, KRAS Q61H mutation (+), liver and peritoneal metastasis in 12/2019  ?-Diagnosed in 01/2018. Treated with surgery and adjuvant FOLFOX. Due to side effects Oxaliplatin was stopped after 3 cycles and chemo was stopped after 6 cycles. He declined additional treatment. ?-unfortunately, he developed local recurrence in left abdominal wall and peritoneal metastasis, 2 hypermetabolic liver lets in 76/9678 Abdominal wall biopsy conformed metastatic colorectal adenocarcinoma in 01/2020  ?-Began first line dose-reduced FOLFIRI on 01/28/20 ?-Foundation One showed Kras (+) positive, he is not a candidate for EGFR inhibitor.  Bevacizumab was added with cycle 2  ?-S/p 8 cycles of FOLFIRI and bevacizumab from cycle 2; tolerated well with fatigue, nausea, diarrhea. CEA normalized after cycle 4, then switched to maintenance xeloda and bevacizumab  ?-Cycle 2 Xeloda reduced due to skin toxicity (skin rash to his forearms and sensitivity to his feet) ?-beva was held since 01/12/21 due to high proteinuria and recent surgeries ?-he went to the ED on 04/06/21 with hematuria. CT  renal stone protocol showed new intramuscular hyperdensity at level of prior ostomy, indeterminate. ?-he underwent abdominal scar excision on 05/05/21 under Dr. RRosendo Gros Pathology revealed adenocarcinoma. ?-CT CAP on 06/02/21 showed progressive disease in lung, liver, colon, and abdominal wall. ?-He opted to restart CAPOX. He began Xeloda, 1500 mg BID 2 weeks on/1 week off, on 05/20/21 and started oxaliplatin and bevacizumab on 06/11/21. He tolerated infusion poorly with a number of side effects but was able to recover. ?-he had CT [place] on [place] showing: no recurrent hernia, stable indeterminate 3 cm ventral abdominal wall mass, bibasilar lung nodules, and right hepatic lesions. We are planning for chest CT to be done soon to complete restaging. ?-he restarted bevacizumab on 08/12/21 and Xeloda on 08/13/21. He is tolerating well. ?-labs reviewed, urine protein 30, CBC and CMP are WNL. Will proceed with beva today and he will continue xeloda  ?  ?2. Abdominal hernia with bowel obstruction ?-required emergent surgery on 06/16/21 while in [place] due to hernia strangulation. ?-he underwent hernia repair as well as small bowel resection. Pathology was negative for malignancy (results available in Care Everywhere). ?  ?3. Hypothyroidism ?-Previously on synthroid, TSH normal 2 years ago. ?-TSH in 02/2020 up to 67, he went back on Synthroid since 02/16/20 ?  ?4. BPH  ?-Followed by urologist Dr. WLovena Neighbours?-Pelvic MRI on 12/05/20 shows marked prostatomegaly with signs of BPH extending into the bladder base.  ?-Denies new or worsening urinary symptoms ?  ?5. Atrial Fibrillation ?-continue Eliquis. Rate controlled.  He can stop Eliquis periodically for bleeding hemorrhoids ?-Continue follow-up with cardiology ?  ?6. CKD stage III ?-he has developed mild increased Cr since he started chemo, EGFR around 40-50's  ?-I encouraged  him to control his blood pressure, cholesterol and BG. I also encouraged him to drink plenty  of water.  ?-Will avoid NSAIDs and CT IV contrast if GFR low ?-Improved today ?  ?  ?PLAN: ?-proceed with bevacizumab today ?-continue Xeloda, he will start this cycle on 3/29  ?-chest CT to be done soon, I gave him radiology number to call and reschedule  ?-lab, flush, f/u, and beva in 3 weeks ? ? ?No problem-specific Assessment & Plan notes found for this encounter. ? ? ?SUMMARY OF ONCOLOGIC HISTORY: ?Oncology History Overview Note  ? Cancer Staging  ?Cancer of left colon ([place]) ?Staging form: Colon and Rectum, AJCC 8th Edition ?- Pathologic stage from 01/11/2018: Stage IIIB (pT3, pN1c, cM0) - Signed by [name], MD on 01/16/2018 ?Total positive nodes: 0 ?Histologic grading system: 4 grade system ?Histologic grade (G): G2 ? ? ?  ?Cancer of left colon ([place])  ?01/10/2018 Imaging  ? CT AP W Contrast 01/10/18  ?IMPRESSION: ?Irregular soft tissue density causing stricture of the mid ?descending colon likely the site of obstruction for the dilated ?small bowel. 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 No evidence of ?perforation. ?  ?Equivocal findings involving the appendix measuring 1.2 cm at the ?appendiceal tip with mucosal enhancement. No adjacent free fluid or inflammatory change. Findings are nonspecific, but can be seen with early acute appendicitis. ?  ?Mild prostatic enlargement. Increased density over the posterior ?bladder base likely due to the large prostatic impression although ?cannot completely exclude a bladder mass. Urology protocol CT or ?ultrasound may be helpful for better evaluation. ?  ?Mild cholelithiasis. ?  ?Stable 1.5 cm cystic structure over the lower pole right kidney ?likely slightly hyperdense cyst. ?  ?Diverticulosis of the colon. ?  ?Aortic Atherosclerosis (ICD10-I70.0). ?  ?01/11/2018 Cancer Staging  ? Staging form: Colon and Rectum, AJCC 8th Edition ?- Pathologic stage from 01/11/2018: Stage IIIB (pT3, pN1c, cM0) - Signed by [name], MD on 01/16/2018 ? ?  ?01/11/2018 Surgery  ? LEFT COLON RESECTION,  TAKEDOWN SPLENIC FLEXURE, COLOSTOMY by Dr. [name]  ?  ?01/11/2018 Procedure  ? Colonoscopy 01/11/18 by Dr. [name]  ?- Malignant completely obstructing tumor in the mid descending colon. Tattooed. ?- Diverticulosis in the sigmoid colon. ?- Non-bleeding internal hemorrhoids. ?- No specimens collected. ?  ?01/11/2018 Pathology Results  ? Diagnosis 01/11/18  ?1. Colon, segmental resection for tumor, descending colon ?- INVASIVE COLORECTAL ADENOCARCINOMA, 4 CM. ?- TUMOR EXTENDS INTO PERICOLONIC CONNECTIVE TISSUE. ?- TUMOR FOCALLY INVOLVES RADIAL MARGIN. ?- ONE MESENTERIC TUMOR DEPOSIT. ?- THIRTEEN BENIGN LYMPH NODES (0/13). ?2. Colon, segmental resection, splenic flexure ?- BENIGN COLON. ?- NO EVIDENCE OF MALIGNANCY . ?  ?01/11/2018 Tumor Marker  ? Baseline CEA at 3.4 ?  ?01/16/2018 Initial Diagnosis  ? Cancer of left colon [facility]) ?  ?01/23/2018 Imaging  ? CT CHEST WO CONTRAST IMPRESSION: ?1. No evidence for metastatic disease within the chest. ?2. Small left pleural effusion with underlying opacities which may ?represent atelectasis. Right basilar atelectasis. ?3. Few foci of gas within the upper abdomen in the omentum with ?surrounding fat stranding, likely postsurgical ?4. Aortic Atherosclerosis (ICD10-I70.0). ?  ?03/08/2018 - 05/15/2018 Chemotherapy  ? adjuvant FOLOFX. Due to side effects of neuropahty Oxaliplatin was stopped after 3 cycles and chemo was stopped after 6 cycles. He declined completing 6 months of chemo treatment.  ? ?  ?03/19/2018 Imaging  ? 03/19/2018 CT AP ?IMPRESSION: ?1. Interval partial left hemicolectomy and descending colostomy. ?2. Heterogeneous soft tissue density along the left anterior  renal ?fascia is most likely postoperative (favor fat necrosis). No ?well-defined fluid collection. ?3. Mild left lower quadrant edema, new since 01/10/2018. This could ?be postoperative. Superimposed sigmoid diverticulitis and/or ?cystitis cannot be excluded. ?4. Subtle hyperenhancing nodule within the anterior bladder  dome ?cannot be excluded. Consider nonemergent cystoscopy. When this is ?performed, recommend attention to the left ureterovesicular junction ?and distal left ureter to evaluate questionable soft tissue ?ful

## 2021-09-02 ENCOUNTER — Inpatient Hospital Stay: Payer: Commercial Managed Care - PPO | Admitting: Hematology

## 2021-09-02 ENCOUNTER — Inpatient Hospital Stay: Payer: Commercial Managed Care - PPO

## 2021-09-14 ENCOUNTER — Encounter: Payer: Self-pay | Admitting: Hematology

## 2021-09-15 ENCOUNTER — Other Ambulatory Visit: Payer: Self-pay | Admitting: Hematology

## 2021-09-15 DIAGNOSIS — C186 Malignant neoplasm of descending colon: Secondary | ICD-10-CM

## 2021-09-16 ENCOUNTER — Ambulatory Visit (HOSPITAL_COMMUNITY)
Admission: RE | Admit: 2021-09-16 | Discharge: 2021-09-16 | Disposition: A | Discharge status: Home / Self Care | Payer: Commercial Managed Care - PPO | Source: Ambulatory Visit | Attending: Hematology | Admitting: Hematology

## 2021-09-16 DIAGNOSIS — C186 Malignant neoplasm of descending colon: Secondary | ICD-10-CM | POA: Diagnosis present

## 2021-09-21 ENCOUNTER — Other Ambulatory Visit: Payer: Self-pay

## 2021-09-21 ENCOUNTER — Telehealth: Payer: Self-pay

## 2021-09-21 ENCOUNTER — Inpatient Hospital Stay (HOSPITAL_BASED_OUTPATIENT_CLINIC_OR_DEPARTMENT_OTHER): Payer: Commercial Managed Care - PPO | Admitting: Hematology

## 2021-09-21 ENCOUNTER — Inpatient Hospital Stay: Payer: Commercial Managed Care - PPO

## 2021-09-21 ENCOUNTER — Inpatient Hospital Stay: Payer: Commercial Managed Care - PPO | Attending: Nurse Practitioner

## 2021-09-21 VITALS — BP 151/84 | HR 75 | Temp 98.6°F | Resp 18 | Ht 70.0 in | Wt 227.0 lb

## 2021-09-21 DIAGNOSIS — C787 Secondary malignant neoplasm of liver and intrahepatic bile duct: Secondary | ICD-10-CM | POA: Diagnosis not present

## 2021-09-21 DIAGNOSIS — I129 Hypertensive chronic kidney disease with stage 1 through stage 4 chronic kidney disease, or unspecified chronic kidney disease: Secondary | ICD-10-CM | POA: Diagnosis not present

## 2021-09-21 DIAGNOSIS — C186 Malignant neoplasm of descending colon: Secondary | ICD-10-CM

## 2021-09-21 DIAGNOSIS — E039 Hypothyroidism, unspecified: Secondary | ICD-10-CM | POA: Insufficient documentation

## 2021-09-21 DIAGNOSIS — Z5112 Encounter for antineoplastic immunotherapy: Secondary | ICD-10-CM | POA: Diagnosis present

## 2021-09-21 DIAGNOSIS — I4891 Unspecified atrial fibrillation: Secondary | ICD-10-CM | POA: Diagnosis not present

## 2021-09-21 DIAGNOSIS — N400 Enlarged prostate without lower urinary tract symptoms: Secondary | ICD-10-CM | POA: Diagnosis not present

## 2021-09-21 DIAGNOSIS — C786 Secondary malignant neoplasm of retroperitoneum and peritoneum: Secondary | ICD-10-CM | POA: Diagnosis not present

## 2021-09-21 DIAGNOSIS — N183 Chronic kidney disease, stage 3 unspecified: Secondary | ICD-10-CM | POA: Insufficient documentation

## 2021-09-21 DIAGNOSIS — Z95828 Presence of other vascular implants and grafts: Secondary | ICD-10-CM

## 2021-09-21 DIAGNOSIS — Z7901 Long term (current) use of anticoagulants: Secondary | ICD-10-CM | POA: Diagnosis not present

## 2021-09-21 LAB — CBC WITH DIFFERENTIAL (CANCER CENTER ONLY)
Abs Immature Granulocytes: 0.01 10*3/uL (ref 0.00–0.07)
Basophils Absolute: 0 10*3/uL (ref 0.0–0.1)
Basophils Relative: 1 %
Eosinophils Absolute: 0.2 10*3/uL (ref 0.0–0.5)
Eosinophils Relative: 3 %
HCT: 41.1 % (ref 39.0–52.0)
Hemoglobin: 14.1 g/dL (ref 13.0–17.0)
Immature Granulocytes: 0 %
Lymphocytes Relative: 31 %
Lymphs Abs: 1.7 10*3/uL (ref 0.7–4.0)
MCH: 31.5 pg (ref 26.0–34.0)
MCHC: 34.3 g/dL (ref 30.0–36.0)
MCV: 91.7 fL (ref 80.0–100.0)
Monocytes Absolute: 0.5 10*3/uL (ref 0.1–1.0)
Monocytes Relative: 9 %
Neutro Abs: 3.2 10*3/uL (ref 1.7–7.7)
Neutrophils Relative %: 56 %
Platelet Count: 201 10*3/uL (ref 150–400)
RBC: 4.48 MIL/uL (ref 4.22–5.81)
RDW: 16.1 % — ABNORMAL HIGH (ref 11.5–15.5)
WBC Count: 5.6 10*3/uL (ref 4.0–10.5)
nRBC: 0 % (ref 0.0–0.2)

## 2021-09-21 LAB — CMP (CANCER CENTER ONLY)
ALT: 15 U/L (ref 0–44)
AST: 21 U/L (ref 15–41)
Albumin: 3.9 g/dL (ref 3.5–5.0)
Alkaline Phosphatase: 91 U/L (ref 38–126)
Anion gap: 6 (ref 5–15)
BUN: 19 mg/dL (ref 8–23)
CO2: 29 mmol/L (ref 22–32)
Calcium: 8.8 mg/dL — ABNORMAL LOW (ref 8.9–10.3)
Chloride: 107 mmol/L (ref 98–111)
Creatinine: 1.17 mg/dL (ref 0.61–1.24)
GFR, Estimated: >60 mL/min (ref >60)
Glucose, Bld: 123 mg/dL — ABNORMAL HIGH (ref 70–99)
Potassium: 3.9 mmol/L (ref 3.5–5.1)
Sodium: 142 mmol/L (ref 135–145)
Total Bilirubin: 0.6 mg/dL (ref 0.3–1.2)
Total Protein: 6.6 g/dL (ref 6.5–8.1)

## 2021-09-21 LAB — TOTAL PROTEIN, URINE DIPSTICK: Protein, ur: 100 mg/dL — AB

## 2021-09-21 LAB — CEA (IN HOUSE-CHCC): CEA (CHCC-In House): 12.69 ng/mL — ABNORMAL HIGH (ref 0.00–5.00)

## 2021-09-21 MED ORDER — SODIUM CHLORIDE 0.9 % IV SOLN
7.5000 mg/kg | Freq: Once | INTRAVENOUS | Status: AC
  Administered 2021-09-21: 800 mg via INTRAVENOUS
  Filled 2021-09-21: qty 32

## 2021-09-21 MED ORDER — HEPARIN SOD (PORK) LOCK FLUSH 100 UNIT/ML IV SOLN
500.0000 [IU] | Freq: Once | INTRAVENOUS | Status: AC | PRN
  Administered 2021-09-21: 500 [IU]

## 2021-09-21 MED ORDER — DEXTROSE 5 % IV SOLN: Freq: Once | INTRAVENOUS | Status: AC

## 2021-09-21 MED ORDER — SODIUM CHLORIDE 0.9% FLUSH
10.0000 mL | INTRAVENOUS | Status: DC | PRN
  Administered 2021-09-21: 10 mL

## 2021-09-21 NOTE — Telephone Encounter (Signed)
Contacted Ms. Dave Vaughn in [facility] to have outside CT done on 08/31/2021 images and report sent through.  Ms. Dave Vaughn instructed this RN to contact [facility] to have them put report onto time line [phone]). ?

## 2021-09-21 NOTE — Patient Instructions (Signed)
[facility]COL[facility]  Discharge Instructions: °Thank you for choosing [facility] to provide your oncology and hematology care.  ° °If you have a lab appointment with the Cancer Center, please go directly to the Cancer Center and check in at the registration area. °  °Wear comfortable clothing and clothing appropriate for easy access to any Portacath or PICC line.  ° °We strive to give you quality time with your provider. You may need to reschedule your appointment if you arrive late (15 or more minutes).  Arriving late affects you and other patients whose appointments are after yours.  Also, if you miss three or more appointments without notifying the office, you may be dismissed from the clinic at the provider’s discretion.    °  °For prescription refill requests, have your pharmacy contact our office and allow 72 hours for refills to be completed.   ° °Today you received the following chemotherapy and/or immunotherapy agents: bevacizumab    °  °To help prevent nausea and vomiting after your treatment, we encourage you to take your nausea medication as directed. ° °BELOW ARE SYMPTOMS THAT SHOULD BE REPORTED IMMEDIATELY: °*FEVER GREATER THAN 100.4 F (38 °C) OR HIGHER °*CHILLS OR SWEATING °*NAUSEA AND VOMITING THAT IS NOT CONTROLLED WITH YOUR NAUSEA MEDICATION °*UNUSUAL SHORTNESS OF BREATH °*UNUSUAL BRUISING OR BLEEDING °*URINARY PROBLEMS (pain or burning when urinating, or frequent urination) °*BOWEL PROBLEMS (unusual diarrhea, constipation, pain near the anus) °TENDERNESS IN MOUTH AND THROAT WITH OR WITHOUT PRESENCE OF ULCERS (sore throat, sores in mouth, or a toothache) °UNUSUAL RASH, SWELLING OR PAIN  °UNUSUAL VAGINAL DISCHARGE OR ITCHING  ° °Items with * indicate a potential emergency and should be followed up as soon as possible or go to the Emergency Department if any problems should occur. ° °Please show the CHEMOTHERAPY ALERT CARD or IMMUNOTHERAPY ALERT CARD at check-in  to the Emergency Department and triage nurse. ° °Should you have questions after your visit or need to cancel or reschedule your appointment, please contact [facility]  Dept: [phone]  and follow the prompts.  Office hours are 8:00 a.m. to 4:30 p.m. Monday - Friday. Please note that voicemails left after 4:00 p.m. may not be returned until the following business day.  We are closed weekends and major holidays. You have access to a nurse at all times for urgent questions. Please call the main number to the clinic Dept: [phone] and follow the prompts. ° ° °For any non-urgent questions, you may also contact your provider using MyChart. We now offer e-Visits for anyone 18 and older to request care online for non-urgent symptoms. For details visit [email or web address]. °  °Also download the MyChart app! Go to the app store, search "MyChart", open the app, select [facility], and log in with your MyChart username and password. ° °Due to Covid, a mask is required upon entering the hospital/clinic. If you do not have a mask, one will be given to you upon arrival. For doctor visits, patients may have 1 support person aged 18 or older with them. For treatment visits, patients cannot have anyone with them due to current Covid guidelines and our immunocompromised population.  ° °

## 2021-09-21 NOTE — Progress Notes (Signed)
?[place]   ?Telephone:([phone] [phone]   ?Clinic Follow up Note  ? ?Patient Care Team: ?[name], MD as PCP - General (Family Medicine) ?[name], MD as Consulting Physician (Cardiology) ?[name], MD as Consulting Physician (Gastroenterology) ?[name], MD as Consulting Physician (General Surgery) ?[name], MD as Consulting Physician (General Surgery) ?[name], MD as Consulting Physician (Urology) ?[name], MD as Consulting Physician (Medical Oncology) ? ?Date of Service:  09/21/2021 ? ?CHIEF COMPLAINT: f/u of metastatic colon cancer ? ?CURRENT THERAPY:  ?Bevacizumab/Xeloda restarted 08/12/21 ?            -Xeloda dose: 1567m BID, days 1-14 q21days ? ?ASSESSMENT & PLAN:  ?Dave MERRYis a 76y.o. male with  ? ?1. Cancer of left colon, adenocarcinoma, stage IIIB (pT3N1cM0), MSI-stable, KRAS Q61H mutation (+), liver and peritoneal metastasis in 12/2019  ?-Diagnosed in 01/2018. S/p surgery and adjuvant FOLFOX. He did not tolerate well and declined additional treatment at that time. ?-restaging in 12/2019 revealed local recurrence in left abdominal wall and peritoneal metastasis, 2 hypermetabolic liver mets. Abdominal wall biopsy confirmed metastatic colorectal adenocarcinoma in 01/2020  ?-FO showed KRAS mutation (+), he is not a candidate for EGFR inhibitor.   ?-received 8 cycles FOLFIRI 8/23-12/8/21, Bevacizumab added with cycle 2. Tolerated well with fatigue, nausea, diarrhea. CEA normalized after cycle 4 ?-switched to maintenance xeloda and bevacizumab 06/02/20. Beva was held 01/13/21-06/10/21 due to high proteinuria ?-abdominal scar excision on 05/05/21 under Dr. RRosendo Gros Pathology revealed adenocarcinoma.  ?-CT CAP on 06/02/21 showed progressive disease in lung, liver, colon, and abdominal wall. ?-He opted to restart CAPOX. He restarted oxaliplatin and bevacizumab on 06/11/21. He tolerated poorly with a number of side effects,  thus oxali was discontinued and beva was held. He was able to recover. ?-[place]: no recurrent hernia, stable indeterminate 3 cm ventral abdominal wall mass, bibasilar lung nodules, and right hepatic lesions.  ?-he restarted bevacizumab on 08/12/21 and Xeloda on 08/13/21. He is tolerating well ?-chest CT on 09/16/21 showed: stable bilateral pulmonary nodules and peritoneal disease but worsening liver metastasis.  This is probably related to his treatment interruption during surgeries.  I personally reviewed the scan images and discussed the results with him today. I discussed the radiologists compared this scan to CT CAP in 05/2021 instead of from 07/2021.  ?-labs reviewed, urine protein 100, CBC and CMP are WNL. Will proceed with beva today and he will continue xeloda  ?  ?2. Abdominal hernia with bowel obstruction ?-he underwent emergent hernia repair as well as small bowel [place] was negative for malignancy (results available in Care Everywhere). ?  ?3. Hypothyroidism ?-Previously on synthroid, TSH normal 2 years ago. ?-TSH in 02/2020 up to 67, he went back on Synthroid since 02/16/20 ?  ?4. BPH  ?-Followed by urologist Dr. WLovena Neighbours?-Denies new or worsening urinary symptoms ?  ?5. Atrial Fibrillation ?-continue Eliquis. Rate controlled. He can stop Eliquis periodically for bleeding hemorrhoids ?-Continue follow-up with cardiology ?  ?6. CKD stage III ?-he has developed mild increased Cr since he started chemo, EGFR around 40-50's  ?-I encouraged him to control his blood pressure, cholesterol and BG. I also encouraged him to drink plenty of water.  ?-Will avoid NSAIDs and CT IV contrast if GFR low ?-Improved today ?  ?  ?PLAN: ?-proceed with bevacizumab today ?-continue Xeloda at same dose  ?-  lab, flush, f/u, and beva in 3 and 6 weeks ? ? ?No problem-specific Assessment & Plan notes found for this encounter. ? ? ?SUMMARY OF ONCOLOGIC  HISTORY: ?Oncology History Overview Note  ? Cancer Staging  ?Cancer of left colon ([place]) ?Staging form: Colon and Rectum, AJCC 8th Edition ?- Pathologic stage from 01/11/2018: Stage IIIB (pT3, pN1c, cM0) - Signed by [name], MD on 01/16/2018 ?Total positive nodes: 0 ?Histologic grading system: 4 grade system ?Histologic grade (G): G2 ? ? ?  ?Cancer of left colon ([place])  ?01/10/2018 Imaging  ? CT AP W Contrast 01/10/18  ?IMPRESSION: ?Irregular soft tissue density causing stricture of the mid ?descending colon likely the site of obstruction for the dilated ?small bowel. 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 No evidence of ?perforation. ?  ?Equivocal findings involving the appendix measuring 1.2 cm at the ?appendiceal tip with mucosal enhancement. No adjacent free fluid or inflammatory change. Findings are nonspecific, but can be seen with early acute appendicitis. ?  ?Mild prostatic enlargement. Increased density over the posterior ?bladder base likely due to the large prostatic impression although ?cannot completely exclude a bladder mass. Urology protocol CT or ?ultrasound may be helpful for better evaluation. ?  ?Mild cholelithiasis. ?  ?Stable 1.5 cm cystic structure over the lower pole right kidney ?likely slightly hyperdense cyst. ?  ?Diverticulosis of the colon. ?  ?Aortic Atherosclerosis (ICD10-I70.0). ?  ?01/11/2018 Cancer Staging  ? Staging form: Colon and Rectum, AJCC 8th Edition ?- Pathologic stage from 01/11/2018: Stage IIIB (pT3, pN1c, cM0) - Signed by [name], MD on 01/16/2018 ? ?  ?01/11/2018 Surgery  ? LEFT COLON RESECTION, TAKEDOWN SPLENIC FLEXURE, COLOSTOMY by Dr. [name]  ? ?  ?01/11/2018 Procedure  ? Colonoscopy 01/11/18 by Dr. [name]  ?- Malignant completely obstructing tumor in the mid descending colon. Tattooed. ?- Diverticulosis in the sigmoid colon. ?- Non-bleeding internal hemorrhoids. ?- No specimens collected. ? ?  ?01/11/2018 Pathology Results  ? Diagnosis 01/11/18  ?1. Colon, segmental resection for tumor,  descending colon ?- INVASIVE COLORECTAL ADENOCARCINOMA, 4 CM. ?- TUMOR EXTENDS INTO PERICOLONIC CONNECTIVE TISSUE. ?- TUMOR FOCALLY INVOLVES RADIAL MARGIN. ?- ONE MESENTERIC TUMOR DEPOSIT. ?- THIRTEEN BENIGN LYMPH NODES (0/13). ?2. Colon, segmental resection, splenic flexure ?- BENIGN COLON. ?- NO EVIDENCE OF MALIGNANCY . ? ?  ?01/11/2018 Tumor Marker  ? Baseline CEA at 3.4 ? ?  ?01/16/2018 Initial Diagnosis  ? Cancer of left colon ([place]) ? ?  ?01/23/2018 Imaging  ? CT CHEST WO CONTRAST IMPRESSION: ?1. No evidence for metastatic disease within the chest. ?2. Small left pleural effusion with underlying opacities which may ?represent atelectasis. Right basilar atelectasis. ?3. Few foci of gas within the upper abdomen in the omentum with ?surrounding fat stranding, likely postsurgical ?4. Aortic Atherosclerosis (ICD10-I70.0). ? ?  ?03/08/2018 - 05/15/2018 Chemotherapy  ? adjuvant FOLOFX. Due to side effects of neuropahty Oxaliplatin was stopped after 3 cycles and chemo was stopped after 6 cycles. He declined completing 6 months of chemo treatment.  ? ?  ?03/19/2018 Imaging  ? 03/19/2018 CT AP ?IMPRESSION: ?1. Interval partial left hemicolectomy and descending colostomy. ?2. Heterogeneous soft tissue density along the left anterior renal ?fascia is most likely postoperative (favor fat necrosis). No ?well-defined fluid collection. ?3. Mild left lower quadrant edema, new since 01/10/2018. This could ?be postoperative. Superimposed sigmoid diverticulitis and/or ?cystitis cannot be excluded. ?4. Subtle hyperenhancing nodule within the anterior bladder dome ?cannot be excluded. Consider nonemergent cystoscopy. When this is ?performed, recommend attention to the left  ureterovesicular junction ?and distal left ureter to evaluate questionable soft tissue ?fullness. ?5. Cholelithiasis. ?6.  Aortic Atherosclerosis (ICD10-I70.0). ?7. Prostatomegaly. ? ?  ?11/13/2018 Imaging  ? CT CAP WO Contrast 11/13/18 ? ?IMPRESSION: ?1. Reversal of left  lower quadrant colostomy with sigmoid colon ?anastomosis. No complicating features. No findings for residual or ?recurrent tumor or metastatic disease involving the chest, abdomen ?or pelvis without contrast

## 2021-09-21 NOTE — Telephone Encounter (Signed)
LVM for [facility] Radiology Team to please send images through PACS or to please contact this RN to mail CT images from CT Scan done on 07/29/2021 via CD.  Awaiting return telephone call from [facility] Radiology [phone] ?

## 2021-09-22 ENCOUNTER — Telehealth: Payer: Self-pay | Admitting: Hematology

## 2021-09-22 NOTE — Telephone Encounter (Signed)
Scheduled follow-up appointments per 4/17 los. Patient is aware. ?

## 2021-09-24 ENCOUNTER — Telehealth: Payer: Self-pay

## 2021-09-24 ENCOUNTER — Other Ambulatory Visit: Payer: Self-pay

## 2021-09-24 NOTE — Telephone Encounter (Signed)
LVM again with [facility] Radiology Team [phone]) regarding obtaining images from pt's 07/29/2021 CT Scan of Abdomen & Pelvis.  Requested if images from scan be pushed through to PACS so they can be seen in Epic under Care Everywhere.  Awaiting [facility]'s response. ? ?2nd attempt to contact [facility] for images. ?

## 2021-10-04 ENCOUNTER — Other Ambulatory Visit: Payer: Self-pay | Admitting: Family Medicine

## 2021-10-04 DIAGNOSIS — E039 Hypothyroidism, unspecified: Secondary | ICD-10-CM

## 2021-10-12 ENCOUNTER — Inpatient Hospital Stay: Payer: Commercial Managed Care - PPO

## 2021-10-12 ENCOUNTER — Inpatient Hospital Stay: Payer: Commercial Managed Care - PPO | Admitting: Nurse Practitioner

## 2021-10-20 NOTE — Progress Notes (Signed)
[facility] OFFICE PROGRESS NOTE  Dave Macadam, MD [place] [address]  DIAGNOSIS: f/u of metastatic colon cancer  Oncology History Overview Note   Cancer Staging  Cancer of left colon [facility]) Staging form: Colon and Rectum, AJCC 8th Edition - Pathologic stage from 01/11/2018: Stage IIIB (pT3, pN1c, cM0) - Signed by [name], MD on 01/16/2018 Total positive nodes: 0 Histologic grading system: 4 grade system Histologic grade (G): G2     Cancer of left colon ([facility])  01/10/2018 Imaging   CT AP W Contrast 01/10/18  IMPRESSION: Irregular soft tissue density causing stricture of the mid descending colon likely the site of obstruction for the dilated small bowel. 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 No evidence of perforation.   Equivocal findings involving the appendix measuring 1.2 cm at the appendiceal tip with mucosal enhancement. No adjacent free fluid or inflammatory change. Findings are nonspecific, but can be seen with early acute appendicitis.   Mild prostatic enlargement. Increased density over the posterior bladder base likely due to the large prostatic impression although cannot completely exclude a bladder mass. Urology protocol CT or ultrasound may be helpful for better evaluation.   Mild cholelithiasis.   Stable 1.5 cm cystic structure over the lower pole right kidney likely slightly hyperdense cyst.   Diverticulosis of the colon.   Aortic Atherosclerosis (ICD10-I70.0).   01/11/2018 Cancer Staging   Staging form: Colon and Rectum, AJCC 8th Edition - Pathologic stage from 01/11/2018: Stage IIIB (pT3, pN1c, cM0) - Signed by [name], MD on 01/16/2018    01/11/2018 Surgery   LEFT COLON RESECTION, TAKEDOWN SPLENIC FLEXURE, COLOSTOMY by Dr. [name]     01/11/2018 Procedure   Colonoscopy 01/11/18 by Dr. [name]  - Malignant completely obstructing tumor in the mid descending colon. Tattooed. - Diverticulosis in the sigmoid colon. -  Non-bleeding internal hemorrhoids. - No specimens collected.    01/11/2018 Pathology Results   Diagnosis 01/11/18  1. Colon, segmental resection for tumor, descending colon - INVASIVE COLORECTAL ADENOCARCINOMA, 4 CM. - TUMOR EXTENDS INTO PERICOLONIC CONNECTIVE TISSUE. - TUMOR FOCALLY INVOLVES RADIAL MARGIN. - ONE MESENTERIC TUMOR DEPOSIT. - THIRTEEN BENIGN LYMPH NODES (0/13). 2. Colon, segmental resection, splenic flexure - BENIGN COLON. - NO EVIDENCE OF MALIGNANCY .    01/11/2018 Tumor Marker   Baseline CEA at 3.4    01/16/2018 Initial Diagnosis   Cancer of left colon ([facility])    01/23/2018 Imaging   CT CHEST WO CONTRAST IMPRESSION: 1. No evidence for metastatic disease within the chest. 2. Small left pleural effusion with underlying opacities which may represent atelectasis. Right basilar atelectasis. 3. Few foci of gas within the upper abdomen in the omentum with surrounding fat stranding, likely postsurgical 4. Aortic Atherosclerosis (ICD10-I70.0).    03/08/2018 - 05/15/2018 Chemotherapy   adjuvant FOLOFX. Due to side effects of neuropahty Oxaliplatin was stopped after 3 cycles and chemo was stopped after 6 cycles. He declined completing 6 months of chemo treatment.     03/19/2018 Imaging   03/19/2018 CT AP IMPRESSION: 1. Interval partial left hemicolectomy and descending colostomy. 2. Heterogeneous soft tissue density along the left anterior renal fascia is most likely postoperative (favor fat necrosis). No well-defined fluid collection. 3. Mild left lower quadrant edema, new since 01/10/2018. This could be postoperative. Superimposed sigmoid diverticulitis and/or cystitis cannot be excluded. 4. Subtle hyperenhancing nodule within the anterior bladder dome cannot be excluded. Consider nonemergent cystoscopy. When this is performed, recommend attention to the left ureterovesicular junction  and distal left ureter to evaluate questionable soft tissue fullness. 5.  Cholelithiasis. 6.  Aortic Atherosclerosis (ICD10-I70.0). 7. Prostatomegaly.    11/13/2018 Imaging   CT CAP WO Contrast 11/13/18  IMPRESSION: 1. Reversal of left lower quadrant colostomy with sigmoid colon anastomosis. No complicating features. No findings for residual or recurrent tumor or metastatic disease involving the chest, abdomen or pelvis without contrast. 2. No acute abdominal/pelvic findings. 3. Gallbladder sludge and gallstones but no findings for acute cholecystitis. 4. Stable anterior abdominal wall hernia. 5. The right testicle is in the right inguinal canal.    10/03/2019 Imaging   CT CAP WO contrast  IMPRESSION: 1. New rounded density interposed between the prostate gland and anterior upper rectal wall could represent adenopathy or local extension of anterior rectal tumor. 2. Marked prostatomegaly, prostate volume 150 cubic cm. 3. Other imaging findings of potential clinical significance: Aortic Atherosclerosis (ICD10-I70.0). Coronary atherosclerosis. Trace right pleural effusion. Suspected cholelithiasis. Nonobstructive left nephrolithiasis. Multilevel lumbar impingement. Bilateral mildly retracted testicles. Hypodense exophytic lesion of the right kidney, most likely to be a cyst.   11/02/2019 Procedure   colonoscopy on 11/02/2019 by Dr. [name] Plan showed normal digital rectal exam, 3 polyps in the rectum, descending colon and cecum, and a prior sigmoid: Anastomosis characterized by erythema.  He found an extrinsic nonobstructing medium-sized mass in the proximal rectum about 4 cm in length, no internal rectal mass.   Diagnosis Surgical [P], colon, cecum, descending, rectal, polyp (3) - TUBULAR ADENOMA (TWO) - NO HIGH GRADE DYSPLASIA OR CARCINOMA. - COLONIC FRAGMENT WITH BENIGN LYMPHOID AGGREGATE.   12/07/2019 Imaging   MRI pelvis IMPRESSION: 1. Masslike area in the rectum suspicious for rectal neoplasm, likely T4b based on the appearance of soft tissue extending  along the anterior peritoneal reflection and into the seminal vesicles. Correlation with recent colonoscopy results may be helpful. Area of anastomosis and other areas of the pelvis are not imaged on today's exam.   12/21/2019 PET scan   IMPRESSION: 1. Unfortunately evidence for peritoneal metastasis. Intensely hypermetabolic nodules along the LEFT pericolic gutter. Favor hypermetabolic mass anterior to the rectum to represent serosal implant along the ventral surface of the rectum. 2. local recurrence within the LEFT abdominal wall at site prior colostomy. Intense hypermetabolic thickening of the rectus muscle at this site. 3. Two hypermetabolic hepatic metastasis.   01/15/2020 Relapse/Recurrence   FINAL MICROSCOPIC DIAGNOSIS:   A. SOFT TISSUE, LEFT ABDOMINAL WALL, BIOPSY:  - Adenocarcinoma.  - See comment.   COMMENT:   The morphology is consistent with metastatic colorectal adenocarcinoma.    01/28/2020 -  Chemotherapy   First line FOLFIRI q2weeks starting 01/28/20 for 8 cycles.  -----Bevacizumab added with C2.  -----Changed to maintenance Xeloda 2000 mg twice daily for 2 weeks on/1 week off and bevacizumab 06/02/20. Starting with C3, dose reduce to 1539m BID due to skin toxicity.   05/05/2020 Imaging   IMPRESSION: 1. Improved appearance, with reduced size of the hepatic metastatic lesions and reduced size of the peritoneal tumor implants. 2. Other imaging findings of potential clinical significance: Notable prostatomegaly. Multilevel impingement in the lumbar spine. Dependent density in the gallbladder possibly from sludge or gallstones. Degenerative glenohumeral arthropathy bilaterally. 3. Aortic atherosclerosis.   08/15/2020 Imaging   CT CAP  IMPRESSION: Chest Impression:   No evidence of thoracic metastasis   Abdomen / Pelvis Impression:   1. Hepatic metastasis are no longer measurable by CT imaging. 2. Peritoneal nodular metastasis in the LEFT abdomen are  decreased in size. 3.  No evidence of new peritoneal disease. 4. Thickening and LEFT rectus muscle at site of prior metastasis. No interval change. 5. No evidence of new or progressive colorectal carcinoma.     11/27/2020 Imaging   CT CAP  IMPRESSION: 1. There are multiple small pulmonary nodules in the right upper lobe that are new or enlarged compared to prior examination but measuring 4 mm or smaller, suspicious for pulmonary metastatic disease. 2. Unchanged peritoneal nodule adjacent to the tip of the spleen measuring 1.3 x 1.2 cm. Slightly peritoneal nodule adjacent to the splenic flexure measuring no greater than 6 mm, previously 8 mm. 3. No significant change in previously hypermetabolic soft tissue mass centered about the left lower quadrant colostomy site. 4. Previously established hypermetabolic hepatic metastatic disease remains inapparent by CT. 5. Status post sigmoid colon resection and reanastomosis. 6. Prostatomegaly with thickening of the decompressed urinary bladder, likely secondary to chronic outlet obstruction. 7. Cholelithiasis.   Aortic Atherosclerosis (ICD10-I70.0).   01/29/2021 Imaging   CT CAP   IMPRESSION: 1. Minimal interval progression of bilateral pulmonary nodules, concerning for metastatic disease. 2. No substantial change in size of the peritoneal nodule at the inferior tip of the spleen and splenic flexure nodule. 3. Nodular soft tissue measured at the ostomy site previously is not evident today. 4. Marked prostatomegaly. 5. Cholelithiasis. 6. Aortic Atherosclerosis (ICD10-I70.0).   04/06/2021 Imaging   EXAM: CT ABDOMEN AND PELVIS WITHOUT CONTRAST (Renal Stone Protocol)  IMPRESSION: 1. Bladder wall thickening with surrounding inflammation concerning for cystitis. 2. Partial colectomy. Colon is air-filled with relative transition just proximal to the anastomosis. Stricture or recurrence at this level would be difficult to exclude. No  focal mass identified. 3. There is a new subcutaneous fluid collection at the level of prior ostomy in the left abdominal wall. Infection not excluded. 4. There is new intramuscular hyperdensity at the level of the prior ostomy, indeterminate. Findings may related to scarring, tumor recurrence or metastatic disease is not excluded. 5. Stable nodularity in the left upper quadrant. 6. Cholelithiasis. 7. Trace right pleural effusion. 8. Stable prostatomegaly. 9.  Aortic Atherosclerosis (ICD10-I70.0).   05/05/2021 Pathology Results   FINAL MICROSCOPIC DIAGNOSIS:   A. ABDOMINAL WALL SCAR, EXCISION:  -  Adenocarcinoma  -  See comment   COMMENT:  Morphologically consistent with colonic adenocarcinoma (similar to  previously reported ([id number]).   06/02/2021 Imaging   EXAM: CT CHEST, ABDOMEN, AND PELVIS WITH CONTRAST  IMPRESSION: 1. Progressive multifocal pulmonary metastatic disease. 2. New low-density hepatic lesions, likely metastases. 3. Progressive peritoneal implant near the splenic flexure of the colon. Small peritoneal implant inferior to the spleen appears unchanged. 4. Increased soft tissue nodularity near the midline incision in the upper anterior abdominal wall, suspicious for tumor recurrence at the incision. This could reflect keloid formation. 5. Fluid collection in the left anterior abdominal wall attributed to recent abdominal surgery with associated small incisional hernia containing fat. 6. Additional incidental findings including prostatomegaly, cholelithiasis and Aortic Atherosclerosis (ICD10-I70.0).   06/11/2021 -  Chemotherapy   Patient is on Treatment Plan : COLORECTAL CapeOx + Bevacizumab q21d      07/29/2021 Imaging   EXAMINATION: CT ABDOMEN PELVIS WO CONTRAST  Impression  1. Left anterior abdominal wall postsurgical changes with no recurrent hernia.  2. Stable indeterminate 3 cm ventral abdominal wall mass located at the L1-2 level. Question  scarring from prior surgery versus neoplasm.  3. Cholelithiasis.  4. Stable indeterminate bibasilar lung nodules. All of with a CT chest is recommended.  5. Indeterminate stable right hepatic lesions. Consider follow-up with an abdominal MRI with and without contrast for additional characterization.          CURRENT THERAPY: Bevacizumab/Xeloda restarted 08/12/21             -Xeloda dose: 152m BID, days 1-14 q21days  INTERVAL HISTORY: Dave [id number].o. male returns to the clinic today for a follow-up visit.  The patient was last seen in the clinic on 09/21/2021 by Dr. FBurr Medico  The patient is currently undergoing treatment with Avastin and Xeloda.  He is tolerating this fairly well without any concerning adverse side effects except for some puffiness/tightness/and peeling of his hands/feet, especially on the second week of Xeloda. He is still very active and works full time. He uses his wife's lotion.  Today he denies any fever, chills, night sweats, or unexplained weight loss.  He denies any appetite change.  Ever since having his hernia surgery, he reports he has some uncomfortable pulling sensation and sharp pain in this area that comes and goes.  He will take Tylenol in the morning for this.  He saw Dr. RRosendo Grosa few weeks ago who discussed that if he develops any skin thickening or lumps in the area they may need to surgically reassess that area and the sutures may be pulling/too tight.  He denies any chest pain, shortness of breath, cough, or hemoptysis.  Denies any nausea, vomiting, diarrhea, or constipation.  He denies any jaundice or itching.  He denies any rectal bleeding.  He denies any other abnormal bleeding or bruising.  He is here today for evaluation and repeat blood work before undergoing cycle #5.     MEDICAL HISTORY: Past Medical History:  Diagnosis Date   Adenocarcinoma, colon ([facility] dx'd 01/2018   Anemia    taking iron supplements   Anxiety    Atrial fibrillation with  RVR ([facility])    Colonic obstruction ([facility] 01/10/2018   Depression    Diverticulitis    Dysrhythmia    afib   History of kidney stones    Hypertension    Indwelling Foley catheter present    due to UTI    ALLERGIES:  has No Known Allergies.  MEDICATIONS:  Current Outpatient Medications  Medication Sig Dispense Refill   apixaban (ELIQUIS) 5 MG TABS tablet Take 1 tablet (5 mg total) by mouth 2 (two) times daily. 60 tablet 6   b complex vitamins tablet Take 1 tablet by mouth daily.     capecitabine (XELODA) 500 MG tablet TAKE 3 TABLETS BY MOUTH EVERY 12 HOURS AFTER A MEAL FOR 14 DAYS  ON THEN 7 DAYS OFF 84 tablet 1   diltiazem (CARDIZEM CD) 360 MG 24 hr capsule TAKE 1 CAPSULE(360 MG) BY MOUTH DAILY 90 capsule 3   finasteride (PROSCAR) 5 MG tablet Take 5 mg by mouth daily.     levothyroxine (SYNTHROID) 75 MCG tablet TAKE 1 TABLET(75 MCG) BY MOUTH DAILY 90 tablet 1   lidocaine-prilocaine (EMLA) cream Apply 1 application topically as needed. 30 g 1   Multiple Vitamins-Iron (MULTIVITAMIN/IRON PO) Take 1 tablet by mouth daily.      tamsulosin (FLOMAX) 0.4 MG CAPS capsule Take 0.4 mg by mouth 2 (two) times daily.      No current facility-administered medications for this visit.   Facility-Administered Medications Ordered in Other Visits  Medication Dose Route Frequency Provider Last Rate Last Admin   bevacizumab-bvzr (ZIRABEV) 800 mg in sodium chloride 0.9 % 100 mL chemo  infusion  7.5 mg/kg (Treatment Plan Recorded) Intravenous Once [name], MD       heparin lock flush 100 unit/mL  500 Units Intracatheter Once PRN [name], MD       sodium chloride flush (NS) 0.9 % injection 10 mL  10 mL Intracatheter PRN [name], MD        SURGICAL HISTORY:  Past Surgical History:  Procedure Laterality Date   ANKLE SURGERY Left    when he was in college   COLON RESECTION N/A 01/11/2018   Procedure: LEFT COLON RESECTION, TAKEDOWN SPLENIC FLEXURE, COLOSTOMY;  Surgeon: [name], MD;  Location: [facility];  Service: General;  Laterality: N/A;   COLONOSCOPY  01/11/2018   Procedure: COLONOSCOPY;  Surgeon: Jackquline Denmark, MD;  Location: [facility];  Service: Endoscopy;;   COLONOSCOPY  05/10/2018   colonscopy  05/10/2018   EXCISION OF KELOID N/A 05/05/2021   Procedure: ABDOMINAL SCAR EXCISION;  Surgeon: Ralene Ok, MD;  Location: [facility];  Service: General;  Laterality: N/A;   FOREIGN BODY REMOVAL ABDOMINAL N/A 03/09/2021   Procedure: EXCISION OF FOREIGN BODY;  Surgeon: Ralene Ok, MD;  Location: [facility];  Service: General;  Laterality: N/A;   HERNIA REPAIR     HERNIA REPAIR  03/02/2019   EXPLORATORY LAPAROTOMY (N/A Abdomen)   INCISIONAL HERNIA REPAIR N/A 03/02/2019   Procedure: INCISIONAL HERNIA REPAIR , RECTORECTUS VS TAR HERNIA REPAIR;  Surgeon: Ralene Ok, MD;  Location: [facility] [place];  Service: General;  Laterality: N/A;   INSERTION OF MESH N/A 03/02/2019   Procedure: Insertion Of Mesh;  Surgeon: Ralene Ok, MD;  Location: [facility];  Service: General;  Laterality: N/A;   LAPAROTOMY N/A 03/02/2019   Procedure: EXPLORATORY LAPAROTOMY;  Surgeon: Ralene Ok, MD;  Location: [facility];  Service: General;  Laterality: N/A;   LAPAROTOMY N/A 03/09/2021   Procedure: EXPLORATION OF ABDOMINAL WALL;  Surgeon: Ralene Ok, MD;  Location: [facility];  Service: General;  Laterality: N/A;   LYSIS OF ADHESION N/A 06/12/2018   Procedure: LYSIS OF ADHESIONS;  Surgeon: [name], MD;  Location: [facility];  Service: General;  Laterality: N/A;   LYSIS OF ADHESION N/A 03/02/2019   Procedure: Lysis Of Adhesion;  Surgeon: Ralene Ok, MD;  Location: [facility];  Service: General;  Laterality: N/A;   PORTACATH PLACEMENT Right 03/07/2018   Procedure: INSERTION PORT-A-CATH RIGHT SUBCLAVIAN;  Surgeon: [name], MD;  Location: [facility];  Service: General;  Laterality: Right;   PROCTOSCOPY N/A 06/12/2018   Procedure: RIGID PROCTOSCOPY;  Surgeon: [name], MD;  Location: [facility];  Service: General;  Laterality: N/A;    thumb surgery   2018   cyst removal    REVIEW OF SYSTEMS:   Review of Systems  Constitutional: Negative for appetite change, chills, fatigue, fever and unexpected weight change.  HENT:   Negative for mouth sores, nosebleeds, sore throat and trouble swallowing.   Eyes: Negative for eye problems and icterus.  Respiratory: Negative for cough, hemoptysis, shortness of breath and wheezing.   Cardiovascular: Negative for chest pain and leg swelling.  Gastrointestinal: Negative for abdominal pain, constipation, diarrhea, nausea and vomiting.  Genitourinary: Negative for bladder incontinence, difficulty urinating, dysuria, frequency and hematuria.   Musculoskeletal: Positive for intermittent abdominal discomfort at prior surgical site.  Negative for back pain, gait problem, neck pain and neck stiffness.  Skin: Positive for dry cracking hands.  Mild erythema.  Neurological: Negative for dizziness, extremity weakness, gait problem, headaches, light-headedness and seizures.  Hematological: Negative  for adenopathy. Does not bruise/bleed easily.  Psychiatric/Behavioral: Negative for confusion, depression and sleep disturbance. The patient is not nervous/anxious.     PHYSICAL EXAMINATION:  Blood pressure (!) 153/84, pulse 92, temperature 97.7 F (36.5 C), temperature source Temporal, resp. rate 17, height 5' 10"  (1.778 m), weight 225 lb 8 oz (102.3 kg), SpO2 100 %.  ECOG PERFORMANCE STATUS: 1  Physical Exam  Constitutional: Oriented to person, place, and time and well-developed, well-nourished, and in no distress.  HENT:  Head: Normocephalic and atraumatic.  Mouth/Throat: Oropharynx is clear and moist. No oropharyngeal exudate.  Eyes: Conjunctivae are normal. Right eye exhibits no discharge. Left eye exhibits no discharge. No scleral icterus.  Neck: Normal range of motion. Neck supple.  Cardiovascular: Normal rate, regular rhythm, normal heart sounds and intact distal pulses.    Pulmonary/Chest: Effort normal and breath sounds normal. No respiratory distress. No wheezes. No rales.  Abdominal: Soft. Bowel sounds are normal. Exhibits no distension and no mass. There is no tenderness.  Musculoskeletal: Normal range of motion. Exhibits no edema.  Lymphadenopathy:    No cervical adenopathy.  Neurological: Alert and oriented to person, place, and time. Exhibits normal muscle tone. Gait normal. Coordination normal.  Skin: Positive for dry hands and mild erythema. Not diaphoretic. No pallor.  Psychiatric: Mood, memory and judgment normal.  Vitals reviewed.  LABORATORY DATA: Lab Results  Component Value Date   WBC 6.0 10/22/2021   HGB 15.2 10/22/2021   HCT 44.1 10/22/2021   MCV 93.0 10/22/2021   PLT 182 10/22/2021      Chemistry      Component Value Date/Time   NA 139 10/22/2021 0959   K 4.1 10/22/2021 0959   CL 105 10/22/2021 0959   CO2 30 10/22/2021 0959   BUN 26 (H) 10/22/2021 0959   CREATININE 1.40 (H) 10/22/2021 0959      Component Value Date/Time   CALCIUM 9.3 10/22/2021 0959   ALKPHOS 90 10/22/2021 0959   AST 25 10/22/2021 0959   ALT 22 10/22/2021 0959   BILITOT 1.0 10/22/2021 0959       RADIOGRAPHIC STUDIES:  No results found.   ASSESSMENT/PLAN:  AVRAM DANIELSON is a 76 y.o. male with    1. Cancer of left colon, adenocarcinoma, stage IIIB (pT3N1cM0), MSI-stable, KRAS Q61H mutation (+), liver and peritoneal metastasis in 12/2019  -Diagnosed in 01/2018. S/p surgery and adjuvant FOLFOX. He did not tolerate well and declined additional treatment at that time. -restaging in 12/2019 revealed local recurrence in left abdominal wall and peritoneal metastasis, 2 hypermetabolic liver mets. Abdominal wall biopsy confirmed metastatic colorectal adenocarcinoma in 01/2020  -FO showed KRAS mutation (+), he is not a candidate for EGFR inhibitor.   -received 8 cycles FOLFIRI 8/23-12/8/21, Bevacizumab added with cycle 2. Tolerated well with fatigue, nausea,  diarrhea. CEA normalized after cycle 4 -switched to maintenance xeloda and bevacizumab 06/02/20. Beva was held 01/13/21-06/10/21 due to high proteinuria -abdominal scar excision on 05/05/21 under Dr. Rosendo Gros. Pathology revealed adenocarcinoma.  -CT CAP on 06/02/21 showed progressive disease in lung, liver, colon, and abdominal wall. -He opted to restart CAPOX. He restarted oxaliplatin and bevacizumab on 06/11/21. He tolerated poorly with a number of side effects, thus oxali was discontinued and beva was held. He was able to recover. -he had CT AP on 07/29/21 in [place]in [place]) showing: no recurrent hernia, stable indeterminate 3 cm ventral abdominal wall mass, bibasilar lung nodules, and right hepatic lesions.  -he restarted bevacizumab on 08/12/21 and Xeloda  on 08/13/21. He is tolerating well -chest CT on 09/16/21 showed: stable bilateral pulmonary nodules and peritoneal disease but worsening liver metastasis.  Dr. Burr Medico feels this is probably related to his treatment interruption during surgeries. Dr. Burr Medico previously discussed the radiologists compared this scan to CT CAP in 05/2021 instead of from 07/2021.  -Today (10/22/21), labs reviewed, urine protein 100, CBC and CMP are except he has a slight bump in his creatinine at 1.4.  Additionally, the patient rates his hand-foot syndrome a 6 out of 10 with interfering with his quality of life.  I have discussed this with Lacie NP and Dr. Burr Medico.  Because of the slight bump in his creatinine Bella Kennedy recommends reducing the dose of his Xeloda this week to 1000 mg twice daily.  I will arrange for a lab only visit next week.  If his creatinine returns to normal then will advise the patient to resume Xeloda at either 1000 mg in the a.m. and 1500 in the p.m. or 1500 mg twice daily based on his symptoms.  -If his creatinine had been within normal limits today, Dr. Burr Medico recommended reducing the Xeloda1000 mg in the morning and 1500 in the evening for days 7 through 14 if able  to tolerate, if interferes with quality of life too significantly, then he could take 1000 mg in the morning and 1500 in the evening on days 1 through 14.  -Additionally, the patient was encouraged to hydrate. -From looking at his schedule, it appears the schedule is off by 1 week.  I will reach out to the scheduling team to make his follow-up visit for Avastin in 3 weeks from now.  He will proceed with Avastin cycle #5 today as scheduled.  Will proceed with beva today and he will continue xeloda 1000 mg BID.   2.  Hand-foot syndrome -Today (10/22/21), patient reporting dry cracked hands and feet, mild erythema, and tightness/puffiness.  The patient is still active and works full-time.  The patient rated this a 6 out of 10 interfering with his day-to-day activities. -We are reducing the dose of his Xeloda this week to 1000 mg BID because of the bump in his creatinine; however, otherwise I reviewed this with Dr. Burr Medico and moving forward, when labs acceptable, she recommended reducing his Xeloda to 1000 mg in the a.m. and 1500 in the p.m. on days 7 through 14 if tolerable, and if not tolerable on days 1 through 14. -After he has repeat labs drawn next week I will call the patient and let him know how to resume taking his Xeloda and will check to see how his symptoms are. -Also advised to alternate with urea cream and hydrocortisone cream.  He also can continue to use lotion.   3. Abdominal hernia with bowel obstruction -he underwent emergent hernia repair as well as small bowel resection on 06/16/21 while in [place]. Pathology was negative for malignancy (results available in Care Everywhere). -He recently had a follow-up visit with his provider in [place].  The patient notes he intermittently has a discomfort and pulling at the surgical site.  Dr. Leonia Corona reportedly told the patient it may be secondary to the sutures being too tight.  If any overlying lumps or skin changes occur, the patient mentions  they may need to surgically reassess this area.  Overall the abdominal pain is similar to after his surgery and is intermittent.  The patient is taking Tylenol presently if needed.   4. Hypothyroidism -Previously on synthroid, TSH normal  2 years ago. -TSH in 02/2020 up to 67, he went back on Synthroid since 02/16/20   5. BPH  -Followed by urologist Dr. Lovena Neighbours -Denies new or worsening urinary symptoms   6. Atrial Fibrillation -continue Eliquis. Rate controlled. He can stop Eliquis periodically for bleeding hemorrhoids -Continue follow-up with cardiology   7. CKD stage III -he has developed mild increased Cr since he started chemo, EGFR around 40-50's  -Dr. Burr Medico previously encouraged him to control his blood pressure, cholesterol and BG. She also previously encouraged him to drink plenty of water.  -Will avoid NSAIDs and CT IV contrast if GFR low - He has a slight bump in his creatinine today (10/22/2021).  I reviewed with [facility] nurse practitioner who recommends reducing the dose of Xeloda to 1000 mg twice daily and rechecking his labs in 1 week.  If his labs returned to normal, then we will increase his dose to 1000 mg in the a.m. and 1500 in the p.m. or 1500 twice daily based on his hand-foot syndrome. -The patient was encouraged to hydrate.     PLAN: -proceed with bevacizumab today -Xeloda 1000 mg twice daily due to increased creatinine.  We will check lab next week and make decision about his dose based on the labs. -lab, flush, f/u, and beva in 3 and 6 weeks. Will need to update schedule.  -Encouraged to hydrate -Use hydrocortisone cream and urea cream alternating. -Advised to monitor blood pressure closely at home.   No orders of the defined types were placed in this encounter.    The total time spent in the appointment was 30-39 minute.   Keylon Labelle L Larin Depaoli, PA-C 10/22/21

## 2021-10-22 ENCOUNTER — Inpatient Hospital Stay: Payer: Commercial Managed Care - PPO | Attending: Nurse Practitioner

## 2021-10-22 ENCOUNTER — Inpatient Hospital Stay (HOSPITAL_BASED_OUTPATIENT_CLINIC_OR_DEPARTMENT_OTHER): Payer: Commercial Managed Care - PPO | Admitting: Physician Assistant

## 2021-10-22 ENCOUNTER — Other Ambulatory Visit: Payer: Self-pay

## 2021-10-22 ENCOUNTER — Inpatient Hospital Stay: Payer: Commercial Managed Care - PPO

## 2021-10-22 ENCOUNTER — Encounter: Payer: Self-pay | Admitting: Hematology

## 2021-10-22 VITALS — BP 150/92 | HR 68 | Temp 98.0°F | Resp 18

## 2021-10-22 VITALS — BP 153/84 | HR 92 | Temp 97.7°F | Resp 17 | Ht 70.0 in | Wt 225.5 lb

## 2021-10-22 DIAGNOSIS — C787 Secondary malignant neoplasm of liver and intrahepatic bile duct: Secondary | ICD-10-CM | POA: Diagnosis not present

## 2021-10-22 DIAGNOSIS — C7800 Secondary malignant neoplasm of unspecified lung: Secondary | ICD-10-CM | POA: Insufficient documentation

## 2021-10-22 DIAGNOSIS — C186 Malignant neoplasm of descending colon: Secondary | ICD-10-CM

## 2021-10-22 DIAGNOSIS — Z7901 Long term (current) use of anticoagulants: Secondary | ICD-10-CM | POA: Insufficient documentation

## 2021-10-22 DIAGNOSIS — C786 Secondary malignant neoplasm of retroperitoneum and peritoneum: Secondary | ICD-10-CM | POA: Insufficient documentation

## 2021-10-22 DIAGNOSIS — I4891 Unspecified atrial fibrillation: Secondary | ICD-10-CM | POA: Diagnosis not present

## 2021-10-22 DIAGNOSIS — Z5112 Encounter for antineoplastic immunotherapy: Secondary | ICD-10-CM | POA: Insufficient documentation

## 2021-10-22 DIAGNOSIS — E039 Hypothyroidism, unspecified: Secondary | ICD-10-CM | POA: Insufficient documentation

## 2021-10-22 DIAGNOSIS — N183 Chronic kidney disease, stage 3 unspecified: Secondary | ICD-10-CM | POA: Diagnosis not present

## 2021-10-22 DIAGNOSIS — I129 Hypertensive chronic kidney disease with stage 1 through stage 4 chronic kidney disease, or unspecified chronic kidney disease: Secondary | ICD-10-CM | POA: Diagnosis not present

## 2021-10-22 LAB — CMP (CANCER CENTER ONLY)
ALT: 22 U/L (ref 0–44)
AST: 25 U/L (ref 15–41)
Albumin: 4.1 g/dL (ref 3.5–5.0)
Alkaline Phosphatase: 90 U/L (ref 38–126)
Anion gap: 4 — ABNORMAL LOW (ref 5–15)
BUN: 26 mg/dL — ABNORMAL HIGH (ref 8–23)
CO2: 30 mmol/L (ref 22–32)
Calcium: 9.3 mg/dL (ref 8.9–10.3)
Chloride: 105 mmol/L (ref 98–111)
Creatinine: 1.4 mg/dL — ABNORMAL HIGH (ref 0.61–1.24)
GFR, Estimated: 53 mL/min — ABNORMAL LOW (ref >60)
Glucose, Bld: 121 mg/dL — ABNORMAL HIGH (ref 70–99)
Potassium: 4.1 mmol/L (ref 3.5–5.1)
Sodium: 139 mmol/L (ref 135–145)
Total Bilirubin: 1 mg/dL (ref 0.3–1.2)
Total Protein: 7.1 g/dL (ref 6.5–8.1)

## 2021-10-22 LAB — CBC WITH DIFFERENTIAL (CANCER CENTER ONLY)
Abs Immature Granulocytes: 0.01 10*3/uL (ref 0.00–0.07)
Basophils Absolute: 0 10*3/uL (ref 0.0–0.1)
Basophils Relative: 1 %
Eosinophils Absolute: 0.1 10*3/uL (ref 0.0–0.5)
Eosinophils Relative: 2 %
HCT: 44.1 % (ref 39.0–52.0)
Hemoglobin: 15.2 g/dL (ref 13.0–17.0)
Immature Granulocytes: 0 %
Lymphocytes Relative: 31 %
Lymphs Abs: 1.9 10*3/uL (ref 0.7–4.0)
MCH: 32.1 pg (ref 26.0–34.0)
MCHC: 34.5 g/dL (ref 30.0–36.0)
MCV: 93 fL (ref 80.0–100.0)
Monocytes Absolute: 0.5 10*3/uL (ref 0.1–1.0)
Monocytes Relative: 8 %
Neutro Abs: 3.4 10*3/uL (ref 1.7–7.7)
Neutrophils Relative %: 58 %
Platelet Count: 182 10*3/uL (ref 150–400)
RBC: 4.74 MIL/uL (ref 4.22–5.81)
RDW: 16.3 % — ABNORMAL HIGH (ref 11.5–15.5)
WBC Count: 6 10*3/uL (ref 4.0–10.5)
nRBC: 0 % (ref 0.0–0.2)

## 2021-10-22 LAB — CEA (IN HOUSE-CHCC): CEA (CHCC-In House): 19.7 ng/mL — ABNORMAL HIGH (ref 0.00–5.00)

## 2021-10-22 LAB — TOTAL PROTEIN, URINE DIPSTICK: Protein, ur: 100 mg/dL — AB

## 2021-10-22 MED ORDER — SODIUM CHLORIDE 0.9% FLUSH
10.0000 mL | INTRAVENOUS | Status: DC | PRN
  Administered 2021-10-22: 10 mL

## 2021-10-22 MED ORDER — SODIUM CHLORIDE 0.9 % IV SOLN
7.5000 mg/kg | Freq: Once | INTRAVENOUS | Status: AC
  Administered 2021-10-22: 800 mg via INTRAVENOUS
  Filled 2021-10-22: qty 32

## 2021-10-22 MED ORDER — SODIUM CHLORIDE 0.9 % IV SOLN: Freq: Once | INTRAVENOUS | Status: AC

## 2021-10-22 MED ORDER — HEPARIN SOD (PORK) LOCK FLUSH 100 UNIT/ML IV SOLN
500.0000 [IU] | Freq: Once | INTRAVENOUS | Status: AC | PRN
  Administered 2021-10-22: 500 [IU]

## 2021-10-22 NOTE — Progress Notes (Signed)
Okay to treat with urine protein 100 per [employer], PA

## 2021-10-22 NOTE — Patient Instructions (Signed)
[facility]  Discharge Instructions: Thank you for choosing [facility] to provide your oncology and hematology care.   If you have a lab appointment with the [facility], please go directly to the [facility] and check in at the registration area.   Wear comfortable clothing and clothing appropriate for easy access to any Portacath or PICC line.   We strive to give you quality time with your provider. You may need to reschedule your appointment if you arrive late (15 or more minutes).  Arriving late affects you and other patients whose appointments are after yours.  Also, if you miss three or more appointments without notifying the office, you may be dismissed from the clinic at the provider's discretion.      For prescription refill requests, have your pharmacy contact our office and allow 72 hours for refills to be completed.    Today you received the following chemotherapy and/or immunotherapy agents bevacizumab      To help prevent nausea and vomiting after your treatment, we encourage you to take your nausea medication as directed.  BELOW ARE SYMPTOMS THAT SHOULD BE REPORTED IMMEDIATELY: *FEVER GREATER THAN 100.4 F (38 C) OR HIGHER *CHILLS OR SWEATING *NAUSEA AND VOMITING THAT IS NOT CONTROLLED WITH YOUR NAUSEA MEDICATION *UNUSUAL SHORTNESS OF BREATH *UNUSUAL BRUISING OR BLEEDING *URINARY PROBLEMS (pain or burning when urinating, or frequent urination) *BOWEL PROBLEMS (unusual diarrhea, constipation, pain near the anus) TENDERNESS IN MOUTH AND THROAT WITH OR WITHOUT PRESENCE OF ULCERS (sore throat, sores in mouth, or a toothache) UNUSUAL RASH, SWELLING OR PAIN  UNUSUAL VAGINAL DISCHARGE OR ITCHING   Items with * indicate a potential emergency and should be followed up as soon as possible or go to the Emergency Department if any problems should occur.  Please show the CHEMOTHERAPY ALERT CARD or IMMUNOTHERAPY ALERT CARD at check-in to  the Emergency Department and triage nurse.  Should you have questions after your visit or need to cancel or reschedule your appointment, please contact [facility]  Dept: [phone]  and follow the prompts.  Office hours are 8:00 a.m. to 4:30 p.m. Monday - Friday. Please note that voicemails left after 4:00 p.m. may not be returned until the following business day.  We are closed weekends and major holidays. You have access to a nurse at all times for urgent questions. Please call the main number to the clinic Dept: [phone] and follow the prompts.   For any non-urgent questions, you may also contact your provider using MyChart. We now offer e-Visits for anyone 31 and older to request care online for non-urgent symptoms. For details visit [email or web address].   Also download the MyChart app! Go to the app store, search "MyChart", open the app, select [facility], and log in with your MyChart username and password.  Due to Covid, a mask is required upon entering the hospital/clinic. If you do not have a mask, one will be given to you upon arrival. For doctor visits, patients may have 1 support person aged 56 or older with them. For treatment visits, patients cannot have anyone with them due to current Covid guidelines and our immunocompromised population.   Bevacizumab injection What is this medication? BEVACIZUMAB (be va SIZ yoo mab) is a monoclonal antibody. It is used to treat many types of cancer. This medicine may be used for other purposes; ask your health care provider or pharmacist if you have questions. COMMON BRAND NAME(S): Alymsys, Avastin,  MVASI, Noah Charon What should I tell my care team before I take this medication? They need to know if you have any of these conditions: diabetes heart disease high blood pressure history of coughing up blood prior anthracycline chemotherapy (e.g., doxorubicin, daunorubicin, epirubicin) recent or ongoing  radiation therapy recent or planning to have surgery stroke an unusual or allergic reaction to bevacizumab, hamster proteins, mouse proteins, other medicines, foods, dyes, or preservatives pregnant or trying to get pregnant breast-feeding How should I use this medication? This medicine is for infusion into a vein. It is given by a health care professional in a hospital or clinic setting. Talk to your pediatrician regarding the use of this medicine in children. Special care may be needed. Overdosage: If you think you have taken too much of this medicine contact a poison control center or emergency room at once. NOTE: This medicine is only for you. Do not share this medicine with others. What if I miss a dose? It is important not to miss your dose. Call your doctor or health care professional if you are unable to keep an appointment. What may interact with this medication? Interactions are not expected. This list may not describe all possible interactions. Give your health care provider a list of all the medicines, herbs, non-prescription drugs, or dietary supplements you use. Also tell them if you smoke, drink alcohol, or use illegal drugs. Some items may interact with your medicine. What should I watch for while using this medication? Your condition will be monitored carefully while you are receiving this medicine. You will need important blood work and urine testing done while you are taking this medicine. This medicine may increase your risk to bruise or bleed. Call your doctor or health care professional if you notice any unusual bleeding. Before having surgery, talk to your health care provider to make sure it is ok. This drug can increase the risk of poor healing of your surgical site or wound. You will need to stop this drug for 28 days before surgery. After surgery, wait at least 28 days before restarting this drug. Make sure the surgical site or wound is healed enough before restarting  this drug. Talk to your health care provider if questions. Do not become pregnant while taking this medicine or for 6 months after stopping it. Women should inform their doctor if they wish to become pregnant or think they might be pregnant. There is a potential for serious side effects to an unborn child. Talk to your health care professional or pharmacist for more information. Do not breast-feed an infant while taking this medicine and for 6 months after the last dose. This medicine has caused ovarian failure in some women. This medicine may interfere with the ability to have a child. You should talk to your doctor or health care professional if you are concerned about your fertility. What side effects may I notice from receiving this medication? Side effects that you should report to your doctor or health care professional as soon as possible: allergic reactions like skin rash, itching or hives, swelling of the face, lips, or tongue chest pain or chest tightness chills coughing up blood high fever seizures severe constipation signs and symptoms of bleeding such as bloody or black, tarry stools; red or dark-brown urine; spitting up blood or brown material that looks like coffee grounds; red spots on the skin; unusual bruising or bleeding from the eye, gums, or nose signs and symptoms of a blood clot such as breathing problems;  chest pain; severe, sudden headache; pain, swelling, warmth in the leg signs and symptoms of a stroke like changes in vision; confusion; trouble speaking or understanding; severe headaches; sudden numbness or weakness of the face, arm or leg; trouble walking; dizziness; loss of balance or coordination stomach pain sweating swelling of legs or ankles vomiting weight gain Side effects that usually do not require medical attention (report to your doctor or health care professional if they continue or are bothersome): back pain changes in taste decreased appetite dry  skin nausea tiredness This list may not describe all possible side effects. Call your doctor for medical advice about side effects. You may report side effects to FDA at 1-800-FDA-1088. Where should I keep my medication? This drug is given in a hospital or clinic and will not be stored at home. NOTE: This sheet is a summary. It may not cover all possible information. If you have questions about this medicine, talk to your doctor, pharmacist, or health care provider.  2023 Elsevier/Gold Standard (2021-04-24 00:00:00)

## 2021-10-27 ENCOUNTER — Other Ambulatory Visit: Payer: Commercial Managed Care - PPO

## 2021-10-29 ENCOUNTER — Other Ambulatory Visit: Payer: Self-pay | Admitting: Hematology

## 2021-10-29 DIAGNOSIS — C186 Malignant neoplasm of descending colon: Secondary | ICD-10-CM

## 2021-10-30 ENCOUNTER — Other Ambulatory Visit: Payer: Self-pay | Admitting: Hematology

## 2021-11-03 ENCOUNTER — Inpatient Hospital Stay: Payer: Commercial Managed Care - PPO

## 2021-11-03 ENCOUNTER — Inpatient Hospital Stay: Payer: Commercial Managed Care - PPO | Admitting: Nurse Practitioner

## 2021-11-06 ENCOUNTER — Telehealth: Payer: Self-pay | Admitting: Hematology

## 2021-11-06 NOTE — Telephone Encounter (Signed)
Called patient regarding upcoming appointments, left a voicemail. 

## 2021-11-10 NOTE — Progress Notes (Addendum)
Dave Vaughn   [phone] [phone]   Clinic Follow up Note   Patient Care Team: [name], MD (Inactive) as PCP - General (Family Medicine) [name], MD as Consulting Physician (Cardiology) [name], MD as Consulting Physician (Gastroenterology) [name], MD as Consulting Physician (General Surgery) [name], MD as Consulting Physician (General Surgery) [name], MD as Consulting Physician (Urology) [name], MD as Consulting Physician (Medical Oncology) 11/12/2021  CHIEF COMPLAINT:  Follow up metastatic colon cancer   SUMMARY OF ONCOLOGIC HISTORY: Oncology History Overview Note   Cancer Staging  Cancer of left colon [facility]) Staging form: Colon and Rectum, AJCC 8th Edition - Pathologic stage from 01/11/2018: Stage IIIB (pT3, pN1c, cM0) - Signed by [name], MD on 01/16/2018 Total positive nodes: 0 Histologic grading system: 4 grade system Histologic grade (G): G2     Cancer of left colon ([place])  01/10/2018 Imaging   CT AP W Contrast 01/10/18  IMPRESSION: Irregular soft tissue density causing stricture of the mid descending colon likely the site of obstruction for the dilated small bowel. 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 No evidence of perforation.   Equivocal findings involving the appendix measuring 1.2 cm at the appendiceal tip with mucosal enhancement. No adjacent free fluid or inflammatory change. Findings are nonspecific, but can be seen with early acute appendicitis.   Mild prostatic enlargement. Increased density over the posterior bladder base likely due to the large prostatic impression although cannot completely exclude a bladder mass. Urology protocol CT or ultrasound may be helpful for better evaluation.   Mild cholelithiasis.   Stable 1.5 cm cystic structure over the lower pole right kidney likely slightly hyperdense cyst.   Diverticulosis of the colon.   Aortic  Atherosclerosis (ICD10-I70.0).   01/11/2018 Cancer Staging   Staging form: Colon and Rectum, AJCC 8th Edition - Pathologic stage from 01/11/2018: Stage IIIB (pT3, pN1c, cM0) - Signed by [name], MD on 01/16/2018   01/11/2018 Surgery   LEFT COLON RESECTION, TAKEDOWN SPLENIC FLEXURE, COLOSTOMY by Dr. [name]    01/11/2018 Procedure   Colonoscopy 01/11/18 by Dr. [name]  - Malignant completely obstructing tumor in the mid descending colon. Tattooed. - Diverticulosis in the sigmoid colon. - Non-bleeding internal hemorrhoids. - No specimens collected.   01/11/2018 Pathology Results   Diagnosis 01/11/18  1. Colon, segmental resection for tumor, descending colon - INVASIVE COLORECTAL ADENOCARCINOMA, 4 CM. - TUMOR EXTENDS INTO PERICOLONIC CONNECTIVE TISSUE. - TUMOR FOCALLY INVOLVES RADIAL MARGIN. - ONE MESENTERIC TUMOR DEPOSIT. - THIRTEEN BENIGN LYMPH NODES (0/13). 2. Colon, segmental resection, splenic flexure - BENIGN COLON. - NO EVIDENCE OF MALIGNANCY .   01/11/2018 Tumor Marker   Baseline CEA at 3.4   01/16/2018 Initial Diagnosis   Cancer of left colon ([place])   01/23/2018 Imaging   CT CHEST WO CONTRAST IMPRESSION: 1. No evidence for metastatic disease within the chest. 2. Small left pleural effusion with underlying opacities which may represent atelectasis. Right basilar atelectasis. 3. Few foci of gas within the upper abdomen in the omentum with surrounding fat stranding, likely postsurgical 4. Aortic Atherosclerosis (ICD10-I70.0).   03/08/2018 - 05/15/2018 Chemotherapy   adjuvant FOLOFX. Due to side effects of neuropahty Oxaliplatin was stopped after 3 cycles and chemo was stopped after 6 cycles. He declined completing 6 months of chemo treatment.     03/19/2018 Imaging   03/19/2018 CT AP IMPRESSION: 1. Interval partial left hemicolectomy and descending colostomy. 2. Heterogeneous soft tissue density along the left  anterior renal fascia is most likely postoperative (favor fat necrosis).  No well-defined fluid collection. 3. Mild left lower quadrant edema, new since 01/10/2018. This could be postoperative. Superimposed sigmoid diverticulitis and/or cystitis cannot be excluded. 4. Subtle hyperenhancing nodule within the anterior bladder dome cannot be excluded. Consider nonemergent cystoscopy. When this is performed, recommend attention to the left ureterovesicular junction and distal left ureter to evaluate questionable soft tissue fullness. 5. Cholelithiasis. 6.  Aortic Atherosclerosis (ICD10-I70.0). 7. Prostatomegaly.   11/13/2018 Imaging   CT CAP WO Contrast 11/13/18  IMPRESSION: 1. Reversal of left lower quadrant colostomy with sigmoid colon anastomosis. No complicating features. No findings for residual or recurrent tumor or metastatic disease involving the chest, abdomen or pelvis without contrast. 2. No acute abdominal/pelvic findings. 3. Gallbladder sludge and gallstones but no findings for acute cholecystitis. 4. Stable anterior abdominal wall hernia. 5. The right testicle is in the right inguinal canal.   10/03/2019 Imaging   CT CAP WO contrast  IMPRESSION: 1. New rounded density interposed between the prostate gland and anterior upper rectal wall could represent adenopathy or local extension of anterior rectal tumor. 2. Marked prostatomegaly, prostate volume 150 cubic cm. 3. Other imaging findings of potential clinical significance: Aortic Atherosclerosis (ICD10-I70.0). Coronary atherosclerosis. Trace right pleural effusion. Suspected cholelithiasis. Nonobstructive left nephrolithiasis. Multilevel lumbar impingement. Bilateral mildly retracted testicles. Hypodense exophytic lesion of the right kidney, most likely to be a cyst.   11/02/2019 Procedure   colonoscopy on 11/02/2019 by Dr. [name] Plan showed normal digital rectal exam, 3 polyps in the rectum, descending colon and cecum, and a prior sigmoid: Anastomosis characterized by erythema.  He found an  extrinsic nonobstructing medium-sized mass in the proximal rectum about 4 cm in length, no internal rectal mass.   Diagnosis Surgical [P], colon, cecum, descending, rectal, polyp (3) - TUBULAR ADENOMA (TWO) - NO HIGH GRADE DYSPLASIA OR CARCINOMA. - COLONIC FRAGMENT WITH BENIGN LYMPHOID AGGREGATE.   12/07/2019 Imaging   MRI pelvis IMPRESSION: 1. Masslike area in the rectum suspicious for rectal neoplasm, likely T4b based on the appearance of soft tissue extending along the anterior peritoneal reflection and into the seminal vesicles. Correlation with recent colonoscopy results may be helpful. Area of anastomosis and other areas of the pelvis are not imaged on today's exam.   12/21/2019 PET scan   IMPRESSION: 1. Unfortunately evidence for peritoneal metastasis. Intensely hypermetabolic nodules along the LEFT pericolic gutter. Favor hypermetabolic mass anterior to the rectum to represent serosal implant along the ventral surface of the rectum. 2. local recurrence within the LEFT abdominal wall at site prior colostomy. Intense hypermetabolic thickening of the rectus muscle at this site. 3. Two hypermetabolic hepatic metastasis.   01/15/2020 Relapse/Recurrence   FINAL MICROSCOPIC DIAGNOSIS:   A. SOFT TISSUE, LEFT ABDOMINAL WALL, BIOPSY:  - Adenocarcinoma.  - See comment.   COMMENT:   The morphology is consistent with metastatic colorectal adenocarcinoma.    01/28/2020 -  Chemotherapy   First line FOLFIRI q2weeks starting 01/28/20 for 8 cycles.  -----Bevacizumab added with C2.  -----Changed to maintenance Xeloda 2000 mg twice daily for 2 weeks on/1 week off and bevacizumab 06/02/20. Starting with C3, dose reduce to 1584m BID due to skin toxicity.   05/05/2020 Imaging   IMPRESSION: 1. Improved appearance, with reduced size of the hepatic metastatic lesions and reduced size of the peritoneal tumor implants. 2. Other imaging findings of potential clinical significance: Notable  prostatomegaly. Multilevel impingement in the lumbar spine. Dependent density in the gallbladder possibly from sludge  or gallstones. Degenerative glenohumeral arthropathy bilaterally. 3. Aortic atherosclerosis.   08/15/2020 Imaging   CT CAP  IMPRESSION: Chest Impression:   No evidence of thoracic metastasis   Abdomen / Pelvis Impression:   1. Hepatic metastasis are no longer measurable by CT imaging. 2. Peritoneal nodular metastasis in the LEFT abdomen are decreased in size. 3. No evidence of new peritoneal disease. 4. Thickening and LEFT rectus muscle at site of prior metastasis. No interval change. 5. No evidence of new or progressive colorectal carcinoma.     11/27/2020 Imaging   CT CAP  IMPRESSION: 1. There are multiple small pulmonary nodules in the right upper lobe that are new or enlarged compared to prior examination but measuring 4 mm or smaller, suspicious for pulmonary metastatic disease. 2. Unchanged peritoneal nodule adjacent to the tip of the spleen measuring 1.3 x 1.2 cm. Slightly peritoneal nodule adjacent to the splenic flexure measuring no greater than 6 mm, previously 8 mm. 3. No significant change in previously hypermetabolic soft tissue mass centered about the left lower quadrant colostomy site. 4. Previously established hypermetabolic hepatic metastatic disease remains inapparent by CT. 5. Status post sigmoid colon resection and reanastomosis. 6. Prostatomegaly with thickening of the decompressed urinary bladder, likely secondary to chronic outlet obstruction. 7. Cholelithiasis.   Aortic Atherosclerosis (ICD10-I70.0).   01/29/2021 Imaging   CT CAP   IMPRESSION: 1. Minimal interval progression of bilateral pulmonary nodules, concerning for metastatic disease. 2. No substantial change in size of the peritoneal nodule at the inferior tip of the spleen and splenic flexure nodule. 3. Nodular soft tissue measured at the ostomy site previously is  not evident today. 4. Marked prostatomegaly. 5. Cholelithiasis. 6. Aortic Atherosclerosis (ICD10-I70.0).   04/06/2021 Imaging   EXAM: CT ABDOMEN AND PELVIS WITHOUT CONTRAST (Renal Stone Protocol)  IMPRESSION: 1. Bladder wall thickening with surrounding inflammation concerning for cystitis. 2. Partial colectomy. Colon is air-filled with relative transition just proximal to the anastomosis. Stricture or recurrence at this level would be difficult to exclude. No focal mass identified. 3. There is a new subcutaneous fluid collection at the level of prior ostomy in the left abdominal wall. Infection not excluded. 4. There is new intramuscular hyperdensity at the level of the prior ostomy, indeterminate. Findings may related to scarring, tumor recurrence or metastatic disease is not excluded. 5. Stable nodularity in the left upper quadrant. 6. Cholelithiasis. 7. Trace right pleural effusion. 8. Stable prostatomegaly. 9.  Aortic Atherosclerosis (ICD10-I70.0).   05/05/2021 Pathology Results   FINAL MICROSCOPIC DIAGNOSIS:   A. ABDOMINAL WALL SCAR, EXCISION:  -  Adenocarcinoma  -  See comment   COMMENT:  Morphologically consistent with colonic adenocarcinoma (similar to  previously reported ([id number]).   06/02/2021 Imaging   EXAM: CT CHEST, ABDOMEN, AND PELVIS WITH CONTRAST  IMPRESSION: 1. Progressive multifocal pulmonary metastatic disease. 2. New low-density hepatic lesions, likely metastases. 3. Progressive peritoneal implant near the splenic flexure of the colon. Small peritoneal implant inferior to the spleen appears unchanged. 4. Increased soft tissue nodularity near the midline incision in the upper anterior abdominal wall, suspicious for tumor recurrence at the incision. This could reflect keloid formation. 5. Fluid collection in the left anterior abdominal wall attributed to recent abdominal surgery with associated small incisional hernia containing fat. 6.  Additional incidental findings including prostatomegaly, cholelithiasis and Aortic Atherosclerosis (ICD10-I70.0).   06/11/2021 -  Chemotherapy   Patient is on Treatment Plan : COLORECTALXeloda + Bevacizumab q21d     07/29/2021 Imaging   EXAMINATION: CT ABDOMEN  PELVIS WO CONTRAST  Impression  1. Left anterior abdominal wall postsurgical changes with no recurrent hernia.  2. Stable indeterminate 3 cm ventral abdominal wall mass located at the L1-2 level. Question scarring from prior surgery versus neoplasm.  3. Cholelithiasis.  4. Stable indeterminate bibasilar lung nodules. All of with a CT chest is recommended.  5. Indeterminate stable right hepatic lesions. Consider follow-up with an abdominal MRI with and without contrast for additional characterization.       CURRENT THERAPY: Bevacizumab/Xeloda, restarted 08/12/21; Xeloda dose 1500 mg BID days 1-14 q21 days; reduced to 1000 mg AM/1500 mg PM days 1-14 q21 days on 5/18 due to renal dysfunction and hand/foot syndrome  INTERVAL HISTORY: Mr. Trebilcock returns for follow up and treatment as scheduled. Last seen by Dr. Burr Medico 4/17 and my colleague Cassie, [place] 5/18. He continues q3 week beva and Xeloda which was recently reduced to 1000 mg AM and 1500 mg PM for renal dysfunction and hand-foot syndrome. He tolerated this dose better. Still has dryness and cracks but improved, he continues lotion. He is feeling well in general except still having dull/sore abdominal discomfort.  Pain ranges from a 3-8 out of 10.  He wears an abdominal support band but this gets sore after a while.  If he needs another surgery to help the pain he is open to that.  Denies any other new or specific complaints.  He began the current Xeloda cycle on 6/5.  All other systems were reviewed with the patient and are negative.  MEDICAL HISTORY:  Past Medical History:  Diagnosis Date   Adenocarcinoma, colon ([place]) dx'd 01/2018   Anemia    taking iron supplements   Anxiety    Atrial  fibrillation with RVR ([facility])    Colonic obstruction ([place]) 01/10/2018   Depression    Diverticulitis    Dysrhythmia    afib   History of kidney stones    Hypertension    Indwelling Foley catheter present    due to UTI    SURGICAL HISTORY: Past Surgical History:  Procedure Laterality Date   ANKLE SURGERY Left    when he was in college   COLON RESECTION N/A 01/11/2018   Procedure: LEFT COLON RESECTION, TAKEDOWN SPLENIC FLEXURE, COLOSTOMY;  Surgeon: [name], MD;  Location: [facility];  Service: General;  Laterality: N/A;   COLONOSCOPY  01/11/2018   Procedure: COLONOSCOPY;  Surgeon: Jackquline Denmark, MD;  Location: [facility];  Service: Endoscopy;;   COLONOSCOPY  05/10/2018   colonscopy  05/10/2018   EXCISION OF KELOID N/A 05/05/2021   Procedure: ABDOMINAL SCAR EXCISION;  Surgeon: Ralene Ok, MD;  Location: [place];  Service: General;  Laterality: N/A;   FOREIGN BODY REMOVAL ABDOMINAL N/A 03/09/2021   Procedure: EXCISION OF FOREIGN BODY;  Surgeon: Ralene Ok, MD;  Location: [place];  Service: General;  Laterality: N/A;   HERNIA REPAIR     HERNIA REPAIR  03/02/2019   EXPLORATORY LAPAROTOMY (N/A Abdomen)   INCISIONAL HERNIA REPAIR N/A 03/02/2019   Procedure: INCISIONAL HERNIA REPAIR , RECTORECTUS VS TAR HERNIA REPAIR;  Surgeon: Ralene Ok, MD;  Location: [place];  Service: General;  Laterality: N/A;   INSERTION OF MESH N/A 03/02/2019   Procedure: Insertion Of Mesh;  Surgeon: Ralene Ok, MD;  Location: [place];  Service: General;  Laterality: N/A;   LAPAROTOMY N/A 03/02/2019   Procedure: EXPLORATORY LAPAROTOMY;  Surgeon: Ralene Ok, MD;  Location: [place];  Service: General;  Laterality: N/A;   LAPAROTOMY N/A 03/09/2021  Procedure: EXPLORATION OF ABDOMINAL WALL;  Surgeon: Ralene Ok, MD;  Location: [place];  Service: General;  Laterality: N/A;   LYSIS OF ADHESION N/A 06/12/2018   Procedure: LYSIS OF ADHESIONS;  Surgeon: [name], MD;  Location: [facility];  Service: General;   Laterality: N/A;   LYSIS OF ADHESION N/A 03/02/2019   Procedure: Lysis Of Adhesion;  Surgeon: Ralene Ok, MD;  Location: [place];  Service: General;  Laterality: N/A;   PORTACATH PLACEMENT Right 03/07/2018   Procedure: INSERTION PORT-A-CATH RIGHT SUBCLAVIAN;  Surgeon: [name], MD;  Location: [place];  Service: General;  Laterality: Right;   PROCTOSCOPY N/A 06/12/2018   Procedure: RIGID PROCTOSCOPY;  Surgeon: [name], MD;  Location: [facility];  Service: General;  Laterality: N/A;   thumb surgery   2018   cyst removal    I have reviewed the social history and family history with the patient and they are unchanged from previous note.  ALLERGIES:  has No Known Allergies.  MEDICATIONS:  Current Outpatient Medications  Medication Sig Dispense Refill   apixaban (ELIQUIS) 5 MG TABS tablet Take 1 tablet (5 mg total) by mouth 2 (two) times daily. 60 tablet 6   b complex vitamins tablet Take 1 tablet by mouth daily.     capecitabine (XELODA) 500 MG tablet TAKE 3 TABLETS BY MOUTH EVERY 12 HOURS AFTER A MEAL FOR 14 DAYS  ON THEN 7 DAYS OFF 84 tablet 1   diltiazem (CARDIZEM CD) 360 MG 24 hr capsule TAKE 1 CAPSULE(360 MG) BY MOUTH DAILY 90 capsule 3   finasteride (PROSCAR) 5 MG tablet Take 5 mg by mouth daily.     levothyroxine (SYNTHROID) 75 MCG tablet TAKE 1 TABLET(75 MCG) BY MOUTH DAILY 90 tablet 1   lidocaine-prilocaine (EMLA) cream Apply 1 application topically as needed. 30 g 1   Multiple Vitamins-Iron (MULTIVITAMIN/IRON PO) Take 1 tablet by mouth daily.      tamsulosin (FLOMAX) 0.4 MG CAPS capsule Take 0.4 mg by mouth 2 (two) times daily.      No current facility-administered medications for this visit.    PHYSICAL EXAMINATION: ECOG PERFORMANCE STATUS: 1 - Symptomatic but completely ambulatory  Vitals:   11/12/21 1152  BP: (!) 140/92  Pulse: 91  Resp: 15  Temp: 98.4 F (36.9 C)  SpO2: 97%   Filed Weights   11/12/21 1152  Weight: 224 lb 12.8 oz (102 kg)     GENERAL:alert, no distress and comfortable SKIN: No rash.  Palms with mild erythema, moderate peeling and dryness, no major cracks EYES: sclera clear LUNGS: clear with normal breathing effort HEART: regular rate & rhythm, no lower extremity edema ABDOMEN:abdomen soft, non-tender and normal bowel sounds.  Palpable soft tissue firmness at the left abdomen near the umbilicus, no discrete mass NEURO: alert & oriented x 3 with fluent speech, no focal motor deficits PAC without erythema  LABORATORY DATA:  I have reviewed the data as listed    Latest Ref Rng & Units 11/12/2021   11:36 AM 10/22/2021    9:59 AM 09/21/2021   10:10 AM  CBC  WBC 4.0 - 10.5 K/uL 7.9  6.0  5.6   Hemoglobin 13.0 - 17.0 g/dL 15.0  15.2  14.1   Hematocrit 39.0 - 52.0 % 43.2  44.1  41.1   Platelets 150 - 400 K/uL 192  182  201         Latest Ref Rng & Units 11/12/2021   11:36 AM 10/22/2021    9:59  AM 09/21/2021   10:10 AM  CMP  Glucose 70 - 99 mg/dL 86  121  123   BUN 8 - 23 mg/dL 18  26  19    Creatinine 0.61 - 1.24 mg/dL 1.02  1.40  1.17   Sodium 135 - 145 mmol/L 138  139  142   Potassium 3.5 - 5.1 mmol/L 4.0  4.1  3.9   Chloride 98 - 111 mmol/L 102  105  107   CO2 22 - 32 mmol/L 31  30  29    Calcium 8.9 - 10.3 mg/dL 9.5  9.3  8.8   Total Protein 6.5 - 8.1 g/dL 7.1  7.1  6.6   Total Bilirubin 0.3 - 1.2 mg/dL 1.2  1.0  0.6   Alkaline Phos 38 - 126 U/L 105  90  91   AST 15 - 41 U/L 19  25  21    ALT 0 - 44 U/L 16  22  15        RADIOGRAPHIC STUDIES: I have personally reviewed the radiological images as listed and agreed with the findings in the report. No results found.   ASSESSMENT & PLAN: DELSHON BLANCHFIELD is a 76 y.o. male with      1. Cancer of left colon, adenocarcinoma, stage IIIB (pT3N1cM0), MSI-stable, KRAS+ -Diagnosed in 01/2018. Treated with surgery and adjuvant FOLFOX. Due to side effects Oxaliplatin was stopped after 3 cycles and chemo was stopped after 6 cycles. He declined completing 6  months of chemo treatment.  -unfortunately, he developed local recurrence in left abdominal wall and peritoneal metastasis, 2 hypermetabolic liver lets in 01/164. Abdominal wall biopsy conformed metastatic colorectal adenocarcinoma in 01/2020  -Began first line dose-reduced FOLFIRI on 01/28/2020 -[facility] showed Kras (+) positive, he is not a candidate for EGFR inhibitor.  Bevacizumab was added with cycle 2  -S/p 8 cycles of FOLFIRI and bevacizumab from cycle 2; tolerated well with fatigue, nausea, diarrhea. CEA normalized after cycle 4, then switched to maintenance xeloda and bevacizumab 06/02/2020.  Beva was held 01/13/2021 - 06/10/2021 due to proteinuria -He developed a hernia and has undergone multiple surgeries for repair and scar excision by Dr. Rosendo Gros, a scar excision on 05/05/2021 Path revealed adenocarcinoma -CT CAP 06/02/2021 showed progressive disease in the lung, liver, colon, and abdominal wall -He opted to restart CAPOX. He restarted oxaliplatin and bevacizumab on 06/11/21. He tolerated poorly with a number of side effects, thus oxali was discontinued and beva was held. He was able to recover. -he had CT AP on 07/29/21 in [place] (in [place]) showing: no recurrent hernia, stable indeterminate 3 cm ventral abdominal wall mass, bibasilar lung nodules, and right hepatic lesions.  -he restarted bevacizumab on 08/12/21 and Xeloda on 08/13/21 at 1500 mg twice daily for 2 weeks on/1 week off -chest CT on 09/16/21 showed: stable bilateral pulmonary nodules and peritoneal disease but worsening liver metastasis.  This is probably related to his treatment interruption during surgeries.  I personally reviewed the scan images and discussed the results with him today. I discussed the radiologists compared this scan to CT CAP in 05/2021 instead of from 07/2021.  -Mr. Keast appears stable.  He continues q3 week bevacizumab and Xeloda which was recently reduced to 1000 mg a.m./1500 mg p.m. for renal dysfunction  and hand/foot syndrome, he tolerated that dose better, still moderate hand/foot syndrome but improved.  Kidney function has normalized -His main concern is the persistent abdominal pain secondary to hernia and subsequent surgeries.  I will  CC my note to Dr. Rosendo Gros. -Mr. Mayabb is otherwise clinically doing well, able to recover from treatment and function well with good performance status.  There is no clinical evidence of disease progression. -Labs reviewed, CBC stable, kidney function improved to normal, he is hydrating well.  We will follow-up on the pending CEA which has been slightly trending up recently.  -Due to his upcoming travel and horse show, we will give bevacizumab today at 5 mg/kg and he will return in 2 weeks.  On 6/22 we will increase it back to the 3-week dose and he will return for next treatment 7/13 -He knows to continue Xeloda at the current dose every 2 weeks on/1 week off -Follow-up in 2 weeks  2. Hypothyroidism -Previously on synthroid, TSH normal 2 years ago. He has not been taking synthroid for a while -TSH in 02/2020 up to 67, he went back on Synthroid since 02/16/2020   3.  BPH  -Followed by urologist Dr. Lovena Neighbours -Pelvic MRI on 7/1 shows marked prostatomegaly with signs of BPH extending into the bladder base.  -Denies new or worsening urinary symptoms   4. Atrial Fibrillation -continue Eliquis. Rate controlled.  He can hold Eliquis periodically for bleeding hemorrhoids and surgeries -Continue follow-up with cardiology   5. CKD stage III -he developed mild increased Cr since he started chemo, EGFR around 40-50's  -I encouraged him to control his blood pressure, cholesterol and BG, and hydrate -Will avoid NSAIDs and CT IV contrast if GFR low -Improved to normal from 1.4 at last visit 3 weeks ago    PLAN: -Labs reviewed, will f/up on the pending CEA  -Continue xeloda 1000 mg AM/1500 mg PM for 2 weeks on/1 week off, started 6/5 -due to upcoming travel, will  treat today with Bevacizumab at 5 mg/kg, then back to 7.5 mg/kg on 6/22, then again 3 weeks after -F/up 6/22 and 7/13 with beva; he knows to continue Xeloda 2 weeks on/1 week off. -Cancel 6/30, patient out of town 6/26 - 7/5 -I reviewed with pharmacy -CC note to Dr. Rosendo Gros   All questions were answered. The patient knows to call the clinic with any problems, questions or concerns. No barriers to learning was detected. I spent 20 minutes counseling the patient face to face. The total time spent in the appointment was 30 minutes and more than 50% was on counseling and review of test results and coordination of care.      Alla Feeling, NP 11/12/21

## 2021-11-12 ENCOUNTER — Other Ambulatory Visit: Payer: Self-pay

## 2021-11-12 ENCOUNTER — Inpatient Hospital Stay: Payer: Commercial Managed Care - PPO | Attending: Nurse Practitioner

## 2021-11-12 ENCOUNTER — Inpatient Hospital Stay: Payer: Commercial Managed Care - PPO

## 2021-11-12 ENCOUNTER — Inpatient Hospital Stay (HOSPITAL_BASED_OUTPATIENT_CLINIC_OR_DEPARTMENT_OTHER): Payer: Commercial Managed Care - PPO | Admitting: Nurse Practitioner

## 2021-11-12 ENCOUNTER — Encounter: Payer: Self-pay | Admitting: Nurse Practitioner

## 2021-11-12 ENCOUNTER — Telehealth: Payer: Self-pay | Admitting: Hematology

## 2021-11-12 VITALS — BP 140/92 | HR 91 | Temp 98.4°F | Resp 15 | Ht 70.0 in | Wt 224.8 lb

## 2021-11-12 DIAGNOSIS — I4891 Unspecified atrial fibrillation: Secondary | ICD-10-CM | POA: Insufficient documentation

## 2021-11-12 DIAGNOSIS — N183 Chronic kidney disease, stage 3 unspecified: Secondary | ICD-10-CM | POA: Insufficient documentation

## 2021-11-12 DIAGNOSIS — C787 Secondary malignant neoplasm of liver and intrahepatic bile duct: Secondary | ICD-10-CM | POA: Diagnosis not present

## 2021-11-12 DIAGNOSIS — C186 Malignant neoplasm of descending colon: Secondary | ICD-10-CM

## 2021-11-12 DIAGNOSIS — D124 Benign neoplasm of descending colon: Secondary | ICD-10-CM | POA: Diagnosis not present

## 2021-11-12 DIAGNOSIS — Z95828 Presence of other vascular implants and grafts: Secondary | ICD-10-CM

## 2021-11-12 DIAGNOSIS — C786 Secondary malignant neoplasm of retroperitoneum and peritoneum: Secondary | ICD-10-CM | POA: Diagnosis not present

## 2021-11-12 DIAGNOSIS — Z5112 Encounter for antineoplastic immunotherapy: Secondary | ICD-10-CM | POA: Diagnosis present

## 2021-11-12 DIAGNOSIS — Z7901 Long term (current) use of anticoagulants: Secondary | ICD-10-CM | POA: Insufficient documentation

## 2021-11-12 DIAGNOSIS — C7800 Secondary malignant neoplasm of unspecified lung: Secondary | ICD-10-CM | POA: Insufficient documentation

## 2021-11-12 DIAGNOSIS — E039 Hypothyroidism, unspecified: Secondary | ICD-10-CM | POA: Insufficient documentation

## 2021-11-12 DIAGNOSIS — Z79890 Hormone replacement therapy (postmenopausal): Secondary | ICD-10-CM | POA: Diagnosis not present

## 2021-11-12 LAB — CMP (CANCER CENTER ONLY)
ALT: 16 U/L (ref 0–44)
AST: 19 U/L (ref 15–41)
Albumin: 4.2 g/dL (ref 3.5–5.0)
Alkaline Phosphatase: 105 U/L (ref 38–126)
Anion gap: 5 (ref 5–15)
BUN: 18 mg/dL (ref 8–23)
CO2: 31 mmol/L (ref 22–32)
Calcium: 9.5 mg/dL (ref 8.9–10.3)
Chloride: 102 mmol/L (ref 98–111)
Creatinine: 1.02 mg/dL (ref 0.61–1.24)
GFR, Estimated: >60 mL/min (ref >60)
Glucose, Bld: 86 mg/dL (ref 70–99)
Potassium: 4 mmol/L (ref 3.5–5.1)
Sodium: 138 mmol/L (ref 135–145)
Total Bilirubin: 1.2 mg/dL (ref 0.3–1.2)
Total Protein: 7.1 g/dL (ref 6.5–8.1)

## 2021-11-12 LAB — CBC WITH DIFFERENTIAL (CANCER CENTER ONLY)
Abs Immature Granulocytes: 0.01 10*3/uL (ref 0.00–0.07)
Basophils Absolute: 0.1 10*3/uL (ref 0.0–0.1)
Basophils Relative: 1 %
Eosinophils Absolute: 0.1 10*3/uL (ref 0.0–0.5)
Eosinophils Relative: 1 %
HCT: 43.2 % (ref 39.0–52.0)
Hemoglobin: 15 g/dL (ref 13.0–17.0)
Immature Granulocytes: 0 %
Lymphocytes Relative: 29 %
Lymphs Abs: 2.3 10*3/uL (ref 0.7–4.0)
MCH: 32.6 pg (ref 26.0–34.0)
MCHC: 34.7 g/dL (ref 30.0–36.0)
MCV: 93.9 fL (ref 80.0–100.0)
Monocytes Absolute: 0.8 10*3/uL (ref 0.1–1.0)
Monocytes Relative: 10 %
Neutro Abs: 4.7 10*3/uL (ref 1.7–7.7)
Neutrophils Relative %: 59 %
Platelet Count: 192 10*3/uL (ref 150–400)
RBC: 4.6 MIL/uL (ref 4.22–5.81)
RDW: 15.5 % (ref 11.5–15.5)
WBC Count: 7.9 10*3/uL (ref 4.0–10.5)
nRBC: 0 % (ref 0.0–0.2)

## 2021-11-12 LAB — TOTAL PROTEIN, URINE DIPSTICK: Protein, ur: 30 mg/dL — AB

## 2021-11-12 LAB — CEA (IN HOUSE-CHCC): CEA (CHCC-In House): 36.99 ng/mL — ABNORMAL HIGH (ref 0.00–5.00)

## 2021-11-12 MED ORDER — DEXTROSE 5 % IV SOLN: Freq: Once | INTRAVENOUS | Status: DC

## 2021-11-12 MED ORDER — SODIUM CHLORIDE 0.9% FLUSH
10.0000 mL | INTRAVENOUS | Status: DC | PRN
  Administered 2021-11-12: 10 mL

## 2021-11-12 MED ORDER — SODIUM CHLORIDE 0.9 % IV SOLN
5.0000 mg/kg | Freq: Once | INTRAVENOUS | Status: AC
  Administered 2021-11-12: 500 mg via INTRAVENOUS
  Filled 2021-11-12: qty 4

## 2021-11-12 MED ORDER — HEPARIN SOD (PORK) LOCK FLUSH 100 UNIT/ML IV SOLN
500.0000 [IU] | Freq: Once | INTRAVENOUS | Status: AC | PRN
  Administered 2021-11-12: 500 [IU]

## 2021-11-12 MED ORDER — HEPARIN SOD (PORK) LOCK FLUSH 100 UNIT/ML IV SOLN: 500.0000 [IU] | Freq: Once | INTRAVENOUS | Status: DC | PRN

## 2021-11-12 MED ORDER — SODIUM CHLORIDE 0.9 % IV SOLN: Freq: Once | INTRAVENOUS | Status: AC

## 2021-11-12 NOTE — Telephone Encounter (Signed)
Scheduled follow-up appointment per 6/8 los. Patient is aware.

## 2021-11-12 NOTE — Patient Instructions (Signed)
[facility]  Discharge Instructions: °Thank you for choosing [facility] to provide your oncology and hematology care.  ° °If you have a lab appointment with the Cancer Center, please go directly to the Cancer Center and check in at the registration area. °  °Wear comfortable clothing and clothing appropriate for easy access to any Portacath or PICC line.  ° °We strive to give you quality time with your provider. You may need to reschedule your appointment if you arrive late (15 or more minutes).  Arriving late affects you and other patients whose appointments are after yours.  Also, if you miss three or more appointments without notifying the office, you may be dismissed from the clinic at the provider’s discretion.    °  °For prescription refill requests, have your pharmacy contact our office and allow 72 hours for refills to be completed.   ° °Today you received the following chemotherapy and/or immunotherapy agents: Zirabev    °  °To help prevent nausea and vomiting after your treatment, we encourage you to take your nausea medication as directed. ° °BELOW ARE SYMPTOMS THAT SHOULD BE REPORTED IMMEDIATELY: °*FEVER GREATER THAN 100.4 F (38 °C) OR HIGHER °*CHILLS OR SWEATING °*NAUSEA AND VOMITING THAT IS NOT CONTROLLED WITH YOUR NAUSEA MEDICATION °*UNUSUAL SHORTNESS OF BREATH °*UNUSUAL BRUISING OR BLEEDING °*URINARY PROBLEMS (pain or burning when urinating, or frequent urination) °*BOWEL PROBLEMS (unusual diarrhea, constipation, pain near the anus) °TENDERNESS IN MOUTH AND THROAT WITH OR WITHOUT PRESENCE OF ULCERS (sore throat, sores in mouth, or a toothache) °UNUSUAL RASH, SWELLING OR PAIN  °UNUSUAL VAGINAL DISCHARGE OR ITCHING  ° °Items with * indicate a potential emergency and should be followed up as soon as possible or go to the Emergency Department if any problems should occur. ° °Please show the CHEMOTHERAPY ALERT CARD or IMMUNOTHERAPY ALERT CARD at check-in to  the Emergency Department and triage nurse. ° °Should you have questions after your visit or need to cancel or reschedule your appointment, please contact [facility]  Dept: [phone]  and follow the prompts.  Office hours are 8:00 a.m. to 4:30 p.m. Monday - Friday. Please note that voicemails left after 4:00 p.m. may not be returned until the following business day.  We are closed weekends and major holidays. You have access to a nurse at all times for urgent questions. Please call the main number to the clinic Dept: [phone] and follow the prompts. ° ° °For any non-urgent questions, you may also contact your provider using MyChart. We now offer e-Visits for anyone 18 and older to request care online for non-urgent symptoms. For details visit [email or web address]. °  °Also download the MyChart app! Go to the app store, search "MyChart", open the app, select [facility], and log in with your MyChart username and password. ° °Due to Covid, a mask is required upon entering the hospital/clinic. If you do not have a mask, one will be given to you upon arrival. For doctor visits, patients may have 1 support person aged 18 or older with them. For treatment visits, patients cannot have anyone with them due to current Covid guidelines and our immunocompromised population.  ° °

## 2021-11-18 ENCOUNTER — Other Ambulatory Visit: Payer: Self-pay | Admitting: General Surgery

## 2021-11-18 DIAGNOSIS — R1032 Left lower quadrant pain: Secondary | ICD-10-CM

## 2021-11-19 ENCOUNTER — Other Ambulatory Visit: Payer: Self-pay | Admitting: *Deleted

## 2021-11-19 DIAGNOSIS — Z95828 Presence of other vascular implants and grafts: Secondary | ICD-10-CM

## 2021-11-25 ENCOUNTER — Ambulatory Visit
Admission: RE | Admit: 2021-11-25 | Discharge: 2021-11-25 | Disposition: A | Discharge status: Home / Self Care | Payer: Commercial Managed Care - PPO | Source: Ambulatory Visit | Attending: General Surgery | Admitting: General Surgery

## 2021-11-25 ENCOUNTER — Encounter: Payer: Self-pay | Admitting: Hematology

## 2021-11-25 DIAGNOSIS — R1032 Left lower quadrant pain: Secondary | ICD-10-CM

## 2021-11-25 MED ORDER — HEPARIN SOD (PORK) LOCK FLUSH 100 UNIT/ML IV SOLN
500.0000 [IU] | Freq: Once | INTRAVENOUS | Status: AC
  Administered 2021-11-25: 500 [IU] via INTRAVENOUS

## 2021-11-25 MED ORDER — SODIUM CHLORIDE 0.9% FLUSH
10.0000 mL | INTRAVENOUS | Status: DC | PRN
  Administered 2021-11-25: 10 mL via INTRAVENOUS

## 2021-11-25 MED ORDER — IOPAMIDOL (ISOVUE-300) INJECTION 61%
100.0000 mL | Freq: Once | INTRAVENOUS | Status: AC | PRN
  Administered 2021-11-25: 100 mL via INTRAVENOUS

## 2021-11-26 ENCOUNTER — Encounter: Payer: Self-pay | Admitting: Hematology

## 2021-11-26 ENCOUNTER — Inpatient Hospital Stay (HOSPITAL_BASED_OUTPATIENT_CLINIC_OR_DEPARTMENT_OTHER): Payer: Commercial Managed Care - PPO | Admitting: Hematology

## 2021-11-26 ENCOUNTER — Other Ambulatory Visit: Payer: Self-pay

## 2021-11-26 ENCOUNTER — Inpatient Hospital Stay: Payer: Commercial Managed Care - PPO

## 2021-11-26 VITALS — BP 154/92 | HR 78 | Temp 97.8°F | Resp 16 | Wt 225.5 lb

## 2021-11-26 DIAGNOSIS — C186 Malignant neoplasm of descending colon: Secondary | ICD-10-CM

## 2021-11-26 DIAGNOSIS — Z5112 Encounter for antineoplastic immunotherapy: Secondary | ICD-10-CM | POA: Diagnosis not present

## 2021-11-26 DIAGNOSIS — N1831 Chronic kidney disease, stage 3a: Secondary | ICD-10-CM

## 2021-11-26 LAB — CMP (CANCER CENTER ONLY)
ALT: 13 U/L (ref 0–44)
AST: 19 U/L (ref 15–41)
Albumin: 4.1 g/dL (ref 3.5–5.0)
Alkaline Phosphatase: 89 U/L (ref 38–126)
Anion gap: 4 — ABNORMAL LOW (ref 5–15)
BUN: 18 mg/dL (ref 8–23)
CO2: 30 mmol/L (ref 22–32)
Calcium: 9.3 mg/dL (ref 8.9–10.3)
Chloride: 106 mmol/L (ref 98–111)
Creatinine: 1.16 mg/dL (ref 0.61–1.24)
GFR, Estimated: >60 mL/min (ref >60)
Glucose, Bld: 93 mg/dL (ref 70–99)
Potassium: 4.1 mmol/L (ref 3.5–5.1)
Sodium: 140 mmol/L (ref 135–145)
Total Bilirubin: 0.7 mg/dL (ref 0.3–1.2)
Total Protein: 6.9 g/dL (ref 6.5–8.1)

## 2021-11-26 LAB — CBC WITH DIFFERENTIAL (CANCER CENTER ONLY)
Abs Immature Granulocytes: 0.02 10*3/uL (ref 0.00–0.07)
Basophils Absolute: 0 10*3/uL (ref 0.0–0.1)
Basophils Relative: 1 %
Eosinophils Absolute: 0.2 10*3/uL (ref 0.0–0.5)
Eosinophils Relative: 2 %
HCT: 41 % (ref 39.0–52.0)
Hemoglobin: 14.7 g/dL (ref 13.0–17.0)
Immature Granulocytes: 0 %
Lymphocytes Relative: 32 %
Lymphs Abs: 2.3 10*3/uL (ref 0.7–4.0)
MCH: 34.2 pg — ABNORMAL HIGH (ref 26.0–34.0)
MCHC: 35.9 g/dL (ref 30.0–36.0)
MCV: 95.3 fL (ref 80.0–100.0)
Monocytes Absolute: 0.6 10*3/uL (ref 0.1–1.0)
Monocytes Relative: 8 %
Neutro Abs: 4.1 10*3/uL (ref 1.7–7.7)
Neutrophils Relative %: 57 %
Platelet Count: 200 10*3/uL (ref 150–400)
RBC: 4.3 MIL/uL (ref 4.22–5.81)
RDW: 15.9 % — ABNORMAL HIGH (ref 11.5–15.5)
WBC Count: 7.1 10*3/uL (ref 4.0–10.5)
nRBC: 0 % (ref 0.0–0.2)

## 2021-11-26 MED ORDER — SODIUM CHLORIDE 0.9% FLUSH
10.0000 mL | INTRAVENOUS | Status: DC | PRN
  Administered 2021-11-26: 10 mL

## 2021-11-26 MED ORDER — HEPARIN SOD (PORK) LOCK FLUSH 100 UNIT/ML IV SOLN
500.0000 [IU] | Freq: Once | INTRAVENOUS | Status: AC | PRN
  Administered 2021-11-26: 500 [IU]

## 2021-11-26 MED ORDER — SODIUM CHLORIDE 0.9 % IV SOLN
7.5000 mg/kg | Freq: Once | INTRAVENOUS | Status: AC
  Administered 2021-11-26: 800 mg via INTRAVENOUS
  Filled 2021-11-26: qty 32

## 2021-11-26 MED ORDER — SODIUM CHLORIDE 0.9 % IV SOLN: Freq: Once | INTRAVENOUS | Status: AC

## 2021-11-26 NOTE — Progress Notes (Addendum)
[facility]   Telephone:(336) [phone] [phone]   Clinic Follow up Note   Patient Care Team: Wynn Banker, MD (Inactive) as PCP - General (Family Medicine) Rinaldo Cloud, MD as Consulting Physician (Cardiology) Meryl Dare, MD as Consulting Physician (Gastroenterology) Karie Soda, MD as Consulting Physician (General Surgery) Claud Kelp, MD as Consulting Physician (General Surgery) Rene Paci, MD as Consulting Physician (Urology) Malachy Mood, MD as Consulting Physician (Medical Oncology)  Date of Service:  11/26/2021  CHIEF COMPLAINT: f/u of metastatic colon cancer  CURRENT THERAPY:  Bevacizumab/Xeloda restarted 08/12/21             -Xeloda dose: 1500mg  BID, days 1-14 q21days  ASSESSMENT & PLAN:  Dave Vaughn is a 76 y.o. male with   1. Cancer of left colon, adenocarcinoma, stage IIIB (pT3N1cM0), MSI-stable, KRAS Q61H mutation (+), liver and peritoneal metastasis in 12/2019  -Diagnosed in 01/2018. S/p surgery and adjuvant FOLFOX. He did not tolerate well and declined additional treatment at that time. -restaging in 12/2019 revealed local recurrence in left abdominal wall and peritoneal metastasis, 2 hypermetabolic liver mets. Abdominal wall biopsy confirmed metastatic colorectal adenocarcinoma in 01/2020  -FO showed KRAS mutation (+), he is not a candidate for EGFR inhibitor.   -received 8 cycles FOLFIRI 8/23-12/8/21, Bevacizumab added with cycle 2. Tolerated well with fatigue, nausea, diarrhea. CEA normalized after cycle 4 -switched to maintenance xeloda and beva 06/02/20. Beva was held 01/13/21-06/10/21 due to high proteinuria -abdominal scar excision on 05/05/21 under Dr. Derrell Lolling. Pathology revealed adenocarcinoma.  -CT CAP on 06/02/21 showed progressive disease in lung, liver, colon, and abdominal wall. -He opted to restart CAPOX. He restarted oxaliplatin and bevacizumab on 06/11/21. He tolerated poorly with a number of side  effects, thus oxali was discontinued and beva was held. He was able to recover. -he had CT AP on 07/29/21 in [place] (in Care Everywhere) showing: no recurrent hernia, stable indeterminate 3 cm ventral abdominal wall mass, bibasilar lung nodules, and right hepatic lesions.  -he restarted bevacizumab on 08/12/21 and Xeloda on 08/13/21. He is tolerating well -restaging CT abdomen yesterday, 11/25/21, showed increase in hepatic metastasis and progression of nodularity along left abdominal wall. I reviewed the images and discussed the results with him today. -his CEA has also been rising, most recently up to 36.99 on 11/12/21, consistent with cancer progression  -I discussed treatment options with him today due to his cancer progression. I recommend adding back oxaliplatin with dose reduction. He previously tolerated poorly, so I would start him on a lower dose. I also discussed the option of FOLFIRI, which would include switching Xeloda to 5FU, or single agent irinotecan. He agrees with CAPOX, and would like to hold 5-fu pump as long as he can -labs reviewed, overall WNL. Will proceed with beva today and he will continue xeloda for this cycle. He is leaving for a planned vacation this weekend, so we will hold on adding or changing anything until next treatment in 3 weeks   2. Abdominal hernia with bowel obstruction -he underwent emergent hernia repair as well as small bowel resection on 06/16/21 while in [place]. Pathology was negative for malignancy (results available in Care Everywhere).   3. Hypothyroidism -Previously on synthroid, TSH normal 2 years ago. -TSH in 02/2020 up to 67, he went back on Synthroid since 02/16/20   4. BPH  -Followed by urologist Dr. Liliane Shi -Denies new or worsening urinary symptoms   5. Atrial Fibrillation -continue Eliquis. Rate controlled. He can  stop Eliquis periodically for bleeding hemorrhoids -Continue follow-up with cardiology   6. CKD stage III -he has developed mild increased Cr  since he started chemo, EGFR around 40-50's  -Improved      PLAN: -proceed with bevacizumab today -continue Xeloda at same dose  -lab, flush, f/u, and beva in 3 and 6 weeks, will add low dose Oxaliplatin 70mg /m2 back from next cycle    No problem-specific Assessment & Plan notes found for this encounter.   SUMMARY OF ONCOLOGIC HISTORY: Oncology History Overview Note   Cancer Staging  Cancer of left colon [facility]) Staging form: Colon and Rectum, AJCC 8th Edition - Pathologic stage from 01/11/2018: Stage IIIB (pT3, pN1c, cM0) - Signed by Malachy Mood, MD on 01/16/2018 Total positive nodes: 0 Histologic grading system: 4 grade system Histologic grade (G): G2     Cancer of left colon (HCC)  01/10/2018 Imaging   CT AP W Contrast 01/10/18  IMPRESSION: Irregular soft tissue density causing stricture of the mid descending colon likely the site of obstruction for the dilated small bowel. 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 No evidence of perforation.   Equivocal findings involving the appendix measuring 1.2 cm at the appendiceal tip with mucosal enhancement. No adjacent free fluid or inflammatory change. Findings are nonspecific, but can be seen with early acute appendicitis.   Mild prostatic enlargement. Increased density over the posterior bladder base likely due to the large prostatic impression although cannot completely exclude a bladder mass. Urology protocol CT or ultrasound may be helpful for better evaluation.   Mild cholelithiasis.   Stable 1.5 cm cystic structure over the lower pole right kidney likely slightly hyperdense cyst.   Diverticulosis of the colon.   Aortic Atherosclerosis (ICD10-I70.0).   01/11/2018 Cancer Staging   Staging form: Colon and Rectum, AJCC 8th Edition - Pathologic stage from 01/11/2018: Stage IIIB (pT3, pN1c, cM0) - Signed by Malachy Mood, MD on 01/16/2018   01/11/2018 Surgery   LEFT COLON RESECTION, TAKEDOWN SPLENIC FLEXURE, COLOSTOMY by Dr. Derrell Lolling     01/11/2018 Procedure   Colonoscopy 01/11/18 by Dr. Chales Abrahams  - Malignant completely obstructing tumor in the mid descending colon. Tattooed. - Diverticulosis in the sigmoid colon. - Non-bleeding internal hemorrhoids. - No specimens collected.   01/11/2018 Pathology Results   Diagnosis 01/11/18  1. Colon, segmental resection for tumor, descending colon - INVASIVE COLORECTAL ADENOCARCINOMA, 4 CM. - TUMOR EXTENDS INTO PERICOLONIC CONNECTIVE TISSUE. - TUMOR FOCALLY INVOLVES RADIAL MARGIN. - ONE MESENTERIC TUMOR DEPOSIT. - THIRTEEN BENIGN LYMPH NODES (0/13). 2. Colon, segmental resection, splenic flexure - BENIGN COLON. - NO EVIDENCE OF MALIGNANCY .   01/11/2018 Tumor Marker   Baseline CEA at 3.4   01/16/2018 Initial Diagnosis   Cancer of left colon (HCC)   01/23/2018 Imaging   CT CHEST WO CONTRAST IMPRESSION: 1. No evidence for metastatic disease within the chest. 2. Small left pleural effusion with underlying opacities which may represent atelectasis. Right basilar atelectasis. 3. Few foci of gas within the upper abdomen in the omentum with surrounding fat stranding, likely postsurgical 4. Aortic Atherosclerosis (ICD10-I70.0).   03/08/2018 - 05/15/2018 Chemotherapy   adjuvant FOLOFX. Due to side effects of neuropahty Oxaliplatin was stopped after 3 cycles and chemo was stopped after 6 cycles. He declined completing 6 months of chemo treatment.     03/19/2018 Imaging   03/19/2018 CT AP IMPRESSION: 1. Interval partial left hemicolectomy and descending colostomy. 2. Heterogeneous soft tissue density along the left anterior renal fascia is most likely postoperative (favor  fat necrosis). No well-defined fluid collection. 3. Mild left lower quadrant edema, new since 01/10/2018. This could be postoperative. Superimposed sigmoid diverticulitis and/or cystitis cannot be excluded. 4. Subtle hyperenhancing nodule within the anterior bladder dome cannot be excluded. Consider nonemergent  cystoscopy. When this is performed, recommend attention to the left ureterovesicular junction and distal left ureter to evaluate questionable soft tissue fullness. 5. Cholelithiasis. 6.  Aortic Atherosclerosis (ICD10-I70.0). 7. Prostatomegaly.   11/13/2018 Imaging   CT CAP WO Contrast 11/13/18  IMPRESSION: 1. Reversal of left lower quadrant colostomy with sigmoid colon anastomosis. No complicating features. No findings for residual or recurrent tumor or metastatic disease involving the chest, abdomen or pelvis without contrast. 2. No acute abdominal/pelvic findings. 3. Gallbladder sludge and gallstones but no findings for acute cholecystitis. 4. Stable anterior abdominal wall hernia. 5. The right testicle is in the right inguinal canal.   10/03/2019 Imaging   CT CAP WO contrast  IMPRESSION: 1. New rounded density interposed between the prostate gland and anterior upper rectal wall could represent adenopathy or local extension of anterior rectal tumor. 2. Marked prostatomegaly, prostate volume 150 cubic cm. 3. Other imaging findings of potential clinical significance: Aortic Atherosclerosis (ICD10-I70.0). Coronary atherosclerosis. Trace right pleural effusion. Suspected cholelithiasis. Nonobstructive left nephrolithiasis. Multilevel lumbar impingement. Bilateral mildly retracted testicles. Hypodense exophytic lesion of the right kidney, most likely to be a cyst.   11/02/2019 Procedure   colonoscopy on 11/02/2019 by Dr. Russella Dar showed normal digital rectal exam, 3 polyps in the rectum, descending colon and cecum, and a prior sigmoid: Anastomosis characterized by erythema.  He found an extrinsic nonobstructing medium-sized mass in the proximal rectum about 4 cm in length, no internal rectal mass.   Diagnosis Surgical [P], colon, cecum, descending, rectal, polyp (3) - TUBULAR ADENOMA (TWO) - NO HIGH GRADE DYSPLASIA OR CARCINOMA. - COLONIC FRAGMENT WITH BENIGN LYMPHOID AGGREGATE.    12/07/2019 Imaging   MRI pelvis IMPRESSION: 1. Masslike area in the rectum suspicious for rectal neoplasm, likely T4b based on the appearance of soft tissue extending along the anterior peritoneal reflection and into the seminal vesicles. Correlation with recent colonoscopy results may be helpful. Area of anastomosis and other areas of the pelvis are not imaged on today's exam.   12/21/2019 PET scan   IMPRESSION: 1. Unfortunately evidence for peritoneal metastasis. Intensely hypermetabolic nodules along the LEFT pericolic gutter. Favor hypermetabolic mass anterior to the rectum to represent serosal implant along the ventral surface of the rectum. 2. local recurrence within the LEFT abdominal wall at site prior colostomy. Intense hypermetabolic thickening of the rectus muscle at this site. 3. Two hypermetabolic hepatic metastasis.   01/15/2020 Relapse/Recurrence   FINAL MICROSCOPIC DIAGNOSIS:   A. SOFT TISSUE, LEFT ABDOMINAL WALL, BIOPSY:  - Adenocarcinoma.  - See comment.   COMMENT:   The morphology is consistent with metastatic colorectal adenocarcinoma.    01/28/2020 -  Chemotherapy   First line FOLFIRI q2weeks starting 01/28/20 for 8 cycles.  -----Bevacizumab added with C2.  -----Changed to maintenance Xeloda 2000 mg twice daily for 2 weeks on/1 week off and bevacizumab 06/02/20. Starting with C3, dose reduce to 1500mg  BID due to skin toxicity.   05/05/2020 Imaging   IMPRESSION: 1. Improved appearance, with reduced size of the hepatic metastatic lesions and reduced size of the peritoneal tumor implants. 2. Other imaging findings of potential clinical significance: Notable prostatomegaly. Multilevel impingement in the lumbar spine. Dependent density in the gallbladder possibly from sludge or gallstones. Degenerative glenohumeral arthropathy bilaterally. 3. Aortic atherosclerosis.  08/15/2020 Imaging   CT CAP  IMPRESSION: Chest Impression:   No evidence of thoracic  metastasis   Abdomen / Pelvis Impression:   1. Hepatic metastasis are no longer measurable by CT imaging. 2. Peritoneal nodular metastasis in the LEFT abdomen are decreased in size. 3. No evidence of new peritoneal disease. 4. Thickening and LEFT rectus muscle at site of prior metastasis. No interval change. 5. No evidence of new or progressive colorectal carcinoma.     11/27/2020 Imaging   CT CAP  IMPRESSION: 1. There are multiple small pulmonary nodules in the right upper lobe that are new or enlarged compared to prior examination but measuring 4 mm or smaller, suspicious for pulmonary metastatic disease. 2. Unchanged peritoneal nodule adjacent to the tip of the spleen measuring 1.3 x 1.2 cm. Slightly peritoneal nodule adjacent to the splenic flexure measuring no greater than 6 mm, previously 8 mm. 3. No significant change in previously hypermetabolic soft tissue mass centered about the left lower quadrant colostomy site. 4. Previously established hypermetabolic hepatic metastatic disease remains inapparent by CT. 5. Status post sigmoid colon resection and reanastomosis. 6. Prostatomegaly with thickening of the decompressed urinary bladder, likely secondary to chronic outlet obstruction. 7. Cholelithiasis.   Aortic Atherosclerosis (ICD10-I70.0).   01/29/2021 Imaging   CT CAP   IMPRESSION: 1. Minimal interval progression of bilateral pulmonary nodules, concerning for metastatic disease. 2. No substantial change in size of the peritoneal nodule at the inferior tip of the spleen and splenic flexure nodule. 3. Nodular soft tissue measured at the ostomy site previously is not evident today. 4. Marked prostatomegaly. 5. Cholelithiasis. 6. Aortic Atherosclerosis (ICD10-I70.0).   04/06/2021 Imaging   EXAM: CT ABDOMEN AND PELVIS WITHOUT CONTRAST (Renal Stone Protocol)  IMPRESSION: 1. Bladder wall thickening with surrounding inflammation concerning for cystitis. 2. Partial  colectomy. Colon is air-filled with relative transition just proximal to the anastomosis. Stricture or recurrence at this level would be difficult to exclude. No focal mass identified. 3. There is a new subcutaneous fluid collection at the level of prior ostomy in the left abdominal wall. Infection not excluded. 4. There is new intramuscular hyperdensity at the level of the prior ostomy, indeterminate. Findings may related to scarring, tumor recurrence or metastatic disease is not excluded. 5. Stable nodularity in the left upper quadrant. 6. Cholelithiasis. 7. Trace right pleural effusion. 8. Stable prostatomegaly. 9.  Aortic Atherosclerosis (ICD10-I70.0).   05/05/2021 Pathology Results   FINAL MICROSCOPIC DIAGNOSIS:   A. ABDOMINAL WALL SCAR, EXCISION:  -  Adenocarcinoma  -  See comment   COMMENT:  Morphologically consistent with colonic adenocarcinoma (similar to  previously reported ([id number]).   06/02/2021 Imaging   EXAM: CT CHEST, ABDOMEN, AND PELVIS WITH CONTRAST  IMPRESSION: 1. Progressive multifocal pulmonary metastatic disease. 2. New low-density hepatic lesions, likely metastases. 3. Progressive peritoneal implant near the splenic flexure of the colon. Small peritoneal implant inferior to the spleen appears unchanged. 4. Increased soft tissue nodularity near the midline incision in the upper anterior abdominal wall, suspicious for tumor recurrence at the incision. This could reflect keloid formation. 5. Fluid collection in the left anterior abdominal wall attributed to recent abdominal surgery with associated small incisional hernia containing fat. 6. Additional incidental findings including prostatomegaly, cholelithiasis and Aortic Atherosclerosis (ICD10-I70.0).   06/11/2021 -  Chemotherapy   Patient is on Treatment Plan : COLORECTALXeloda + Bevacizumab q21d     07/29/2021 Imaging   EXAMINATION: CT ABDOMEN PELVIS WO CONTRAST  Impression  1. Left anterior  abdominal  wall postsurgical changes with no recurrent hernia.  2. Stable indeterminate 3 cm ventral abdominal wall mass located at the L1-2 level. Question scarring from prior surgery versus neoplasm.  3. Cholelithiasis.  4. Stable indeterminate bibasilar lung nodules. All of with a CT chest is recommended.  5. Indeterminate stable right hepatic lesions. Consider follow-up with an abdominal MRI with and without contrast for additional characterization.        INTERVAL HISTORY:  Dave Vaughn is here for a follow up of metastatic colon cancer. He was last seen by NP Lacie on 11/12/21. He was seen in the infusion area. He reports he is doing well overall. He notes he is wearing hernia belt that helps his pain a lot. "Aside from that, I could run a marathon!" He reports some mild neuropathy, denies functional deficits.   All other systems were reviewed with the patient and are negative.  MEDICAL HISTORY:  Past Medical History:  Diagnosis Date   Adenocarcinoma, colon (HCC) dx'd 01/2018   Anemia    taking iron supplements   Anxiety    Atrial fibrillation with RVR (HCC)    Colonic obstruction (HCC) 01/10/2018   Depression    Diverticulitis    Dysrhythmia    afib   History of kidney stones    Hypertension    Indwelling Foley catheter present    due to UTI    SURGICAL HISTORY: Past Surgical History:  Procedure Laterality Date   ANKLE SURGERY Left    when he was in college   COLON RESECTION N/A 01/11/2018   Procedure: LEFT COLON RESECTION, TAKEDOWN SPLENIC FLEXURE, COLOSTOMY;  Surgeon: Claud Kelp, MD;  Location: [facility];  Service: General;  Laterality: N/A;   COLONOSCOPY  01/11/2018   Procedure: COLONOSCOPY;  Surgeon: Lynann Bologna, MD;  Location: [facility];  Service: Endoscopy;;   COLONOSCOPY  05/10/2018   colonscopy  05/10/2018   EXCISION OF KELOID N/A 05/05/2021   Procedure: ABDOMINAL SCAR EXCISION;  Surgeon: Axel Filler, MD;  Location: [facility] OR;  Service: General;   Laterality: N/A;   FOREIGN BODY REMOVAL ABDOMINAL N/A 03/09/2021   Procedure: EXCISION OF FOREIGN BODY;  Surgeon: Axel Filler, MD;  Location: [facility] OR;  Service: General;  Laterality: N/A;   HERNIA REPAIR     HERNIA REPAIR  03/02/2019   EXPLORATORY LAPAROTOMY (N/A Abdomen)   INCISIONAL HERNIA REPAIR N/A 03/02/2019   Procedure: INCISIONAL HERNIA REPAIR , RECTORECTUS VS TAR HERNIA REPAIR;  Surgeon: Axel Filler, MD;  Location: [facility] OR;  Service: General;  Laterality: N/A;   INSERTION OF MESH N/A 03/02/2019   Procedure: Insertion Of Mesh;  Surgeon: Axel Filler, MD;  Location: [facility] OR;  Service: General;  Laterality: N/A;   LAPAROTOMY N/A 03/02/2019   Procedure: EXPLORATORY LAPAROTOMY;  Surgeon: Axel Filler, MD;  Location: [facility] OR;  Service: General;  Laterality: N/A;   LAPAROTOMY N/A 03/09/2021   Procedure: EXPLORATION OF ABDOMINAL WALL;  Surgeon: Axel Filler, MD;  Location: [facility] OR;  Service: General;  Laterality: N/A;   LYSIS OF ADHESION N/A 06/12/2018   Procedure: LYSIS OF ADHESIONS;  Surgeon: Karie Soda, MD;  Location: [facility];  Service: General;  Laterality: N/A;   LYSIS OF ADHESION N/A 03/02/2019   Procedure: Lysis Of Adhesion;  Surgeon: Axel Filler, MD;  Location: [facility] OR;  Service: General;  Laterality: N/A;   PORTACATH PLACEMENT Right 03/07/2018   Procedure: INSERTION PORT-A-CATH RIGHT SUBCLAVIAN;  Surgeon: Claud Kelp, MD;  Location: [facility] OR;  Service: General;  Laterality: Right;  PROCTOSCOPY N/A 06/12/2018   Procedure: RIGID PROCTOSCOPY;  Surgeon: Karie Soda, MD;  Location: [facility];  Service: General;  Laterality: N/A;   thumb surgery   2018   cyst removal    I have reviewed the social history and family history with the patient and they are unchanged from previous note.  ALLERGIES:  has No Known Allergies.  MEDICATIONS:  Current Outpatient Medications  Medication Sig Dispense Refill   apixaban (ELIQUIS) 5 MG TABS tablet Take 1 tablet (5 mg total) by mouth 2  (two) times daily. 60 tablet 6   b complex vitamins tablet Take 1 tablet by mouth daily.     capecitabine (XELODA) 500 MG tablet TAKE 3 TABLETS BY MOUTH EVERY 12 HOURS AFTER A MEAL FOR 14 DAYS  ON THEN 7 DAYS OFF 84 tablet 1   diltiazem (CARDIZEM CD) 360 MG 24 hr capsule TAKE 1 CAPSULE(360 MG) BY MOUTH DAILY 90 capsule 3   finasteride (PROSCAR) 5 MG tablet Take 5 mg by mouth daily.     levothyroxine (SYNTHROID) 75 MCG tablet TAKE 1 TABLET(75 MCG) BY MOUTH DAILY 90 tablet 1   lidocaine-prilocaine (EMLA) cream Apply 1 application topically as needed. 30 g 1   Multiple Vitamins-Iron (MULTIVITAMIN/IRON PO) Take 1 tablet by mouth daily.      tamsulosin (FLOMAX) 0.4 MG CAPS capsule Take 0.4 mg by mouth 2 (two) times daily.      No current facility-administered medications for this visit.   Facility-Administered Medications Ordered in Other Visits  Medication Dose Route Frequency Provider Last Rate Last Admin   sodium chloride flush (NS) 0.9 % injection 10 mL  10 mL Intracatheter PRN Malachy Mood, MD   10 mL at 11/26/21 1354    PHYSICAL EXAMINATION: ECOG PERFORMANCE STATUS: 1 - Symptomatic but completely ambulatory  There were no vitals filed for this visit. Wt Readings from Last 3 Encounters:  11/26/21 225 lb 8 oz (102.3 kg)  11/12/21 224 lb 12.8 oz (102 kg)  10/22/21 225 lb 8 oz (102.3 kg)     GENERAL:alert, no distress and comfortable SKIN: skin color normal, no rashes or significant lesions EYES: normal, Conjunctiva are pink and non-injected, sclera clear  NEURO: alert & oriented x 3 with fluent speech  LABORATORY DATA:  I have reviewed the data as listed    Latest Ref Rng & Units 11/26/2021   12:12 PM 11/12/2021   11:36 AM 10/22/2021    9:59 AM  CBC  WBC 4.0 - 10.5 K/uL 7.1  7.9  6.0   Hemoglobin 13.0 - 17.0 g/dL 16.1  09.6  04.5   Hematocrit 39.0 - 52.0 % 41.0  43.2  44.1   Platelets 150 - 400 K/uL 200  192  182         Latest Ref Rng & Units 11/26/2021   12:12 PM 11/12/2021    11:36 AM 10/22/2021    9:59 AM  CMP  Glucose 70 - 99 mg/dL 93  86  409   BUN 8 - 23 mg/dL 18  18  26    Creatinine 0.61 - 1.24 mg/dL 8.11  9.14  7.82   Sodium 135 - 145 mmol/L 140  138  139   Potassium 3.5 - 5.1 mmol/L 4.1  4.0  4.1   Chloride 98 - 111 mmol/L 106  102  105   CO2 22 - 32 mmol/L 30  31  30    Calcium 8.9 - 10.3 mg/dL 9.3  9.5  9.3   Total  Protein 6.5 - 8.1 g/dL 6.9  7.1  7.1   Total Bilirubin 0.3 - 1.2 mg/dL 0.7  1.2  1.0   Alkaline Phos 38 - 126 U/L 89  105  90   AST 15 - 41 U/L 19  19  25    ALT 0 - 44 U/L 13  16  22        RADIOGRAPHIC STUDIES: I have personally reviewed the radiological images as listed and agreed with the findings in the report. CT ABDOMEN W CONTRAST  Result Date: 11/26/2021 CLINICAL DATA:  Colorectal carcinoma metastasis. EXAM: CT ABDOMEN WITH CONTRAST TECHNIQUE: Multidetector CT imaging of the abdomen was performed using the standard protocol following bolus administration of intravenous contrast. RADIATION DOSE REDUCTION: This exam was performed according to the departmental dose-optimization program which includes automated exposure control, adjustment of the mA and/or kV according to patient size and/or use of iterative reconstruction technique. CONTRAST:  ISOVUE-300 IOPAMIDOL (ISOVUE-300) INJECTION 61% COMPARISON:  CT chest 09/16/2021, CT chest abdomen pelvis 06/02/2021 FINDINGS: Lower chest: Multiple round pulmonary nodules at the lung bases again demonstrated. Example nodule in the RIGHT lower lobe measures 10 mm (image 18/5) compared to 9 mm 7 mm nodule on image 31 RIGHT lower lobe measures 7 mm. LEFT lobe nodule measures 7 mm (image 9) compared to 8 mm Hepatobiliary: Comparison to noncontrast chest CT 09/16/2021, lesion in the medial RIGHT hepatic lobe measuring 27 mm (image 28/series 2 increased from 16 mm. Lesion in the anterior subcapsular LEFT hepatic lobe measures 22 mm (image 23) compared to 17 mm. Bilobed lesion along the gallbladder  fossa measuring 34 mm (image 30) increased from 30 mm. There is differing technique between contrast noncontrast imaging. No new lesions identified. Pancreas: Normal pancreatic parenchymal intensity. No ductal dilatation or inflammation. Spleen: Normal spleen. Adrenals/urinary tract: Adrenal glands and kidneys are normal. Stomach/Bowel: Stomach and limited of the small bowel is unremarkable Vascular/Lymphatic: Abdominal aortic normal caliber. No retroperitoneal periportal lymphadenopathy. Musculoskeletal: Midline lesion along the surgical scar is increased in size measuring 37 mm x 22 mm (image 37/2) compared to 31 mm x 23 mm (CT 09/16/2021). Additional enhancing nodularity anterior to the LEFT abdominal wall musculature at site of prior fluid collection measures 17 mm (49/2). Small nodular density medial to LEFT rectus muscle measuring 10 mm (51/2) is also concerning. IMPRESSION: 1. Stable lower lobe pulmonary nodule metastasis. 2. Mild interval increase in known hepatic metastasis. No new lesions identified. 3. Progression of nodularity along the ventral midline scar and new nodularity along the LEFT abdominal wall. 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 Electronically Signed   By: Genevive Bi M.D.   On: 11/26/2021 08:34      No orders of the defined types were placed in this encounter.  All questions were answered. The patient knows to call the clinic with any problems, questions or concerns. No barriers to learning was detected. The total time spent in the appointment was 40 minutes.     Malachy Mood, MD 11/26/2021   I, Mickie Bail, am acting as scribe for Malachy Mood, MD.   I have reviewed the above documentation for accuracy and completeness, and I agree with the above.

## 2021-11-26 NOTE — Progress Notes (Addendum)
ON PATHWAY REGIMEN - Colorectal  No Change  Continue With Treatment as Ordered.  Original Decision Date/Time: 05/10/2021 12:45     A cycle is every 21 days:     Capecitabine      Bevacizumab-xxxx      Oxaliplatin   **Always confirm dose/schedule in your pharmacy ordering system**  Patient Characteristics: Distant Metastases, Nonsurgical Candidate, KRAS/NRAS Mutation Positive/Unknown (BRAF V600 Wild-Type/Unknown), Standard Cytotoxic Therapy, Second Line Standard Cytotoxic Therapy, Bevacizumab Eligible Tumor Location: Colon Therapeutic Status: Distant Metastases Microsatellite/Mismatch Repair Status: MSS/pMMR BRAF Mutation Status: Wild-Type (no mutation) KRAS/NRAS Mutation Status: Mutation Positive Standard Cytotoxic Line of Therapy: Second Line Standard Cytotoxic Therapy Bevacizumab Eligibility: Eligible Intent of Therapy: Non-Curative / Palliative Intent, Discussed with Patient

## 2021-11-26 NOTE — Progress Notes (Signed)
DISCONTINUE ON PATHWAY REGIMEN - Colorectal     A cycle is every 21 days:     Capecitabine      Bevacizumab-xxxx      Oxaliplatin   **Always confirm dose/schedule in your pharmacy ordering system**  REASON: Disease Progression PRIOR TREATMENT: [id number]: CapeOx + Bevacizumab q21 Days TREATMENT RESPONSE: Partial Response (PR)  START ON PATHWAY REGIMEN - Colorectal     A cycle is every 21 days:     Capecitabine      Bevacizumab-xxxx      Oxaliplatin   **Always confirm dose/schedule in your pharmacy ordering system**  Patient Characteristics: Distant Metastases, Nonsurgical Candidate, KRAS/NRAS Mutation Positive/Unknown (BRAF V600 Wild-Type/Unknown), Standard Cytotoxic Therapy, First Line Standard Cytotoxic Therapy, Bevacizumab Eligible, PS = 0,1 Tumor Location: Colon Therapeutic Status: Distant Metastases Microsatellite/Mismatch Repair Status: MSS/pMMR BRAF Mutation Status: Wild-Type (no mutation) KRAS/NRAS Mutation Status: Mutation Positive Standard Cytotoxic Line of Therapy: First Line Standard Cytotoxic Therapy ECOG Performance Status: 0 Bevacizumab Eligibility: Eligible Intent of Therapy: Non-Curative / Palliative Intent, Discussed with Patient

## 2021-11-26 NOTE — Patient Instructions (Signed)
[facility] MEDICAL ONCOLOGY  Discharge Instructions: Thank you for choosing [facility] to provide your oncology and hematology care.   If you have a lab appointment with the Cancer Center, please go directly to the Cancer Center and check in at the registration area.   Wear comfortable clothing and clothing appropriate for easy access to any Portacath or PICC line.   We strive to give you quality time with your provider. You may need to reschedule your appointment if you arrive late (15 or more minutes).  Arriving late affects you and other patients whose appointments are after yours.  Also, if you miss three or more appointments without notifying the office, you may be dismissed from the clinic at the provider's discretion.      For prescription refill requests, have your pharmacy contact our office and allow 72 hours for refills to be completed.    Today you received the following chemotherapy and/or immunotherapy agents: Zirabev     To help prevent nausea and vomiting after your treatment, we encourage you to take your nausea medication as directed.  BELOW ARE SYMPTOMS THAT SHOULD BE REPORTED IMMEDIATELY: *FEVER GREATER THAN 100.4 F (38 C) OR HIGHER *CHILLS OR SWEATING *NAUSEA AND VOMITING THAT IS NOT CONTROLLED WITH YOUR NAUSEA MEDICATION *UNUSUAL SHORTNESS OF BREATH *UNUSUAL BRUISING OR BLEEDING *URINARY PROBLEMS (pain or burning when urinating, or frequent urination) *BOWEL PROBLEMS (unusual diarrhea, constipation, pain near the anus) TENDERNESS IN MOUTH AND THROAT WITH OR WITHOUT PRESENCE OF ULCERS (sore throat, sores in mouth, or a toothache) UNUSUAL RASH, SWELLING OR PAIN  UNUSUAL VAGINAL DISCHARGE OR ITCHING   Items with * indicate a potential emergency and should be followed up as soon as possible or go to the Emergency Department if any problems should occur.  Please show the CHEMOTHERAPY ALERT CARD or IMMUNOTHERAPY ALERT CARD at check-in to the  Emergency Department and triage nurse.  Should you have questions after your visit or need to cancel or reschedule your appointment, please contact [facility] MEDICAL [facility]COL[facility]  Dept: [phone]  and follow the prompts.  Office hours are 8:00 a.m. to 4:30 p.m. Monday - Friday. Please note that voicemails left after 4:00 p.m. may not be returned until the following business day.  We are closed weekends and major holidays. You have access to a nurse at all times for urgent questions. Please call the main number to the clinic Dept: [phone] and follow the prompts.   For any non-urgent questions, you may also contact your provider using MyChart. We now offer e-Visits for anyone 18 and older to request care online for non-urgent symptoms. For details visit [email or web address].   Also download the MyChart app! Go to the app store, search "MyChart", open the app, select [facility], and log in with your MyChart username and password.  Masks are optional in the cancer centers. If you would like for your care team to wear a mask while they are taking care of you, please let them know. For doctor visits, patients may have with them one support person who is at least 75 years old. At this time, visitors are not allowed in the infusion area. 

## 2021-12-02 ENCOUNTER — Other Ambulatory Visit: Payer: Self-pay

## 2021-12-02 NOTE — Progress Notes (Signed)
The proposed treatment discussed in conference is for discussion purpose only and is not a binding recommendation.  The patients have not been physically examined, or presented with their treatment options.  Therefore, final treatment plans cannot be decided.  

## 2021-12-04 ENCOUNTER — Ambulatory Visit: Payer: Commercial Managed Care - PPO

## 2021-12-04 ENCOUNTER — Other Ambulatory Visit: Payer: Commercial Managed Care - PPO

## 2021-12-04 ENCOUNTER — Ambulatory Visit: Payer: Commercial Managed Care - PPO | Admitting: Hematology

## 2021-12-09 NOTE — Progress Notes (Signed)
Pharmacist Chemotherapy Monitoring - Initial Assessment    Anticipated start date: 12/17/21   The following has been reviewed per standard work regarding the patient's treatment regimen: The patient's diagnosis, treatment plan and drug doses, and organ/hematologic function Lab orders and baseline tests specific to treatment regimen  The treatment plan start date, drug sequencing, and pre-medications Prior authorization status  Patient's documented medication list, including drug-drug interaction screen and prescriptions for anti-emetics and supportive care specific to the treatment regimen The drug concentrations, fluid compatibility, administration routes, and timing of the medications to be used The patient's access for treatment and lifetime cumulative dose history, if applicable  The patient's medication allergies and previous infusion related reactions, if applicable   Changes made to treatment plan:  N/A  Follow up needed:  N/A   Larene Beach, RPH, 12/09/2021  1:43 PM

## 2021-12-10 ENCOUNTER — Telehealth: Payer: Self-pay | Admitting: Surgery

## 2021-12-10 ENCOUNTER — Encounter: Payer: Self-pay | Admitting: Hematology

## 2021-12-10 NOTE — Telephone Encounter (Signed)
I called the pt back to let him know I sent his concerns to Dr. Burr Medico, and she is going to call the pt later this afternoon.  Dr. Burr Medico stated that she called the pt last week and left a message but the pt did not call back.    I told the pt that Dr. Burr Medico sees patients until around 3pm, so she will call sometime after that, maybe around 5pm.  He verbalized understanding to have his phone on and be looking for her call.

## 2021-12-10 NOTE — Telephone Encounter (Signed)
The pt called in stating he was very upset and concerned about the continued pain he is having on his left side below his rib cage.  He states that the pain has been ongoing for 2 years, and that Dr. Rosendo Gros recently prescribed Tramadol 50 mg prn.  However, the pt just got the medication and has not taken any yet.  Dr. Rosendo Gros is on vacation for a month, and the pt wants to know what the plan is for the pain, instead of having to take pain medication all the time.  He wants to know if he can be referred to another surgeon or what Dr. Lewayne Bunting recommendation is.  I notified Dr. Burr Medico and am awaiting orders.

## 2021-12-11 ENCOUNTER — Other Ambulatory Visit: Payer: Self-pay | Admitting: Hematology

## 2021-12-11 ENCOUNTER — Other Ambulatory Visit (HOSPITAL_COMMUNITY): Payer: Self-pay

## 2021-12-11 ENCOUNTER — Telehealth: Payer: Self-pay | Admitting: Pharmacy Technician

## 2021-12-11 ENCOUNTER — Encounter: Payer: Self-pay | Admitting: Hematology

## 2021-12-11 MED ORDER — ACETAMINOPHEN-CODEINE 300-30 MG PO TABS: 1.0000 | ORAL_TABLET | Freq: Three times a day (TID) | ORAL | 0 refills | Status: DC | PRN

## 2021-12-11 NOTE — Telephone Encounter (Signed)
Oral Oncology Patient Advocate Encounter   Received notification that prior authorization for Capecitabine (Xeloda) is required.   PA submitted on 12/11/2021 Key [id number] Status is pending     Lady Deutscher, CPhT-Adv Pharmacy Patient Advocate Specialist [facility] Patient Advocate Team Direct Number: [phone]  Fax: [phone]

## 2021-12-11 NOTE — Telephone Encounter (Signed)
Oral Oncology Patient Advocate Encounter  Prior Authorization for Capecitabine (Xeloda) has been approved.    PA# [id number] Effective dates: 12/11/2021 through 12/12/2022  Patients co-pay is $15.    Lady Deutscher, CPhT-Adv Pharmacy Patient [facility] Patient Advocate Team Direct Number: [phone]  Fax: [phone]

## 2021-12-16 ENCOUNTER — Other Ambulatory Visit: Payer: Self-pay

## 2021-12-16 ENCOUNTER — Other Ambulatory Visit: Payer: Self-pay | Admitting: Hematology

## 2021-12-16 DIAGNOSIS — C186 Malignant neoplasm of descending colon: Secondary | ICD-10-CM

## 2021-12-16 MED FILL — Dexamethasone Sodium Phosphate Inj 100 MG/10ML: INTRAMUSCULAR | Qty: 1 | Status: AC

## 2021-12-17 ENCOUNTER — Inpatient Hospital Stay: Payer: BC Managed Care – PPO

## 2021-12-17 ENCOUNTER — Other Ambulatory Visit: Payer: Self-pay

## 2021-12-17 ENCOUNTER — Encounter: Payer: Self-pay | Admitting: Hematology

## 2021-12-17 ENCOUNTER — Inpatient Hospital Stay: Payer: BC Managed Care – PPO | Attending: Nurse Practitioner

## 2021-12-17 ENCOUNTER — Inpatient Hospital Stay: Payer: BC Managed Care – PPO | Admitting: Hematology

## 2021-12-17 ENCOUNTER — Telehealth: Payer: Self-pay

## 2021-12-17 VITALS — BP 145/99 | HR 70 | Resp 17

## 2021-12-17 VITALS — BP 141/85 | HR 73 | Temp 98.4°F | Resp 18 | Ht 70.0 in | Wt 221.4 lb

## 2021-12-17 DIAGNOSIS — Z7901 Long term (current) use of anticoagulants: Secondary | ICD-10-CM | POA: Diagnosis not present

## 2021-12-17 DIAGNOSIS — Z5111 Encounter for antineoplastic chemotherapy: Secondary | ICD-10-CM | POA: Diagnosis not present

## 2021-12-17 DIAGNOSIS — N183 Chronic kidney disease, stage 3 unspecified: Secondary | ICD-10-CM | POA: Insufficient documentation

## 2021-12-17 DIAGNOSIS — K460 Unspecified abdominal hernia with obstruction, without gangrene: Secondary | ICD-10-CM | POA: Diagnosis not present

## 2021-12-17 DIAGNOSIS — C787 Secondary malignant neoplasm of liver and intrahepatic bile duct: Secondary | ICD-10-CM | POA: Insufficient documentation

## 2021-12-17 DIAGNOSIS — I4891 Unspecified atrial fibrillation: Secondary | ICD-10-CM | POA: Insufficient documentation

## 2021-12-17 DIAGNOSIS — Z79890 Hormone replacement therapy (postmenopausal): Secondary | ICD-10-CM | POA: Diagnosis not present

## 2021-12-17 DIAGNOSIS — C186 Malignant neoplasm of descending colon: Secondary | ICD-10-CM

## 2021-12-17 DIAGNOSIS — Z95828 Presence of other vascular implants and grafts: Secondary | ICD-10-CM

## 2021-12-17 DIAGNOSIS — C786 Secondary malignant neoplasm of retroperitoneum and peritoneum: Secondary | ICD-10-CM | POA: Diagnosis not present

## 2021-12-17 DIAGNOSIS — I129 Hypertensive chronic kidney disease with stage 1 through stage 4 chronic kidney disease, or unspecified chronic kidney disease: Secondary | ICD-10-CM | POA: Insufficient documentation

## 2021-12-17 DIAGNOSIS — E039 Hypothyroidism, unspecified: Secondary | ICD-10-CM | POA: Insufficient documentation

## 2021-12-17 DIAGNOSIS — N400 Enlarged prostate without lower urinary tract symptoms: Secondary | ICD-10-CM | POA: Insufficient documentation

## 2021-12-17 LAB — CMP (CANCER CENTER ONLY)
ALT: 16 U/L (ref 0–44)
AST: 21 U/L (ref 15–41)
Albumin: 4.3 g/dL (ref 3.5–5.0)
Alkaline Phosphatase: 103 U/L (ref 38–126)
Anion gap: 5 (ref 5–15)
BUN: 25 mg/dL — ABNORMAL HIGH (ref 8–23)
CO2: 31 mmol/L (ref 22–32)
Calcium: 9.5 mg/dL (ref 8.9–10.3)
Chloride: 102 mmol/L (ref 98–111)
Creatinine: 1.19 mg/dL (ref 0.61–1.24)
GFR, Estimated: >60 mL/min (ref >60)
Glucose, Bld: 114 mg/dL — ABNORMAL HIGH (ref 70–99)
Potassium: 4.2 mmol/L (ref 3.5–5.1)
Sodium: 138 mmol/L (ref 135–145)
Total Bilirubin: 0.9 mg/dL (ref 0.3–1.2)
Total Protein: 7.3 g/dL (ref 6.5–8.1)

## 2021-12-17 LAB — CBC WITH DIFFERENTIAL (CANCER CENTER ONLY)
Abs Immature Granulocytes: 0.01 10*3/uL (ref 0.00–0.07)
Basophils Absolute: 0 10*3/uL (ref 0.0–0.1)
Basophils Relative: 1 %
Eosinophils Absolute: 0.1 10*3/uL (ref 0.0–0.5)
Eosinophils Relative: 2 %
HCT: 44.4 % (ref 39.0–52.0)
Hemoglobin: 15.3 g/dL (ref 13.0–17.0)
Immature Granulocytes: 0 %
Lymphocytes Relative: 31 %
Lymphs Abs: 2 10*3/uL (ref 0.7–4.0)
MCH: 33.1 pg (ref 26.0–34.0)
MCHC: 34.5 g/dL (ref 30.0–36.0)
MCV: 96.1 fL (ref 80.0–100.0)
Monocytes Absolute: 0.6 10*3/uL (ref 0.1–1.0)
Monocytes Relative: 9 %
Neutro Abs: 3.7 10*3/uL (ref 1.7–7.7)
Neutrophils Relative %: 57 %
Platelet Count: 172 10*3/uL (ref 150–400)
RBC: 4.62 MIL/uL (ref 4.22–5.81)
RDW: 13.7 % (ref 11.5–15.5)
WBC Count: 6.5 10*3/uL (ref 4.0–10.5)
nRBC: 0 % (ref 0.0–0.2)

## 2021-12-17 LAB — TOTAL PROTEIN, URINE DIPSTICK: Protein, ur: 300 mg/dL — AB

## 2021-12-17 LAB — CEA (IN HOUSE-CHCC): CEA (CHCC-In House): 32.44 ng/mL — ABNORMAL HIGH (ref 0.00–5.00)

## 2021-12-17 MED ORDER — ONDANSETRON HCL 8 MG PO TABS: 8.0000 mg | ORAL_TABLET | Freq: Two times a day (BID) | ORAL | 1 refills | Status: DC | PRN

## 2021-12-17 MED ORDER — SODIUM CHLORIDE 0.9% FLUSH
10.0000 mL | INTRAVENOUS | Status: DC | PRN
  Administered 2021-12-17: 10 mL

## 2021-12-17 MED ORDER — PROCHLORPERAZINE MALEATE 10 MG PO TABS: 10.0000 mg | ORAL_TABLET | Freq: Four times a day (QID) | ORAL | 1 refills | Status: DC | PRN

## 2021-12-17 MED ORDER — DEXTROSE 5 % IV SOLN: Freq: Once | INTRAVENOUS | Status: AC

## 2021-12-17 MED ORDER — PALONOSETRON HCL INJECTION 0.25 MG/5ML
0.2500 mg | Freq: Once | INTRAVENOUS | Status: AC
  Administered 2021-12-17: 0.25 mg via INTRAVENOUS
  Filled 2021-12-17: qty 5

## 2021-12-17 MED ORDER — HEPARIN SOD (PORK) LOCK FLUSH 100 UNIT/ML IV SOLN
500.0000 [IU] | Freq: Once | INTRAVENOUS | Status: AC | PRN
  Administered 2021-12-17: 500 [IU]

## 2021-12-17 MED ORDER — SODIUM CHLORIDE 0.9 % IV SOLN: 7.5000 mg/kg | Freq: Once | INTRAVENOUS | Status: DC

## 2021-12-17 MED ORDER — SODIUM CHLORIDE 0.9 % IV SOLN
10.0000 mg | Freq: Once | INTRAVENOUS | Status: AC
  Administered 2021-12-17: 10 mg via INTRAVENOUS
  Filled 2021-12-17: qty 10

## 2021-12-17 MED ORDER — SODIUM CHLORIDE 0.9 % IV SOLN: Freq: Once | INTRAVENOUS | Status: AC

## 2021-12-17 MED ORDER — LORAZEPAM 0.5 MG PO TABS: 0.5000 mg | ORAL_TABLET | Freq: Four times a day (QID) | ORAL | 0 refills | Status: DC | PRN

## 2021-12-17 MED ORDER — LIDOCAINE-PRILOCAINE 2.5-2.5 % EX CREA: TOPICAL_CREAM | CUTANEOUS | 3 refills | Status: DC

## 2021-12-17 MED ORDER — OXALIPLATIN CHEMO INJECTION 100 MG/20ML
60.0000 mg/m2 | Freq: Once | INTRAVENOUS | Status: AC
  Administered 2021-12-17: 135 mg via INTRAVENOUS
  Filled 2021-12-17: qty 20

## 2021-12-17 MED ORDER — DEXAMETHASONE 4 MG PO TABS: 8.0000 mg | ORAL_TABLET | Freq: Every day | ORAL | 1 refills | Status: DC

## 2021-12-17 NOTE — Telephone Encounter (Signed)
Per Dr. Burr Medico, hold Avastin on 12/17/2021 d/t elevated urine protein.  Proceed with Oxaliplatin today 12/17/2021.

## 2021-12-17 NOTE — Progress Notes (Signed)
[place]   [phone] [phone]   Clinic Follow up Note   Patient Care Team: [name], MD (Inactive) as PCP - General (Family Medicine) [name], MD as Consulting Physician (Cardiology) [name], MD as Consulting Physician (Gastroenterology) [name], MD as Consulting Physician (General Surgery) [name], MD as Consulting Physician (General Surgery) [name], MD as Consulting Physician (Urology) [name], MD as Consulting Physician (Medical Oncology)  Date of Service:  12/17/2021  CHIEF COMPLAINT: f/u of metastatic colon cancer  CURRENT THERAPY:  -Bevacizumab/Xeloda restarted 08/12/21             -Xeloda dose: $RemoveBefor'1500mg'trGwsPNnJlbw$  BID, days 1-14 q21days -Oxaliplatin restarted 12/17/21  ASSESSMENT & PLAN:  Dave Vaughn is a 76 y.o. male with   1. Cancer of left colon, adenocarcinoma, stage IIIB (pT3N1cM0), MSI-stable, KRAS Q61H mutation (+), liver and peritoneal metastasis in 12/2019  -Diagnosed in 01/2018. S/p surgery and adjuvant FOLFOX. He did not tolerate well and declined additional treatment at that time. -restaging in 12/2019 revealed local recurrence in left abdominal wall and peritoneal metastasis, 2 hypermetabolic liver mets. Abdominal wall biopsy confirmed metastatic colorectal adenocarcinoma in 01/2020  -FO showed KRAS mutation (+), he is not a candidate for EGFR inhibitor.   -received 8 cycles FOLFIRI 8/23-12/8/21, Bevacizumab added with cycle 2. Tolerated well with fatigue, nausea, diarrhea. CEA normalized after cycle 4 -switched to maintenance xeloda and beva 06/02/20. Beva was held 01/13/21-06/10/21 due to high proteinuria -abdominal scar excision on 05/05/21 under Dr. Rosendo Gros. Pathology revealed adenocarcinoma.  -CT CAP on 06/02/21 showed progressive disease in lung, liver, colon, and abdominal wall. -He opted to restart CAPOX. He restarted oxaliplatin and bevacizumab on 06/11/21. He tolerated  poorly with a number of side effects, thus oxali was discontinued and beva was held. He was able to recover. -he had CT AP on 07/29/21 in [place] (in [place]) showing: no recurrent hernia, stable indeterminate 3 cm ventral abdominal wall mass, bibasilar lung nodules, and right hepatic lesions.  -he restarted bevacizumab on 08/12/21 and Xeloda on 08/13/21. He tolerated well -restaging CT abdomen 11/25/21, showed increase in hepatic metastasis and progression of nodularity along left abdominal wall. -his CEA has also been rising, most recently up to 36.99 on 11/12/21, consistent with cancer progression  -he is scheduled to restart oxaliplatin today, 12/17/21. Given previous poor tolerance, he will restart at lower dose. He has declined 5-fu pump infusion  -His main concern is abdominal pain at the surgical incision when he stands or walks, which has significantly limited his activity level.  We have reviewed his case in GI conference, I think his pain is related to the cancer deposits in the abdominal wall at the previous incision, and recommend palliative radiation.  I have made urgent referral, he will be seen early next week by radiation oncologist Dr. Lisbeth Renshaw -We will postpone his next cycle of CapeOx to 4 weeks due to radiation.    2. Abdominal hernia with bowel obstruction -he underwent emergent hernia repair as well as small bowel resection on 06/16/21 while in [place]. Pathology was negative for malignancy (results available in Care Everywhere).  3. Comorbidities: Hypothyroidism, BPH, A.fib, CKD stage III -on synthroid and Eliquis -continue f/u with other doctors   4. Left side abdominal pain -Occurs when he stands or walks for 5 to 10 minutes -Likely secondary to chemo deposits in the previous surgical site -He has a palpable hard nodules around the incision -I have made urgent  referral to radiation oncology for palliative radiation. -I have called in Tylenol 3.  He is reluctant to take any narcotics,  I explains to him that he probably would need it until radiation kicks in    PLAN: -proceed with Oxaliplatin/bevacizumab today (hold Due to high proteinuria), will give low-dose oxaliplatin due to his pending radiation. -continue Xeloda at same dose 1500 mg twice daily for 14 days, he started on July 10 -lab, flush, f/u, and oxali/beva in 4 weeks (postpone due to radiation and his coming trip in August) -He will see radiation oncology Dr. Lisbeth Renshaw early next week, likely proceed with radiation in the next week or 2.  He will call me when he knows he is scheduled for radiation.   No problem-specific Assessment & Plan notes found for this encounter.   SUMMARY OF ONCOLOGIC HISTORY: Oncology History Overview Note   Cancer Staging  Cancer of left colon [facility]) Staging form: Colon and Rectum, AJCC 8th Edition - Pathologic stage from 01/11/2018: Stage IIIB (pT3, pN1c, cM0) - Signed by [name], MD on 01/16/2018 Total positive nodes: 0 Histologic grading system: 4 grade system Histologic grade (G): G2     Cancer of left colon ([place])  01/10/2018 Imaging   CT AP W Contrast 01/10/18  IMPRESSION: Irregular soft tissue density causing stricture of the mid descending colon likely the site of obstruction for the dilated small bowel. 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 No evidence of perforation.   Equivocal findings involving the appendix measuring 1.2 cm at the appendiceal tip with mucosal enhancement. No adjacent free fluid or inflammatory change. Findings are nonspecific, but can be seen with early acute appendicitis.   Mild prostatic enlargement. Increased density over the posterior bladder base likely due to the large prostatic impression although cannot completely exclude a bladder mass. Urology protocol CT or ultrasound may be helpful for better evaluation.   Mild cholelithiasis.   Stable 1.5 cm cystic structure over the lower pole right kidney likely slightly hyperdense cyst.    Diverticulosis of the colon.   Aortic Atherosclerosis (ICD10-I70.0).   01/11/2018 Cancer Staging   Staging form: Colon and Rectum, AJCC 8th Edition - Pathologic stage from 01/11/2018: Stage IIIB (pT3, pN1c, cM0) - Signed by [name], MD on 01/16/2018   01/11/2018 Surgery   LEFT COLON RESECTION, TAKEDOWN SPLENIC FLEXURE, COLOSTOMY by Dr. [name]    01/11/2018 Procedure   Colonoscopy 01/11/18 by Dr. [name]  - Malignant completely obstructing tumor in the mid descending colon. Tattooed. - Diverticulosis in the sigmoid colon. - Non-bleeding internal hemorrhoids. - No specimens collected.   01/11/2018 Pathology Results   Diagnosis 01/11/18  1. Colon, segmental resection for tumor, descending colon - INVASIVE COLORECTAL ADENOCARCINOMA, 4 CM. - TUMOR EXTENDS INTO PERICOLONIC CONNECTIVE TISSUE. - TUMOR FOCALLY INVOLVES RADIAL MARGIN. - ONE MESENTERIC TUMOR DEPOSIT. - THIRTEEN BENIGN LYMPH NODES (0/13). 2. Colon, segmental resection, splenic flexure - BENIGN COLON. - NO EVIDENCE OF MALIGNANCY .   01/11/2018 Tumor Marker   Baseline CEA at 3.4   01/16/2018 Initial Diagnosis   Cancer of left colon ([place])   01/23/2018 Imaging   CT CHEST WO CONTRAST IMPRESSION: 1. No evidence for metastatic disease within the chest. 2. Small left pleural effusion with underlying opacities which may represent atelectasis. Right basilar atelectasis. 3. Few foci of gas within the upper abdomen in the omentum with surrounding fat stranding, likely postsurgical 4. Aortic Atherosclerosis (ICD10-I70.0).   03/08/2018 - 05/15/2018 Chemotherapy   adjuvant FOLOFX. Due to side effects of neuropahty  Oxaliplatin was stopped after 3 cycles and chemo was stopped after 6 cycles. He declined completing 6 months of chemo treatment.     03/19/2018 Imaging   03/19/2018 CT AP IMPRESSION: 1. Interval partial left hemicolectomy and descending colostomy. 2. Heterogeneous soft tissue density along the left anterior renal fascia is most  likely postoperative (favor fat necrosis). No well-defined fluid collection. 3. Mild left lower quadrant edema, new since 01/10/2018. This could be postoperative. Superimposed sigmoid diverticulitis and/or cystitis cannot be excluded. 4. Subtle hyperenhancing nodule within the anterior bladder dome cannot be excluded. Consider nonemergent cystoscopy. When this is performed, recommend attention to the left ureterovesicular junction and distal left ureter to evaluate questionable soft tissue fullness. 5. Cholelithiasis. 6.  Aortic Atherosclerosis (ICD10-I70.0). 7. Prostatomegaly.   11/13/2018 Imaging   CT CAP WO Contrast 11/13/18  IMPRESSION: 1. Reversal of left lower quadrant colostomy with sigmoid colon anastomosis. No complicating features. No findings for residual or recurrent tumor or metastatic disease involving the chest, abdomen or pelvis without contrast. 2. No acute abdominal/pelvic findings. 3. Gallbladder sludge and gallstones but no findings for acute cholecystitis. 4. Stable anterior abdominal wall hernia. 5. The right testicle is in the right inguinal canal.   10/03/2019 Imaging   CT CAP WO contrast  IMPRESSION: 1. New rounded density interposed between the prostate gland and anterior upper rectal wall could represent adenopathy or local extension of anterior rectal tumor. 2. Marked prostatomegaly, prostate volume 150 cubic cm. 3. Other imaging findings of potential clinical significance: Aortic Atherosclerosis (ICD10-I70.0). Coronary atherosclerosis. Trace right pleural effusion. Suspected cholelithiasis. Nonobstructive left nephrolithiasis. Multilevel lumbar impingement. Bilateral mildly retracted testicles. Hypodense exophytic lesion of the right kidney, most likely to be a cyst.   11/02/2019 Procedure   colonoscopy on 11/02/2019 by Dr. [name] Plan showed normal digital rectal exam, 3 polyps in the rectum, descending colon and cecum, and a prior sigmoid: Anastomosis  characterized by erythema.  He found an extrinsic nonobstructing medium-sized mass in the proximal rectum about 4 cm in length, no internal rectal mass.   Diagnosis Surgical [P], colon, cecum, descending, rectal, polyp (3) - TUBULAR ADENOMA (TWO) - NO HIGH GRADE DYSPLASIA OR CARCINOMA. - COLONIC FRAGMENT WITH BENIGN LYMPHOID AGGREGATE.   12/07/2019 Imaging   MRI pelvis IMPRESSION: 1. Masslike area in the rectum suspicious for rectal neoplasm, likely T4b based on the appearance of soft tissue extending along the anterior peritoneal reflection and into the seminal vesicles. Correlation with recent colonoscopy results may be helpful. Area of anastomosis and other areas of the pelvis are not imaged on today's exam.   12/21/2019 PET scan   IMPRESSION: 1. Unfortunately evidence for peritoneal metastasis. Intensely hypermetabolic nodules along the LEFT pericolic gutter. Favor hypermetabolic mass anterior to the rectum to represent serosal implant along the ventral surface of the rectum. 2. local recurrence within the LEFT abdominal wall at site prior colostomy. Intense hypermetabolic thickening of the rectus muscle at this site. 3. Two hypermetabolic hepatic metastasis.   01/15/2020 Relapse/Recurrence   FINAL MICROSCOPIC DIAGNOSIS:   A. SOFT TISSUE, LEFT ABDOMINAL WALL, BIOPSY:  - Adenocarcinoma.  - See comment.   COMMENT:   The morphology is consistent with metastatic colorectal adenocarcinoma.    01/28/2020 -  Chemotherapy   First line FOLFIRI q2weeks starting 01/28/20 for 8 cycles.  -----Bevacizumab added with C2.  -----Changed to maintenance Xeloda 2000 mg twice daily for 2 weeks on/1 week off and bevacizumab 06/02/20. Starting with C3, dose reduce to $RemoveB'1500mg'oOUzJQlH$  BID due to skin toxicity.  05/05/2020 Imaging   IMPRESSION: 1. Improved appearance, with reduced size of the hepatic metastatic lesions and reduced size of the peritoneal tumor implants. 2. Other imaging findings of  potential clinical significance: Notable prostatomegaly. Multilevel impingement in the lumbar spine. Dependent density in the gallbladder possibly from sludge or gallstones. Degenerative glenohumeral arthropathy bilaterally. 3. Aortic atherosclerosis.   08/15/2020 Imaging   CT CAP  IMPRESSION: Chest Impression:   No evidence of thoracic metastasis   Abdomen / Pelvis Impression:   1. Hepatic metastasis are no longer measurable by CT imaging. 2. Peritoneal nodular metastasis in the LEFT abdomen are decreased in size. 3. No evidence of new peritoneal disease. 4. Thickening and LEFT rectus muscle at site of prior metastasis. No interval change. 5. No evidence of new or progressive colorectal carcinoma.     11/27/2020 Imaging   CT CAP  IMPRESSION: 1. There are multiple small pulmonary nodules in the right upper lobe that are new or enlarged compared to prior examination but measuring 4 mm or smaller, suspicious for pulmonary metastatic disease. 2. Unchanged peritoneal nodule adjacent to the tip of the spleen measuring 1.3 x 1.2 cm. Slightly peritoneal nodule adjacent to the splenic flexure measuring no greater than 6 mm, previously 8 mm. 3. No significant change in previously hypermetabolic soft tissue mass centered about the left lower quadrant colostomy site. 4. Previously established hypermetabolic hepatic metastatic disease remains inapparent by CT. 5. Status post sigmoid colon resection and reanastomosis. 6. Prostatomegaly with thickening of the decompressed urinary bladder, likely secondary to chronic outlet obstruction. 7. Cholelithiasis.   Aortic Atherosclerosis (ICD10-I70.0).   01/29/2021 Imaging   CT CAP   IMPRESSION: 1. Minimal interval progression of bilateral pulmonary nodules, concerning for metastatic disease. 2. No substantial change in size of the peritoneal nodule at the inferior tip of the spleen and splenic flexure nodule. 3. Nodular soft tissue  measured at the ostomy site previously is not evident today. 4. Marked prostatomegaly. 5. Cholelithiasis. 6. Aortic Atherosclerosis (ICD10-I70.0).   04/06/2021 Imaging   EXAM: CT ABDOMEN AND PELVIS WITHOUT CONTRAST (Renal Stone Protocol)  IMPRESSION: 1. Bladder wall thickening with surrounding inflammation concerning for cystitis. 2. Partial colectomy. Colon is air-filled with relative transition just proximal to the anastomosis. Stricture or recurrence at this level would be difficult to exclude. No focal mass identified. 3. There is a new subcutaneous fluid collection at the level of prior ostomy in the left abdominal wall. Infection not excluded. 4. There is new intramuscular hyperdensity at the level of the prior ostomy, indeterminate. Findings may related to scarring, tumor recurrence or metastatic disease is not excluded. 5. Stable nodularity in the left upper quadrant. 6. Cholelithiasis. 7. Trace right pleural effusion. 8. Stable prostatomegaly. 9.  Aortic Atherosclerosis (ICD10-I70.0).   05/05/2021 Pathology Results   FINAL MICROSCOPIC DIAGNOSIS:   A. ABDOMINAL WALL SCAR, EXCISION:  -  Adenocarcinoma  -  See comment   COMMENT:  Morphologically consistent with colonic adenocarcinoma (similar to  previously reported ([id number]).   06/02/2021 Imaging   EXAM: CT CHEST, ABDOMEN, AND PELVIS WITH CONTRAST  IMPRESSION: 1. Progressive multifocal pulmonary metastatic disease. 2. New low-density hepatic lesions, likely metastases. 3. Progressive peritoneal implant near the splenic flexure of the colon. Small peritoneal implant inferior to the spleen appears unchanged. 4. Increased soft tissue nodularity near the midline incision in the upper anterior abdominal wall, suspicious for tumor recurrence at the incision. This could reflect keloid formation. 5. Fluid collection in the left anterior abdominal wall attributed to recent abdominal  surgery with associated small  incisional hernia containing fat. 6. Additional incidental findings including prostatomegaly, cholelithiasis and Aortic Atherosclerosis (ICD10-I70.0).   06/11/2021 - 11/26/2021 Chemotherapy   Patient is on Treatment Plan : COLORECTALXeloda + Bevacizumab q21d     07/29/2021 Imaging   EXAMINATION: CT ABDOMEN PELVIS WO CONTRAST  Impression  1. Left anterior abdominal wall postsurgical changes with no recurrent hernia.  2. Stable indeterminate 3 cm ventral abdominal wall mass located at the L1-2 level. Question scarring from prior surgery versus neoplasm.  3. Cholelithiasis.  4. Stable indeterminate bibasilar lung nodules. All of with a CT chest is recommended.  5. Indeterminate stable right hepatic lesions. Consider follow-up with an abdominal MRI with and without contrast for additional characterization.     12/17/2021 -  Chemotherapy   Patient is on Treatment Plan : COLORECTAL CapeOx + Bevacizumab q21d        INTERVAL HISTORY:  KARSYN ROCHIN is here for a follow up of metastatic colon cancer. He was last seen by me on 11/26/21. He presents to the clinic accompanied by his wife.  His main complaint is a left upper quadrant abdominal pain when he stands or walks for 5 to 10 minutes, it is severe and he has to lay down, which will make the pain go away.  This has significantly impacted his activity level, he had to cancel his trip to [place] recently.  I called in Tylenol 3 last week, he has not taking much due to the drowsiness.  He took a few doses of tramadol, overall he does not like narcotics due to the fear of dependence.  He otherwise doing well, he started the current cycle of Xeloda 3 days ago.  No other new complaints.   All other systems were reviewed with the patient and are negative.  MEDICAL HISTORY:  Past Medical History:  Diagnosis Date   Adenocarcinoma, colon (HCC) dx'd 01/2018   Anemia    taking iron supplements   Anxiety    Atrial fibrillation with RVR (HCC)     Colonic obstruction (HCC) 01/10/2018   Depression    Diverticulitis    Dysrhythmia    afib   History of kidney stones    Hypertension    Indwelling Foley catheter present    due to UTI    SURGICAL HISTORY: Past Surgical History:  Procedure Laterality Date   ANKLE SURGERY Left    when he was in college   COLON RESECTION N/A 01/11/2018   Procedure: LEFT COLON RESECTION, TAKEDOWN SPLENIC FLEXURE, COLOSTOMY;  Surgeon: Claud Kelp, MD;  Location: [facility];  Service: General;  Laterality: N/A;   COLONOSCOPY  01/11/2018   Procedure: COLONOSCOPY;  Surgeon: Lynann Bologna, MD;  Location: [facility];  Service: Endoscopy;;   COLONOSCOPY  05/10/2018   colonscopy  05/10/2018   EXCISION OF KELOID N/A 05/05/2021   Procedure: ABDOMINAL SCAR EXCISION;  Surgeon: Axel Filler, MD;  Location: [facility] OR;  Service: General;  Laterality: N/A;   FOREIGN BODY REMOVAL ABDOMINAL N/A 03/09/2021   Procedure: EXCISION OF FOREIGN BODY;  Surgeon: Axel Filler, MD;  Location: [facility] OR;  Service: General;  Laterality: N/A;   HERNIA REPAIR     HERNIA REPAIR  03/02/2019   EXPLORATORY LAPAROTOMY (N/A Abdomen)   INCISIONAL HERNIA REPAIR N/A 03/02/2019   Procedure: INCISIONAL HERNIA REPAIR , RECTORECTUS VS TAR HERNIA REPAIR;  Surgeon: Axel Filler, MD;  Location: [facility] OR;  Service: General;  Laterality: N/A;   INSERTION OF MESH N/A 03/02/2019  Procedure: Insertion Of Mesh;  Surgeon: Ralene Ok, MD;  Location: [place];  Service: General;  Laterality: N/A;   LAPAROTOMY N/A 03/02/2019   Procedure: EXPLORATORY LAPAROTOMY;  Surgeon: Ralene Ok, MD;  Location: [place];  Service: General;  Laterality: N/A;   LAPAROTOMY N/A 03/09/2021   Procedure: EXPLORATION OF ABDOMINAL WALL;  Surgeon: Ralene Ok, MD;  Location: [place];  Service: General;  Laterality: N/A;   LYSIS OF ADHESION N/A 06/12/2018   Procedure: LYSIS OF ADHESIONS;  Surgeon: [name], MD;  Location: [facility];  Service: General;  Laterality: N/A;   LYSIS OF  ADHESION N/A 03/02/2019   Procedure: Lysis Of Adhesion;  Surgeon: Ralene Ok, MD;  Location: [place];  Service: General;  Laterality: N/A;   PORTACATH PLACEMENT Right 03/07/2018   Procedure: INSERTION PORT-A-CATH RIGHT SUBCLAVIAN;  Surgeon: [name], MD;  Location: [place];  Service: General;  Laterality: Right;   PROCTOSCOPY N/A 06/12/2018   Procedure: RIGID PROCTOSCOPY;  Surgeon: [name], MD;  Location: [facility];  Service: General;  Laterality: N/A;   thumb surgery   2018   cyst removal    I have reviewed the social history and family history with the patient and they are unchanged from previous note.  ALLERGIES:  has No Known Allergies.  MEDICATIONS:  Current Outpatient Medications  Medication Sig Dispense Refill   acetaminophen-codeine (TYLENOL #3) 300-30 MG tablet Take 1 tablet by mouth every 8 (eight) hours as needed for moderate pain. 20 tablet 0   apixaban (ELIQUIS) 5 MG TABS tablet Take 1 tablet (5 mg total) by mouth 2 (two) times daily. 60 tablet 6   b complex vitamins tablet Take 1 tablet by mouth daily.     capecitabine (XELODA) 500 MG tablet TAKE 3 TABLETS BY MOUTH EVERY 12 HOURS AFTER A MEAL FOR 14 DAYS  ON THEN 7 DAYS OFF 84 tablet 1   dexamethasone (DECADRON) 4 MG tablet Take 2 tablets (8 mg total) by mouth daily. Start the day after chemotherapy for 2 days. Take with food. 30 tablet 1   diltiazem (CARDIZEM CD) 360 MG 24 hr capsule TAKE 1 CAPSULE(360 MG) BY MOUTH DAILY 90 capsule 3   finasteride (PROSCAR) 5 MG tablet Take 5 mg by mouth daily.     levothyroxine (SYNTHROID) 75 MCG tablet TAKE 1 TABLET(75 MCG) BY MOUTH DAILY 90 tablet 1   lidocaine-prilocaine (EMLA) cream Apply 1 application topically as needed. 30 g 1   lidocaine-prilocaine (EMLA) cream Apply to affected area once 30 g 3   LORazepam (ATIVAN) 0.5 MG tablet Take 1 tablet (0.5 mg total) by mouth every 6 (six) hours as needed (Nausea or vomiting). 30 tablet 0   Multiple Vitamins-Iron (MULTIVITAMIN/IRON  PO) Take 1 tablet by mouth daily.      ondansetron (ZOFRAN) 8 MG tablet Take 1 tablet (8 mg total) by mouth 2 (two) times daily as needed for refractory nausea / vomiting. Start on day 3 after chemotherapy. 30 tablet 1   prochlorperazine (COMPAZINE) 10 MG tablet Take 1 tablet (10 mg total) by mouth every 6 (six) hours as needed (Nausea or vomiting). 30 tablet 1   tamsulosin (FLOMAX) 0.4 MG CAPS capsule Take 0.4 mg by mouth 2 (two) times daily.      No current facility-administered medications for this visit.   Facility-Administered Medications Ordered in Other Visits  Medication Dose Route Frequency Provider Last Rate Last Admin   oxaliplatin (ELOXATIN) 135 mg in dextrose 5 % 500 mL chemo infusion  60 mg/m2 (Treatment Plan Recorded) Intravenous Once [name], MD 264 mL/hr at 12/17/21 1307 135 mg at 12/17/21 1307    PHYSICAL EXAMINATION: ECOG PERFORMANCE STATUS: 2 - Symptomatic, <50% confined to bed  Vitals:   12/17/21 1137  BP: (!) 141/85  Pulse: 73  Resp: 18  Temp: 98.4 F (36.9 C)  SpO2: 97%   Wt Readings from Last 3 Encounters:  12/17/21 221 lb 6.4 oz (100.4 kg)  11/26/21 225 lb 8 oz (102.3 kg)  11/12/21 224 lb 12.8 oz (102 kg)     GENERAL:alert, no distress and comfortable SKIN: skin color, texture, turgor are normal, no rashes or significant lesions EYES: normal, Conjunctiva are pink and non-injected, sclera clear NECK: supple, thyroid normal size, non-tender, without nodularity LYMPH:  no palpable lymphadenopathy in the cervical, axillary  LUNGS: clear to auscultation and percussion with normal breathing effort HEART: regular rate & rhythm and no murmurs and no lower extremity edema ABDOMEN:abdomen soft, non-tender and normal bowel sounds, old surgical incisions in the left upper quadrant of the abdomen, palpable subcutaneous mass about 2 to 3 cm just below the incision with tenderness.  Musculoskeletal:no cyanosis of digits and no clubbing  NEURO: alert & oriented x 3  with fluent speech, no focal motor/sensory deficits  LABORATORY DATA:  I have reviewed the data as listed    Latest Ref Rng & Units 12/17/2021   11:22 AM 11/26/2021   12:12 PM 11/12/2021   11:36 AM  CBC  WBC 4.0 - 10.5 K/uL 6.5  7.1  7.9   Hemoglobin 13.0 - 17.0 g/dL 15.3  14.7  15.0   Hematocrit 39.0 - 52.0 % 44.4  41.0  43.2   Platelets 150 - 400 K/uL 172  200  192         Latest Ref Rng & Units 12/17/2021   11:22 AM 11/26/2021   12:12 PM 11/12/2021   11:36 AM  CMP  Glucose 70 - 99 mg/dL 114  93  86   BUN 8 - 23 mg/dL $Remove'25  18  18   'XKmGrOn$ Creatinine 0.61 - 1.24 mg/dL 1.19  1.16  1.02   Sodium 135 - 145 mmol/L 138  140  138   Potassium 3.5 - 5.1 mmol/L 4.2  4.1  4.0   Chloride 98 - 111 mmol/L 102  106  102   CO2 22 - 32 mmol/L $RemoveB'31  30  31   'RUSYFiWW$ Calcium 8.9 - 10.3 mg/dL 9.5  9.3  9.5   Total Protein 6.5 - 8.1 g/dL 7.3  6.9  7.1   Total Bilirubin 0.3 - 1.2 mg/dL 0.9  0.7  1.2   Alkaline Phos 38 - 126 U/L 103  89  105   AST 15 - 41 U/L $Remo'21  19  19   'brIbs$ ALT 0 - 44 U/L $Remo'16  13  16       'ClWMF$ RADIOGRAPHIC STUDIES: I have personally reviewed the radiological images as listed and agreed with the findings in the report. No results found.    No orders of the defined types were placed in this encounter.  All questions were answered. The patient knows to call the clinic with any problems, questions or concerns. No barriers to learning was detected. The total time spent in the appointment was 30 minutes.     [name], MD 12/17/2021   I, Wilburn Mylar, am acting as scribe for [name], MD.   I have reviewed the above documentation for accuracy and completeness, and I  agree with the above.

## 2021-12-17 NOTE — Progress Notes (Signed)
Per Dr. Burr Medico, hold Avastin on 12/17/2021 d/t elevated urine protein.  Proceed with Oxaliplatin today 12/17/2021.

## 2021-12-18 ENCOUNTER — Telehealth: Payer: Self-pay | Admitting: Hematology

## 2021-12-18 NOTE — Telephone Encounter (Signed)
Scheduled follow-up appointment per 7/13 los. Patient's wife is aware.

## 2021-12-22 ENCOUNTER — Encounter: Payer: Self-pay | Admitting: Radiation Oncology

## 2021-12-22 NOTE — Progress Notes (Signed)
Telephone appointment. I verified patient's identity and began nursing interview. Patient reports LLQ ABD pain 6/10. No other issues conveyed at this time.  Meaningful use complete. I-PSS score of 3-mild. Flomax as directed. Urology appt- July, 2023  Reminded patient of his 11:00am-12/23/21 telephone appointment w/ Shona Simpson PA-C. I left my extension [phone] in case patient needs anything. Patient verbalized understanding.  Patient contact [phone]

## 2021-12-23 ENCOUNTER — Ambulatory Visit: Admission: RE | Admit: 2021-12-23 | Payer: BC Managed Care – PPO | Source: Ambulatory Visit

## 2021-12-23 ENCOUNTER — Ambulatory Visit
Admission: RE | Admit: 2021-12-23 | Discharge: 2021-12-23 | Disposition: A | Discharge status: Home / Self Care | Payer: BC Managed Care – PPO | Source: Ambulatory Visit | Attending: Radiation Oncology | Admitting: Radiation Oncology

## 2021-12-23 DIAGNOSIS — C189 Malignant neoplasm of colon, unspecified: Secondary | ICD-10-CM | POA: Diagnosis not present

## 2021-12-23 DIAGNOSIS — C186 Malignant neoplasm of descending colon: Secondary | ICD-10-CM

## 2021-12-23 NOTE — Progress Notes (Signed)
Radiation Oncology         [phone] ________________________________  Initial Outpatient Consultation - Conducted via telephone at patient request.  I spoke with the patient to conduct this consult visit via telephone. The patient was notified in advance and was offered an in person or telemedicine meeting to allow for face to face communication but instead preferred to proceed with a telephone consult.   ________________________________  Name: Dave Vaughn        MRN: [id number]  Date of Service: 12/23/2021 DOB: 12-26-1945  UX:[id number], Steele Berg, MD (Inactive)  [name], MD     REFERRING PHYSICIAN: [name], MD   DIAGNOSIS: The encounter diagnosis was Cancer of left colon [facility]).   HISTORY OF PRESENT ILLNESS: Dave Vaughn is a 76 y.o. male seen at the request of Dr. Burr Medico for a diagnosis of recurrent metastatic adenocarcinoma of the colon.  The patient was originally diagnosed with left-sided colon cancer and underwent surgical resection.  He was diagnosed with stage III disease based on final pathology and he received adjuvant FOLFOX but did not tolerate this well and discontinued therapy.  He was found to have recurrence in the left abdominal wall and peritoneal metastases with liver disease as well in 2021, biopsy of his abdominal wall confirm this.  There was a K-ras mutation identified.  He received 8 cycles of FOLFIRI and bevacizumab had also been added.  He was switched to maintenance Xeloda and bevacizumab had to be held due to proteinuria.  He has had multiple surgical attempts at repairing hernia at the site of his prior ostomy where scar tissue and cancer has been found.  Recent restaging imaging of the abdomen in June 2023 showed an increase in his hepatic disease and progressive of his left abdominal wall disease.  In my personal review it appears that the area of thickening along the left anterior abdominal wall spans greater than 12 cm.  And by the report the midline  lesion along his surgical scar measures in total up to 3.7 cm with enhancing nodularity anterior to the left abdominal wall musculature at the site of prior fluid collection measuring 718 mm and a small nodular density in the left rectus measured 1 cm.  Stable lower lung pulmonary nodules were also seen but felt to be consistent with metastatic disease as well.  Given his symptoms and difficulties with pain as it is disrupting his quality of life he is contacted by phone to review options of palliative radiotherapy to the abdominal wall.  PREVIOUS RADIATION THERAPY: No   PAST MEDICAL HISTORY:  Past Medical History:  Diagnosis Date   Adenocarcinoma, colon ([place]) dx'd 01/2018   Anemia    taking iron supplements   Anxiety    Atrial fibrillation with RVR (HCC)    Colonic obstruction ([place]) 01/10/2018   Depression    Diverticulitis    Dysrhythmia    afib   History of kidney stones    Hypertension    Indwelling Foley catheter present    due to UTI       PAST SURGICAL HISTORY: Past Surgical History:  Procedure Laterality Date   ANKLE SURGERY Left    when he was in college   COLON RESECTION N/A 01/11/2018   Procedure: LEFT COLON RESECTION, TAKEDOWN SPLENIC FLEXURE, COLOSTOMY;  Surgeon: [name], MD;  Location: [facility];  Service: General;  Laterality: N/A;   COLONOSCOPY  01/11/2018   Procedure: COLONOSCOPY;  Surgeon: Jackquline Denmark, MD;  Location: [facility];  Service: Endoscopy;;   COLONOSCOPY  05/10/2018   colonscopy  05/10/2018   EXCISION OF KELOID N/A 05/05/2021   Procedure: ABDOMINAL SCAR EXCISION;  Surgeon: Ralene Ok, MD;  Location: [place];  Service: General;  Laterality: N/A;   FOREIGN BODY REMOVAL ABDOMINAL N/A 03/09/2021   Procedure: EXCISION OF FOREIGN BODY;  Surgeon: Ralene Ok, MD;  Location: [place];  Service: General;  Laterality: N/A;   HERNIA REPAIR     HERNIA REPAIR  03/02/2019   EXPLORATORY LAPAROTOMY (N/A Abdomen)   INCISIONAL HERNIA REPAIR N/A 03/02/2019    Procedure: INCISIONAL HERNIA REPAIR , RECTORECTUS VS TAR HERNIA REPAIR;  Surgeon: Ralene Ok, MD;  Location: [place];  Service: General;  Laterality: N/A;   INSERTION OF MESH N/A 03/02/2019   Procedure: Insertion Of Mesh;  Surgeon: Ralene Ok, MD;  Location: [facility] OR;  Service: General;  Laterality: N/A;   LAPAROTOMY N/A 03/02/2019   Procedure: EXPLORATORY LAPAROTOMY;  Surgeon: Ralene Ok, MD;  Location: [facility] OR;  Service: General;  Laterality: N/A;   LAPAROTOMY N/A 03/09/2021   Procedure: EXPLORATION OF ABDOMINAL WALL;  Surgeon: Ralene Ok, MD;  Location: [place];  Service: General;  Laterality: N/A;   LYSIS OF ADHESION N/A 06/12/2018   Procedure: LYSIS OF ADHESIONS;  Surgeon: [name], MD;  Location: [facility];  Service: General;  Laterality: N/A;   LYSIS OF ADHESION N/A 03/02/2019   Procedure: Lysis Of Adhesion;  Surgeon: Ralene Ok, MD;  Location: [facility] OR;  Service: General;  Laterality: N/A;   PORTACATH PLACEMENT Right 03/07/2018   Procedure: INSERTION PORT-A-CATH RIGHT SUBCLAVIAN;  Surgeon: [name], MD;  Location: [place];  Service: General;  Laterality: Right;   PROCTOSCOPY N/A 06/12/2018   Procedure: RIGID PROCTOSCOPY;  Surgeon: [name], MD;  Location: [facility];  Service: General;  Laterality: N/A;   thumb surgery   2018   cyst removal     FAMILY HISTORY:  Family History  Problem Relation Age of Onset   Breast cancer Mother 67       metastatin; recurrence x7   Heart attack Father 18   Cancer Daughter        melanoma, leukemia   Esophageal cancer Neg Hx    Colon cancer Neg Hx    Rectal cancer Neg Hx    Ulcerative colitis Neg Hx      SOCIAL HISTORY:  reports that he has never smoked. He quit smokeless tobacco use about 2 years ago.  His smokeless tobacco use included chew. He reports current alcohol use of about 2.0 standard drinks of alcohol per week. He reports that he does not use drugs. The patient is married and lives in [place]. 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  He and his family are looking forward to a trip to the [place] in August and plan to leave on 01/23/2022.   ALLERGIES: Patient has no known allergies.   MEDICATIONS:  Current Outpatient Medications  Medication Sig Dispense Refill   acetaminophen-codeine (TYLENOL #3) 300-30 MG tablet Take 1 tablet by mouth every 8 (eight) hours as needed for moderate pain. 20 tablet 0   apixaban (ELIQUIS) 5 MG TABS tablet Take 1 tablet (5 mg total) by mouth 2 (two) times daily. 60 tablet 6   b complex vitamins tablet Take 1 tablet by mouth daily.     capecitabine (XELODA) 500 MG tablet TAKE 3 TABLETS BY MOUTH EVERY 12 HOURS AFTER A MEAL FOR 14 DAYS  ON THEN 7  DAYS OFF 84 tablet 1   dexamethasone (DECADRON) 4 MG tablet Take 2 tablets (8 mg total) by mouth daily. Start the day after chemotherapy for 2 days. Take with food. 30 tablet 1   diltiazem (CARDIZEM CD) 360 MG 24 hr capsule TAKE 1 CAPSULE(360 MG) BY MOUTH DAILY 90 capsule 3   finasteride (PROSCAR) 5 MG tablet Take 5 mg by mouth daily.     levothyroxine (SYNTHROID) 75 MCG tablet TAKE 1 TABLET(75 MCG) BY MOUTH DAILY 90 tablet 1   lidocaine-prilocaine (EMLA) cream Apply 1 application topically as needed. 30 g 1   lidocaine-prilocaine (EMLA) cream Apply to affected area once 30 g 3   LORazepam (ATIVAN) 0.5 MG tablet Take 1 tablet (0.5 mg total) by mouth every 6 (six) hours as needed (Nausea or vomiting). 30 tablet 0   Multiple Vitamins-Iron (MULTIVITAMIN/IRON PO) Take 1 tablet by mouth daily.      ondansetron (ZOFRAN) 8 MG tablet Take 1 tablet (8 mg total) by mouth 2 (two) times daily as needed for refractory nausea / vomiting. Start on day 3 after chemotherapy. (Patient not taking: Reported on 12/22/2021) 30 tablet 1   prochlorperazine (COMPAZINE) 10 MG tablet Take 1 tablet (10 mg total) by mouth every 6 (six) hours as needed (Nausea or vomiting). 30 tablet 1   tamsulosin (FLOMAX) 0.4 MG CAPS capsule Take 0.4 mg by mouth 2  (two) times daily.      No current facility-administered medications for this encounter.     REVIEW OF SYSTEMS: On review of systems, the patient reports that he would be unaware of his cancer diagnosis regarding symptoms with the exception of his abdominal wall pain. He reports he is able to ride horses and work, but cannot stand longer than 15 minutes without his abdomen hurting. He reports sitting and laying flat do not bring on the pain, and he has to change positions often to try to alleviate pain when standing. He as Tylenol #3 for pain relief if needed.  No other complaints are verbalized.      PHYSICAL EXAM:   Unable to assess due to encounter type.     ECOG = 1  0 - Asymptomatic (Fully active, able to carry on all predisease activities without restriction)  1 - Symptomatic but completely ambulatory (Restricted in physically strenuous activity but ambulatory and able to carry out work of a light or sedentary nature. For example, light housework, office work)  2 - Symptomatic, <50% in bed during the day (Ambulatory and capable of all self care but unable to carry out any work activities. Up and about more than 50% of waking hours)  3 - Symptomatic, >50% in bed, but not bedbound (Capable of only limited self-care, confined to bed or chair 50% or more of waking hours)  4 - Bedbound (Completely disabled. Cannot carry on any self-care. Totally confined to bed or chair)  5 - Death   Eustace Pen MM, Creech RH, Tormey DC, et al. [phone]). "Toxicity and response criteria of the [facility]". [employer] Oncol. 5 (6): 649-55    LABORATORY DATA:  Lab Results  Component Value Date   WBC 6.5 12/17/2021   HGB 15.3 12/17/2021   HCT 44.4 12/17/2021   MCV 96.1 12/17/2021   PLT 172 12/17/2021   Lab Results  Component Value Date   NA 138 12/17/2021   K 4.2 12/17/2021   CL 102 12/17/2021   CO2 31 12/17/2021   Lab Results  Component Value  Date   ALT 16 12/17/2021    AST 21 12/17/2021   ALKPHOS 103 12/17/2021   BILITOT 0.9 12/17/2021      RADIOGRAPHY: CT ABDOMEN W CONTRAST  Result Date: 11/26/2021 CLINICAL DATA:  Colorectal carcinoma metastasis. EXAM: CT ABDOMEN WITH CONTRAST TECHNIQUE: Multidetector CT imaging of the abdomen was performed using the standard protocol following bolus administration of intravenous contrast. RADIATION DOSE REDUCTION: This exam was performed according to the departmental dose-optimization program which includes automated exposure control, adjustment of the mA and/or kV according to patient size and/or use of iterative reconstruction technique. CONTRAST:  182m ISOVUE-300 IOPAMIDOL (ISOVUE-300) INJECTION 61% COMPARISON:  CT chest 09/16/2021, CT chest abdomen pelvis 06/02/2021 FINDINGS: Lower chest: Multiple round pulmonary nodules at the lung bases again demonstrated. Example nodule in the RIGHT lower lobe measures 10 mm (image 18/5) compared to 9 mm 7 mm nodule on image 31 RIGHT lower lobe measures 7 mm. LEFT lobe nodule measures 7 mm (image 9) compared to 8 mm Hepatobiliary: Comparison to noncontrast chest CT 09/16/2021, lesion in the medial RIGHT hepatic lobe measuring 27 mm (image 28/series 2 increased from 16 mm. Lesion in the anterior subcapsular LEFT hepatic lobe measures 22 mm (image 23) compared to 17 mm. Bilobed lesion along the gallbladder fossa measuring 34 mm (image 30) increased from 30 mm. There is differing technique between contrast noncontrast imaging. No new lesions identified. Pancreas: Normal pancreatic parenchymal intensity. No ductal dilatation or inflammation. Spleen: Normal spleen. Adrenals/urinary tract: Adrenal glands and kidneys are normal. Stomach/Bowel: Stomach and limited of the small bowel is unremarkable Vascular/Lymphatic: Abdominal aortic normal caliber. No retroperitoneal periportal lymphadenopathy. Musculoskeletal: Midline lesion along the surgical scar is increased in size measuring 37 mm x 22 mm  (image 37/2) compared to 31 mm x 23 mm (CT 09/16/2021). Additional enhancing nodularity anterior to the LEFT abdominal wall musculature at site of prior fluid collection measures 17 mm (49/2). Small nodular density medial to LEFT rectus muscle measuring 10 mm (51/2) is also concerning. IMPRESSION: 1. Stable lower lobe pulmonary nodule metastasis. 2. Mild interval increase in known hepatic metastasis. No new lesions identified. 3. Progression of nodularity along the ventral midline scar and new nodularity along the LEFT abdominal wall. 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 Electronically Signed   By: SSuzy BouchardM.D.   On: 11/26/2021 08:34       IMPRESSION/PLAN: 1. 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 The patient has liver, abdominal wall, and peritoneal metastases. Dr. MLisbeth Renshawhas reviewed his course to date including prior therapy. Dr. MLisbeth Renshawrecommends a course of palliative radiotherapy. As Dr. FBurr Medicoalso plans palliative use of oral Xeloda, Dr. MLisbeth Renshawwould recommend a course of 3 weeks of radiotherapy to the abdominal wall recurrence.  We discussed the risks, benefits, short, and long term effects of radiotherapy, as well as the palliative intent, and the patient is interested in proceeding. I reviewed the delivery and logistics of radiotherapy with him. The patient will simulate next Wednesday morning, with treatment starting on 01/04/22 and completing prior to his 01/23/22 vacation. He will sign written consent to proceed when he comes for simulation.  2. Abdominal wall pain. It is difficult to discern his symptoms solely being his cancer, versus multiple surgical procedures and scaring. He is interested in any intervention that would help possibly improve his symptoms with greater benefit to risk ratio. He is in agreement that he would like to try radiation to see if  this can make a meaningful impact on his  quality of life.    This encounter was conducted via telephone.  The patient has provided two factor identification and has given verbal consent for this type of encounter and has been advised to only accept a meeting of this type in a secure network environment. The time spent during this encounter was 60 minutes including preparation, discussion, and coordination of the patient's care. The attendants for this meeting include Dave Vaughn  and Dave Vaughn.  During the encounter,   Dave Vaughn was located at [facility] Radiation Oncology Department.  Dave Vaughn was located at home.     Carola Rhine, [facility]   **Disclaimer: This note was dictated with voice recognition software. Similar sounding words can inadvertently be transcribed and this note may contain transcription errors which may not have been corrected upon publication of note.**

## 2021-12-24 DIAGNOSIS — C7989 Secondary malignant neoplasm of other specified sites: Secondary | ICD-10-CM | POA: Diagnosis not present

## 2021-12-24 DIAGNOSIS — C786 Secondary malignant neoplasm of retroperitoneum and peritoneum: Secondary | ICD-10-CM | POA: Diagnosis not present

## 2021-12-24 DIAGNOSIS — C186 Malignant neoplasm of descending colon: Secondary | ICD-10-CM | POA: Diagnosis not present

## 2021-12-27 ENCOUNTER — Other Ambulatory Visit: Payer: Self-pay | Admitting: Hematology

## 2021-12-27 DIAGNOSIS — C186 Malignant neoplasm of descending colon: Secondary | ICD-10-CM

## 2021-12-28 ENCOUNTER — Telehealth: Payer: Self-pay | Admitting: Internal Medicine

## 2021-12-28 ENCOUNTER — Other Ambulatory Visit: Payer: Self-pay

## 2021-12-29 ENCOUNTER — Telehealth: Payer: Self-pay | Admitting: Radiation Oncology

## 2021-12-29 NOTE — Telephone Encounter (Signed)
I called the patient and left a voicemail asking him to let us know how we can help since we received a message that he cancelled his appointment for simulation.

## 2021-12-30 ENCOUNTER — Ambulatory Visit: Payer: BC Managed Care – PPO | Admitting: Radiation Oncology

## 2022-01-05 ENCOUNTER — Other Ambulatory Visit: Payer: Self-pay

## 2022-01-14 ENCOUNTER — Inpatient Hospital Stay: Payer: BC Managed Care – PPO

## 2022-01-14 ENCOUNTER — Other Ambulatory Visit: Payer: Self-pay | Admitting: Hematology

## 2022-01-14 ENCOUNTER — Inpatient Hospital Stay: Payer: BC Managed Care – PPO | Admitting: Hematology

## 2022-01-14 ENCOUNTER — Other Ambulatory Visit: Payer: Self-pay

## 2022-01-14 ENCOUNTER — Encounter: Payer: Self-pay | Admitting: Hematology

## 2022-01-14 ENCOUNTER — Inpatient Hospital Stay: Payer: BC Managed Care – PPO | Attending: Nurse Practitioner

## 2022-01-14 VITALS — BP 158/94 | HR 75 | Temp 98.5°F | Resp 15 | Wt 221.6 lb

## 2022-01-14 DIAGNOSIS — C787 Secondary malignant neoplasm of liver and intrahepatic bile duct: Secondary | ICD-10-CM | POA: Insufficient documentation

## 2022-01-14 DIAGNOSIS — C186 Malignant neoplasm of descending colon: Secondary | ICD-10-CM | POA: Diagnosis not present

## 2022-01-14 DIAGNOSIS — Z5112 Encounter for antineoplastic immunotherapy: Secondary | ICD-10-CM | POA: Diagnosis not present

## 2022-01-14 DIAGNOSIS — I129 Hypertensive chronic kidney disease with stage 1 through stage 4 chronic kidney disease, or unspecified chronic kidney disease: Secondary | ICD-10-CM | POA: Diagnosis not present

## 2022-01-14 DIAGNOSIS — Z7901 Long term (current) use of anticoagulants: Secondary | ICD-10-CM | POA: Insufficient documentation

## 2022-01-14 DIAGNOSIS — N400 Enlarged prostate without lower urinary tract symptoms: Secondary | ICD-10-CM | POA: Diagnosis not present

## 2022-01-14 DIAGNOSIS — Z95828 Presence of other vascular implants and grafts: Secondary | ICD-10-CM

## 2022-01-14 DIAGNOSIS — N183 Chronic kidney disease, stage 3 unspecified: Secondary | ICD-10-CM | POA: Diagnosis not present

## 2022-01-14 DIAGNOSIS — C786 Secondary malignant neoplasm of retroperitoneum and peritoneum: Secondary | ICD-10-CM | POA: Insufficient documentation

## 2022-01-14 DIAGNOSIS — Z79890 Hormone replacement therapy (postmenopausal): Secondary | ICD-10-CM | POA: Diagnosis not present

## 2022-01-14 DIAGNOSIS — Z5111 Encounter for antineoplastic chemotherapy: Secondary | ICD-10-CM | POA: Diagnosis not present

## 2022-01-14 DIAGNOSIS — E039 Hypothyroidism, unspecified: Secondary | ICD-10-CM | POA: Diagnosis not present

## 2022-01-14 DIAGNOSIS — I4891 Unspecified atrial fibrillation: Secondary | ICD-10-CM | POA: Diagnosis not present

## 2022-01-14 LAB — CBC WITH DIFFERENTIAL (CANCER CENTER ONLY)
Abs Immature Granulocytes: 0.02 10*3/uL (ref 0.00–0.07)
Basophils Absolute: 0 10*3/uL (ref 0.0–0.1)
Basophils Relative: 1 %
Eosinophils Absolute: 0.1 10*3/uL (ref 0.0–0.5)
Eosinophils Relative: 2 %
HCT: 42.8 % (ref 39.0–52.0)
Hemoglobin: 14.8 g/dL (ref 13.0–17.0)
Immature Granulocytes: 0 %
Lymphocytes Relative: 32 %
Lymphs Abs: 2 10*3/uL (ref 0.7–4.0)
MCH: 32.7 pg (ref 26.0–34.0)
MCHC: 34.6 g/dL (ref 30.0–36.0)
MCV: 94.7 fL (ref 80.0–100.0)
Monocytes Absolute: 0.6 10*3/uL (ref 0.1–1.0)
Monocytes Relative: 10 %
Neutro Abs: 3.5 10*3/uL (ref 1.7–7.7)
Neutrophils Relative %: 55 %
Platelet Count: 174 10*3/uL (ref 150–400)
RBC: 4.52 MIL/uL (ref 4.22–5.81)
RDW: 13.4 % (ref 11.5–15.5)
WBC Count: 6.3 10*3/uL (ref 4.0–10.5)
nRBC: 0 % (ref 0.0–0.2)

## 2022-01-14 LAB — CMP (CANCER CENTER ONLY)
ALT: 15 U/L (ref 0–44)
AST: 20 U/L (ref 15–41)
Albumin: 4.2 g/dL (ref 3.5–5.0)
Alkaline Phosphatase: 96 U/L (ref 38–126)
Anion gap: 4 — ABNORMAL LOW (ref 5–15)
BUN: 25 mg/dL — ABNORMAL HIGH (ref 8–23)
CO2: 31 mmol/L (ref 22–32)
Calcium: 9.2 mg/dL (ref 8.9–10.3)
Chloride: 103 mmol/L (ref 98–111)
Creatinine: 1.03 mg/dL (ref 0.61–1.24)
GFR, Estimated: >60 mL/min (ref >60)
Glucose, Bld: 103 mg/dL — ABNORMAL HIGH (ref 70–99)
Potassium: 4.1 mmol/L (ref 3.5–5.1)
Sodium: 138 mmol/L (ref 135–145)
Total Bilirubin: 0.8 mg/dL (ref 0.3–1.2)
Total Protein: 7.1 g/dL (ref 6.5–8.1)

## 2022-01-14 LAB — TOTAL PROTEIN, URINE DIPSTICK: Protein, ur: 100 mg/dL — AB

## 2022-01-14 LAB — CEA (IN HOUSE-CHCC): CEA (CHCC-In House): 37.35 ng/mL — ABNORMAL HIGH (ref 0.00–5.00)

## 2022-01-14 MED ORDER — SODIUM CHLORIDE 0.9% FLUSH
10.0000 mL | INTRAVENOUS | Status: DC | PRN
  Administered 2022-01-14: 10 mL

## 2022-01-14 MED ORDER — SODIUM CHLORIDE 0.9 % IV SOLN
10.0000 mg | Freq: Once | INTRAVENOUS | Status: AC
  Administered 2022-01-14: 10 mg via INTRAVENOUS
  Filled 2022-01-14: qty 10

## 2022-01-14 MED ORDER — HEPARIN SOD (PORK) LOCK FLUSH 100 UNIT/ML IV SOLN
500.0000 [IU] | Freq: Once | INTRAVENOUS | Status: AC | PRN
  Administered 2022-01-14: 500 [IU]

## 2022-01-14 MED ORDER — DEXTROSE 5 % IV SOLN: Freq: Once | INTRAVENOUS | Status: AC

## 2022-01-14 MED ORDER — PALONOSETRON HCL INJECTION 0.25 MG/5ML
0.2500 mg | Freq: Once | INTRAVENOUS | Status: AC
  Administered 2022-01-14: 0.25 mg via INTRAVENOUS

## 2022-01-14 MED ORDER — SODIUM CHLORIDE 0.9 % IV SOLN: Freq: Once | INTRAVENOUS | Status: AC

## 2022-01-14 MED ORDER — OXALIPLATIN CHEMO INJECTION 100 MG/20ML
60.0000 mg/m2 | Freq: Once | INTRAVENOUS | Status: AC
  Administered 2022-01-14: 135 mg via INTRAVENOUS
  Filled 2022-01-14: qty 27

## 2022-01-14 MED ORDER — PALONOSETRON HCL INJECTION 0.25 MG/5ML
INTRAVENOUS | Status: AC
  Filled 2022-01-14: qty 5

## 2022-01-14 MED ORDER — SODIUM CHLORIDE 0.9 % IV SOLN
7.5000 mg/kg | Freq: Once | INTRAVENOUS | Status: AC
  Administered 2022-01-14: 800 mg via INTRAVENOUS
  Filled 2022-01-14: qty 32

## 2022-01-14 NOTE — Progress Notes (Signed)
Per Dr Burr Medico ok to treat with urine protein 100

## 2022-01-14 NOTE — Progress Notes (Signed)
[place]   [phone] [phone]   Clinic Follow up Note   Patient Care Team: [name], MD (Inactive) as PCP - General (Family Medicine) [name], MD as Consulting Physician (Cardiology) [name], MD as Consulting Physician (Gastroenterology) [name], MD as Consulting Physician (General Surgery) [name], MD as Consulting Physician (General Surgery) [name], MD as Consulting Physician (Urology) [name], MD as Consulting Physician (Medical Oncology)  Date of Service:  01/14/2022  CHIEF COMPLAINT: f/u of metastatic colon cancer  CURRENT THERAPY:  -Bevacizumab/Xeloda restarted 08/12/21             -Xeloda dose: 1537m BID, days 1-14 q21days -Oxaliplatin restarted 12/17/21  ASSESSMENT & PLAN:  Dave BIBYis a 76y.o. male with   1. Cancer of left colon, adenocarcinoma, stage IIIB (pT3N1cM0), MSI-stable, KRAS Q61H mutation (+), liver and peritoneal metastasis in 12/2019  -Diagnosed in 01/2018. S/p surgery and adjuvant FOLFOX. He did not tolerate well and declined additional treatment at that time. -restaging in 12/2019 revealed local recurrence in left abdominal wall and peritoneal metastasis, 2 hypermetabolic liver mets. Abdominal wall biopsy confirmed metastatic colorectal adenocarcinoma in 01/2020  -FO showed KRAS mutation (+), he is not a candidate for EGFR inhibitor.   -received 8 cycles FOLFIRI 01/28/20 - 05/14/20, Bevacizumab added with cycle 2. Tolerated well with fatigue, nausea, diarrhea. CEA normalized after cycle 4 -switched to maintenance xeloda and beva 06/02/20. Beva was held 01/13/21 - 06/10/21 due to high proteinuria -abdominal scar excision on 05/05/21 under Dr. RRosendo Gros Pathology revealed adenocarcinoma.  -due to progressive disease, he restarted oxaliplatin and bevacizumab on 06/11/21. He tolerated poorly with a number of side effects, thus oxali was discontinued and beva was held.  He was able to recover. -he restarted bevacizumab on 08/12/21 and Xeloda on 08/13/21. He tolerated well -restaging CT abdomen 11/25/21, showed increase in hepatic metastasis and progression of nodularity along left abdominal wall. -his CEA has also been rising, most recently up to 36.99 on 11/12/21, consistent with cancer progression  -he restarted oxaliplatin on 12/17/21 at lower dose. He has declined 5-fu pump infusion  -[facility] [place] [facility], who recommended against radiation to the abdominal wall and to continue oxaliplatin for several more cycles. -he tells me today he is taking 10068mXeloda now due to excessive skin peeling. Will continue current dose. Labs reviewed, urine protein 100, CMP and CBC WNL, will proceed with treatment. -plan to repeat scan after 4 cycles CAPOX    2. Abdominal hernia with bowel obstruction -he underwent emergent hernia repair as well as small bowel [place] [place] [facility] (results available in Care Everywhere).   3. Comorbidities: Hypothyroidism, BPH, A.fib, CKD stage III -on synthroid and Eliquis -continue f/u with other doctors   4. Left side abdominal pain -Occurs when he stands or walks for 5 to 10 minutes -Likely secondary to chemo deposits in the previous surgical site -He has a palpable hard nodules around the incision -he declined palliative radiation per recommendation by Dr. DeEstelle Grumbles-he accepted prescription for oxycodone when offered by Dr. DeEstelle Grumbleswill continue pain management there.    PLAN: -proceed with cycle 2 Oxaliplatin/bevacizumab today at same dose  -continue Xeloda at same dose 1000 mg twice daily for 14 days -lab, flush, f/u, and oxali/beva on 8/28    No problem-specific Assessment & Plan notes found for this encounter.   SUMMARY OF ONCOLOGIC  HISTORY: Oncology History Overview Note   Cancer Staging  Cancer of left colon [facility]) Staging form: Colon and Rectum, AJCC  8th Edition - Pathologic stage from 01/11/2018: Stage IIIB (pT3, pN1c, cM0) - Signed by [name], MD on 01/16/2018 Total positive nodes: 0 Histologic grading system: 4 grade system Histologic grade (G): G2     Cancer of left colon ([place])  01/10/2018 Imaging   CT AP W Contrast 01/10/18  IMPRESSION: Irregular soft tissue density causing stricture of the mid descending colon likely the site of obstruction for the dilated small bowel. 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 No evidence of perforation.   Equivocal findings involving the appendix measuring 1.2 cm at the appendiceal tip with mucosal enhancement. No adjacent free fluid or inflammatory change. Findings are nonspecific, but can be seen with early acute appendicitis.   Mild prostatic enlargement. Increased density over the posterior bladder base likely due to the large prostatic impression although cannot completely exclude a bladder mass. Urology protocol CT or ultrasound may be helpful for better evaluation.   Mild cholelithiasis.   Stable 1.5 cm cystic structure over the lower pole right kidney likely slightly hyperdense cyst.   Diverticulosis of the colon.   Aortic Atherosclerosis (ICD10-I70.0).   01/11/2018 Cancer Staging   Staging form: Colon and Rectum, AJCC 8th Edition - Pathologic stage from 01/11/2018: Stage IIIB (pT3, pN1c, cM0) - Signed by [name], MD on 01/16/2018   01/11/2018 Surgery   LEFT COLON RESECTION, TAKEDOWN SPLENIC FLEXURE, COLOSTOMY by Dr. [name]    01/11/2018 Procedure   Colonoscopy 01/11/18 by Dr. [name]  - Malignant completely obstructing tumor in the mid descending colon. Tattooed. - Diverticulosis in the sigmoid colon. - Non-bleeding internal hemorrhoids. - No specimens collected.   01/11/2018 Pathology Results   Diagnosis 01/11/18  1. Colon, segmental resection for tumor, descending colon - INVASIVE COLORECTAL ADENOCARCINOMA, 4 CM. - TUMOR EXTENDS INTO PERICOLONIC CONNECTIVE TISSUE. - TUMOR FOCALLY  INVOLVES RADIAL MARGIN. - ONE MESENTERIC TUMOR DEPOSIT. - THIRTEEN BENIGN LYMPH NODES (0/13). 2. Colon, segmental resection, splenic flexure - BENIGN COLON. - NO EVIDENCE OF MALIGNANCY .   01/11/2018 Tumor Marker   Baseline CEA at 3.4   01/16/2018 Initial Diagnosis   Cancer of left colon ([place])   01/23/2018 Imaging   CT CHEST WO CONTRAST IMPRESSION: 1. No evidence for metastatic disease within the chest. 2. Small left pleural effusion with underlying opacities which may represent atelectasis. Right basilar atelectasis. 3. Few foci of gas within the upper abdomen in the omentum with surrounding fat stranding, likely postsurgical 4. Aortic Atherosclerosis (ICD10-I70.0).   03/08/2018 - 05/15/2018 Chemotherapy   adjuvant FOLOFX. Due to side effects of neuropahty Oxaliplatin was stopped after 3 cycles and chemo was stopped after 6 cycles. He declined completing 6 months of chemo treatment.     03/19/2018 Imaging   03/19/2018 CT AP IMPRESSION: 1. Interval partial left hemicolectomy and descending colostomy. 2. Heterogeneous soft tissue density along the left anterior renal fascia is most likely postoperative (favor fat necrosis). No well-defined fluid collection. 3. Mild left lower quadrant edema, new since 01/10/2018. This could be postoperative. Superimposed sigmoid diverticulitis and/or cystitis cannot be excluded. 4. Subtle hyperenhancing nodule within the anterior bladder dome cannot be excluded. Consider nonemergent cystoscopy. When this is performed, recommend attention to the left ureterovesicular junction and distal left ureter to evaluate questionable soft tissue fullness. 5. Cholelithiasis. 6.  Aortic Atherosclerosis (ICD10-I70.0). 7. Prostatomegaly.   11/13/2018 Imaging   CT CAP WO Contrast 11/13/18  IMPRESSION:  1. Reversal of left lower quadrant colostomy with sigmoid colon anastomosis. No complicating features. No findings for residual or recurrent tumor or metastatic  disease involving the chest, abdomen or pelvis without contrast. 2. No acute abdominal/pelvic findings. 3. Gallbladder sludge and gallstones but no findings for acute cholecystitis. 4. Stable anterior abdominal wall hernia. 5. The right testicle is in the right inguinal canal.   10/03/2019 Imaging   CT CAP WO contrast  IMPRESSION: 1. New rounded density interposed between the prostate gland and anterior upper rectal wall could represent adenopathy or local extension of anterior rectal tumor. 2. Marked prostatomegaly, prostate volume 150 cubic cm. 3. Other imaging findings of potential clinical significance: Aortic Atherosclerosis (ICD10-I70.0). Coronary atherosclerosis. Trace right pleural effusion. Suspected cholelithiasis. Nonobstructive left nephrolithiasis. Multilevel lumbar impingement. Bilateral mildly retracted testicles. Hypodense exophytic lesion of the right kidney, most likely to be a cyst.   11/02/2019 Procedure   colonoscopy on 11/02/2019 by Dr. [name] Plan showed normal digital rectal exam, 3 polyps in the rectum, descending colon and cecum, and a prior sigmoid: Anastomosis characterized by erythema.  He found an extrinsic nonobstructing medium-sized mass in the proximal rectum about 4 cm in length, no internal rectal mass.   Diagnosis Surgical [P], colon, cecum, descending, rectal, polyp (3) - TUBULAR ADENOMA (TWO) - NO HIGH GRADE DYSPLASIA OR CARCINOMA. - COLONIC FRAGMENT WITH BENIGN LYMPHOID AGGREGATE.   12/07/2019 Imaging   MRI pelvis IMPRESSION: 1. Masslike area in the rectum suspicious for rectal neoplasm, likely T4b based on the appearance of soft tissue extending along the anterior peritoneal reflection and into the seminal vesicles. Correlation with recent colonoscopy results may be helpful. Area of anastomosis and other areas of the pelvis are not imaged on today's exam.   12/21/2019 PET scan   IMPRESSION: 1. Unfortunately evidence for peritoneal metastasis.  Intensely hypermetabolic nodules along the LEFT pericolic gutter. Favor hypermetabolic mass anterior to the rectum to represent serosal implant along the ventral surface of the rectum. 2. local recurrence within the LEFT abdominal wall at site prior colostomy. Intense hypermetabolic thickening of the rectus muscle at this site. 3. Two hypermetabolic hepatic metastasis.   01/15/2020 Relapse/Recurrence   FINAL MICROSCOPIC DIAGNOSIS:   A. SOFT TISSUE, LEFT ABDOMINAL WALL, BIOPSY:  - Adenocarcinoma.  - See comment.   COMMENT:   The morphology is consistent with metastatic colorectal adenocarcinoma.    01/28/2020 -  Chemotherapy   First line FOLFIRI q2weeks starting 01/28/20 for 8 cycles.  -----Bevacizumab added with C2.  -----Changed to maintenance Xeloda 2000 mg twice daily for 2 weeks on/1 week off and bevacizumab 06/02/20. Starting with C3, dose reduce to 1533m BID due to skin toxicity.   05/05/2020 Imaging   IMPRESSION: 1. Improved appearance, with reduced size of the hepatic metastatic lesions and reduced size of the peritoneal tumor implants. 2. Other imaging findings of potential clinical significance: Notable prostatomegaly. Multilevel impingement in the lumbar spine. Dependent density in the gallbladder possibly from sludge or gallstones. Degenerative glenohumeral arthropathy bilaterally. 3. Aortic atherosclerosis.   08/15/2020 Imaging   CT CAP  IMPRESSION: Chest Impression:   No evidence of thoracic metastasis   Abdomen / Pelvis Impression:   1. Hepatic metastasis are no longer measurable by CT imaging. 2. Peritoneal nodular metastasis in the LEFT abdomen are decreased in size. 3. No evidence of new peritoneal disease. 4. Thickening and LEFT rectus muscle at site of prior metastasis. No interval change. 5. No evidence of new or progressive colorectal carcinoma.     11/27/2020 Imaging  CT CAP  IMPRESSION: 1. There are multiple small pulmonary nodules in the  right upper lobe that are new or enlarged compared to prior examination but measuring 4 mm or smaller, suspicious for pulmonary metastatic disease. 2. Unchanged peritoneal nodule adjacent to the tip of the spleen measuring 1.3 x 1.2 cm. Slightly peritoneal nodule adjacent to the splenic flexure measuring no greater than 6 mm, previously 8 mm. 3. No significant change in previously hypermetabolic soft tissue mass centered about the left lower quadrant colostomy site. 4. Previously established hypermetabolic hepatic metastatic disease remains inapparent by CT. 5. Status post sigmoid colon resection and reanastomosis. 6. Prostatomegaly with thickening of the decompressed urinary bladder, likely secondary to chronic outlet obstruction. 7. Cholelithiasis.   Aortic Atherosclerosis (ICD10-I70.0).   01/29/2021 Imaging   CT CAP   IMPRESSION: 1. Minimal interval progression of bilateral pulmonary nodules, concerning for metastatic disease. 2. No substantial change in size of the peritoneal nodule at the inferior tip of the spleen and splenic flexure nodule. 3. Nodular soft tissue measured at the ostomy site previously is not evident today. 4. Marked prostatomegaly. 5. Cholelithiasis. 6. Aortic Atherosclerosis (ICD10-I70.0).   04/06/2021 Imaging   EXAM: CT ABDOMEN AND PELVIS WITHOUT CONTRAST (Renal Stone Protocol)  IMPRESSION: 1. Bladder wall thickening with surrounding inflammation concerning for cystitis. 2. Partial colectomy. Colon is air-filled with relative transition just proximal to the anastomosis. Stricture or recurrence at this level would be difficult to exclude. No focal mass identified. 3. There is a new subcutaneous fluid collection at the level of prior ostomy in the left abdominal wall. Infection not excluded. 4. There is new intramuscular hyperdensity at the level of the prior ostomy, indeterminate. Findings may related to scarring, tumor recurrence or metastatic  disease is not excluded. 5. Stable nodularity in the left upper quadrant. 6. Cholelithiasis. 7. Trace right pleural effusion. 8. Stable prostatomegaly. 9.  Aortic Atherosclerosis (ICD10-I70.0).   05/05/2021 Pathology Results   FINAL MICROSCOPIC DIAGNOSIS:   A. ABDOMINAL WALL SCAR, EXCISION:  -  Adenocarcinoma  -  See comment   COMMENT:  Morphologically consistent with colonic adenocarcinoma (similar to  previously reported ([id number]).   06/02/2021 Imaging   EXAM: CT CHEST, ABDOMEN, AND PELVIS WITH CONTRAST  IMPRESSION: 1. Progressive multifocal pulmonary metastatic disease. 2. New low-density hepatic lesions, likely metastases. 3. Progressive peritoneal implant near the splenic flexure of the colon. Small peritoneal implant inferior to the spleen appears unchanged. 4. Increased soft tissue nodularity near the midline incision in the upper anterior abdominal wall, suspicious for tumor recurrence at the incision. This could reflect keloid formation. 5. Fluid collection in the left anterior abdominal wall attributed to recent abdominal surgery with associated small incisional hernia containing fat. 6. Additional incidental findings including prostatomegaly, cholelithiasis and Aortic Atherosclerosis (ICD10-I70.0).   06/11/2021 - 11/26/2021 Chemotherapy   Patient is on Treatment Plan : COLORECTALXeloda + Bevacizumab q21d     07/29/2021 Imaging   EXAMINATION: CT ABDOMEN PELVIS WO CONTRAST  Impression  1. Left anterior abdominal wall postsurgical changes with no recurrent hernia.  2. Stable indeterminate 3 cm ventral abdominal wall mass located at the L1-2 level. Question scarring from prior surgery versus neoplasm.  3. Cholelithiasis.  4. Stable indeterminate bibasilar lung nodules. All of with a CT chest is recommended.  5. Indeterminate stable right hepatic lesions. Consider follow-up with an abdominal MRI with and without contrast for additional characterization.      12/17/2021 -  Chemotherapy   Patient is on Treatment Plan : COLORECTAL CapeOx +  Bevacizumab q21d        INTERVAL HISTORY:  Dave Vaughn is here for a follow up of metastatic colon cancer. He was last seen by me on 12/17/21. He presents to the clinic accompanied by his wife. He reports continued pain to his side.   All other systems were reviewed with the patient and are negative.  MEDICAL HISTORY:  Past Medical History:  Diagnosis Date   Adenocarcinoma, colon ([place]) dx'd 01/2018   Anemia    taking iron supplements   Anxiety    Atrial fibrillation with RVR ([facility])    Colonic obstruction ([place]) 01/10/2018   Depression    Diverticulitis    Dysrhythmia    afib   History of kidney stones    Hypertension    Indwelling Foley catheter present    due to UTI    SURGICAL HISTORY: Past Surgical History:  Procedure Laterality Date   ANKLE SURGERY Left    when he was in college   COLON RESECTION N/A 01/11/2018   Procedure: LEFT COLON RESECTION, TAKEDOWN SPLENIC FLEXURE, COLOSTOMY;  Surgeon: [name], MD;  Location: [facility];  Service: General;  Laterality: N/A;   COLONOSCOPY  01/11/2018   Procedure: COLONOSCOPY;  Surgeon: Jackquline Denmark, MD;  Location: [facility];  Service: Endoscopy;;   COLONOSCOPY  05/10/2018   colonscopy  05/10/2018   EXCISION OF KELOID N/A 05/05/2021   Procedure: ABDOMINAL SCAR EXCISION;  Surgeon: Ralene Ok, MD;  Location: [place];  Service: General;  Laterality: N/A;   FOREIGN BODY REMOVAL ABDOMINAL N/A 03/09/2021   Procedure: EXCISION OF FOREIGN BODY;  Surgeon: Ralene Ok, MD;  Location: [place];  Service: General;  Laterality: N/A;   HERNIA REPAIR     HERNIA REPAIR  03/02/2019   EXPLORATORY LAPAROTOMY (N/A Abdomen)   INCISIONAL HERNIA REPAIR N/A 03/02/2019   Procedure: INCISIONAL HERNIA REPAIR , RECTORECTUS VS TAR HERNIA REPAIR;  Surgeon: Ralene Ok, MD;  Location: [place];  Service: General;  Laterality: N/A;   INSERTION OF MESH N/A 03/02/2019    Procedure: Insertion Of Mesh;  Surgeon: Ralene Ok, MD;  Location: [place];  Service: General;  Laterality: N/A;   LAPAROTOMY N/A 03/02/2019   Procedure: EXPLORATORY LAPAROTOMY;  Surgeon: Ralene Ok, MD;  Location: [place];  Service: General;  Laterality: N/A;   LAPAROTOMY N/A 03/09/2021   Procedure: EXPLORATION OF ABDOMINAL WALL;  Surgeon: Ralene Ok, MD;  Location: [place];  Service: General;  Laterality: N/A;   LYSIS OF ADHESION N/A 06/12/2018   Procedure: LYSIS OF ADHESIONS;  Surgeon: [name], MD;  Location: [facility];  Service: General;  Laterality: N/A;   LYSIS OF ADHESION N/A 03/02/2019   Procedure: Lysis Of Adhesion;  Surgeon: Ralene Ok, MD;  Location: [place];  Service: General;  Laterality: N/A;   PORTACATH PLACEMENT Right 03/07/2018   Procedure: INSERTION PORT-A-CATH RIGHT SUBCLAVIAN;  Surgeon: [name], MD;  Location: [place];  Service: General;  Laterality: Right;   PROCTOSCOPY N/A 06/12/2018   Procedure: RIGID PROCTOSCOPY;  Surgeon: [name], MD;  Location: [facility];  Service: General;  Laterality: N/A;   thumb surgery   2018   cyst removal    I have reviewed the social history and family history with the patient and they are unchanged from previous note.  ALLERGIES:  has No Known Allergies.  MEDICATIONS:  Current Outpatient Medications  Medication Sig Dispense Refill   acetaminophen-codeine (TYLENOL #3) 300-30 MG tablet Take 1 tablet by mouth every 8 (eight) hours as  needed for moderate pain. 20 tablet 0   apixaban (ELIQUIS) 5 MG TABS tablet Take 1 tablet (5 mg total) by mouth 2 (two) times daily. 60 tablet 6   b complex vitamins tablet Take 1 tablet by mouth daily.     capecitabine (XELODA) 500 MG tablet TAKE 3 TABLETS BY MOUTH EVERY 12 HOURS AFTER A MEAL FOR 14 DAYS  ON THEN 7 DAYS OFF 84 tablet 1   dexamethasone (DECADRON) 4 MG tablet Take 2 tablets (8 mg total) by mouth daily. Start the day after chemotherapy for 2 days. Take with food. 30 tablet 1    diltiazem (CARDIZEM CD) 360 MG 24 hr capsule TAKE 1 CAPSULE(360 MG) BY MOUTH DAILY 90 capsule 3   finasteride (PROSCAR) 5 MG tablet Take 5 mg by mouth daily.     levothyroxine (SYNTHROID) 75 MCG tablet TAKE 1 TABLET(75 MCG) BY MOUTH DAILY 90 tablet 1   lidocaine-prilocaine (EMLA) cream Apply 1 application topically as needed. 30 g 1   lidocaine-prilocaine (EMLA) cream Apply to affected area once 30 g 3   LORazepam (ATIVAN) 0.5 MG tablet Take 1 tablet (0.5 mg total) by mouth every 6 (six) hours as needed (Nausea or vomiting). 30 tablet 0   Multiple Vitamins-Iron (MULTIVITAMIN/IRON PO) Take 1 tablet by mouth daily.      ondansetron (ZOFRAN) 8 MG tablet Take 1 tablet (8 mg total) by mouth 2 (two) times daily as needed for refractory nausea / vomiting. Start on day 3 after chemotherapy. (Patient not taking: Reported on 12/22/2021) 30 tablet 1   prochlorperazine (COMPAZINE) 10 MG tablet Take 1 tablet (10 mg total) by mouth every 6 (six) hours as needed (Nausea or vomiting). 30 tablet 1   tamsulosin (FLOMAX) 0.4 MG CAPS capsule Take 0.4 mg by mouth 2 (two) times daily.      No current facility-administered medications for this visit.   Facility-Administered Medications Ordered in Other Visits  Medication Dose Route Frequency Provider Last Rate Last Admin   sodium chloride flush (NS) 0.9 % injection 10 mL  10 mL Intracatheter PRN [name], MD   10 mL at 01/14/22 1620    PHYSICAL EXAMINATION: ECOG PERFORMANCE STATUS: 2 - Symptomatic, <50% confined to bed  Vitals:   01/14/22 1234  BP: (!) 158/94  Pulse: 75  Resp: 15  Temp: 98.5 F (36.9 C)  SpO2: 96%   Wt Readings from Last 3 Encounters:  01/14/22 221 lb 9.6 oz (100.5 kg)  12/17/21 221 lb 6.4 oz (100.4 kg)  11/26/21 225 lb 8 oz (102.3 kg)     GENERAL:alert, no distress and comfortable SKIN: skin color normal, no rashes or significant lesions EYES: normal, Conjunctiva are pink and non-injected, sclera clear  NEURO: alert & oriented x  3 with fluent speech  LABORATORY DATA:  I have reviewed the data as listed    Latest Ref Rng & Units 01/14/2022   11:54 AM 12/17/2021   11:22 AM 11/26/2021   12:12 PM  CBC  WBC 4.0 - 10.5 K/uL 6.3  6.5  7.1   Hemoglobin 13.0 - 17.0 g/dL 14.8  15.3  14.7   Hematocrit 39.0 - 52.0 % 42.8  44.4  41.0   Platelets 150 - 400 K/uL 174  172  200         Latest Ref Rng & Units 01/14/2022   11:54 AM 12/17/2021   11:22 AM 11/26/2021   12:12 PM  CMP  Glucose 70 - 99 mg/dL 103  114  93   BUN 8 - 23 mg/dL 25  25  18    Creatinine 0.61 - 1.24 mg/dL 1.03  1.19  1.16   Sodium 135 - 145 mmol/L 138  138  140   Potassium 3.5 - 5.1 mmol/L 4.1  4.2  4.1   Chloride 98 - 111 mmol/L 103  102  106   CO2 22 - 32 mmol/L 31  31  30    Calcium 8.9 - 10.3 mg/dL 9.2  9.5  9.3   Total Protein 6.5 - 8.1 g/dL 7.1  7.3  6.9   Total Bilirubin 0.3 - 1.2 mg/dL 0.8  0.9  0.7   Alkaline Phos 38 - 126 U/L 96  103  89   AST 15 - 41 U/L 20  21  19    ALT 0 - 44 U/L 15  16  13        RADIOGRAPHIC STUDIES: I have personally reviewed the radiological images as listed and agreed with the findings in the report. No results found.    No orders of the defined types were placed in this encounter.  All questions were answered. The patient knows to call the clinic with any problems, questions or concerns. No barriers to learning was detected. The total time spent in the appointment was 30 minutes.     [name], MD 01/14/2022   I, Wilburn Mylar, am acting as scribe for [name], MD.   I have reviewed the above documentation for accuracy and completeness, and I agree with the above.

## 2022-01-14 NOTE — Patient Instructions (Signed)
[facility]  Discharge Instructions: Thank you for choosing [facility] to provide your oncology and hematology care.   If you have a lab appointment with the [facility], please go directly to the [facility] and check in at the registration area.   Wear comfortable clothing and clothing appropriate for easy access to any Portacath or PICC line.   We strive to give you quality time with your provider. You may need to reschedule your appointment if you arrive late (15 or more minutes).  Arriving late affects you and other patients whose appointments are after yours.  Also, if you miss three or more appointments without notifying the office, you may be dismissed from the clinic at the provider's discretion.      For prescription refill requests, have your pharmacy contact our office and allow 72 hours for refills to be completed.    Today you received the following chemotherapy and/or immunotherapy agents: bevacizumab and oxaliplatin      To help prevent nausea and vomiting after your treatment, we encourage you to take your nausea medication as directed.  BELOW ARE SYMPTOMS THAT SHOULD BE REPORTED IMMEDIATELY: *FEVER GREATER THAN 100.4 F (38 C) OR HIGHER *CHILLS OR SWEATING *NAUSEA AND VOMITING THAT IS NOT CONTROLLED WITH YOUR NAUSEA MEDICATION *UNUSUAL SHORTNESS OF BREATH *UNUSUAL BRUISING OR BLEEDING *URINARY PROBLEMS (pain or burning when urinating, or frequent urination) *BOWEL PROBLEMS (unusual diarrhea, constipation, pain near the anus) TENDERNESS IN MOUTH AND THROAT WITH OR WITHOUT PRESENCE OF ULCERS (sore throat, sores in mouth, or a toothache) UNUSUAL RASH, SWELLING OR PAIN  UNUSUAL VAGINAL DISCHARGE OR ITCHING   Items with * indicate a potential emergency and should be followed up as soon as possible or go to the Emergency Department if any problems should occur.  Please show the CHEMOTHERAPY ALERT CARD or IMMUNOTHERAPY ALERT  CARD at check-in to the Emergency Department and triage nurse.  Should you have questions after your visit or need to cancel or reschedule your appointment, please contact [facility]  Dept: [phone]  and follow the prompts.  Office hours are 8:00 a.m. to 4:30 p.m. Monday - Friday. Please note that voicemails left after 4:00 p.m. may not be returned until the following business day.  We are closed weekends and major holidays. You have access to a nurse at all times for urgent questions. Please call the main number to the clinic Dept: [phone] and follow the prompts.   For any non-urgent questions, you may also contact your provider using MyChart. We now offer e-Visits for anyone 66 and older to request care online for non-urgent symptoms. For details visit [email or web address].   Also download the MyChart app! Go to the app store, search "MyChart", open the app, select [facility], and log in with your MyChart username and password.  Masks are optional in the cancer centers. If you would like for your care team to wear a mask while they are taking care of you, please let them know. You may have one support person who is at least 75 years old accompany you for your appointments.

## 2022-01-15 ENCOUNTER — Telehealth: Payer: Self-pay | Admitting: Hematology

## 2022-01-15 ENCOUNTER — Other Ambulatory Visit: Payer: Self-pay

## 2022-01-15 NOTE — Telephone Encounter (Signed)
Left message with follow-up appointments per 8/10 los. 

## 2022-01-16 ENCOUNTER — Other Ambulatory Visit: Payer: Self-pay

## 2022-01-17 ENCOUNTER — Other Ambulatory Visit: Payer: Self-pay

## 2022-01-29 MED FILL — Dexamethasone Sodium Phosphate Inj 100 MG/10ML: INTRAMUSCULAR | Qty: 1 | Status: AC

## 2022-02-01 ENCOUNTER — Inpatient Hospital Stay: Payer: BC Managed Care – PPO

## 2022-02-01 ENCOUNTER — Encounter: Payer: Self-pay | Admitting: Hematology

## 2022-02-01 ENCOUNTER — Other Ambulatory Visit: Payer: Self-pay

## 2022-02-01 ENCOUNTER — Inpatient Hospital Stay: Payer: BC Managed Care – PPO | Admitting: Hematology

## 2022-02-01 VITALS — BP 148/97 | HR 70 | Temp 97.9°F | Resp 17 | Wt 220.5 lb

## 2022-02-01 DIAGNOSIS — Z5111 Encounter for antineoplastic chemotherapy: Secondary | ICD-10-CM | POA: Diagnosis not present

## 2022-02-01 DIAGNOSIS — C186 Malignant neoplasm of descending colon: Secondary | ICD-10-CM

## 2022-02-01 DIAGNOSIS — Z7901 Long term (current) use of anticoagulants: Secondary | ICD-10-CM | POA: Diagnosis not present

## 2022-02-01 DIAGNOSIS — Z5112 Encounter for antineoplastic immunotherapy: Secondary | ICD-10-CM | POA: Diagnosis not present

## 2022-02-01 DIAGNOSIS — I129 Hypertensive chronic kidney disease with stage 1 through stage 4 chronic kidney disease, or unspecified chronic kidney disease: Secondary | ICD-10-CM | POA: Diagnosis not present

## 2022-02-01 DIAGNOSIS — Z79890 Hormone replacement therapy (postmenopausal): Secondary | ICD-10-CM | POA: Diagnosis not present

## 2022-02-01 DIAGNOSIS — I4891 Unspecified atrial fibrillation: Secondary | ICD-10-CM | POA: Diagnosis not present

## 2022-02-01 DIAGNOSIS — C786 Secondary malignant neoplasm of retroperitoneum and peritoneum: Secondary | ICD-10-CM | POA: Diagnosis not present

## 2022-02-01 DIAGNOSIS — Z95828 Presence of other vascular implants and grafts: Secondary | ICD-10-CM

## 2022-02-01 DIAGNOSIS — N183 Chronic kidney disease, stage 3 unspecified: Secondary | ICD-10-CM | POA: Diagnosis not present

## 2022-02-01 DIAGNOSIS — E039 Hypothyroidism, unspecified: Secondary | ICD-10-CM | POA: Diagnosis not present

## 2022-02-01 DIAGNOSIS — C787 Secondary malignant neoplasm of liver and intrahepatic bile duct: Secondary | ICD-10-CM | POA: Diagnosis not present

## 2022-02-01 DIAGNOSIS — N400 Enlarged prostate without lower urinary tract symptoms: Secondary | ICD-10-CM | POA: Diagnosis not present

## 2022-02-01 LAB — CMP (CANCER CENTER ONLY)
ALT: 19 U/L (ref 0–44)
AST: 24 U/L (ref 15–41)
Albumin: 4.2 g/dL (ref 3.5–5.0)
Alkaline Phosphatase: 105 U/L (ref 38–126)
Anion gap: 5 (ref 5–15)
BUN: 17 mg/dL (ref 8–23)
CO2: 30 mmol/L (ref 22–32)
Calcium: 9.4 mg/dL (ref 8.9–10.3)
Chloride: 103 mmol/L (ref 98–111)
Creatinine: 1.07 mg/dL (ref 0.61–1.24)
GFR, Estimated: >60 mL/min (ref >60)
Glucose, Bld: 118 mg/dL — ABNORMAL HIGH (ref 70–99)
Potassium: 4 mmol/L (ref 3.5–5.1)
Sodium: 138 mmol/L (ref 135–145)
Total Bilirubin: 1 mg/dL (ref 0.3–1.2)
Total Protein: 6.9 g/dL (ref 6.5–8.1)

## 2022-02-01 LAB — CBC WITH DIFFERENTIAL (CANCER CENTER ONLY)
Abs Immature Granulocytes: 0.01 10*3/uL (ref 0.00–0.07)
Basophils Absolute: 0 10*3/uL (ref 0.0–0.1)
Basophils Relative: 0 %
Eosinophils Absolute: 0.1 10*3/uL (ref 0.0–0.5)
Eosinophils Relative: 1 %
HCT: 41.8 % (ref 39.0–52.0)
Hemoglobin: 14.7 g/dL (ref 13.0–17.0)
Immature Granulocytes: 0 %
Lymphocytes Relative: 27 %
Lymphs Abs: 1.6 10*3/uL (ref 0.7–4.0)
MCH: 32.9 pg (ref 26.0–34.0)
MCHC: 35.2 g/dL (ref 30.0–36.0)
MCV: 93.5 fL (ref 80.0–100.0)
Monocytes Absolute: 0.6 10*3/uL (ref 0.1–1.0)
Monocytes Relative: 9 %
Neutro Abs: 3.8 10*3/uL (ref 1.7–7.7)
Neutrophils Relative %: 63 %
Platelet Count: 185 10*3/uL (ref 150–400)
RBC: 4.47 MIL/uL (ref 4.22–5.81)
RDW: 14.4 % (ref 11.5–15.5)
WBC Count: 6.1 10*3/uL (ref 4.0–10.5)
nRBC: 0 % (ref 0.0–0.2)

## 2022-02-01 LAB — TOTAL PROTEIN, URINE DIPSTICK: Protein, ur: 30 mg/dL — AB

## 2022-02-01 LAB — CEA (IN HOUSE-CHCC): CEA (CHCC-In House): 34.18 ng/mL — ABNORMAL HIGH (ref 0.00–5.00)

## 2022-02-01 MED ORDER — SODIUM CHLORIDE 0.9 % IV SOLN: Freq: Once | INTRAVENOUS | Status: AC

## 2022-02-01 MED ORDER — DEXTROSE 5 % IV SOLN: Freq: Once | INTRAVENOUS | Status: AC

## 2022-02-01 MED ORDER — SODIUM CHLORIDE 0.9 % IV SOLN
7.5000 mg/kg | Freq: Once | INTRAVENOUS | Status: AC
  Administered 2022-02-01: 800 mg via INTRAVENOUS
  Filled 2022-02-01: qty 32

## 2022-02-01 MED ORDER — SODIUM CHLORIDE 0.9% FLUSH
10.0000 mL | INTRAVENOUS | Status: DC | PRN
  Administered 2022-02-01: 10 mL

## 2022-02-01 MED ORDER — SODIUM CHLORIDE 0.9 % IV SOLN
10.0000 mg | Freq: Once | INTRAVENOUS | Status: AC
  Administered 2022-02-01: 10 mg via INTRAVENOUS
  Filled 2022-02-01: qty 10

## 2022-02-01 MED ORDER — OXALIPLATIN CHEMO INJECTION 100 MG/20ML
60.0000 mg/m2 | Freq: Once | INTRAVENOUS | Status: AC
  Administered 2022-02-01: 135 mg via INTRAVENOUS
  Filled 2022-02-01: qty 27

## 2022-02-01 MED ORDER — PALONOSETRON HCL INJECTION 0.25 MG/5ML
0.2500 mg | Freq: Once | INTRAVENOUS | Status: AC
  Administered 2022-02-01: 0.25 mg via INTRAVENOUS
  Filled 2022-02-01: qty 5

## 2022-02-01 MED ORDER — HEPARIN SOD (PORK) LOCK FLUSH 100 UNIT/ML IV SOLN
500.0000 [IU] | Freq: Once | INTRAVENOUS | Status: AC | PRN
  Administered 2022-02-01: 500 [IU]

## 2022-02-01 NOTE — Progress Notes (Signed)
Per Dr. Burr Medico, patient is not willing to increase his oxaliplatin to 70 mg/m2 therefore will continue 60 mg /m2 moving forward.  Larene Beach, PharmD

## 2022-02-01 NOTE — Patient Instructions (Signed)
[facility]  Discharge Instructions: Thank you for choosing [facility] to provide your oncology and hematology care.   If you have a lab appointment with the [facility], please go directly to the [facility] and check in at the registration area.   Wear comfortable clothing and clothing appropriate for easy access to any Portacath or PICC line.   We strive to give you quality time with your provider. You may need to reschedule your appointment if you arrive late (15 or more minutes).  Arriving late affects you and other patients whose appointments are after yours.  Also, if you miss three or more appointments without notifying the office, you may be dismissed from the clinic at the provider's discretion.      For prescription refill requests, have your pharmacy contact our office and allow 72 hours for refills to be completed.    Today you received the following chemotherapy and/or immunotherapy agents: zirabev, oxaliplatin      To help prevent nausea and vomiting after your treatment, we encourage you to take your nausea medication as directed.  BELOW ARE SYMPTOMS THAT SHOULD BE REPORTED IMMEDIATELY: *FEVER GREATER THAN 100.4 F (38 C) OR HIGHER *CHILLS OR SWEATING *NAUSEA AND VOMITING THAT IS NOT CONTROLLED WITH YOUR NAUSEA MEDICATION *UNUSUAL SHORTNESS OF BREATH *UNUSUAL BRUISING OR BLEEDING *URINARY PROBLEMS (pain or burning when urinating, or frequent urination) *BOWEL PROBLEMS (unusual diarrhea, constipation, pain near the anus) TENDERNESS IN MOUTH AND THROAT WITH OR WITHOUT PRESENCE OF ULCERS (sore throat, sores in mouth, or a toothache) UNUSUAL RASH, SWELLING OR PAIN  UNUSUAL VAGINAL DISCHARGE OR ITCHING   Items with * indicate a potential emergency and should be followed up as soon as possible or go to the Emergency Department if any problems should occur.  Please show the CHEMOTHERAPY ALERT CARD or IMMUNOTHERAPY ALERT CARD at  check-in to the Emergency Department and triage nurse.  Should you have questions after your visit or need to cancel or reschedule your appointment, please contact [facility]  Dept: [phone]  and follow the prompts.  Office hours are 8:00 a.m. to 4:30 p.m. Monday - Friday. Please note that voicemails left after 4:00 p.m. may not be returned until the following business day.  We are closed weekends and major holidays. You have access to a nurse at all times for urgent questions. Please call the main number to the clinic Dept: [phone] and follow the prompts.   For any non-urgent questions, you may also contact your provider using MyChart. We now offer e-Visits for anyone 67 and older to request care online for non-urgent symptoms. For details visit [email or web address].   Also download the MyChart app! Go to the app store, search "MyChart", open the app, select [facility], and log in with your MyChart username and password.  Masks are optional in the cancer centers. If you would like for your care team to wear a mask while they are taking care of you, please let them know. You may have one support person who is at least 75 years old accompany you for your appointments.

## 2022-02-01 NOTE — Progress Notes (Signed)
Dave Vaughn   [phone] [phone]   Clinic Follow up Note   Patient Care Team: [name], MD (Inactive) as PCP - General (Family Medicine) [name], MD as Consulting Physician (Cardiology) [name], MD as Consulting Physician (Gastroenterology) [name], MD as Consulting Physician (General Surgery) [name], MD as Consulting Physician (General Surgery) [name], MD as Consulting Physician (Urology) [name], MD as Consulting Physician (Medical Oncology)  Date of Service:  02/01/2022  CHIEF COMPLAINT: f/u of metastatic colon cancer  CURRENT THERAPY:  -Bevacizumab/Xeloda restarted 08/12/21             -Xeloda dose: 1039m BID, days 1-14 q21days -Oxaliplatin restarted 12/17/21  ASSESSMENT & PLAN:  Dave VANBERGENis a 76y.o. male with   1. Cancer of left colon, adenocarcinoma, stage IIIB (pT3N1cM0), MSI-stable, KRAS Q61H mutation (+), liver and peritoneal metastasis in 12/2019  -Diagnosed in 01/2018. S/p surgery and adjuvant FOLFOX. He did not tolerate well and declined additional treatment at that time. -restaging in 12/2019 revealed local recurrence in left abdominal wall and peritoneal metastasis, 2 hypermetabolic liver mets. Abdominal wall biopsy confirmed metastatic colorectal adenocarcinoma in 01/2020  -FO showed KRAS mutation (+), he is not a candidate for EGFR inhibitor.   -received 8 cycles FOLFIRI 01/28/20 - 05/14/20, Bevacizumab added with cycle 2. Tolerated well with fatigue, nausea, diarrhea. CEA normalized after cycle 4 -switched to maintenance xeloda and beva 06/02/20. Beva was held 01/13/21 - 06/10/21 due to high proteinuria -abdominal scar excision on 05/05/21 under Dr. RRosendo Gros Pathology revealed adenocarcinoma.  -due to progressive disease, he restarted oxaliplatin and bevacizumab on 06/11/21. He tolerated poorly with a number of side effects, thus oxali was discontinued and beva was held.  He was able to recover. -he restarted bevacizumab on 08/12/21 and Xeloda on 08/13/21. He tolerated well -restaging CT abdomen 11/25/21, showed increase in hepatic metastasis and progression of nodularity along left abdominal wall. -his CEA has also been rising, most recently up to 36.99 on 11/12/21, consistent with cancer progression  -he restarted oxaliplatin on 12/17/21 at lower dose. He has declined 5-fu pump infusion  -[facility], who recommended against radiation to the abdominal wall and to continue oxaliplatin for several more cycles. -he is tolerating well at reduced dose. Labs reviewed, urine protein 30, CMP and CBC WNL, will proceed with C3. Given his stable tumor marker CEA, I recommend to increase oxaliplatin dose for better disease control, he declined.  -plan to repeat scan after 4 cycles CAPOX at same dose  2. Left side abdominal pain -Occurs when he stands or walks for 5 to 10 minutes -Likely secondary to chemo deposits in the previous surgical site -He has a palpable hard nodules around the incision -he declined palliative radiation per recommendation by Dr. DEstelle Grumbles -he accepted prescription for oxycodone when offered by Dr. DEstelle Grumbles will continue pain management there.  3. Abdominal hernia with bowel obstruction -he underwent emergent hernia repair as well as small bowel resection [place]16[place] [facility] [place] [facility] (results available in Care Everywhere).     PLAN: -proceed with cycle 3 Oxaliplatin/bevacizumab today at same dose  -continue Xeloda at same dose 1000 mg twice daily for 14 days -lab, flush, f/u, and oxali/beva on 9/20 as scheduled, will order restaging CT on next visit    No problem-specific Assessment & Plan notes found for this encounter.   SUMMARY OF ONCOLOGIC HISTORY: Oncology  History Overview Note   Cancer Staging  Cancer of left colon [facility]) Staging form: Colon and Rectum, AJCC 8th Edition -  Pathologic stage from 01/11/2018: Stage IIIB (pT3, pN1c, cM0) - Signed by [name], MD on 01/16/2018 Total positive nodes: 0 Histologic grading system: 4 grade system Histologic grade (G): G2     Cancer of left colon ([place])  01/10/2018 Imaging   CT AP W Contrast 01/10/18  IMPRESSION: Irregular soft tissue density causing stricture of the mid descending colon likely the site of obstruction for the dilated small bowel. 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 No evidence of perforation.   Equivocal findings involving the appendix measuring 1.2 cm at the appendiceal tip with mucosal enhancement. No adjacent free fluid or inflammatory change. Findings are nonspecific, but can be seen with early acute appendicitis.   Mild prostatic enlargement. Increased density over the posterior bladder base likely due to the large prostatic impression although cannot completely exclude a bladder mass. Urology protocol CT or ultrasound may be helpful for better evaluation.   Mild cholelithiasis.   Stable 1.5 cm cystic structure over the lower pole right kidney likely slightly hyperdense cyst.   Diverticulosis of the colon.   Aortic Atherosclerosis (ICD10-I70.0).   01/11/2018 Cancer Staging   Staging form: Colon and Rectum, AJCC 8th Edition - Pathologic stage from 01/11/2018: Stage IIIB (pT3, pN1c, cM0) - Signed by [name], MD on 01/16/2018   01/11/2018 Surgery   LEFT COLON RESECTION, TAKEDOWN SPLENIC FLEXURE, COLOSTOMY by Dr. [name]    01/11/2018 Procedure   Colonoscopy 01/11/18 by Dr. [name]  - Malignant completely obstructing tumor in the mid descending colon. Tattooed. - Diverticulosis in the sigmoid colon. - Non-bleeding internal hemorrhoids. - No specimens collected.   01/11/2018 Pathology Results   Diagnosis 01/11/18  1. Colon, segmental resection for tumor, descending colon - INVASIVE COLORECTAL ADENOCARCINOMA, 4 CM. - TUMOR EXTENDS INTO PERICOLONIC CONNECTIVE TISSUE. - TUMOR FOCALLY INVOLVES RADIAL  MARGIN. - ONE MESENTERIC TUMOR DEPOSIT. - THIRTEEN BENIGN LYMPH NODES (0/13). 2. Colon, segmental resection, splenic flexure - BENIGN COLON. - NO EVIDENCE OF MALIGNANCY .   01/11/2018 Tumor Marker   Baseline CEA at 3.4   01/16/2018 Initial Diagnosis   Cancer of left colon ([place])   01/23/2018 Imaging   CT CHEST WO CONTRAST IMPRESSION: 1. No evidence for metastatic disease within the chest. 2. Small left pleural effusion with underlying opacities which may represent atelectasis. Right basilar atelectasis. 3. Few foci of gas within the upper abdomen in the omentum with surrounding fat stranding, likely postsurgical 4. Aortic Atherosclerosis (ICD10-I70.0).   03/08/2018 - 05/15/2018 Chemotherapy   adjuvant FOLOFX. Due to side effects of neuropahty Oxaliplatin was stopped after 3 cycles and chemo was stopped after 6 cycles. He declined completing 6 months of chemo treatment.     03/19/2018 Imaging   03/19/2018 CT AP IMPRESSION: 1. Interval partial left hemicolectomy and descending colostomy. 2. Heterogeneous soft tissue density along the left anterior renal fascia is most likely postoperative (favor fat necrosis). No well-defined fluid collection. 3. Mild left lower quadrant edema, new since 01/10/2018. This could be postoperative. Superimposed sigmoid diverticulitis and/or cystitis cannot be excluded. 4. Subtle hyperenhancing nodule within the anterior bladder dome cannot be excluded. Consider nonemergent cystoscopy. When this is performed, recommend attention to the left ureterovesicular junction and distal left ureter to evaluate questionable soft tissue fullness. 5. Cholelithiasis. 6.  Aortic Atherosclerosis (ICD10-I70.0). 7. Prostatomegaly.   11/13/2018 Imaging   CT CAP WO Contrast 11/13/18  IMPRESSION: 1. Reversal  of left lower quadrant colostomy with sigmoid colon anastomosis. No complicating features. No findings for residual or recurrent tumor or metastatic disease  involving the chest, abdomen or pelvis without contrast. 2. No acute abdominal/pelvic findings. 3. Gallbladder sludge and gallstones but no findings for acute cholecystitis. 4. Stable anterior abdominal wall hernia. 5. The right testicle is in the right inguinal canal.   10/03/2019 Imaging   CT CAP WO contrast  IMPRESSION: 1. New rounded density interposed between the prostate gland and anterior upper rectal wall could represent adenopathy or local extension of anterior rectal tumor. 2. Marked prostatomegaly, prostate volume 150 cubic cm. 3. Other imaging findings of potential clinical significance: Aortic Atherosclerosis (ICD10-I70.0). Coronary atherosclerosis. Trace right pleural effusion. Suspected cholelithiasis. Nonobstructive left nephrolithiasis. Multilevel lumbar impingement. Bilateral mildly retracted testicles. Hypodense exophytic lesion of the right kidney, most likely to be a cyst.   11/02/2019 Procedure   colonoscopy on 11/02/2019 by Dr. [name] Plan showed normal digital rectal exam, 3 polyps in the rectum, descending colon and cecum, and a prior sigmoid: Anastomosis characterized by erythema.  He found an extrinsic nonobstructing medium-sized mass in the proximal rectum about 4 cm in length, no internal rectal mass.   Diagnosis Surgical [P], colon, cecum, descending, rectal, polyp (3) - TUBULAR ADENOMA (TWO) - NO HIGH GRADE DYSPLASIA OR CARCINOMA. - COLONIC FRAGMENT WITH BENIGN LYMPHOID AGGREGATE.   12/07/2019 Imaging   MRI pelvis IMPRESSION: 1. Masslike area in the rectum suspicious for rectal neoplasm, likely T4b based on the appearance of soft tissue extending along the anterior peritoneal reflection and into the seminal vesicles. Correlation with recent colonoscopy results may be helpful. Area of anastomosis and other areas of the pelvis are not imaged on today's exam.   12/21/2019 PET scan   IMPRESSION: 1. Unfortunately evidence for peritoneal metastasis.  Intensely hypermetabolic nodules along the LEFT pericolic gutter. Favor hypermetabolic mass anterior to the rectum to represent serosal implant along the ventral surface of the rectum. 2. local recurrence within the LEFT abdominal wall at site prior colostomy. Intense hypermetabolic thickening of the rectus muscle at this site. 3. Two hypermetabolic hepatic metastasis.   01/15/2020 Relapse/Recurrence   FINAL MICROSCOPIC DIAGNOSIS:   A. SOFT TISSUE, LEFT ABDOMINAL WALL, BIOPSY:  - Adenocarcinoma.  - See comment.   COMMENT:   The morphology is consistent with metastatic colorectal adenocarcinoma.    01/28/2020 -  Chemotherapy   First line FOLFIRI q2weeks starting 01/28/20 for 8 cycles.  -----Bevacizumab added with C2.  -----Changed to maintenance Xeloda 2000 mg twice daily for 2 weeks on/1 week off and bevacizumab 06/02/20. Starting with C3, dose reduce to 1558m BID due to skin toxicity.   05/05/2020 Imaging   IMPRESSION: 1. Improved appearance, with reduced size of the hepatic metastatic lesions and reduced size of the peritoneal tumor implants. 2. Other imaging findings of potential clinical significance: Notable prostatomegaly. Multilevel impingement in the lumbar spine. Dependent density in the gallbladder possibly from sludge or gallstones. Degenerative glenohumeral arthropathy bilaterally. 3. Aortic atherosclerosis.   08/15/2020 Imaging   CT CAP  IMPRESSION: Chest Impression:   No evidence of thoracic metastasis   Abdomen / Pelvis Impression:   1. Hepatic metastasis are no longer measurable by CT imaging. 2. Peritoneal nodular metastasis in the LEFT abdomen are decreased in size. 3. No evidence of new peritoneal disease. 4. Thickening and LEFT rectus muscle at site of prior metastasis. No interval change. 5. No evidence of new or progressive colorectal carcinoma.     11/27/2020 Imaging  CT CAP  IMPRESSION: 1. There are multiple small pulmonary nodules in the  right upper lobe that are new or enlarged compared to prior examination but measuring 4 mm or smaller, suspicious for pulmonary metastatic disease. 2. Unchanged peritoneal nodule adjacent to the tip of the spleen measuring 1.3 x 1.2 cm. Slightly peritoneal nodule adjacent to the splenic flexure measuring no greater than 6 mm, previously 8 mm. 3. No significant change in previously hypermetabolic soft tissue mass centered about the left lower quadrant colostomy site. 4. Previously established hypermetabolic hepatic metastatic disease remains inapparent by CT. 5. Status post sigmoid colon resection and reanastomosis. 6. Prostatomegaly with thickening of the decompressed urinary bladder, likely secondary to chronic outlet obstruction. 7. Cholelithiasis.   Aortic Atherosclerosis (ICD10-I70.0).   01/29/2021 Imaging   CT CAP   IMPRESSION: 1. Minimal interval progression of bilateral pulmonary nodules, concerning for metastatic disease. 2. No substantial change in size of the peritoneal nodule at the inferior tip of the spleen and splenic flexure nodule. 3. Nodular soft tissue measured at the ostomy site previously is not evident today. 4. Marked prostatomegaly. 5. Cholelithiasis. 6. Aortic Atherosclerosis (ICD10-I70.0).   04/06/2021 Imaging   EXAM: CT ABDOMEN AND PELVIS WITHOUT CONTRAST (Renal Stone Protocol)  IMPRESSION: 1. Bladder wall thickening with surrounding inflammation concerning for cystitis. 2. Partial colectomy. Colon is air-filled with relative transition just proximal to the anastomosis. Stricture or recurrence at this level would be difficult to exclude. No focal mass identified. 3. There is a new subcutaneous fluid collection at the level of prior ostomy in the left abdominal wall. Infection not excluded. 4. There is new intramuscular hyperdensity at the level of the prior ostomy, indeterminate. Findings may related to scarring, tumor recurrence or metastatic  disease is not excluded. 5. Stable nodularity in the left upper quadrant. 6. Cholelithiasis. 7. Trace right pleural effusion. 8. Stable prostatomegaly. 9.  Aortic Atherosclerosis (ICD10-I70.0).   05/05/2021 Pathology Results   FINAL MICROSCOPIC DIAGNOSIS:   A. ABDOMINAL WALL SCAR, EXCISION:  -  Adenocarcinoma  -  See comment   COMMENT:  Morphologically consistent with colonic adenocarcinoma (similar to  previously reported ([id number]).   06/02/2021 Imaging   EXAM: CT CHEST, ABDOMEN, AND PELVIS WITH CONTRAST  IMPRESSION: 1. Progressive multifocal pulmonary metastatic disease. 2. New low-density hepatic lesions, likely metastases. 3. Progressive peritoneal implant near the splenic flexure of the colon. Small peritoneal implant inferior to the spleen appears unchanged. 4. Increased soft tissue nodularity near the midline incision in the upper anterior abdominal wall, suspicious for tumor recurrence at the incision. This could reflect keloid formation. 5. Fluid collection in the left anterior abdominal wall attributed to recent abdominal surgery with associated small incisional hernia containing fat. 6. Additional incidental findings including prostatomegaly, cholelithiasis and Aortic Atherosclerosis (ICD10-I70.0).   06/11/2021 - 11/26/2021 Chemotherapy   Patient is on Treatment Plan : COLORECTALXeloda + Bevacizumab q21d     07/29/2021 Imaging   EXAMINATION: CT ABDOMEN PELVIS WO CONTRAST  Impression  1. Left anterior abdominal wall postsurgical changes with no recurrent hernia.  2. Stable indeterminate 3 cm ventral abdominal wall mass located at the L1-2 level. Question scarring from prior surgery versus neoplasm.  3. Cholelithiasis.  4. Stable indeterminate bibasilar lung nodules. All of with a CT chest is recommended.  5. Indeterminate stable right hepatic lesions. Consider follow-up with an abdominal MRI with and without contrast for additional characterization.      12/17/2021 - 02/01/2022 Chemotherapy   Patient is on Treatment Plan : COLORECTAL CapeOx +  Bevacizumab q21d     12/17/2021 -  Chemotherapy   Patient is on Treatment Plan : COLORECTAL CapeOx + Bevacizumab q21d        INTERVAL HISTORY:  Dave Vaughn is here for a follow up of metastatic colon cancer. He was last seen by me on 01/14/22. He was seen in the infusion area. He reports he had cold sensitivity that lasted for about a week. He denies numbness or tingling. He reports he also had fatigue but continued to work.   All other systems were reviewed with the patient and are negative.  MEDICAL HISTORY:  Past Medical History:  Diagnosis Date   Adenocarcinoma, colon ([place]) dx'd 01/2018   Anemia    taking iron supplements   Anxiety    Atrial fibrillation with RVR ([facility])    Colonic obstruction ([place]) 01/10/2018   Depression    Diverticulitis    Dysrhythmia    afib   History of kidney stones    Hypertension    Indwelling Foley catheter present    due to UTI    SURGICAL HISTORY: Past Surgical History:  Procedure Laterality Date   ANKLE SURGERY Left    when he was in college   COLON RESECTION N/A 01/11/2018   Procedure: LEFT COLON RESECTION, TAKEDOWN SPLENIC FLEXURE, COLOSTOMY;  Surgeon: [name], MD;  Location: [facility];  Service: General;  Laterality: N/A;   COLONOSCOPY  01/11/2018   Procedure: COLONOSCOPY;  Surgeon: Jackquline Denmark, MD;  Location: [facility];  Service: Endoscopy;;   COLONOSCOPY  05/10/2018   colonscopy  05/10/2018   EXCISION OF KELOID N/A 05/05/2021   Procedure: ABDOMINAL SCAR EXCISION;  Surgeon: Ralene Ok, MD;  Location: [place];  Service: General;  Laterality: N/A;   FOREIGN BODY REMOVAL ABDOMINAL N/A 03/09/2021   Procedure: EXCISION OF FOREIGN BODY;  Surgeon: Ralene Ok, MD;  Location: [place];  Service: General;  Laterality: N/A;   HERNIA REPAIR     HERNIA REPAIR  03/02/2019   EXPLORATORY LAPAROTOMY (N/A Abdomen)   INCISIONAL HERNIA REPAIR N/A  03/02/2019   Procedure: INCISIONAL HERNIA REPAIR , RECTORECTUS VS TAR HERNIA REPAIR;  Surgeon: Ralene Ok, MD;  Location: [place];  Service: General;  Laterality: N/A;   INSERTION OF MESH N/A 03/02/2019   Procedure: Insertion Of Mesh;  Surgeon: Ralene Ok, MD;  Location: [place];  Service: General;  Laterality: N/A;   LAPAROTOMY N/A 03/02/2019   Procedure: EXPLORATORY LAPAROTOMY;  Surgeon: Ralene Ok, MD;  Location: [place];  Service: General;  Laterality: N/A;   LAPAROTOMY N/A 03/09/2021   Procedure: EXPLORATION OF ABDOMINAL WALL;  Surgeon: Ralene Ok, MD;  Location: [place];  Service: General;  Laterality: N/A;   LYSIS OF ADHESION N/A 06/12/2018   Procedure: LYSIS OF ADHESIONS;  Surgeon: [name], MD;  Location: [facility];  Service: General;  Laterality: N/A;   LYSIS OF ADHESION N/A 03/02/2019   Procedure: Lysis Of Adhesion;  Surgeon: Ralene Ok, MD;  Location: [place];  Service: General;  Laterality: N/A;   PORTACATH PLACEMENT Right 03/07/2018   Procedure: INSERTION PORT-A-CATH RIGHT SUBCLAVIAN;  Surgeon: [name], MD;  Location: [place];  Service: General;  Laterality: Right;   PROCTOSCOPY N/A 06/12/2018   Procedure: RIGID PROCTOSCOPY;  Surgeon: [name], MD;  Location: [facility];  Service: General;  Laterality: N/A;   thumb surgery   2018   cyst removal    I have reviewed the social history and family history with the patient and they are unchanged  from previous note.  ALLERGIES:  has No Known Allergies.  MEDICATIONS:  Current Outpatient Medications  Medication Sig Dispense Refill   acetaminophen-codeine (TYLENOL #3) 300-30 MG tablet Take 1 tablet by mouth every 8 (eight) hours as needed for moderate pain. 20 tablet 0   apixaban (ELIQUIS) 5 MG TABS tablet Take 1 tablet (5 mg total) by mouth 2 (two) times daily. 60 tablet 6   b complex vitamins tablet Take 1 tablet by mouth daily.     capecitabine (XELODA) 500 MG tablet TAKE 3 TABLETS BY MOUTH EVERY 12 HOURS AFTER  A MEAL FOR 14 DAYS  ON THEN 7 DAYS OFF 84 tablet 1   diltiazem (CARDIZEM CD) 360 MG 24 hr capsule TAKE 1 CAPSULE(360 MG) BY MOUTH DAILY 90 capsule 3   finasteride (PROSCAR) 5 MG tablet Take 5 mg by mouth daily.     levothyroxine (SYNTHROID) 75 MCG tablet TAKE 1 TABLET(75 MCG) BY MOUTH DAILY 90 tablet 1   lidocaine-prilocaine (EMLA) cream Apply 1 application topically as needed. 30 g 1   Multiple Vitamins-Iron (MULTIVITAMIN/IRON PO) Take 1 tablet by mouth daily.      tamsulosin (FLOMAX) 0.4 MG CAPS capsule Take 0.4 mg by mouth 2 (two) times daily.      No current facility-administered medications for this visit.    PHYSICAL EXAMINATION: ECOG PERFORMANCE STATUS: 2 - Symptomatic, <50% confined to bed  There were no vitals filed for this visit. Wt Readings from Last 3 Encounters:  02/01/22 220 lb 8 oz (100 kg)  01/14/22 221 lb 9.6 oz (100.5 kg)  12/17/21 221 lb 6.4 oz (100.4 kg)     GENERAL:alert, no distress and comfortable SKIN: skin color normal, no rashes or significant lesions EYES: normal, Conjunctiva are pink and non-injected, sclera clear  NEURO: alert & oriented x 3 with fluent speech  LABORATORY DATA:  I have reviewed the data as listed    Latest Ref Rng & Units 02/01/2022   11:21 AM 01/14/2022   11:54 AM 12/17/2021   11:22 AM  CBC  WBC 4.0 - 10.5 K/uL 6.1  6.3  6.5   Hemoglobin 13.0 - 17.0 g/dL 14.7  14.8  15.3   Hematocrit 39.0 - 52.0 % 41.8  42.8  44.4   Platelets 150 - 400 K/uL 185  174  172         Latest Ref Rng & Units 02/01/2022   11:21 AM 01/14/2022   11:54 AM 12/17/2021   11:22 AM  CMP  Glucose 70 - 99 mg/dL 118  103  114   BUN 8 - 23 mg/dL _0 Creatinine 0.61 - 1.24 mg/dL 1.07  1.03  1.19   Sodium 135 - 145 mmol/L 138  138  138   Potassium 3.5 - 5.1 mmol/L 4.0  4.1  4.2   Chloride 98 - 111 mmol/L 103  103  102   CO2 22 - 32 mmol/L _1 Calcium 8.9 - 10.3 mg/dL 9.4  9.2  9.5   Total Protein 6.5 - 8.1 g/dL 6.9  7.1  7.3   Total  Bilirubin 0.3 - 1.2 mg/dL 1.0  0.8  0.9   Alkaline Phos 38 - 126 U/L 105  96  103   AST 15 - 41 U/L _2 ALT 0 - 44 U/L _3 RADIOGRAPHIC STUDIES: I have personally reviewed the  radiological images as listed and agreed with the findings in the report. No results found.    Orders Placed This Encounter  Procedures   CBC with Differential ([place] Only)    Standing Status:   Future    Standing Expiration Date:   02/25/2023   CMP ([place] only)    Standing Status:   Future    Standing Expiration Date:   02/25/2023   CBC with Differential ([place] Only)    Standing Status:   Future    Standing Expiration Date:   03/18/2023   CMP ([place] only)    Standing Status:   Future    Standing Expiration Date:   03/18/2023   All questions were answered. The patient knows to call the clinic with any problems, questions or concerns. No barriers to learning was detected.      [name], MD 02/01/2022   I, Wilburn Mylar, am acting as scribe for [name], MD.   I have reviewed the above documentation for accuracy and completeness, and I agree with the above.

## 2022-02-16 ENCOUNTER — Other Ambulatory Visit: Payer: Self-pay

## 2022-02-17 ENCOUNTER — Encounter: Payer: BC Managed Care – PPO | Admitting: Family Medicine

## 2022-02-18 ENCOUNTER — Other Ambulatory Visit: Payer: Self-pay

## 2022-02-23 ENCOUNTER — Other Ambulatory Visit: Payer: Self-pay

## 2022-02-23 MED FILL — Dexamethasone Sodium Phosphate Inj 100 MG/10ML: INTRAMUSCULAR | Qty: 1 | Status: AC

## 2022-02-24 ENCOUNTER — Inpatient Hospital Stay: Payer: BC Managed Care – PPO | Attending: Hematology

## 2022-02-24 ENCOUNTER — Inpatient Hospital Stay: Payer: BC Managed Care – PPO

## 2022-02-24 ENCOUNTER — Inpatient Hospital Stay: Payer: BC Managed Care – PPO | Admitting: Hematology

## 2022-02-24 ENCOUNTER — Other Ambulatory Visit: Payer: Self-pay

## 2022-02-24 ENCOUNTER — Encounter: Payer: Self-pay | Admitting: Hematology

## 2022-02-24 VITALS — BP 149/92 | HR 77 | Temp 98.2°F | Resp 14 | Wt 222.4 lb

## 2022-02-24 DIAGNOSIS — C786 Secondary malignant neoplasm of retroperitoneum and peritoneum: Secondary | ICD-10-CM | POA: Diagnosis not present

## 2022-02-24 DIAGNOSIS — Z95828 Presence of other vascular implants and grafts: Secondary | ICD-10-CM

## 2022-02-24 DIAGNOSIS — Z5111 Encounter for antineoplastic chemotherapy: Secondary | ICD-10-CM | POA: Diagnosis not present

## 2022-02-24 DIAGNOSIS — C186 Malignant neoplasm of descending colon: Secondary | ICD-10-CM | POA: Insufficient documentation

## 2022-02-24 DIAGNOSIS — C787 Secondary malignant neoplasm of liver and intrahepatic bile duct: Secondary | ICD-10-CM | POA: Diagnosis not present

## 2022-02-24 DIAGNOSIS — N400 Enlarged prostate without lower urinary tract symptoms: Secondary | ICD-10-CM | POA: Insufficient documentation

## 2022-02-24 DIAGNOSIS — Z5112 Encounter for antineoplastic immunotherapy: Secondary | ICD-10-CM | POA: Diagnosis not present

## 2022-02-24 LAB — CBC WITH DIFFERENTIAL (CANCER CENTER ONLY)
Abs Immature Granulocytes: 0.02 10*3/uL (ref 0.00–0.07)
Basophils Absolute: 0 10*3/uL (ref 0.0–0.1)
Basophils Relative: 0 %
Eosinophils Absolute: 0.1 10*3/uL (ref 0.0–0.5)
Eosinophils Relative: 1 %
HCT: 40.3 % (ref 39.0–52.0)
Hemoglobin: 14 g/dL (ref 13.0–17.0)
Immature Granulocytes: 0 %
Lymphocytes Relative: 18 %
Lymphs Abs: 1.7 10*3/uL (ref 0.7–4.0)
MCH: 32.9 pg (ref 26.0–34.0)
MCHC: 34.7 g/dL (ref 30.0–36.0)
MCV: 94.8 fL (ref 80.0–100.0)
Monocytes Absolute: 0.8 10*3/uL (ref 0.1–1.0)
Monocytes Relative: 9 %
Neutro Abs: 6.4 10*3/uL (ref 1.7–7.7)
Neutrophils Relative %: 72 %
Platelet Count: 189 10*3/uL (ref 150–400)
RBC: 4.25 MIL/uL (ref 4.22–5.81)
RDW: 15.7 % — ABNORMAL HIGH (ref 11.5–15.5)
WBC Count: 9 10*3/uL (ref 4.0–10.5)
nRBC: 0 % (ref 0.0–0.2)

## 2022-02-24 LAB — CMP (CANCER CENTER ONLY)
ALT: 19 U/L (ref 0–44)
AST: 24 U/L (ref 15–41)
Albumin: 3.8 g/dL (ref 3.5–5.0)
Alkaline Phosphatase: 117 U/L (ref 38–126)
Anion gap: 5 (ref 5–15)
BUN: 17 mg/dL (ref 8–23)
CO2: 28 mmol/L (ref 22–32)
Calcium: 8.6 mg/dL — ABNORMAL LOW (ref 8.9–10.3)
Chloride: 106 mmol/L (ref 98–111)
Creatinine: 1.08 mg/dL (ref 0.61–1.24)
GFR, Estimated: >60 mL/min (ref >60)
Glucose, Bld: 135 mg/dL — ABNORMAL HIGH (ref 70–99)
Potassium: 3.6 mmol/L (ref 3.5–5.1)
Sodium: 139 mmol/L (ref 135–145)
Total Bilirubin: 1.2 mg/dL (ref 0.3–1.2)
Total Protein: 6.4 g/dL — ABNORMAL LOW (ref 6.5–8.1)

## 2022-02-24 MED ORDER — OXALIPLATIN CHEMO INJECTION 100 MG/20ML
50.0000 mg/m2 | Freq: Once | INTRAVENOUS | Status: AC
  Administered 2022-02-24: 110 mg via INTRAVENOUS
  Filled 2022-02-24: qty 10

## 2022-02-24 MED ORDER — SODIUM CHLORIDE 0.9 % IV SOLN
10.0000 mg | Freq: Once | INTRAVENOUS | Status: AC
  Administered 2022-02-24: 10 mg via INTRAVENOUS
  Filled 2022-02-24: qty 10

## 2022-02-24 MED ORDER — SODIUM CHLORIDE 0.9 % IV SOLN: INTRAVENOUS | Status: DC

## 2022-02-24 MED ORDER — HEPARIN SOD (PORK) LOCK FLUSH 100 UNIT/ML IV SOLN
500.0000 [IU] | Freq: Once | INTRAVENOUS | Status: AC | PRN
  Administered 2022-02-24: 500 [IU]

## 2022-02-24 MED ORDER — DEXTROSE 5 % IV SOLN: Freq: Once | INTRAVENOUS | Status: AC

## 2022-02-24 MED ORDER — SODIUM CHLORIDE 0.9% FLUSH
10.0000 mL | INTRAVENOUS | Status: DC | PRN
  Administered 2022-02-24: 10 mL

## 2022-02-24 MED ORDER — SODIUM CHLORIDE 0.9 % IV SOLN
7.5000 mg/kg | Freq: Once | INTRAVENOUS | Status: AC
  Administered 2022-02-24: 800 mg via INTRAVENOUS
  Filled 2022-02-24: qty 32

## 2022-02-24 MED ORDER — PALONOSETRON HCL INJECTION 0.25 MG/5ML
0.2500 mg | Freq: Once | INTRAVENOUS | Status: AC
  Administered 2022-02-24: 0.25 mg via INTRAVENOUS
  Filled 2022-02-24: qty 5

## 2022-02-24 NOTE — Progress Notes (Signed)
Dave   [phone] [phone]   Clinic Follow up Note   Patient Care Team: [name], MD (Inactive) as PCP - General (Family Medicine) [name], MD as Consulting Physician (Cardiology) [name], MD as Consulting Physician (Gastroenterology) [name], MD as Consulting Physician (General Surgery) [name], MD as Consulting Physician (General Surgery) [name], MD as Consulting Physician (Urology) [name], MD as Consulting Physician (Medical Oncology)  Date of Service:  02/24/2022  CHIEF COMPLAINT: f/u of metastatic colon cancer  CURRENT THERAPY:  -Bevacizumab/Xeloda restarted 08/12/21             -Xeloda dose: 1044m BID, days 1-14 q21days -Oxaliplatin restarted 12/17/21  ASSESSMENT & PLAN:  Dave BAYRONis a 76y.o. male with   1. Cancer of left colon, adenocarcinoma, stage IIIB (pT3N1cM0), MSI-stable, KRAS Q61H mutation (+), liver and peritoneal metastasis in 12/2019  -Diagnosed in 01/2018. S/p surgery and adjuvant FOLFOX. He did not tolerate well and declined additional treatment at that time. -restaging in 12/2019 revealed local recurrence in left abdominal wall and peritoneal metastasis, 2 hypermetabolic liver mets. Abdominal wall biopsy confirmed metastatic colorectal adenocarcinoma in 01/2020  -FO showed KRAS mutation (+), he is not a candidate for EGFR inhibitor.   -received 8 cycles FOLFIRI 01/28/20 - 05/14/20, Bevacizumab added with cycle 2. Tolerated well with fatigue, nausea, diarrhea. CEA normalized after cycle 4 -switched to maintenance xeloda and beva 06/02/20. Beva was held 01/13/21 - 06/10/21 due to high proteinuria -abdominal scar excision on 05/05/21 under Dr. RRosendo Gros Pathology revealed adenocarcinoma.  -due to progressive disease, he restarted oxaliplatin and bevacizumab on 06/11/21. He tolerated poorly with a number of side effects, thus oxali was discontinued and beva was held.  He was able to recover. -he restarted bevacizumab on 08/12/21 and Xeloda on 08/13/21. He tolerated well -restaging CT abdomen 11/25/21, showed increase in hepatic metastasis and progression of nodularity along left abdominal wall. -his CEA has also been rising, most recently up to 36.99 on 11/12/21, consistent with cancer progression  -he restarted oxaliplatin on 12/17/21 at lower dose. He has declined 5-fu pump infusion  -[facility] recommended against radiation to the abdominal wall and to continue oxaliplatin for several more cycles. -he continues to experience cold sensitivity, weakness, and constipation after treatment, as well as continued abdominal pain. He recovers well after the first week, but he still feels this is hard on him. I discussed options with him, including lowering the oxali dose, changing back to 5-FU (refuses), or changing to irinotecan. He would like to continue oxaliplatin at lower dose. -he is tolerating well at reduced dose. Labs reviewed, CMP and CBC WNL, will proceed with C4.  -plan to repeat scan prior to next treatment; I ordered today.   2. Left side abdominal pain -Occurs when he stands or walks for 5 to 10 minutes -Likely secondary to chemo deposits in the previous surgical site -He has a palpable hard nodules around the incision -he declined palliative radiation per recommendation by Dr. DEstelle Grumbles -he accepted prescription for oxycodone when offered by Dr. DEstelle Grumbles will continue pain management there.     PLAN: -proceed with cycle 4 Oxaliplatin/bevacizumab today at further reduced dose  -continue Xeloda at same dose  -CT to be done in 2-3 weeks before next cycle -lab, flush, f/u, and oxali/beva on 10/11 as scheduled   No problem-specific Assessment & Plan notes found for this encounter.  SUMMARY OF ONCOLOGIC HISTORY: Oncology History Overview Note   Cancer Staging  Cancer of left colon [facility]) Staging form: Colon and Rectum,  AJCC 8th Edition - Pathologic stage from 01/11/2018: Stage IIIB (pT3, pN1c, cM0) - Signed by [name], MD on 01/16/2018 Total positive nodes: 0 Histologic grading system: 4 grade system Histologic grade (G): G2     Cancer of left colon ([place])  01/10/2018 Imaging   CT AP W Contrast 01/10/18  IMPRESSION: Irregular soft tissue density causing stricture of the mid descending colon likely the site of obstruction for the dilated small bowel. 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 No evidence of perforation.   Equivocal findings involving the appendix measuring 1.2 cm at the appendiceal tip with mucosal enhancement. No adjacent free fluid or inflammatory change. Findings are nonspecific, but can be seen with early acute appendicitis.   Mild prostatic enlargement. Increased density over the posterior bladder base likely due to the large prostatic impression although cannot completely exclude a bladder mass. Urology protocol CT or ultrasound may be helpful for better evaluation.   Mild cholelithiasis.   Stable 1.5 cm cystic structure over the lower pole right kidney likely slightly hyperdense cyst.   Diverticulosis of the colon.   Aortic Atherosclerosis (ICD10-I70.0).   01/11/2018 Cancer Staging   Staging form: Colon and Rectum, AJCC 8th Edition - Pathologic stage from 01/11/2018: Stage IIIB (pT3, pN1c, cM0) - Signed by [name], MD on 01/16/2018   01/11/2018 Surgery   LEFT COLON RESECTION, TAKEDOWN SPLENIC FLEXURE, COLOSTOMY by Dr. [name]    01/11/2018 Procedure   Colonoscopy 01/11/18 by Dr. [name]  - Malignant completely obstructing tumor in the mid descending colon. Tattooed. - Diverticulosis in the sigmoid colon. - Non-bleeding internal hemorrhoids. - No specimens collected.   01/11/2018 Pathology Results   Diagnosis 01/11/18  1. Colon, segmental resection for tumor, descending colon - INVASIVE COLORECTAL ADENOCARCINOMA, 4 CM. - TUMOR EXTENDS INTO PERICOLONIC CONNECTIVE TISSUE. - TUMOR  FOCALLY INVOLVES RADIAL MARGIN. - ONE MESENTERIC TUMOR DEPOSIT. - THIRTEEN BENIGN LYMPH NODES (0/13). 2. Colon, segmental resection, splenic flexure - BENIGN COLON. - NO EVIDENCE OF MALIGNANCY .   01/11/2018 Tumor Marker   Baseline CEA at 3.4   01/16/2018 Initial Diagnosis   Cancer of left colon ([place])   01/23/2018 Imaging   CT CHEST WO CONTRAST IMPRESSION: 1. No evidence for metastatic disease within the chest. 2. Small left pleural effusion with underlying opacities which may represent atelectasis. Right basilar atelectasis. 3. Few foci of gas within the upper abdomen in the omentum with surrounding fat stranding, likely postsurgical 4. Aortic Atherosclerosis (ICD10-I70.0).   03/08/2018 - 05/15/2018 Chemotherapy   adjuvant FOLOFX. Due to side effects of neuropahty Oxaliplatin was stopped after 3 cycles and chemo was stopped after 6 cycles. He declined completing 6 months of chemo treatment.     03/19/2018 Imaging   03/19/2018 CT AP IMPRESSION: 1. Interval partial left hemicolectomy and descending colostomy. 2. Heterogeneous soft tissue density along the left anterior renal fascia is most likely postoperative (favor fat necrosis). No well-defined fluid collection. 3. Mild left lower quadrant edema, new since 01/10/2018. This could be postoperative. Superimposed sigmoid diverticulitis and/or cystitis cannot be excluded. 4. Subtle hyperenhancing nodule within the anterior bladder dome cannot be excluded. Consider nonemergent cystoscopy. When this is performed, recommend attention to the left ureterovesicular junction and distal left ureter to evaluate questionable soft tissue fullness. 5. Cholelithiasis. 6.  Aortic Atherosclerosis (ICD10-I70.0). 7. Prostatomegaly.   11/13/2018 Imaging   CT CAP WO Contrast  11/13/18  IMPRESSION: 1. Reversal of left lower quadrant colostomy with sigmoid colon anastomosis. No complicating features. No findings for residual or recurrent tumor or  metastatic disease involving the chest, abdomen or pelvis without contrast. 2. No acute abdominal/pelvic findings. 3. Gallbladder sludge and gallstones but no findings for acute cholecystitis. 4. Stable anterior abdominal wall hernia. 5. The right testicle is in the right inguinal canal.   10/03/2019 Imaging   CT CAP WO contrast  IMPRESSION: 1. New rounded density interposed between the prostate gland and anterior upper rectal wall could represent adenopathy or local extension of anterior rectal tumor. 2. Marked prostatomegaly, prostate volume 150 cubic cm. 3. Other imaging findings of potential clinical significance: Aortic Atherosclerosis (ICD10-I70.0). Coronary atherosclerosis. Trace right pleural effusion. Suspected cholelithiasis. Nonobstructive left nephrolithiasis. Multilevel lumbar impingement. Bilateral mildly retracted testicles. Hypodense exophytic lesion of the right kidney, most likely to be a cyst.   11/02/2019 Procedure   colonoscopy on 11/02/2019 by Dr. [name] Plan showed normal digital rectal exam, 3 polyps in the rectum, descending colon and cecum, and a prior sigmoid: Anastomosis characterized by erythema.  He found an extrinsic nonobstructing medium-sized mass in the proximal rectum about 4 cm in length, no internal rectal mass.   Diagnosis Surgical [P], colon, cecum, descending, rectal, polyp (3) - TUBULAR ADENOMA (TWO) - NO HIGH GRADE DYSPLASIA OR CARCINOMA. - COLONIC FRAGMENT WITH BENIGN LYMPHOID AGGREGATE.   12/07/2019 Imaging   MRI pelvis IMPRESSION: 1. Masslike area in the rectum suspicious for rectal neoplasm, likely T4b based on the appearance of soft tissue extending along the anterior peritoneal reflection and into the seminal vesicles. Correlation with recent colonoscopy results may be helpful. Area of anastomosis and other areas of the pelvis are not imaged on today's exam.   12/21/2019 PET scan   IMPRESSION: 1. Unfortunately evidence for peritoneal  metastasis. Intensely hypermetabolic nodules along the LEFT pericolic gutter. Favor hypermetabolic mass anterior to the rectum to represent serosal implant along the ventral surface of the rectum. 2. local recurrence within the LEFT abdominal wall at site prior colostomy. Intense hypermetabolic thickening of the rectus muscle at this site. 3. Two hypermetabolic hepatic metastasis.   01/15/2020 Relapse/Recurrence   FINAL MICROSCOPIC DIAGNOSIS:   A. SOFT TISSUE, LEFT ABDOMINAL WALL, BIOPSY:  - Adenocarcinoma.  - See comment.   COMMENT:   The morphology is consistent with metastatic colorectal adenocarcinoma.    01/28/2020 -  Chemotherapy   First line FOLFIRI q2weeks starting 01/28/20 for 8 cycles.  -----Bevacizumab added with C2.  -----Changed to maintenance Xeloda 2000 mg twice daily for 2 weeks on/1 week off and bevacizumab 06/02/20. Starting with C3, dose reduce to 1566m BID due to skin toxicity.   05/05/2020 Imaging   IMPRESSION: 1. Improved appearance, with reduced size of the hepatic metastatic lesions and reduced size of the peritoneal tumor implants. 2. Other imaging findings of potential clinical significance: Notable prostatomegaly. Multilevel impingement in the lumbar spine. Dependent density in the gallbladder possibly from sludge or gallstones. Degenerative glenohumeral arthropathy bilaterally. 3. Aortic atherosclerosis.   08/15/2020 Imaging   CT CAP  IMPRESSION: Chest Impression:   No evidence of thoracic metastasis   Abdomen / Pelvis Impression:   1. Hepatic metastasis are no longer measurable by CT imaging. 2. Peritoneal nodular metastasis in the LEFT abdomen are decreased in size. 3. No evidence of new peritoneal disease. 4. Thickening and LEFT rectus muscle at site of prior metastasis. No interval change. 5. No evidence of new or progressive colorectal carcinoma.  11/27/2020 Imaging   CT CAP  IMPRESSION: 1. There are multiple small pulmonary  nodules in the right upper lobe that are new or enlarged compared to prior examination but measuring 4 mm or smaller, suspicious for pulmonary metastatic disease. 2. Unchanged peritoneal nodule adjacent to the tip of the spleen measuring 1.3 x 1.2 cm. Slightly peritoneal nodule adjacent to the splenic flexure measuring no greater than 6 mm, previously 8 mm. 3. No significant change in previously hypermetabolic soft tissue mass centered about the left lower quadrant colostomy site. 4. Previously established hypermetabolic hepatic metastatic disease remains inapparent by CT. 5. Status post sigmoid colon resection and reanastomosis. 6. Prostatomegaly with thickening of the decompressed urinary bladder, likely secondary to chronic outlet obstruction. 7. Cholelithiasis.   Aortic Atherosclerosis (ICD10-I70.0).   01/29/2021 Imaging   CT CAP   IMPRESSION: 1. Minimal interval progression of bilateral pulmonary nodules, concerning for metastatic disease. 2. No substantial change in size of the peritoneal nodule at the inferior tip of the spleen and splenic flexure nodule. 3. Nodular soft tissue measured at the ostomy site previously is not evident today. 4. Marked prostatomegaly. 5. Cholelithiasis. 6. Aortic Atherosclerosis (ICD10-I70.0).   04/06/2021 Imaging   EXAM: CT ABDOMEN AND PELVIS WITHOUT CONTRAST (Renal Stone Protocol)  IMPRESSION: 1. Bladder wall thickening with surrounding inflammation concerning for cystitis. 2. Partial colectomy. Colon is air-filled with relative transition just proximal to the anastomosis. Stricture or recurrence at this level would be difficult to exclude. No focal mass identified. 3. There is a new subcutaneous fluid collection at the level of prior ostomy in the left abdominal wall. Infection not excluded. 4. There is new intramuscular hyperdensity at the level of the prior ostomy, indeterminate. Findings may related to scarring, tumor recurrence or  metastatic disease is not excluded. 5. Stable nodularity in the left upper quadrant. 6. Cholelithiasis. 7. Trace right pleural effusion. 8. Stable prostatomegaly. 9.  Aortic Atherosclerosis (ICD10-I70.0).   05/05/2021 Pathology Results   FINAL MICROSCOPIC DIAGNOSIS:   A. ABDOMINAL WALL SCAR, EXCISION:  -  Adenocarcinoma  -  See comment   COMMENT:  Morphologically consistent with colonic adenocarcinoma (similar to  previously reported ([id number]).   06/02/2021 Imaging   EXAM: CT CHEST, ABDOMEN, AND PELVIS WITH CONTRAST  IMPRESSION: 1. Progressive multifocal pulmonary metastatic disease. 2. New low-density hepatic lesions, likely metastases. 3. Progressive peritoneal implant near the splenic flexure of the colon. Small peritoneal implant inferior to the spleen appears unchanged. 4. Increased soft tissue nodularity near the midline incision in the upper anterior abdominal wall, suspicious for tumor recurrence at the incision. This could reflect keloid formation. 5. Fluid collection in the left anterior abdominal wall attributed to recent abdominal surgery with associated small incisional hernia containing fat. 6. Additional incidental findings including prostatomegaly, cholelithiasis and Aortic Atherosclerosis (ICD10-I70.0).   06/11/2021 - 11/26/2021 Chemotherapy   Patient is on Treatment Plan : COLORECTALXeloda + Bevacizumab q21d     07/29/2021 Imaging   EXAMINATION: CT ABDOMEN PELVIS WO CONTRAST  Impression  1. Left anterior abdominal wall postsurgical changes with no recurrent hernia.  2. Stable indeterminate 3 cm ventral abdominal wall mass located at the L1-2 level. Question scarring from prior surgery versus neoplasm.  3. Cholelithiasis.  4. Stable indeterminate bibasilar lung nodules. All of with a CT chest is recommended.  5. Indeterminate stable right hepatic lesions. Consider follow-up with an abdominal MRI with and without contrast for additional  characterization.     12/17/2021 - 02/01/2022 Chemotherapy   Patient is on Treatment  Plan : COLORECTAL CapeOx + Bevacizumab q21d     12/17/2021 -  Chemotherapy   Patient is on Treatment Plan : COLORECTAL CapeOx + Bevacizumab q21d        INTERVAL HISTORY:  Dave Vaughn is here for a follow up of metastatic colon cancer. He was last seen by me on 02/01/22. He presents to the clinic alone. He reports continued abdominal pain with walking. He explains he doesn't have pain with sitting or even riding horses. He reports his side effects are getting worse with each cycle. He explains his side effects last about a week now.   All other systems were reviewed with the patient and are negative.  MEDICAL HISTORY:  Past Medical History:  Diagnosis Date   Adenocarcinoma, colon ([place]) dx'd 01/2018   Anemia    taking iron supplements   Anxiety    Atrial fibrillation with RVR ([facility])    Colonic obstruction ([place]) 01/10/2018   Depression    Diverticulitis    Dysrhythmia    afib   History of kidney stones    Hypertension    Indwelling Foley catheter present    due to UTI    SURGICAL HISTORY: Past Surgical History:  Procedure Laterality Date   ANKLE SURGERY Left    when he was in college   COLON RESECTION N/A 01/11/2018   Procedure: LEFT COLON RESECTION, TAKEDOWN SPLENIC FLEXURE, COLOSTOMY;  Surgeon: [name], MD;  Location: [facility];  Service: General;  Laterality: N/A;   COLONOSCOPY  01/11/2018   Procedure: COLONOSCOPY;  Surgeon: Jackquline Denmark, MD;  Location: [facility];  Service: Endoscopy;;   COLONOSCOPY  05/10/2018   colonscopy  05/10/2018   EXCISION OF KELOID N/A 05/05/2021   Procedure: ABDOMINAL SCAR EXCISION;  Surgeon: Ralene Ok, MD;  Location: [place];  Service: General;  Laterality: N/A;   FOREIGN BODY REMOVAL ABDOMINAL N/A 03/09/2021   Procedure: EXCISION OF FOREIGN BODY;  Surgeon: Ralene Ok, MD;  Location: [place];  Service: General;  Laterality: N/A;   HERNIA REPAIR      HERNIA REPAIR  03/02/2019   EXPLORATORY LAPAROTOMY (N/A Abdomen)   INCISIONAL HERNIA REPAIR N/A 03/02/2019   Procedure: INCISIONAL HERNIA REPAIR , RECTORECTUS VS TAR HERNIA REPAIR;  Surgeon: Ralene Ok, MD;  Location: [place];  Service: General;  Laterality: N/A;   INSERTION OF MESH N/A 03/02/2019   Procedure: Insertion Of Mesh;  Surgeon: Ralene Ok, MD;  Location: [place];  Service: General;  Laterality: N/A;   LAPAROTOMY N/A 03/02/2019   Procedure: EXPLORATORY LAPAROTOMY;  Surgeon: Ralene Ok, MD;  Location: [place];  Service: General;  Laterality: N/A;   LAPAROTOMY N/A 03/09/2021   Procedure: EXPLORATION OF ABDOMINAL WALL;  Surgeon: Ralene Ok, MD;  Location: [place];  Service: General;  Laterality: N/A;   LYSIS OF ADHESION N/A 06/12/2018   Procedure: LYSIS OF ADHESIONS;  Surgeon: [name], MD;  Location: [facility];  Service: General;  Laterality: N/A;   LYSIS OF ADHESION N/A 03/02/2019   Procedure: Lysis Of Adhesion;  Surgeon: Ralene Ok, MD;  Location: [place];  Service: General;  Laterality: N/A;   PORTACATH PLACEMENT Right 03/07/2018   Procedure: INSERTION PORT-A-CATH RIGHT SUBCLAVIAN;  Surgeon: [name], MD;  Location: [place];  Service: General;  Laterality: Right;   PROCTOSCOPY N/A 06/12/2018   Procedure: RIGID PROCTOSCOPY;  Surgeon: [name], MD;  Location: [facility];  Service: General;  Laterality: N/A;   thumb surgery   2018   cyst removal  I have reviewed the social history and family history with the patient and they are unchanged from previous note.  ALLERGIES:  has No Known Allergies.  MEDICATIONS:  Current Outpatient Medications  Medication Sig Dispense Refill   acetaminophen-codeine (TYLENOL #3) 300-30 MG tablet Take 1 tablet by mouth every 8 (eight) hours as needed for moderate pain. 20 tablet 0   apixaban (ELIQUIS) 5 MG TABS tablet Take 1 tablet (5 mg total) by mouth 2 (two) times daily. 60 tablet 6   b complex vitamins tablet Take 1 tablet by  mouth daily.     capecitabine (XELODA) 500 MG tablet TAKE 3 TABLETS BY MOUTH EVERY 12 HOURS AFTER A MEAL FOR 14 DAYS  ON THEN 7 DAYS OFF 84 tablet 1   diltiazem (CARDIZEM CD) 360 MG 24 hr capsule TAKE 1 CAPSULE(360 MG) BY MOUTH DAILY 90 capsule 3   finasteride (PROSCAR) 5 MG tablet Take 5 mg by mouth daily.     levothyroxine (SYNTHROID) 75 MCG tablet TAKE 1 TABLET(75 MCG) BY MOUTH DAILY 90 tablet 1   lidocaine-prilocaine (EMLA) cream Apply 1 application topically as needed. 30 g 1   Multiple Vitamins-Iron (MULTIVITAMIN/IRON PO) Take 1 tablet by mouth daily.      tamsulosin (FLOMAX) 0.4 MG CAPS capsule Take 0.4 mg by mouth 2 (two) times daily.      No current facility-administered medications for this visit.    PHYSICAL EXAMINATION: ECOG PERFORMANCE STATUS: 1 - Symptomatic but completely ambulatory  Vitals:   02/24/22 1131  BP: (!) 149/92  Pulse: 77  Resp: 14  Temp: 98.2 F (36.8 C)  SpO2: 97%   Wt Readings from Last 3 Encounters:  02/24/22 222 lb 6.4 oz (100.9 kg)  02/01/22 220 lb 8 oz (100 kg)  01/14/22 221 lb 9.6 oz (100.5 kg)     GENERAL:alert, no distress and comfortable SKIN: skin color normal, no rashes or significant lesions EYES: normal, Conjunctiva are pink and non-injected, sclera clear  NEURO: alert & oriented x 3 with fluent speech  LABORATORY DATA:  I have reviewed the data as listed    Latest Ref Rng & Units 02/24/2022   11:13 AM 02/01/2022   11:21 AM 01/14/2022   11:54 AM  CBC  WBC 4.0 - 10.5 K/uL 9.0  6.1  6.3   Hemoglobin 13.0 - 17.0 g/dL 14.0  14.7  14.8   Hematocrit 39.0 - 52.0 % 40.3  41.8  42.8   Platelets 150 - 400 K/uL 189  185  174         Latest Ref Rng & Units 02/24/2022   11:13 AM 02/01/2022   11:21 AM 01/14/2022   11:54 AM  CMP  Glucose 70 - 99 mg/dL 135  118  103   BUN 8 - 23 mg/dL 17  17  25    Creatinine 0.61 - 1.24 mg/dL 1.08  1.07  1.03   Sodium 135 - 145 mmol/L 139  138  138   Potassium 3.5 - 5.1 mmol/L 3.6  4.0  4.1   Chloride  98 - 111 mmol/L 106  103  103   CO2 22 - 32 mmol/L 28  30  31    Calcium 8.9 - 10.3 mg/dL 8.6  9.4  9.2   Total Protein 6.5 - 8.1 g/dL 6.4  6.9  7.1   Total Bilirubin 0.3 - 1.2 mg/dL 1.2  1.0  0.8   Alkaline Phos 38 - 126 U/L 117  105  96   AST 15 -  41 U/L 24  24  20    ALT 0 - 44 U/L 19  19  15        RADIOGRAPHIC STUDIES: I have personally reviewed the radiological images as listed and agreed with the findings in the report. No results found.    Orders Placed This Encounter  Procedures   CT CHEST ABDOMEN PELVIS W CONTRAST    Standing Status:   Future    Standing Expiration Date:   02/25/2023    Order Specific Question:   Preferred imaging location?    Answer:   [facility]    Order Specific Question:   Release to patient    Answer:   Immediate    Order Specific Question:   Is Oral Contrast requested for this exam?    Answer:   No oral contrast    Order Specific Question:   Reason for No Oral Contrast    Answer:   Other    Order Specific Question:   Please answer why no oral contrast is requested    Answer:   poor tolerance   CBC with Differential ([place] Only)    Standing Status:   Future    Standing Expiration Date:   04/08/2023   CMP ([place] only)    Standing Status:   Future    Standing Expiration Date:   04/08/2023   Total Protein, Urine dipstick    Standing Status:   Future    Standing Expiration Date:   04/08/2023   All questions were answered. The patient knows to call the clinic with any problems, questions or concerns. No barriers to learning was detected. The total time spent in the appointment was 30 minutes.     [name], MD 02/24/2022   I, Wilburn Mylar, am acting as scribe for [name], MD.   I have reviewed the above documentation for accuracy and completeness, and I agree with the above.

## 2022-02-24 NOTE — Patient Instructions (Signed)
[facility]  Discharge Instructions: Thank you for choosing [facility] to provide your oncology and hematology care.   If you have a lab appointment with the [facility], please go directly to the [facility] and check in at the registration area.   Wear comfortable clothing and clothing appropriate for easy access to any Portacath or PICC line.   We strive to give you quality time with your provider. You may need to reschedule your appointment if you arrive late (15 or more minutes).  Arriving late affects you and other patients whose appointments are after yours.  Also, if you miss three or more appointments without notifying the office, you may be dismissed from the clinic at the provider's discretion.      For prescription refill requests, have your pharmacy contact our office and allow 72 hours for refills to be completed.    Today you received the following chemotherapy and/or immunotherapy agents: zirabev, oxaliplatin      To help prevent nausea and vomiting after your treatment, we encourage you to take your nausea medication as directed.  BELOW ARE SYMPTOMS THAT SHOULD BE REPORTED IMMEDIATELY: *FEVER GREATER THAN 100.4 F (38 C) OR HIGHER *CHILLS OR SWEATING *NAUSEA AND VOMITING THAT IS NOT CONTROLLED WITH YOUR NAUSEA MEDICATION *UNUSUAL SHORTNESS OF BREATH *UNUSUAL BRUISING OR BLEEDING *URINARY PROBLEMS (pain or burning when urinating, or frequent urination) *BOWEL PROBLEMS (unusual diarrhea, constipation, pain near the anus) TENDERNESS IN MOUTH AND THROAT WITH OR WITHOUT PRESENCE OF ULCERS (sore throat, sores in mouth, or a toothache) UNUSUAL RASH, SWELLING OR PAIN  UNUSUAL VAGINAL DISCHARGE OR ITCHING   Items with * indicate a potential emergency and should be followed up as soon as possible or go to the Emergency Department if any problems should occur.  Please show the CHEMOTHERAPY ALERT CARD or IMMUNOTHERAPY ALERT CARD at  check-in to the Emergency Department and triage nurse.  Should you have questions after your visit or need to cancel or reschedule your appointment, please contact [facility]  Dept: [phone]  and follow the prompts.  Office hours are 8:00 a.m. to 4:30 p.m. Monday - Friday. Please note that voicemails left after 4:00 p.m. may not be returned until the following business day.  We are closed weekends and major holidays. You have access to a nurse at all times for urgent questions. Please call the main number to the clinic Dept: [phone] and follow the prompts.   For any non-urgent questions, you may also contact your provider using MyChart. We now offer e-Visits for anyone 13 and older to request care online for non-urgent symptoms. For details visit [email or web address].   Also download the MyChart app! Go to the app store, search "MyChart", open the app, select [facility], and log in with your MyChart username and password.  Masks are optional in the cancer centers. If you would like for your care team to wear a mask while they are taking care of you, please let them know. You may have one support person who is at least 75 years old accompany you for your appointments.

## 2022-02-26 ENCOUNTER — Other Ambulatory Visit: Payer: Self-pay

## 2022-03-03 ENCOUNTER — Other Ambulatory Visit: Payer: Self-pay | Admitting: Hematology

## 2022-03-03 DIAGNOSIS — C186 Malignant neoplasm of descending colon: Secondary | ICD-10-CM

## 2022-03-05 ENCOUNTER — Telehealth: Payer: Self-pay

## 2022-03-05 ENCOUNTER — Other Ambulatory Visit: Payer: Self-pay | Admitting: Hematology

## 2022-03-05 MED ORDER — ESZOPICLONE 1 MG PO TABS: 1.0000 mg | ORAL_TABLET | Freq: Every evening | ORAL | 0 refills | Status: DC | PRN

## 2022-03-05 NOTE — Telephone Encounter (Signed)
Wife called stating patient is in a sleep pattern of waking between 2 and 3 AM perseverating on health issues etc. and is unable to fall back to sleep. Requesting something  For sleep or to relax him at night."  Routed to provider.

## 2022-03-05 NOTE — Telephone Encounter (Signed)
Left VM advising of new prescription for Lunesta sent to patient's pharmacy.

## 2022-03-08 ENCOUNTER — Other Ambulatory Visit: Payer: Self-pay

## 2022-03-15 ENCOUNTER — Ambulatory Visit (HOSPITAL_COMMUNITY): Payer: BC Managed Care – PPO

## 2022-03-15 ENCOUNTER — Other Ambulatory Visit: Payer: Self-pay | Admitting: Cardiology

## 2022-03-15 ENCOUNTER — Other Ambulatory Visit: Payer: Self-pay

## 2022-03-15 ENCOUNTER — Encounter: Payer: BC Managed Care – PPO | Admitting: Family Medicine

## 2022-03-15 DIAGNOSIS — I482 Chronic atrial fibrillation, unspecified: Secondary | ICD-10-CM

## 2022-03-15 NOTE — Telephone Encounter (Signed)
Prescription refill request for Eliquis received. Indication: Afib  Last office visit: 08/17/21 [facility])  Scr: 1.08 (02/24/22)  Age: 76 Weight: 100.9kg  Appropriate dose and refill sent to requested pharmacy.

## 2022-03-16 MED FILL — Dexamethasone Sodium Phosphate Inj 100 MG/10ML: INTRAMUSCULAR | Qty: 1 | Status: AC

## 2022-03-16 NOTE — Progress Notes (Unsigned)
[name]   [phone] Fax:([phone]   Clinic Follow up Note   Patient Care Team: System, Provider Not In as PCP - General Dave Forward, MD as Consulting Physician (Cardiology) Dave Artist, MD as Consulting Physician (Gastroenterology) Dave Boston, MD as Consulting Physician (General Surgery) Dave Skates, MD as Consulting Physician (General Surgery) Dave Mons, MD as Consulting Physician (Urology) Dave Merle, MD as Consulting Physician (Medical Oncology) 03/17/2022  CHIEF COMPLAINT: Follow-up metastatic colon cancer  SUMMARY OF ONCOLOGIC HISTORY: Oncology History Overview Note   Cancer Staging  Cancer of left colon [facility]) Staging form: Colon and Rectum, AJCC 8th Edition - Pathologic stage from 01/11/2018: Stage IIIB (pT3, pN1c, cM0) - Signed by Dave Merle, MD on 01/16/2018 Total positive nodes: 0 Histologic grading system: 4 grade system Histologic grade (G): G2     Cancer of left colon ([place])  01/10/2018 Imaging   CT AP W Contrast 01/10/18  IMPRESSION: Irregular soft tissue density causing stricture of the mid descending colon likely the site of obstruction for the dilated small bowel. 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 No evidence of perforation.   Equivocal findings involving the appendix measuring 1.2 cm at the appendiceal tip with mucosal enhancement. No adjacent free fluid or inflammatory change. Findings are nonspecific, but can be seen with early acute appendicitis.   Mild prostatic enlargement. Increased density over the posterior bladder base likely due to the large prostatic impression although cannot completely exclude a bladder mass. Urology protocol CT or ultrasound may be helpful for better evaluation.   Mild cholelithiasis.   Stable 1.5 cm cystic structure over the lower pole right kidney likely slightly hyperdense cyst.   Diverticulosis of the colon.   Aortic Atherosclerosis (ICD10-I70.0).    01/11/2018 Cancer Staging   Staging form: Colon and Rectum, AJCC 8th Edition - Pathologic stage from 01/11/2018: Stage IIIB (pT3, pN1c, cM0) - Signed by Dave Merle, MD on 01/16/2018   01/11/2018 Surgery   LEFT COLON RESECTION, TAKEDOWN SPLENIC FLEXURE, COLOSTOMY by Dr. Dalbert Vaughn    01/11/2018 Procedure   Colonoscopy 01/11/18 by Dr. Lyndel Vaughn  - Malignant completely obstructing tumor in the mid descending colon. Tattooed. - Diverticulosis in the sigmoid colon. - Non-bleeding internal hemorrhoids. - No specimens collected.   01/11/2018 Pathology Results   Diagnosis 01/11/18  1. Colon, segmental resection for tumor, descending colon - INVASIVE COLORECTAL ADENOCARCINOMA, 4 CM. - TUMOR EXTENDS INTO PERICOLONIC CONNECTIVE TISSUE. - TUMOR FOCALLY INVOLVES RADIAL MARGIN. - ONE MESENTERIC TUMOR DEPOSIT. - THIRTEEN BENIGN LYMPH NODES (0/13). 2. Colon, segmental resection, splenic flexure - BENIGN COLON. - NO EVIDENCE OF MALIGNANCY .   01/11/2018 Tumor Marker   Baseline CEA at 3.4   01/16/2018 Initial Diagnosis   Cancer of left colon ([place])   01/23/2018 Imaging   CT CHEST WO CONTRAST IMPRESSION: 1. No evidence for metastatic disease within the chest. 2. Small left pleural effusion with underlying opacities which may represent atelectasis. Right basilar atelectasis. 3. Few foci of gas within the upper abdomen in the omentum with surrounding fat stranding, likely postsurgical 4. Aortic Atherosclerosis (ICD10-I70.0).   03/08/2018 - 05/15/2018 Chemotherapy   adjuvant FOLOFX. Due to side effects of neuropahty Oxaliplatin was stopped after 3 cycles and chemo was stopped after 6 cycles. He declined completing 6 months of chemo treatment.     03/19/2018 Imaging   03/19/2018 CT AP IMPRESSION: 1. Interval partial left hemicolectomy and descending colostomy. 2. Heterogeneous soft tissue density along the left anterior renal fascia is most likely  postoperative (favor fat necrosis). No well-defined fluid  collection. 3. Mild left lower quadrant edema, new since 01/10/2018. This could be postoperative. Superimposed sigmoid diverticulitis and/or cystitis cannot be excluded. 4. Subtle hyperenhancing nodule within the anterior bladder dome cannot be excluded. Consider nonemergent cystoscopy. When this is performed, recommend attention to the left ureterovesicular junction and distal left ureter to evaluate questionable soft tissue fullness. 5. Cholelithiasis. 6.  Aortic Atherosclerosis (ICD10-I70.0). 7. Prostatomegaly.   11/13/2018 Imaging   CT CAP WO Contrast 11/13/18  IMPRESSION: 1. Reversal of left lower quadrant colostomy with sigmoid colon anastomosis. No complicating features. No findings for residual or recurrent tumor or metastatic disease involving the chest, abdomen or pelvis without contrast. 2. No acute abdominal/pelvic findings. 3. Gallbladder sludge and gallstones but no findings for acute cholecystitis. 4. Stable anterior abdominal wall hernia. 5. The right testicle is in the right inguinal canal.   10/03/2019 Imaging   CT CAP WO contrast  IMPRESSION: 1. New rounded density interposed between the prostate gland and anterior upper rectal wall could represent adenopathy or local extension of anterior rectal tumor. 2. Marked prostatomegaly, prostate volume 150 cubic cm. 3. Other imaging findings of potential clinical significance: Aortic Atherosclerosis (ICD10-I70.0). Coronary atherosclerosis. Trace right pleural effusion. Suspected cholelithiasis. Nonobstructive left nephrolithiasis. Multilevel lumbar impingement. Bilateral mildly retracted testicles. Hypodense exophytic lesion of the right kidney, most likely to be a cyst.   11/02/2019 Procedure   colonoscopy on 11/02/2019 by Dr. [name] Vaughn showed normal digital rectal exam, 3 polyps in the rectum, descending colon and cecum, and a prior sigmoid: Anastomosis characterized by erythema.  He found an extrinsic nonobstructing  medium-sized mass in the proximal rectum about 4 cm in length, no internal rectal mass.   Diagnosis Surgical [P], colon, cecum, descending, rectal, polyp (3) - TUBULAR ADENOMA (TWO) - NO HIGH GRADE DYSPLASIA OR CARCINOMA. - COLONIC FRAGMENT WITH BENIGN LYMPHOID AGGREGATE.   12/07/2019 Imaging   MRI pelvis IMPRESSION: 1. Masslike area in the rectum suspicious for rectal neoplasm, likely T4b based on the appearance of soft tissue extending along the anterior peritoneal reflection and into the seminal vesicles. Correlation with recent colonoscopy results may be helpful. Area of anastomosis and other areas of the pelvis are not imaged on today's exam.   12/21/2019 PET scan   IMPRESSION: 1. Unfortunately evidence for peritoneal metastasis. Intensely hypermetabolic nodules along the LEFT pericolic gutter. Favor hypermetabolic mass anterior to the rectum to represent serosal implant along the ventral surface of the rectum. 2. local recurrence within the LEFT abdominal wall at site prior colostomy. Intense hypermetabolic thickening of the rectus muscle at this site. 3. Two hypermetabolic hepatic metastasis.   01/15/2020 Relapse/Recurrence   FINAL MICROSCOPIC DIAGNOSIS:   A. SOFT TISSUE, LEFT ABDOMINAL WALL, BIOPSY:  - Adenocarcinoma.  - See comment.   COMMENT:   The morphology is consistent with metastatic colorectal adenocarcinoma.    01/28/2020 -  Chemotherapy   First line FOLFIRI q2weeks starting 01/28/20 for 8 cycles.  -----Bevacizumab added with C2.  -----Changed to maintenance Xeloda 2000 mg twice daily for 2 weeks on/1 week off and bevacizumab 06/02/20. Starting with C3, dose reduce to 1563m BID due to skin toxicity.   05/05/2020 Imaging   IMPRESSION: 1. Improved appearance, with reduced size of the hepatic metastatic lesions and reduced size of the peritoneal tumor implants. 2. Other imaging findings of potential clinical significance: Notable prostatomegaly. Multilevel  impingement in the lumbar spine. Dependent density in the gallbladder possibly from sludge or gallstones. Degenerative glenohumeral arthropathy bilaterally.  3. Aortic atherosclerosis.   08/15/2020 Imaging   CT CAP  IMPRESSION: Chest Impression:   No evidence of thoracic metastasis   Abdomen / Pelvis Impression:   1. Hepatic metastasis are no longer measurable by CT imaging. 2. Peritoneal nodular metastasis in the LEFT abdomen are decreased in size. 3. No evidence of new peritoneal disease. 4. Thickening and LEFT rectus muscle at site of prior metastasis. No interval change. 5. No evidence of new or progressive colorectal carcinoma.     11/27/2020 Imaging   CT CAP  IMPRESSION: 1. There are multiple small pulmonary nodules in the right upper lobe that are new or enlarged compared to prior examination but measuring 4 mm or smaller, suspicious for pulmonary metastatic disease. 2. Unchanged peritoneal nodule adjacent to the tip of the spleen measuring 1.3 x 1.2 cm. Slightly peritoneal nodule adjacent to the splenic flexure measuring no greater than 6 mm, previously 8 mm. 3. No significant change in previously hypermetabolic soft tissue mass centered about the left lower quadrant colostomy site. 4. Previously established hypermetabolic hepatic metastatic disease remains inapparent by CT. 5. Status post sigmoid colon resection and reanastomosis. 6. Prostatomegaly with thickening of the decompressed urinary bladder, likely secondary to chronic outlet obstruction. 7. Cholelithiasis.   Aortic Atherosclerosis (ICD10-I70.0).   01/29/2021 Imaging   CT CAP   IMPRESSION: 1. Minimal interval progression of bilateral pulmonary nodules, concerning for metastatic disease. 2. No substantial change in size of the peritoneal nodule at the inferior tip of the spleen and splenic flexure nodule. 3. Nodular soft tissue measured at the ostomy site previously is not evident today. 4. Marked  prostatomegaly. 5. Cholelithiasis. 6. Aortic Atherosclerosis (ICD10-I70.0).   04/06/2021 Imaging   EXAM: CT ABDOMEN AND PELVIS WITHOUT CONTRAST (Renal Stone Protocol)  IMPRESSION: 1. Bladder wall thickening with surrounding inflammation concerning for cystitis. 2. Partial colectomy. Colon is air-filled with relative transition just proximal to the anastomosis. Stricture or recurrence at this level would be difficult to exclude. No focal mass identified. 3. There is a new subcutaneous fluid collection at the level of prior ostomy in the left abdominal wall. Infection not excluded. 4. There is new intramuscular hyperdensity at the level of the prior ostomy, indeterminate. Findings may related to scarring, tumor recurrence or metastatic disease is not excluded. 5. Stable nodularity in the left upper quadrant. 6. Cholelithiasis. 7. Trace right pleural effusion. 8. Stable prostatomegaly. 9.  Aortic Atherosclerosis (ICD10-I70.0).   05/05/2021 Pathology Results   FINAL MICROSCOPIC DIAGNOSIS:   A. ABDOMINAL WALL SCAR, EXCISION:  -  Adenocarcinoma  -  See comment   COMMENT:  Morphologically consistent with colonic adenocarcinoma (similar to  previously reported ([id number]).   06/02/2021 Imaging   EXAM: CT CHEST, ABDOMEN, AND PELVIS WITH CONTRAST  IMPRESSION: 1. Progressive multifocal pulmonary metastatic disease. 2. New low-density hepatic lesions, likely metastases. 3. Progressive peritoneal implant near the splenic flexure of the colon. Small peritoneal implant inferior to the spleen appears unchanged. 4. Increased soft tissue nodularity near the midline incision in the upper anterior abdominal wall, suspicious for tumor recurrence at the incision. This could reflect keloid formation. 5. Fluid collection in the left anterior abdominal wall attributed to recent abdominal surgery with associated small incisional hernia containing fat. 6. Additional incidental findings  including prostatomegaly, cholelithiasis and Aortic Atherosclerosis (ICD10-I70.0).   06/11/2021 - 11/26/2021 Chemotherapy   Patient is on Treatment Vaughn : COLORECTALXeloda + Bevacizumab q21d     07/29/2021 Imaging   EXAMINATION: CT ABDOMEN PELVIS WO CONTRAST  Impression  1. Left anterior abdominal wall postsurgical changes with no recurrent hernia.  2. Stable indeterminate 3 cm ventral abdominal wall mass located at the L1-2 level. Question scarring from prior surgery versus neoplasm.  3. Cholelithiasis.  4. Stable indeterminate bibasilar lung nodules. All of with a CT chest is recommended.  5. Indeterminate stable right hepatic lesions. Consider follow-up with an abdominal MRI with and without contrast for additional characterization.     12/17/2021 - 02/01/2022 Chemotherapy   Patient is on Treatment Vaughn : COLORECTAL CapeOx + Bevacizumab q21d     12/17/2021 -  Chemotherapy   Patient is on Treatment Vaughn : COLORECTAL CapeOx + Bevacizumab q21d       CURRENT THERAPY:  -Bevacizumab/Xeloda restarted 08/12/21             -Xeloda dose: $RemoveBefor'1000mg'[id number]$  BID, days 1-14 q21days -Oxaliplatin restarted 12/17/21, dose reduced 02/24/22 for cold sensitivity and other SE's  INTERVAL HISTORY: Mr. Ardis returns for follow-up as scheduled, last seen by Dr. Burr Medico 02/24/2022 and completed another cycle of CapeOx/Beva.  He has intense cold sensitivity that lasts up to a week, no residual neuropathy.  This was slightly better with reduced dose of oxaliplatin but not by much.  He is weak the day after treatment.  Constipation fluctuates depending on the day/diet.  Abdominal pain is stable on treatment, takes oxycodone every 3-4 hours and wears an abdominal binder.  He began the current cycle of Xeloda this Monday (currently on day 3); hands get dry and peel but no cracks or blisters.  This improves on his week off.  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  The Johnnye Sima has helped him sleep but he is  wondering if there is something else that can help him.  His CT scan is scheduled for 10/12, and he will be out of town the week of 11/1 when he is currently scheduled for next visit/cycle.  He would like to postpone his next visit for 1 week  All other systems were reviewed with the patient and are negative.  MEDICAL HISTORY:  Past Medical History:  Diagnosis Date   Adenocarcinoma, colon ([place]) dx'd 01/2018   Anemia    taking iron supplements   Anxiety    Atrial fibrillation with RVR ([facility])    Colonic obstruction ([place]) 01/10/2018   Depression    Diverticulitis    Dysrhythmia    afib   History of kidney stones    Hypertension    Indwelling Foley catheter present    due to UTI    SURGICAL HISTORY: Past Surgical History:  Procedure Laterality Date   ANKLE SURGERY Left    when he was in college   COLON RESECTION N/A 01/11/2018   Procedure: LEFT COLON RESECTION, TAKEDOWN SPLENIC FLEXURE, COLOSTOMY;  Surgeon: Dave Skates, MD;  Location: [facility];  Service: General;  Laterality: N/A;   COLONOSCOPY  01/11/2018   Procedure: COLONOSCOPY;  Surgeon: Jackquline Denmark, MD;  Location: [facility];  Service: Endoscopy;;   COLONOSCOPY  05/10/2018   colonscopy  05/10/2018   EXCISION OF KELOID N/A 05/05/2021   Procedure: ABDOMINAL SCAR EXCISION;  Surgeon: Ralene Ok, MD;  Location: [place];  Service: General;  Laterality: N/A;   FOREIGN BODY REMOVAL ABDOMINAL N/A 03/09/2021   Procedure: EXCISION OF FOREIGN BODY;  Surgeon: Ralene Ok, MD;  Location: [place];  Service: General;  Laterality: N/A;   HERNIA REPAIR     HERNIA REPAIR  03/02/2019   EXPLORATORY LAPAROTOMY (N/A Abdomen)  INCISIONAL HERNIA REPAIR N/A 03/02/2019   Procedure: INCISIONAL HERNIA REPAIR , RECTORECTUS VS TAR HERNIA REPAIR;  Surgeon: Ralene Ok, MD;  Location: [place];  Service: General;  Laterality: N/A;   INSERTION OF MESH N/A 03/02/2019   Procedure: Insertion Of Mesh;  Surgeon: Ralene Ok, MD;  Location: [place];   Service: General;  Laterality: N/A;   LAPAROTOMY N/A 03/02/2019   Procedure: EXPLORATORY LAPAROTOMY;  Surgeon: Ralene Ok, MD;  Location: [place];  Service: General;  Laterality: N/A;   LAPAROTOMY N/A 03/09/2021   Procedure: EXPLORATION OF ABDOMINAL WALL;  Surgeon: Ralene Ok, MD;  Location: [place];  Service: General;  Laterality: N/A;   LYSIS OF ADHESION N/A 06/12/2018   Procedure: LYSIS OF ADHESIONS;  Surgeon: Dave Boston, MD;  Location: [facility];  Service: General;  Laterality: N/A;   LYSIS OF ADHESION N/A 03/02/2019   Procedure: Lysis Of Adhesion;  Surgeon: Ralene Ok, MD;  Location: [place];  Service: General;  Laterality: N/A;   PORTACATH PLACEMENT Right 03/07/2018   Procedure: INSERTION PORT-A-CATH RIGHT SUBCLAVIAN;  Surgeon: Dave Skates, MD;  Location: [place];  Service: General;  Laterality: Right;   PROCTOSCOPY N/A 06/12/2018   Procedure: RIGID PROCTOSCOPY;  Surgeon: Dave Boston, MD;  Location: [facility];  Service: General;  Laterality: N/A;   thumb surgery   2018   cyst removal    I have reviewed the social history and family history with the patient and they are unchanged from previous note.  ALLERGIES:  has No Known Allergies.  MEDICATIONS:  Current Outpatient Medications  Medication Sig Dispense Refill   apixaban (ELIQUIS) 5 MG TABS tablet TAKE 1 TABLET(5 MG) BY MOUTH TWICE DAILY 60 tablet 5   b complex vitamins tablet Take 1 tablet by mouth daily.     capecitabine (XELODA) 500 MG tablet TAKE 3 TABLETS BY MOUTH EVERY 12 HOURS AFTER A MEAL FOR 14 DAYS  ON THEN 7 DAYS OFF 84 tablet 1   diltiazem (CARDIZEM CD) 360 MG 24 hr capsule TAKE 1 CAPSULE(360 MG) BY MOUTH DAILY 90 capsule 3   eszopiclone (LUNESTA) 1 MG TABS tablet Take 1 tablet (1 mg total) by mouth at bedtime as needed for sleep. Take immediately before bedtime 30 tablet 0   finasteride (PROSCAR) 5 MG tablet Take 5 mg by mouth daily.     levothyroxine (SYNTHROID) 75 MCG tablet TAKE 1 TABLET(75 MCG) BY MOUTH  DAILY 90 tablet 1   lidocaine-prilocaine (EMLA) cream Apply 1 application topically as needed. 30 g 1   Multiple Vitamins-Iron (MULTIVITAMIN/IRON PO) Take 1 tablet by mouth daily.      tamsulosin (FLOMAX) 0.4 MG CAPS capsule Take 0.4 mg by mouth 2 (two) times daily.      No current facility-administered medications for this visit.   Facility-Administered Medications Ordered in Other Visits  Medication Dose Route Frequency Provider Last Rate Last Admin   sodium chloride flush (NS) 0.9 % injection 10 mL  10 mL Intracatheter PRN Dave Merle, MD   10 mL at 03/17/22 1310    PHYSICAL EXAMINATION: ECOG PERFORMANCE STATUS: 1 - Symptomatic but completely ambulatory  Vitals:   03/17/22 0909  BP: (!) 153/98  Pulse: 66  Resp: 16  Temp: 98 F (36.7 C)  SpO2: 97%   Filed Weights   03/17/22 0909  Weight: 224 lb 1.6 oz (101.7 kg)    GENERAL:alert, no distress and comfortable SKIN: palms dry, no peeling or cracks EYES: sclera clear LUNGS: clear with normal breathing effort HEART:  regular rate & rhythm, no lower extremity edema ABDOMEN:abdomen soft, non-tender and normal bowel sounds NEURO: alert & oriented x 3 with fluent speech, no focal motor/sensory deficits PAC without erythema   LABORATORY DATA:  I have reviewed the data as listed    Latest Ref Rng & Units 03/17/2022    8:44 AM 02/24/2022   11:13 AM 02/01/2022   11:21 AM  CBC  WBC 4.0 - 10.5 K/uL 5.6  9.0  6.1   Hemoglobin 13.0 - 17.0 g/dL 14.0  14.0  14.7   Hematocrit 39.0 - 52.0 % 40.3  40.3  41.8   Platelets 150 - 400 K/uL 208  189  185         Latest Ref Rng & Units 03/17/2022    8:44 AM 02/24/2022   11:13 AM 02/01/2022   11:21 AM  CMP  Glucose 70 - 99 mg/dL 132  135  118   BUN 8 - 23 mg/dL _0 Creatinine 0.61 - 1.24 mg/dL 1.12  1.08  1.07   Sodium 135 - 145 mmol/L 140  139  138   Potassium 3.5 - 5.1 mmol/L 3.7  3.6  4.0   Chloride 98 - 111 mmol/L 105  106  103   CO2 22 - 32 mmol/L _1 Calcium  8.9 - 10.3 mg/dL 8.9  8.6  9.4   Total Protein 6.5 - 8.1 g/dL 6.8  6.4  6.9   Total Bilirubin 0.3 - 1.2 mg/dL 1.0  1.2  1.0   Alkaline Phos 38 - 126 U/L 115  117  105   AST 15 - 41 U/L _2 ALT 0 - 44 U/L _3 RADIOGRAPHIC STUDIES: I have personally reviewed the radiological images as listed and agreed with the findings in the report. No results found.   ASSESSMENT & Vaughn: Dave Vaughn is a 76 y.o. male with      1. Cancer of left colon, adenocarcinoma, stage IIIB (pT3N1cM0), MSI-stable, KRAS+ -Diagnosed in 01/2018. Treated with surgery and adjuvant FOLFOX. Due to side effects Oxaliplatin was stopped after 3 cycles and chemo was stopped after 6 cycles. He declined completing 6 months of chemo treatment.  -unfortunately, he developed local recurrence in left abdominal wall and peritoneal metastasis, 2 hypermetabolic liver lets in 06/6107. Abdominal wall biopsy conformed metastatic colorectal adenocarcinoma in 01/2020  -Began first line dose-reduced FOLFIRI on 01/28/2020 -[facility] showed Kras (+) positive, he is not a candidate for EGFR inhibitor.  Bevacizumab was added with cycle 2  -S/p 8 cycles of FOLFIRI and bevacizumab from cycle 2; tolerated well with fatigue, nausea, diarrhea. CEA normalized after cycle 4, then switched to maintenance xeloda and bevacizumab 06/02/2020.  Beva was held 01/13/2021 - 06/10/2021 due to proteinuria -He developed a hernia and has undergone multiple surgeries for repair and scar excision by Dr. Rosendo Gros, a scar excision on 05/05/2021 Path revealed adenocarcinoma -CT CAP 06/02/2021 showed progressive disease in the lung, liver, colon, and abdominal wall -He opted to restart CAPOX. He restarted oxaliplatin and bevacizumab on 06/11/21. He tolerated poorly with a number of side effects, thus oxali was discontinued and beva was held. He was able to recover. -he had CT AP on 07/29/21 in [place] (in [place]) showing: no recurrent hernia, stable  indeterminate 3 cm ventral abdominal wall mass, bibasilar lung nodules, and right hepatic lesions.  -  he restarted bevacizumab on 08/12/21 and Xeloda on 08/13/21 at 1500 mg twice daily for 2 weeks on/1 week off -chest CT on 09/16/21 showed: stable bilateral pulmonary nodules and peritoneal disease but worsening liver metastasis.  This is probably related to his treatment interruption during surgeries. The radiologists compared this scan to CT CAP in 05/2021 instead of from 07/2021.  -Restaging CT 11/25/2021 showed increase in hepatic metastasis and progression of nodularity along left abdominal wall, with rising CEA consistent with cancer progression -He restarted oxaliplatin on 12/17/2021 at lower dose, he declined 5-FU pump -He met with [facility] Estelle Grumbles [facility] on 12[facility]24/2021 who recommended against radiation to the abdominal wall and to continue oxalic for several more cycles -Oxali further dose reduced on 02/24/2022 due to cold sensitivity and other side effects -Mr. Goeden appears stable.  He continues CapeOx and bevacizumab every 3 weeks, tolerating moderately well with cold sensitivity lasting 1 week, significant fatigue on day 2, constipation, and hand/foot syndrome.  Abdominal pain is stable with oxycodone every 3-4 hours.  - Side effects are adequately managed with supportive care at home. He is able to recover and function well on his week off.  There is no clinical evidence of disease progression -Labs reviewed, we will follow the pending CEA from today.  Adequate to proceed with dose reduced oxaliplatin and bevacizumab today as planned, he is currently day 3 of the current Xeloda cycle, continue. -Scheduled for restaging CT tomorrow, will call with results -Will reschedule follow-up and next cycle from 11/1 to 11/8 per patient's request and travel schedule   2.  Anxiety -He has developed anxiety lately, secondary to #1, work, and other stressors -Johnnye Sima has helped him sleep so he is not up at night as  much thinking about things, but interested in something for the daytime -Not ideal candidate for benzo given that he takes oxycodone every 3-4 hours -I recommended SSRI such as sertraline, he declined due to the potential side effect profile and having to be weaned off if he wants to stop in the future -He agrees to social work referral for mental health  3.  Abdominal wall pain -Secondary to metastatic disease/deposits -Occurs when he stands or walks for 10 minutes or so -Stable on chemo and managed with oxycodone every 3-4 hours  4. Hypothyroidism -Previously on synthroid, TSH normal 2 years ago. He has not been taking synthroid for a while -TSH in 02/2020 up to 67, he went back on Synthroid since 02/16/2020   5.  BPH  -Followed by urologist Dr. Lovena Neighbours -Pelvic MRI on 7/1 shows marked prostatomegaly with signs of BPH extending into the bladder base.  -on Fosamax -Denies new or worsening urinary symptoms   6. Atrial Fibrillation -continue Eliquis. Rate controlled.  He can hold Eliquis periodically for bleeding hemorrhoids and surgeries -Continue follow-up with cardiology   7. CKD stage III -he developed mild increased Cr since he started chemo, EGFR around 40-50's  -I encouraged him to control his blood pressure, cholesterol and BG, and hydrate -Will avoid NSAIDs and CT IV contrast if GFR low -normal for past several months      [place]: -Labs reviewed, follow-up the pending CEA from today -Continue xeloda (currently day 3) -Proceed with oxali/beva today as planned -CT 10/12, will call with results -SW referral for anxiety -F/up and next cycle in 4 weeks due to pt travel plans    Orders Placed This Encounter  Procedures   Ambulatory referral to Social Work    Referral  Priority:   Urgent    Referral Type:   Consultation    Referral Reason:   Specialty Services Required    Number of Visits Requested:   1   All questions were answered. The patient knows to call the clinic  with any problems, questions or concerns. No barriers to learning was detected. I spent 20 minutes counseling the patient face to face. The total time spent in the appointment was 30 minutes and more than 50% was on counseling and review of test results     Alla Feeling, NP 03/17/22

## 2022-03-17 ENCOUNTER — Inpatient Hospital Stay: Payer: BC Managed Care – PPO

## 2022-03-17 ENCOUNTER — Inpatient Hospital Stay: Payer: BC Managed Care – PPO | Attending: Nurse Practitioner

## 2022-03-17 ENCOUNTER — Inpatient Hospital Stay (HOSPITAL_BASED_OUTPATIENT_CLINIC_OR_DEPARTMENT_OTHER): Payer: BC Managed Care – PPO | Admitting: Nurse Practitioner

## 2022-03-17 ENCOUNTER — Encounter: Payer: Self-pay | Admitting: Nurse Practitioner

## 2022-03-17 VITALS — BP 156/94 | HR 74 | Resp 17

## 2022-03-17 DIAGNOSIS — N183 Chronic kidney disease, stage 3 unspecified: Secondary | ICD-10-CM | POA: Insufficient documentation

## 2022-03-17 DIAGNOSIS — F419 Anxiety disorder, unspecified: Secondary | ICD-10-CM | POA: Insufficient documentation

## 2022-03-17 DIAGNOSIS — Z7901 Long term (current) use of anticoagulants: Secondary | ICD-10-CM | POA: Insufficient documentation

## 2022-03-17 DIAGNOSIS — C186 Malignant neoplasm of descending colon: Secondary | ICD-10-CM | POA: Diagnosis not present

## 2022-03-17 DIAGNOSIS — I4891 Unspecified atrial fibrillation: Secondary | ICD-10-CM | POA: Diagnosis not present

## 2022-03-17 DIAGNOSIS — Z95828 Presence of other vascular implants and grafts: Secondary | ICD-10-CM

## 2022-03-17 DIAGNOSIS — C786 Secondary malignant neoplasm of retroperitoneum and peritoneum: Secondary | ICD-10-CM | POA: Insufficient documentation

## 2022-03-17 DIAGNOSIS — C787 Secondary malignant neoplasm of liver and intrahepatic bile duct: Secondary | ICD-10-CM | POA: Diagnosis not present

## 2022-03-17 DIAGNOSIS — Z5111 Encounter for antineoplastic chemotherapy: Secondary | ICD-10-CM | POA: Diagnosis not present

## 2022-03-17 DIAGNOSIS — Z5112 Encounter for antineoplastic immunotherapy: Secondary | ICD-10-CM | POA: Insufficient documentation

## 2022-03-17 DIAGNOSIS — E039 Hypothyroidism, unspecified: Secondary | ICD-10-CM | POA: Diagnosis not present

## 2022-03-17 LAB — CBC WITH DIFFERENTIAL (CANCER CENTER ONLY)
Abs Immature Granulocytes: 0.01 10*3/uL (ref 0.00–0.07)
Basophils Absolute: 0 10*3/uL (ref 0.0–0.1)
Basophils Relative: 1 %
Eosinophils Absolute: 0.1 10*3/uL (ref 0.0–0.5)
Eosinophils Relative: 1 %
HCT: 40.3 % (ref 39.0–52.0)
Hemoglobin: 14 g/dL (ref 13.0–17.0)
Immature Granulocytes: 0 %
Lymphocytes Relative: 25 %
Lymphs Abs: 1.4 10*3/uL (ref 0.7–4.0)
MCH: 33.5 pg (ref 26.0–34.0)
MCHC: 34.7 g/dL (ref 30.0–36.0)
MCV: 96.4 fL (ref 80.0–100.0)
Monocytes Absolute: 0.5 10*3/uL (ref 0.1–1.0)
Monocytes Relative: 8 %
Neutro Abs: 3.6 10*3/uL (ref 1.7–7.7)
Neutrophils Relative %: 65 %
Platelet Count: 208 10*3/uL (ref 150–400)
RBC: 4.18 MIL/uL — ABNORMAL LOW (ref 4.22–5.81)
RDW: 16 % — ABNORMAL HIGH (ref 11.5–15.5)
WBC Count: 5.6 10*3/uL (ref 4.0–10.5)
nRBC: 0 % (ref 0.0–0.2)

## 2022-03-17 LAB — CMP (CANCER CENTER ONLY)
ALT: 19 U/L (ref 0–44)
AST: 24 U/L (ref 15–41)
Albumin: 3.9 g/dL (ref 3.5–5.0)
Alkaline Phosphatase: 115 U/L (ref 38–126)
Anion gap: 5 (ref 5–15)
BUN: 18 mg/dL (ref 8–23)
CO2: 30 mmol/L (ref 22–32)
Calcium: 8.9 mg/dL (ref 8.9–10.3)
Chloride: 105 mmol/L (ref 98–111)
Creatinine: 1.12 mg/dL (ref 0.61–1.24)
GFR, Estimated: >60 mL/min (ref >60)
Glucose, Bld: 132 mg/dL — ABNORMAL HIGH (ref 70–99)
Potassium: 3.7 mmol/L (ref 3.5–5.1)
Sodium: 140 mmol/L (ref 135–145)
Total Bilirubin: 1 mg/dL (ref 0.3–1.2)
Total Protein: 6.8 g/dL (ref 6.5–8.1)

## 2022-03-17 LAB — TOTAL PROTEIN, URINE DIPSTICK: Protein, ur: 30 mg/dL — AB

## 2022-03-17 LAB — CEA (IN HOUSE-CHCC): CEA (CHCC-In House): 45.53 ng/mL — ABNORMAL HIGH (ref 0.00–5.00)

## 2022-03-17 MED ORDER — OXALIPLATIN CHEMO INJECTION 100 MG/20ML
50.0000 mg/m2 | Freq: Once | INTRAVENOUS | Status: AC
  Administered 2022-03-17: 110 mg via INTRAVENOUS
  Filled 2022-03-17: qty 20

## 2022-03-17 MED ORDER — HEPARIN SOD (PORK) LOCK FLUSH 100 UNIT/ML IV SOLN
500.0000 [IU] | Freq: Once | INTRAVENOUS | Status: AC | PRN
  Administered 2022-03-17: 500 [IU]

## 2022-03-17 MED ORDER — SODIUM CHLORIDE 0.9 % IV SOLN
10.0000 mg | Freq: Once | INTRAVENOUS | Status: AC
  Administered 2022-03-17: 10 mg via INTRAVENOUS
  Filled 2022-03-17: qty 10

## 2022-03-17 MED ORDER — SODIUM CHLORIDE 0.9 % IV SOLN
7.5000 mg/kg | Freq: Once | INTRAVENOUS | Status: AC
  Administered 2022-03-17: 800 mg via INTRAVENOUS
  Filled 2022-03-17: qty 32

## 2022-03-17 MED ORDER — DEXTROSE 5 % IV SOLN: Freq: Once | INTRAVENOUS | Status: AC

## 2022-03-17 MED ORDER — PALONOSETRON HCL INJECTION 0.25 MG/5ML
0.2500 mg | Freq: Once | INTRAVENOUS | Status: AC
  Administered 2022-03-17: 0.25 mg via INTRAVENOUS
  Filled 2022-03-17: qty 5

## 2022-03-17 MED ORDER — SODIUM CHLORIDE 0.9% FLUSH
10.0000 mL | INTRAVENOUS | Status: DC | PRN
  Administered 2022-03-17: 10 mL

## 2022-03-17 MED ORDER — SODIUM CHLORIDE 0.9 % IV SOLN: Freq: Once | INTRAVENOUS | Status: AC

## 2022-03-18 ENCOUNTER — Ambulatory Visit (HOSPITAL_COMMUNITY)
Admission: RE | Admit: 2022-03-18 | Discharge: 2022-03-18 | Disposition: A | Discharge status: Home / Self Care | Payer: BC Managed Care – PPO | Source: Ambulatory Visit | Attending: Hematology | Admitting: Hematology

## 2022-03-18 DIAGNOSIS — C7800 Secondary malignant neoplasm of unspecified lung: Secondary | ICD-10-CM | POA: Diagnosis not present

## 2022-03-18 DIAGNOSIS — I2510 Atherosclerotic heart disease of native coronary artery without angina pectoris: Secondary | ICD-10-CM | POA: Diagnosis not present

## 2022-03-18 DIAGNOSIS — C186 Malignant neoplasm of descending colon: Secondary | ICD-10-CM | POA: Diagnosis not present

## 2022-03-18 DIAGNOSIS — C189 Malignant neoplasm of colon, unspecified: Secondary | ICD-10-CM | POA: Diagnosis not present

## 2022-03-18 DIAGNOSIS — K6389 Other specified diseases of intestine: Secondary | ICD-10-CM | POA: Diagnosis not present

## 2022-03-18 DIAGNOSIS — I700 Atherosclerosis of aorta: Secondary | ICD-10-CM | POA: Diagnosis not present

## 2022-03-18 DIAGNOSIS — R1900 Intra-abdominal and pelvic swelling, mass and lump, unspecified site: Secondary | ICD-10-CM | POA: Diagnosis not present

## 2022-03-18 DIAGNOSIS — N400 Enlarged prostate without lower urinary tract symptoms: Secondary | ICD-10-CM | POA: Diagnosis not present

## 2022-03-18 MED ORDER — IOHEXOL 300 MG/ML  SOLN
100.0000 mL | Freq: Once | INTRAMUSCULAR | Status: AC | PRN
  Administered 2022-03-18: 100 mL via INTRAVENOUS

## 2022-03-18 MED ORDER — HEPARIN SOD (PORK) LOCK FLUSH 100 UNIT/ML IV SOLN
500.0000 [IU] | Freq: Once | INTRAVENOUS | Status: AC
  Administered 2022-03-18: 500 [IU] via INTRAVENOUS

## 2022-03-18 MED ORDER — SODIUM CHLORIDE (PF) 0.9 % IJ SOLN
INTRAMUSCULAR | Status: AC
  Filled 2022-03-18: qty 50

## 2022-03-22 ENCOUNTER — Other Ambulatory Visit: Payer: Self-pay | Admitting: Nurse Practitioner

## 2022-03-22 DIAGNOSIS — C186 Malignant neoplasm of descending colon: Secondary | ICD-10-CM

## 2022-03-23 ENCOUNTER — Telehealth: Payer: Self-pay | Admitting: Hematology

## 2022-03-23 NOTE — Telephone Encounter (Signed)
Left message with follow-up appointment per 10/16 schedule message.

## 2022-03-24 ENCOUNTER — Other Ambulatory Visit: Payer: Self-pay

## 2022-03-25 ENCOUNTER — Encounter: Payer: Self-pay | Admitting: Hematology

## 2022-03-25 ENCOUNTER — Inpatient Hospital Stay: Payer: BC Managed Care – PPO | Admitting: Licensed Clinical Social Worker

## 2022-03-25 DIAGNOSIS — C186 Malignant neoplasm of descending colon: Secondary | ICD-10-CM

## 2022-03-25 DIAGNOSIS — C19 Malignant neoplasm of rectosigmoid junction: Secondary | ICD-10-CM | POA: Diagnosis not present

## 2022-03-25 DIAGNOSIS — Z23 Encounter for immunization: Secondary | ICD-10-CM | POA: Diagnosis not present

## 2022-03-25 DIAGNOSIS — C786 Secondary malignant neoplasm of retroperitoneum and peritoneum: Secondary | ICD-10-CM | POA: Diagnosis not present

## 2022-03-25 DIAGNOSIS — Z923 Personal history of irradiation: Secondary | ICD-10-CM | POA: Diagnosis not present

## 2022-03-25 NOTE — Progress Notes (Signed)
[facility] CSW Progress Note  Clinical Education officer, museum contacted patient by phone to discuss concerns regarding level of anxiety.  Pt reports he was prescribed Lunesta a couple of weeks ago to assist w/ sleep and would like to explore other options for addressing anxiety.  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  Pt resides w/ his spouse who provides emotional support, but emotional support is limited beyond that.  Pt is traveling out of town for two weeks next Tuesday and verbalized willingness to book a counseling session w/ CSW once he has looked at his schedule for the week following his trip.  CSW to remain available to provide supportive services as appropriate.      Henriette Combs, LCSW

## 2022-03-29 ENCOUNTER — Inpatient Hospital Stay (HOSPITAL_BASED_OUTPATIENT_CLINIC_OR_DEPARTMENT_OTHER): Payer: BC Managed Care – PPO | Admitting: Hematology

## 2022-03-29 ENCOUNTER — Encounter: Payer: Self-pay | Admitting: Hematology

## 2022-03-29 ENCOUNTER — Telehealth: Payer: Self-pay | Admitting: *Deleted

## 2022-03-29 DIAGNOSIS — C186 Malignant neoplasm of descending colon: Secondary | ICD-10-CM | POA: Diagnosis not present

## 2022-03-29 MED ORDER — ESZOPICLONE 1 MG PO TABS: 1.0000 mg | ORAL_TABLET | Freq: Every evening | ORAL | 2 refills | Status: DC | PRN

## 2022-03-29 NOTE — Progress Notes (Signed)
DISCONTINUE ON PATHWAY REGIMEN - Colorectal     A cycle is every 21 days:     Capecitabine      Bevacizumab-xxxx      Oxaliplatin   **Always confirm dose/schedule in your pharmacy ordering system**  REASON: Disease Progression PRIOR TREATMENT: [id number]: CapeOx + Bevacizumab q21 Days TREATMENT RESPONSE: Partial Response (PR)  START ON PATHWAY REGIMEN - Colorectal     A cycle is every 14 days:     Bevacizumab-xxxx      Irinotecan      Leucovorin      Fluorouracil      Fluorouracil   **Always confirm dose/schedule in your pharmacy ordering system**  Patient Characteristics: Distant Metastases, Nonsurgical Candidate, KRAS/NRAS Mutation Positive/Unknown (BRAF V600 Wild-Type/Unknown), Standard Cytotoxic Therapy, Second Line Standard Cytotoxic Therapy, Bevacizumab Eligible Tumor Location: Colon Therapeutic Status: Distant Metastases Microsatellite/Mismatch Repair Status: MSS/pMMR BRAF Mutation Status: Wild-Type (no mutation) KRAS/NRAS Mutation Status: Mutation Positive Preferred Therapy Approach: Standard Cytotoxic Therapy Standard Cytotoxic Line of Therapy: Second Line Standard Cytotoxic Therapy Bevacizumab Eligibility: Eligible Intent of Therapy: Non-Curative / Palliative Intent, Discussed with Patient

## 2022-03-29 NOTE — Progress Notes (Addendum)
[place]   Telephone:([phone]) [phone] [phone]) [phone]   Clinic Follow up Note   Patient Care Team: System, Provider Not In as PCP - General [name], MD as Consulting Physician (Cardiology) [name], MD as Consulting Physician (Gastroenterology) [name], MD as Consulting Physician (General Surgery) [name], MD as Consulting Physician (General Surgery) [name], MD as Consulting Physician (Urology) [name], MD as Consulting Physician (Medical Oncology)  Date of Service:  03/29/2022  I connected with Hetty Ely on 03/29/2022 at  9:20 AM EDT by telephone visit and verified that I am speaking with the correct person using two identifiers.  I discussed the limitations, risks, security and privacy concerns of performing an evaluation and management service by telephone and the availability of in person appointments. I also discussed with the patient that there may be a patient responsible charge related to this service. The patient expressed understanding and agreed to proceed.   Other persons participating in the visit and their role in the encounter:  pt's wife Sharee Pimple  Patient's location:  home Provider's location:  my office  CHIEF COMPLAINT: f/u of metastatic colon cancer  CURRENT THERAPY:  Pending restarting FOLFIRI/beva  ASSESSMENT & PLAN:  Dave Vaughn is a 76 y.o. male with   1. Cancer of left colon, adenocarcinoma, stage IIIB (pT3N1cM0), MSI-stable, KRAS Q61H mutation (+), liver and peritoneal metastasis in 12/2019  -Diagnosed in 01/2018. S/p surgery and adjuvant FOLFOX. He did not tolerate well and declined additional treatment at that time. -restaging in 12/2019 revealed local recurrence in left abdominal wall and peritoneal metastasis, 2 hypermetabolic liver mets. Abdominal wall biopsy confirmed metastatic colorectal adenocarcinoma in 01/2020  -FO showed KRAS mutation (+), he is not a candidate for EGFR  inhibitor.   -received 8 cycles FOLFIRI 01/28/20 - 05/14/20, Bevacizumab added with cycle 2. CEA normalized after cycle 4. Switched to maintenance xeloda and beva 06/02/20. Beva held 01/13/21 - 06/10/21 due to high proteinuria -abdominal scar excision on 05/05/21 under Dr. Rosendo Gros. Pathology revealed adenocarcinoma.  -due to progressive disease, he received oxaliplatin and bevacizumab on 06/11/21 but tolerated poorly with a number of side effects. -he restarted bevacizumab on 08/12/21 and Xeloda on 08/13/21. He tolerated well -he was again found to have progressive disease by CT and rising CEA and restarted oxaliplatin on 12/17/21 at lower dose.  -he met with Dr. Estelle Grumbles at [facility] on 12/24/21, who recommended against radiation to the abdominal wall and to continue oxaliplatin for several more cycles. -restaging CT CAP 03/18/22 showed progressive disease in lungs, liver, and soft tissue to anterior abdominal wall. -I have reviewed his recent scan and recommend changing his treatment. I discussed the option of FOLFIRI, or irinotecan along if he is not willing to take 5-FU pump, and continue bevacizumab. he also saw Dr. Estelle Grumbles on 03/25/22. The recommendation is to restart FOLFIRI with continued beva.  He has agreed. -he reports he is going out of town tomorrow, thus we will delay his first cycle until 04/14/22.  I again encouraged him to follow treatment schedule.    2. Left side abdominal pain for peritoneal metastasis -Occurs when he stands or walks for 5 to 10 minutes, likely secondary to chemo deposits in the previous surgical site -He has palpable hard nodules around the incision -he declined palliative radiation per recommendation by Dr. Estelle Grumbles. -he accepted prescription for oxycodone when offered by Dr. Estelle Grumbles, will continue pain management there.     PLAN: -I refilled lunesta -lab,  flush, f/u, and FOLFIRI/beva on 04/14/2022, he will be out of town until 11/6.  I will slightly reduce irinotecan dose to 160  mg/m due to tolerance issues.   No problem-specific Assessment & Plan notes found for this encounter.   SUMMARY OF ONCOLOGIC HISTORY: Oncology History Overview Note   Cancer Staging  Cancer of left colon [facility]) Staging form: Colon and Rectum, AJCC 8th Edition - Pathologic stage from 01/11/2018: Stage IIIB (pT3, pN1c, cM0) - Signed by [name], MD on 01/16/2018 Total positive nodes: 0 Histologic grading system: 4 grade system Histologic grade (G): G2     Cancer of left colon ([place])  01/10/2018 Imaging   CT AP W Contrast 01/10/18  IMPRESSION: Irregular soft tissue density causing stricture of the mid descending colon likely the site of obstruction for the dilated small bowel. 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 No evidence of perforation.   Equivocal findings involving the appendix measuring 1.2 cm at the appendiceal tip with mucosal enhancement. No adjacent free fluid or inflammatory change. Findings are nonspecific, but can be seen with early acute appendicitis.   Mild prostatic enlargement. Increased density over the posterior bladder base likely due to the large prostatic impression although cannot completely exclude a bladder mass. Urology protocol CT or ultrasound may be helpful for better evaluation.   Mild cholelithiasis.   Stable 1.5 cm cystic structure over the lower pole right kidney likely slightly hyperdense cyst.   Diverticulosis of the colon.   Aortic Atherosclerosis (ICD10-I70.0).   01/11/2018 Cancer Staging   Staging form: Colon and Rectum, AJCC 8th Edition - Pathologic stage from 01/11/2018: Stage IIIB (pT3, pN1c, cM0) - Signed by [name], MD on 01/16/2018   01/11/2018 Surgery   LEFT COLON RESECTION, TAKEDOWN SPLENIC FLEXURE, COLOSTOMY by Dr. [name]    01/11/2018 Procedure   Colonoscopy 01/11/18 by Dr. [name]  - Malignant completely obstructing tumor in the mid descending colon. Tattooed. - Diverticulosis in the sigmoid colon. - Non-bleeding internal  hemorrhoids. - No specimens collected.   01/11/2018 Pathology Results   Diagnosis 01/11/18  1. Colon, segmental resection for tumor, descending colon - INVASIVE COLORECTAL ADENOCARCINOMA, 4 CM. - TUMOR EXTENDS INTO PERICOLONIC CONNECTIVE TISSUE. - TUMOR FOCALLY INVOLVES RADIAL MARGIN. - ONE MESENTERIC TUMOR DEPOSIT. - THIRTEEN BENIGN LYMPH NODES (0/13). 2. Colon, segmental resection, splenic flexure - BENIGN COLON. - NO EVIDENCE OF MALIGNANCY .   01/11/2018 Tumor Marker   Baseline CEA at 3.4   01/16/2018 Initial Diagnosis   Cancer of left colon ([place])   01/23/2018 Imaging   CT CHEST WO CONTRAST IMPRESSION: 1. No evidence for metastatic disease within the chest. 2. Small left pleural effusion with underlying opacities which may represent atelectasis. Right basilar atelectasis. 3. Few foci of gas within the upper abdomen in the omentum with surrounding fat stranding, likely postsurgical 4. Aortic Atherosclerosis (ICD10-I70.0).   03/08/2018 - 05/15/2018 Chemotherapy   adjuvant FOLOFX. Due to side effects of neuropahty Oxaliplatin was stopped after 3 cycles and chemo was stopped after 6 cycles. He declined completing 6 months of chemo treatment.     03/19/2018 Imaging   03/19/2018 CT AP IMPRESSION: 1. Interval partial left hemicolectomy and descending colostomy. 2. Heterogeneous soft tissue density along the left anterior renal fascia is most likely postoperative (favor fat necrosis). No well-defined fluid collection. 3. Mild left lower quadrant edema, new since 01/10/2018. This could be postoperative. Superimposed sigmoid diverticulitis and/or cystitis cannot be excluded. 4. Subtle hyperenhancing nodule within the anterior bladder dome cannot be excluded. Consider  nonemergent cystoscopy. When this is performed, recommend attention to the left ureterovesicular junction and distal left ureter to evaluate questionable soft tissue fullness. 5. Cholelithiasis. 6.  Aortic  Atherosclerosis (ICD10-I70.0). 7. Prostatomegaly.   11/13/2018 Imaging   CT CAP WO Contrast 11/13/18  IMPRESSION: 1. Reversal of left lower quadrant colostomy with sigmoid colon anastomosis. No complicating features. No findings for residual or recurrent tumor or metastatic disease involving the chest, abdomen or pelvis without contrast. 2. No acute abdominal/pelvic findings. 3. Gallbladder sludge and gallstones but no findings for acute cholecystitis. 4. Stable anterior abdominal wall hernia. 5. The right testicle is in the right inguinal canal.   10/03/2019 Imaging   CT CAP WO contrast  IMPRESSION: 1. New rounded density interposed between the prostate gland and anterior upper rectal wall could represent adenopathy or local extension of anterior rectal tumor. 2. Marked prostatomegaly, prostate volume 150 cubic cm. 3. Other imaging findings of potential clinical significance: Aortic Atherosclerosis (ICD10-I70.0). Coronary atherosclerosis. Trace right pleural effusion. Suspected cholelithiasis. Nonobstructive left nephrolithiasis. Multilevel lumbar impingement. Bilateral mildly retracted testicles. Hypodense exophytic lesion of the right kidney, most likely to be a cyst.   11/02/2019 Procedure   colonoscopy on 11/02/2019 by Dr. [name] Plan showed normal digital rectal exam, 3 polyps in the rectum, descending colon and cecum, and a prior sigmoid: Anastomosis characterized by erythema.  He found an extrinsic nonobstructing medium-sized mass in the proximal rectum about 4 cm in length, no internal rectal mass.   Diagnosis Surgical [P], colon, cecum, descending, rectal, polyp (3) - TUBULAR ADENOMA (TWO) - NO HIGH GRADE DYSPLASIA OR CARCINOMA. - COLONIC FRAGMENT WITH BENIGN LYMPHOID AGGREGATE.   12/07/2019 Imaging   MRI pelvis IMPRESSION: 1. Masslike area in the rectum suspicious for rectal neoplasm, likely T4b based on the appearance of soft tissue extending along the anterior peritoneal  reflection and into the seminal vesicles. Correlation with recent colonoscopy results may be helpful. Area of anastomosis and other areas of the pelvis are not imaged on today's exam.   12/21/2019 PET scan   IMPRESSION: 1. Unfortunately evidence for peritoneal metastasis. Intensely hypermetabolic nodules along the LEFT pericolic gutter. Favor hypermetabolic mass anterior to the rectum to represent serosal implant along the ventral surface of the rectum. 2. local recurrence within the LEFT abdominal wall at site prior colostomy. Intense hypermetabolic thickening of the rectus muscle at this site. 3. Two hypermetabolic hepatic metastasis.   01/15/2020 Relapse/Recurrence   FINAL MICROSCOPIC DIAGNOSIS:   A. SOFT TISSUE, LEFT ABDOMINAL WALL, BIOPSY:  - Adenocarcinoma.  - See comment.   COMMENT:   The morphology is consistent with metastatic colorectal adenocarcinoma.    01/28/2020 -  Chemotherapy   First line FOLFIRI q2weeks starting 01/28/20 for 8 cycles.  -----Bevacizumab added with C2.  -----Changed to maintenance Xeloda 2000 mg twice daily for 2 weeks on/1 week off and bevacizumab 06/02/20. Starting with C3, dose reduce to 1562m BID due to skin toxicity.   05/05/2020 Imaging   IMPRESSION: 1. Improved appearance, with reduced size of the hepatic metastatic lesions and reduced size of the peritoneal tumor implants. 2. Other imaging findings of potential clinical significance: Notable prostatomegaly. Multilevel impingement in the lumbar spine. Dependent density in the gallbladder possibly from sludge or gallstones. Degenerative glenohumeral arthropathy bilaterally. 3. Aortic atherosclerosis.   08/15/2020 Imaging   CT CAP  IMPRESSION: Chest Impression:   No evidence of thoracic metastasis   Abdomen / Pelvis Impression:   1. Hepatic metastasis are no longer measurable by CT imaging. 2. Peritoneal nodular  metastasis in the LEFT abdomen are decreased in size. 3. No evidence of  new peritoneal disease. 4. Thickening and LEFT rectus muscle at site of prior metastasis. No interval change. 5. No evidence of new or progressive colorectal carcinoma.     11/27/2020 Imaging   CT CAP  IMPRESSION: 1. There are multiple small pulmonary nodules in the right upper lobe that are new or enlarged compared to prior examination but measuring 4 mm or smaller, suspicious for pulmonary metastatic disease. 2. Unchanged peritoneal nodule adjacent to the tip of the spleen measuring 1.3 x 1.2 cm. Slightly peritoneal nodule adjacent to the splenic flexure measuring no greater than 6 mm, previously 8 mm. 3. No significant change in previously hypermetabolic soft tissue mass centered about the left lower quadrant colostomy site. 4. Previously established hypermetabolic hepatic metastatic disease remains inapparent by CT. 5. Status post sigmoid colon resection and reanastomosis. 6. Prostatomegaly with thickening of the decompressed urinary bladder, likely secondary to chronic outlet obstruction. 7. Cholelithiasis.   Aortic Atherosclerosis (ICD10-I70.0).   01/29/2021 Imaging   CT CAP   IMPRESSION: 1. Minimal interval progression of bilateral pulmonary nodules, concerning for metastatic disease. 2. No substantial change in size of the peritoneal nodule at the inferior tip of the spleen and splenic flexure nodule. 3. Nodular soft tissue measured at the ostomy site previously is not evident today. 4. Marked prostatomegaly. 5. Cholelithiasis. 6. Aortic Atherosclerosis (ICD10-I70.0).   04/06/2021 Imaging   EXAM: CT ABDOMEN AND PELVIS WITHOUT CONTRAST (Renal Stone Protocol)  IMPRESSION: 1. Bladder wall thickening with surrounding inflammation concerning for cystitis. 2. Partial colectomy. Colon is air-filled with relative transition just proximal to the anastomosis. Stricture or recurrence at this level would be difficult to exclude. No focal mass identified. 3. There is a new  subcutaneous fluid collection at the level of prior ostomy in the left abdominal wall. Infection not excluded. 4. There is new intramuscular hyperdensity at the level of the prior ostomy, indeterminate. Findings may related to scarring, tumor recurrence or metastatic disease is not excluded. 5. Stable nodularity in the left upper quadrant. 6. Cholelithiasis. 7. Trace right pleural effusion. 8. Stable prostatomegaly. 9.  Aortic Atherosclerosis (ICD10-I70.0).   05/05/2021 Pathology Results   FINAL MICROSCOPIC DIAGNOSIS:   A. ABDOMINAL WALL SCAR, EXCISION:  -  Adenocarcinoma  -  See comment   COMMENT:  Morphologically consistent with colonic adenocarcinoma (similar to  previously reported ([id number]).   06/02/2021 Imaging   EXAM: CT CHEST, ABDOMEN, AND PELVIS WITH CONTRAST  IMPRESSION: 1. Progressive multifocal pulmonary metastatic disease. 2. New low-density hepatic lesions, likely metastases. 3. Progressive peritoneal implant near the splenic flexure of the colon. Small peritoneal implant inferior to the spleen appears unchanged. 4. Increased soft tissue nodularity near the midline incision in the upper anterior abdominal wall, suspicious for tumor recurrence at the incision. This could reflect keloid formation. 5. Fluid collection in the left anterior abdominal wall attributed to recent abdominal surgery with associated small incisional hernia containing fat. 6. Additional incidental findings including prostatomegaly, cholelithiasis and Aortic Atherosclerosis (ICD10-I70.0).   06/11/2021 - 11/26/2021 Chemotherapy   Patient is on Treatment Plan : COLORECTALXeloda + Bevacizumab q21d     07/29/2021 Imaging   EXAMINATION: CT ABDOMEN PELVIS WO CONTRAST  Impression  1. Left anterior abdominal wall postsurgical changes with no recurrent hernia.  2. Stable indeterminate 3 cm ventral abdominal wall mass located at the L1-2 level. Question scarring from prior surgery versus  neoplasm.  3. Cholelithiasis.  4. Stable indeterminate bibasilar lung  nodules. All of with a CT chest is recommended.  5. Indeterminate stable right hepatic lesions. Consider follow-up with an abdominal MRI with and without contrast for additional characterization.     12/17/2021 - 02/01/2022 Chemotherapy   Patient is on Treatment Plan : COLORECTAL CapeOx + Bevacizumab q21d     12/17/2021 -  Chemotherapy   Patient is on Treatment Plan : COLORECTAL CapeOx + Bevacizumab q21d        INTERVAL HISTORY:  Dave Vaughn was contacted for a follow up of metastatic colon cancer. He was last seen by NP Lacie on 03/17/22. He reports his pain is well controlled and somewhat improved.    All other systems were reviewed with the patient and are negative.  MEDICAL HISTORY:  Past Medical History:  Diagnosis Date   Adenocarcinoma, colon ([place]) dx'd 01/2018   Anemia    taking iron supplements   Anxiety    Atrial fibrillation with RVR (HCC)    Colonic obstruction ([place]) 01/10/2018   Depression    Diverticulitis    Dysrhythmia    afib   History of kidney stones    Hypertension    Indwelling Foley catheter present    due to UTI    SURGICAL HISTORY: Past Surgical History:  Procedure Laterality Date   ANKLE SURGERY Left    when he was in college   COLON RESECTION N/A 01/11/2018   Procedure: LEFT COLON RESECTION, TAKEDOWN SPLENIC FLEXURE, COLOSTOMY;  Surgeon: [name], MD;  Location: [facility];  Service: General;  Laterality: N/A;   COLONOSCOPY  01/11/2018   Procedure: COLONOSCOPY;  Surgeon: Jackquline Denmark, MD;  Location: [facility];  Service: Endoscopy;;   COLONOSCOPY  05/10/2018   colonscopy  05/10/2018   EXCISION OF KELOID N/A 05/05/2021   Procedure: ABDOMINAL SCAR EXCISION;  Surgeon: Ralene Ok, MD;  Location: [place];  Service: General;  Laterality: N/A;   FOREIGN BODY REMOVAL ABDOMINAL N/A 03/09/2021   Procedure: EXCISION OF FOREIGN BODY;  Surgeon: Ralene Ok, MD;  Location: [place];   Service: General;  Laterality: N/A;   HERNIA REPAIR     HERNIA REPAIR  03/02/2019   EXPLORATORY LAPAROTOMY (N/A Abdomen)   INCISIONAL HERNIA REPAIR N/A 03/02/2019   Procedure: INCISIONAL HERNIA REPAIR , RECTORECTUS VS TAR HERNIA REPAIR;  Surgeon: Ralene Ok, MD;  Location: [place];  Service: General;  Laterality: N/A;   INSERTION OF MESH N/A 03/02/2019   Procedure: Insertion Of Mesh;  Surgeon: Ralene Ok, MD;  Location: [place];  Service: General;  Laterality: N/A;   LAPAROTOMY N/A 03/02/2019   Procedure: EXPLORATORY LAPAROTOMY;  Surgeon: Ralene Ok, MD;  Location: [place];  Service: General;  Laterality: N/A;   LAPAROTOMY N/A 03/09/2021   Procedure: EXPLORATION OF ABDOMINAL WALL;  Surgeon: Ralene Ok, MD;  Location: [place];  Service: General;  Laterality: N/A;   LYSIS OF ADHESION N/A 06/12/2018   Procedure: LYSIS OF ADHESIONS;  Surgeon: [name], MD;  Location: [facility];  Service: General;  Laterality: N/A;   LYSIS OF ADHESION N/A 03/02/2019   Procedure: Lysis Of Adhesion;  Surgeon: Ralene Ok, MD;  Location: [place];  Service: General;  Laterality: N/A;   PORTACATH PLACEMENT Right 03/07/2018   Procedure: INSERTION PORT-A-CATH RIGHT SUBCLAVIAN;  Surgeon: [name], MD;  Location: [place];  Service: General;  Laterality: Right;   PROCTOSCOPY N/A 06/12/2018   Procedure: RIGID PROCTOSCOPY;  Surgeon: [name], MD;  Location: [facility];  Service: General;  Laterality: N/A;   thumb surgery  2018   cyst removal    I have reviewed the social history and family history with the patient and they are unchanged from previous note.  ALLERGIES:  has No Known Allergies.  MEDICATIONS:  Current Outpatient Medications  Medication Sig Dispense Refill   apixaban (ELIQUIS) 5 MG TABS tablet TAKE 1 TABLET(5 MG) BY MOUTH TWICE DAILY 60 tablet 5   b complex vitamins tablet Take 1 tablet by mouth daily.     capecitabine (XELODA) 500 MG tablet TAKE 3 TABLETS BY MOUTH EVERY 12 HOURS AFTER  A MEAL FOR 14 DAYS  ON THEN 7 DAYS OFF 84 tablet 1   diltiazem (CARDIZEM CD) 360 MG 24 hr capsule TAKE 1 CAPSULE(360 MG) BY MOUTH DAILY 90 capsule 3   eszopiclone (LUNESTA) 1 MG TABS tablet Take 1 tablet (1 mg total) by mouth at bedtime as needed for sleep. Take immediately before bedtime 30 tablet 0   finasteride (PROSCAR) 5 MG tablet Take 5 mg by mouth daily.     levothyroxine (SYNTHROID) 75 MCG tablet TAKE 1 TABLET(75 MCG) BY MOUTH DAILY 90 tablet 1   lidocaine-prilocaine (EMLA) cream Apply 1 application topically as needed. 30 g 1   Multiple Vitamins-Iron (MULTIVITAMIN/IRON PO) Take 1 tablet by mouth daily.      tamsulosin (FLOMAX) 0.4 MG CAPS capsule Take 0.4 mg by mouth 2 (two) times daily.      No current facility-administered medications for this visit.    PHYSICAL EXAMINATION: ECOG PERFORMANCE STATUS: 1 - Symptomatic but completely ambulatory  There were no vitals filed for this visit. Wt Readings from Last 3 Encounters:  03/17/22 224 lb 1.6 oz (101.7 kg)  02/24/22 222 lb 6.4 oz (100.9 kg)  02/01/22 220 lb 8 oz (100 kg)     No vitals taken today, Exam not performed today  LABORATORY DATA:  I have reviewed the data as listed    Latest Ref Rng & Units 03/17/2022    8:44 AM 02/24/2022   11:13 AM 02/01/2022   11:21 AM  CBC  WBC 4.0 - 10.5 K/uL 5.6  9.0  6.1   Hemoglobin 13.0 - 17.0 g/dL 14.0  14.0  14.7   Hematocrit 39.0 - 52.0 % 40.3  40.3  41.8   Platelets 150 - 400 K/uL 208  189  185         Latest Ref Rng & Units 03/17/2022    8:44 AM 02/24/2022   11:13 AM 02/01/2022   11:21 AM  CMP  Glucose 70 - 99 mg/dL 132  135  118   BUN 8 - 23 mg/dL _0 Creatinine 0.61 - 1.24 mg/dL 1.12  1.08  1.07   Sodium 135 - 145 mmol/L 140  139  138   Potassium 3.5 - 5.1 mmol/L 3.7  3.6  4.0   Chloride 98 - 111 mmol/L 105  106  103   CO2 22 - 32 mmol/L _1 Calcium 8.9 - 10.3 mg/dL 8.9  8.6  9.4   Total Protein 6.5 - 8.1 g/dL 6.8  6.4  6.9   Total Bilirubin 0.3 -  1.2 mg/dL 1.0  1.2  1.0   Alkaline Phos 38 - 126 U/L 115  117  105   AST 15 - 41 U/L _2 ALT 0 - 44 U/L _3 RADIOGRAPHIC STUDIES: I have personally reviewed the  radiological images as listed and agreed with the findings in the report. No results found.    No orders of the defined types were placed in this encounter.  All questions were answered. The patient knows to call the clinic with any problems, questions or concerns. No barriers to learning was detected. The total time spent in the appointment was 30 minutes.     [name], MD 03/29/2022   I, Wilburn Mylar, am acting as scribe for [name], MD.   I have reviewed the above documentation for accuracy and completeness, and I agree with the above.

## 2022-03-29 NOTE — Telephone Encounter (Signed)
Received call from pt's wife that pt had telephone visit with Dr Burr Medico & she refilled his Johnnye Sima but [facility] says it is too early.  Called [facility] & informed pharmacy of [place] to refill early per Dr Burr Medico since pt was going out of town.

## 2022-03-31 ENCOUNTER — Other Ambulatory Visit: Payer: Self-pay

## 2022-04-01 ENCOUNTER — Telehealth: Payer: Self-pay | Admitting: Hematology

## 2022-04-01 DIAGNOSIS — C189 Malignant neoplasm of colon, unspecified: Secondary | ICD-10-CM | POA: Diagnosis not present

## 2022-04-01 NOTE — Telephone Encounter (Signed)
Left patient a voicemail regarding upcoming appointments  

## 2022-04-02 ENCOUNTER — Other Ambulatory Visit: Payer: Self-pay

## 2022-04-07 ENCOUNTER — Ambulatory Visit: Payer: BC Managed Care – PPO

## 2022-04-07 ENCOUNTER — Ambulatory Visit: Payer: BC Managed Care – PPO | Admitting: Hematology

## 2022-04-07 ENCOUNTER — Other Ambulatory Visit: Payer: BC Managed Care – PPO

## 2022-04-07 ENCOUNTER — Other Ambulatory Visit: Payer: Self-pay

## 2022-04-08 NOTE — Progress Notes (Signed)
Pharmacist Chemotherapy Monitoring - Initial Assessment    Anticipated start date: 04/15/22   The following has been reviewed per standard work regarding the patient's treatment regimen: The patient's diagnosis, treatment plan and drug doses, and organ/hematologic function Lab orders and baseline tests specific to treatment regimen  The treatment plan start date, drug sequencing, and pre-medications Prior authorization status  Patient's documented medication list, including drug-drug interaction screen and prescriptions for anti-emetics and supportive care specific to the treatment regimen The drug concentrations, fluid compatibility, administration routes, and timing of the medications to be used The patient's access for treatment and lifetime cumulative dose history, if applicable  The patient's medication allergies and previous infusion related reactions, if applicable   Changes made to treatment plan:  N/A  Follow up needed:  N/A   Larene Beach, RPH, 04/08/2022  2:41 PM

## 2022-04-09 ENCOUNTER — Other Ambulatory Visit: Payer: Self-pay

## 2022-04-09 NOTE — Progress Notes (Signed)
LVM on triage line for lab results for Guardant 360.  Pt had Guardant 360 drawn while seeing Dr. Estelle Grumbles at [facility] on 03/25/2022.  Requested the results of the [place] 360 be faxed to Dr. Ernestina Penna office. Awaiting their response.  [phone]

## 2022-04-12 ENCOUNTER — Encounter: Payer: Self-pay | Admitting: Hematology

## 2022-04-12 ENCOUNTER — Other Ambulatory Visit: Payer: Self-pay | Admitting: *Deleted

## 2022-04-12 DIAGNOSIS — E039 Hypothyroidism, unspecified: Secondary | ICD-10-CM

## 2022-04-13 ENCOUNTER — Other Ambulatory Visit: Payer: Self-pay

## 2022-04-14 MED FILL — Dexamethasone Sodium Phosphate Inj 100 MG/10ML: INTRAMUSCULAR | Qty: 1 | Status: AC

## 2022-04-15 ENCOUNTER — Other Ambulatory Visit: Payer: BC Managed Care – PPO

## 2022-04-15 ENCOUNTER — Ambulatory Visit: Payer: BC Managed Care – PPO | Admitting: Hematology

## 2022-04-15 ENCOUNTER — Ambulatory Visit: Payer: BC Managed Care – PPO

## 2022-04-15 ENCOUNTER — Other Ambulatory Visit: Payer: Self-pay

## 2022-04-15 ENCOUNTER — Other Ambulatory Visit: Payer: Self-pay | Admitting: Hematology

## 2022-04-15 ENCOUNTER — Telehealth: Payer: Self-pay

## 2022-04-15 MED ORDER — MOLNUPIRAVIR EUA 200MG CAPSULE: 4.0000 | ORAL_CAPSULE | Freq: Two times a day (BID) | ORAL | 0 refills | Status: AC

## 2022-04-15 MED ORDER — PAXLOVID (300/100) 20 X 150 MG & 10 X 100MG PO TBPK: 3.0000 | ORAL_TABLET | Freq: Two times a day (BID) | ORAL | 0 refills | Status: DC

## 2022-04-15 NOTE — Progress Notes (Signed)
Spoke with Dave Vaughn oral chemo pharmacist regarding pt's Paxlovid and Eliquis interaction.  [name] stated that the pt can still take both medications but the pt will need to take 2.'5mg'$  of Eliquis BID while taking the Paxlovid.  Spoke with pt to inform him how to take the Eliquis while on Paxlovid.  Pt verbalized understanding of instructions.

## 2022-04-15 NOTE — Telephone Encounter (Signed)
Spoke with pharmacist with [facility] via telephone regarding Paxlovid prescription from Dr. Burr Medico.  Pharmacist stated that the Paxlovid has interactions with pt's Eliquis, Lunesta, and Proscar.  Informed pharmacist that pt was instructed to half his Eliquis dose and take it BID.  Pharmacist stated the Paxlovid increases the pt's Lunesta effects and Proscar.  The [facility] Pharmacist recommended Lagevrio [facility]).  Spoke with Dr. Burr Medico regarding the recommendation for Lagevrio.  Verbal order given by Dr. Burr Medico for Lagevrio '20mg'$  PO BID for 5 days.  Discontinue Paxlovid prescription.  Notified [facility] Pharmacist of the new order for Lagevrio and to discontinue Paxlovid prescription.

## 2022-04-15 NOTE — Telephone Encounter (Signed)
Spoke with pt via telephone regarding his recent positive Covid test.  Pt stated he is currently not taking anything for his Covid symptoms; therefore is requesting if he could prescribe something.  Notified Dr. Burr Medico of the pt's request.

## 2022-04-15 NOTE — Telephone Encounter (Signed)
Spoke with pt via telephone regarding when his symptoms first started.  Pt stated his symptoms started yesterday and he started having fevers this morning.

## 2022-04-21 ENCOUNTER — Other Ambulatory Visit: Payer: Self-pay

## 2022-04-22 ENCOUNTER — Other Ambulatory Visit: Payer: Self-pay

## 2022-04-24 ENCOUNTER — Other Ambulatory Visit: Payer: Self-pay

## 2022-04-27 ENCOUNTER — Ambulatory Visit: Payer: BC Managed Care – PPO

## 2022-04-27 ENCOUNTER — Other Ambulatory Visit: Payer: BC Managed Care – PPO

## 2022-04-27 ENCOUNTER — Other Ambulatory Visit: Payer: Self-pay

## 2022-04-27 ENCOUNTER — Other Ambulatory Visit: Payer: Self-pay | Admitting: Hematology

## 2022-04-27 ENCOUNTER — Ambulatory Visit: Payer: BC Managed Care – PPO | Admitting: Hematology

## 2022-04-27 ENCOUNTER — Telehealth: Payer: Self-pay

## 2022-04-27 MED ORDER — OXYCODONE HCL 10 MG PO TABS: 10.0000 mg | ORAL_TABLET | Freq: Four times a day (QID) | ORAL | 0 refills | Status: DC | PRN

## 2022-04-27 NOTE — Telephone Encounter (Signed)
Patients wife called asking about a refill of Oxycontin '10mg'$  that is prescribed by [facility] and they instructed her to get it filled by her local provider because they only see him every 3 months. His next appointment is Friday 11-24 for a pump start. He will be out of the pain med on Monday 11-27.

## 2022-04-30 ENCOUNTER — Inpatient Hospital Stay: Payer: BC Managed Care – PPO

## 2022-05-03 ENCOUNTER — Other Ambulatory Visit: Payer: Self-pay

## 2022-05-03 ENCOUNTER — Telehealth: Payer: Self-pay

## 2022-05-03 ENCOUNTER — Other Ambulatory Visit: Payer: Self-pay | Admitting: Hematology

## 2022-05-03 DIAGNOSIS — C186 Malignant neoplasm of descending colon: Secondary | ICD-10-CM

## 2022-05-03 DIAGNOSIS — F4323 Adjustment disorder with mixed anxiety and depressed mood: Secondary | ICD-10-CM

## 2022-05-03 MED ORDER — ALPRAZOLAM 0.25 MG PO TABS: 0.2500 mg | ORAL_TABLET | Freq: Every evening | ORAL | 0 refills | Status: DC | PRN

## 2022-05-03 NOTE — Telephone Encounter (Signed)
Pt's wife Sharee Pimple called stating that the pt has been having increased anxiety and pt & Sharee Pimple would like to speak with Dr. Burr Medico about the management of the pt's increasing anxiety.  Notified Dr. Burr Medico of the pt's request.

## 2022-05-05 DIAGNOSIS — R338 Other retention of urine: Secondary | ICD-10-CM | POA: Diagnosis not present

## 2022-05-05 DIAGNOSIS — R351 Nocturia: Secondary | ICD-10-CM | POA: Diagnosis not present

## 2022-05-05 DIAGNOSIS — N401 Enlarged prostate with lower urinary tract symptoms: Secondary | ICD-10-CM | POA: Diagnosis not present

## 2022-05-11 ENCOUNTER — Other Ambulatory Visit: Payer: BC Managed Care – PPO

## 2022-05-11 ENCOUNTER — Ambulatory Visit: Payer: BC Managed Care – PPO | Admitting: Physician Assistant

## 2022-05-11 ENCOUNTER — Ambulatory Visit: Payer: BC Managed Care – PPO

## 2022-05-11 NOTE — Progress Notes (Unsigned)
Dave Vaughn   [phone] [phone]   Clinic Follow up Note   Patient Care Team: System, Provider Not In as PCP - General [name], MD as Consulting Physician (Cardiology) [name], MD as Consulting Physician (Gastroenterology) [name], MD as Consulting Physician (General Surgery) [name], MD as Consulting Physician (General Surgery) [name], MD as Consulting Physician (Urology) [name], MD as Consulting Physician (Medical Oncology)  Date of Service:  05/11/2022  CHIEF COMPLAINT: f/u of  metastatic colon cancer    CURRENT THERAPY:  Pending restarting FOLFIRI/beva  ASSESSMENT: *** Dave Vaughn is a 76 y.o. male with   No problem-specific Assessment & Plan notes found for this encounter.  ***   PLAN:   SUMMARY OF ONCOLOGIC HISTORY: Oncology History Overview Note   Cancer Staging  Cancer of left colon [facility]) Staging form: Colon and Rectum, AJCC 8th Edition - Pathologic stage from 01/11/2018: Stage IIIB (pT3, pN1c, cM0) - Signed by [name], MD on 01/16/2018 Total positive nodes: 0 Histologic grading system: 4 grade system Histologic grade (G): G2     Cancer of left colon ([place])  01/10/2018 Imaging   CT AP W Contrast 01/10/18  IMPRESSION: Irregular soft tissue density causing stricture of the mid descending colon likely the site of obstruction for the dilated small bowel. 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 No evidence of perforation.   Equivocal findings involving the appendix measuring 1.2 cm at the appendiceal tip with mucosal enhancement. No adjacent free fluid or inflammatory change. Findings are nonspecific, but can be seen with early acute appendicitis.   Mild prostatic enlargement. Increased density over the posterior bladder base likely due to the large prostatic impression although cannot completely exclude a bladder mass. Urology protocol CT or ultrasound may be helpful  for better evaluation.   Mild cholelithiasis.   Stable 1.5 cm cystic structure over the lower pole right kidney likely slightly hyperdense cyst.   Diverticulosis of the colon.   Aortic Atherosclerosis (ICD10-I70.0).   01/11/2018 Cancer Staging   Staging form: Colon and Rectum, AJCC 8th Edition - Pathologic stage from 01/11/2018: Stage IIIB (pT3, pN1c, cM0) - Signed by [name], MD on 01/16/2018   01/11/2018 Surgery   LEFT COLON RESECTION, TAKEDOWN SPLENIC FLEXURE, COLOSTOMY by Dr. [name]    01/11/2018 Procedure   Colonoscopy 01/11/18 by Dr. [name]  - Malignant completely obstructing tumor in the mid descending colon. Tattooed. - Diverticulosis in the sigmoid colon. - Non-bleeding internal hemorrhoids. - No specimens collected.   01/11/2018 Pathology Results   Diagnosis 01/11/18  1. Colon, segmental resection for tumor, descending colon - INVASIVE COLORECTAL ADENOCARCINOMA, 4 CM. - TUMOR EXTENDS INTO PERICOLONIC CONNECTIVE TISSUE. - TUMOR FOCALLY INVOLVES RADIAL MARGIN. - ONE MESENTERIC TUMOR DEPOSIT. - THIRTEEN BENIGN LYMPH NODES (0/13). 2. Colon, segmental resection, splenic flexure - BENIGN COLON. - NO EVIDENCE OF MALIGNANCY .   01/11/2018 Tumor Marker   Baseline CEA at 3.4   01/16/2018 Initial Diagnosis   Cancer of left colon ([place])   01/23/2018 Imaging   CT CHEST WO CONTRAST IMPRESSION: 1. No evidence for metastatic disease within the chest. 2. Small left pleural effusion with underlying opacities which may represent atelectasis. Right basilar atelectasis. 3. Few foci of gas within the upper abdomen in the omentum with surrounding fat stranding, likely postsurgical 4. Aortic Atherosclerosis (ICD10-I70.0).   03/08/2018 - 05/15/2018 Chemotherapy   adjuvant FOLOFX. Due to side effects of neuropahty Oxaliplatin was stopped after 3 cycles and chemo was  stopped after 6 cycles. He declined completing 6 months of chemo treatment.     03/19/2018 Imaging   03/19/2018 CT  AP IMPRESSION: 1. Interval partial left hemicolectomy and descending colostomy. 2. Heterogeneous soft tissue density along the left anterior renal fascia is most likely postoperative (favor fat necrosis). No well-defined fluid collection. 3. Mild left lower quadrant edema, new since 01/10/2018. This could be postoperative. Superimposed sigmoid diverticulitis and/or cystitis cannot be excluded. 4. Subtle hyperenhancing nodule within the anterior bladder dome cannot be excluded. Consider nonemergent cystoscopy. When this is performed, recommend attention to the left ureterovesicular junction and distal left ureter to evaluate questionable soft tissue fullness. 5. Cholelithiasis. 6.  Aortic Atherosclerosis (ICD10-I70.0). 7. Prostatomegaly.   11/13/2018 Imaging   CT CAP WO Contrast 11/13/18  IMPRESSION: 1. Reversal of left lower quadrant colostomy with sigmoid colon anastomosis. No complicating features. No findings for residual or recurrent tumor or metastatic disease involving the chest, abdomen or pelvis without contrast. 2. No acute abdominal/pelvic findings. 3. Gallbladder sludge and gallstones but no findings for acute cholecystitis. 4. Stable anterior abdominal wall hernia. 5. The right testicle is in the right inguinal canal.   10/03/2019 Imaging   CT CAP WO contrast  IMPRESSION: 1. New rounded density interposed between the prostate gland and anterior upper rectal wall could represent adenopathy or local extension of anterior rectal tumor. 2. Marked prostatomegaly, prostate volume 150 cubic cm. 3. Other imaging findings of potential clinical significance: Aortic Atherosclerosis (ICD10-I70.0). Coronary atherosclerosis. Trace right pleural effusion. Suspected cholelithiasis. Nonobstructive left nephrolithiasis. Multilevel lumbar impingement. Bilateral mildly retracted testicles. Hypodense exophytic lesion of the right kidney, most likely to be a cyst.   11/02/2019 Procedure    colonoscopy on 11/02/2019 by Dr. [name] Plan showed normal digital rectal exam, 3 polyps in the rectum, descending colon and cecum, and a prior sigmoid: Anastomosis characterized by erythema.  He found an extrinsic nonobstructing medium-sized mass in the proximal rectum about 4 cm in length, no internal rectal mass.   Diagnosis Surgical [P], colon, cecum, descending, rectal, polyp (3) - TUBULAR ADENOMA (TWO) - NO HIGH GRADE DYSPLASIA OR CARCINOMA. - COLONIC FRAGMENT WITH BENIGN LYMPHOID AGGREGATE.   12/07/2019 Imaging   MRI pelvis IMPRESSION: 1. Masslike area in the rectum suspicious for rectal neoplasm, likely T4b based on the appearance of soft tissue extending along the anterior peritoneal reflection and into the seminal vesicles. Correlation with recent colonoscopy results may be helpful. Area of anastomosis and other areas of the pelvis are not imaged on today's exam.   12/21/2019 PET scan   IMPRESSION: 1. Unfortunately evidence for peritoneal metastasis. Intensely hypermetabolic nodules along the LEFT pericolic gutter. Favor hypermetabolic mass anterior to the rectum to represent serosal implant along the ventral surface of the rectum. 2. local recurrence within the LEFT abdominal wall at site prior colostomy. Intense hypermetabolic thickening of the rectus muscle at this site. 3. Two hypermetabolic hepatic metastasis.   01/15/2020 Relapse/Recurrence   FINAL MICROSCOPIC DIAGNOSIS:   A. SOFT TISSUE, LEFT ABDOMINAL WALL, BIOPSY:  - Adenocarcinoma.  - See comment.   COMMENT:   The morphology is consistent with metastatic colorectal adenocarcinoma.    01/28/2020 -  Chemotherapy   First line FOLFIRI q2weeks starting 01/28/20 for 8 cycles.  -----Bevacizumab added with C2.  -----Changed to maintenance Xeloda 2000 mg twice daily for 2 weeks on/1 week off and bevacizumab 06/02/20. Starting with C3, dose reduce to '1500mg'$  BID due to skin toxicity.   05/05/2020 Imaging   IMPRESSION: 1.  Improved appearance,  with reduced size of the hepatic metastatic lesions and reduced size of the peritoneal tumor implants. 2. Other imaging findings of potential clinical significance: Notable prostatomegaly. Multilevel impingement in the lumbar spine. Dependent density in the gallbladder possibly from sludge or gallstones. Degenerative glenohumeral arthropathy bilaterally. 3. Aortic atherosclerosis.   08/15/2020 Imaging   CT CAP  IMPRESSION: Chest Impression:   No evidence of thoracic metastasis   Abdomen / Pelvis Impression:   1. Hepatic metastasis are no longer measurable by CT imaging. 2. Peritoneal nodular metastasis in the LEFT abdomen are decreased in size. 3. No evidence of new peritoneal disease. 4. Thickening and LEFT rectus muscle at site of prior metastasis. No interval change. 5. No evidence of new or progressive colorectal carcinoma.     11/27/2020 Imaging   CT CAP  IMPRESSION: 1. There are multiple small pulmonary nodules in the right upper lobe that are new or enlarged compared to prior examination but measuring 4 mm or smaller, suspicious for pulmonary metastatic disease. 2. Unchanged peritoneal nodule adjacent to the tip of the spleen measuring 1.3 x 1.2 cm. Slightly peritoneal nodule adjacent to the splenic flexure measuring no greater than 6 mm, previously 8 mm. 3. No significant change in previously hypermetabolic soft tissue mass centered about the left lower quadrant colostomy site. 4. Previously established hypermetabolic hepatic metastatic disease remains inapparent by CT. 5. Status post sigmoid colon resection and reanastomosis. 6. Prostatomegaly with thickening of the decompressed urinary bladder, likely secondary to chronic outlet obstruction. 7. Cholelithiasis.   Aortic Atherosclerosis (ICD10-I70.0).   01/29/2021 Imaging   CT CAP   IMPRESSION: 1. Minimal interval progression of bilateral pulmonary nodules, concerning for metastatic  disease. 2. No substantial change in size of the peritoneal nodule at the inferior tip of the spleen and splenic flexure nodule. 3. Nodular soft tissue measured at the ostomy site previously is not evident today. 4. Marked prostatomegaly. 5. Cholelithiasis. 6. Aortic Atherosclerosis (ICD10-I70.0).   04/06/2021 Imaging   EXAM: CT ABDOMEN AND PELVIS WITHOUT CONTRAST (Renal Stone Protocol)  IMPRESSION: 1. Bladder wall thickening with surrounding inflammation concerning for cystitis. 2. Partial colectomy. Colon is air-filled with relative transition just proximal to the anastomosis. Stricture or recurrence at this level would be difficult to exclude. No focal mass identified. 3. There is a new subcutaneous fluid collection at the level of prior ostomy in the left abdominal wall. Infection not excluded. 4. There is new intramuscular hyperdensity at the level of the prior ostomy, indeterminate. Findings may related to scarring, tumor recurrence or metastatic disease is not excluded. 5. Stable nodularity in the left upper quadrant. 6. Cholelithiasis. 7. Trace right pleural effusion. 8. Stable prostatomegaly. 9.  Aortic Atherosclerosis (ICD10-I70.0).   05/05/2021 Pathology Results   FINAL MICROSCOPIC DIAGNOSIS:   A. ABDOMINAL WALL SCAR, EXCISION:  -  Adenocarcinoma  -  See comment   COMMENT:  Morphologically consistent with colonic adenocarcinoma (similar to  previously reported ([id number]).   06/02/2021 Imaging   EXAM: CT CHEST, ABDOMEN, AND PELVIS WITH CONTRAST  IMPRESSION: 1. Progressive multifocal pulmonary metastatic disease. 2. New low-density hepatic lesions, likely metastases. 3. Progressive peritoneal implant near the splenic flexure of the colon. Small peritoneal implant inferior to the spleen appears unchanged. 4. Increased soft tissue nodularity near the midline incision in the upper anterior abdominal wall, suspicious for tumor recurrence at the incision.  This could reflect keloid formation. 5. Fluid collection in the left anterior abdominal wall attributed to recent abdominal surgery with associated small incisional hernia containing fat.  6. Additional incidental findings including prostatomegaly, cholelithiasis and Aortic Atherosclerosis (ICD10-I70.0).   06/11/2021 - 11/26/2021 Chemotherapy   Patient is on Treatment Plan : COLORECTALXeloda + Bevacizumab q21d     07/29/2021 Imaging   EXAMINATION: CT ABDOMEN PELVIS WO CONTRAST  Impression  1. Left anterior abdominal wall postsurgical changes with no recurrent hernia.  2. Stable indeterminate 3 cm ventral abdominal wall mass located at the L1-2 level. Question scarring from prior surgery versus neoplasm.  3. Cholelithiasis.  4. Stable indeterminate bibasilar lung nodules. All of with a CT chest is recommended.  5. Indeterminate stable right hepatic lesions. Consider follow-up with an abdominal MRI with and without contrast for additional characterization.     12/17/2021 - 02/01/2022 Chemotherapy   Patient is on Treatment Plan : COLORECTAL CapeOx + Bevacizumab q21d     12/17/2021 - 03/17/2022 Chemotherapy   Patient is on Treatment Plan : COLORECTAL CapeOx + Bevacizumab q21d     04/27/2022 -  Chemotherapy   Patient is on Treatment Plan : COLORECTAL FOLFIRI + Bevacizumab q14d        INTERVAL HISTORY: *** Dave Vaughn is here for a follow up of  metastatic colon cancer  He was last seen by me on 03/29/2022 He presents to the clinic      All other systems were reviewed with the patient and are negative.  MEDICAL HISTORY:  Past Medical History:  Diagnosis Date   Adenocarcinoma, colon ([place]) dx'd 01/2018   Anemia    taking iron supplements   Anxiety    Atrial fibrillation with RVR ([facility])    Colonic obstruction ([place]) 01/10/2018   Depression    Diverticulitis    Dysrhythmia    afib   History of kidney stones    Hypertension    Indwelling Foley catheter present    due to UTI     SURGICAL HISTORY: Past Surgical History:  Procedure Laterality Date   ANKLE SURGERY Left    when he was in college   COLON RESECTION N/A 01/11/2018   Procedure: LEFT COLON RESECTION, TAKEDOWN SPLENIC FLEXURE, COLOSTOMY;  Surgeon: [name], MD;  Location: [facility];  Service: General;  Laterality: N/A;   COLONOSCOPY  01/11/2018   Procedure: COLONOSCOPY;  Surgeon: Jackquline Denmark, MD;  Location: [facility];  Service: Endoscopy;;   COLONOSCOPY  05/10/2018   colonscopy  05/10/2018   EXCISION OF KELOID N/A 05/05/2021   Procedure: ABDOMINAL SCAR EXCISION;  Surgeon: Ralene Ok, MD;  Location: [place];  Service: General;  Laterality: N/A;   FOREIGN BODY REMOVAL ABDOMINAL N/A 03/09/2021   Procedure: EXCISION OF FOREIGN BODY;  Surgeon: Ralene Ok, MD;  Location: [place];  Service: General;  Laterality: N/A;   HERNIA REPAIR     HERNIA REPAIR  03/02/2019   EXPLORATORY LAPAROTOMY (N/A Abdomen)   INCISIONAL HERNIA REPAIR N/A 03/02/2019   Procedure: INCISIONAL HERNIA REPAIR , RECTORECTUS VS TAR HERNIA REPAIR;  Surgeon: Ralene Ok, MD;  Location: [place];  Service: General;  Laterality: N/A;   INSERTION OF MESH N/A 03/02/2019   Procedure: Insertion Of Mesh;  Surgeon: Ralene Ok, MD;  Location: [place];  Service: General;  Laterality: N/A;   LAPAROTOMY N/A 03/02/2019   Procedure: EXPLORATORY LAPAROTOMY;  Surgeon: Ralene Ok, MD;  Location: [place];  Service: General;  Laterality: N/A;   LAPAROTOMY N/A 03/09/2021   Procedure: EXPLORATION OF ABDOMINAL WALL;  Surgeon: Ralene Ok, MD;  Location: [place];  Service: General;  Laterality: N/A;   LYSIS OF ADHESION N/A  06/12/2018   Procedure: LYSIS OF ADHESIONS;  Surgeon: [name], MD;  Location: [facility];  Service: General;  Laterality: N/A;   LYSIS OF ADHESION N/A 03/02/2019   Procedure: Lysis Of Adhesion;  Surgeon: Ralene Ok, MD;  Location: [place];  Service: General;  Laterality: N/A;   PORTACATH PLACEMENT Right 03/07/2018    Procedure: INSERTION PORT-A-CATH RIGHT SUBCLAVIAN;  Surgeon: [name], MD;  Location: [place];  Service: General;  Laterality: Right;   PROCTOSCOPY N/A 06/12/2018   Procedure: RIGID PROCTOSCOPY;  Surgeon: [name], MD;  Location: [facility];  Service: General;  Laterality: N/A;   thumb surgery   2018   cyst removal    I have reviewed the social history and family history with the patient and they are unchanged from previous note.  ALLERGIES:  has No Known Allergies.  MEDICATIONS:  Current Outpatient Medications  Medication Sig Dispense Refill   ALPRAZolam (XANAX) 0.25 MG tablet Take 1 tablet (0.25 mg total) by mouth at bedtime as needed for anxiety. 20 tablet 0   apixaban (ELIQUIS) 5 MG TABS tablet TAKE 1 TABLET(5 MG) BY MOUTH TWICE DAILY 60 tablet 5   b complex vitamins tablet Take 1 tablet by mouth daily.     diltiazem (CARDIZEM CD) 360 MG 24 hr capsule TAKE 1 CAPSULE(360 MG) BY MOUTH DAILY 90 capsule 3   eszopiclone (LUNESTA) 1 MG TABS tablet Take 1 tablet (1 mg total) by mouth at bedtime as needed for sleep. Take immediately before bedtime 30 tablet 2   finasteride (PROSCAR) 5 MG tablet Take 5 mg by mouth daily.     levothyroxine (SYNTHROID) 75 MCG tablet TAKE 1 TABLET(75 MCG) BY MOUTH DAILY 90 tablet 1   lidocaine-prilocaine (EMLA) cream Apply 1 application topically as needed. 30 g 1   Multiple Vitamins-Iron (MULTIVITAMIN/IRON PO) Take 1 tablet by mouth daily.      Oxycodone HCl 10 MG TABS Take 1 tablet (10 mg total) by mouth every 6 (six) hours as needed. 90 tablet 0   tamsulosin (FLOMAX) 0.4 MG CAPS capsule Take 0.4 mg by mouth 2 (two) times daily.      No current facility-administered medications for this visit.    PHYSICAL EXAMINATION: ECOG PERFORMANCE STATUS: {CHL ONC ECOG PS:[phone]}  There were no vitals filed for this visit. Wt Readings from Last 3 Encounters:  03/17/22 224 lb 1.6 oz (101.7 kg)  02/24/22 222 lb 6.4 oz (100.9 kg)  02/01/22 220 lb 8 oz (100 kg)     {Only keep what was examined. If exam not performed, can use .CEXAM } GENERAL:alert, no distress and comfortable SKIN: skin color, texture, turgor are normal, no rashes or significant lesions EYES: normal, Conjunctiva are pink and non-injected, sclera clear {OROPHARYNX:no exudate, no erythema and lips, buccal mucosa, and tongue normal}  NECK: supple, thyroid normal size, non-tender, without nodularity LYMPH:  no palpable lymphadenopathy in the cervical, axillary {or inguinal} LUNGS: clear to auscultation and percussion with normal breathing effort HEART: regular rate & rhythm and no murmurs and no lower extremity edema ABDOMEN:abdomen soft, non-tender and normal bowel sounds Musculoskeletal:no cyanosis of digits and no clubbing  NEURO: alert & oriented x 3 with fluent speech, no focal motor/sensory deficits  LABORATORY DATA:  I have reviewed the data as listed    Latest Ref Rng & Units 03/17/2022    8:44 AM 02/24/2022   11:13 AM 02/01/2022   11:21 AM  CBC  WBC 4.0 - 10.5 K/uL 5.6  9.0  6.1  Hemoglobin 13.0 - 17.0 g/dL 14.0  14.0  14.7   Hematocrit 39.0 - 52.0 % 40.3  40.3  41.8   Platelets 150 - 400 K/uL 208  189  185         Latest Ref Rng & Units 03/17/2022    8:44 AM 02/24/2022   11:13 AM 02/01/2022   11:21 AM  CMP  Glucose 70 - 99 mg/dL 132  135  118   BUN 8 - 23 mg/dL '18  17  17   '$ Creatinine 0.61 - 1.24 mg/dL 1.12  1.08  1.07   Sodium 135 - 145 mmol/L 140  139  138   Potassium 3.5 - 5.1 mmol/L 3.7  3.6  4.0   Chloride 98 - 111 mmol/L 105  106  103   CO2 22 - 32 mmol/L '30  28  30   '$ Calcium 8.9 - 10.3 mg/dL 8.9  8.6  9.4   Total Protein 6.5 - 8.1 g/dL 6.8  6.4  6.9   Total Bilirubin 0.3 - 1.2 mg/dL 1.0  1.2  1.0   Alkaline Phos 38 - 126 U/L 115  117  105   AST 15 - 41 U/L '24  24  24   '$ ALT 0 - 44 U/L '19  19  19       '$ RADIOGRAPHIC STUDIES: I have personally reviewed the radiological images as listed and agreed with the findings in the report. No results found.     No orders of the defined types were placed in this encounter.  All questions were answered. The patient knows to call the clinic with any problems, questions or concerns. No barriers to learning was detected. The total time spent in the appointment was {CHL ONC TIME VISIT - YIRSW:[id number]}.     Baldemar Friday, CMA 05/11/2022   I, Audry Riles, CMA, am acting as scribe for [name], MD.   {Add scribe attestation statement}

## 2022-05-12 MED FILL — Dexamethasone Sodium Phosphate Inj 100 MG/10ML: INTRAMUSCULAR | Qty: 1 | Status: AC

## 2022-05-13 ENCOUNTER — Encounter: Payer: Self-pay | Admitting: Hematology

## 2022-05-13 ENCOUNTER — Inpatient Hospital Stay (HOSPITAL_BASED_OUTPATIENT_CLINIC_OR_DEPARTMENT_OTHER): Payer: BC Managed Care – PPO | Admitting: Hematology

## 2022-05-13 ENCOUNTER — Inpatient Hospital Stay: Payer: BC Managed Care – PPO

## 2022-05-13 ENCOUNTER — Inpatient Hospital Stay: Payer: BC Managed Care – PPO | Attending: Nurse Practitioner

## 2022-05-13 VITALS — BP 120/77 | HR 75 | Temp 98.3°F | Resp 18 | Ht 70.0 in | Wt 214.2 lb

## 2022-05-13 DIAGNOSIS — C786 Secondary malignant neoplasm of retroperitoneum and peritoneum: Secondary | ICD-10-CM | POA: Insufficient documentation

## 2022-05-13 DIAGNOSIS — Z5111 Encounter for antineoplastic chemotherapy: Secondary | ICD-10-CM | POA: Diagnosis not present

## 2022-05-13 DIAGNOSIS — Z5112 Encounter for antineoplastic immunotherapy: Secondary | ICD-10-CM | POA: Insufficient documentation

## 2022-05-13 DIAGNOSIS — N1831 Chronic kidney disease, stage 3a: Secondary | ICD-10-CM

## 2022-05-13 DIAGNOSIS — Z95828 Presence of other vascular implants and grafts: Secondary | ICD-10-CM

## 2022-05-13 DIAGNOSIS — C186 Malignant neoplasm of descending colon: Secondary | ICD-10-CM

## 2022-05-13 DIAGNOSIS — Z452 Encounter for adjustment and management of vascular access device: Secondary | ICD-10-CM | POA: Insufficient documentation

## 2022-05-13 DIAGNOSIS — Z7189 Other specified counseling: Secondary | ICD-10-CM

## 2022-05-13 DIAGNOSIS — G893 Neoplasm related pain (acute) (chronic): Secondary | ICD-10-CM | POA: Insufficient documentation

## 2022-05-13 DIAGNOSIS — I4891 Unspecified atrial fibrillation: Secondary | ICD-10-CM | POA: Insufficient documentation

## 2022-05-13 DIAGNOSIS — Z7901 Long term (current) use of anticoagulants: Secondary | ICD-10-CM | POA: Insufficient documentation

## 2022-05-13 DIAGNOSIS — C787 Secondary malignant neoplasm of liver and intrahepatic bile duct: Secondary | ICD-10-CM | POA: Insufficient documentation

## 2022-05-13 LAB — CBC WITH DIFFERENTIAL (CANCER CENTER ONLY)
Abs Immature Granulocytes: 0.01 10*3/uL (ref 0.00–0.07)
Basophils Absolute: 0.1 10*3/uL (ref 0.0–0.1)
Basophils Relative: 1 %
Eosinophils Absolute: 0.1 10*3/uL (ref 0.0–0.5)
Eosinophils Relative: 2 %
HCT: 42.5 % (ref 39.0–52.0)
Hemoglobin: 14.2 g/dL (ref 13.0–17.0)
Immature Granulocytes: 0 %
Lymphocytes Relative: 32 %
Lymphs Abs: 2.2 10*3/uL (ref 0.7–4.0)
MCH: 31.1 pg (ref 26.0–34.0)
MCHC: 33.4 g/dL (ref 30.0–36.0)
MCV: 93 fL (ref 80.0–100.0)
Monocytes Absolute: 0.7 10*3/uL (ref 0.1–1.0)
Monocytes Relative: 10 %
Neutro Abs: 3.8 10*3/uL (ref 1.7–7.7)
Neutrophils Relative %: 55 %
Platelet Count: 218 10*3/uL (ref 150–400)
RBC: 4.57 MIL/uL (ref 4.22–5.81)
RDW: 14 % (ref 11.5–15.5)
WBC Count: 6.9 10*3/uL (ref 4.0–10.5)
nRBC: 0 % (ref 0.0–0.2)

## 2022-05-13 LAB — CMP (CANCER CENTER ONLY)
ALT: 14 U/L (ref 0–44)
AST: 22 U/L (ref 15–41)
Albumin: 3.9 g/dL (ref 3.5–5.0)
Alkaline Phosphatase: 114 U/L (ref 38–126)
Anion gap: 6 (ref 5–15)
BUN: 18 mg/dL (ref 8–23)
CO2: 29 mmol/L (ref 22–32)
Calcium: 9.6 mg/dL (ref 8.9–10.3)
Chloride: 107 mmol/L (ref 98–111)
Creatinine: 1.02 mg/dL (ref 0.61–1.24)
GFR, Estimated: >60 mL/min (ref >60)
Glucose, Bld: 105 mg/dL — ABNORMAL HIGH (ref 70–99)
Potassium: 3.9 mmol/L (ref 3.5–5.1)
Sodium: 142 mmol/L (ref 135–145)
Total Bilirubin: 0.6 mg/dL (ref 0.3–1.2)
Total Protein: 6.9 g/dL (ref 6.5–8.1)

## 2022-05-13 LAB — TOTAL PROTEIN, URINE DIPSTICK: Protein, ur: 30 mg/dL — AB

## 2022-05-13 MED ORDER — SODIUM CHLORIDE 0.9 % IV SOLN
2225.0000 mg/m2 | INTRAVENOUS | Status: DC
  Administered 2022-05-13: 5000 mg via INTRAVENOUS
  Filled 2022-05-13: qty 100

## 2022-05-13 MED ORDER — PROCHLORPERAZINE MALEATE 10 MG PO TABS: 10.0000 mg | ORAL_TABLET | Freq: Four times a day (QID) | ORAL | 1 refills | Status: DC | PRN

## 2022-05-13 MED ORDER — SODIUM CHLORIDE 0.9 % IV SOLN
10.0000 mg | Freq: Once | INTRAVENOUS | Status: AC
  Administered 2022-05-13: 10 mg via INTRAVENOUS
  Filled 2022-05-13: qty 10

## 2022-05-13 MED ORDER — SODIUM CHLORIDE 0.9 % IV SOLN
5.0000 mg/kg | Freq: Once | INTRAVENOUS | Status: AC
  Administered 2022-05-13: 500 mg via INTRAVENOUS
  Filled 2022-05-13: qty 16

## 2022-05-13 MED ORDER — DEXAMETHASONE 4 MG PO TABS: 8.0000 mg | ORAL_TABLET | Freq: Every day | ORAL | 5 refills | Status: DC

## 2022-05-13 MED ORDER — SODIUM CHLORIDE 0.9 % IV SOLN
400.0000 mg/m2 | Freq: Once | INTRAVENOUS | Status: AC
  Administered 2022-05-13: 896 mg via INTRAVENOUS
  Filled 2022-05-13: qty 44.8

## 2022-05-13 MED ORDER — ATROPINE SULFATE 1 MG/ML IV SOLN
0.5000 mg | Freq: Once | INTRAVENOUS | Status: AC | PRN
  Administered 2022-05-13: 0.5 mg via INTRAVENOUS

## 2022-05-13 MED ORDER — ALTEPLASE 2 MG IJ SOLR
2.0000 mg | Freq: Once | INTRAMUSCULAR | Status: AC | PRN
  Administered 2022-05-13: 2 mg
  Filled 2022-05-13: qty 2

## 2022-05-13 MED ORDER — PALONOSETRON HCL INJECTION 0.25 MG/5ML
0.2500 mg | Freq: Once | INTRAVENOUS | Status: AC
  Administered 2022-05-13: 0.25 mg via INTRAVENOUS

## 2022-05-13 MED ORDER — SODIUM CHLORIDE 0.9 % IV SOLN
160.0000 mg/m2 | Freq: Once | INTRAVENOUS | Status: AC
  Administered 2022-05-13: 360 mg via INTRAVENOUS
  Filled 2022-05-13: qty 15

## 2022-05-13 MED ORDER — SODIUM CHLORIDE 0.9 % IV SOLN: Freq: Once | INTRAVENOUS | Status: AC

## 2022-05-13 MED ORDER — ONDANSETRON HCL 8 MG PO TABS: 8.0000 mg | ORAL_TABLET | Freq: Three times a day (TID) | ORAL | 1 refills | Status: DC | PRN

## 2022-05-13 MED ORDER — LIDOCAINE-PRILOCAINE 2.5-2.5 % EX CREA: TOPICAL_CREAM | CUTANEOUS | 3 refills | Status: DC

## 2022-05-13 NOTE — Assessment & Plan Note (Signed)
stage IIIB (pT3N1cM0), MSI-stable, KRAS Q61H mutation (+), liver and peritoneal metastasis in 12/2019   --Diagnosed in 01/2018. S/p surgery and adjuvant FOLFOX 6 cycles . He did not tolerate well and declined additional treatment at that time. -restaging in 12/2019 revealed local recurrence in left abdominal wall and peritoneal metastasis, 2 hypermetabolic liver mets. Abdominal wall biopsy confirmed metastatic colorectal adenocarcinoma in 01/2020  -FO showed KRAS mutation (+), MSS -s/p CAPOX+beva, xeloda+beva and he had multiple abdominal surgery  -due to recent cancer progression, plan to change to FOLFIRI and beva

## 2022-05-13 NOTE — Assessment & Plan Note (Signed)
-  We again discussed the incurable nature of his cancer, and the overall poor prognosis, especially if he does not have good response to chemotherapy or progress on chemo -The patient understands the goal of care is palliative. -I recommend DNR/DNI, he will think about it

## 2022-05-13 NOTE — Assessment & Plan Note (Signed)
-  he has abdominal pain from peritoneal mets -on oxycodone

## 2022-05-14 ENCOUNTER — Telehealth: Payer: Self-pay | Admitting: Hematology

## 2022-05-14 ENCOUNTER — Telehealth: Payer: Self-pay

## 2022-05-14 NOTE — Telephone Encounter (Signed)
Called patient to schedule next appointments. Left voicemail of upcoming appointments.

## 2022-05-14 NOTE — Telephone Encounter (Signed)
-----   Message from Tildon Husky, RN sent at 05/13/2022  1:13 PM EST ----- Regarding: first time treatment call back- Dr Burr Medico Patient received folfox today for the first time in a while he has had it in the past just restarted it today. Also he received a new biosimilar for avastin just wanted to see if you could check on him and make sure he is tolerating everything well. Treatment went great with no issues.

## 2022-05-14 NOTE — Telephone Encounter (Signed)
Dave Vaughn states that he is doing fine. He is only experiencing a little fatigue. He is eating,drinking, and urinating well. He knows to call the office at [phone] if he has any questions or concerns.

## 2022-05-15 ENCOUNTER — Inpatient Hospital Stay: Payer: BC Managed Care – PPO

## 2022-05-15 VITALS — BP 133/73 | HR 85 | Temp 97.9°F | Resp 20

## 2022-05-15 DIAGNOSIS — G893 Neoplasm related pain (acute) (chronic): Secondary | ICD-10-CM | POA: Diagnosis not present

## 2022-05-15 DIAGNOSIS — Z7901 Long term (current) use of anticoagulants: Secondary | ICD-10-CM | POA: Diagnosis not present

## 2022-05-15 DIAGNOSIS — Z452 Encounter for adjustment and management of vascular access device: Secondary | ICD-10-CM | POA: Diagnosis not present

## 2022-05-15 DIAGNOSIS — C186 Malignant neoplasm of descending colon: Secondary | ICD-10-CM

## 2022-05-15 DIAGNOSIS — Z5111 Encounter for antineoplastic chemotherapy: Secondary | ICD-10-CM | POA: Diagnosis not present

## 2022-05-15 DIAGNOSIS — Z5112 Encounter for antineoplastic immunotherapy: Secondary | ICD-10-CM | POA: Diagnosis not present

## 2022-05-15 DIAGNOSIS — I4891 Unspecified atrial fibrillation: Secondary | ICD-10-CM | POA: Diagnosis not present

## 2022-05-15 DIAGNOSIS — C787 Secondary malignant neoplasm of liver and intrahepatic bile duct: Secondary | ICD-10-CM | POA: Diagnosis not present

## 2022-05-15 DIAGNOSIS — C786 Secondary malignant neoplasm of retroperitoneum and peritoneum: Secondary | ICD-10-CM | POA: Diagnosis not present

## 2022-05-15 MED ORDER — SODIUM CHLORIDE 0.9% FLUSH
10.0000 mL | INTRAVENOUS | Status: DC | PRN
  Administered 2022-05-15: 10 mL

## 2022-05-15 MED ORDER — HEPARIN SOD (PORK) LOCK FLUSH 100 UNIT/ML IV SOLN
500.0000 [IU] | Freq: Once | INTRAVENOUS | Status: AC | PRN
  Administered 2022-05-15: 500 [IU]

## 2022-05-16 ENCOUNTER — Other Ambulatory Visit: Payer: Self-pay

## 2022-05-20 ENCOUNTER — Telehealth: Payer: Self-pay

## 2022-05-20 NOTE — Telephone Encounter (Signed)
Patient called stating that he has had diarrhea since his last chemo treatment. He is having 2-3 episodes per day. He wanted to know what to do to help with this. He is taking 1 immodium after each voiding. Nurse Tammi Sou instructed me to tell him to take 2 immodium after each void and to make sure he stays hydrated. Patient understood and said he would do as instructed.

## 2022-05-21 ENCOUNTER — Other Ambulatory Visit: Payer: Self-pay | Admitting: Hematology

## 2022-05-21 ENCOUNTER — Telehealth: Payer: Self-pay

## 2022-05-21 MED ORDER — DIPHENOXYLATE-ATROPINE 2.5-0.025 MG PO TABS: 1.0000 | ORAL_TABLET | Freq: Four times a day (QID) | ORAL | 1 refills | Status: DC | PRN

## 2022-05-21 NOTE — Telephone Encounter (Signed)
Pt called stating that he's having diarrhea x12/day.  Pt stated he's taking the Imodium AD as recommended from previous telephone conversation on 05/20/2022 with Maurine Simmering, [place].  Pt stated he doubled up on the Imodium but does not see that his diarrhea is improving.  Pt stated he's hydrating appropriately by drinking 3 to 4 bottles of Gatorade daily.  Recommended that pt drink Liquid IV for better hydration.  Also recommended, for pt to take Zofran plus the Imodium AD to help with the diarrhea until Dr. Burr Medico prescribes pt Lomotil.  Pt does not feel he needs to come in for additional hydration but this RN will contact him on Monday to see if he needs to come in for hydration.  Notified Dr. Burr Medico of the pt's call.

## 2022-05-23 ENCOUNTER — Other Ambulatory Visit: Payer: Self-pay | Admitting: Cardiology

## 2022-05-23 NOTE — Progress Notes (Unsigned)
Dave Vaughn   Telephone:([phone] [phone]   Clinic Follow up Note   Patient Care Team: [name], MD as PCP - General (Hematology) [name], MD as Consulting Physician (Cardiology) [name], MD as Consulting Physician (Gastroenterology) [name], MD as Consulting Physician (General Surgery) [name], MD as Consulting Physician (General Surgery) [name], MD as Consulting Physician (Urology) [name], MD as Consulting Physician (Medical Oncology) 05/23/2022  CHIEF COMPLAINT: Follow up metastatic colon cancer   SUMMARY OF ONCOLOGIC HISTORY: Oncology History Overview Note   Cancer Staging  Cancer of left colon [facility]) Staging form: Colon and Rectum, AJCC 8th Edition - Pathologic stage from 01/11/2018: Stage IIIB (pT3, pN1c, cM0) - Signed by [name], MD on 01/16/2018 Total positive nodes: 0 Histologic grading system: 4 grade system Histologic grade (G): G2     Cancer of left colon ([place])  01/10/2018 Imaging   CT AP W Contrast 01/10/18  IMPRESSION: Irregular soft tissue density causing stricture of the mid descending colon likely the site of obstruction for the dilated small bowel. 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 No evidence of perforation.   Equivocal findings involving the appendix measuring 1.2 cm at the appendiceal tip with mucosal enhancement. No adjacent free fluid or inflammatory change. Findings are nonspecific, but can be seen with early acute appendicitis.   Mild prostatic enlargement. Increased density over the posterior bladder base likely due to the large prostatic impression although cannot completely exclude a bladder mass. Urology protocol CT or ultrasound may be helpful for better evaluation.   Mild cholelithiasis.   Stable 1.5 cm cystic structure over the lower pole right kidney likely slightly hyperdense cyst.   Diverticulosis of the colon.   Aortic Atherosclerosis  (ICD10-I70.0).   01/11/2018 Cancer Staging   Staging form: Colon and Rectum, AJCC 8th Edition - Pathologic stage from 01/11/2018: Stage IIIB (pT3, pN1c, cM0) - Signed by [name], MD on 01/16/2018   01/11/2018 Surgery   LEFT COLON RESECTION, TAKEDOWN SPLENIC FLEXURE, COLOSTOMY by Dr. [name]    01/11/2018 Procedure   Colonoscopy 01/11/18 by Dr. [name]  - Malignant completely obstructing tumor in the mid descending colon. Tattooed. - Diverticulosis in the sigmoid colon. - Non-bleeding internal hemorrhoids. - No specimens collected.   01/11/2018 Pathology Results   Diagnosis 01/11/18  1. Colon, segmental resection for tumor, descending colon - INVASIVE COLORECTAL ADENOCARCINOMA, 4 CM. - TUMOR EXTENDS INTO PERICOLONIC CONNECTIVE TISSUE. - TUMOR FOCALLY INVOLVES RADIAL MARGIN. - ONE MESENTERIC TUMOR DEPOSIT. - THIRTEEN BENIGN LYMPH NODES (0/13). 2. Colon, segmental resection, splenic flexure - BENIGN COLON. - NO EVIDENCE OF MALIGNANCY .   01/11/2018 Tumor Marker   Baseline CEA at 3.4   01/16/2018 Initial Diagnosis   Cancer of left colon ([place])   01/23/2018 Imaging   CT CHEST WO CONTRAST IMPRESSION: 1. No evidence for metastatic disease within the chest. 2. Small left pleural effusion with underlying opacities which may represent atelectasis. Right basilar atelectasis. 3. Few foci of gas within the upper abdomen in the omentum with surrounding fat stranding, likely postsurgical 4. Aortic Atherosclerosis (ICD10-I70.0).   03/08/2018 - 05/15/2018 Chemotherapy   adjuvant FOLOFX. Due to side effects of neuropahty Oxaliplatin was stopped after 3 cycles and chemo was stopped after 6 cycles. He declined completing 6 months of chemo treatment.     03/19/2018 Imaging   03/19/2018 CT AP IMPRESSION: 1. Interval partial left hemicolectomy and descending colostomy. 2. Heterogeneous soft tissue density along the left anterior renal fascia is  most likely postoperative (favor fat necrosis). No well-defined  fluid collection. 3. Mild left lower quadrant edema, new since 01/10/2018. This could be postoperative. Superimposed sigmoid diverticulitis and/or cystitis cannot be excluded. 4. Subtle hyperenhancing nodule within the anterior bladder dome cannot be excluded. Consider nonemergent cystoscopy. When this is performed, recommend attention to the left ureterovesicular junction and distal left ureter to evaluate questionable soft tissue fullness. 5. Cholelithiasis. 6.  Aortic Atherosclerosis (ICD10-I70.0). 7. Prostatomegaly.   11/13/2018 Imaging   CT CAP WO Contrast 11/13/18  IMPRESSION: 1. Reversal of left lower quadrant colostomy with sigmoid colon anastomosis. No complicating features. No findings for residual or recurrent tumor or metastatic disease involving the chest, abdomen or pelvis without contrast. 2. No acute abdominal/pelvic findings. 3. Gallbladder sludge and gallstones but no findings for acute cholecystitis. 4. Stable anterior abdominal wall hernia. 5. The right testicle is in the right inguinal canal.   10/03/2019 Imaging   CT CAP WO contrast  IMPRESSION: 1. New rounded density interposed between the prostate gland and anterior upper rectal wall could represent adenopathy or local extension of anterior rectal tumor. 2. Marked prostatomegaly, prostate volume 150 cubic cm. 3. Other imaging findings of potential clinical significance: Aortic Atherosclerosis (ICD10-I70.0). Coronary atherosclerosis. Trace right pleural effusion. Suspected cholelithiasis. Nonobstructive left nephrolithiasis. Multilevel lumbar impingement. Bilateral mildly retracted testicles. Hypodense exophytic lesion of the right kidney, most likely to be a cyst.   11/02/2019 Procedure   colonoscopy on 11/02/2019 by Dr. [name] Plan showed normal digital rectal exam, 3 polyps in the rectum, descending colon and cecum, and a prior sigmoid: Anastomosis characterized by erythema.  He found an extrinsic nonobstructing  medium-sized mass in the proximal rectum about 4 cm in length, no internal rectal mass.   Diagnosis Surgical [P], colon, cecum, descending, rectal, polyp (3) - TUBULAR ADENOMA (TWO) - NO HIGH GRADE DYSPLASIA OR CARCINOMA. - COLONIC FRAGMENT WITH BENIGN LYMPHOID AGGREGATE.   12/07/2019 Imaging   MRI pelvis IMPRESSION: 1. Masslike area in the rectum suspicious for rectal neoplasm, likely T4b based on the appearance of soft tissue extending along the anterior peritoneal reflection and into the seminal vesicles. Correlation with recent colonoscopy results may be helpful. Area of anastomosis and other areas of the pelvis are not imaged on today's exam.   12/21/2019 PET scan   IMPRESSION: 1. Unfortunately evidence for peritoneal metastasis. Intensely hypermetabolic nodules along the LEFT pericolic gutter. Favor hypermetabolic mass anterior to the rectum to represent serosal implant along the ventral surface of the rectum. 2. local recurrence within the LEFT abdominal wall at site prior colostomy. Intense hypermetabolic thickening of the rectus muscle at this site. 3. Two hypermetabolic hepatic metastasis.   01/15/2020 Relapse/Recurrence   FINAL MICROSCOPIC DIAGNOSIS:   A. SOFT TISSUE, LEFT ABDOMINAL WALL, BIOPSY:  - Adenocarcinoma.  - See comment.   COMMENT:   The morphology is consistent with metastatic colorectal adenocarcinoma.    01/28/2020 -  Chemotherapy   First line FOLFIRI q2weeks starting 01/28/20 for 8 cycles.  -----Bevacizumab added with C2.  -----Changed to maintenance Xeloda 2000 mg twice daily for 2 weeks on/1 week off and bevacizumab 06/02/20. Starting with C3, dose reduce to '1500mg'$  BID due to skin toxicity.   05/05/2020 Imaging   IMPRESSION: 1. Improved appearance, with reduced size of the hepatic metastatic lesions and reduced size of the peritoneal tumor implants. 2. Other imaging findings of potential clinical significance: Notable prostatomegaly. Multilevel  impingement in the lumbar spine. Dependent density in the gallbladder possibly from sludge or gallstones. Degenerative glenohumeral  arthropathy bilaterally. 3. Aortic atherosclerosis.   08/15/2020 Imaging   CT CAP  IMPRESSION: Chest Impression:   No evidence of thoracic metastasis   Abdomen / Pelvis Impression:   1. Hepatic metastasis are no longer measurable by CT imaging. 2. Peritoneal nodular metastasis in the LEFT abdomen are decreased in size. 3. No evidence of new peritoneal disease. 4. Thickening and LEFT rectus muscle at site of prior metastasis. No interval change. 5. No evidence of new or progressive colorectal carcinoma.     11/27/2020 Imaging   CT CAP  IMPRESSION: 1. There are multiple small pulmonary nodules in the right upper lobe that are new or enlarged compared to prior examination but measuring 4 mm or smaller, suspicious for pulmonary metastatic disease. 2. Unchanged peritoneal nodule adjacent to the tip of the spleen measuring 1.3 x 1.2 cm. Slightly peritoneal nodule adjacent to the splenic flexure measuring no greater than 6 mm, previously 8 mm. 3. No significant change in previously hypermetabolic soft tissue mass centered about the left lower quadrant colostomy site. 4. Previously established hypermetabolic hepatic metastatic disease remains inapparent by CT. 5. Status post sigmoid colon resection and reanastomosis. 6. Prostatomegaly with thickening of the decompressed urinary bladder, likely secondary to chronic outlet obstruction. 7. Cholelithiasis.   Aortic Atherosclerosis (ICD10-I70.0).   01/29/2021 Imaging   CT CAP   IMPRESSION: 1. Minimal interval progression of bilateral pulmonary nodules, concerning for metastatic disease. 2. No substantial change in size of the peritoneal nodule at the inferior tip of the spleen and splenic flexure nodule. 3. Nodular soft tissue measured at the ostomy site previously is not evident today. 4. Marked  prostatomegaly. 5. Cholelithiasis. 6. Aortic Atherosclerosis (ICD10-I70.0).   04/06/2021 Imaging   EXAM: CT ABDOMEN AND PELVIS WITHOUT CONTRAST (Renal Stone Protocol)  IMPRESSION: 1. Bladder wall thickening with surrounding inflammation concerning for cystitis. 2. Partial colectomy. Colon is air-filled with relative transition just proximal to the anastomosis. Stricture or recurrence at this level would be difficult to exclude. No focal mass identified. 3. There is a new subcutaneous fluid collection at the level of prior ostomy in the left abdominal wall. Infection not excluded. 4. There is new intramuscular hyperdensity at the level of the prior ostomy, indeterminate. Findings may related to scarring, tumor recurrence or metastatic disease is not excluded. 5. Stable nodularity in the left upper quadrant. 6. Cholelithiasis. 7. Trace right pleural effusion. 8. Stable prostatomegaly. 9.  Aortic Atherosclerosis (ICD10-I70.0).   05/05/2021 Pathology Results   FINAL MICROSCOPIC DIAGNOSIS:   A. ABDOMINAL WALL SCAR, EXCISION:  -  Adenocarcinoma  -  See comment   COMMENT:  Morphologically consistent with colonic adenocarcinoma (similar to  previously reported ([id number]).   06/02/2021 Imaging   EXAM: CT CHEST, ABDOMEN, AND PELVIS WITH CONTRAST  IMPRESSION: 1. Progressive multifocal pulmonary metastatic disease. 2. New low-density hepatic lesions, likely metastases. 3. Progressive peritoneal implant near the splenic flexure of the colon. Small peritoneal implant inferior to the spleen appears unchanged. 4. Increased soft tissue nodularity near the midline incision in the upper anterior abdominal wall, suspicious for tumor recurrence at the incision. This could reflect keloid formation. 5. Fluid collection in the left anterior abdominal wall attributed to recent abdominal surgery with associated small incisional hernia containing fat. 6. Additional incidental findings  including prostatomegaly, cholelithiasis and Aortic Atherosclerosis (ICD10-I70.0).   06/11/2021 - 11/26/2021 Chemotherapy   Patient is on Treatment Plan : COLORECTALXeloda + Bevacizumab q21d     07/29/2021 Imaging   EXAMINATION: CT ABDOMEN PELVIS WO CONTRAST  Impression  1. Left anterior abdominal wall postsurgical changes with no recurrent hernia.  2. Stable indeterminate 3 cm ventral abdominal wall mass located at the L1-2 level. Question scarring from prior surgery versus neoplasm.  3. Cholelithiasis.  4. Stable indeterminate bibasilar lung nodules. All of with a CT chest is recommended.  5. Indeterminate stable right hepatic lesions. Consider follow-up with an abdominal MRI with and without contrast for additional characterization.     12/17/2021 - 02/01/2022 Chemotherapy   Patient is on Treatment Plan : COLORECTAL [place]Ox + Bevacizumab q21d     12/17/2021 - 03/17/2022 Chemotherapy   Patient is on Treatment Plan : COLORECTAL CapeOx + Bevacizumab q21d     05/13/2022 -  Chemotherapy   Patient is on Treatment Plan : COLORECTAL FOLFIRI + Bevacizumab q14d       CURRENT THERAPY: Restarting FOLFIRI/Beva q14 days, on 05/13/22  INTERVAL HISTORY: Dave Vaughn returns for follow up and treatment as scheduled. Last seen by Dr. Burr Medico and started FOLFIRI/beva 12/7.    REVIEW OF SYSTEMS:   Constitutional: Denies fevers, chills or abnormal weight loss Eyes: Denies blurriness of vision Ears, nose, mouth, throat, and face: Denies mucositis or sore throat Respiratory: Denies cough, dyspnea or wheezes Cardiovascular: Denies palpitation, chest discomfort or lower extremity swelling Gastrointestinal:  Denies nausea, heartburn or change in bowel habits Skin: Denies abnormal skin rashes Lymphatics: Denies new lymphadenopathy or easy bruising Neurological:Denies numbness, tingling or new weaknesses Behavioral/Psych: Mood is stable, no new changes  All other systems were reviewed with the patient and are  negative.  MEDICAL HISTORY:  Past Medical History:  Diagnosis Date   Adenocarcinoma, colon ([place]) dx'd 01/2018   Anemia    taking iron supplements   Anxiety    Atrial fibrillation with RVR ([facility])    Colonic obstruction ([place]) 01/10/2018   Depression    Diverticulitis    Dysrhythmia    afib   History of kidney stones    Hypertension    Indwelling Foley catheter present    due to UTI    SURGICAL HISTORY: Past Surgical History:  Procedure Laterality Date   ANKLE SURGERY Left    when he was in college   COLON RESECTION N/A 01/11/2018   Procedure: LEFT COLON RESECTION, TAKEDOWN SPLENIC FLEXURE, COLOSTOMY;  Surgeon: [name], MD;  Location: [facility];  Service: General;  Laterality: N/A;   COLONOSCOPY  01/11/2018   Procedure: COLONOSCOPY;  Surgeon: Jackquline Denmark, MD;  Location: [facility];  Service: Endoscopy;;   COLONOSCOPY  05/10/2018   colonscopy  05/10/2018   EXCISION OF KELOID N/A 05/05/2021   Procedure: ABDOMINAL SCAR EXCISION;  Surgeon: Ralene Ok, MD;  Location: [place];  Service: General;  Laterality: N/A;   FOREIGN BODY REMOVAL ABDOMINAL N/A 03/09/2021   Procedure: EXCISION OF FOREIGN BODY;  Surgeon: Ralene Ok, MD;  Location: [place];  Service: General;  Laterality: N/A;   HERNIA REPAIR     HERNIA REPAIR  03/02/2019   EXPLORATORY LAPAROTOMY (N/A Abdomen)   INCISIONAL HERNIA REPAIR N/A 03/02/2019   Procedure: INCISIONAL HERNIA REPAIR , RECTORECTUS VS TAR HERNIA REPAIR;  Surgeon: Ralene Ok, MD;  Location: [place];  Service: General;  Laterality: N/A;   INSERTION OF MESH N/A 03/02/2019   Procedure: Insertion Of Mesh;  Surgeon: Ralene Ok, MD;  Location: [place];  Service: General;  Laterality: N/A;   LAPAROTOMY N/A 03/02/2019   Procedure: EXPLORATORY LAPAROTOMY;  Surgeon: Ralene Ok, MD;  Location: [place];  Service: General;  Laterality: N/A;  LAPAROTOMY N/A 03/09/2021   Procedure: EXPLORATION OF ABDOMINAL WALL;  Surgeon: Ralene Ok, MD;  Location: [place];  Service: General;  Laterality: N/A;   LYSIS OF ADHESION N/A 06/12/2018   Procedure: LYSIS OF ADHESIONS;  Surgeon: [name], MD;  Location: [facility];  Service: General;  Laterality: N/A;   LYSIS OF ADHESION N/A 03/02/2019   Procedure: Lysis Of Adhesion;  Surgeon: Ralene Ok, MD;  Location: [place];  Service: General;  Laterality: N/A;   PORTACATH PLACEMENT Right 03/07/2018   Procedure: INSERTION PORT-A-CATH RIGHT SUBCLAVIAN;  Surgeon: [name], MD;  Location: [place];  Service: General;  Laterality: Right;   PROCTOSCOPY N/A 06/12/2018   Procedure: RIGID PROCTOSCOPY;  Surgeon: [name], MD;  Location: [facility];  Service: General;  Laterality: N/A;   thumb surgery   2018   cyst removal    I have reviewed the social history and family history with the patient and they are unchanged from previous note.  ALLERGIES:  has No Known Allergies.  MEDICATIONS:  Current Outpatient Medications  Medication Sig Dispense Refill   ALPRAZolam (XANAX) 0.25 MG tablet Take 1 tablet (0.25 mg total) by mouth at bedtime as needed for anxiety. 20 tablet 0   apixaban (ELIQUIS) 5 MG TABS tablet TAKE 1 TABLET(5 MG) BY MOUTH TWICE DAILY 60 tablet 5   b complex vitamins tablet Take 1 tablet by mouth daily.     dexamethasone (DECADRON) 4 MG tablet Take 2 tablets (8 mg total) by mouth daily. Start the day after chemotherapy for 2 days. Take with food. 8 tablet 5   diltiazem (CARDIZEM CD) 360 MG 24 hr capsule TAKE 1 CAPSULE(360 MG) BY MOUTH DAILY 90 capsule 3   diphenoxylate-atropine (LOMOTIL) 2.5-0.025 MG tablet Take 1-2 tablets by mouth 4 (four) times daily as needed for diarrhea or loose stools. 60 tablet 1   eszopiclone (LUNESTA) 1 MG TABS tablet Take 1 tablet (1 mg total) by mouth at bedtime as needed for sleep. Take immediately before bedtime 30 tablet 2   finasteride (PROSCAR) 5 MG tablet Take 5 mg by mouth daily.     levothyroxine (SYNTHROID) 75 MCG tablet TAKE 1 TABLET(75 MCG) BY MOUTH DAILY 90  tablet 1   lidocaine-prilocaine (EMLA) cream Apply 1 application topically as needed. 30 g 1   lidocaine-prilocaine (EMLA) cream Apply to affected area once 30 g 3   Multiple Vitamins-Iron (MULTIVITAMIN/IRON PO) Take 1 tablet by mouth daily.      ondansetron (ZOFRAN) 8 MG tablet Take 1 tablet (8 mg total) by mouth every 8 (eight) hours as needed for nausea, vomiting or refractory nausea / vomiting. Start on the third day after chemotherapy. 30 tablet 1   Oxycodone HCl 10 MG TABS Take 1 tablet (10 mg total) by mouth every 6 (six) hours as needed. 90 tablet 0   prochlorperazine (COMPAZINE) 10 MG tablet Take 1 tablet (10 mg total) by mouth every 6 (six) hours as needed for nausea or vomiting. 30 tablet 1   tamsulosin (FLOMAX) 0.4 MG CAPS capsule Take 0.4 mg by mouth 2 (two) times daily.      No current facility-administered medications for this visit.    PHYSICAL EXAMINATION: ECOG PERFORMANCE STATUS: {CHL ONC ECOG [phone]}  There were no vitals filed for this visit. There were no vitals filed for this visit.  GENERAL:alert, no distress and comfortable SKIN: skin color, texture, turgor are normal, no rashes or significant lesions EYES: normal, Conjunctiva are pink and non-injected, sclera clear OROPHARYNX:no  exudate, no erythema and lips, buccal mucosa, and tongue normal  NECK: supple, thyroid normal size, non-tender, without nodularity LYMPH:  no palpable lymphadenopathy in the cervical, axillary or inguinal LUNGS: clear to auscultation and percussion with normal breathing effort HEART: regular rate & rhythm and no murmurs and no lower extremity edema ABDOMEN:abdomen soft, non-tender and normal bowel sounds Musculoskeletal:no cyanosis of digits and no clubbing  NEURO: alert & oriented x 3 with fluent speech, no focal motor/sensory deficits  LABORATORY DATA:  I have reviewed the data as listed    Latest Ref Rng & Units 05/13/2022    8:59 AM 03/17/2022    8:44 AM 02/24/2022   11:13  AM  CBC  WBC 4.0 - 10.5 K/uL 6.9  5.6  9.0   Hemoglobin 13.0 - 17.0 g/dL 14.2  14.0  14.0   Hematocrit 39.0 - 52.0 % 42.5  40.3  40.3   Platelets 150 - 400 K/uL 218  208  189         Latest Ref Rng & Units 05/13/2022    8:59 AM 03/17/2022    8:44 AM 02/24/2022   11:13 AM  CMP  Glucose 70 - 99 mg/dL 105  132  135   Vaughn 8 - 23 mg/dL '18  18  17   '$ Creatinine 0.61 - 1.24 mg/dL 1.02  1.12  1.08   Sodium 135 - 145 mmol/L 142  140  139   Potassium 3.5 - 5.1 mmol/L 3.9  3.7  3.6   Chloride 98 - 111 mmol/L 107  105  106   CO2 22 - 32 mmol/L '29  30  28   '$ Calcium 8.9 - 10.3 mg/dL 9.6  8.9  8.6   Total Protein 6.5 - 8.1 g/dL 6.9  6.8  6.4   Total Bilirubin 0.3 - 1.2 mg/dL 0.6  1.0  1.2   Alkaline Phos 38 - 126 U/L 114  115  117   AST 15 - 41 U/L '22  24  24   '$ ALT 0 - 44 U/L '14  19  19       '$ RADIOGRAPHIC STUDIES: I have personally reviewed the radiological images as listed and agreed with the findings in the report. No results found.   ASSESSMENT & PLAN:  No problem-specific Assessment & Plan notes found for this encounter.   No orders of the defined types were placed in this encounter.  All questions were answered. The patient knows to call the clinic with any problems, questions or concerns. No barriers to learning was detected. I spent {CHL ONC TIME VISIT - OLMBE:[id number]} counseling the patient face to face. The total time spent in the appointment was {CHL ONC TIME VISIT - OFHQR:[id number]} and more than 50% was on counseling and review of test results     Alla Feeling, NP 05/23/22

## 2022-05-24 ENCOUNTER — Telehealth: Payer: Self-pay

## 2022-05-24 NOTE — Telephone Encounter (Signed)
Patient call in wanting to see if he could reschedule or change his treatment due to the side effects of the chemo. Patient is experiencing major diarrhea. Patient called in wanting to get something called in for him he was having up to 12 episodes of diarrhea a day. Called in Lomotil for him now he is only having 1 or 2 a day now. Patient is hydrating well with Gatorade. He wants to know if he can take a break from treatment or if he can change his treatment to something different.

## 2022-05-24 NOTE — Telephone Encounter (Signed)
Rx refill sent to pharmacy.Rx refill sent to pharmacy.

## 2022-05-26 ENCOUNTER — Other Ambulatory Visit: Payer: Self-pay | Admitting: Hematology

## 2022-05-26 ENCOUNTER — Other Ambulatory Visit: Payer: Self-pay

## 2022-05-26 MED ORDER — ALPRAZOLAM 0.25 MG PO TABS: ORAL_TABLET | ORAL | 0 refills | Status: DC

## 2022-05-27 ENCOUNTER — Inpatient Hospital Stay: Payer: BC Managed Care – PPO

## 2022-05-27 ENCOUNTER — Inpatient Hospital Stay: Payer: BC Managed Care – PPO | Admitting: Nurse Practitioner

## 2022-05-31 NOTE — Progress Notes (Unsigned)
Dave Vaughn   [phone] [phone]   Clinic Follow up Note   Patient Care Team: [name], MD as PCP - General (Hematology) [name], MD as Consulting Physician (Cardiology) [name], MD as Consulting Physician (Gastroenterology) [name], MD as Consulting Physician (General Surgery) [name], MD as Consulting Physician (General Surgery) [name], MD as Consulting Physician (Urology) [name], MD as Consulting Physician (Medical Oncology) 06/02/2022  CHIEF COMPLAINT: Follow-up metastatic colon cancer  SUMMARY OF ONCOLOGIC HISTORY: Oncology History Overview Note   Cancer Staging  Cancer of left colon [facility]) Staging form: Colon and Rectum, AJCC 8th Edition - Pathologic stage from 01/11/2018: Stage IIIB (pT3, pN1c, cM0) - Signed by [name], MD on 01/16/2018 Total positive nodes: 0 Histologic grading system: 4 grade system Histologic grade (G): G2     Cancer of left colon ([facility])  01/10/2018 Imaging   CT AP W Contrast 01/10/18  IMPRESSION: Irregular soft tissue density causing stricture of the mid descending colon likely the site of obstruction for the dilated small bowel. 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 No evidence of perforation.   Equivocal findings involving the appendix measuring 1.2 cm at the appendiceal tip with mucosal enhancement. No adjacent free fluid or inflammatory change. Findings are nonspecific, but can be seen with early acute appendicitis.   Mild prostatic enlargement. Increased density over the posterior bladder base likely due to the large prostatic impression although cannot completely exclude a bladder mass. Urology protocol CT or ultrasound may be helpful for better evaluation.   Mild cholelithiasis.   Stable 1.5 cm cystic structure over the lower pole right kidney likely slightly hyperdense cyst.   Diverticulosis of the colon.   Aortic Atherosclerosis  (ICD10-I70.0).   01/11/2018 Cancer Staging   Staging form: Colon and Rectum, AJCC 8th Edition - Pathologic stage from 01/11/2018: Stage IIIB (pT3, pN1c, cM0) - Signed by [name], MD on 01/16/2018   01/11/2018 Surgery   LEFT COLON RESECTION, TAKEDOWN SPLENIC FLEXURE, COLOSTOMY by Dr. [name]    01/11/2018 Procedure   Colonoscopy 01/11/18 by Dr. [name]  - Malignant completely obstructing tumor in the mid descending colon. Tattooed. - Diverticulosis in the sigmoid colon. - Non-bleeding internal hemorrhoids. - No specimens collected.   01/11/2018 Pathology Results   Diagnosis 01/11/18  1. Colon, segmental resection for tumor, descending colon - INVASIVE COLORECTAL ADENOCARCINOMA, 4 CM. - TUMOR EXTENDS INTO PERICOLONIC CONNECTIVE TISSUE. - TUMOR FOCALLY INVOLVES RADIAL MARGIN. - ONE MESENTERIC TUMOR DEPOSIT. - THIRTEEN BENIGN LYMPH NODES (0/13). 2. Colon, segmental resection, splenic flexure - BENIGN COLON. - NO EVIDENCE OF MALIGNANCY .   01/11/2018 Tumor Marker   Baseline CEA at 3.4   01/16/2018 Initial Diagnosis   Cancer of left colon ([facility])   01/23/2018 Imaging   CT CHEST WO CONTRAST IMPRESSION: 1. No evidence for metastatic disease within the chest. 2. Small left pleural effusion with underlying opacities which may represent atelectasis. Right basilar atelectasis. 3. Few foci of gas within the upper abdomen in the omentum with surrounding fat stranding, likely postsurgical 4. Aortic Atherosclerosis (ICD10-I70.0).   03/08/2018 - 05/15/2018 Chemotherapy   adjuvant FOLOFX. Due to side effects of neuropahty Oxaliplatin was stopped after 3 cycles and chemo was stopped after 6 cycles. He declined completing 6 months of chemo treatment.     03/19/2018 Imaging   03/19/2018 CT AP IMPRESSION: 1. Interval partial left hemicolectomy and descending colostomy. 2. Heterogeneous soft tissue density along the left anterior renal fascia is most likely  postoperative (favor fat necrosis). No well-defined  fluid collection. 3. Mild left lower quadrant edema, new since 01/10/2018. This could be postoperative. Superimposed sigmoid diverticulitis and/or cystitis cannot be excluded. 4. Subtle hyperenhancing nodule within the anterior bladder dome cannot be excluded. Consider nonemergent cystoscopy. When this is performed, recommend attention to the left ureterovesicular junction and distal left ureter to evaluate questionable soft tissue fullness. 5. Cholelithiasis. 6.  Aortic Atherosclerosis (ICD10-I70.0). 7. Prostatomegaly.   11/13/2018 Imaging   CT CAP WO Contrast 11/13/18  IMPRESSION: 1. Reversal of left lower quadrant colostomy with sigmoid colon anastomosis. No complicating features. No findings for residual or recurrent tumor or metastatic disease involving the chest, abdomen or pelvis without contrast. 2. No acute abdominal/pelvic findings. 3. Gallbladder sludge and gallstones but no findings for acute cholecystitis. 4. Stable anterior abdominal wall hernia. 5. The right testicle is in the right inguinal canal.   10/03/2019 Imaging   CT CAP WO contrast  IMPRESSION: 1. New rounded density interposed between the prostate gland and anterior upper rectal wall could represent adenopathy or local extension of anterior rectal tumor. 2. Marked prostatomegaly, prostate volume 150 cubic cm. 3. Other imaging findings of potential clinical significance: Aortic Atherosclerosis (ICD10-I70.0). Coronary atherosclerosis. Trace right pleural effusion. Suspected cholelithiasis. Nonobstructive left nephrolithiasis. Multilevel lumbar impingement. Bilateral mildly retracted testicles. Hypodense exophytic lesion of the right kidney, most likely to be a cyst.   11/02/2019 Procedure   colonoscopy on 11/02/2019 by Dr. [name] Plan showed normal digital rectal exam, 3 polyps in the rectum, descending colon and cecum, and a prior sigmoid: Anastomosis characterized by erythema.  He found an extrinsic nonobstructing  medium-sized mass in the proximal rectum about 4 cm in length, no internal rectal mass.   Diagnosis Surgical [P], colon, cecum, descending, rectal, polyp (3) - TUBULAR ADENOMA (TWO) - NO HIGH GRADE DYSPLASIA OR CARCINOMA. - COLONIC FRAGMENT WITH BENIGN LYMPHOID AGGREGATE.   12/07/2019 Imaging   MRI pelvis IMPRESSION: 1. Masslike area in the rectum suspicious for rectal neoplasm, likely T4b based on the appearance of soft tissue extending along the anterior peritoneal reflection and into the seminal vesicles. Correlation with recent colonoscopy results may be helpful. Area of anastomosis and other areas of the pelvis are not imaged on today's exam.   12/21/2019 PET scan   IMPRESSION: 1. Unfortunately evidence for peritoneal metastasis. Intensely hypermetabolic nodules along the LEFT pericolic gutter. Favor hypermetabolic mass anterior to the rectum to represent serosal implant along the ventral surface of the rectum. 2. local recurrence within the LEFT abdominal wall at site prior colostomy. Intense hypermetabolic thickening of the rectus muscle at this site. 3. Two hypermetabolic hepatic metastasis.   01/15/2020 Relapse/Recurrence   FINAL MICROSCOPIC DIAGNOSIS:   A. SOFT TISSUE, LEFT ABDOMINAL WALL, BIOPSY:  - Adenocarcinoma.  - See comment.   COMMENT:   The morphology is consistent with metastatic colorectal adenocarcinoma.    01/28/2020 -  Chemotherapy   First line FOLFIRI q2weeks starting 01/28/20 for 8 cycles.  -----Bevacizumab added with C2.  -----Changed to maintenance Xeloda 2000 mg twice daily for 2 weeks on/1 week off and bevacizumab 06/02/20. Starting with C3, dose reduce to 1513m BID due to skin toxicity.   05/05/2020 Imaging   IMPRESSION: 1. Improved appearance, with reduced size of the hepatic metastatic lesions and reduced size of the peritoneal tumor implants. 2. Other imaging findings of potential clinical significance: Notable prostatomegaly. Multilevel  impingement in the lumbar spine. Dependent density in the gallbladder possibly from sludge or gallstones. Degenerative glenohumeral arthropathy bilaterally.  3. Aortic atherosclerosis.   08/15/2020 Imaging   CT CAP  IMPRESSION: Chest Impression:   No evidence of thoracic metastasis   Abdomen / Pelvis Impression:   1. Hepatic metastasis are no longer measurable by CT imaging. 2. Peritoneal nodular metastasis in the LEFT abdomen are decreased in size. 3. No evidence of new peritoneal disease. 4. Thickening and LEFT rectus muscle at site of prior metastasis. No interval change. 5. No evidence of new or progressive colorectal carcinoma.     11/27/2020 Imaging   CT CAP  IMPRESSION: 1. There are multiple small pulmonary nodules in the right upper lobe that are new or enlarged compared to prior examination but measuring 4 mm or smaller, suspicious for pulmonary metastatic disease. 2. Unchanged peritoneal nodule adjacent to the tip of the spleen measuring 1.3 x 1.2 cm. Slightly peritoneal nodule adjacent to the splenic flexure measuring no greater than 6 mm, previously 8 mm. 3. No significant change in previously hypermetabolic soft tissue mass centered about the left lower quadrant colostomy site. 4. Previously established hypermetabolic hepatic metastatic disease remains inapparent by CT. 5. Status post sigmoid colon resection and reanastomosis. 6. Prostatomegaly with thickening of the decompressed urinary bladder, likely secondary to chronic outlet obstruction. 7. Cholelithiasis.   Aortic Atherosclerosis (ICD10-I70.0).   01/29/2021 Imaging   CT CAP   IMPRESSION: 1. Minimal interval progression of bilateral pulmonary nodules, concerning for metastatic disease. 2. No substantial change in size of the peritoneal nodule at the inferior tip of the spleen and splenic flexure nodule. 3. Nodular soft tissue measured at the ostomy site previously is not evident today. 4. Marked  prostatomegaly. 5. Cholelithiasis. 6. Aortic Atherosclerosis (ICD10-I70.0).   04/06/2021 Imaging   EXAM: CT ABDOMEN AND PELVIS WITHOUT CONTRAST (Renal Stone Protocol)  IMPRESSION: 1. Bladder wall thickening with surrounding inflammation concerning for cystitis. 2. Partial colectomy. Colon is air-filled with relative transition just proximal to the anastomosis. Stricture or recurrence at this level would be difficult to exclude. No focal mass identified. 3. There is a new subcutaneous fluid collection at the level of prior ostomy in the left abdominal wall. Infection not excluded. 4. There is new intramuscular hyperdensity at the level of the prior ostomy, indeterminate. Findings may related to scarring, tumor recurrence or metastatic disease is not excluded. 5. Stable nodularity in the left upper quadrant. 6. Cholelithiasis. 7. Trace right pleural effusion. 8. Stable prostatomegaly. 9.  Aortic Atherosclerosis (ICD10-I70.0).   05/05/2021 Pathology Results   FINAL MICROSCOPIC DIAGNOSIS:   A. ABDOMINAL WALL SCAR, EXCISION:  -  Adenocarcinoma  -  See comment   COMMENT:  Morphologically consistent with colonic adenocarcinoma (similar to  previously reported ([id number]).   06/02/2021 Imaging   EXAM: CT CHEST, ABDOMEN, AND PELVIS WITH CONTRAST  IMPRESSION: 1. Progressive multifocal pulmonary metastatic disease. 2. New low-density hepatic lesions, likely metastases. 3. Progressive peritoneal implant near the splenic flexure of the colon. Small peritoneal implant inferior to the spleen appears unchanged. 4. Increased soft tissue nodularity near the midline incision in the upper anterior abdominal wall, suspicious for tumor recurrence at the incision. This could reflect keloid formation. 5. Fluid collection in the left anterior abdominal wall attributed to recent abdominal surgery with associated small incisional hernia containing fat. 6. Additional incidental findings  including prostatomegaly, cholelithiasis and Aortic Atherosclerosis (ICD10-I70.0).   06/11/2021 - 11/26/2021 Chemotherapy   Patient is on Treatment Plan : COLORECTALXeloda + Bevacizumab q21d     07/29/2021 Imaging   EXAMINATION: CT ABDOMEN PELVIS WO CONTRAST  Impression  1. Left anterior abdominal wall postsurgical changes with no recurrent hernia.  2. Stable indeterminate 3 cm ventral abdominal wall mass located at the L1-2 level. Question scarring from prior surgery versus neoplasm.  3. Cholelithiasis.  4. Stable indeterminate bibasilar lung nodules. All of with a CT chest is recommended.  5. Indeterminate stable right hepatic lesions. Consider follow-up with an abdominal MRI with and without contrast for additional characterization.     12/17/2021 - 02/01/2022 Chemotherapy   Patient is on Treatment Plan : COLORECTAL CapeOx + Bevacizumab q21d     12/17/2021 - 03/17/2022 Chemotherapy   Patient is on Treatment Plan : COLORECTAL CapeOx + Bevacizumab q21d     05/13/2022 -  Chemotherapy   Patient is on Treatment Plan : COLORECTAL FOLFIRI + Bevacizumab q14d       CURRENT THERAPY: FOLFIRI/bevacizumab every 2 weeks, starting 05/13/2022  INTERVAL HISTORY: Mr. Uecker returns for follow-up and treatment as scheduled, last seen by Dr. Burr Medico 05/13/2022 to begin FOLFIRI/Beva.  2 days after pump DC he developed small but many episodes of diarrhea up to 12/day that lasted over the course of a week.  Imodium was not helpful.  He called and was prescribed Lomotil which did help eventually.  He remained hydrated, did not need to come in for IV fluids. He had 1 episode of nausea with vomiting, Zofran helped. Also had mild mouth sores but able to eat normally. Energy was normal, continues working and has trips coming up. Abdominal pain has improved and he feels he can do more activity than he could prior to this chemo. Hair is falling out.    REVIEW OF SYSTEMS:   All other systems were reviewed with the patient  and are negative.  MEDICAL HISTORY:  Past Medical History:  Diagnosis Date   Adenocarcinoma, colon ([place]) dx'd 01/2018   Anemia    taking iron supplements   Anxiety    Atrial fibrillation with RVR ([facility])    Colonic obstruction ([place]) 01/10/2018   Depression    Diverticulitis    Dysrhythmia    afib   History of kidney stones    Hypertension    Indwelling Foley catheter present    due to UTI    SURGICAL HISTORY: Past Surgical History:  Procedure Laterality Date   ANKLE SURGERY Left    when he was in college   COLON RESECTION N/A 01/11/2018   Procedure: LEFT COLON RESECTION, TAKEDOWN SPLENIC FLEXURE, COLOSTOMY;  Surgeon: [name], MD;  Location: [facility];  Service: General;  Laterality: N/A;   COLONOSCOPY  01/11/2018   Procedure: COLONOSCOPY;  Surgeon: Jackquline Denmark, MD;  Location: [facility];  Service: Endoscopy;;   COLONOSCOPY  05/10/2018   colonscopy  05/10/2018   EXCISION OF KELOID N/A 05/05/2021   Procedure: ABDOMINAL SCAR EXCISION;  Surgeon: Ralene Ok, MD;  Location: [place];  Service: General;  Laterality: N/A;   FOREIGN BODY REMOVAL ABDOMINAL N/A 03/09/2021   Procedure: EXCISION OF FOREIGN BODY;  Surgeon: Ralene Ok, MD;  Location: [place];  Service: General;  Laterality: N/A;   HERNIA REPAIR     HERNIA REPAIR  03/02/2019   EXPLORATORY LAPAROTOMY (N/A Abdomen)   INCISIONAL HERNIA REPAIR N/A 03/02/2019   Procedure: INCISIONAL HERNIA REPAIR , RECTORECTUS VS TAR HERNIA REPAIR;  Surgeon: Ralene Ok, MD;  Location: [place];  Service: General;  Laterality: N/A;   INSERTION OF MESH N/A 03/02/2019   Procedure: Insertion Of Mesh;  Surgeon: Ralene Ok, MD;  Location: [place];  Service: General;  Laterality: N/A;   LAPAROTOMY N/A 03/02/2019   Procedure: EXPLORATORY LAPAROTOMY;  Surgeon: Ralene Ok, MD;  Location: [place];  Service: General;  Laterality: N/A;   LAPAROTOMY N/A 03/09/2021   Procedure: EXPLORATION OF ABDOMINAL WALL;  Surgeon: Ralene Ok, MD;   Location: [place];  Service: General;  Laterality: N/A;   LYSIS OF ADHESION N/A 06/12/2018   Procedure: LYSIS OF ADHESIONS;  Surgeon: [name], MD;  Location: [facility];  Service: General;  Laterality: N/A;   LYSIS OF ADHESION N/A 03/02/2019   Procedure: Lysis Of Adhesion;  Surgeon: Ralene Ok, MD;  Location: [place];  Service: General;  Laterality: N/A;   PORTACATH PLACEMENT Right 03/07/2018   Procedure: INSERTION PORT-A-CATH RIGHT SUBCLAVIAN;  Surgeon: [name], MD;  Location: [place];  Service: General;  Laterality: Right;   PROCTOSCOPY N/A 06/12/2018   Procedure: RIGID PROCTOSCOPY;  Surgeon: [name], MD;  Location: [facility];  Service: General;  Laterality: N/A;   thumb surgery   2018   cyst removal    I have reviewed the social history and family history with the patient and they are unchanged from previous note.  ALLERGIES:  has No Known Allergies.  MEDICATIONS:  Current Outpatient Medications  Medication Sig Dispense Refill   ALPRAZolam (XANAX) 0.25 MG tablet TAKE 1 TABLET(0.25 MG) BY MOUTH AT BEDTIME AS NEEDED FOR ANXIETY 30 tablet 0   apixaban (ELIQUIS) 5 MG TABS tablet TAKE 1 TABLET(5 MG) BY MOUTH TWICE DAILY 60 tablet 5   b complex vitamins tablet Take 1 tablet by mouth daily.     dexamethasone (DECADRON) 4 MG tablet Take 2 tablets (8 mg total) by mouth daily. Start the day after chemotherapy for 2 days. Take with food. 8 tablet 5   diltiazem (CARDIZEM CD) 360 MG 24 hr capsule Take 1 capsule (360 mg total) by mouth daily. 90 capsule 0   diphenoxylate-atropine (LOMOTIL) 2.5-0.025 MG tablet Take 1-2 tablets by mouth 4 (four) times daily as needed for diarrhea or loose stools. 60 tablet 1   eszopiclone (LUNESTA) 1 MG TABS tablet Take 1 tablet (1 mg total) by mouth at bedtime as needed for sleep. Take immediately before bedtime 30 tablet 2   finasteride (PROSCAR) 5 MG tablet Take 5 mg by mouth daily.     levothyroxine (SYNTHROID) 75 MCG tablet TAKE 1 TABLET(75 MCG) BY MOUTH  DAILY 90 tablet 1   lidocaine-prilocaine (EMLA) cream Apply 1 application topically as needed. 30 g 1   lidocaine-prilocaine (EMLA) cream Apply to affected area once 30 g 3   Multiple Vitamins-Iron (MULTIVITAMIN/IRON PO) Take 1 tablet by mouth daily.      ondansetron (ZOFRAN) 8 MG tablet Take 1 tablet (8 mg total) by mouth every 8 (eight) hours as needed for nausea, vomiting or refractory nausea / vomiting. Start on the third day after chemotherapy. 30 tablet 1   Oxycodone HCl 10 MG TABS Take 1 tablet (10 mg total) by mouth every 6 (six) hours as needed. 90 tablet 0   prochlorperazine (COMPAZINE) 10 MG tablet Take 1 tablet (10 mg total) by mouth every 6 (six) hours as needed for nausea or vomiting. 30 tablet 1   tamsulosin (FLOMAX) 0.4 MG CAPS capsule Take 0.4 mg by mouth 2 (two) times daily.      No current facility-administered medications for this visit.    PHYSICAL EXAMINATION: ECOG PERFORMANCE STATUS: 1 - Symptomatic but completely ambulatory  Vitals:   06/02/22 1027  BP: 128/69  Pulse: 62  Resp:  16  SpO2: 98%   Filed Weights   06/02/22 1027  Weight: 212 lb 9 oz (96.4 kg)    GENERAL:alert, no distress and comfortable SKIN: no rash  EYES: sclera clear OROPHARYNX: no thrush or ulcers LUNGS:  normal breathing effort HEART:  no lower extremity edema NEURO: alert & oriented x 3 with fluent speech, no focal motor/sensory deficits PAC without erythema   LABORATORY DATA:  I have reviewed the data as listed    Latest Ref Rng & Units 06/02/2022    9:58 AM 05/13/2022    8:59 AM 03/17/2022    8:44 AM  CBC  WBC 4.0 - 10.5 K/uL 4.9  6.9  5.6   Hemoglobin 13.0 - 17.0 g/dL 13.6  14.2  14.0   Hematocrit 39.0 - 52.0 % 40.8  42.5  40.3   Platelets 150 - 400 K/uL 204  218  208         Latest Ref Rng & Units 06/02/2022    9:58 AM 05/13/2022    8:59 AM 03/17/2022    8:44 AM  CMP  Glucose 70 - 99 mg/dL 121  105  132   BUN 8 - 23 mg/dL _0 Creatinine 0.61 - 1.24 mg/dL  1.13  1.02  1.12   Sodium 135 - 145 mmol/L 142  142  140   Potassium 3.5 - 5.1 mmol/L 3.5  3.9  3.7   Chloride 98 - 111 mmol/L 104  107  105   CO2 22 - 32 mmol/L _1 Calcium 8.9 - 10.3 mg/dL 9.0  9.6  8.9   Total Protein 6.5 - 8.1 g/dL 6.8  6.9  6.8   Total Bilirubin 0.3 - 1.2 mg/dL 0.5  0.6  1.0   Alkaline Phos 38 - 126 U/L 110  114  115   AST 15 - 41 U/L _2 ALT 0 - 44 U/L _3 RADIOGRAPHIC STUDIES: I have personally reviewed the radiological images as listed and agreed with the findings in the report. No results found.   ASSESSMENT & PLAN: CHRISTIN MCCREEDY is a 76 y.o. male with      1. Cancer of left colon, adenocarcinoma, stage IIIB (pT3N1cM0), MSI-stable, KRAS+ -Diagnosed in 01/2018. Treated with surgery and adjuvant FOLFOX. Due to side effects Oxaliplatin was stopped after 3 cycles and chemo was stopped after 6 cycles. He declined completing 6 months of chemo treatment.  -unfortunately, he developed local recurrence in left abdominal wall and peritoneal metastasis, 2 hypermetabolic liver lets in 09/1581. Abdominal wall biopsy conformed metastatic colorectal adenocarcinoma in 01/2020  -Foundation One showed Kras (+) positive, he is not a candidate for EGFR inhibitor.   -S/p FOLFIRI/beva, CapeOx/beva, Xeloda/Beva, and multiple abdominal surgeries -Mr. Dennard appears stable. S/p cycle 1 FOLFIRI/beva 05/13/22, he did not tolerate well with n/v x1, mild mucositis, and significant diarrhea lasting more than a week. Zofran was effective for n/v and Lomotil was added, he eventually recovered. Cycle 2 was postponed. He declined magic mouthwash since mucositis was mild.  -Pt has recovered well. Abdominal pain has improved, which is a good clinical indicator  -Labs reviewed, CEA is pending.  Adequate to proceed with cycle 2 FOLFIRI/bevacizumab today as planned, we will dose reduced irinotecan to 140 mg/m2 -He has 2 work trips coming up and is requesting to do  chemo q3 weeks x 2  then get back on q2 wk schedule.  We discussed this chemo is most effective when given q2 weeks but will accommodate his request -F/up in 3 weeks with cycle 3  2.  Abdominal wall pain -Secondary to metastatic disease/deposits -Occurs when he stands or walks for 10 minutes or so -managed with oxycodone every 3-4 hours -improved after cycle 1 FOLFIRI/beva  3.  Anxiety -He developed anxiety with diagnosis, fluctuates; secondary to #1, work, and other stressors -Johnnye Sima has helped him sleep so he is not up at night as much thinking about things, but interested in something for the daytime -Not ideal candidate for benzo given that he takes oxycodone every 3-4 hours -I recommended SSRI such as sertraline, he declined due to the potential side effect profile and having to be weaned off if he wants to stop in the future -He was referred to social work for mental health    4. Atrial Fibrillation -continue Eliquis. Rate controlled.  He can hold Eliquis periodically for bleeding hemorrhoids and surgeries -Continue follow-up with cardiology   PLAN: -Labs reviewed, CEA is pending -Proceed with cycle 2 dose-reduced FOLFIRI/beva -Reviewed symptom management -Will adjust schedule to chemo q3 weeks x2 for back to back work trips -F/up each cycle     Orders Placed This Encounter  Procedures   CEA (Access)-[facility] ONLY    Standing Status:   Standing    Number of Occurrences:   20    Standing Expiration Date:   06/03/2023   All questions were answered. The patient knows to call the clinic with any problems, questions or concerns. No barriers to learning was detected. I spent 20 minutes counseling the patient face to face. The total time spent in the appointment was 30 minutes and more than 50% was on counseling and review of test results and coordination of care.     Alla Feeling, NP 06/02/22

## 2022-06-01 MED FILL — Dexamethasone Sodium Phosphate Inj 100 MG/10ML: INTRAMUSCULAR | Qty: 1 | Status: AC

## 2022-06-02 ENCOUNTER — Inpatient Hospital Stay: Payer: BC Managed Care – PPO

## 2022-06-02 ENCOUNTER — Encounter: Payer: Self-pay | Admitting: Nurse Practitioner

## 2022-06-02 ENCOUNTER — Inpatient Hospital Stay (HOSPITAL_BASED_OUTPATIENT_CLINIC_OR_DEPARTMENT_OTHER): Payer: BC Managed Care – PPO | Admitting: Nurse Practitioner

## 2022-06-02 DIAGNOSIS — Z5111 Encounter for antineoplastic chemotherapy: Secondary | ICD-10-CM | POA: Diagnosis not present

## 2022-06-02 DIAGNOSIS — C186 Malignant neoplasm of descending colon: Secondary | ICD-10-CM | POA: Diagnosis not present

## 2022-06-02 DIAGNOSIS — C786 Secondary malignant neoplasm of retroperitoneum and peritoneum: Secondary | ICD-10-CM | POA: Diagnosis not present

## 2022-06-02 DIAGNOSIS — C787 Secondary malignant neoplasm of liver and intrahepatic bile duct: Secondary | ICD-10-CM | POA: Diagnosis not present

## 2022-06-02 DIAGNOSIS — G893 Neoplasm related pain (acute) (chronic): Secondary | ICD-10-CM | POA: Diagnosis not present

## 2022-06-02 DIAGNOSIS — Z5112 Encounter for antineoplastic immunotherapy: Secondary | ICD-10-CM | POA: Diagnosis not present

## 2022-06-02 DIAGNOSIS — Z452 Encounter for adjustment and management of vascular access device: Secondary | ICD-10-CM | POA: Diagnosis not present

## 2022-06-02 DIAGNOSIS — I4891 Unspecified atrial fibrillation: Secondary | ICD-10-CM | POA: Diagnosis not present

## 2022-06-02 DIAGNOSIS — Z95828 Presence of other vascular implants and grafts: Secondary | ICD-10-CM

## 2022-06-02 DIAGNOSIS — Z7901 Long term (current) use of anticoagulants: Secondary | ICD-10-CM | POA: Diagnosis not present

## 2022-06-02 LAB — CBC WITH DIFFERENTIAL (CANCER CENTER ONLY)
Abs Immature Granulocytes: 0.01 10*3/uL (ref 0.00–0.07)
Basophils Absolute: 0 10*3/uL (ref 0.0–0.1)
Basophils Relative: 1 %
Eosinophils Absolute: 0.1 10*3/uL (ref 0.0–0.5)
Eosinophils Relative: 3 %
HCT: 40.8 % (ref 39.0–52.0)
Hemoglobin: 13.6 g/dL (ref 13.0–17.0)
Immature Granulocytes: 0 %
Lymphocytes Relative: 42 %
Lymphs Abs: 2 10*3/uL (ref 0.7–4.0)
MCH: 30.3 pg (ref 26.0–34.0)
MCHC: 33.3 g/dL (ref 30.0–36.0)
MCV: 90.9 fL (ref 80.0–100.0)
Monocytes Absolute: 0.5 10*3/uL (ref 0.1–1.0)
Monocytes Relative: 9 %
Neutro Abs: 2.2 10*3/uL (ref 1.7–7.7)
Neutrophils Relative %: 45 %
Platelet Count: 204 10*3/uL (ref 150–400)
RBC: 4.49 MIL/uL (ref 4.22–5.81)
RDW: 14.6 % (ref 11.5–15.5)
WBC Count: 4.9 10*3/uL (ref 4.0–10.5)
nRBC: 0 % (ref 0.0–0.2)

## 2022-06-02 LAB — CMP (CANCER CENTER ONLY)
ALT: 18 U/L (ref 0–44)
AST: 21 U/L (ref 15–41)
Albumin: 3.9 g/dL (ref 3.5–5.0)
Alkaline Phosphatase: 110 U/L (ref 38–126)
Anion gap: 7 (ref 5–15)
BUN: 15 mg/dL (ref 8–23)
CO2: 31 mmol/L (ref 22–32)
Calcium: 9 mg/dL (ref 8.9–10.3)
Chloride: 104 mmol/L (ref 98–111)
Creatinine: 1.13 mg/dL (ref 0.61–1.24)
GFR, Estimated: >60 mL/min (ref >60)
Glucose, Bld: 121 mg/dL — ABNORMAL HIGH (ref 70–99)
Potassium: 3.5 mmol/L (ref 3.5–5.1)
Sodium: 142 mmol/L (ref 135–145)
Total Bilirubin: 0.5 mg/dL (ref 0.3–1.2)
Total Protein: 6.8 g/dL (ref 6.5–8.1)

## 2022-06-02 LAB — TOTAL PROTEIN, URINE DIPSTICK: Protein, ur: 100 mg/dL — AB

## 2022-06-02 LAB — CEA (ACCESS): CEA (CHCC): 46.89 ng/mL — ABNORMAL HIGH (ref 0.00–5.00)

## 2022-06-02 MED ORDER — PALONOSETRON HCL INJECTION 0.25 MG/5ML
0.2500 mg | Freq: Once | INTRAVENOUS | Status: AC
  Administered 2022-06-02: 0.25 mg via INTRAVENOUS
  Filled 2022-06-02: qty 5

## 2022-06-02 MED ORDER — SODIUM CHLORIDE 0.9 % IV SOLN
5.0000 mg/kg | Freq: Once | INTRAVENOUS | Status: AC
  Administered 2022-06-02: 500 mg via INTRAVENOUS
  Filled 2022-06-02: qty 4

## 2022-06-02 MED ORDER — SODIUM CHLORIDE 0.9% FLUSH
10.0000 mL | INTRAVENOUS | Status: DC | PRN
  Administered 2022-06-02: 10 mL

## 2022-06-02 MED ORDER — SODIUM CHLORIDE 0.9 % IV SOLN
2232.0000 mg/m2 | INTRAVENOUS | Status: DC
  Administered 2022-06-02: 5000 mg via INTRAVENOUS
  Filled 2022-06-02: qty 100

## 2022-06-02 MED ORDER — SODIUM CHLORIDE 0.9 % IV SOLN
140.0000 mg/m2 | Freq: Once | INTRAVENOUS | Status: AC
  Administered 2022-06-02: 320 mg via INTRAVENOUS
  Filled 2022-06-02: qty 15

## 2022-06-02 MED ORDER — ATROPINE SULFATE 1 MG/ML IV SOLN
0.5000 mg | Freq: Once | INTRAVENOUS | Status: AC | PRN
  Administered 2022-06-02: 0.5 mg via INTRAVENOUS
  Filled 2022-06-02: qty 1

## 2022-06-02 MED ORDER — SODIUM CHLORIDE 0.9 % IV SOLN: Freq: Once | INTRAVENOUS | Status: AC

## 2022-06-02 MED ORDER — SODIUM CHLORIDE 0.9 % IV SOLN
400.0000 mg/m2 | Freq: Once | INTRAVENOUS | Status: AC
  Administered 2022-06-02: 896 mg via INTRAVENOUS
  Filled 2022-06-02: qty 44.8

## 2022-06-02 MED ORDER — SODIUM CHLORIDE 0.9 % IV SOLN
10.0000 mg | Freq: Once | INTRAVENOUS | Status: AC
  Administered 2022-06-02: 10 mg via INTRAVENOUS
  Filled 2022-06-02: qty 1
  Filled 2022-06-02: qty 10

## 2022-06-02 NOTE — Progress Notes (Signed)
Per Regan Rakers NP OK to proceed with tx today with Urine Protein 100 mg/dL

## 2022-06-02 NOTE — Patient Instructions (Signed)
[facility]  Discharge Instructions: Thank you for choosing [facility] to provide your oncology and hematology care.   If you have a lab appointment with the [facility], please go directly to the [facility] and check in at the registration area.   Wear comfortable clothing and clothing appropriate for easy access to any Portacath or PICC line.   We strive to give you quality time with your provider. You may need to reschedule your appointment if you arrive late (15 or more minutes).  Arriving late affects you and other patients whose appointments are after yours.  Also, if you miss three or more appointments without notifying the office, you may be dismissed from the clinic at the provider's discretion.      For prescription refill requests, have your pharmacy contact our office and allow 72 hours for refills to be completed.    Today you received the following chemotherapy and/or immunotherapy agents: Vegzelma/Irinotecan/Leucovorin/Fluorouracil      To help prevent nausea and vomiting after your treatment, we encourage you to take your nausea medication as directed.  BELOW ARE SYMPTOMS THAT SHOULD BE REPORTED IMMEDIATELY: *FEVER GREATER THAN 100.4 F (38 C) OR HIGHER *CHILLS OR SWEATING *NAUSEA AND VOMITING THAT IS NOT CONTROLLED WITH YOUR NAUSEA MEDICATION *UNUSUAL SHORTNESS OF BREATH *UNUSUAL BRUISING OR BLEEDING *URINARY PROBLEMS (pain or burning when urinating, or frequent urination) *BOWEL PROBLEMS (unusual diarrhea, constipation, pain near the anus) TENDERNESS IN MOUTH AND THROAT WITH OR WITHOUT PRESENCE OF ULCERS (sore throat, sores in mouth, or a toothache) UNUSUAL RASH, SWELLING OR PAIN  UNUSUAL VAGINAL DISCHARGE OR ITCHING   Items with * indicate a potential emergency and should be followed up as soon as possible or go to the Emergency Department if any problems should occur.  Please show the CHEMOTHERAPY ALERT CARD or  IMMUNOTHERAPY ALERT CARD at check-in to the Emergency Department and triage nurse.  Should you have questions after your visit or need to cancel or reschedule your appointment, please contact [facility]  Dept: [phone]  and follow the prompts.  Office hours are 8:00 a.m. to 4:30 p.m. Monday - Friday. Please note that voicemails left after 4:00 p.m. may not be returned until the following business day.  We are closed weekends and major holidays. You have access to a nurse at all times for urgent questions. Please call the main number to the clinic Dept: [phone] and follow the prompts.   For any non-urgent questions, you may also contact your provider using MyChart. We now offer e-Visits for anyone 26 and older to request care online for non-urgent symptoms. For details visit [email or web address].   Also download the MyChart app! Go to the app store, search "MyChart", open the app, select [facility], and log in with your MyChart username and password.

## 2022-06-03 ENCOUNTER — Telehealth: Payer: Self-pay | Admitting: Nurse Practitioner

## 2022-06-03 ENCOUNTER — Telehealth: Payer: Self-pay | Admitting: Hematology

## 2022-06-03 NOTE — Telephone Encounter (Signed)
Unable to leave patient a voicemail because not set up

## 2022-06-03 NOTE — Telephone Encounter (Signed)
Left patient a voicemail regarding appointment change  

## 2022-06-04 ENCOUNTER — Other Ambulatory Visit: Payer: Self-pay

## 2022-06-04 ENCOUNTER — Inpatient Hospital Stay: Payer: BC Managed Care – PPO

## 2022-06-04 VITALS — BP 124/70 | HR 58 | Temp 97.9°F | Resp 18

## 2022-06-04 DIAGNOSIS — Z5112 Encounter for antineoplastic immunotherapy: Secondary | ICD-10-CM | POA: Diagnosis not present

## 2022-06-04 DIAGNOSIS — C186 Malignant neoplasm of descending colon: Secondary | ICD-10-CM

## 2022-06-04 DIAGNOSIS — C786 Secondary malignant neoplasm of retroperitoneum and peritoneum: Secondary | ICD-10-CM | POA: Diagnosis not present

## 2022-06-04 DIAGNOSIS — I4891 Unspecified atrial fibrillation: Secondary | ICD-10-CM | POA: Diagnosis not present

## 2022-06-04 DIAGNOSIS — Z5111 Encounter for antineoplastic chemotherapy: Secondary | ICD-10-CM | POA: Diagnosis not present

## 2022-06-04 DIAGNOSIS — G893 Neoplasm related pain (acute) (chronic): Secondary | ICD-10-CM | POA: Diagnosis not present

## 2022-06-04 DIAGNOSIS — C787 Secondary malignant neoplasm of liver and intrahepatic bile duct: Secondary | ICD-10-CM | POA: Diagnosis not present

## 2022-06-04 DIAGNOSIS — Z452 Encounter for adjustment and management of vascular access device: Secondary | ICD-10-CM | POA: Diagnosis not present

## 2022-06-04 DIAGNOSIS — Z7901 Long term (current) use of anticoagulants: Secondary | ICD-10-CM | POA: Diagnosis not present

## 2022-06-04 MED ORDER — SODIUM CHLORIDE 0.9% FLUSH
10.0000 mL | INTRAVENOUS | Status: DC | PRN
  Administered 2022-06-04: 10 mL

## 2022-06-04 MED ORDER — HEPARIN SOD (PORK) LOCK FLUSH 100 UNIT/ML IV SOLN
500.0000 [IU] | Freq: Once | INTRAVENOUS | Status: AC | PRN
  Administered 2022-06-04: 500 [IU]

## 2022-06-07 ENCOUNTER — Encounter: Payer: Self-pay | Admitting: Hematology

## 2022-06-08 ENCOUNTER — Telehealth: Payer: Self-pay | Admitting: Nurse Practitioner

## 2022-06-08 ENCOUNTER — Other Ambulatory Visit: Payer: Self-pay | Admitting: Nurse Practitioner

## 2022-06-08 DIAGNOSIS — C186 Malignant neoplasm of descending colon: Secondary | ICD-10-CM

## 2022-06-08 NOTE — Telephone Encounter (Signed)
S/p cycle 2 FOLFIRI/beva with dose reduced irinotecan on 12/27, pt having 10-12 episodes of watery diarrhea, worse than cycle 1 despite dose reduction. He is using 1-2 tabs lomotil 4 times a day. He is trying to stay hydrated. He agrees to lab/flush and fluids this week, prefers 1/3.   In the meantime I recommend to continue lomotil and add imodium, starting with 2 tabs at first episode then 1-2 depending on volume, up to 8 tabs in 24 hours. He would also like to discuss further dose reduction vs different chemo. Will let Dr. Burr Medico know.   F/up and next cycle 1/11 as of now.   Cira Rue, NP

## 2022-06-09 ENCOUNTER — Inpatient Hospital Stay (HOSPITAL_BASED_OUTPATIENT_CLINIC_OR_DEPARTMENT_OTHER): Payer: BC Managed Care – PPO | Admitting: Hematology

## 2022-06-09 ENCOUNTER — Encounter: Payer: Self-pay | Admitting: Hematology

## 2022-06-09 ENCOUNTER — Inpatient Hospital Stay: Payer: BC Managed Care – PPO | Attending: Nurse Practitioner

## 2022-06-09 ENCOUNTER — Other Ambulatory Visit: Payer: BC Managed Care – PPO

## 2022-06-09 VITALS — BP 113/60 | HR 76 | Temp 98.3°F | Resp 16 | Wt 201.5 lb

## 2022-06-09 DIAGNOSIS — C186 Malignant neoplasm of descending colon: Secondary | ICD-10-CM | POA: Diagnosis not present

## 2022-06-09 DIAGNOSIS — G893 Neoplasm related pain (acute) (chronic): Secondary | ICD-10-CM | POA: Insufficient documentation

## 2022-06-09 DIAGNOSIS — K5730 Diverticulosis of large intestine without perforation or abscess without bleeding: Secondary | ICD-10-CM | POA: Insufficient documentation

## 2022-06-09 DIAGNOSIS — C786 Secondary malignant neoplasm of retroperitoneum and peritoneum: Secondary | ICD-10-CM | POA: Insufficient documentation

## 2022-06-09 DIAGNOSIS — Z452 Encounter for adjustment and management of vascular access device: Secondary | ICD-10-CM | POA: Insufficient documentation

## 2022-06-09 DIAGNOSIS — Z5111 Encounter for antineoplastic chemotherapy: Secondary | ICD-10-CM | POA: Diagnosis not present

## 2022-06-09 DIAGNOSIS — Z79899 Other long term (current) drug therapy: Secondary | ICD-10-CM | POA: Insufficient documentation

## 2022-06-09 DIAGNOSIS — C787 Secondary malignant neoplasm of liver and intrahepatic bile duct: Secondary | ICD-10-CM | POA: Diagnosis not present

## 2022-06-09 DIAGNOSIS — Z5112 Encounter for antineoplastic immunotherapy: Secondary | ICD-10-CM | POA: Insufficient documentation

## 2022-06-09 DIAGNOSIS — E860 Dehydration: Secondary | ICD-10-CM | POA: Insufficient documentation

## 2022-06-09 DIAGNOSIS — R197 Diarrhea, unspecified: Secondary | ICD-10-CM | POA: Diagnosis not present

## 2022-06-09 DIAGNOSIS — Z95828 Presence of other vascular implants and grafts: Secondary | ICD-10-CM

## 2022-06-09 LAB — CBC WITH DIFFERENTIAL (CANCER CENTER ONLY)
Abs Immature Granulocytes: 0.04 10*3/uL (ref 0.00–0.07)
Basophils Absolute: 0 10*3/uL (ref 0.0–0.1)
Basophils Relative: 0 %
Eosinophils Absolute: 0.2 10*3/uL (ref 0.0–0.5)
Eosinophils Relative: 3 %
HCT: 44.8 % (ref 39.0–52.0)
Hemoglobin: 15.3 g/dL (ref 13.0–17.0)
Immature Granulocytes: 1 %
Lymphocytes Relative: 29 %
Lymphs Abs: 2.2 10*3/uL (ref 0.7–4.0)
MCH: 30.2 pg (ref 26.0–34.0)
MCHC: 34.2 g/dL (ref 30.0–36.0)
MCV: 88.5 fL (ref 80.0–100.0)
Monocytes Absolute: 0.3 10*3/uL (ref 0.1–1.0)
Monocytes Relative: 4 %
Neutro Abs: 4.6 10*3/uL (ref 1.7–7.7)
Neutrophils Relative %: 63 %
Platelet Count: 221 10*3/uL (ref 150–400)
RBC: 5.06 MIL/uL (ref 4.22–5.81)
RDW: 14.4 % (ref 11.5–15.5)
WBC Count: 7.4 10*3/uL (ref 4.0–10.5)
nRBC: 0 % (ref 0.0–0.2)

## 2022-06-09 LAB — CMP (CANCER CENTER ONLY)
ALT: 111 U/L — ABNORMAL HIGH (ref 0–44)
AST: 40 U/L (ref 15–41)
Albumin: 3.7 g/dL (ref 3.5–5.0)
Alkaline Phosphatase: 131 U/L — ABNORMAL HIGH (ref 38–126)
Anion gap: 8 (ref 5–15)
BUN: 30 mg/dL — ABNORMAL HIGH (ref 8–23)
CO2: 25 mmol/L (ref 22–32)
Calcium: 8.6 mg/dL — ABNORMAL LOW (ref 8.9–10.3)
Chloride: 104 mmol/L (ref 98–111)
Creatinine: 1.23 mg/dL (ref 0.61–1.24)
GFR, Estimated: >60 mL/min (ref >60)
Glucose, Bld: 100 mg/dL — ABNORMAL HIGH (ref 70–99)
Potassium: 3.3 mmol/L — ABNORMAL LOW (ref 3.5–5.1)
Sodium: 137 mmol/L (ref 135–145)
Total Bilirubin: 1.7 mg/dL — ABNORMAL HIGH (ref 0.3–1.2)
Total Protein: 6.2 g/dL — ABNORMAL LOW (ref 6.5–8.1)

## 2022-06-09 LAB — MAGNESIUM: Magnesium: 2.1 mg/dL (ref 1.7–2.4)

## 2022-06-09 MED ORDER — POTASSIUM CHLORIDE IN NACL 20-0.9 MEQ/L-% IV SOLN
INTRAVENOUS | Status: AC
  Filled 2022-06-09: qty 1000

## 2022-06-09 MED ORDER — SODIUM CHLORIDE 0.9% FLUSH
10.0000 mL | INTRAVENOUS | Status: DC | PRN
  Administered 2022-06-09: 10 mL

## 2022-06-09 MED ORDER — SODIUM CHLORIDE 0.9 % IV SOLN: Freq: Once | INTRAVENOUS | Status: AC

## 2022-06-09 MED ORDER — HEPARIN SOD (PORK) LOCK FLUSH 100 UNIT/ML IV SOLN
500.0000 [IU] | Freq: Once | INTRAVENOUS | Status: AC | PRN
  Administered 2022-06-09: 500 [IU]

## 2022-06-09 NOTE — Progress Notes (Signed)
[place]   [phone]) [phone] Fax:(336) [phone]   Clinic Follow up Note   Patient Care Team: [name], MD as PCP - General (Hematology) [name], MD as Consulting Physician (Cardiology) [name], MD as Consulting Physician (Gastroenterology) [name], MD as Consulting Physician (General Surgery) [name], MD as Consulting Physician (General Surgery) [name], MD as Consulting Physician (Urology) [name], MD as Consulting Physician (Medical Oncology)  Date of Service:  06/09/2022  CHIEF COMPLAINT: f/u of metastatic colon cancer   CURRENT THERAPY:   FOLFIRI/bevacizumab every 2 weeks, starting 05/13/2022   ASSESSMENT:  Dave Vaughn is a 77 y.o. male with   Diarrhea and dehydration -Secondary to chemotherapy, especially irinotecan. -Will check stool GI panel, to rule out infection -We discussed oral liquid and electrolyte replacement, such as liquid IV -Will give normal saline 1 L today, IV potassium 20 meq, and additional IV fluids later this week  2. 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 S/p surgery and adjuvant FOLFOX 6 cycles . He did not tolerate well and declined additional treatment at that time. -restaging in 12/2019 revealed local recurrence in left abdominal wall and peritoneal metastasis, 2 hypermetabolic liver mets. Abdominal wall biopsy confirmed metastatic colorectal adenocarcinoma in 01/2020  -FO showed KRAS mutation (+), MSS -s/p CAPOX+beva, xeloda+beva and he had multiple abdominal surgery  -due to recent cancer progression, plan to change to FOLFIRI and beva -Due to severe diarrhea, irinotecan dosage was reduced for cycle 2, but he developed worsening diarrhea. -We discussed reduce irinotecan dose further, versus holding irinotecan for cycle 3 due to his travel plan after cycle 3      PLAN: - Discuss testing stool for GI panel  -Give IV fluids and KCL  - Suggest taking Liquid IV -refill Lomotil -Lab,f/u and treatment 06/17/22, may hold irinotecan  -urgent nutrition consult    SUMMARY OF ONCOLOGIC HISTORY: Oncology History Overview Note   Cancer Staging  Cancer of left colon [facility]) Staging form: Colon and Rectum, AJCC 8th Edition - Pathologic stage from 01/11/2018: Stage IIIB (pT3, pN1c, cM0) - Signed by [name], MD on 01/16/2018 Total positive nodes: 0 Histologic grading system: 4 grade system Histologic grade (G): G2     Cancer of left colon ([place])  01/10/2018 Imaging   CT AP W Contrast 01/10/18  IMPRESSION: Irregular soft tissue density causing stricture of the mid descending colon likely the site of obstruction for the dilated small bowel. 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 No evidence of perforation.   Equivocal findings involving the appendix measuring 1.2 cm at the appendiceal tip with mucosal enhancement. No adjacent free fluid or inflammatory change. Findings are nonspecific, but can be seen with early acute appendicitis.   Mild prostatic enlargement. Increased density over the posterior bladder base likely due to the large prostatic impression although cannot completely exclude a bladder mass. Urology protocol CT or ultrasound may be helpful for better evaluation.   Mild cholelithiasis.   Stable 1.5 cm cystic structure over the lower pole right kidney likely slightly hyperdense cyst.   Diverticulosis of the colon.   Aortic Atherosclerosis (ICD10-I70.0).   01/11/2018 Cancer Staging   Staging form: Colon and Rectum, AJCC 8th Edition - Pathologic stage from 01/11/2018: Stage IIIB (pT3, pN1c, cM0) - Signed by [name], MD on 01/16/2018   01/11/2018 Surgery   LEFT COLON RESECTION, TAKEDOWN SPLENIC FLEXURE, COLOSTOMY  by Dr. [name]    01/11/2018 Procedure   Colonoscopy 01/11/18 by Dr. [name]  - Malignant completely obstructing tumor in the mid  descending colon. Tattooed. - Diverticulosis in the sigmoid colon. - Non-bleeding internal hemorrhoids. - No specimens collected.   01/11/2018 Pathology Results   Diagnosis 01/11/18  1. Colon, segmental resection for tumor, descending colon - INVASIVE COLORECTAL ADENOCARCINOMA, 4 CM. - TUMOR EXTENDS INTO PERICOLONIC CONNECTIVE TISSUE. - TUMOR FOCALLY INVOLVES RADIAL MARGIN. - ONE MESENTERIC TUMOR DEPOSIT. - THIRTEEN BENIGN LYMPH NODES (0/13). 2. Colon, segmental resection, splenic flexure - BENIGN COLON. - NO EVIDENCE OF MALIGNANCY .   01/11/2018 Tumor Marker   Baseline CEA at 3.4   01/16/2018 Initial Diagnosis   Cancer of left colon ([place])   01/23/2018 Imaging   CT CHEST WO CONTRAST IMPRESSION: 1. No evidence for metastatic disease within the chest. 2. Small left pleural effusion with underlying opacities which may represent atelectasis. Right basilar atelectasis. 3. Few foci of gas within the upper abdomen in the omentum with surrounding fat stranding, likely postsurgical 4. Aortic Atherosclerosis (ICD10-I70.0).   03/08/2018 - 05/15/2018 Chemotherapy   adjuvant FOLOFX. Due to side effects of neuropahty Oxaliplatin was stopped after 3 cycles and chemo was stopped after 6 cycles. He declined completing 6 months of chemo treatment.     03/19/2018 Imaging   03/19/2018 CT AP IMPRESSION: 1. Interval partial left hemicolectomy and descending colostomy. 2. Heterogeneous soft tissue density along the left anterior renal fascia is most likely postoperative (favor fat necrosis). No well-defined fluid collection. 3. Mild left lower quadrant edema, new since 01/10/2018. This could be postoperative. Superimposed sigmoid diverticulitis and/or cystitis cannot be excluded. 4. Subtle hyperenhancing nodule within the anterior bladder dome cannot be excluded. Consider nonemergent cystoscopy. When this is performed, recommend attention to the left ureterovesicular junction and distal left ureter  to evaluate questionable soft tissue fullness. 5. Cholelithiasis. 6.  Aortic Atherosclerosis (ICD10-I70.0). 7. Prostatomegaly.   11/13/2018 Imaging   CT CAP WO Contrast 11/13/18  IMPRESSION: 1. Reversal of left lower quadrant colostomy with sigmoid colon anastomosis. No complicating features. No findings for residual or recurrent tumor or metastatic disease involving the chest, abdomen or pelvis without contrast. 2. No acute abdominal/pelvic findings. 3. Gallbladder sludge and gallstones but no findings for acute cholecystitis. 4. Stable anterior abdominal wall hernia. 5. The right testicle is in the right inguinal canal.   10/03/2019 Imaging   CT CAP WO contrast  IMPRESSION: 1. New rounded density interposed between the prostate gland and anterior upper rectal wall could represent adenopathy or local extension of anterior rectal tumor. 2. Marked prostatomegaly, prostate volume 150 cubic cm. 3. Other imaging findings of potential clinical significance: Aortic Atherosclerosis (ICD10-I70.0). Coronary atherosclerosis. Trace right pleural effusion. Suspected cholelithiasis. Nonobstructive left nephrolithiasis. Multilevel lumbar impingement. Bilateral mildly retracted testicles. Hypodense exophytic lesion of the right kidney, most likely to be a cyst.   11/02/2019 Procedure   colonoscopy on 11/02/2019 by Dr. [name] Plan showed normal digital rectal exam, 3 polyps in the rectum, descending colon and cecum, and a prior sigmoid: Anastomosis characterized by erythema.  He found an extrinsic nonobstructing medium-sized mass in the proximal rectum about 4 cm in length, no internal rectal mass.   Diagnosis Surgical [P], colon, cecum, descending, rectal, polyp (3) - TUBULAR ADENOMA (TWO) - NO HIGH GRADE DYSPLASIA OR CARCINOMA. - COLONIC FRAGMENT WITH BENIGN LYMPHOID AGGREGATE.   12/07/2019 Imaging   MRI pelvis IMPRESSION: 1. Masslike area in the rectum suspicious for rectal neoplasm, likely T4b  based on the appearance of soft tissue extending along the anterior peritoneal reflection and into the seminal vesicles. Correlation with recent colonoscopy results may be helpful. Area of anastomosis and other areas of the pelvis are not imaged on today's exam.   12/21/2019 PET scan   IMPRESSION: 1. Unfortunately evidence for peritoneal metastasis. Intensely hypermetabolic nodules along the LEFT pericolic gutter. Favor hypermetabolic mass anterior to the rectum to represent serosal implant along the ventral surface of the rectum. 2. local recurrence within the LEFT abdominal wall at site prior colostomy. Intense hypermetabolic thickening of the rectus muscle at this site. 3. Two hypermetabolic hepatic metastasis.   01/15/2020 Relapse/Recurrence   FINAL MICROSCOPIC DIAGNOSIS:   A. SOFT TISSUE, LEFT ABDOMINAL WALL, BIOPSY:  - Adenocarcinoma.  - See comment.   COMMENT:   The morphology is consistent with metastatic colorectal adenocarcinoma.    01/28/2020 -  Chemotherapy   First line FOLFIRI q2weeks starting 01/28/20 for 8 cycles.  -----Bevacizumab added with C2.  -----Changed to maintenance Xeloda 2000 mg twice daily for 2 weeks on/1 week off and bevacizumab 06/02/20. Starting with C3, dose reduce to 1579m BID due to skin toxicity.   05/05/2020 Imaging   IMPRESSION: 1. Improved appearance, with reduced size of the hepatic metastatic lesions and reduced size of the peritoneal tumor implants. 2. Other imaging findings of potential clinical significance: Notable prostatomegaly. Multilevel impingement in the lumbar spine. Dependent density in the gallbladder possibly from sludge or gallstones. Degenerative glenohumeral arthropathy bilaterally. 3. Aortic atherosclerosis.   08/15/2020 Imaging   CT CAP  IMPRESSION: Chest Impression:   No evidence of thoracic metastasis   Abdomen / Pelvis Impression:   1. Hepatic metastasis are no longer measurable by CT imaging. 2. Peritoneal  nodular metastasis in the LEFT abdomen are decreased in size. 3. No evidence of new peritoneal disease. 4. Thickening and LEFT rectus muscle at site of prior metastasis. No interval change. 5. No evidence of new or progressive colorectal carcinoma.     11/27/2020 Imaging   CT CAP  IMPRESSION: 1. There are multiple small pulmonary nodules in the right upper lobe that are new or enlarged compared to prior examination but measuring 4 mm or smaller, suspicious for pulmonary metastatic disease. 2. Unchanged peritoneal nodule adjacent to the tip of the spleen measuring 1.3 x 1.2 cm. Slightly peritoneal nodule adjacent to the splenic flexure measuring no greater than 6 mm, previously 8 mm. 3. No significant change in previously hypermetabolic soft tissue mass centered about the left lower quadrant colostomy site. 4. Previously established hypermetabolic hepatic metastatic disease remains inapparent by CT. 5. Status post sigmoid colon resection and reanastomosis. 6. Prostatomegaly with thickening of the decompressed urinary bladder, likely secondary to chronic outlet obstruction. 7. Cholelithiasis.   Aortic Atherosclerosis (ICD10-I70.0).   01/29/2021 Imaging   CT CAP   IMPRESSION: 1. Minimal interval progression of bilateral pulmonary nodules, concerning for metastatic disease. 2. No substantial change in size of the peritoneal nodule at the inferior tip of the spleen and splenic flexure nodule. 3. Nodular soft tissue measured at the ostomy site previously is not evident today. 4. Marked prostatomegaly. 5. Cholelithiasis. 6. Aortic Atherosclerosis (ICD10-I70.0).   04/06/2021 Imaging   EXAM: CT ABDOMEN AND PELVIS WITHOUT CONTRAST (Renal Stone Protocol)  IMPRESSION: 1. Bladder wall thickening with surrounding inflammation concerning for cystitis. 2. Partial colectomy. Colon is air-filled with relative transition just proximal to the anastomosis. Stricture or recurrence at  this level would be difficult to exclude. No focal mass identified. 3.  There is a new subcutaneous fluid collection at the level of prior ostomy in the left abdominal wall. Infection not excluded. 4. There is new intramuscular hyperdensity at the level of the prior ostomy, indeterminate. Findings may related to scarring, tumor recurrence or metastatic disease is not excluded. 5. Stable nodularity in the left upper quadrant. 6. Cholelithiasis. 7. Trace right pleural effusion. 8. Stable prostatomegaly. 9.  Aortic Atherosclerosis (ICD10-I70.0).   05/05/2021 Pathology Results   FINAL MICROSCOPIC DIAGNOSIS:   A. ABDOMINAL WALL SCAR, EXCISION:  -  Adenocarcinoma  -  See comment   COMMENT:  Morphologically consistent with colonic adenocarcinoma (similar to  previously reported ([id number]).   06/02/2021 Imaging   EXAM: CT CHEST, ABDOMEN, AND PELVIS WITH CONTRAST  IMPRESSION: 1. Progressive multifocal pulmonary metastatic disease. 2. New low-density hepatic lesions, likely metastases. 3. Progressive peritoneal implant near the splenic flexure of the colon. Small peritoneal implant inferior to the spleen appears unchanged. 4. Increased soft tissue nodularity near the midline incision in the upper anterior abdominal wall, suspicious for tumor recurrence at the incision. This could reflect keloid formation. 5. Fluid collection in the left anterior abdominal wall attributed to recent abdominal surgery with associated small incisional hernia containing fat. 6. Additional incidental findings including prostatomegaly, cholelithiasis and Aortic Atherosclerosis (ICD10-I70.0).   06/11/2021 - 11/26/2021 Chemotherapy   Patient is on Treatment Plan : COLORECTALXeloda + Bevacizumab q21d     07/29/2021 Imaging   EXAMINATION: CT ABDOMEN PELVIS WO CONTRAST  Impression  1. Left anterior abdominal wall postsurgical changes with no recurrent hernia.  2. Stable indeterminate 3 cm ventral abdominal  wall mass located at the L1-2 level. Question scarring from prior surgery versus neoplasm.  3. Cholelithiasis.  4. Stable indeterminate bibasilar lung nodules. All of with a CT chest is recommended.  5. Indeterminate stable right hepatic lesions. Consider follow-up with an abdominal MRI with and without contrast for additional characterization.     12/17/2021 - 02/01/2022 Chemotherapy   Patient is on Treatment Plan : COLORECTAL CapeOx + Bevacizumab q21d     12/17/2021 - 03/17/2022 Chemotherapy   Patient is on Treatment Plan : COLORECTAL CapeOx + Bevacizumab q21d     05/13/2022 -  Chemotherapy   Patient is on Treatment Plan : COLORECTAL FOLFIRI + Bevacizumab q14d        INTERVAL HISTORY:  ROHIL LESCH is here for a follow up of metastatic colon cancer  He was last seen by NP Lacie on 06/02/22 He presents to the clinic daughter. Pt states that diarrhea is has been consistent. Pt states he has lost 20 lbs. He has been having chills, bloating and gas.Some nausea and he has been taking zofran and compazine.Daughter states of giving him a smoothie with protein.     All other systems were reviewed with the patient and are negative.  MEDICAL HISTORY:  Past Medical History:  Diagnosis Date   Adenocarcinoma, colon ([place]) dx'd 01/2018   Anemia    taking iron supplements   Anxiety    Atrial fibrillation with RVR ([facility])    Colonic obstruction ([place]) 01/10/2018   Depression    Diverticulitis    Dysrhythmia    afib   History of kidney stones    Hypertension    Indwelling Foley catheter present    due to UTI    SURGICAL HISTORY: Past Surgical History:  Procedure Laterality Date   ANKLE SURGERY Left    when he was in college   COLON RESECTION N/A 01/11/2018   Procedure:  LEFT COLON RESECTION, TAKEDOWN SPLENIC FLEXURE, COLOSTOMY;  Surgeon: [name], MD;  Location: [facility];  Service: General;  Laterality: N/A;   COLONOSCOPY  01/11/2018   Procedure: COLONOSCOPY;  Surgeon: Jackquline Denmark,  MD;  Location: [facility];  Service: Endoscopy;;   COLONOSCOPY  05/10/2018   colonscopy  05/10/2018   EXCISION OF KELOID N/A 05/05/2021   Procedure: ABDOMINAL SCAR EXCISION;  Surgeon: Ralene Ok, MD;  Location: [place];  Service: General;  Laterality: N/A;   FOREIGN BODY REMOVAL ABDOMINAL N/A 03/09/2021   Procedure: EXCISION OF FOREIGN BODY;  Surgeon: Ralene Ok, MD;  Location: [place];  Service: General;  Laterality: N/A;   HERNIA REPAIR     HERNIA REPAIR  03/02/2019   EXPLORATORY LAPAROTOMY (N/A Abdomen)   INCISIONAL HERNIA REPAIR N/A 03/02/2019   Procedure: INCISIONAL HERNIA REPAIR , RECTORECTUS VS TAR HERNIA REPAIR;  Surgeon: Ralene Ok, MD;  Location: [facility];  Service: General;  Laterality: N/A;   INSERTION OF MESH N/A 03/02/2019   Procedure: Insertion Of Mesh;  Surgeon: Ralene Ok, MD;  Location: [place];  Service: General;  Laterality: N/A;   LAPAROTOMY N/A 03/02/2019   Procedure: EXPLORATORY LAPAROTOMY;  Surgeon: Ralene Ok, MD;  Location: [place];  Service: General;  Laterality: N/A;   LAPAROTOMY N/A 03/09/2021   Procedure: EXPLORATION OF ABDOMINAL WALL;  Surgeon: Ralene Ok, MD;  Location: [place];  Service: General;  Laterality: N/A;   LYSIS OF ADHESION N/A 06/12/2018   Procedure: LYSIS OF ADHESIONS;  Surgeon: [name], MD;  Location: [facility];  Service: General;  Laterality: N/A;   LYSIS OF ADHESION N/A 03/02/2019   Procedure: Lysis Of Adhesion;  Surgeon: Ralene Ok, MD;  Location: [place];  Service: General;  Laterality: N/A;   PORTACATH PLACEMENT Right 03/07/2018   Procedure: INSERTION PORT-A-CATH RIGHT SUBCLAVIAN;  Surgeon: [name], MD;  Location: [facility];  Service: General;  Laterality: Right;   PROCTOSCOPY N/A 06/12/2018   Procedure: RIGID PROCTOSCOPY;  Surgeon: [name], MD;  Location: [facility];  Service: General;  Laterality: N/A;   thumb surgery   2018   cyst removal    I have reviewed the social history and family history with the patient  and they are unchanged from previous note.  ALLERGIES:  has No Known Allergies.  MEDICATIONS:  Current Outpatient Medications  Medication Sig Dispense Refill   ALPRAZolam (XANAX) 0.25 MG tablet TAKE 1 TABLET(0.25 MG) BY MOUTH AT BEDTIME AS NEEDED FOR ANXIETY 30 tablet 0   apixaban (ELIQUIS) 5 MG TABS tablet TAKE 1 TABLET(5 MG) BY MOUTH TWICE DAILY 60 tablet 5   b complex vitamins tablet Take 1 tablet by mouth daily.     dexamethasone (DECADRON) 4 MG tablet Take 2 tablets (8 mg total) by mouth daily. Start the day after chemotherapy for 2 days. Take with food. 8 tablet 5   diltiazem (CARDIZEM CD) 360 MG 24 hr capsule Take 1 capsule (360 mg total) by mouth daily. 90 capsule 0   diphenoxylate-atropine (LOMOTIL) 2.5-0.025 MG tablet Take 1-2 tablets by mouth 4 (four) times daily as needed for diarrhea or loose stools. 60 tablet 1   eszopiclone (LUNESTA) 1 MG TABS tablet Take 1 tablet (1 mg total) by mouth at bedtime as needed for sleep. Take immediately before bedtime 30 tablet 2   finasteride (PROSCAR) 5 MG tablet Take 5 mg by mouth daily.     levothyroxine (SYNTHROID) 75 MCG tablet TAKE 1 TABLET(75 MCG) BY MOUTH DAILY 90 tablet 1  lidocaine-prilocaine (EMLA) cream Apply 1 application topically as needed. 30 g 1   lidocaine-prilocaine (EMLA) cream Apply to affected area once 30 g 3   Multiple Vitamins-Iron (MULTIVITAMIN/IRON PO) Take 1 tablet by mouth daily.      ondansetron (ZOFRAN) 8 MG tablet Take 1 tablet (8 mg total) by mouth every 8 (eight) hours as needed for nausea, vomiting or refractory nausea / vomiting. Start on the third day after chemotherapy. 30 tablet 1   Oxycodone HCl 10 MG TABS Take 1 tablet (10 mg total) by mouth every 6 (six) hours as needed. 90 tablet 0   prochlorperazine (COMPAZINE) 10 MG tablet Take 1 tablet (10 mg total) by mouth every 6 (six) hours as needed for nausea or vomiting. 30 tablet 1   tamsulosin (FLOMAX) 0.4 MG CAPS capsule Take 0.4 mg by mouth 2 (two) times  daily.      No current facility-administered medications for this visit.   Facility-Administered Medications Ordered in Other Visits  Medication Dose Route Frequency Provider Last Rate Last Admin   0.9 %  sodium chloride infusion   Intravenous Once Alla Feeling, NP 500 mL/hr at 06/09/22 0757 New Bag at 06/09/22 0757    PHYSICAL EXAMINATION: ECOG PERFORMANCE STATUS: 3 - Symptomatic, >50% confined to bed  There were no vitals filed for this visit. Wt Readings from Last 3 Encounters:  06/09/22 201 lb 8 oz (91.4 kg)  06/02/22 212 lb 9 oz (96.4 kg)  05/13/22 214 lb 3.2 oz (97.2 kg)    GENERAL:alert, no distress and comfortable SKIN: skin color, texture, turgor are normal, no rashes or significant lesions EYES: normal, Conjunctiva are pink and non-injected, sclera clear  NECK: supple, thyroid normal size, non-tender, without nodularity LYMPH: (-)  no palpable lymphadenopathy in the cervical, axillary  LUNGS: clear to auscultation and percussion with normal breathing effort HEART: (-) regular rate & rhythm and no murmurs and no lower extremity edema ABDOMEN:(-) abdomen soft, non-tender and normal bowel sounds Musculoskeletal:no cyanosis of digits and no clubbing  NEURO: alert & oriented x 3 with fluent speech, no focal motor/sensory deficits  LABORATORY DATA:  I have reviewed the data as listed    Latest Ref Rng & Units 06/09/2022    7:31 AM 06/02/2022    9:58 AM 05/13/2022    8:59 AM  CBC  WBC 4.0 - 10.5 K/uL 7.4  4.9  6.9   Hemoglobin 13.0 - 17.0 g/dL 15.3  13.6  14.2   Hematocrit 39.0 - 52.0 % 44.8  40.8  42.5   Platelets 150 - 400 K/uL 221  204  218         Latest Ref Rng & Units 06/09/2022    7:31 AM 06/02/2022    9:58 AM 05/13/2022    8:59 AM  CMP  Glucose 70 - 99 mg/dL 100  121  105   BUN 8 - 23 mg/dL _0 Creatinine 0.61 - 1.24 mg/dL 1.23  1.13  1.02   Sodium 135 - 145 mmol/L 137  142  142   Potassium 3.5 - 5.1 mmol/L 3.3  3.5  3.9   Chloride 98 - 111  mmol/L 104  104  107   CO2 22 - 32 mmol/L _1 Calcium 8.9 - 10.3 mg/dL 8.6  9.0  9.6   Total Protein 6.5 - 8.1 g/dL 6.2  6.8  6.9   Total Bilirubin 0.3 - 1.2 mg/dL 1.7  0.5  0.6   Alkaline Phos 38 - 126 U/L 131  110  114   AST 15 - 41 U/L 40  21  22   ALT 0 - 44 U/L 111  18  14       RADIOGRAPHIC STUDIES: I have personally reviewed the radiological images as listed and agreed with the findings in the report. No results found.    Orders Placed This Encounter  Procedures   GI pathogen panel by PCR, stool    Standing Status:   Future    Number of Occurrences:   1    Standing Expiration Date:   06/10/2023   Ambulatory Referral to [facility]    Referral Priority:   Urgent    Referral Type:   Consultation    Referral Reason:   Specialty Services Required    Number of Visits Requested:   1   All questions were answered. The patient knows to call the clinic with any problems, questions or concerns. No barriers to learning was detected. The total time spent in the appointment was 30 minutes.     [name], MD 06/09/2022   Felicity Coyer, CMA, am acting as scribe for [name], MD.   I have reviewed the above documentation for accuracy and completeness, and I agree with the above.

## 2022-06-09 NOTE — Progress Notes (Signed)
Pt here for IVF, upon assessment pt reporting severe diarrhea, 10-12 stools/day for many days, taking lomotil, unable to take imodium d/t severe cramping. Pt has lost ~12lbs in 7 days. Dr Burr Medico notified, MD to chairside to assess pt. MD adding on potassium during infusion today and GI panel. Pt unable to obtain sample while in infusion, discharged with stool kit and instructions. Pt aware and agrees, pt knows to call with any issues or questions.

## 2022-06-09 NOTE — Patient Instructions (Signed)
Dehydration, Adult Dehydration is a condition in which there is not enough water or other fluids in the body. This happens when a person loses more fluids than he or she takes in. Important organs, such as the kidneys, brain, and heart, cannot function without a proper amount of fluids. Any loss of fluids from the body can lead to dehydration. Dehydration can be mild, moderate, or severe. It should be treated right away to prevent it from becoming severe. What are the causes? Dehydration may be caused by: Conditions that cause loss of water or other fluids, such as diarrhea, vomiting, or sweating or urinating a lot. Not drinking enough fluids, especially when you are ill or doing activities that require a lot of energy. Other illnesses and conditions, such as fever or infection. Certain medicines, such as medicines that remove excess fluid from the body (diuretics). Lack of safe drinking water. Not being able to get enough water and food. What increases the risk? The following factors may make you more likely to develop this condition: Having a long-term (chronic) illness that has not been treated properly, such as diabetes, heart disease, or kidney disease. Being 65 years of age or older. Having a disability. Living in a place that is high in altitude, where thinner, drier air causes more fluid loss. Doing exercises that put stress on your body for a long time (endurance sports). What are the signs or symptoms? Symptoms of dehydration depend on how severe it is. Mild or moderate dehydration Thirst. Dry lips or dry mouth. Dizziness or light-headedness, especially when standing up from a seated position. Muscle cramps. Dark urine. Urine may be the color of tea. Less urine or tears produced than usual. Headache. Severe dehydration Changes in skin. Your skin may be cold and clammy, blotchy, or pale. Your skin also may not return to normal after being lightly pinched and released. Little or  no tears, urine, or sweat. Changes in vital signs, such as rapid breathing and low blood pressure. Your pulse may be weak or may be faster than 100 beats a minute when you are sitting still. Other changes, such as: Feeling very thirsty. Sunken eyes. Cold hands and feet. Confusion. Being very tired (lethargic) or having trouble waking from sleep. Short-term weight loss. Loss of consciousness. How is this diagnosed? This condition is diagnosed based on your symptoms and a physical exam. You may have blood and urine tests to help confirm the diagnosis. How is this treated? Treatment for this condition depends on how severe it is. Treatment should be started right away. Do not wait until dehydration becomes severe. Severe dehydration is an emergency and needs to be treated in a hospital. Mild or moderate dehydration can be treated at home. You may be asked to: Drink more fluids. Drink an oral rehydration solution (ORS). This drink helps restore proper amounts of fluids and salts and minerals in the blood (electrolytes). Severe dehydration can be treated: With IV fluids. By correcting abnormal levels of electrolytes. This is often done by giving electrolytes through a tube that is passed through your nose and into your stomach (nasogastric tube, or NG tube). By treating the underlying cause of dehydration. Follow these instructions at home: Oral rehydration solution If told by your health care provider, drink an ORS: Make an ORS by following instructions on the package. Start by drinking small amounts, about  cup (120 mL) every 5-10 minutes. Slowly increase how much you drink until you have taken the amount recommended by your health   care provider. Eating and drinking        Drink enough clear fluid to keep your urine pale yellow. If you were told to drink an ORS, finish the ORS first and then start slowly drinking other clear fluids. Drink fluids such as: Water. Do not drink only  water. Doing that can lead to hyponatremia, which is having too little salt (sodium) in the body. Water from ice chips you suck on. Fruit juice that you have added water to (diluted fruit juice). Low-calorie sports drinks. Eat foods that contain a healthy balance of electrolytes, such as bananas, oranges, potatoes, tomatoes, and spinach. Do not drink alcohol. Avoid the following: Drinks that contain a lot of sugar. These include high-calorie sports drinks, fruit juice that is not diluted, and soda. Caffeine. Foods that are greasy or contain a lot of fat or sugar. General instructions Take over-the-counter and prescription medicines only as told by your health care provider. Do not take sodium tablets. Doing that can lead to having too much sodium in the body (hypernatremia). Return to your normal activities as told by your health care provider. Ask your health care provider what activities are safe for you. Keep all follow-up visits as told by your health care provider. This is important. Contact a health care provider if: You have muscle cramps, pain, or discomfort, such as: Pain in your abdomen and the pain gets worse or stays in one area (localizes). Stiff neck. You have a rash. You are more irritable than usual. You are sleepier or have a harder time waking than usual. You feel weak or dizzy. You feel very thirsty. Get help right away if you have: Any symptoms of severe dehydration. Symptoms of vomiting, such as: You cannot eat or drink without vomiting. Vomiting gets worse or does not go away. Vomit includes blood or green matter (bile). Symptoms that get worse with treatment. A fever. A severe headache. Problems with urination or bowel movements, such as: Diarrhea that gets worse or does not go away. Blood in your stool (feces). This may cause stool to look black and tarry. Not urinating, or urinating only a small amount of very dark urine, within 6-8 hours. Trouble  breathing. These symptoms may represent a serious problem that is an emergency. Do not wait to see if the symptoms will go away. Get medical help right away. Call your local emergency services (911 in the [place] Do not drive yourself to the hospital. Summary Dehydration is a condition in which there is not enough water or other fluids in the body. This happens when a person loses more fluids than he or she takes in. Treatment for this condition depends on how severe it is. Treatment should be started right away. Do not wait until dehydration becomes severe. Drink enough clear fluid to keep your urine pale yellow. If you were told to drink an oral rehydration solution (ORS), finish the ORS first and then start slowly drinking other clear fluids. Take over-the-counter and prescription medicines only as told by your health care provider. Get help right away if you have any symptoms of severe dehydration. This information is not intended to replace advice given to you by your health care provider. Make sure you discuss any questions you have with your health care provider. Document Revised: 09/30/2021 Document Reviewed: 01/04/2019 [employer] Patient Education  2023 [employer]  

## 2022-06-10 ENCOUNTER — Inpatient Hospital Stay: Payer: BC Managed Care – PPO | Admitting: Hematology

## 2022-06-10 ENCOUNTER — Inpatient Hospital Stay: Payer: BC Managed Care – PPO

## 2022-06-10 LAB — GASTROINTESTINAL PANEL BY PCR, STOOL (REPLACES STOOL CULTURE)

## 2022-06-11 ENCOUNTER — Encounter: Payer: Self-pay | Admitting: Hematology

## 2022-06-11 ENCOUNTER — Other Ambulatory Visit: Payer: Self-pay

## 2022-06-11 DIAGNOSIS — E860 Dehydration: Secondary | ICD-10-CM

## 2022-06-11 DIAGNOSIS — R112 Nausea with vomiting, unspecified: Secondary | ICD-10-CM

## 2022-06-11 HISTORY — DX: Nausea with vomiting, unspecified: R11.2

## 2022-06-12 ENCOUNTER — Inpatient Hospital Stay: Payer: BC Managed Care – PPO

## 2022-06-12 ENCOUNTER — Other Ambulatory Visit: Payer: Self-pay

## 2022-06-12 VITALS — BP 117/79 | HR 84 | Temp 98.0°F | Resp 18

## 2022-06-12 DIAGNOSIS — E860 Dehydration: Secondary | ICD-10-CM

## 2022-06-12 DIAGNOSIS — C186 Malignant neoplasm of descending colon: Secondary | ICD-10-CM

## 2022-06-12 DIAGNOSIS — Z452 Encounter for adjustment and management of vascular access device: Secondary | ICD-10-CM | POA: Diagnosis not present

## 2022-06-12 DIAGNOSIS — C787 Secondary malignant neoplasm of liver and intrahepatic bile duct: Secondary | ICD-10-CM | POA: Diagnosis not present

## 2022-06-12 DIAGNOSIS — G893 Neoplasm related pain (acute) (chronic): Secondary | ICD-10-CM | POA: Diagnosis not present

## 2022-06-12 DIAGNOSIS — Z5111 Encounter for antineoplastic chemotherapy: Secondary | ICD-10-CM | POA: Diagnosis not present

## 2022-06-12 DIAGNOSIS — K5730 Diverticulosis of large intestine without perforation or abscess without bleeding: Secondary | ICD-10-CM | POA: Diagnosis not present

## 2022-06-12 DIAGNOSIS — C786 Secondary malignant neoplasm of retroperitoneum and peritoneum: Secondary | ICD-10-CM | POA: Diagnosis not present

## 2022-06-12 DIAGNOSIS — Z5112 Encounter for antineoplastic immunotherapy: Secondary | ICD-10-CM | POA: Diagnosis not present

## 2022-06-12 DIAGNOSIS — R112 Nausea with vomiting, unspecified: Secondary | ICD-10-CM

## 2022-06-12 DIAGNOSIS — Z79899 Other long term (current) drug therapy: Secondary | ICD-10-CM | POA: Diagnosis not present

## 2022-06-12 DIAGNOSIS — Z95828 Presence of other vascular implants and grafts: Secondary | ICD-10-CM

## 2022-06-12 MED ORDER — SODIUM CHLORIDE 0.9 % IV SOLN: Freq: Once | INTRAVENOUS | Status: AC

## 2022-06-12 MED ORDER — ONDANSETRON HCL 4 MG/2ML IJ SOLN
8.0000 mg | Freq: Once | INTRAMUSCULAR | Status: AC
  Administered 2022-06-12: 8 mg via INTRAVENOUS
  Filled 2022-06-12: qty 4

## 2022-06-12 MED ORDER — HEPARIN SOD (PORK) LOCK FLUSH 100 UNIT/ML IV SOLN
500.0000 [IU] | Freq: Once | INTRAVENOUS | Status: AC | PRN
  Administered 2022-06-12: 500 [IU]

## 2022-06-12 MED ORDER — SODIUM CHLORIDE 0.9 % IV SOLN: Freq: Once | INTRAVENOUS | Status: DC

## 2022-06-12 MED ORDER — SODIUM CHLORIDE 0.9% FLUSH
10.0000 mL | INTRAVENOUS | Status: DC | PRN
  Administered 2022-06-12: 10 mL

## 2022-06-15 ENCOUNTER — Encounter: Payer: Self-pay | Admitting: Hematology

## 2022-06-15 NOTE — Telephone Encounter (Signed)
Open by mistake

## 2022-06-16 MED FILL — Dexamethasone Sodium Phosphate Inj 100 MG/10ML: INTRAMUSCULAR | Qty: 1 | Status: AC

## 2022-06-16 NOTE — Progress Notes (Unsigned)
[place]   [phone] [phone]   Clinic Follow up Note   Patient Care Team: [name], MD as PCP - General (Hematology) [name], MD as Consulting Physician (Cardiology) [name], MD as Consulting Physician (Gastroenterology) [name], MD as Consulting Physician (General Surgery) [name], MD as Consulting Physician (General Surgery) [name], MD as Consulting Physician (Urology) [name], MD as Consulting Physician (Medical Oncology)  Date of Service:  06/17/2022  CHIEF COMPLAINT: f/u of  metastatic colon cancer     CURRENT THERAPY:   FOLFIRI/bevacizumab every 2 weeks, starting 05/13/2022      ASSESSMENT:  Dave Vaughn is a 77 y.o. male with   Goals of care, counseling/discussion -We again discussed the incurable nature of his cancer, and the overall poor prognosis, especially if he does not have good response to chemotherapy or progress on chemo -We discussed the average survival in metastatic colon cancer patients, after they progress on first few line chemotherapy.  Patient was quite overwhelmed and shocked when I told him his life expectancy is cannot be several months to a year, emotional support provided.  I will refer him to psychologist Dr. Michail Sermon, he agrees. -The patient understands the goal of care is palliative. 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 S/p surgery and adjuvant FOLFOX 6 cycles . He did not tolerate well and declined additional treatment at that time. -restaging in 12/2019 revealed local recurrence in left abdominal wall and peritoneal metastasis, 2 hypermetabolic liver mets. Abdominal wall biopsy  confirmed metastatic colorectal adenocarcinoma in 01/2020  -FO showed KRAS mutation (+), MSS -s/p CAPOX+beva, xeloda+beva and he had multiple abdominal surgery  -due to recent cancer progression, plan to change to FOLFIRI and beva -Due to severe diarrhea, irinotecan dosage was reduced for cycle 2, but he developed worsening diarrhea. -We discussed reduce irinotecan dose further, versus holding irinotecan for cycle 3 due to his travel plan after cycle 3.  After discussion, we decided to hold irinotecan today given his residual diarrhea. -Due to his travel plan, he wants to postpone his next treatment to July 14, 2022, which has been scheduled. -I discussed the balance of work and life, and encouraged him to cut back his working hours, and more focused on his own house and spend time with his family.     PLAN: -lab reviewed -Discuss recent stool testing -negative. -Discuss w/pt living will. He agrees with DNR -I encourage the pt to cut his work hours and travel  -Hold Irinotecan today due to pt traveling in next 3-4 weeks, will restart on 07/14/2022 -proceed with LV/5FU/bevacizumab  -referral to Dr. Lorra Hals) -lab,f/u and FOLFIRI+ BEVA 07/14/2022  SUMMARY OF ONCOLOGIC HISTORY: Oncology History Overview Note   Cancer Staging  Cancer of left colon [facility] [place]) Staging form: Colon and Rectum, AJCC 8th Edition - Pathologic stage from 01/11/2018: Stage IIIB (pT3, pN1c, cM0) - Signed by [name], MD on 01/16/2018 Total positive nodes: 0 Histologic grading system: 4 grade system Histologic grade (G): G2     Cancer of left colon ([place])  01/10/2018 Imaging   CT AP W Contrast 01/10/18  IMPRESSION: Irregular soft tissue density causing stricture of the mid descending colon likely the  site of obstruction for the dilated small bowel. 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 No evidence of perforation.   Equivocal findings involving the appendix measuring 1.2 cm at the appendiceal tip with mucosal  enhancement. No adjacent free fluid or inflammatory change. Findings are nonspecific, but can be seen with early acute appendicitis.   Mild prostatic enlargement. Increased density over the posterior bladder base likely due to the large prostatic impression although cannot completely exclude a bladder mass. Urology protocol CT or ultrasound may be helpful for better evaluation.   Mild cholelithiasis.   Stable 1.5 cm cystic structure over the lower pole right kidney likely slightly hyperdense cyst.   Diverticulosis of the colon.   Aortic Atherosclerosis (ICD10-I70.0).   01/11/2018 Cancer Staging   Staging form: Colon and Rectum, AJCC 8th Edition - Pathologic stage from 01/11/2018: Stage IIIB (pT3, pN1c, cM0) - Signed by [name], MD on 01/16/2018   01/11/2018 Surgery   LEFT COLON RESECTION, TAKEDOWN SPLENIC FLEXURE, COLOSTOMY by Dr. [name]    01/11/2018 Procedure   Colonoscopy 01/11/18 by Dr. [name]  - Malignant completely obstructing tumor in the mid descending colon. Tattooed. - Diverticulosis in the sigmoid colon. - Non-bleeding internal hemorrhoids. - No specimens collected.   01/11/2018 Pathology Results   Diagnosis 01/11/18  1. Colon, segmental resection for tumor, descending colon - INVASIVE COLORECTAL ADENOCARCINOMA, 4 CM. - TUMOR EXTENDS INTO PERICOLONIC CONNECTIVE TISSUE. - TUMOR FOCALLY INVOLVES RADIAL MARGIN. - ONE MESENTERIC TUMOR DEPOSIT. - THIRTEEN BENIGN LYMPH NODES (0/13). 2. Colon, segmental resection, splenic flexure - BENIGN COLON. - NO EVIDENCE OF MALIGNANCY .   01/11/2018 Tumor Marker   Baseline CEA at 3.4   01/16/2018 Initial Diagnosis   Cancer of left colon ([place])   01/23/2018 Imaging   CT CHEST WO CONTRAST IMPRESSION: 1. No evidence for metastatic disease within the chest. 2. Small left pleural effusion with underlying opacities which may represent atelectasis. Right basilar atelectasis. 3. Few foci of gas within the upper abdomen in the omentum  with surrounding fat stranding, likely postsurgical 4. Aortic Atherosclerosis (ICD10-I70.0).   03/08/2018 - 05/15/2018 Chemotherapy   adjuvant FOLOFX. Due to side effects of neuropahty Oxaliplatin was stopped after 3 cycles and chemo was stopped after 6 cycles. He declined completing 6 months of chemo treatment.     03/19/2018 Imaging   03/19/2018 CT AP IMPRESSION: 1. Interval partial left hemicolectomy and descending colostomy. 2. Heterogeneous soft tissue density along the left anterior renal fascia is most likely postoperative (favor fat necrosis). No well-defined fluid collection. 3. Mild left lower quadrant edema, new since 01/10/2018. This could be postoperative. Superimposed sigmoid diverticulitis and/or cystitis cannot be excluded. 4. Subtle hyperenhancing nodule within the anterior bladder dome cannot be excluded. Consider nonemergent cystoscopy. When this is performed, recommend attention to the left ureterovesicular junction and distal left ureter to evaluate questionable soft tissue fullness. 5. Cholelithiasis. 6.  Aortic Atherosclerosis (ICD10-I70.0). 7. Prostatomegaly.   11/13/2018 Imaging   CT CAP WO Contrast 11/13/18  IMPRESSION: 1. Reversal of left lower quadrant colostomy with sigmoid colon anastomosis. No complicating features. No findings for residual or recurrent tumor or metastatic disease involving the chest, abdomen or pelvis without contrast. 2. No acute abdominal/pelvic findings. 3. Gallbladder sludge and gallstones but no findings for acute cholecystitis. 4. Stable anterior abdominal wall hernia. 5. The right testicle is in the right inguinal canal.   10/03/2019 Imaging   CT CAP WO contrast  IMPRESSION: 1. New rounded density interposed between the prostate gland and anterior upper rectal  wall could represent adenopathy or local extension of anterior rectal tumor. 2. Marked prostatomegaly, prostate volume 150 cubic cm. 3. Other imaging findings of  potential clinical significance: Aortic Atherosclerosis (ICD10-I70.0). Coronary atherosclerosis. Trace right pleural effusion. Suspected cholelithiasis. Nonobstructive left nephrolithiasis. Multilevel lumbar impingement. Bilateral mildly retracted testicles. Hypodense exophytic lesion of the right kidney, most likely to be a cyst.   11/02/2019 Procedure   colonoscopy on 11/02/2019 by Dr. [name] Plan showed normal digital rectal exam, 3 polyps in the rectum, descending colon and cecum, and a prior sigmoid: Anastomosis characterized by erythema.  He found an extrinsic nonobstructing medium-sized mass in the proximal rectum about 4 cm in length, no internal rectal mass.   Diagnosis Surgical [P], colon, cecum, descending, rectal, polyp (3) - TUBULAR ADENOMA (TWO) - NO HIGH GRADE DYSPLASIA OR CARCINOMA. - COLONIC FRAGMENT WITH BENIGN LYMPHOID AGGREGATE.   12/07/2019 Imaging   MRI pelvis IMPRESSION: 1. Masslike area in the rectum suspicious for rectal neoplasm, likely T4b based on the appearance of soft tissue extending along the anterior peritoneal reflection and into the seminal vesicles. Correlation with recent colonoscopy results may be helpful. Area of anastomosis and other areas of the pelvis are not imaged on today's exam.   12/21/2019 PET scan   IMPRESSION: 1. Unfortunately evidence for peritoneal metastasis. Intensely hypermetabolic nodules along the LEFT pericolic gutter. Favor hypermetabolic mass anterior to the rectum to represent serosal implant along the ventral surface of the rectum. 2. local recurrence within the LEFT abdominal wall at site prior colostomy. Intense hypermetabolic thickening of the rectus muscle at this site. 3. Two hypermetabolic hepatic metastasis.   01/15/2020 Relapse/Recurrence   FINAL MICROSCOPIC DIAGNOSIS:   A. SOFT TISSUE, LEFT ABDOMINAL WALL, BIOPSY:  - Adenocarcinoma.  - See comment.   COMMENT:   The morphology is consistent with metastatic  colorectal adenocarcinoma.    01/28/2020 -  Chemotherapy   First line FOLFIRI q2weeks starting 01/28/20 for 8 cycles.  -----Bevacizumab added with C2.  -----Changed to maintenance Xeloda 2000 mg twice daily for 2 weeks on/1 week off and bevacizumab 06/02/20. Starting with C3, dose reduce to '1500mg'$  BID due to skin toxicity.   05/05/2020 Imaging   IMPRESSION: 1. Improved appearance, with reduced size of the hepatic metastatic lesions and reduced size of the peritoneal tumor implants. 2. Other imaging findings of potential clinical significance: Notable prostatomegaly. Multilevel impingement in the lumbar spine. Dependent density in the gallbladder possibly from sludge or gallstones. Degenerative glenohumeral arthropathy bilaterally. 3. Aortic atherosclerosis.   08/15/2020 Imaging   CT CAP  IMPRESSION: Chest Impression:   No evidence of thoracic metastasis   Abdomen / Pelvis Impression:   1. Hepatic metastasis are no longer measurable by CT imaging. 2. Peritoneal nodular metastasis in the LEFT abdomen are decreased in size. 3. No evidence of new peritoneal disease. 4. Thickening and LEFT rectus muscle at site of prior metastasis. No interval change. 5. No evidence of new or progressive colorectal carcinoma.     11/27/2020 Imaging   CT CAP  IMPRESSION: 1. There are multiple small pulmonary nodules in the right upper lobe that are new or enlarged compared to prior examination but measuring 4 mm or smaller, suspicious for pulmonary metastatic disease. 2. Unchanged peritoneal nodule adjacent to the tip of the spleen measuring 1.3 x 1.2 cm. Slightly peritoneal nodule adjacent to the splenic flexure measuring no greater than 6 mm, previously 8 mm. 3. No significant change in previously hypermetabolic soft tissue mass centered about the left lower quadrant colostomy site.  4. Previously established hypermetabolic hepatic metastatic disease remains inapparent by CT. 5. Status post  sigmoid colon resection and reanastomosis. 6. Prostatomegaly with thickening of the decompressed urinary bladder, likely secondary to chronic outlet obstruction. 7. Cholelithiasis.   Aortic Atherosclerosis (ICD10-I70.0).   01/29/2021 Imaging   CT CAP   IMPRESSION: 1. Minimal interval progression of bilateral pulmonary nodules, concerning for metastatic disease. 2. No substantial change in size of the peritoneal nodule at the inferior tip of the spleen and splenic flexure nodule. 3. Nodular soft tissue measured at the ostomy site previously is not evident today. 4. Marked prostatomegaly. 5. Cholelithiasis. 6. Aortic Atherosclerosis (ICD10-I70.0).   04/06/2021 Imaging   EXAM: CT ABDOMEN AND PELVIS WITHOUT CONTRAST (Renal Stone Protocol)  IMPRESSION: 1. Bladder wall thickening with surrounding inflammation concerning for cystitis. 2. Partial colectomy. Colon is air-filled with relative transition just proximal to the anastomosis. Stricture or recurrence at this level would be difficult to exclude. No focal mass identified. 3. There is a new subcutaneous fluid collection at the level of prior ostomy in the left abdominal wall. Infection not excluded. 4. There is new intramuscular hyperdensity at the level of the prior ostomy, indeterminate. Findings may related to scarring, tumor recurrence or metastatic disease is not excluded. 5. Stable nodularity in the left upper quadrant. 6. Cholelithiasis. 7. Trace right pleural effusion. 8. Stable prostatomegaly. 9.  Aortic Atherosclerosis (ICD10-I70.0).   05/05/2021 Pathology Results   FINAL MICROSCOPIC DIAGNOSIS:   A. ABDOMINAL WALL SCAR, EXCISION:  -  Adenocarcinoma  -  See comment   COMMENT:  Morphologically consistent with colonic adenocarcinoma (similar to  previously reported ([id number]).   06/02/2021 Imaging   EXAM: CT CHEST, ABDOMEN, AND PELVIS WITH CONTRAST  IMPRESSION: 1. Progressive multifocal pulmonary  metastatic disease. 2. New low-density hepatic lesions, likely metastases. 3. Progressive peritoneal implant near the splenic flexure of the colon. Small peritoneal implant inferior to the spleen appears unchanged. 4. Increased soft tissue nodularity near the midline incision in the upper anterior abdominal wall, suspicious for tumor recurrence at the incision. This could reflect keloid formation. 5. Fluid collection in the left anterior abdominal wall attributed to recent abdominal surgery with associated small incisional hernia containing fat. 6. Additional incidental findings including prostatomegaly, cholelithiasis and Aortic Atherosclerosis (ICD10-I70.0).   06/11/2021 - 11/26/2021 Chemotherapy   Patient is on Treatment Plan : COLORECTALXeloda + Bevacizumab q21d     07/29/2021 Imaging   EXAMINATION: CT ABDOMEN PELVIS WO CONTRAST  Impression  1. Left anterior abdominal wall postsurgical changes with no recurrent hernia.  2. Stable indeterminate 3 cm ventral abdominal wall mass located at the L1-2 level. Question scarring from prior surgery versus neoplasm.  3. Cholelithiasis.  4. Stable indeterminate bibasilar lung nodules. All of with a CT chest is recommended.  5. Indeterminate stable right hepatic lesions. Consider follow-up with an abdominal MRI with and without contrast for additional characterization.     12/17/2021 - 02/01/2022 Chemotherapy   Patient is on Treatment Plan : COLORECTAL CapeOx + Bevacizumab q21d     12/17/2021 - 03/17/2022 Chemotherapy   Patient is on Treatment Plan : COLORECTAL CapeOx + Bevacizumab q21d     05/13/2022 -  Chemotherapy   Patient is on Treatment Plan : COLORECTAL FOLFIRI + Bevacizumab q14d        INTERVAL HISTORY:  Dave Vaughn is here for a follow up of  metastatic colon cancer   He was last seen by me on 06/09/22 He presents to the clinic accompanied by wife .  Pt reports that h diarrhea is better but still there. He goes to the restroom 3-4  times a day. He states the lomotil. Pt states his pain in his side is minimum.      All other systems were reviewed with the patient and are negative.  MEDICAL HISTORY:  Past Medical History:  Diagnosis Date   Adenocarcinoma, colon ([place]) dx'd 01/2018   Anemia    taking iron supplements   Anxiety    Atrial fibrillation with RVR ([facility])    Colonic obstruction ([place]) 01/10/2018   Depression    Diverticulitis    Dysrhythmia    afib   History of kidney stones    Hypertension    Indwelling Foley catheter present    due to UTI   Nausea with vomiting 06/11/2022    SURGICAL HISTORY: Past Surgical History:  Procedure Laterality Date   ANKLE SURGERY Left    when he was in college   COLON RESECTION N/A 01/11/2018   Procedure: LEFT COLON RESECTION, TAKEDOWN SPLENIC FLEXURE, COLOSTOMY;  Surgeon: [name], MD;  Location: [facility];  Service: General;  Laterality: N/A;   COLONOSCOPY  01/11/2018   Procedure: COLONOSCOPY;  Surgeon: Jackquline Denmark, MD;  Location: [facility];  Service: Endoscopy;;   COLONOSCOPY  05/10/2018   colonscopy  05/10/2018   EXCISION OF KELOID N/A 05/05/2021   Procedure: ABDOMINAL SCAR EXCISION;  Surgeon: Ralene Ok, MD;  Location: [facility];  Service: General;  Laterality: N/A;   FOREIGN BODY REMOVAL ABDOMINAL N/A 03/09/2021   Procedure: EXCISION OF FOREIGN BODY;  Surgeon: Ralene Ok, MD;  Location: [facility];  Service: General;  Laterality: N/A;   HERNIA REPAIR     HERNIA REPAIR  03/02/2019   EXPLORATORY LAPAROTOMY (N/A Abdomen)   INCISIONAL HERNIA REPAIR N/A 03/02/2019   Procedure: INCISIONAL HERNIA REPAIR , RECTORECTUS VS TAR HERNIA REPAIR;  Surgeon: Ralene Ok, MD;  Location: [place] [facility];  Service: General;  Laterality: N/A;   INSERTION OF MESH N/A 03/02/2019   Procedure: Insertion Of Mesh;  Surgeon: Ralene Ok, MD;  Location: [facility];  Service: General;  Laterality: N/A;   LAPAROTOMY N/A 03/02/2019   Procedure: EXPLORATORY LAPAROTOMY;  Surgeon: Ralene Ok,  MD;  Location: [facility];  Service: General;  Laterality: N/A;   LAPAROTOMY N/A 03/09/2021   Procedure: EXPLORATION OF ABDOMINAL WALL;  Surgeon: Ralene Ok, MD;  Location: [facility];  Service: General;  Laterality: N/A;   LYSIS OF ADHESION N/A 06/12/2018   Procedure: LYSIS OF ADHESIONS;  Surgeon: [name], MD;  Location: [facility];  Service: General;  Laterality: N/A;   LYSIS OF ADHESION N/A 03/02/2019   Procedure: Lysis Of Adhesion;  Surgeon: Ralene Ok, MD;  Location: [facility] [place];  Service: General;  Laterality: N/A;   PORTACATH PLACEMENT Right 03/07/2018   Procedure: INSERTION PORT-A-CATH RIGHT SUBCLAVIAN;  Surgeon: [name], MD;  Location: [facility];  Service: General;  Laterality: Right;   PROCTOSCOPY N/A 06/12/2018   Procedure: RIGID PROCTOSCOPY;  Surgeon: [name], MD;  Location: [facility];  Service: General;  Laterality: N/A;   thumb surgery   2018   cyst removal    I have reviewed the social history and family history with the patient and they are unchanged from previous note.  ALLERGIES:  has No Known Allergies.  MEDICATIONS:  Current Outpatient Medications  Medication Sig Dispense Refill   ALPRAZolam (XANAX) 0.25 MG tablet TAKE 1 TABLET(0.25 MG) BY MOUTH AT BEDTIME AS NEEDED FOR ANXIETY 30 tablet 0   apixaban (  ELIQUIS) 5 MG TABS tablet TAKE 1 TABLET(5 MG) BY MOUTH TWICE DAILY 60 tablet 5   b complex vitamins tablet Take 1 tablet by mouth daily.     dexamethasone (DECADRON) 4 MG tablet Take 2 tablets (8 mg total) by mouth daily. Start the day after chemotherapy for 2 days. Take with food. 8 tablet 5   diltiazem (CARDIZEM CD) 360 MG 24 hr capsule Take 1 capsule (360 mg total) by mouth daily. 90 capsule 0   diphenoxylate-atropine (LOMOTIL) 2.5-0.025 MG tablet Take 1-2 tablets by mouth 4 (four) times daily as needed for diarrhea or loose stools. 60 tablet 1   eszopiclone (LUNESTA) 1 MG TABS tablet Take 1 tablet (1 mg total) by mouth at bedtime as needed for sleep. Take immediately  before bedtime 30 tablet 2   finasteride (PROSCAR) 5 MG tablet Take 5 mg by mouth daily.     levothyroxine (SYNTHROID) 75 MCG tablet TAKE 1 TABLET(75 MCG) BY MOUTH DAILY 90 tablet 1   lidocaine-prilocaine (EMLA) cream Apply 1 application topically as needed. 30 g 1   lidocaine-prilocaine (EMLA) cream Apply to affected area once 30 g 3   Multiple Vitamins-Iron (MULTIVITAMIN/IRON PO) Take 1 tablet by mouth daily.      ondansetron (ZOFRAN) 8 MG tablet Take 1 tablet (8 mg total) by mouth every 8 (eight) hours as needed for nausea, vomiting or refractory nausea / vomiting. Start on the third day after chemotherapy. 30 tablet 1   Oxycodone HCl 10 MG TABS Take 1 tablet (10 mg total) by mouth every 6 (six) hours as needed. 60 tablet 0   prochlorperazine (COMPAZINE) 10 MG tablet Take 1 tablet (10 mg total) by mouth every 6 (six) hours as needed for nausea or vomiting. 30 tablet 1   tamsulosin (FLOMAX) 0.4 MG CAPS capsule Take 0.4 mg by mouth 2 (two) times daily.      No current facility-administered medications for this visit.    PHYSICAL EXAMINATION: ECOG PERFORMANCE STATUS: 1 - Symptomatic but completely ambulatory  Vitals:   06/17/22 0910  BP: 127/79  Pulse: (!) 59  Resp: 18  Temp: 98.9 F (37.2 C)  SpO2: 98%   Wt Readings from Last 3 Encounters:  06/17/22 209 lb 1.6 oz (94.8 kg)  06/09/22 201 lb 8 oz (91.4 kg)  06/02/22 212 lb 9 oz (96.4 kg)     GENERAL:alert, no distress and comfortable SKIN: skin color normal, no rashes or significant lesions EYES: normal, Conjunctiva are pink and non-injected, sclera clear  NEURO: alert & oriented x 3 with fluent speech  LABORATORY DATA:  I have reviewed the data as listed    Latest Ref Rng & Units 06/17/2022    8:36 AM 06/09/2022    7:31 AM 06/02/2022    9:58 AM  CBC  WBC 4.0 - 10.5 K/uL 3.3  7.4  4.9   Hemoglobin 13.0 - 17.0 g/dL 12.7  15.3  13.6   Hematocrit 39.0 - 52.0 % 37.9  44.8  40.8   Platelets 150 - 400 K/uL 181  221  204          Latest Ref Rng & Units 06/17/2022    8:36 AM 06/09/2022    7:31 AM 06/02/2022    9:58 AM  CMP  Glucose 70 - 99 mg/dL 100  100  121   BUN 8 - 23 mg/dL '21  30  15   '$ Creatinine 0.61 - 1.24 mg/dL 0.97  1.23  1.13   Sodium 135 -  145 mmol/L 140  137  142   Potassium 3.5 - 5.1 mmol/L 3.9  3.3  3.5   Chloride 98 - 111 mmol/L 106  104  104   CO2 22 - 32 mmol/L '28  25  31   '$ Calcium 8.9 - 10.3 mg/dL 9.0  8.6  9.0   Total Protein 6.5 - 8.1 g/dL 6.2  6.2  6.8   Total Bilirubin 0.3 - 1.2 mg/dL 0.7  1.7  0.5   Alkaline Phos 38 - 126 U/L 110  131  110   AST 15 - 41 U/L 16  40  21   ALT 0 - 44 U/L 24  111  18       RADIOGRAPHIC STUDIES: I have personally reviewed the radiological images as listed and agreed with the findings in the report. No results found.    Orders Placed This Encounter  Procedures   CBC with Differential ([facility] Only)    Standing Status:   Future    Standing Expiration Date:   07/15/2023   CMP ([facility] only)    Standing Status:   Future    Standing Expiration Date:   07/15/2023   Ambulatory referral to Psychiatry    Referral Priority:   Routine    Referral Type:   Psychiatric    Referral Reason:   Specialty Services Required    Requested Specialty:   Psychiatry    Number of Visits Requested:   1   All questions were answered. The patient knows to call the clinic with any problems, questions or concerns. No barriers to learning was detected. The total time spent in the appointment was 40 minutes.     [name], MD 06/17/2022   Felicity Coyer, CMA, am acting as scribe for [name], MD.   I have reviewed the above documentation for accuracy and completeness, and I agree with the above.

## 2022-06-16 NOTE — Assessment & Plan Note (Signed)
-  he has abdominal pain from peritoneal mets -on oxycodone PRN

## 2022-06-16 NOTE — Assessment & Plan Note (Signed)
-  We again discussed the incurable nature of his cancer, and the overall poor prognosis, especially if he does not have good response to chemotherapy or progress on chemo -We discussed the average survival in metastatic colon cancer patients, after they progress on first few line chemotherapy.  Patient was quite overwhelmed and shocked when I told him his life expectancy is cannot be several months to a year, emotional support provided.  I will refer him to psychologist Dr. Bosie Clos, he agrees. -The patient understands the goal of care is palliative. -I recommend DNR/DNI, he has agreed today, he has a liver will

## 2022-06-17 ENCOUNTER — Inpatient Hospital Stay: Payer: BC Managed Care – PPO

## 2022-06-17 ENCOUNTER — Inpatient Hospital Stay (HOSPITAL_BASED_OUTPATIENT_CLINIC_OR_DEPARTMENT_OTHER): Payer: BC Managed Care – PPO | Admitting: Hematology

## 2022-06-17 ENCOUNTER — Encounter: Payer: Self-pay | Admitting: Hematology

## 2022-06-17 ENCOUNTER — Other Ambulatory Visit: Payer: Self-pay | Admitting: Nurse Practitioner

## 2022-06-17 ENCOUNTER — Inpatient Hospital Stay: Payer: BC Managed Care – PPO | Admitting: Hematology

## 2022-06-17 ENCOUNTER — Inpatient Hospital Stay: Payer: BC Managed Care – PPO | Admitting: Nutrition

## 2022-06-17 VITALS — BP 127/79 | HR 59 | Temp 98.9°F | Resp 18 | Ht 70.0 in | Wt 209.1 lb

## 2022-06-17 DIAGNOSIS — Z95828 Presence of other vascular implants and grafts: Secondary | ICD-10-CM

## 2022-06-17 DIAGNOSIS — G893 Neoplasm related pain (acute) (chronic): Secondary | ICD-10-CM

## 2022-06-17 DIAGNOSIS — C786 Secondary malignant neoplasm of retroperitoneum and peritoneum: Secondary | ICD-10-CM | POA: Diagnosis not present

## 2022-06-17 DIAGNOSIS — E860 Dehydration: Secondary | ICD-10-CM

## 2022-06-17 DIAGNOSIS — R112 Nausea with vomiting, unspecified: Secondary | ICD-10-CM

## 2022-06-17 DIAGNOSIS — C186 Malignant neoplasm of descending colon: Secondary | ICD-10-CM

## 2022-06-17 DIAGNOSIS — Z7189 Other specified counseling: Secondary | ICD-10-CM

## 2022-06-17 DIAGNOSIS — Z66 Do not resuscitate: Secondary | ICD-10-CM | POA: Diagnosis not present

## 2022-06-17 DIAGNOSIS — K5730 Diverticulosis of large intestine without perforation or abscess without bleeding: Secondary | ICD-10-CM | POA: Diagnosis not present

## 2022-06-17 DIAGNOSIS — Z79899 Other long term (current) drug therapy: Secondary | ICD-10-CM | POA: Diagnosis not present

## 2022-06-17 DIAGNOSIS — Z5112 Encounter for antineoplastic immunotherapy: Secondary | ICD-10-CM | POA: Diagnosis not present

## 2022-06-17 DIAGNOSIS — Z5111 Encounter for antineoplastic chemotherapy: Secondary | ICD-10-CM | POA: Diagnosis not present

## 2022-06-17 DIAGNOSIS — C787 Secondary malignant neoplasm of liver and intrahepatic bile duct: Secondary | ICD-10-CM | POA: Diagnosis not present

## 2022-06-17 DIAGNOSIS — Z452 Encounter for adjustment and management of vascular access device: Secondary | ICD-10-CM | POA: Diagnosis not present

## 2022-06-17 LAB — CMP (CANCER CENTER ONLY)
ALT: 24 U/L (ref 0–44)
AST: 16 U/L (ref 15–41)
Albumin: 3.6 g/dL (ref 3.5–5.0)
Alkaline Phosphatase: 110 U/L (ref 38–126)
Anion gap: 6 (ref 5–15)
BUN: 21 mg/dL (ref 8–23)
CO2: 28 mmol/L (ref 22–32)
Calcium: 9 mg/dL (ref 8.9–10.3)
Chloride: 106 mmol/L (ref 98–111)
Creatinine: 0.97 mg/dL (ref 0.61–1.24)
GFR, Estimated: >60 mL/min (ref >60)
Glucose, Bld: 100 mg/dL — ABNORMAL HIGH (ref 70–99)
Potassium: 3.9 mmol/L (ref 3.5–5.1)
Sodium: 140 mmol/L (ref 135–145)
Total Bilirubin: 0.7 mg/dL (ref 0.3–1.2)
Total Protein: 6.2 g/dL — ABNORMAL LOW (ref 6.5–8.1)

## 2022-06-17 LAB — CBC WITH DIFFERENTIAL (CANCER CENTER ONLY)
Abs Immature Granulocytes: 0.01 10*3/uL (ref 0.00–0.07)
Basophils Absolute: 0 10*3/uL (ref 0.0–0.1)
Basophils Relative: 1 %
Eosinophils Absolute: 0.1 10*3/uL (ref 0.0–0.5)
Eosinophils Relative: 4 %
HCT: 37.9 % — ABNORMAL LOW (ref 39.0–52.0)
Hemoglobin: 12.7 g/dL — ABNORMAL LOW (ref 13.0–17.0)
Immature Granulocytes: 0 %
Lymphocytes Relative: 31 %
Lymphs Abs: 1 10*3/uL (ref 0.7–4.0)
MCH: 30 pg (ref 26.0–34.0)
MCHC: 33.5 g/dL (ref 30.0–36.0)
MCV: 89.4 fL (ref 80.0–100.0)
Monocytes Absolute: 0.4 10*3/uL (ref 0.1–1.0)
Monocytes Relative: 13 %
Neutro Abs: 1.7 10*3/uL (ref 1.7–7.7)
Neutrophils Relative %: 51 %
Platelet Count: 181 10*3/uL (ref 150–400)
RBC: 4.24 MIL/uL (ref 4.22–5.81)
RDW: 15.2 % (ref 11.5–15.5)
WBC Count: 3.3 10*3/uL — ABNORMAL LOW (ref 4.0–10.5)
nRBC: 0 % (ref 0.0–0.2)

## 2022-06-17 LAB — TOTAL PROTEIN, URINE DIPSTICK: Protein, ur: 30 mg/dL — AB

## 2022-06-17 MED ORDER — SODIUM CHLORIDE 0.9 % IV SOLN
5.0000 mg/kg | Freq: Once | INTRAVENOUS | Status: AC
  Administered 2022-06-17: 500 mg via INTRAVENOUS
  Filled 2022-06-17: qty 16

## 2022-06-17 MED ORDER — PALONOSETRON HCL INJECTION 0.25 MG/5ML
0.2500 mg | Freq: Once | INTRAVENOUS | Status: AC
  Administered 2022-06-17: 0.25 mg via INTRAVENOUS
  Filled 2022-06-17: qty 5

## 2022-06-17 MED ORDER — OXYCODONE HCL 10 MG PO TABS: 10.0000 mg | ORAL_TABLET | Freq: Four times a day (QID) | ORAL | 0 refills | Status: DC | PRN

## 2022-06-17 MED ORDER — PROCHLORPERAZINE MALEATE 10 MG PO TABS: 10.0000 mg | ORAL_TABLET | Freq: Four times a day (QID) | ORAL | 1 refills | Status: DC | PRN

## 2022-06-17 MED ORDER — ONDANSETRON HCL 8 MG PO TABS: 8.0000 mg | ORAL_TABLET | Freq: Three times a day (TID) | ORAL | 1 refills | Status: DC | PRN

## 2022-06-17 MED ORDER — SODIUM CHLORIDE 0.9% FLUSH
10.0000 mL | INTRAVENOUS | Status: DC | PRN
  Administered 2022-06-17: 10 mL

## 2022-06-17 MED ORDER — SODIUM CHLORIDE 0.9 % IV SOLN
2232.0000 mg/m2 | INTRAVENOUS | Status: DC
  Administered 2022-06-17: 5000 mg via INTRAVENOUS
  Filled 2022-06-17: qty 100

## 2022-06-17 MED ORDER — SODIUM CHLORIDE 0.9 % IV SOLN
400.0000 mg/m2 | Freq: Once | INTRAVENOUS | Status: AC
  Administered 2022-06-17: 896 mg via INTRAVENOUS
  Filled 2022-06-17: qty 44.8

## 2022-06-17 MED ORDER — SODIUM CHLORIDE 0.9 % IV SOLN: Freq: Once | INTRAVENOUS | Status: AC

## 2022-06-17 MED ORDER — SODIUM CHLORIDE 0.9 % IV SOLN
10.0000 mg | Freq: Once | INTRAVENOUS | Status: AC
  Administered 2022-06-17: 10 mg via INTRAVENOUS
  Filled 2022-06-17: qty 10

## 2022-06-17 NOTE — Progress Notes (Signed)
77 year old male diagnosed with metastatic colon cancer followed by Dr. Burr Medico.  Patient is receiving FOLFIRI/bevacizumab every 2 weeks, starting December 7.  Past medical history includes anemia, anxiety, atrial fibs, depression, hypertension, indwelling Foley catheter.  Medications include Xanax, B complex, Decadron, Lomotil, Synthroid, multivitamin, Zofran, and Compazine.  Labs include glucose 100 on January 11.  Height: 5 feet 10 inches. Weight: 209 pounds 1.6 ounces on January 11. Usual body weight: 224 pounds in October 2023. BMI: 30.  Patient developed dehydration and diarrhea after Irinotecan.  He reported an approximate 20 pound weight loss.  Weight was documented as 201 pounds 8 ounces on January 3.  Patient denies nausea.  States he has a good appetite currently.  He thinks diarrhea is slowly improving.  He denies other nutrition impact symptoms.  He reports he has been drinking a smoothie with added protein every morning.  He generally eats lunch and dinner but has not been snacking.  He reports he is drinking Gatorade.  Nutrition diagnosis: Unintended weight loss related to metastatic colon cancer and associated treatments as evidenced by current weight 7% decreased from October 2023.  Intervention: Educated patient to try to consume smaller more frequent meals and snacks with added calories and protein. Reviewed strategies for improving diarrhea including increased soluble fibers such as bananas and applesauce.  Provided samples of Pedialyte as an option.  Also provided patient with Banatrol to help with loose stools.  Gave patient nutrition fact sheet on diarrhea and dehydration. Encourage patient to increase fluids overall and provided samples of boost breeze. Contact information provided.  Monitoring, evaluation, goals: Patient will tolerate adequate calories and protein to minimize loss of lean body mass.  Next visit: Wednesday, February 7 during infusion with  Vinnie Level.  **Disclaimer: This note was dictated with voice recognition software. Similar sounding words can inadvertently be transcribed and this note may contain transcription errors which may not have been corrected upon publication of note.**

## 2022-06-17 NOTE — Assessment & Plan Note (Signed)
stage IIIB (pT3N1cM0), MSI-stable, KRAS Q61H mutation (+), liver and peritoneal metastasis in 12/2019   --Diagnosed in 01/2018. S/p surgery and adjuvant FOLFOX 6 cycles . He did not tolerate well and declined additional treatment at that time. -restaging in 12/2019 revealed local recurrence in left abdominal wall and peritoneal metastasis, 2 hypermetabolic liver mets. Abdominal wall biopsy confirmed metastatic colorectal adenocarcinoma in 01/2020  -FO showed KRAS mutation (+), MSS -s/p CAPOX+beva, xeloda+beva and he had multiple abdominal surgery  -due to recent cancer progression, plan to change to FOLFIRI and beva -Due to severe diarrhea, irinotecan dosage was reduced for cycle 2, but he developed worsening diarrhea. -We discussed reduce irinotecan dose further, versus holding irinotecan for cycle 3 due to his travel plan after cycle 3.  After discussion, we decided to hold irinotecan today given his residual diarrhea. -Due to his travel plan, he wants to postpone his next treatment to July 14, 2022, which has been scheduled. -I discussed the balance of work and life, and encouraged him to cut back his working hours, and more focused on his own house and spend time with his family.

## 2022-06-17 NOTE — Addendum Note (Signed)
Addended by: [name] on: 06/17/2022 02:35 PM   Modules accepted: Orders

## 2022-06-17 NOTE — Patient Instructions (Signed)
[facility]  Discharge Instructions: Thank you for choosing [facility] to provide your oncology and hematology care.   If you have a lab appointment with the [facility], please go directly to the [facility] and check in at the registration area.   Wear comfortable clothing and clothing appropriate for easy access to any Portacath or PICC line.   We strive to give you quality time with your provider. You may need to reschedule your appointment if you arrive late (15 or more minutes).  Arriving late affects you and other patients whose appointments are after yours.  Also, if you miss three or more appointments without notifying the office, you may be dismissed from the clinic at the provider's discretion.      For prescription refill requests, have your pharmacy contact our office and allow 72 hours for refills to be completed.    Today you received the following chemotherapy and/or immunotherapy agents: bevacizumab and fluorouracil      To help prevent nausea and vomiting after your treatment, we encourage you to take your nausea medication as directed.  BELOW ARE SYMPTOMS THAT SHOULD BE REPORTED IMMEDIATELY: *FEVER GREATER THAN 100.4 F (38 C) OR HIGHER *CHILLS OR SWEATING *NAUSEA AND VOMITING THAT IS NOT CONTROLLED WITH YOUR NAUSEA MEDICATION *UNUSUAL SHORTNESS OF BREATH *UNUSUAL BRUISING OR BLEEDING *URINARY PROBLEMS (pain or burning when urinating, or frequent urination) *BOWEL PROBLEMS (unusual diarrhea, constipation, pain near the anus) TENDERNESS IN MOUTH AND THROAT WITH OR WITHOUT PRESENCE OF ULCERS (sore throat, sores in mouth, or a toothache) UNUSUAL RASH, SWELLING OR PAIN  UNUSUAL VAGINAL DISCHARGE OR ITCHING   Items with * indicate a potential emergency and should be followed up as soon as possible or go to the Emergency Department if any problems should occur.  Please show the CHEMOTHERAPY ALERT CARD or IMMUNOTHERAPY ALERT  CARD at check-in to the Emergency Department and triage nurse.  Should you have questions after your visit or need to cancel or reschedule your appointment, please contact [facility]  Dept: [phone]  and follow the prompts.  Office hours are 8:00 a.m. to 4:30 p.m. Monday - Friday. Please note that voicemails left after 4:00 p.m. may not be returned until the following business day.  We are closed weekends and major holidays. You have access to a nurse at all times for urgent questions. Please call the main number to the clinic Dept: [phone] and follow the prompts.   For any non-urgent questions, you may also contact your provider using MyChart. We now offer e-Visits for anyone 100 and older to request care online for non-urgent symptoms. For details visit [email or web address].   Also download the MyChart app! Go to the app store, search "MyChart", open the app, select [facility], and log in with your MyChart username and password.

## 2022-06-19 ENCOUNTER — Inpatient Hospital Stay: Payer: BC Managed Care – PPO

## 2022-06-19 VITALS — BP 117/77 | HR 74 | Temp 97.3°F | Resp 18

## 2022-06-19 DIAGNOSIS — C186 Malignant neoplasm of descending colon: Secondary | ICD-10-CM | POA: Diagnosis not present

## 2022-06-19 DIAGNOSIS — E860 Dehydration: Secondary | ICD-10-CM | POA: Diagnosis not present

## 2022-06-19 DIAGNOSIS — G893 Neoplasm related pain (acute) (chronic): Secondary | ICD-10-CM | POA: Diagnosis not present

## 2022-06-19 DIAGNOSIS — K5730 Diverticulosis of large intestine without perforation or abscess without bleeding: Secondary | ICD-10-CM | POA: Diagnosis not present

## 2022-06-19 DIAGNOSIS — C786 Secondary malignant neoplasm of retroperitoneum and peritoneum: Secondary | ICD-10-CM | POA: Diagnosis not present

## 2022-06-19 DIAGNOSIS — Z79899 Other long term (current) drug therapy: Secondary | ICD-10-CM | POA: Diagnosis not present

## 2022-06-19 DIAGNOSIS — Z5111 Encounter for antineoplastic chemotherapy: Secondary | ICD-10-CM | POA: Diagnosis not present

## 2022-06-19 DIAGNOSIS — C787 Secondary malignant neoplasm of liver and intrahepatic bile duct: Secondary | ICD-10-CM | POA: Diagnosis not present

## 2022-06-19 DIAGNOSIS — Z5112 Encounter for antineoplastic immunotherapy: Secondary | ICD-10-CM | POA: Diagnosis not present

## 2022-06-19 DIAGNOSIS — Z452 Encounter for adjustment and management of vascular access device: Secondary | ICD-10-CM | POA: Diagnosis not present

## 2022-06-19 MED ORDER — SODIUM CHLORIDE 0.9% FLUSH
10.0000 mL | INTRAVENOUS | Status: DC | PRN
  Administered 2022-06-19: 10 mL

## 2022-06-19 MED ORDER — HEPARIN SOD (PORK) LOCK FLUSH 100 UNIT/ML IV SOLN
500.0000 [IU] | Freq: Once | INTRAVENOUS | Status: AC | PRN
  Administered 2022-06-19: 500 [IU]

## 2022-06-24 ENCOUNTER — Inpatient Hospital Stay: Payer: BC Managed Care – PPO

## 2022-06-24 ENCOUNTER — Inpatient Hospital Stay: Payer: BC Managed Care – PPO | Admitting: Hematology

## 2022-06-24 DIAGNOSIS — C19 Malignant neoplasm of rectosigmoid junction: Secondary | ICD-10-CM | POA: Diagnosis not present

## 2022-06-24 DIAGNOSIS — C787 Secondary malignant neoplasm of liver and intrahepatic bile duct: Secondary | ICD-10-CM | POA: Diagnosis not present

## 2022-06-24 DIAGNOSIS — Z79899 Other long term (current) drug therapy: Secondary | ICD-10-CM | POA: Diagnosis not present

## 2022-06-24 DIAGNOSIS — C786 Secondary malignant neoplasm of retroperitoneum and peritoneum: Secondary | ICD-10-CM | POA: Diagnosis not present

## 2022-06-24 DIAGNOSIS — C186 Malignant neoplasm of descending colon: Secondary | ICD-10-CM | POA: Diagnosis not present

## 2022-06-24 DIAGNOSIS — Z23 Encounter for immunization: Secondary | ICD-10-CM | POA: Diagnosis not present

## 2022-06-25 ENCOUNTER — Other Ambulatory Visit: Payer: Self-pay

## 2022-06-25 ENCOUNTER — Encounter (INDEPENDENT_AMBULATORY_CARE_PROVIDER_SITE_OTHER): Payer: Self-pay

## 2022-06-25 DIAGNOSIS — C186 Malignant neoplasm of descending colon: Secondary | ICD-10-CM

## 2022-06-25 NOTE — Progress Notes (Signed)
Pt's wife Sharee Pimple called stating that pt met with Dr. Estelle Grumbles at [facility] and Dr. Estelle Grumbles wants pt to have another CT CAP w/contrast done by 07/10/2022 or 07/09/2022.  Notified Dr. Burr Medico of Dr. Lance Morin request.  Verbal order given to order CT CAP.  Authorization given.  Notified pt's wife Sharee Pimple that CT CAP has been authorized by [employer].  Sharee Pimple will contact Central Scheduling at [phone] to schedule the CT Scan.

## 2022-06-26 ENCOUNTER — Inpatient Hospital Stay: Payer: BC Managed Care – PPO

## 2022-07-02 ENCOUNTER — Encounter: Payer: Self-pay | Admitting: Hematology

## 2022-07-07 ENCOUNTER — Ambulatory Visit: Payer: BC Managed Care – PPO | Admitting: Internal Medicine

## 2022-07-07 MED FILL — Dexamethasone Sodium Phosphate Inj 100 MG/10ML: INTRAMUSCULAR | Qty: 1 | Status: AC

## 2022-07-07 NOTE — Progress Notes (Unsigned)
Patient Care Team: [name], MD as PCP - General (Hematology) [name], MD as Consulting Physician (Cardiology) [name], MD as Consulting Physician (Gastroenterology) [name], MD as Consulting Physician (General Surgery) [name], MD as Consulting Physician (General Surgery) [name], MD as Consulting Physician (Urology) [name], MD as Consulting Physician (Medical Oncology)   CHIEF COMPLAINT: Follow-up metastatic colon cancer  Oncology History Overview Note   Cancer Staging  Cancer of left colon [facility]) Staging form: Colon and Rectum, AJCC 8th Edition - Pathologic stage from 01/11/2018: Stage IIIB (pT3, pN1c, cM0) - Signed by [name], MD on 01/16/2018 Total positive nodes: 0 Histologic grading system: 4 grade system Histologic grade (G): G2     Cancer of left colon ([place])  01/10/2018 Imaging   CT AP W Contrast 01/10/18  IMPRESSION: Irregular soft tissue density causing stricture of the mid descending colon likely the site of obstruction for the dilated small bowel. 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 No evidence of perforation.   Equivocal findings involving the appendix measuring 1.2 cm at the appendiceal tip with mucosal enhancement. No adjacent free fluid or inflammatory change. Findings are nonspecific, but can be seen with early acute appendicitis.   Mild prostatic enlargement. Increased density over the posterior bladder base likely due to the large prostatic impression although cannot completely exclude a bladder mass. Urology protocol CT or ultrasound may be helpful for better evaluation.   Mild cholelithiasis.   Stable 1.5 cm cystic structure over the lower pole right kidney likely slightly hyperdense cyst.   Diverticulosis of the colon.   Aortic Atherosclerosis (ICD10-I70.0).   01/11/2018 Cancer Staging   Staging form: Colon and Rectum, AJCC 8th Edition - Pathologic stage from 01/11/2018: Stage IIIB (pT3, pN1c,  cM0) - Signed by [name], MD on 01/16/2018   01/11/2018 Surgery   LEFT COLON RESECTION, TAKEDOWN SPLENIC FLEXURE, COLOSTOMY by Dr. [name]    01/11/2018 Procedure   Colonoscopy 01/11/18 by Dr. [name]  - Malignant completely obstructing tumor in the mid descending colon. Tattooed. - Diverticulosis in the sigmoid colon. - Non-bleeding internal hemorrhoids. - No specimens collected.   01/11/2018 Pathology Results   Diagnosis 01/11/18  1. Colon, segmental resection for tumor, descending colon - INVASIVE COLORECTAL ADENOCARCINOMA, 4 CM. - TUMOR EXTENDS INTO PERICOLONIC CONNECTIVE TISSUE. - TUMOR FOCALLY INVOLVES RADIAL MARGIN. - ONE MESENTERIC TUMOR DEPOSIT. - THIRTEEN BENIGN LYMPH NODES (0/13). 2. Colon, segmental resection, splenic flexure - BENIGN COLON. - NO EVIDENCE OF MALIGNANCY .   01/11/2018 Tumor Marker   Baseline CEA at 3.4   01/16/2018 Initial Diagnosis   Cancer of left colon ([place])   01/23/2018 Imaging   CT CHEST WO CONTRAST IMPRESSION: 1. No evidence for metastatic disease within the chest. 2. Small left pleural effusion with underlying opacities which may represent atelectasis. Right basilar atelectasis. 3. Few foci of gas within the upper abdomen in the omentum with surrounding fat stranding, likely postsurgical 4. Aortic Atherosclerosis (ICD10-I70.0).   03/08/2018 - 05/15/2018 Chemotherapy   adjuvant FOLOFX. Due to side effects of neuropahty Oxaliplatin was stopped after 3 cycles and chemo was stopped after 6 cycles. Dave Vaughn declined completing 6 months of chemo treatment.     03/19/2018 Imaging   03/19/2018 CT AP IMPRESSION: 1. Interval partial left hemicolectomy and descending colostomy. 2. Heterogeneous soft tissue density along the left anterior renal fascia is most likely postoperative (favor fat necrosis). No well-defined fluid collection. 3. Mild left lower quadrant edema, new since 01/10/2018. This could be  postoperative. Superimposed sigmoid diverticulitis  and/or cystitis cannot be excluded. 4. Subtle hyperenhancing nodule within the anterior bladder dome cannot be excluded. Consider nonemergent cystoscopy. When this is performed, recommend attention to the left ureterovesicular junction and distal left ureter to evaluate questionable soft tissue fullness. 5. Cholelithiasis. 6.  Aortic Atherosclerosis (ICD10-I70.0). 7. Prostatomegaly.   11/13/2018 Imaging   CT CAP WO Contrast 11/13/18  IMPRESSION: 1. Reversal of left lower quadrant colostomy with sigmoid colon anastomosis. No complicating features. No findings for residual or recurrent tumor or metastatic disease involving the chest, abdomen or pelvis without contrast. 2. No acute abdominal/pelvic findings. 3. Gallbladder sludge and gallstones but no findings for acute cholecystitis. 4. Stable anterior abdominal wall hernia. 5. The right testicle is in the right inguinal canal.   10/03/2019 Imaging   CT CAP WO contrast  IMPRESSION: 1. New rounded density interposed between the prostate gland and anterior upper rectal wall could represent adenopathy or local extension of anterior rectal tumor. 2. Marked prostatomegaly, prostate volume 150 cubic cm. 3. Other imaging findings of potential clinical significance: Aortic Atherosclerosis (ICD10-I70.0). Coronary atherosclerosis. Trace right pleural effusion. Suspected cholelithiasis. Nonobstructive left nephrolithiasis. Multilevel lumbar impingement. Bilateral mildly retracted testicles. Hypodense exophytic lesion of the right kidney, most likely to be a cyst.   11/02/2019 Procedure   colonoscopy on 11/02/2019 by Dr. [name] Plan showed normal digital rectal exam, 3 polyps in the rectum, descending colon and cecum, and a prior sigmoid: Anastomosis characterized by erythema.  Dave Vaughn found an extrinsic nonobstructing medium-sized mass in the proximal rectum about 4 cm in length, no internal rectal mass.   Diagnosis Surgical [P], colon, cecum,  descending, rectal, polyp (3) - TUBULAR ADENOMA (TWO) - NO HIGH GRADE DYSPLASIA OR CARCINOMA. - COLONIC FRAGMENT WITH BENIGN LYMPHOID AGGREGATE.   12/07/2019 Imaging   MRI pelvis IMPRESSION: 1. Masslike area in the rectum suspicious for rectal neoplasm, likely T4b based on the appearance of soft tissue extending along the anterior peritoneal reflection and into the seminal vesicles. Correlation with recent colonoscopy results may be helpful. Area of anastomosis and other areas of the pelvis are not imaged on today's exam.   12/21/2019 PET scan   IMPRESSION: 1. Unfortunately evidence for peritoneal metastasis. Intensely hypermetabolic nodules along the LEFT pericolic gutter. Favor hypermetabolic mass anterior to the rectum to represent serosal implant along the ventral surface of the rectum. 2. local recurrence within the LEFT abdominal wall at site prior colostomy. Intense hypermetabolic thickening of the rectus muscle at this site. 3. Two hypermetabolic hepatic metastasis.   01/15/2020 Relapse/Recurrence   FINAL MICROSCOPIC DIAGNOSIS:   A. SOFT TISSUE, LEFT ABDOMINAL WALL, BIOPSY:  - Adenocarcinoma.  - See comment.   COMMENT:   The morphology is consistent with metastatic colorectal adenocarcinoma.    01/28/2020 -  Chemotherapy   First line FOLFIRI q2weeks starting 01/28/20 for 8 cycles.  -----Bevacizumab added with C2.  -----Changed to maintenance Xeloda 2000 mg twice daily for 2 weeks on/1 week off and bevacizumab 06/02/20. Starting with C3, dose reduce to '1500mg'$  BID due to skin toxicity.   05/05/2020 Imaging   IMPRESSION: 1. Improved appearance, with reduced size of the hepatic metastatic lesions and reduced size of the peritoneal tumor implants. 2. Other imaging findings of potential clinical significance: Notable prostatomegaly. Multilevel impingement in the lumbar spine. Dependent density in the gallbladder possibly from sludge or gallstones. Degenerative glenohumeral  arthropathy bilaterally. 3. Aortic atherosclerosis.   08/15/2020 Imaging   CT CAP  IMPRESSION: Chest Impression:   No evidence of  thoracic metastasis   Abdomen / Pelvis Impression:   1. Hepatic metastasis are no longer measurable by CT imaging. 2. Peritoneal nodular metastasis in the LEFT abdomen are decreased in size. 3. No evidence of new peritoneal disease. 4. Thickening and LEFT rectus muscle at site of prior metastasis. No interval change. 5. No evidence of new or progressive colorectal carcinoma.     11/27/2020 Imaging   CT CAP  IMPRESSION: 1. There are multiple small pulmonary nodules in the right upper lobe that are new or enlarged compared to prior examination but measuring 4 mm or smaller, suspicious for pulmonary metastatic disease. 2. Unchanged peritoneal nodule adjacent to the tip of the spleen measuring 1.3 x 1.2 cm. Slightly peritoneal nodule adjacent to the splenic flexure measuring no greater than 6 mm, previously 8 mm. 3. No significant change in previously hypermetabolic soft tissue mass centered about the left lower quadrant colostomy site. 4. Previously established hypermetabolic hepatic metastatic disease remains inapparent by CT. 5. Status post sigmoid colon resection and reanastomosis. 6. Prostatomegaly with thickening of the decompressed urinary bladder, likely secondary to chronic outlet obstruction. 7. Cholelithiasis.   Aortic Atherosclerosis (ICD10-I70.0).   01/29/2021 Imaging   CT CAP   IMPRESSION: 1. Minimal interval progression of bilateral pulmonary nodules, concerning for metastatic disease. 2. No substantial change in size of the peritoneal nodule at the inferior tip of the spleen and splenic flexure nodule. 3. Nodular soft tissue measured at the ostomy site previously is not evident today. 4. Marked prostatomegaly. 5. Cholelithiasis. 6. Aortic Atherosclerosis (ICD10-I70.0).   04/06/2021 Imaging   EXAM: CT ABDOMEN AND PELVIS  WITHOUT CONTRAST (Renal Stone Protocol)  IMPRESSION: 1. Bladder wall thickening with surrounding inflammation concerning for cystitis. 2. Partial colectomy. Colon is air-filled with relative transition just proximal to the anastomosis. Stricture or recurrence at this level would be difficult to exclude. No focal mass identified. 3. There is a new subcutaneous fluid collection at the level of prior ostomy in the left abdominal wall. Infection not excluded. 4. There is new intramuscular hyperdensity at the level of the prior ostomy, indeterminate. Findings may related to scarring, tumor recurrence or metastatic disease is not excluded. 5. Stable nodularity in the left upper quadrant. 6. Cholelithiasis. 7. Trace right pleural effusion. 8. Stable prostatomegaly. 9.  Aortic Atherosclerosis (ICD10-I70.0).   05/05/2021 Pathology Results   FINAL MICROSCOPIC DIAGNOSIS:   A. ABDOMINAL WALL SCAR, EXCISION:  -  Adenocarcinoma  -  See comment   COMMENT:  Morphologically consistent with colonic adenocarcinoma (similar to  previously reported ([id number]).   06/02/2021 Imaging   EXAM: CT CHEST, ABDOMEN, AND PELVIS WITH CONTRAST  IMPRESSION: 1. Progressive multifocal pulmonary metastatic disease. 2. New low-density hepatic lesions, likely metastases. 3. Progressive peritoneal implant near the splenic flexure of the colon. Small peritoneal implant inferior to the spleen appears unchanged. 4. Increased soft tissue nodularity near the midline incision in the upper anterior abdominal wall, suspicious for tumor recurrence at the incision. This could reflect keloid formation. 5. Fluid collection in the left anterior abdominal wall attributed to recent abdominal surgery with associated small incisional hernia containing fat. 6. Additional incidental findings including prostatomegaly, cholelithiasis and Aortic Atherosclerosis (ICD10-I70.0).   06/11/2021 - 11/26/2021 Chemotherapy   Patient is on  Treatment Plan : COLORECTALXeloda + Bevacizumab q21d     07/29/2021 Imaging   EXAMINATION: CT ABDOMEN PELVIS WO CONTRAST  Impression  1. Left anterior abdominal wall postsurgical changes with no recurrent hernia.  2. Stable indeterminate 3 cm ventral abdominal wall  mass located at the L1-2 level. Question scarring from prior surgery versus neoplasm.  3. Cholelithiasis.  4. Stable indeterminate bibasilar lung nodules. All of with a CT chest is recommended.  5. Indeterminate stable right hepatic lesions. Consider follow-up with an abdominal MRI with and without contrast for additional characterization.     12/17/2021 - 02/01/2022 Chemotherapy   Patient is on Treatment Plan : COLORECTAL CapeOx + Bevacizumab q21d     12/17/2021 - 03/17/2022 Chemotherapy   Patient is on Treatment Plan : COLORECTAL CapeOx + Bevacizumab q21d     05/13/2022 -  Chemotherapy   Patient is on Treatment Plan : COLORECTAL FOLFIRI + Bevacizumab q14d        CURRENT THERAPY: FOLFIRI/Bev a q. 14 days, starting 05/13/2022  INTERVAL HISTORY Dave Vaughn returns for follow-up and treatment as scheduled, last seen by Dr. Burr Medico 06/17/2022, irinotecan was held and received 5-FU/Bev alone due to travel and work schedule. Dave Vaughn still had some loose stool that "burned" but not bad compared to previous 2 cycles. Today, Dave Vaughn reports feeling the best Dave Vaughn has in over a year. Abdominal wall pain has resolved, has not needed meds in over a week. Dave Vaughn is walking more. Appetite and energy have improved. Dave Vaughn is getting a dental cleaning next week and is requesting prophylactic antibiotic. Has some small scale work travel coming up and a CT next Wednesday.   After emotional roller coaster from [place] discussion on 1/11, Dave Vaughn has decided to transfer his care to [facility].   ROS  All other systems reviewed and negative   Past Medical History:  Diagnosis Date   Adenocarcinoma, colon ([place]) dx'd 01/2018   Anemia    taking iron supplements   Anxiety    Atrial  fibrillation with RVR ([facility])    Colonic obstruction ([place]) 01/10/2018   Depression    Diverticulitis    Dysrhythmia    afib   History of kidney stones    Hypertension    Indwelling Foley catheter present    due to UTI   Nausea with vomiting 06/11/2022     Past Surgical History:  Procedure Laterality Date   ANKLE SURGERY Left    when Dave Vaughn was in college   COLON RESECTION N/A 01/11/2018   Procedure: LEFT COLON RESECTION, TAKEDOWN SPLENIC FLEXURE, COLOSTOMY;  Surgeon: [name], MD;  Location: [facility];  Service: General;  Laterality: N/A;   COLONOSCOPY  01/11/2018   Procedure: COLONOSCOPY;  Surgeon: Jackquline Denmark, MD;  Location: [facility];  Service: Endoscopy;;   COLONOSCOPY  05/10/2018   colonscopy  05/10/2018   EXCISION OF KELOID N/A 05/05/2021   Procedure: ABDOMINAL SCAR EXCISION;  Surgeon: Ralene Ok, MD;  Location: [place];  Service: General;  Laterality: N/A;   FOREIGN BODY REMOVAL ABDOMINAL N/A 03/09/2021   Procedure: EXCISION OF FOREIGN BODY;  Surgeon: Ralene Ok, MD;  Location: [place];  Service: General;  Laterality: N/A;   HERNIA REPAIR     HERNIA REPAIR  03/02/2019   EXPLORATORY LAPAROTOMY (N/A Abdomen)   INCISIONAL HERNIA REPAIR N/A 03/02/2019   Procedure: INCISIONAL HERNIA REPAIR , RECTORECTUS VS TAR HERNIA REPAIR;  Surgeon: Ralene Ok, MD;  Location: [place];  Service: General;  Laterality: N/A;   INSERTION OF MESH N/A 03/02/2019   Procedure: Insertion Of Mesh;  Surgeon: Ralene Ok, MD;  Location: [place];  Service: General;  Laterality: N/A;   LAPAROTOMY N/A 03/02/2019   Procedure: EXPLORATORY LAPAROTOMY;  Surgeon: Ralene Ok, MD;  Location: [place];  Service: General;  Laterality: N/A;   LAPAROTOMY N/A 03/09/2021   Procedure: EXPLORATION OF ABDOMINAL WALL;  Surgeon: Ralene Ok, MD;  Location: [place];  Service: General;  Laterality: N/A;   LYSIS OF ADHESION N/A 06/12/2018   Procedure: LYSIS OF ADHESIONS;  Surgeon: [name], MD;  Location: [facility];   Service: General;  Laterality: N/A;   LYSIS OF ADHESION N/A 03/02/2019   Procedure: Lysis Of Adhesion;  Surgeon: Ralene Ok, MD;  Location: [place];  Service: General;  Laterality: N/A;   PORTACATH PLACEMENT Right 03/07/2018   Procedure: INSERTION PORT-A-CATH RIGHT SUBCLAVIAN;  Surgeon: [name], MD;  Location: [place];  Service: General;  Laterality: Right;   PROCTOSCOPY N/A 06/12/2018   Procedure: RIGID PROCTOSCOPY;  Surgeon: [name], MD;  Location: [facility];  Service: General;  Laterality: N/A;   thumb surgery   2018   cyst removal     Outpatient Encounter Medications as of 07/08/2022  Medication Sig   ALPRAZolam (XANAX) 0.25 MG tablet TAKE 1 TABLET(0.25 MG) BY MOUTH AT BEDTIME AS NEEDED FOR ANXIETY   apixaban (ELIQUIS) 5 MG TABS tablet TAKE 1 TABLET(5 MG) BY MOUTH TWICE DAILY   b complex vitamins tablet Take 1 tablet by mouth daily.   diltiazem (CARDIZEM CD) 360 MG 24 hr capsule Take 1 capsule (360 mg total) by mouth daily.   diphenoxylate-atropine (LOMOTIL) 2.5-0.025 MG tablet Take 1-2 tablets by mouth 4 (four) times daily as needed for diarrhea or loose stools.   eszopiclone (LUNESTA) 1 MG TABS tablet Take 1 tablet (1 mg total) by mouth at bedtime as needed for sleep. Take immediately before bedtime   finasteride (PROSCAR) 5 MG tablet Take 5 mg by mouth daily.   levothyroxine (SYNTHROID) 75 MCG tablet TAKE 1 TABLET(75 MCG) BY MOUTH DAILY   lidocaine-prilocaine (EMLA) cream Apply 1 application topically as needed.   lidocaine-prilocaine (EMLA) cream Apply to affected area once   Multiple Vitamins-Iron (MULTIVITAMIN/IRON PO) Take 1 tablet by mouth daily.    ondansetron (ZOFRAN) 8 MG tablet Take 1 tablet (8 mg total) by mouth every 8 (eight) hours as needed for nausea, vomiting or refractory nausea / vomiting. Start on the third day after chemotherapy.   Oxycodone HCl 10 MG TABS Take 1 tablet (10 mg total) by mouth every 6 (six) hours as needed.   prochlorperazine (COMPAZINE) 10  MG tablet Take 1 tablet (10 mg total) by mouth every 6 (six) hours as needed for nausea or vomiting.   tamsulosin (FLOMAX) 0.4 MG CAPS capsule Take 0.4 mg by mouth 2 (two) times daily.    [DISCONTINUED] dexamethasone (DECADRON) 4 MG tablet Take 2 tablets (8 mg total) by mouth daily. Start the day after chemotherapy for 2 days. Take with food.   dexamethasone (DECADRON) 4 MG tablet Take 2 tablets (8 mg total) by mouth daily. Start the day after chemotherapy for 2 days. Take with food.   No facility-administered encounter medications on file as of 07/08/2022.     Today's Vitals   07/08/22 1154 07/08/22 1159  BP: (!) 149/78   Pulse: 89   Resp: 14   Temp: 98.7 F (37.1 C)   TempSrc: Oral   SpO2: 98%   Weight: 208 lb 9.6 oz (94.6 kg)   PainSc:  0-No pain   Body mass index is 29.93 kg/m.   PHYSICAL EXAM GENERAL:alert, no distress and comfortable SKIN: no rash  EYES: sclera clear NECK: without mass LYMPH:  no palpable cervical or supraclavicular lymphadenopathy  LUNGS: clear with normal  breathing effort HEART: regular rate & rhythm, trace bilateral lower extremity edema ABDOMEN: abdomen soft, non-tender and normal bowel sounds. Wearing binder NEURO: alert & oriented x 3 with fluent speech PAC without erythema    CBC    Component Value Date/Time   WBC 9.0 07/08/2022 1125   WBC 8.0 05/05/2021 1237   RBC 4.34 07/08/2022 1125   HGB 12.9 (L) 07/08/2022 1125   HCT 39.0 07/08/2022 1125   PLT 259 07/08/2022 1125   MCV 89.9 07/08/2022 1125   MCH 29.7 07/08/2022 1125   MCHC 33.1 07/08/2022 1125   RDW 16.9 (H) 07/08/2022 1125   LYMPHSABS 2.1 07/08/2022 1125   MONOABS 1.0 07/08/2022 1125   EOSABS 0.2 07/08/2022 1125   BASOSABS 0.1 07/08/2022 1125     CMP     Component Value Date/Time   NA 138 07/08/2022 1125   K 4.2 07/08/2022 1125   CL 101 07/08/2022 1125   CO2 30 07/08/2022 1125   GLUCOSE 130 (H) 07/08/2022 1125   BUN 18 07/08/2022 1125   CREATININE 1.00 07/08/2022 1125    CALCIUM 9.2 07/08/2022 1125   PROT 6.9 07/08/2022 1125   ALBUMIN 3.6 07/08/2022 1125   AST 21 07/08/2022 1125   ALT 18 07/08/2022 1125   ALKPHOS 136 (H) 07/08/2022 1125   BILITOT 0.8 07/08/2022 1125   GFRNONAA >60 07/08/2022 1125   GFRAA 57 (L) 03/10/2020 0837     ASSESSMENT & PLAN:Dave Vaughn is a 77 y.o. male with      1. Cancer of left colon, adenocarcinoma, stage IIIB (pT3N1cM0), MSI-stable, KRAS+ -Diagnosed in 01/2018. Treated with surgery and adjuvant FOLFOX. Due to side effects Oxaliplatin was stopped after 3 cycles and chemo was stopped after 6 cycles. Dave Vaughn declined completing 6 months of chemo treatment.  -unfortunately, Dave Vaughn developed local recurrence in left abdominal wall and peritoneal metastasis, 2 hypermetabolic liver lets in 06/8561. Abdominal wall biopsy conformed metastatic colorectal adenocarcinoma in 01/2020  -Foundation One showed Kras (+) positive, Dave Vaughn is not a candidate for EGFR inhibitor.   -S/p FOLFIRI/beva, CapeOx/beva, Xeloda/Beva, and multiple abdominal surgeries -Dave Vaughn began FOLFIRI/beva 05/13/22, tolerated first 2 cycles poorly with severe diarrhea, despite dose reduction. Irinotecan was held with cycle 3 due to work travel and pt preference. Dave Vaughn has recovered well  -Dave Vaughn appears stable. Dave Vaughn had severe diarrhea with first 2 cycles, once it resolved Dave Vaughn felt better overall. Pain resolved after cycle 3. Appetite and PS have also improved. This is a very good clinical indicator, Dave Vaughn is likely responding well -Labs reviewed, adequate for treatment. Will add back FOLFIRI at further 25% dose reduction today. Continue lomotil and supportive care.  -Dave Vaughn has restaging CT 2/7, then is transferring his care to Dr. Estelle Grumbles at [facility] -F/up open, if needed in the future    2.  Abdominal wall pain -Secondary to metastatic disease/deposits -Occurs when Dave Vaughn stands or walks for 10 minutes or so -managed with oxycodone q 3-4 hours, s/p cycle 3 Dave Vaughn has not taken in over a week -improved  after cycle 1 FOLFIRI/beva, resolved after cycle 3   3.  Anxiety -Dave Vaughn developed anxiety with diagnosis, fluctuates; secondary to #1, work, and other stressors -Johnnye Sima has helped him sleep so Dave Vaughn is not up at night as much thinking about things, but interested in something for the daytime -Not ideal candidate for benzo given that Dave Vaughn takes oxycodone every 3-4 hours -I previously recommended SSRI such as sertraline, Dave Vaughn declined due to the potential side effect profile and  having to be weaned off if Dave Vaughn wants to stop in the future -Dave Vaughn was referred to social work and psych for mental health    4. Atrial Fibrillation -continue Eliquis. Rate controlled.  Dave Vaughn can hold Eliquis periodically for bleeding hemorrhoids and surgeries -Continue follow-up with cardiology    PLAN: -Labs reviewed, CEA is pending -Proceed with cycle 4 FOLFIRI/beva, with Irinotecan 25% dose reduction (120 mg/m2) secondary to diarrhea -Restaging CT 2/7 -Refilled dex -Dental cleaning coming up, no clear need for prophylactic antibiotic on my end but will defer to Dr. Sarajane Jews ([place]) -Transferring care to [facility], appt with Dr. Estelle Grumbles 2/8 and next treatment 2/15 -F/up open     All questions were answered. The patient knows to call the clinic with any problems, questions or concerns. No barriers to learning were detected. I spent 20 minutes counseling the patient face to face. The total time spent in the appointment was 30 minutes and more than 50% was on counseling, review of test results, and coordination of care.   Cira Rue, NP-C 07/08/2022

## 2022-07-08 ENCOUNTER — Inpatient Hospital Stay: Payer: BC Managed Care – PPO | Admitting: Nutrition

## 2022-07-08 ENCOUNTER — Inpatient Hospital Stay (HOSPITAL_BASED_OUTPATIENT_CLINIC_OR_DEPARTMENT_OTHER): Payer: BC Managed Care – PPO | Admitting: Nurse Practitioner

## 2022-07-08 ENCOUNTER — Encounter: Payer: Self-pay | Admitting: Nurse Practitioner

## 2022-07-08 ENCOUNTER — Encounter: Payer: Self-pay | Admitting: Hematology

## 2022-07-08 ENCOUNTER — Inpatient Hospital Stay: Payer: BC Managed Care – PPO

## 2022-07-08 ENCOUNTER — Encounter: Payer: BC Managed Care – PPO | Admitting: Dietician

## 2022-07-08 ENCOUNTER — Inpatient Hospital Stay: Payer: BC Managed Care – PPO | Attending: Nurse Practitioner

## 2022-07-08 DIAGNOSIS — C186 Malignant neoplasm of descending colon: Secondary | ICD-10-CM

## 2022-07-08 DIAGNOSIS — Z7901 Long term (current) use of anticoagulants: Secondary | ICD-10-CM | POA: Insufficient documentation

## 2022-07-08 DIAGNOSIS — Z5112 Encounter for antineoplastic immunotherapy: Secondary | ICD-10-CM | POA: Insufficient documentation

## 2022-07-08 DIAGNOSIS — F419 Anxiety disorder, unspecified: Secondary | ICD-10-CM | POA: Insufficient documentation

## 2022-07-08 DIAGNOSIS — C7800 Secondary malignant neoplasm of unspecified lung: Secondary | ICD-10-CM | POA: Diagnosis not present

## 2022-07-08 DIAGNOSIS — Z452 Encounter for adjustment and management of vascular access device: Secondary | ICD-10-CM | POA: Insufficient documentation

## 2022-07-08 DIAGNOSIS — C786 Secondary malignant neoplasm of retroperitoneum and peritoneum: Secondary | ICD-10-CM | POA: Diagnosis not present

## 2022-07-08 DIAGNOSIS — I4891 Unspecified atrial fibrillation: Secondary | ICD-10-CM | POA: Insufficient documentation

## 2022-07-08 DIAGNOSIS — Z79899 Other long term (current) drug therapy: Secondary | ICD-10-CM | POA: Diagnosis not present

## 2022-07-08 DIAGNOSIS — Z5111 Encounter for antineoplastic chemotherapy: Secondary | ICD-10-CM | POA: Diagnosis not present

## 2022-07-08 DIAGNOSIS — C787 Secondary malignant neoplasm of liver and intrahepatic bile duct: Secondary | ICD-10-CM | POA: Insufficient documentation

## 2022-07-08 LAB — CBC WITH DIFFERENTIAL (CANCER CENTER ONLY)
Abs Immature Granulocytes: 0.03 10*3/uL (ref 0.00–0.07)
Basophils Absolute: 0.1 10*3/uL (ref 0.0–0.1)
Basophils Relative: 1 %
Eosinophils Absolute: 0.2 10*3/uL (ref 0.0–0.5)
Eosinophils Relative: 2 %
HCT: 39 % (ref 39.0–52.0)
Hemoglobin: 12.9 g/dL — ABNORMAL LOW (ref 13.0–17.0)
Immature Granulocytes: 0 %
Lymphocytes Relative: 23 %
Lymphs Abs: 2.1 10*3/uL (ref 0.7–4.0)
MCH: 29.7 pg (ref 26.0–34.0)
MCHC: 33.1 g/dL (ref 30.0–36.0)
MCV: 89.9 fL (ref 80.0–100.0)
Monocytes Absolute: 1 10*3/uL (ref 0.1–1.0)
Monocytes Relative: 11 %
Neutro Abs: 5.7 10*3/uL (ref 1.7–7.7)
Neutrophils Relative %: 63 %
Platelet Count: 259 10*3/uL (ref 150–400)
RBC: 4.34 MIL/uL (ref 4.22–5.81)
RDW: 16.9 % — ABNORMAL HIGH (ref 11.5–15.5)
WBC Count: 9 10*3/uL (ref 4.0–10.5)
nRBC: 0 % (ref 0.0–0.2)

## 2022-07-08 LAB — CMP (CANCER CENTER ONLY)
ALT: 18 U/L (ref 0–44)
AST: 21 U/L (ref 15–41)
Albumin: 3.6 g/dL (ref 3.5–5.0)
Alkaline Phosphatase: 136 U/L — ABNORMAL HIGH (ref 38–126)
Anion gap: 7 (ref 5–15)
BUN: 18 mg/dL (ref 8–23)
CO2: 30 mmol/L (ref 22–32)
Calcium: 9.2 mg/dL (ref 8.9–10.3)
Chloride: 101 mmol/L (ref 98–111)
Creatinine: 1 mg/dL (ref 0.61–1.24)
GFR, Estimated: >60 mL/min (ref >60)
Glucose, Bld: 130 mg/dL — ABNORMAL HIGH (ref 70–99)
Potassium: 4.2 mmol/L (ref 3.5–5.1)
Sodium: 138 mmol/L (ref 135–145)
Total Bilirubin: 0.8 mg/dL (ref 0.3–1.2)
Total Protein: 6.9 g/dL (ref 6.5–8.1)

## 2022-07-08 LAB — TOTAL PROTEIN, URINE DIPSTICK: Protein, ur: 100 mg/dL — AB

## 2022-07-08 LAB — CEA (ACCESS): CEA (CHCC): 24.82 ng/mL — ABNORMAL HIGH (ref 0.00–5.00)

## 2022-07-08 MED ORDER — SODIUM CHLORIDE 0.9 % IV SOLN
125.0000 mg/m2 | Freq: Once | INTRAVENOUS | Status: AC
  Administered 2022-07-08: 280 mg via INTRAVENOUS
  Filled 2022-07-08: qty 14

## 2022-07-08 MED ORDER — PALONOSETRON HCL INJECTION 0.25 MG/5ML
0.2500 mg | Freq: Once | INTRAVENOUS | Status: AC
  Administered 2022-07-08: 0.25 mg via INTRAVENOUS
  Filled 2022-07-08: qty 5

## 2022-07-08 MED ORDER — SODIUM CHLORIDE 0.9 % IV SOLN
2232.0000 mg/m2 | INTRAVENOUS | Status: DC
  Administered 2022-07-08: 5000 mg via INTRAVENOUS
  Filled 2022-07-08: qty 100

## 2022-07-08 MED ORDER — SODIUM CHLORIDE 0.9 % IV SOLN: Freq: Once | INTRAVENOUS | Status: AC

## 2022-07-08 MED ORDER — SODIUM CHLORIDE 0.9 % IV SOLN
10.0000 mg | Freq: Once | INTRAVENOUS | Status: AC
  Administered 2022-07-08: 10 mg via INTRAVENOUS
  Filled 2022-07-08: qty 10

## 2022-07-08 MED ORDER — DEXAMETHASONE 4 MG PO TABS: 8.0000 mg | ORAL_TABLET | Freq: Every day | ORAL | 1 refills | Status: DC

## 2022-07-08 MED ORDER — SODIUM CHLORIDE 0.9 % IV SOLN
5.0000 mg/kg | Freq: Once | INTRAVENOUS | Status: AC
  Administered 2022-07-08: 500 mg via INTRAVENOUS
  Filled 2022-07-08: qty 4

## 2022-07-08 MED ORDER — ATROPINE SULFATE 1 MG/ML IV SOLN
0.5000 mg | Freq: Once | INTRAVENOUS | Status: AC | PRN
  Administered 2022-07-08: 0.5 mg via INTRAVENOUS
  Filled 2022-07-08: qty 1

## 2022-07-08 MED ORDER — SODIUM CHLORIDE 0.9 % IV SOLN
400.0000 mg/m2 | Freq: Once | INTRAVENOUS | Status: AC
  Administered 2022-07-08: 896 mg via INTRAVENOUS
  Filled 2022-07-08: qty 44.8

## 2022-07-08 NOTE — Patient Instructions (Signed)

## 2022-07-08 NOTE — Progress Notes (Signed)
Nutrition follow-up completed with patient diagnosed with metastatic colon cancer and followed by Dr. Burr Medico.  He is receiving FOLFIRI/bevacizumab.  Weight is stable and documented as 208 pounds 9.6 ounces on February 1.  Labs include glucose 130.  Patient reports he feels great.  He is denying nutrition impact symptoms at this time.  Reports diarrhea did improve and his fatigue has resolved.  He continues to eat and drink frequently throughout the day.  He is using Gatorade as needed for added fluids and electrolytes.  He has no questions today.  Nutrition diagnosis: Unintended weight loss is stable.  Intervention: Encouraged patient to continue strategies for adequate calorie and protein intake. Provided tips for strategies for improving diarrhea if he should develop after this treatment. Provided additional samples of Banatrol. Continue increased fluids.  Monitoring, evaluation, goals: Patient will tolerate adequate calories and protein to minimize loss of lean body mass.  Next visit: To be scheduled with treatment as needed.  Patient agrees to contact RD with questions or concerns.  **Disclaimer: This note was dictated with voice recognition software. Similar sounding words can inadvertently be transcribed and this note may contain transcription errors which may not have been corrected upon publication of note.**

## 2022-07-08 NOTE — Progress Notes (Signed)
Ok to treat with UP 100 per LB

## 2022-07-08 NOTE — Progress Notes (Signed)
Per Cira Rue, NP - okay to run pump over 44 hours.  Pump reprogrammed to run at 5.68 ml/hr.

## 2022-07-08 NOTE — Progress Notes (Signed)
Reduce irinotecan to '125mg'$ /m2 per Regan Rakers NP

## 2022-07-08 NOTE — Patient Instructions (Signed)
[facility]   Discharge Instructions: Thank you for choosing [facility] to provide your oncology and hematology care.   If you have a lab appointment with the [facility], please go directly to the [facility] and check in at the registration area.   Wear comfortable clothing and clothing appropriate for easy access to any Portacath or PICC line.   We strive to give you quality time with your provider. You may need to reschedule your appointment if you arrive late (15 or more minutes).  Arriving late affects you and other patients whose appointments are after yours.  Also, if you miss three or more appointments without notifying the office, you may be dismissed from the clinic at the provider's discretion.      For prescription refill requests, have your pharmacy contact our office and allow 72 hours for refills to be completed.    Today you received the following chemotherapy and/or immunotherapy agents: Bevacizumab (Avastin), Irinotecan, Leucovorin, and Fluorouracil (Adrucil)       To help prevent nausea and vomiting after your treatment, we encourage you to take your nausea medication as directed.  BELOW ARE SYMPTOMS THAT SHOULD BE REPORTED IMMEDIATELY: *FEVER GREATER THAN 100.4 F (38 C) OR HIGHER *CHILLS OR SWEATING *NAUSEA AND VOMITING THAT IS NOT CONTROLLED WITH YOUR NAUSEA MEDICATION *UNUSUAL SHORTNESS OF BREATH *UNUSUAL BRUISING OR BLEEDING *URINARY PROBLEMS (pain or burning when urinating, or frequent urination) *BOWEL PROBLEMS (unusual diarrhea, constipation, pain near the anus) TENDERNESS IN MOUTH AND THROAT WITH OR WITHOUT PRESENCE OF ULCERS (sore throat, sores in mouth, or a toothache) UNUSUAL RASH, SWELLING OR PAIN  UNUSUAL VAGINAL DISCHARGE OR ITCHING   Items with * indicate a potential emergency and should be followed up as soon as possible or go to the Emergency Department if any problems should occur.  Please  show the CHEMOTHERAPY ALERT CARD or IMMUNOTHERAPY ALERT CARD at check-in to the Emergency Department and triage nurse.  Should you have questions after your visit or need to cancel or reschedule your appointment, please contact [facility]  Dept: [phone]  and follow the prompts.  Office hours are 8:00 a.m. to 4:30 p.m. Monday - Friday. Please note that voicemails left after 4:00 p.m. may not be returned until the following business day.  We are closed weekends and major holidays. You have access to a nurse at all times for urgent questions. Please call the main number to the clinic Dept: [phone] and follow the prompts.   For any non-urgent questions, you may also contact your provider using MyChart. We now offer e-Visits for anyone 13 and older to request care online for non-urgent symptoms. For details visit [email or web address].   Also download the MyChart app! Go to the app store, search "MyChart", open the app, select [facility], and log in with your MyChart username and password.  The chemotherapy medication bag should finish at 46 hours, 96 hours, or 7 days. For example, if your pump is scheduled for 46 hours and it was put on at 4:00 p.m., it should finish at 2:00 p.m. the day it is scheduled to come off regardless of your appointment time.     Estimated time to finish at              .   If the display on your pump reads "Low Volume" and it is beeping, take the batteries out of the pump and come  to the cancer center for it to be taken off.   If the pump alarms go off prior to the pump reading "Low Volume" then call [phone] and someone can assist you.  If the plunger comes out and the chemotherapy medication is leaking out, please use your home chemo spill kit to clean up the spill. Do NOT use paper towels or other household products.  If you have problems or questions regarding your pump, please call either [phone] (24 hours  a day) or the cancer center Monday-Friday 8:00 a.m.- 4:30 p.m. at the clinic number and we will assist you. If you are unable to get assistance, then go to the nearest Emergency Department and ask the staff to contact the IV team for assistance.

## 2022-07-10 ENCOUNTER — Inpatient Hospital Stay: Payer: BC Managed Care – PPO

## 2022-07-10 VITALS — BP 133/81 | HR 93 | Temp 97.9°F | Resp 16

## 2022-07-10 DIAGNOSIS — Z452 Encounter for adjustment and management of vascular access device: Secondary | ICD-10-CM | POA: Diagnosis not present

## 2022-07-10 DIAGNOSIS — Z5112 Encounter for antineoplastic immunotherapy: Secondary | ICD-10-CM | POA: Diagnosis not present

## 2022-07-10 DIAGNOSIS — I4891 Unspecified atrial fibrillation: Secondary | ICD-10-CM | POA: Diagnosis not present

## 2022-07-10 DIAGNOSIS — C186 Malignant neoplasm of descending colon: Secondary | ICD-10-CM | POA: Diagnosis not present

## 2022-07-10 DIAGNOSIS — Z5111 Encounter for antineoplastic chemotherapy: Secondary | ICD-10-CM | POA: Diagnosis not present

## 2022-07-10 DIAGNOSIS — C787 Secondary malignant neoplasm of liver and intrahepatic bile duct: Secondary | ICD-10-CM | POA: Diagnosis not present

## 2022-07-10 DIAGNOSIS — Z79899 Other long term (current) drug therapy: Secondary | ICD-10-CM | POA: Diagnosis not present

## 2022-07-10 DIAGNOSIS — F419 Anxiety disorder, unspecified: Secondary | ICD-10-CM | POA: Diagnosis not present

## 2022-07-10 DIAGNOSIS — Z7901 Long term (current) use of anticoagulants: Secondary | ICD-10-CM | POA: Diagnosis not present

## 2022-07-10 DIAGNOSIS — C7800 Secondary malignant neoplasm of unspecified lung: Secondary | ICD-10-CM | POA: Diagnosis not present

## 2022-07-10 DIAGNOSIS — C786 Secondary malignant neoplasm of retroperitoneum and peritoneum: Secondary | ICD-10-CM | POA: Diagnosis not present

## 2022-07-10 MED ORDER — HEPARIN SOD (PORK) LOCK FLUSH 100 UNIT/ML IV SOLN
500.0000 [IU] | Freq: Once | INTRAVENOUS | Status: AC | PRN
  Administered 2022-07-10: 500 [IU]

## 2022-07-10 MED ORDER — SODIUM CHLORIDE 0.9% FLUSH
10.0000 mL | INTRAVENOUS | Status: DC | PRN
  Administered 2022-07-10: 10 mL

## 2022-07-13 ENCOUNTER — Encounter: Payer: Self-pay | Admitting: Nurse Practitioner

## 2022-07-13 ENCOUNTER — Ambulatory Visit (HOSPITAL_COMMUNITY): Payer: BC Managed Care – PPO

## 2022-07-14 ENCOUNTER — Ambulatory Visit (HOSPITAL_COMMUNITY)
Admission: RE | Admit: 2022-07-14 | Discharge: 2022-07-14 | Disposition: A | Discharge status: Home / Self Care | Payer: BC Managed Care – PPO | Source: Ambulatory Visit | Attending: Hematology | Admitting: Hematology

## 2022-07-14 ENCOUNTER — Other Ambulatory Visit: Payer: BC Managed Care – PPO

## 2022-07-14 ENCOUNTER — Ambulatory Visit: Payer: BC Managed Care – PPO | Admitting: Nurse Practitioner

## 2022-07-14 ENCOUNTER — Encounter: Payer: BC Managed Care – PPO | Admitting: Dietician

## 2022-07-14 ENCOUNTER — Ambulatory Visit: Payer: BC Managed Care – PPO

## 2022-07-14 DIAGNOSIS — C186 Malignant neoplasm of descending colon: Secondary | ICD-10-CM | POA: Diagnosis not present

## 2022-07-14 DIAGNOSIS — I700 Atherosclerosis of aorta: Secondary | ICD-10-CM | POA: Diagnosis not present

## 2022-07-14 DIAGNOSIS — C787 Secondary malignant neoplasm of liver and intrahepatic bile duct: Secondary | ICD-10-CM | POA: Diagnosis not present

## 2022-07-14 DIAGNOSIS — C7802 Secondary malignant neoplasm of left lung: Secondary | ICD-10-CM | POA: Diagnosis not present

## 2022-07-14 DIAGNOSIS — K6389 Other specified diseases of intestine: Secondary | ICD-10-CM | POA: Diagnosis not present

## 2022-07-14 DIAGNOSIS — C7801 Secondary malignant neoplasm of right lung: Secondary | ICD-10-CM | POA: Diagnosis not present

## 2022-07-14 DIAGNOSIS — C189 Malignant neoplasm of colon, unspecified: Secondary | ICD-10-CM | POA: Diagnosis not present

## 2022-07-14 DIAGNOSIS — N400 Enlarged prostate without lower urinary tract symptoms: Secondary | ICD-10-CM | POA: Diagnosis not present

## 2022-07-14 DIAGNOSIS — C782 Secondary malignant neoplasm of pleura: Secondary | ICD-10-CM | POA: Diagnosis not present

## 2022-07-14 MED ORDER — HEPARIN SOD (PORK) LOCK FLUSH 100 UNIT/ML IV SOLN
500.0000 [IU] | Freq: Once | INTRAVENOUS | Status: AC
  Administered 2022-07-14: 500 [IU] via INTRAVENOUS

## 2022-07-14 MED ORDER — SODIUM CHLORIDE (PF) 0.9 % IJ SOLN
INTRAMUSCULAR | Status: AC
  Filled 2022-07-14: qty 50

## 2022-07-14 MED ORDER — IOHEXOL 300 MG/ML  SOLN
100.0000 mL | Freq: Once | INTRAMUSCULAR | Status: AC | PRN
  Administered 2022-07-14: 100 mL via INTRAVENOUS

## 2022-07-14 MED ORDER — HEPARIN SOD (PORK) LOCK FLUSH 100 UNIT/ML IV SOLN
INTRAVENOUS | Status: AC
  Filled 2022-07-14: qty 5

## 2022-07-15 ENCOUNTER — Ambulatory Visit: Payer: BC Managed Care – PPO | Admitting: Internal Medicine

## 2022-07-15 ENCOUNTER — Other Ambulatory Visit: Payer: Self-pay

## 2022-07-15 ENCOUNTER — Encounter: Payer: Self-pay | Admitting: Family Medicine

## 2022-07-15 ENCOUNTER — Encounter: Payer: Self-pay | Admitting: Hematology

## 2022-07-15 ENCOUNTER — Ambulatory Visit (INDEPENDENT_AMBULATORY_CARE_PROVIDER_SITE_OTHER): Payer: BC Managed Care – PPO | Admitting: Family Medicine

## 2022-07-15 VITALS — BP 110/78 | HR 98 | Temp 98.6°F | Wt 199.0 lb

## 2022-07-15 DIAGNOSIS — C7802 Secondary malignant neoplasm of left lung: Secondary | ICD-10-CM | POA: Diagnosis not present

## 2022-07-15 DIAGNOSIS — J40 Bronchitis, not specified as acute or chronic: Secondary | ICD-10-CM | POA: Diagnosis not present

## 2022-07-15 DIAGNOSIS — C787 Secondary malignant neoplasm of liver and intrahepatic bile duct: Secondary | ICD-10-CM | POA: Diagnosis not present

## 2022-07-15 DIAGNOSIS — C7801 Secondary malignant neoplasm of right lung: Secondary | ICD-10-CM | POA: Diagnosis not present

## 2022-07-15 DIAGNOSIS — R0989 Other specified symptoms and signs involving the circulatory and respiratory systems: Secondary | ICD-10-CM

## 2022-07-15 DIAGNOSIS — J029 Acute pharyngitis, unspecified: Secondary | ICD-10-CM | POA: Diagnosis not present

## 2022-07-15 DIAGNOSIS — C186 Malignant neoplasm of descending colon: Secondary | ICD-10-CM | POA: Diagnosis not present

## 2022-07-15 LAB — POCT INFLUENZA A/B
Influenza A, POC: NEGATIVE
Influenza B, POC: NEGATIVE

## 2022-07-15 LAB — POCT RAPID STREP A (OFFICE): Rapid Strep A Screen: NEGATIVE

## 2022-07-15 LAB — POC COVID19 BINAXNOW: SARS Coronavirus 2 Ag: NEGATIVE

## 2022-07-15 MED ORDER — DOXYCYCLINE HYCLATE 100 MG PO CAPS: 100.0000 mg | ORAL_CAPSULE | Freq: Two times a day (BID) | ORAL | 0 refills | Status: AC

## 2022-07-15 NOTE — Addendum Note (Signed)
Addended by: Alysia Penna A on: 07/15/2022 11:02 AM   Modules accepted: Level of Service

## 2022-07-15 NOTE — Progress Notes (Signed)
   Subjective:    Patient ID: Dave Vaughn, male    DOB: [birth year], 77 y.o.   MRN: [id number]  HPI Here for one week of stuffy head, PND, and coughing up yellow sputum. No ST or fever or chest pain or SOB. Taking Mucinex. He is currently taking chemotherapy for colon cancer.    Review of Systems  Constitutional: Negative.   HENT:  Positive for congestion and postnasal drip. Negative for ear pain, sinus pressure and sore throat.   Eyes: Negative.   Respiratory:  Positive for cough. Negative for shortness of breath and wheezing.        Objective:   Physical Exam Constitutional:      Appearance: Normal appearance. He is not ill-appearing.  HENT:     Right Ear: Tympanic membrane, ear canal and external ear normal.     Left Ear: Tympanic membrane, ear canal and external ear normal.     Nose: Nose normal.     Mouth/Throat:     Pharynx: Oropharynx is clear.  Eyes:     Conjunctiva/sclera: Conjunctivae normal.  Pulmonary:     Effort: Pulmonary effort is normal.     Breath sounds: Normal breath sounds.  Lymphadenopathy:     Cervical: No cervical adenopathy.  Neurological:     Mental Status: He is alert.           Assessment & Plan:  Bronchitis, treat with 10 days of Doxycycline.  Alysia Penna, MD

## 2022-07-15 NOTE — Addendum Note (Signed)
Addended by: Wyvonne Lenz on: 07/15/2022 12:08 PM   Modules accepted: Orders

## 2022-07-16 ENCOUNTER — Encounter: Payer: Self-pay | Admitting: Hematology

## 2022-07-20 ENCOUNTER — Telehealth: Payer: Self-pay | Admitting: Nurse Practitioner

## 2022-07-20 NOTE — Telephone Encounter (Signed)
I called Mr. Nuttall to review CT, which shows stable liver lesions, decreased abdominal wall met, and stable lung lesions except two which have enlarged. He recently had URI. CEA 46 --> down to 24 on this regimen. He notes he tolerated last chemo cycle well, with irinotecan at 125 mg/m2. He is meeting with Dr. Estelle Grumbles tomorrow, will defer further management to [facility]. I wished him well and he appreciated the call. Knows he can reach out in the future if needed.   Cira Rue, NP

## 2022-07-22 DIAGNOSIS — Z5112 Encounter for antineoplastic immunotherapy: Secondary | ICD-10-CM | POA: Diagnosis not present

## 2022-07-22 DIAGNOSIS — Z79899 Other long term (current) drug therapy: Secondary | ICD-10-CM | POA: Diagnosis not present

## 2022-07-22 DIAGNOSIS — C7800 Secondary malignant neoplasm of unspecified lung: Secondary | ICD-10-CM | POA: Diagnosis not present

## 2022-07-22 DIAGNOSIS — C186 Malignant neoplasm of descending colon: Secondary | ICD-10-CM | POA: Diagnosis not present

## 2022-07-22 DIAGNOSIS — Z5111 Encounter for antineoplastic chemotherapy: Secondary | ICD-10-CM | POA: Diagnosis not present

## 2022-07-22 DIAGNOSIS — C19 Malignant neoplasm of rectosigmoid junction: Secondary | ICD-10-CM | POA: Diagnosis not present

## 2022-07-22 DIAGNOSIS — C786 Secondary malignant neoplasm of retroperitoneum and peritoneum: Secondary | ICD-10-CM | POA: Diagnosis not present

## 2022-07-22 DIAGNOSIS — Z23 Encounter for immunization: Secondary | ICD-10-CM | POA: Diagnosis not present

## 2022-07-22 DIAGNOSIS — R079 Chest pain, unspecified: Secondary | ICD-10-CM | POA: Diagnosis not present

## 2022-07-22 DIAGNOSIS — C787 Secondary malignant neoplasm of liver and intrahepatic bile duct: Secondary | ICD-10-CM | POA: Diagnosis not present

## 2022-08-01 ENCOUNTER — Encounter: Payer: Self-pay | Admitting: Hematology

## 2022-08-05 DIAGNOSIS — C786 Secondary malignant neoplasm of retroperitoneum and peritoneum: Secondary | ICD-10-CM | POA: Diagnosis not present

## 2022-08-05 DIAGNOSIS — T451X5A Adverse effect of antineoplastic and immunosuppressive drugs, initial encounter: Secondary | ICD-10-CM | POA: Diagnosis not present

## 2022-08-05 DIAGNOSIS — C19 Malignant neoplasm of rectosigmoid junction: Secondary | ICD-10-CM | POA: Diagnosis not present

## 2022-08-05 DIAGNOSIS — C186 Malignant neoplasm of descending colon: Secondary | ICD-10-CM | POA: Diagnosis not present

## 2022-08-05 DIAGNOSIS — R079 Chest pain, unspecified: Secondary | ICD-10-CM | POA: Diagnosis not present

## 2022-08-05 DIAGNOSIS — Z5112 Encounter for antineoplastic immunotherapy: Secondary | ICD-10-CM | POA: Diagnosis not present

## 2022-08-05 DIAGNOSIS — Z79899 Other long term (current) drug therapy: Secondary | ICD-10-CM | POA: Diagnosis not present

## 2022-08-05 DIAGNOSIS — K521 Toxic gastroenteritis and colitis: Secondary | ICD-10-CM | POA: Diagnosis not present

## 2022-08-05 DIAGNOSIS — C7800 Secondary malignant neoplasm of unspecified lung: Secondary | ICD-10-CM | POA: Diagnosis not present

## 2022-08-05 DIAGNOSIS — C787 Secondary malignant neoplasm of liver and intrahepatic bile duct: Secondary | ICD-10-CM | POA: Diagnosis not present

## 2022-08-05 DIAGNOSIS — Z5111 Encounter for antineoplastic chemotherapy: Secondary | ICD-10-CM | POA: Diagnosis not present

## 2022-08-07 ENCOUNTER — Other Ambulatory Visit: Payer: Self-pay

## 2022-08-09 ENCOUNTER — Ambulatory Visit (INDEPENDENT_AMBULATORY_CARE_PROVIDER_SITE_OTHER): Payer: BC Managed Care – PPO | Admitting: Family Medicine

## 2022-08-09 ENCOUNTER — Encounter: Payer: Self-pay | Admitting: Family Medicine

## 2022-08-09 VITALS — BP 98/60 | HR 61 | Temp 98.2°F | Ht 70.0 in | Wt 208.9 lb

## 2022-08-09 DIAGNOSIS — C186 Malignant neoplasm of descending colon: Secondary | ICD-10-CM | POA: Diagnosis not present

## 2022-08-09 DIAGNOSIS — R739 Hyperglycemia, unspecified: Secondary | ICD-10-CM

## 2022-08-09 DIAGNOSIS — I482 Chronic atrial fibrillation, unspecified: Secondary | ICD-10-CM

## 2022-08-09 DIAGNOSIS — Z13220 Encounter for screening for lipoid disorders: Secondary | ICD-10-CM | POA: Diagnosis not present

## 2022-08-09 DIAGNOSIS — E039 Hypothyroidism, unspecified: Secondary | ICD-10-CM

## 2022-08-09 MED ORDER — OXYCODONE HCL 10 MG PO TABS: 10.0000 mg | ORAL_TABLET | Freq: Every day | ORAL | 0 refills | Status: DC | PRN

## 2022-08-09 NOTE — Progress Notes (Signed)
Established Patient Office Visit  Subjective   Patient ID: Dave Vaughn, male    DOB: 06/17/1945  Age: 77 y.o. MRN: [id number]  Chief Complaint  Patient presents with  . Establish Care    Patient is here for transition of care visit. He has a history of colon cancer and is receiving infusions, states that it is going well, has a small amount of diarrhea after the infusions but otherwise is tolerating it well. Was seeing Dr. Burr Medico at [facility] but has moved to [facility] for treatment. Dr. Burr Medico was prescribing oxycodone 10 mg once daily as needed, pt reports he usually only takes it about 3 times a week. I reviewed the controlled substance policy with the patient and it was signed in office. I reviewed his PDMP also, pt reports he is no longer taking the xanax he was prescribed.   Hypothyroidism-- on 75 mcg daily of levothyroxine. He reports he has not had bloodwork done to check this in a long time. He denies any weight gain, depression or constipation symptoms.     Current Outpatient Medications  Medication Instructions  . apixaban (ELIQUIS) 5 MG TABS tablet TAKE 1 TABLET(5 MG) BY MOUTH TWICE DAILY  . b complex vitamins tablet 1 tablet, Oral, Daily  . dexamethasone (DECADRON) 8 mg, Oral, Daily, Start the day after chemotherapy for 2 days. Take with food.  . diltiazem (CARDIZEM CD) 360 mg, Oral, Daily  . diphenoxylate-atropine (LOMOTIL) 2.5-0.025 MG tablet 1-2 tablets, Oral, 4 times daily PRN  . eszopiclone (LUNESTA) 1 mg, Oral, At bedtime PRN, Take immediately before bedtime  . finasteride (PROSCAR) 5 mg, Oral, Daily  . levothyroxine (SYNTHROID) 75 MCG tablet TAKE 1 TABLET(75 MCG) BY MOUTH DAILY  . lidocaine-prilocaine (EMLA) cream 1 application , Topical, As needed  . Multiple Vitamins-Iron (MULTIVITAMIN/IRON PO) 1 tablet, Oral, Daily  . ondansetron (ZOFRAN) 8 mg, Oral, Every 8 hours PRN, Start on the third day after chemotherapy.  . Oxycodone HCl 10 mg, Oral, Daily PRN   . prochlorperazine (COMPAZINE) 10 mg, Oral, Every 6 hours PRN  . tamsulosin (FLOMAX) 0.4 mg, Oral, 2 times daily    Patient Active Problem List   Diagnosis Date Noted  . DNR (do not resuscitate) 06/17/2022  . Dehydration 06/11/2022  . Nausea with vomiting 06/11/2022  . Cancer related pain 05/13/2022  . Hypothyroid 11/24/2020  . Goals of care, counseling/discussion 01/17/2020  . S/P hernia repair 03/02/2019  . CKD (chronic kidney disease) stage 3, GFR 30-59 ml/min (HCC) 06/13/2018  . Hypomagnesemia 06/13/2018  . 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 Chronic anticoagulation 06/12/2018  . Port-A-Cath in place 03/22/2018  . Cancer of left colon ([place]) 01/16/2018  . Chronic atrial fibrillation 01/11/2018      Review of Systems  All other systems reviewed and are negative.     Objective:     BP 98/60 (BP Location: Left Arm, Patient Position: Sitting, Cuff Size: Large)   Pulse 61   Temp 98.2 F (36.8 C) (Oral)   Ht '5\' 10"'$  (1.778 m)   Wt 208 lb 14.4 oz (94.8 kg)   SpO2 98%   BMI 29.97 kg/m  {Vitals History (Optional):23777}  Physical Exam Vitals reviewed.  Constitutional:      Appearance: Normal appearance. He is well-groomed and normal weight.  Eyes:     Conjunctiva/sclera: Conjunctivae normal.  Neck:     Thyroid: No thyromegaly.  Cardiovascular:     Rate and Rhythm: Normal  rate and regular rhythm.     Heart sounds: S1 normal and S2 normal. No murmur heard. Pulmonary:     Effort: Pulmonary effort is normal.     Breath sounds: Normal breath sounds and air entry. No rales.  Abdominal:     General: Bowel sounds are normal.  Neurological:     General: No focal deficit present.     Mental Status: He is alert and oriented to person, place, and time.     Gait: Gait is intact.  Psychiatric:        Mood and Affect: Mood and affect normal.     No results found for any visits on 08/09/22.  {Labs (Optional):23779}  The  ASCVD Risk score (Arnett DK, et al., 2019) failed to calculate for the following reasons:   Cannot find a previous HDL lab   Cannot find a previous total cholesterol lab    Assessment & Plan:   Problem List Items Addressed This Visit       Unprioritized   Cancer of left colon ([place])   Relevant Medications   Oxycodone HCl 10 MG TABS   Hypothyroid - Primary   Relevant Orders   TSH   Other Visit Diagnoses     Lipid screening       Relevant Orders   Lipid Panel   Hyperglycemia       Relevant Orders   Hemoglobin A1c       Return in about 6 months (around 02/09/2023) for follow up and medication refills.    Farrel Conners, MD

## 2022-08-10 NOTE — Assessment & Plan Note (Signed)
HR is controlled today on the current medications, will continue the eliquis 5 mg BID and diltiazem 360 mg daily for rate control, continues to follow with cardiology.

## 2022-08-10 NOTE — Assessment & Plan Note (Signed)
Currently on levothyroxine 75 mcg daily, however this has not been followed up on in some time. I will order a new TSH today and will likely need to adjust his dose.

## 2022-08-10 NOTE — Assessment & Plan Note (Signed)
Metastatic, reviewed last CT abd. Patient has moved to the [facility] center and is getting infusions every 2 weeks for this. He is taking oxycodone 10 mg about 3 times a week for severe pain. I spent 30 minutes reviewing his chart and discussing the controlled substance policy. I will take over prescribing this medication for him. 10 mg daily PRN sent to the pharmacy today.

## 2022-08-11 ENCOUNTER — Other Ambulatory Visit: Payer: Self-pay

## 2022-08-16 ENCOUNTER — Other Ambulatory Visit (INDEPENDENT_AMBULATORY_CARE_PROVIDER_SITE_OTHER): Payer: BC Managed Care – PPO

## 2022-08-16 DIAGNOSIS — Z13220 Encounter for screening for lipoid disorders: Secondary | ICD-10-CM | POA: Diagnosis not present

## 2022-08-16 DIAGNOSIS — E039 Hypothyroidism, unspecified: Secondary | ICD-10-CM

## 2022-08-16 DIAGNOSIS — R739 Hyperglycemia, unspecified: Secondary | ICD-10-CM | POA: Diagnosis not present

## 2022-08-16 DIAGNOSIS — E782 Mixed hyperlipidemia: Secondary | ICD-10-CM

## 2022-08-16 LAB — TSH: TSH: 5.29 u[IU]/mL (ref 0.35–5.50)

## 2022-08-16 LAB — LIPID PANEL
Cholesterol: 204 mg/dL — ABNORMAL HIGH (ref 0–200)
HDL: 43.4 mg/dL (ref >39.00)
LDL Cholesterol: 129 mg/dL — ABNORMAL HIGH (ref 0–99)
NonHDL: 160.63
Total CHOL/HDL Ratio: 5
Triglycerides: 158 mg/dL — ABNORMAL HIGH (ref 0.0–149.0)
VLDL: 31.6 mg/dL (ref 0.0–40.0)

## 2022-08-16 LAB — HEMOGLOBIN A1C: Hgb A1c MFr Bld: 5.8 % (ref 4.6–6.5)

## 2022-08-19 DIAGNOSIS — T451X5A Adverse effect of antineoplastic and immunosuppressive drugs, initial encounter: Secondary | ICD-10-CM | POA: Diagnosis not present

## 2022-08-19 DIAGNOSIS — C7800 Secondary malignant neoplasm of unspecified lung: Secondary | ICD-10-CM | POA: Diagnosis not present

## 2022-08-19 DIAGNOSIS — Z23 Encounter for immunization: Secondary | ICD-10-CM | POA: Diagnosis not present

## 2022-08-19 DIAGNOSIS — Z5112 Encounter for antineoplastic immunotherapy: Secondary | ICD-10-CM | POA: Diagnosis not present

## 2022-08-19 DIAGNOSIS — C786 Secondary malignant neoplasm of retroperitoneum and peritoneum: Secondary | ICD-10-CM | POA: Diagnosis not present

## 2022-08-19 DIAGNOSIS — C787 Secondary malignant neoplasm of liver and intrahepatic bile duct: Secondary | ICD-10-CM | POA: Diagnosis not present

## 2022-08-19 DIAGNOSIS — Z5111 Encounter for antineoplastic chemotherapy: Secondary | ICD-10-CM | POA: Diagnosis not present

## 2022-08-19 DIAGNOSIS — G893 Neoplasm related pain (acute) (chronic): Secondary | ICD-10-CM | POA: Diagnosis not present

## 2022-08-19 DIAGNOSIS — C186 Malignant neoplasm of descending colon: Secondary | ICD-10-CM | POA: Diagnosis not present

## 2022-08-19 DIAGNOSIS — Z79899 Other long term (current) drug therapy: Secondary | ICD-10-CM | POA: Diagnosis not present

## 2022-08-19 DIAGNOSIS — C792 Secondary malignant neoplasm of skin: Secondary | ICD-10-CM | POA: Diagnosis not present

## 2022-08-19 DIAGNOSIS — K521 Toxic gastroenteritis and colitis: Secondary | ICD-10-CM | POA: Diagnosis not present

## 2022-08-20 MED ORDER — ROSUVASTATIN CALCIUM 5 MG PO TABS: 5.0000 mg | ORAL_TABLET | Freq: Every day | ORAL | 3 refills | Status: DC

## 2022-09-01 ENCOUNTER — Other Ambulatory Visit: Payer: Self-pay | Admitting: Cardiology

## 2022-09-02 DIAGNOSIS — Z5111 Encounter for antineoplastic chemotherapy: Secondary | ICD-10-CM | POA: Diagnosis not present

## 2022-09-02 DIAGNOSIS — Z23 Encounter for immunization: Secondary | ICD-10-CM | POA: Diagnosis not present

## 2022-09-02 DIAGNOSIS — G893 Neoplasm related pain (acute) (chronic): Secondary | ICD-10-CM | POA: Diagnosis not present

## 2022-09-02 DIAGNOSIS — C7800 Secondary malignant neoplasm of unspecified lung: Secondary | ICD-10-CM | POA: Diagnosis not present

## 2022-09-02 DIAGNOSIS — C186 Malignant neoplasm of descending colon: Secondary | ICD-10-CM | POA: Diagnosis not present

## 2022-09-02 DIAGNOSIS — K521 Toxic gastroenteritis and colitis: Secondary | ICD-10-CM | POA: Diagnosis not present

## 2022-09-02 DIAGNOSIS — T451X5A Adverse effect of antineoplastic and immunosuppressive drugs, initial encounter: Secondary | ICD-10-CM | POA: Diagnosis not present

## 2022-09-02 DIAGNOSIS — D701 Agranulocytosis secondary to cancer chemotherapy: Secondary | ICD-10-CM | POA: Diagnosis not present

## 2022-09-02 DIAGNOSIS — Z79899 Other long term (current) drug therapy: Secondary | ICD-10-CM | POA: Diagnosis not present

## 2022-09-02 DIAGNOSIS — Z5112 Encounter for antineoplastic immunotherapy: Secondary | ICD-10-CM | POA: Diagnosis not present

## 2022-09-06 ENCOUNTER — Encounter: Payer: Self-pay | Admitting: Family Medicine

## 2022-09-07 DIAGNOSIS — C186 Malignant neoplasm of descending colon: Secondary | ICD-10-CM | POA: Diagnosis not present

## 2022-09-08 ENCOUNTER — Ambulatory Visit (INDEPENDENT_AMBULATORY_CARE_PROVIDER_SITE_OTHER): Payer: BC Managed Care – PPO | Admitting: Family Medicine

## 2022-09-08 ENCOUNTER — Ambulatory Visit (INDEPENDENT_AMBULATORY_CARE_PROVIDER_SITE_OTHER): Payer: BC Managed Care – PPO

## 2022-09-08 ENCOUNTER — Encounter: Payer: Self-pay | Admitting: Family Medicine

## 2022-09-08 VITALS — BP 150/84 | HR 106 | Temp 100.9°F | Resp 18 | Wt 204.0 lb

## 2022-09-08 DIAGNOSIS — R630 Anorexia: Secondary | ICD-10-CM | POA: Diagnosis not present

## 2022-09-08 DIAGNOSIS — R051 Acute cough: Secondary | ICD-10-CM

## 2022-09-08 DIAGNOSIS — R509 Fever, unspecified: Secondary | ICD-10-CM

## 2022-09-08 DIAGNOSIS — C186 Malignant neoplasm of descending colon: Secondary | ICD-10-CM

## 2022-09-08 DIAGNOSIS — R059 Cough, unspecified: Secondary | ICD-10-CM | POA: Diagnosis not present

## 2022-09-08 MED ORDER — AMOXICILLIN-POT CLAVULANATE 500-125 MG PO TABS: 1.0000 | ORAL_TABLET | Freq: Two times a day (BID) | ORAL | 0 refills | Status: AC

## 2022-09-08 MED ORDER — AZITHROMYCIN 250 MG PO TABS: ORAL_TABLET | ORAL | 0 refills | Status: AC

## 2022-09-08 NOTE — Progress Notes (Signed)
   Established Patient Office Visit   Subjective  Patient ID: Dave Vaughn, male    DOB: 1946-02-23  Age: 77 y.o. MRN: [id number]  Chief Complaint  Patient presents with   Cough    Complains of productive cough for about one week. Negative home covid test last Friday 09/03/22.    Patient is a 77 year old male with pmh sig for colon cancer currently being treated who is followed by Dr. Legrand Como and seen for acute concern.  Patient endorses productive cough, fever, decreased appetite x 1 week.  Pt with hoarse voice.  States throat is sore with coughing.  Had chemo treatment and given Fulphila to increase white count.  Patient denies sick contacts.  At home COVID testing negative last Friday.  Cough      Review of Systems  Respiratory:  Positive for cough.    Negative unless stated above    Objective:     BP (!) 150/84 (BP Location: Right Arm, Patient Position: Sitting, Cuff Size: Small)   Pulse (!) 106   Temp (!) 100.9 F (38.3 C) (Oral)   Resp 18   Wt 204 lb (92.5 kg)   SpO2 92%   BMI 29.27 kg/m    Physical Exam Constitutional:      General: He is not in acute distress.    Appearance: Normal appearance. He is ill-appearing.  HENT:     Head: Normocephalic and atraumatic.     Comments: Voice hoarse and weak.    Right Ear: Hearing and tympanic membrane normal.     Left Ear: Hearing and tympanic membrane normal.     Nose: Nose normal.     Mouth/Throat:     Mouth: Mucous membranes are dry.     Pharynx: No posterior oropharyngeal erythema.     Tonsils: No tonsillar exudate.  Cardiovascular:     Rate and Rhythm: Regular rhythm. Tachycardia present.     Heart sounds: Normal heart sounds. No murmur heard.    No gallop.  Pulmonary:     Effort: Pulmonary effort is normal. No respiratory distress.     Breath sounds: Rales present. No wheezing or rhonchi.     Comments: Faint rales in b/l bases.   Skin:    General: Skin is warm and dry.  Neurological:     Mental  Status: He is alert and oriented to person, place, and time.      No results found for any visits on 09/08/22.    Assessment & Plan:  Acute cough -     DG Chest 2 View -     Amoxicillin-Pot Clavulanate; Take 1 tablet by mouth in the morning and at bedtime for 7 days.  Dispense: 14 tablet; Refill: 0  Decreased appetite  Fever, unspecified fever cause  Cancer of left colon  Concern for pneumonia given fever, immunocompromise status chemo for colon cancer..  Start Augmentin and azithromycin.  Obtain CXR.  Hydration encouraged.  Given strict precautions.  Return if symptoms worsen or fail to improve.   Billie Ruddy, MD

## 2022-09-09 ENCOUNTER — Encounter: Payer: BC Managed Care – PPO | Admitting: Family Medicine

## 2022-09-09 ENCOUNTER — Telehealth: Payer: Self-pay | Admitting: Family Medicine

## 2022-09-09 NOTE — Telephone Encounter (Addendum)
Pt was seen by Dr. Volanda Napoleon yesterday, 09/08/22.  MD prescribed the following for acute cough:  amoxicillin-clavulanate (AUGMENTIN) 500-125 MG tablet   azithromycin (ZITHROMAX) 250 MG tablet   Spouse stated that on Pt's chart, it says Pt does not need to take both.   Spouse would like to know which Rx of the 2 does Pt not need to take?

## 2022-09-10 ENCOUNTER — Ambulatory Visit: Payer: BC Managed Care – PPO | Admitting: Family Medicine

## 2022-09-10 NOTE — Telephone Encounter (Signed)
Spoke to Dave Vaughn (on PDR) and inform her of the update. Verbalized understanding.

## 2022-09-10 NOTE — Telephone Encounter (Signed)
Ok to take Augmentin.

## 2022-09-15 ENCOUNTER — Encounter: Payer: Self-pay | Admitting: Gastroenterology

## 2022-09-16 ENCOUNTER — Telehealth: Payer: Self-pay | Admitting: Family Medicine

## 2022-09-16 NOTE — Telephone Encounter (Signed)
Dave Vaughn called back and was informed of the information below.  Appt was scheduled with [name] on 4/12 to arrive at 10:45am.

## 2022-09-16 NOTE — Telephone Encounter (Signed)
I'm not sure -- he has metastatic disease in his lungs and chest x-rays sometimes miss post-obstructive pneumonias- or it could be something as simple as allergies-- best thing to do is to have him come back in and be seen so we can listen to his lungs

## 2022-09-16 NOTE — Telephone Encounter (Signed)
No answer at the wife's cell number.  Spoke with the patient and he complains of a cough with intermittent milky colored-sputum x1 week, chest congestion, denies a fever and stated he is taking a cough medication he does not know the name of.  Message sent to PCP.

## 2022-09-16 NOTE — Telephone Encounter (Signed)
Pt was last seen on 09/10/22.  Spouse called to say Pt has finished his antibiotics and is still coughing.  Spouse states she can still hear the rattling in his chest.  They are wondering if MD will:  Send another round of antibiotics? Send a stronger Rx to the pharmacy? or  Does MD want Pt to come in again for another OV?  Please advise.

## 2022-09-16 NOTE — Telephone Encounter (Signed)
I reviewed the chest cxr and there were no acute pneumonias seen-- is he any better or is he still having fevers?

## 2022-09-16 NOTE — Telephone Encounter (Signed)
Left a message for the patient to return my call.  

## 2022-09-17 ENCOUNTER — Ambulatory Visit (INDEPENDENT_AMBULATORY_CARE_PROVIDER_SITE_OTHER): Payer: BC Managed Care – PPO | Admitting: Adult Health

## 2022-09-17 ENCOUNTER — Encounter: Payer: Self-pay | Admitting: Adult Health

## 2022-09-17 VITALS — BP 110/80 | HR 96 | Temp 98.3°F | Ht 70.0 in | Wt 202.0 lb

## 2022-09-17 DIAGNOSIS — J40 Bronchitis, not specified as acute or chronic: Secondary | ICD-10-CM

## 2022-09-17 MED ORDER — PREDNISONE 10 MG PO TABS: ORAL_TABLET | ORAL | 0 refills | Status: DC

## 2022-09-17 MED ORDER — IPRATROPIUM-ALBUTEROL 0.5-2.5 (3) MG/3ML IN SOLN
3.0000 mL | Freq: Once | RESPIRATORY_TRACT | Status: AC
  Administered 2022-09-17: 3 mL via RESPIRATORY_TRACT

## 2022-09-17 NOTE — Progress Notes (Signed)
Subjective:    Patient ID: Dave Vaughn, male    DOB: Apr 30, 1946, 77 y.o.   MRN: [id number]  Cough Associated symptoms include wheezing.   77 year old male who  has a past medical history of Adenocarcinoma, colon (dx'd 01/2018), Anemia, Anxiety, Atrial fibrillation with RVR, Colonic obstruction (01/10/2018), Depression, Diverticulitis, Dysrhythmia, History of kidney stones, Hypertension, Indwelling Foley catheter present, and Nausea with vomiting (06/11/2022).  He presents to the office today for follow up regarding an acute cough. He was seen 9 days ago in the office by another provider. At this time he endorsed a productive cough, fever, and decreased appetite x 1 week. Had chemo treatment and given Fulphila to increase white count.  There was concern for PNA given his figure. He was started on Augmentin and chest xray ended up showing   IMPRESSION: Scattered bilateral lung nodular densities corresponding to the numerous small pulmonary metastases seen on recent 07/14/2022 CT.  Today he reports that he continues to have a productive cough with " milky dirty gray color" and fatigue. He feels like something is sitting on his chest. He has not had any recurrence of fevers     Review of Systems  Respiratory:  Positive for cough, chest tightness and wheezing.    See HPI   Past Medical History:  Diagnosis Date   Adenocarcinoma, colon dx'd 01/2018   Anemia    taking iron supplements   Anxiety    Atrial fibrillation with RVR    Colonic obstruction 01/10/2018   Depression    Diverticulitis    Dysrhythmia    afib   History of kidney stones    Hypertension    Indwelling Foley catheter present    due to UTI   Nausea with vomiting 06/11/2022    Social History   Socioeconomic History   Marital status: Married    Spouse name: Not on file   Number of children: Not on file   Years of education: Not on file   Highest education level: Bachelor's degree (e.g., BA, AB, BS)   Occupational History   Not on file  Tobacco Use   Smoking status: Never   Smokeless tobacco: Former    Types: Chew    Quit date: 10/22/2019  Vaping Use   Vaping Use: Never used  Substance and Sexual Activity   Alcohol use: Yes    Alcohol/week: 2.0 standard drinks of alcohol    Types: 1 Cans of beer, 1 Shots of liquor per week    Comment: sometimes   Drug use: Never   Sexual activity: Not on file  Other Topics Concern   Not on file  Social History Narrative   Not on file   Social Determinants of Health   Financial Resource Strain: Low Risk  (07/06/2022)   Overall Financial Resource Strain (CARDIA)    Difficulty of Paying Living Expenses: Not hard at all  Food Insecurity: No Food Insecurity (07/06/2022)   Hunger Vital Sign    Worried About Running Out of Food in the Last Year: Never true    Ran Out of Food in the Last Year: Never true  Transportation Needs: No Transportation Needs (07/06/2022)   PRAPARE - Administrator, Civil Service (Medical): No    Lack of Transportation (Non-Medical): No  Physical Activity: Insufficiently Active (07/06/2022)   Exercise Vital Sign    Days of Exercise per Week: 2 days    Minutes of Exercise per Session: 30 min  Stress: Stress  Concern Present (07/06/2022)   [employer] of Occupational Health - Occupational Stress Questionnaire    Feeling of Stress : To some extent  Social Connections: Socially Integrated (07/06/2022)   Social Connection and Isolation Panel [NHANES]    Frequency of Communication with Friends and Family: Twice a week    Frequency of Social Gatherings with Friends and Family: Twice a week    Attends Religious Services: More than 4 times per year    Active Member of Clubs or Organizations: Yes    Attends Banker Meetings: 1 to 4 times per year    Marital Status: Married  Catering manager Violence: Not on file    Past Surgical History:  Procedure Laterality Date   ANKLE SURGERY Left    when he  was in college   COLON RESECTION N/A 01/11/2018   Procedure: LEFT COLON RESECTION, TAKEDOWN SPLENIC FLEXURE, COLOSTOMY;  Surgeon: Claud Kelp, MD;  Location: [facility];  Service: General;  Laterality: N/A;   COLONOSCOPY  01/11/2018   Procedure: COLONOSCOPY;  Surgeon: Lynann Bologna, MD;  Location: [facility];  Service: Endoscopy;;   COLONOSCOPY  05/10/2018   colonscopy  05/10/2018   EXCISION OF KELOID N/A 05/05/2021   Procedure: ABDOMINAL SCAR EXCISION;  Surgeon: Axel Filler, MD;  Location: [facility] OR;  Service: General;  Laterality: N/A;   FOREIGN BODY REMOVAL ABDOMINAL N/A 03/09/2021   Procedure: EXCISION OF FOREIGN BODY;  Surgeon: Axel Filler, MD;  Location: [facility] OR;  Service: General;  Laterality: N/A;   HERNIA REPAIR     HERNIA REPAIR  03/02/2019   EXPLORATORY LAPAROTOMY (N/A Abdomen)   INCISIONAL HERNIA REPAIR N/A 03/02/2019   Procedure: INCISIONAL HERNIA REPAIR , RECTORECTUS VS TAR HERNIA REPAIR;  Surgeon: Axel Filler, MD;  Location: [facility] OR;  Service: General;  Laterality: N/A;   INSERTION OF MESH N/A 03/02/2019   Procedure: Insertion Of Mesh;  Surgeon: Axel Filler, MD;  Location: [facility] OR;  Service: General;  Laterality: N/A;   LAPAROTOMY N/A 03/02/2019   Procedure: EXPLORATORY LAPAROTOMY;  Surgeon: Axel Filler, MD;  Location: [facility] OR;  Service: General;  Laterality: N/A;   LAPAROTOMY N/A 03/09/2021   Procedure: EXPLORATION OF ABDOMINAL WALL;  Surgeon: Axel Filler, MD;  Location: [facility] OR;  Service: General;  Laterality: N/A;   LYSIS OF ADHESION N/A 06/12/2018   Procedure: LYSIS OF ADHESIONS;  Surgeon: Karie Soda, MD;  Location: [facility];  Service: General;  Laterality: N/A;   LYSIS OF ADHESION N/A 03/02/2019   Procedure: Lysis Of Adhesion;  Surgeon: Axel Filler, MD;  Location: [facility] OR;  Service: General;  Laterality: N/A;   PORTACATH PLACEMENT Right 03/07/2018   Procedure: INSERTION PORT-A-CATH RIGHT SUBCLAVIAN;  Surgeon: Claud Kelp, MD;  Location: [facility] OR;  Service: General;   Laterality: Right;   PROCTOSCOPY N/A 06/12/2018   Procedure: RIGID PROCTOSCOPY;  Surgeon: Karie Soda, MD;  Location: [facility];  Service: General;  Laterality: N/A;   thumb surgery   2018   cyst removal    Family History  Problem Relation Age of Onset   Breast cancer Mother 39       metastatin; recurrence x7   Heart attack Father 67   Cancer Daughter        melanoma, leukemia   Esophageal cancer Neg Hx    Colon cancer Neg Hx    Rectal cancer Neg Hx    Ulcerative colitis Neg Hx     No Known Allergies  Current Outpatient Medications on File Prior to Visit  Medication Sig Dispense Refill   apixaban (ELIQUIS) 5 MG TABS tablet TAKE 1 TABLET(5 MG) BY MOUTH TWICE DAILY 60 tablet 5   b complex vitamins tablet Take 1 tablet by mouth daily.     dexamethasone (DECADRON) 4 MG tablet Take 2 tablets (8 mg total) by mouth daily. Start the day after chemotherapy for 2 days. Take with food. 8 tablet 1   diltiazem (CARDIZEM CD) 360 MG 24 hr capsule TAKE 1 CAPSULE(360 MG) BY MOUTH DAILY 90 capsule 0   diphenoxylate-atropine (LOMOTIL) 2.5-0.025 MG tablet Take 1-2 tablets by mouth 4 (four) times daily as needed for diarrhea or loose stools. 60 tablet 1   eszopiclone (LUNESTA) 1 MG TABS tablet Take 1 tablet (1 mg total) by mouth at bedtime as needed for sleep. Take immediately before bedtime 30 tablet 2   finasteride (PROSCAR) 5 MG tablet Take 5 mg by mouth daily.     lidocaine-prilocaine (EMLA) cream Apply 1 application topically as needed. 30 g 1   Multiple Vitamins-Iron (MULTIVITAMIN/IRON PO) Take 1 tablet by mouth daily.      ondansetron (ZOFRAN) 8 MG tablet Take 1 tablet (8 mg total) by mouth every 8 (eight) hours as needed for nausea, vomiting or refractory nausea / vomiting. Start on the third day after chemotherapy. 30 tablet 1   Oxycodone HCl 10 MG TABS Take 1 tablet (10 mg total) by mouth daily as needed. 60 tablet 0   prochlorperazine (COMPAZINE) 10 MG tablet Take 1 tablet (10 mg total) by  mouth every 6 (six) hours as needed for nausea or vomiting. 30 tablet 1   rosuvastatin (CRESTOR) 5 MG tablet Take 1 tablet (5 mg total) by mouth daily. 90 tablet 3   tamsulosin (FLOMAX) 0.4 MG CAPS capsule Take 0.4 mg by mouth 2 (two) times daily.      No current facility-administered medications on file prior to visit.    BP 110/80   Pulse 72   Temp 98.3 F (36.8 C) (Oral)   Ht  (1.778 m)   Wt 202 lb (91.6 kg)   SpO2 90%   BMI 28.98 kg/m       Objective:   Physical Exam Vitals and nursing note reviewed.  Constitutional:      Appearance: Normal appearance.  Cardiovascular:     Rate and Rhythm: Normal rate and regular rhythm.     Pulses: Normal pulses.     Heart sounds: Normal heart sounds.  Pulmonary:     Effort: Pulmonary effort is normal.     Breath sounds: Wheezing present.  Musculoskeletal:        General: Normal range of motion.  Skin:    General: Skin is warm and dry.     Capillary Refill: Capillary refill takes less than 2 seconds.  Neurological:     General: No focal deficit present.     Mental Status: He is alert and oriented to person, place, and time.  Psychiatric:        Mood and Affect: Mood normal.        Behavior: Behavior normal.        Thought Content: Thought content normal.       Assessment & Plan:  1. Bronchitis - ipratropium-albuterol (DUONEB) 0.5-2.5 (3) MG/3ML nebulizer solution 3 mL - predniSONE (DELTASONE) 10 MG tablet; 40 mg x 3 days, 20 mg x 3 days, 10 mg x 3 days  Dispense: 21 tablet; Refill: 0  Patient given duone in the office today. After duoneb  his wheezing had resolved and he felt as though he could breath easier. His pulse ox improved for 90% to 96%. Lungs clear after duoneb. He tolerated duoneb well   Shirline Frees, NP

## 2022-09-28 ENCOUNTER — Telehealth: Payer: Self-pay | Admitting: Family Medicine

## 2022-09-28 DIAGNOSIS — J40 Bronchitis, not specified as acute or chronic: Secondary | ICD-10-CM

## 2022-09-28 NOTE — Telephone Encounter (Signed)
Patient still feels tightness in chest, not coughing as much. Asking is there an inhaler that would help. Saw Nafziger 09/17/22

## 2022-09-29 MED ORDER — ALBUTEROL SULFATE HFA 108 (90 BASE) MCG/ACT IN AERS: 2.0000 | INHALATION_SPRAY | Freq: Four times a day (QID) | RESPIRATORY_TRACT | 2 refills | Status: DC | PRN

## 2022-09-29 NOTE — Telephone Encounter (Signed)
We could try one but I'm very nervous about his symptoms. I called in an albuterol inhaler for him but I'm worried that because his symptoms have gone on for so long that there could be something else going on. He can try the inhaler but if it doesn't work then he needs to be seen either by me or his oncologist-- maybe will need another CT chest.

## 2022-09-29 NOTE — Addendum Note (Signed)
Addended by: Karie Georges on: 09/29/2022 08:50 AM   Modules accepted: Orders

## 2022-09-29 NOTE — Telephone Encounter (Signed)
Left a detailed message with the information below at the patient's cell number. ?

## 2022-09-30 DIAGNOSIS — G893 Neoplasm related pain (acute) (chronic): Secondary | ICD-10-CM | POA: Diagnosis not present

## 2022-09-30 DIAGNOSIS — K521 Toxic gastroenteritis and colitis: Secondary | ICD-10-CM | POA: Diagnosis not present

## 2022-09-30 DIAGNOSIS — C7800 Secondary malignant neoplasm of unspecified lung: Secondary | ICD-10-CM | POA: Diagnosis not present

## 2022-09-30 DIAGNOSIS — C786 Secondary malignant neoplasm of retroperitoneum and peritoneum: Secondary | ICD-10-CM | POA: Diagnosis not present

## 2022-09-30 DIAGNOSIS — D759 Disease of blood and blood-forming organs, unspecified: Secondary | ICD-10-CM | POA: Diagnosis not present

## 2022-09-30 DIAGNOSIS — C186 Malignant neoplasm of descending colon: Secondary | ICD-10-CM | POA: Diagnosis not present

## 2022-09-30 DIAGNOSIS — C787 Secondary malignant neoplasm of liver and intrahepatic bile duct: Secondary | ICD-10-CM | POA: Diagnosis not present

## 2022-09-30 DIAGNOSIS — Z5112 Encounter for antineoplastic immunotherapy: Secondary | ICD-10-CM | POA: Diagnosis not present

## 2022-09-30 DIAGNOSIS — Z79899 Other long term (current) drug therapy: Secondary | ICD-10-CM | POA: Diagnosis not present

## 2022-09-30 DIAGNOSIS — Z5111 Encounter for antineoplastic chemotherapy: Secondary | ICD-10-CM | POA: Diagnosis not present

## 2022-10-14 DIAGNOSIS — C787 Secondary malignant neoplasm of liver and intrahepatic bile duct: Secondary | ICD-10-CM | POA: Diagnosis not present

## 2022-10-14 DIAGNOSIS — Z5112 Encounter for antineoplastic immunotherapy: Secondary | ICD-10-CM | POA: Diagnosis not present

## 2022-10-14 DIAGNOSIS — C7801 Secondary malignant neoplasm of right lung: Secondary | ICD-10-CM | POA: Diagnosis not present

## 2022-10-14 DIAGNOSIS — K521 Toxic gastroenteritis and colitis: Secondary | ICD-10-CM | POA: Diagnosis not present

## 2022-10-14 DIAGNOSIS — G893 Neoplasm related pain (acute) (chronic): Secondary | ICD-10-CM | POA: Diagnosis not present

## 2022-10-14 DIAGNOSIS — Z79899 Other long term (current) drug therapy: Secondary | ICD-10-CM | POA: Diagnosis not present

## 2022-10-14 DIAGNOSIS — Z5111 Encounter for antineoplastic chemotherapy: Secondary | ICD-10-CM | POA: Diagnosis not present

## 2022-10-14 DIAGNOSIS — C186 Malignant neoplasm of descending colon: Secondary | ICD-10-CM | POA: Diagnosis not present

## 2022-11-11 DIAGNOSIS — K521 Toxic gastroenteritis and colitis: Secondary | ICD-10-CM | POA: Diagnosis not present

## 2022-11-11 DIAGNOSIS — C186 Malignant neoplasm of descending colon: Secondary | ICD-10-CM | POA: Diagnosis not present

## 2022-11-11 DIAGNOSIS — Z5112 Encounter for antineoplastic immunotherapy: Secondary | ICD-10-CM | POA: Diagnosis not present

## 2022-11-11 DIAGNOSIS — G893 Neoplasm related pain (acute) (chronic): Secondary | ICD-10-CM | POA: Diagnosis not present

## 2022-11-11 DIAGNOSIS — C786 Secondary malignant neoplasm of retroperitoneum and peritoneum: Secondary | ICD-10-CM | POA: Diagnosis not present

## 2022-11-11 DIAGNOSIS — C7800 Secondary malignant neoplasm of unspecified lung: Secondary | ICD-10-CM | POA: Diagnosis not present

## 2022-11-11 DIAGNOSIS — J219 Acute bronchiolitis, unspecified: Secondary | ICD-10-CM | POA: Diagnosis not present

## 2022-11-11 DIAGNOSIS — C7801 Secondary malignant neoplasm of right lung: Secondary | ICD-10-CM | POA: Diagnosis not present

## 2022-11-11 DIAGNOSIS — C7802 Secondary malignant neoplasm of left lung: Secondary | ICD-10-CM | POA: Diagnosis not present

## 2022-11-11 DIAGNOSIS — Z5111 Encounter for antineoplastic chemotherapy: Secondary | ICD-10-CM | POA: Diagnosis not present

## 2022-11-11 DIAGNOSIS — C787 Secondary malignant neoplasm of liver and intrahepatic bile duct: Secondary | ICD-10-CM | POA: Diagnosis not present

## 2022-11-23 ENCOUNTER — Encounter: Payer: Self-pay | Admitting: Hematology

## 2022-11-24 ENCOUNTER — Ambulatory Visit: Payer: BC Managed Care – PPO | Admitting: Nurse Practitioner

## 2022-11-29 ENCOUNTER — Other Ambulatory Visit: Payer: Self-pay | Admitting: Cardiology

## 2022-12-02 ENCOUNTER — Other Ambulatory Visit: Payer: Self-pay

## 2022-12-02 ENCOUNTER — Encounter: Payer: Self-pay | Admitting: Hematology

## 2022-12-02 MED ORDER — DILTIAZEM HCL ER COATED BEADS 360 MG PO CP24: ORAL_CAPSULE | ORAL | 0 refills | Status: DC

## 2022-12-13 ENCOUNTER — Ambulatory Visit: Payer: BC Managed Care – PPO | Attending: Cardiology | Admitting: Cardiology

## 2022-12-13 ENCOUNTER — Encounter: Payer: Self-pay | Admitting: Cardiology

## 2022-12-13 VITALS — BP 116/64 | HR 68 | Ht 70.0 in | Wt 217.8 lb

## 2022-12-13 DIAGNOSIS — Z7901 Long term (current) use of anticoagulants: Secondary | ICD-10-CM | POA: Diagnosis not present

## 2022-12-13 DIAGNOSIS — C186 Malignant neoplasm of descending colon: Secondary | ICD-10-CM

## 2022-12-13 DIAGNOSIS — I4821 Permanent atrial fibrillation: Secondary | ICD-10-CM | POA: Diagnosis not present

## 2022-12-13 NOTE — Progress Notes (Signed)
  Cardiology Office Note:  .   Date:  12/13/2022  ID:  Dave Vaughn, DOB 06/18/1945, MRN [id number] PCP: Karie Georges, MD  [facility] Providers Cardiologist:  Donato Schultz, MD    History of Present Illness: .   Dave Vaughn is a 77 y.o. male here for the follow-up of longstanding persistent asymptomatic atrial fibrillation on chronic anticoagulation with chronic kidney disease stage IIIa, hypothyroidism, left-sided colon cancer, DNR.  Previously admitted with strangulated hernia post repair.  Dr. Mosetta Putt for adenocarcinoma left colon.  Prior hospitalization on 07/14/2022.  Hepatic metastasis.  Incurable nature of cancer.  Palliation.  He states that he actually feels great.  Has lower extremity edema when standing for periods of time.  Elevates his leg and it does go away.  Previously Eliquis was on hold secondary to prior surgery.   ROS: No bleeding no chest pain no shortness of breath  Studies Reviewed: [name]   EKG Interpretation Date/Time:  Monday December 13 2022 09:34:41 EDT Ventricular Rate:  76 PR Interval:    QRS Duration:  82 QT Interval:  376 QTC Calculation: 423 R Axis:   -15  Text Interpretation: Atrial fibrillation Low voltage QRS When compared with ECG of 02-Mar-2019 06:13, No significant change since last tracing Confirmed by Donato Schultz ([id number]) on 12/13/2022 9:41:24 AM   Prior EKG atrial fibrillation 68 bpm Echocardiogram-EF 60% with moderate mitral regurgitation moderately dilated left atrium  Risk Assessment/Calculations:            Physical Exam:   VS:  BP 116/64   Pulse 68   Ht 5\' 10"  (1.778 m)   Wt 217 lb 12.8 oz (98.8 kg)   SpO2 96%   BMI 31.25 kg/m    Wt Readings from Last 3 Encounters:  12/13/22 217 lb 12.8 oz (98.8 kg)  09/17/22 202 lb (91.6 kg)  09/08/22 204 lb (92.5 kg)    GEN: Well nourished, well developed in no acute distress NECK: No JVD; No carotid bruits CARDIAC: Irregularly irregular normal rate, no murmurs, rubs,  gallops RESPIRATORY:  Clear to auscultation without rales, wheezing or rhonchi  ABDOMEN: Soft, non-tender, non-distended EXTREMITIES:  2+ BLE edema; No deformity   ASSESSMENT AND PLAN: .   Longstanding persistent atrial fibrillation - Doing well with Eliquis 5 mg twice a day for chronic anticoagulation on diltiazem 360 mg long-acting daily.  Most recent hemoglobin 12.9, creatinine 1.0 TSH 5.29.  Obviously if bleeding occurs, need to stop Eliquis.  So far so good.  Hyperlipidemia - On 5 mg of rosuvastatin daily.  LDL 129 in March or 2024.  Chronic kidney disease stage IIIa - Primary team monitoring.  Creatinine excellent now 1.0.  Previously 1.4.  Avoiding NSAIDs.  Cancer of left colon - Dr. Mosetta Putt.  Oncology notes reviewed.      Dispo: 1 year  Signed, Donato Schultz, MD

## 2022-12-13 NOTE — Patient Instructions (Signed)
Medication Instructions:  The current medical regimen is effective;  continue present plan and medications.  *If you need a refill on your cardiac medications before your next appointment, please call your pharmacy*   Follow-Up: At [facility], you and your health needs are our priority.  As part of our continuing mission to provide you with exceptional heart care, we have created designated Provider Care Teams.  These Care Teams include your primary Cardiologist (physician) and Advanced Practice Providers (APPs -  Physician Assistants and Nurse Practitioners) who all work together to provide you with the care you need, when you need it.  We recommend signing up for the patient portal called "MyChart".  Sign up information is provided on this After Visit Summary.  MyChart is used to connect with patients for Virtual Visits (Telemedicine).  Patients are able to view lab/test results, encounter notes, upcoming appointments, etc.  Non-urgent messages can be sent to your provider as well.   To learn more about what you can do with MyChart, go to [email or web address].    Your next appointment:   1 year(s)  Provider:   Dr Mark Skains      

## 2022-12-16 DIAGNOSIS — G893 Neoplasm related pain (acute) (chronic): Secondary | ICD-10-CM | POA: Diagnosis not present

## 2022-12-16 DIAGNOSIS — C786 Secondary malignant neoplasm of retroperitoneum and peritoneum: Secondary | ICD-10-CM | POA: Diagnosis not present

## 2022-12-16 DIAGNOSIS — C19 Malignant neoplasm of rectosigmoid junction: Secondary | ICD-10-CM | POA: Diagnosis not present

## 2022-12-16 DIAGNOSIS — Z79899 Other long term (current) drug therapy: Secondary | ICD-10-CM | POA: Diagnosis not present

## 2022-12-16 DIAGNOSIS — C7800 Secondary malignant neoplasm of unspecified lung: Secondary | ICD-10-CM | POA: Diagnosis not present

## 2022-12-16 DIAGNOSIS — C186 Malignant neoplasm of descending colon: Secondary | ICD-10-CM | POA: Diagnosis not present

## 2022-12-16 DIAGNOSIS — C787 Secondary malignant neoplasm of liver and intrahepatic bile duct: Secondary | ICD-10-CM | POA: Diagnosis not present

## 2022-12-16 DIAGNOSIS — K521 Toxic gastroenteritis and colitis: Secondary | ICD-10-CM | POA: Diagnosis not present

## 2022-12-16 DIAGNOSIS — Z5111 Encounter for antineoplastic chemotherapy: Secondary | ICD-10-CM | POA: Diagnosis not present

## 2022-12-16 DIAGNOSIS — Z5112 Encounter for antineoplastic immunotherapy: Secondary | ICD-10-CM | POA: Diagnosis not present

## 2022-12-28 ENCOUNTER — Ambulatory Visit: Payer: BC Managed Care – PPO | Admitting: Gastroenterology

## 2022-12-30 DIAGNOSIS — C787 Secondary malignant neoplasm of liver and intrahepatic bile duct: Secondary | ICD-10-CM | POA: Diagnosis not present

## 2022-12-30 DIAGNOSIS — K521 Toxic gastroenteritis and colitis: Secondary | ICD-10-CM | POA: Diagnosis not present

## 2022-12-30 DIAGNOSIS — C786 Secondary malignant neoplasm of retroperitoneum and peritoneum: Secondary | ICD-10-CM | POA: Diagnosis not present

## 2022-12-30 DIAGNOSIS — Z79899 Other long term (current) drug therapy: Secondary | ICD-10-CM | POA: Diagnosis not present

## 2022-12-30 DIAGNOSIS — Z5111 Encounter for antineoplastic chemotherapy: Secondary | ICD-10-CM | POA: Diagnosis not present

## 2022-12-30 DIAGNOSIS — C186 Malignant neoplasm of descending colon: Secondary | ICD-10-CM | POA: Diagnosis not present

## 2022-12-30 DIAGNOSIS — Z5112 Encounter for antineoplastic immunotherapy: Secondary | ICD-10-CM | POA: Diagnosis not present

## 2022-12-30 DIAGNOSIS — C19 Malignant neoplasm of rectosigmoid junction: Secondary | ICD-10-CM | POA: Diagnosis not present

## 2022-12-30 DIAGNOSIS — G893 Neoplasm related pain (acute) (chronic): Secondary | ICD-10-CM | POA: Diagnosis not present

## 2023-01-20 ENCOUNTER — Encounter (INDEPENDENT_AMBULATORY_CARE_PROVIDER_SITE_OTHER): Payer: Self-pay

## 2023-01-20 DIAGNOSIS — Z5111 Encounter for antineoplastic chemotherapy: Secondary | ICD-10-CM | POA: Diagnosis not present

## 2023-01-20 DIAGNOSIS — C787 Secondary malignant neoplasm of liver and intrahepatic bile duct: Secondary | ICD-10-CM | POA: Diagnosis not present

## 2023-01-20 DIAGNOSIS — K521 Toxic gastroenteritis and colitis: Secondary | ICD-10-CM | POA: Diagnosis not present

## 2023-01-20 DIAGNOSIS — C7800 Secondary malignant neoplasm of unspecified lung: Secondary | ICD-10-CM | POA: Diagnosis not present

## 2023-01-20 DIAGNOSIS — C19 Malignant neoplasm of rectosigmoid junction: Secondary | ICD-10-CM | POA: Diagnosis not present

## 2023-01-20 DIAGNOSIS — G893 Neoplasm related pain (acute) (chronic): Secondary | ICD-10-CM | POA: Diagnosis not present

## 2023-01-20 DIAGNOSIS — R600 Localized edema: Secondary | ICD-10-CM | POA: Diagnosis not present

## 2023-01-20 DIAGNOSIS — C786 Secondary malignant neoplasm of retroperitoneum and peritoneum: Secondary | ICD-10-CM | POA: Diagnosis not present

## 2023-01-20 DIAGNOSIS — C186 Malignant neoplasm of descending colon: Secondary | ICD-10-CM | POA: Diagnosis not present

## 2023-01-20 DIAGNOSIS — Z5112 Encounter for antineoplastic immunotherapy: Secondary | ICD-10-CM | POA: Diagnosis not present

## 2023-01-20 DIAGNOSIS — Z79899 Other long term (current) drug therapy: Secondary | ICD-10-CM | POA: Diagnosis not present

## 2023-01-30 ENCOUNTER — Other Ambulatory Visit: Payer: Self-pay | Admitting: Cardiology

## 2023-01-30 DIAGNOSIS — I482 Chronic atrial fibrillation, unspecified: Secondary | ICD-10-CM

## 2023-01-31 NOTE — Telephone Encounter (Signed)
Prescription refill request for Eliquis received. Indication:afib Last office visit:7/24 Scr:1.1  8/24 Age: 77 Weight:98.8  kg  Prescription refilled

## 2023-02-01 DIAGNOSIS — E860 Dehydration: Secondary | ICD-10-CM | POA: Diagnosis not present

## 2023-02-01 DIAGNOSIS — R6521 Severe sepsis with septic shock: Secondary | ICD-10-CM | POA: Diagnosis not present

## 2023-02-01 DIAGNOSIS — J690 Pneumonitis due to inhalation of food and vomit: Secondary | ICD-10-CM | POA: Diagnosis not present

## 2023-02-01 DIAGNOSIS — K631 Perforation of intestine (nontraumatic): Secondary | ICD-10-CM | POA: Diagnosis not present

## 2023-02-01 DIAGNOSIS — N183 Chronic kidney disease, stage 3 unspecified: Secondary | ICD-10-CM | POA: Diagnosis not present

## 2023-02-01 DIAGNOSIS — R410 Disorientation, unspecified: Secondary | ICD-10-CM | POA: Diagnosis not present

## 2023-02-01 DIAGNOSIS — S0081XA Abrasion of other part of head, initial encounter: Secondary | ICD-10-CM | POA: Diagnosis not present

## 2023-02-01 DIAGNOSIS — Z66 Do not resuscitate: Secondary | ICD-10-CM | POA: Diagnosis not present

## 2023-02-01 DIAGNOSIS — N179 Acute kidney failure, unspecified: Secondary | ICD-10-CM | POA: Diagnosis not present

## 2023-02-01 DIAGNOSIS — J9690 Respiratory failure, unspecified, unspecified whether with hypoxia or hypercapnia: Secondary | ICD-10-CM | POA: Diagnosis not present

## 2023-02-01 DIAGNOSIS — R569 Unspecified convulsions: Secondary | ICD-10-CM | POA: Diagnosis not present

## 2023-02-01 DIAGNOSIS — S0990XA Unspecified injury of head, initial encounter: Secondary | ICD-10-CM | POA: Diagnosis not present

## 2023-02-01 DIAGNOSIS — I959 Hypotension, unspecified: Secondary | ICD-10-CM | POA: Diagnosis not present

## 2023-02-01 DIAGNOSIS — C186 Malignant neoplasm of descending colon: Secondary | ICD-10-CM | POA: Diagnosis not present

## 2023-02-01 DIAGNOSIS — C7800 Secondary malignant neoplasm of unspecified lung: Secondary | ICD-10-CM | POA: Diagnosis not present

## 2023-02-01 DIAGNOSIS — K56609 Unspecified intestinal obstruction, unspecified as to partial versus complete obstruction: Secondary | ICD-10-CM | POA: Diagnosis not present

## 2023-02-01 DIAGNOSIS — C7802 Secondary malignant neoplasm of left lung: Secondary | ICD-10-CM | POA: Diagnosis not present

## 2023-02-01 DIAGNOSIS — W19XXXA Unspecified fall, initial encounter: Secondary | ICD-10-CM | POA: Diagnosis not present

## 2023-02-01 DIAGNOSIS — S199XXA Unspecified injury of neck, initial encounter: Secondary | ICD-10-CM | POA: Diagnosis not present

## 2023-02-01 DIAGNOSIS — Z515 Encounter for palliative care: Secondary | ICD-10-CM | POA: Diagnosis not present

## 2023-02-01 DIAGNOSIS — S299XXA Unspecified injury of thorax, initial encounter: Secondary | ICD-10-CM | POA: Diagnosis not present

## 2023-02-01 DIAGNOSIS — R0600 Dyspnea, unspecified: Secondary | ICD-10-CM | POA: Diagnosis not present

## 2023-02-01 DIAGNOSIS — R9431 Abnormal electrocardiogram [ECG] [EKG]: Secondary | ICD-10-CM | POA: Diagnosis not present

## 2023-02-01 DIAGNOSIS — R918 Other nonspecific abnormal finding of lung field: Secondary | ICD-10-CM | POA: Diagnosis not present

## 2023-02-01 DIAGNOSIS — S3991XA Unspecified injury of abdomen, initial encounter: Secondary | ICD-10-CM | POA: Diagnosis not present

## 2023-02-01 DIAGNOSIS — C7801 Secondary malignant neoplasm of right lung: Secondary | ICD-10-CM | POA: Diagnosis not present

## 2023-02-01 DIAGNOSIS — K55069 Acute infarction of intestine, part and extent unspecified: Secondary | ICD-10-CM | POA: Diagnosis not present

## 2023-02-01 DIAGNOSIS — A419 Sepsis, unspecified organism: Secondary | ICD-10-CM | POA: Diagnosis not present

## 2023-02-01 DIAGNOSIS — C786 Secondary malignant neoplasm of retroperitoneum and peritoneum: Secondary | ICD-10-CM | POA: Diagnosis not present

## 2023-02-01 DIAGNOSIS — C189 Malignant neoplasm of colon, unspecified: Secondary | ICD-10-CM | POA: Diagnosis not present

## 2023-02-01 DIAGNOSIS — C787 Secondary malignant neoplasm of liver and intrahepatic bile duct: Secondary | ICD-10-CM | POA: Diagnosis not present

## 2023-02-01 DIAGNOSIS — S3993XA Unspecified injury of pelvis, initial encounter: Secondary | ICD-10-CM | POA: Diagnosis not present

## 2023-02-01 DIAGNOSIS — R112 Nausea with vomiting, unspecified: Secondary | ICD-10-CM | POA: Diagnosis not present

## 2023-02-01 DIAGNOSIS — I4891 Unspecified atrial fibrillation: Secondary | ICD-10-CM | POA: Diagnosis not present

## 2023-02-06 DEATH — deceased

## 2023-02-09 ENCOUNTER — Ambulatory Visit: Payer: BC Managed Care – PPO | Admitting: Family Medicine

## 2023-03-11 ENCOUNTER — Other Ambulatory Visit: Payer: Self-pay

## 2023-04-27 ENCOUNTER — Encounter: Payer: Self-pay | Admitting: Hematology

## 2023-04-27 NOTE — Telephone Encounter (Signed)
Telephone call  

## 2023-10-14 IMAGING — CT CT CHEST-ABD-PELV W/ CM
2 of 5 series · 12 of 36 positions shown, 14 images · IV contrast (omnipaque)
Comparison: Prior CTs 04/06/2021 and 01/29/2021.

CLINICAL DATA: Colorectal cancer, assess treatment response. Cancer
of the left colon treated surgically in 9726. Abdominal scar
excision 05/05/2021.

EXAM:
CT CHEST, ABDOMEN, AND PELVIS WITH CONTRAST
TECHNIQUE: Multidetector CT imaging of the chest, abdomen and pelvis was
performed following the standard protocol during bolus
administration of intravenous contrast.
CONTRAST:  100mL OMNIPAQUE IOHEXOL 350 MG/ML SOLN

[Series 2: cap with · axial · 0.98mm/px · z∈[-650,-90]mm · 9 of 142 slices shown, 11 images]
[im 15/142  mediastinal]
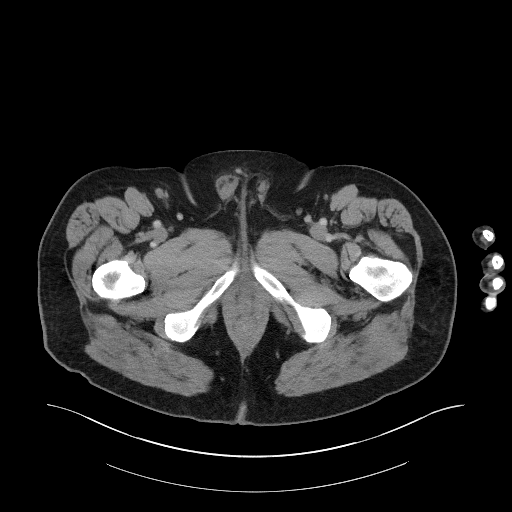
[im 15/142  bone]
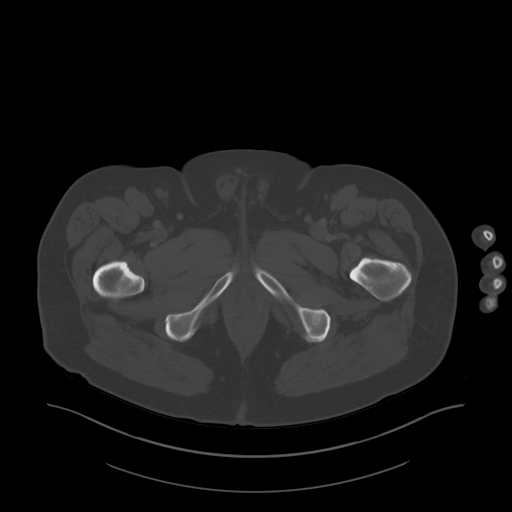
[im 29/142  mediastinal]
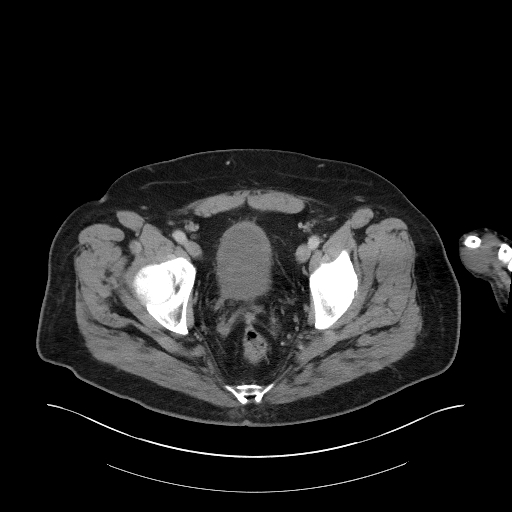
[im 43/142  mediastinal]
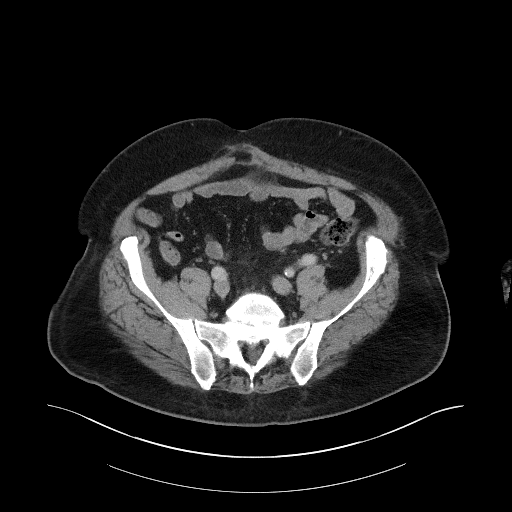
[im 57/142  mediastinal]
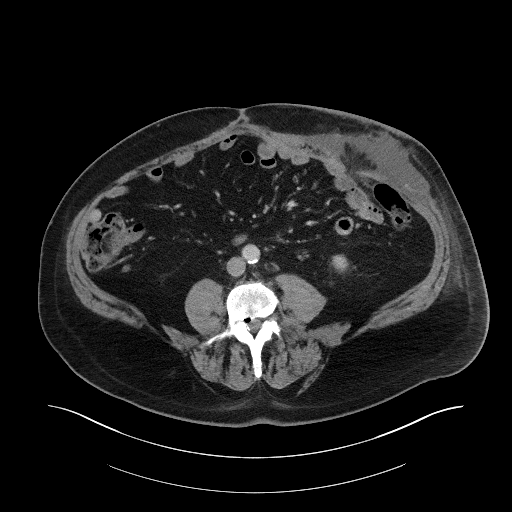
[im 71/142  mediastinal]
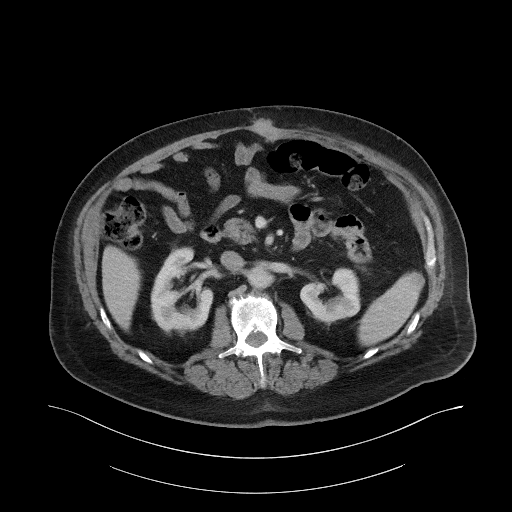
[im 85/142  mediastinal]
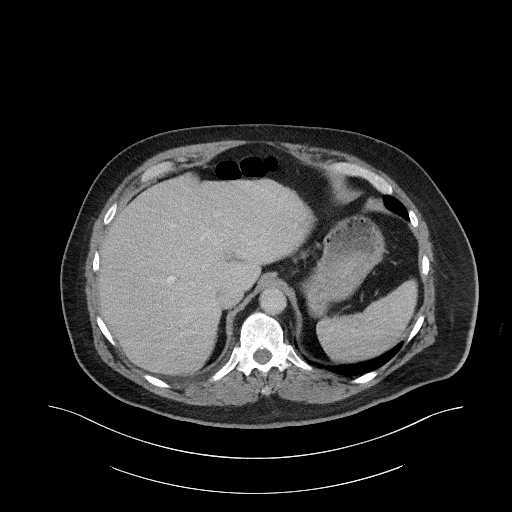
[im 99/142  mediastinal]
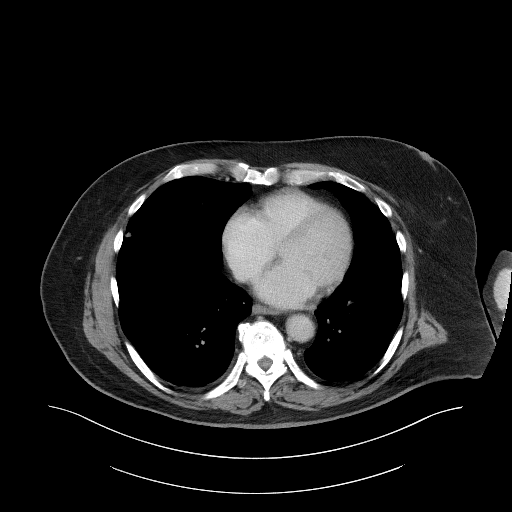
[im 113/142  mediastinal]
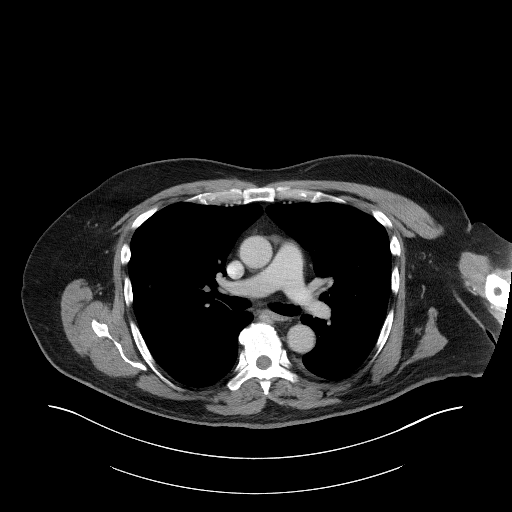
[im 127/142  mediastinal]
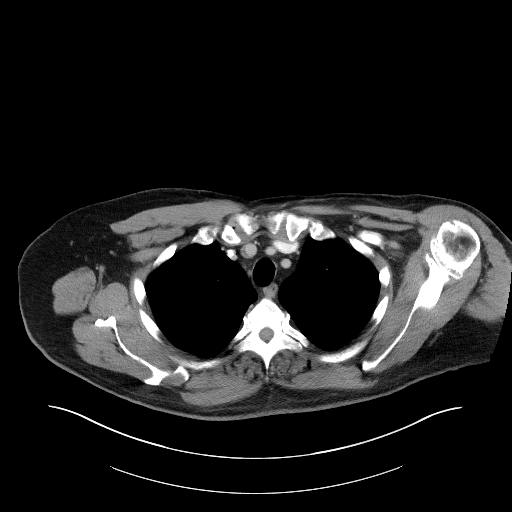
[im 127/142  bone]
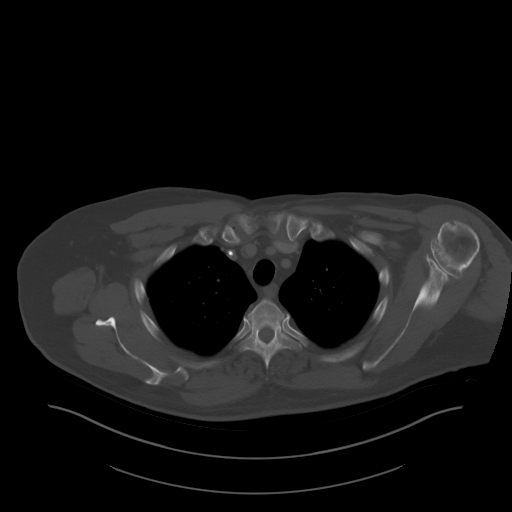

[Series 4: coronals · coronal · 0.98mm/px · 3 of 162 slices shown]
[im 33/162  mediastinal]
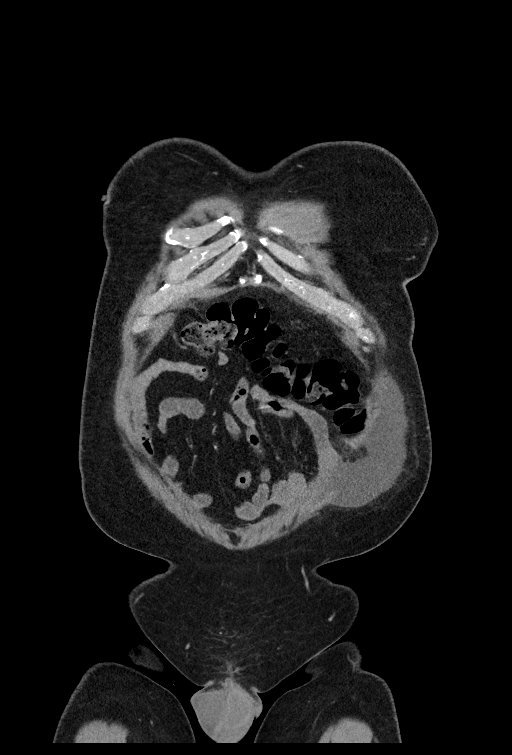
[im 65/162  mediastinal]
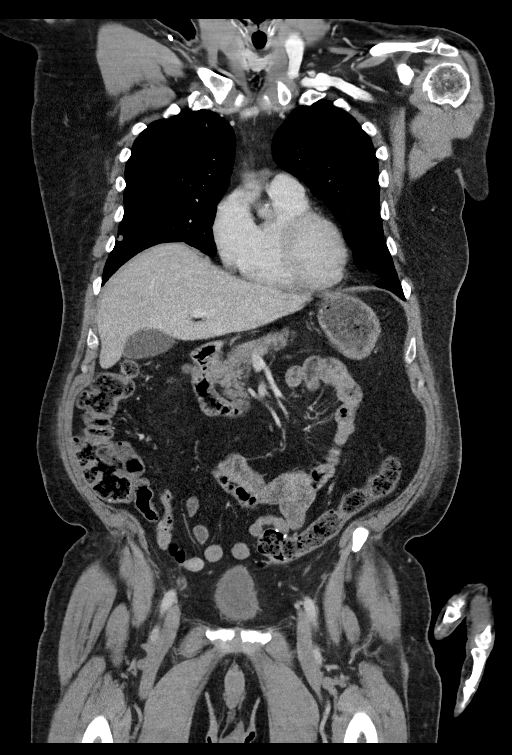
[im 97/162  mediastinal]
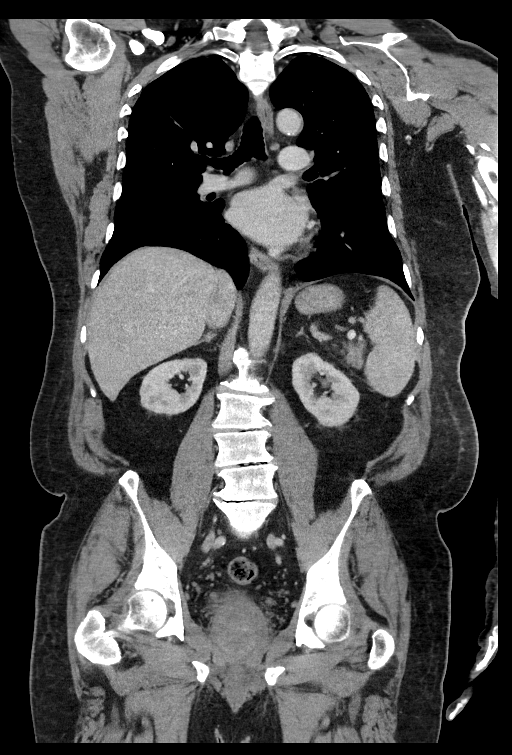

[12 of 36 positions shown; findings below may reference images not displayed]

FINDINGS: CT CHEST FINDINGS

Cardiovascular: Mild aortic atherosclerosis. No acute vascular
findings are seen. Right subclavian Port-A-Cath extends to the upper
SVC. The heart size is normal. There is no pericardial effusion.

Mediastinum/Nodes: There are no enlarged mediastinal, hilar or
axillary lymph nodes. The thyroid gland, trachea and esophagus
demonstrate no significant findings.

Lungs/Pleura: No pleural effusion or pneumothorax. Progressive
enlargement of multiple previously demonstrated pulmonary metastases
bilaterally. For example, there is an 8 mm right upper lobe nodule
on image [DATE] which previously measured 4 mm. A 10 mm left upper
lobe nodule on image 58/6 previously measured 7 mm. Inferior right
upper lobe nodule measuring 12 mm on image 79/6 previously measured
6 mm. There are several new nodules as well.

Musculoskeletal/Chest wall: No chest wall mass or suspicious osseous
findings. Degenerative changes in the spine and both shoulders.

CT ABDOMEN AND PELVIS FINDINGS

Hepatobiliary: There are several new low-density hepatic lesions
which are suspicious for metastatic disease. Subcapsular lesion in
segment 4B measures 1.6 cm on image 62/2. Subcapsular lesions in
segment 6 measure 1.6 cm on image 64/2 and 2.3 x 1.5 cm on image
67/2. Ill-defined low density medially in the dome of the right
hepatic lobe on image 55/2 is probably due to heterogeneous contrast
in the IVC. Small noncalcified gallstones are again noted. No
evidence of gallbladder wall thickening or biliary dilatation.

Pancreas: Unremarkable. No pancreatic ductal dilatation or
surrounding inflammatory changes.

Spleen: Stable 1.3 cm exophytic lesion from the inferior aspect of
the spleen on image 72/2, likely a peritoneal implant based on
previous studies. Otherwise unremarkable.

Adrenals/Urinary Tract: Both adrenal glands appear normal. Both
kidneys appear unremarkable without hydronephrosis or urinary tract
calculus. Previously demonstrated bladder wall thickening and
perivesical soft tissue stranding have improved.

Stomach/Bowel: No enteric contrast administered. The stomach appears
unremarkable for its degree of distension. No evidence of bowel wall
thickening, distention or surrounding inflammatory change. The
appendix appears normal. Sigmoid anastomosis appears patent.

Vascular/Lymphatic: There are no enlarged abdominal or pelvic lymph
nodes. A partially calcified nodule in the false pelvis measuring
1.5 x 1.4 cm on image 102/2 is unchanged. Aortic and branch vessel
atherosclerosis without acute vascular findings or aneurysm. There
is asymmetric mural thrombus within the left common iliac artery
which appears unchanged. The portal, superior mesenteric and splenic
veins are patent.

Reproductive: Stable heterogeneous enlargement of the prostate
gland.

Other: A peritoneal nodule near the splenic flexure of the colon is
mildly enlarged, measuring 1.8 x 1.1 cm on image 76/4. No other
discrete peritoneal nodularity or ascites. By history, left
abdominal surgery with a complex fluid collection in the left
anterior abdominal wall measuring up to 10.6 x 3.4 cm on image 89/2.
There is a small hernia containing fat in this area (image 85/2). No
herniated bowel. There is increased nodularity along the midline
abdominal wall incision, measuring 2.6 x 1.6 cm on image 72/2.

Musculoskeletal: No acute or significant osseous findings.
Multilevel lumbar spondylosis.
IMPRESSION: 1. Progressive multifocal pulmonary metastatic disease.
2. New low-density hepatic lesions, likely metastases.
3. Progressive peritoneal implant near the splenic flexure of the
colon. Small peritoneal implant inferior to the spleen appears
unchanged.
4. Increased soft tissue nodularity near the midline incision in the
upper anterior abdominal wall, suspicious for tumor recurrence at
the incision. This could reflect keloid formation.
5. Fluid collection in the left anterior abdominal wall attributed
to recent abdominal surgery with associated small incisional hernia
containing fat.
6. Additional incidental findings including prostatomegaly,
cholelithiasis and Aortic Atherosclerosis (UAZ2T-TK2.2).

## 2024-01-28 IMAGING — CT CT CHEST W/O CM
2 of 4 series · 14 of 36 positions shown, 17 images · non-contrast
Comparison: CT chest, abdomen and pelvis dated June 02, 2021

CLINICAL DATA: Colon cancer, assess treatment response; * Tracking
Code: BO *



[Series 2: thorax · axial · 0.88mm/px · z∈[-453,-119]mm · 11 of 199 slices shown, 14 images]
[im 16/199  mediastinal]
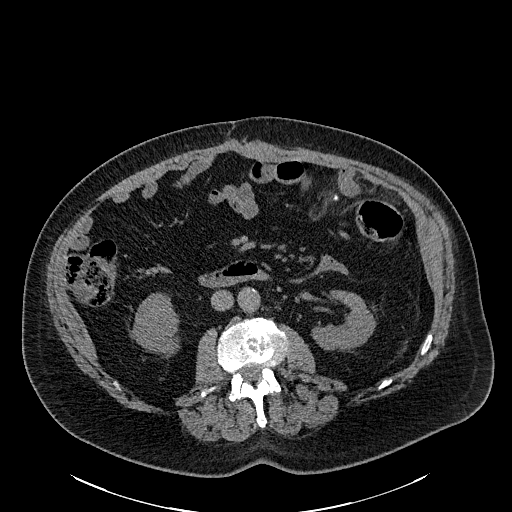
[im 16/199  lung]
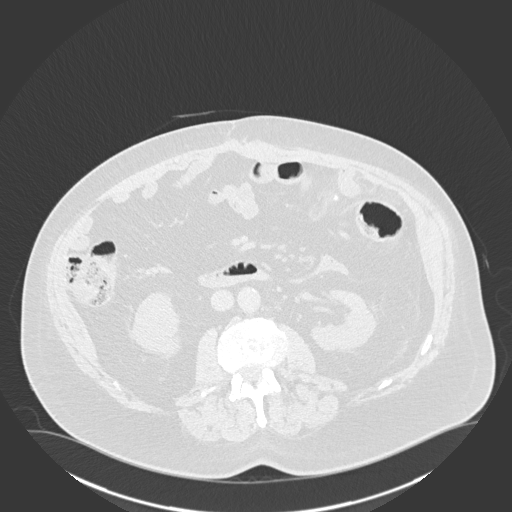
[im 31/199  lung]
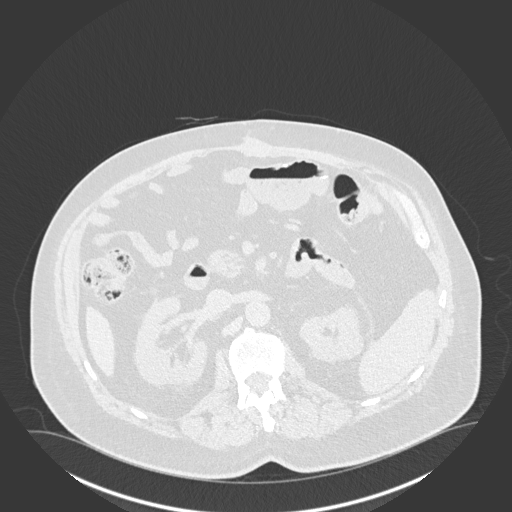
[im 46/199  lung]
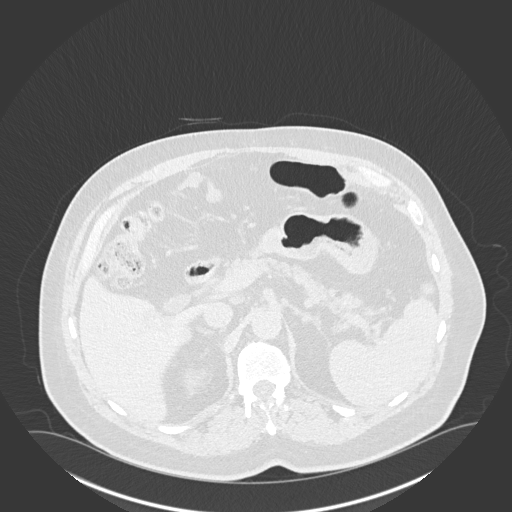
[im 61/199  lung]
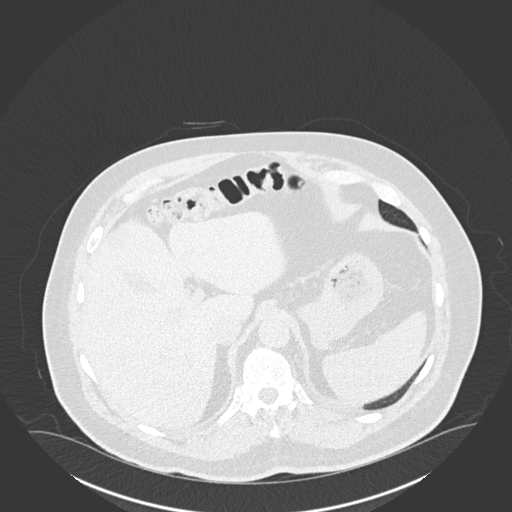
[im 77/199  mediastinal]
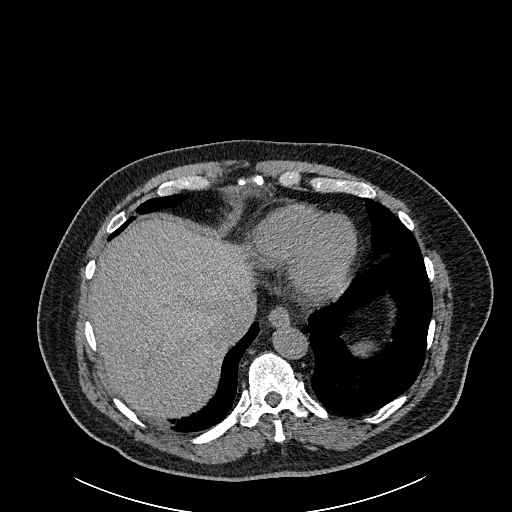
[im 77/199  lung]
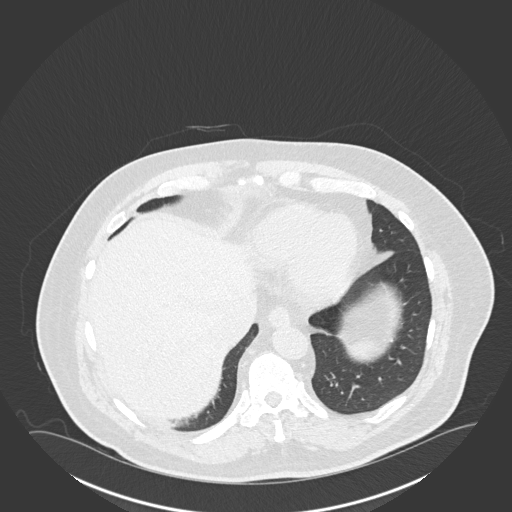
[im 107/199  lung]
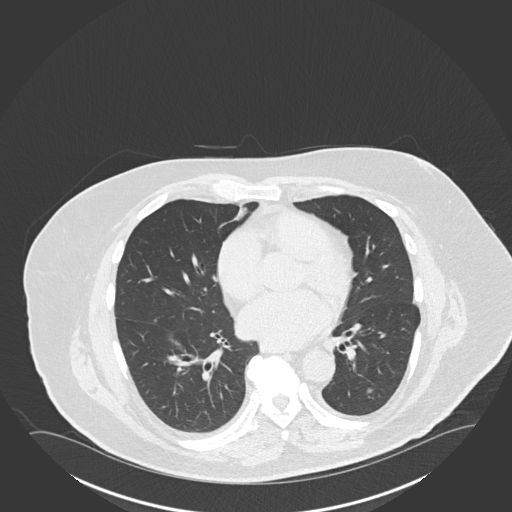
[im 122/199  lung]
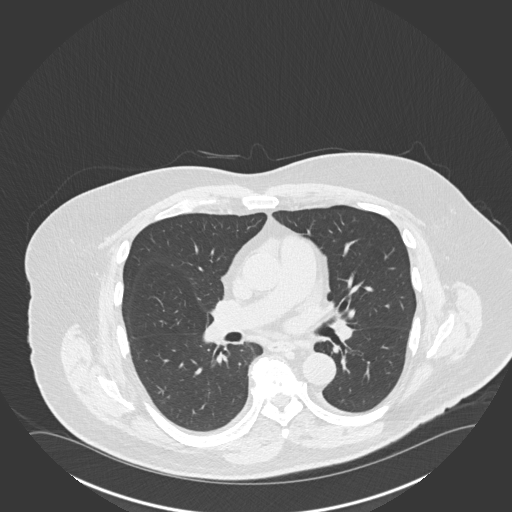
[im 138/199  lung]
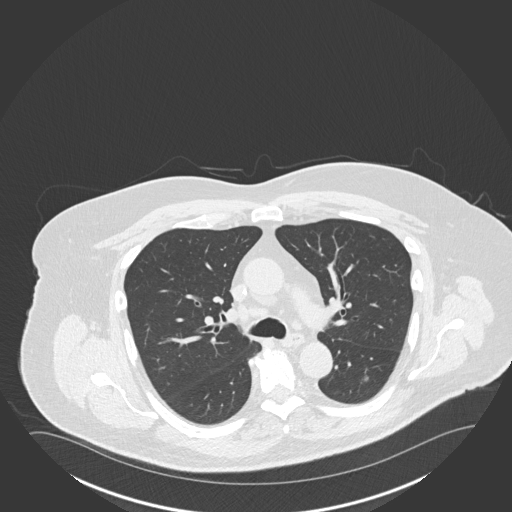
[im 153/199  mediastinal]
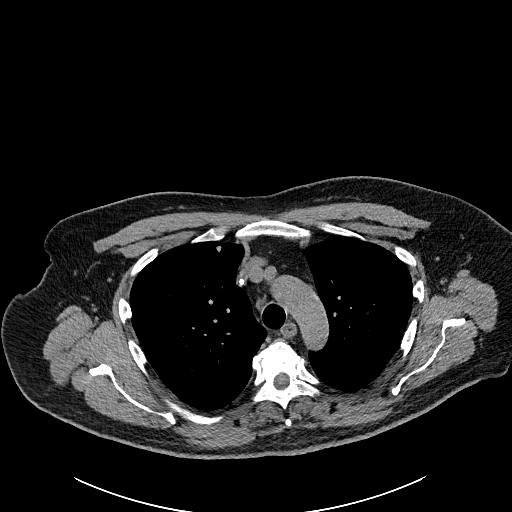
[im 153/199  lung]
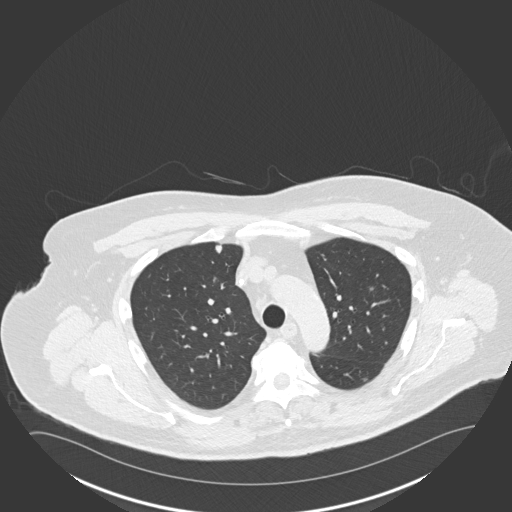
[im 168/199  lung]
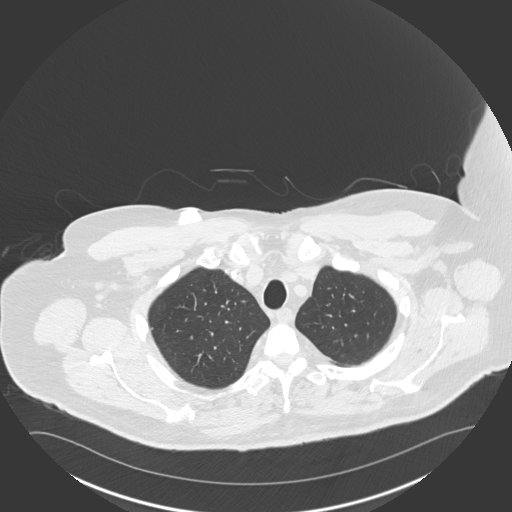
[im 183/199  lung]
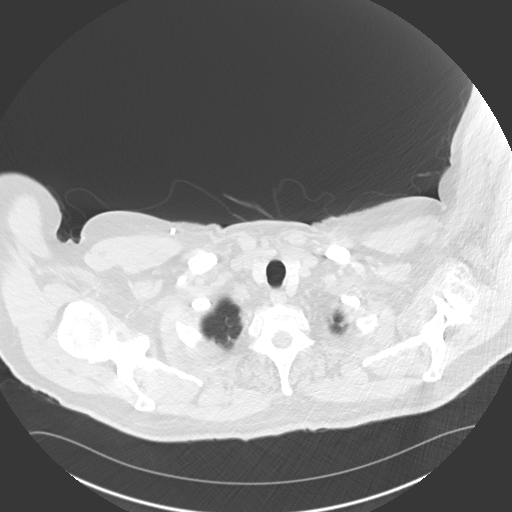

[Series 5: coronal · coronal · 0.76mm/px · 3 of 169 slices shown]
[im 34/169  lung]
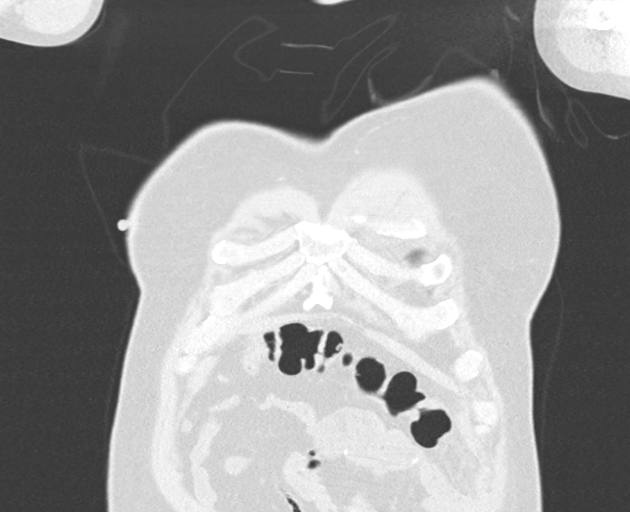
[im 68/169  lung]
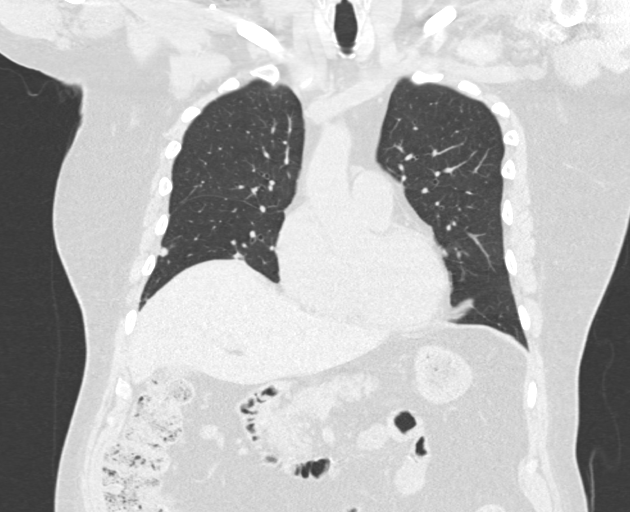
[im 101/169  lung]
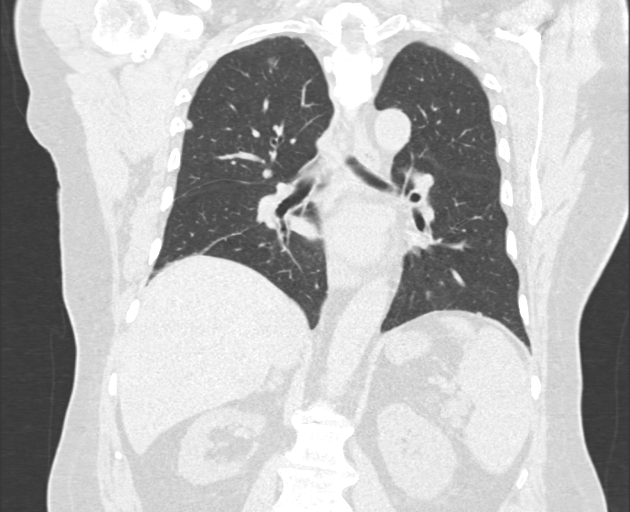

[14 of 36 positions shown; findings below may reference images not displayed]

FINDINGS: Cardiovascular: Normal heart size. No pericardial effusion. Mild
coronary artery calcifications of the LAD. Atherosclerotic disease
of the thoracic aorta.

Mediastinum/Nodes: Esophagus and thyroid are unremarkable. No
pathologically enlarged lymph nodes seen in the chest.

Lungs/Pleura: Central airways are patent. No consolidation, pleural
effusion or pneumothorax. Numerous bilateral solid pulmonary
nodules, are stable when compared to prior exam, although some of
the nodules demonstrate new cavitation. Reference lesion of the
right hepatic lobe measures 9 mm on series 7, image 40 and
demonstrates new cavitation, unchanged in size. Reference lesion of
the right lower lobe measures 9 mm on series 7, image 105, unchanged
in size. Reference lesion of the left upper lobe measures 10 mm on
series 7 image 56 with new cavitation, unchanged in size.

Upper Abdomen: Cholelithiasis. Low-attenuation irregular lesions of
the right hepatic lobe are increased in size. Reference irregular
soft tissue lesion of the inferior right hepatic lobe measuring
x 1.6 cm on series 2, image 153, previously 2.3 x 1.5 cm. Peritoneal
implant located near the splenic flexure of the transverse measuring
1.9 x 1.2 cm on series 2, image 168, unchanged in size.

Musculoskeletal: Interval increased size of soft tissue mass of the
anterior abdominal wall located near the midline incision measuring
3.1 x 2.3 cm on series 2, image 175, previously 1.3 x 2.5 cm.
IMPRESSION: 1. Bilateral solid pulmonary nodules, are stable in size when
compared to prior exam, although some of the nodules demonstrate new
cavitation.
2. Increased size of ill-defined liver lesions.
3. Interval increased size soft tissue mass of the anterior
abdominal wall located near the midline incision.
4. Stable size peritoneal implant located near the splenic flexure
of the colon.
5. Cholelithiasis.
6. Aortic Atherosclerosis ([id number]).

## 2024-05-11 ENCOUNTER — Encounter (HOSPITAL_COMMUNITY): Payer: Self-pay | Admitting: General Surgery
